# Patient Record
Sex: Female | Born: 1935 | Race: White | Hispanic: No | Smoking: Former smoker
Health system: Southern US, Community
[De-identification: ages and names within clinical notes are randomized; demographics above are authoritative.]

## PROBLEM LIST (undated history)

## (undated) DIAGNOSIS — J069 Acute upper respiratory infection, unspecified: Secondary | ICD-10-CM

## (undated) DIAGNOSIS — N764 Abscess of vulva: Secondary | ICD-10-CM

## (undated) DIAGNOSIS — K620 Anal polyp: Secondary | ICD-10-CM

## (undated) DIAGNOSIS — R002 Palpitations: Secondary | ICD-10-CM

## (undated) DIAGNOSIS — K219 Gastro-esophageal reflux disease without esophagitis: Secondary | ICD-10-CM

## (undated) DIAGNOSIS — I10 Essential (primary) hypertension: Secondary | ICD-10-CM

## (undated) DIAGNOSIS — M5030 Other cervical disc degeneration, unspecified cervical region: Secondary | ICD-10-CM

## (undated) DIAGNOSIS — E785 Hyperlipidemia, unspecified: Secondary | ICD-10-CM

## (undated) DIAGNOSIS — D1800 Hemangioma unspecified site: Secondary | ICD-10-CM

## (undated) DIAGNOSIS — R55 Syncope and collapse: Secondary | ICD-10-CM

## (undated) DIAGNOSIS — M8580 Other specified disorders of bone density and structure, unspecified site: Secondary | ICD-10-CM

## (undated) DIAGNOSIS — T7840XA Allergy, unspecified, initial encounter: Secondary | ICD-10-CM

## (undated) DIAGNOSIS — J019 Acute sinusitis, unspecified: Secondary | ICD-10-CM

## (undated) DIAGNOSIS — J029 Acute pharyngitis, unspecified: Secondary | ICD-10-CM

## (undated) DIAGNOSIS — I951 Orthostatic hypotension: Secondary | ICD-10-CM

## (undated) DIAGNOSIS — K621 Rectal polyp: Secondary | ICD-10-CM

## (undated) DIAGNOSIS — M1990 Unspecified osteoarthritis, unspecified site: Secondary | ICD-10-CM

## (undated) HISTORY — DX: Hemangioma unspecified site: D18.00

## (undated) HISTORY — DX: Orthostatic hypotension: I95.1

## (undated) HISTORY — DX: Gastro-esophageal reflux disease without esophagitis: K21.9

## (undated) HISTORY — PX: CATARACT EXTRACTION: SUR2

## (undated) HISTORY — DX: Acute pharyngitis, unspecified: J02.9

## (undated) HISTORY — DX: Anal polyp: K62.0

## (undated) HISTORY — DX: Anal polyp: K62.1

## (undated) HISTORY — PX: ABDOMINAL HYSTERECTOMY: SHX81

## (undated) HISTORY — DX: Allergy, unspecified, initial encounter: T78.40XA

## (undated) HISTORY — DX: Hyperlipidemia, unspecified: E78.5

## (undated) HISTORY — DX: Abscess of vulva: N76.4

## (undated) HISTORY — DX: Other specified disorders of bone density and structure, unspecified site: M85.80

## (undated) HISTORY — DX: Unspecified osteoarthritis, unspecified site: M19.90

## (undated) HISTORY — DX: Palpitations: R00.2

## (undated) HISTORY — DX: Essential (primary) hypertension: I10

## (undated) HISTORY — DX: Acute upper respiratory infection, unspecified: J06.9

## (undated) HISTORY — DX: Other cervical disc degeneration, unspecified cervical region: M50.30

## (undated) HISTORY — PX: TOTAL KNEE ARTHROPLASTY: SHX125

## (undated) HISTORY — PX: OTHER SURGICAL HISTORY: SHX169

## (undated) HISTORY — DX: Syncope and collapse: R55

## (undated) HISTORY — DX: Acute sinusitis, unspecified: J01.90

---

## 1998-09-30 ENCOUNTER — Ambulatory Visit (HOSPITAL_COMMUNITY): Admission: RE | Admit: 1998-09-30 | Discharge: 1998-09-30 | Payer: Self-pay | Admitting: Gastroenterology

## 2000-09-25 ENCOUNTER — Encounter: Admission: RE | Admit: 2000-09-25 | Discharge: 2000-12-24 | Payer: Self-pay | Admitting: Family Medicine

## 2001-12-20 ENCOUNTER — Encounter: Payer: Self-pay | Admitting: Orthopedic Surgery

## 2001-12-20 ENCOUNTER — Encounter: Admission: RE | Admit: 2001-12-20 | Discharge: 2001-12-20 | Payer: Self-pay | Admitting: Orthopedic Surgery

## 2001-12-24 ENCOUNTER — Ambulatory Visit (HOSPITAL_BASED_OUTPATIENT_CLINIC_OR_DEPARTMENT_OTHER): Admission: RE | Admit: 2001-12-24 | Discharge: 2001-12-24 | Payer: Self-pay | Admitting: Orthopedic Surgery

## 2004-04-09 ENCOUNTER — Ambulatory Visit: Payer: Self-pay | Admitting: Family Medicine

## 2004-04-22 ENCOUNTER — Encounter (INDEPENDENT_AMBULATORY_CARE_PROVIDER_SITE_OTHER): Payer: Self-pay | Admitting: Specialist

## 2004-04-22 ENCOUNTER — Ambulatory Visit (HOSPITAL_COMMUNITY): Admission: RE | Admit: 2004-04-22 | Discharge: 2004-04-22 | Payer: Self-pay | Admitting: Gastroenterology

## 2004-04-24 HISTORY — PX: OTHER SURGICAL HISTORY: SHX169

## 2004-05-06 ENCOUNTER — Ambulatory Visit: Payer: Self-pay | Admitting: Family Medicine

## 2004-05-21 ENCOUNTER — Inpatient Hospital Stay (HOSPITAL_COMMUNITY): Admission: EM | Admit: 2004-05-21 | Discharge: 2004-05-24 | Payer: Self-pay | Admitting: Emergency Medicine

## 2004-05-23 ENCOUNTER — Encounter (INDEPENDENT_AMBULATORY_CARE_PROVIDER_SITE_OTHER): Payer: Self-pay | Admitting: *Deleted

## 2004-06-22 LAB — HM DEXA SCAN

## 2004-06-28 ENCOUNTER — Other Ambulatory Visit: Admission: RE | Admit: 2004-06-28 | Discharge: 2004-06-28 | Payer: Self-pay | Admitting: Family Medicine

## 2004-06-28 ENCOUNTER — Ambulatory Visit: Payer: Self-pay | Admitting: Family Medicine

## 2004-09-27 ENCOUNTER — Ambulatory Visit: Payer: Self-pay | Admitting: Family Medicine

## 2004-11-18 ENCOUNTER — Ambulatory Visit: Payer: Self-pay | Admitting: Family Medicine

## 2004-11-22 ENCOUNTER — Encounter: Admission: RE | Admit: 2004-11-22 | Discharge: 2004-11-22 | Payer: Self-pay | Admitting: Family Medicine

## 2004-12-21 ENCOUNTER — Ambulatory Visit: Payer: Self-pay | Admitting: Family Medicine

## 2005-02-21 ENCOUNTER — Ambulatory Visit: Payer: Self-pay | Admitting: Family Medicine

## 2005-03-21 ENCOUNTER — Ambulatory Visit: Payer: Self-pay | Admitting: Family Medicine

## 2005-03-29 ENCOUNTER — Ambulatory Visit: Payer: Self-pay | Admitting: Family Medicine

## 2005-08-02 ENCOUNTER — Ambulatory Visit: Payer: Self-pay | Admitting: Family Medicine

## 2005-08-29 LAB — FECAL OCCULT BLOOD, GUAIAC: Fecal Occult Blood: NEGATIVE

## 2005-08-30 ENCOUNTER — Ambulatory Visit: Payer: Self-pay | Admitting: Family Medicine

## 2005-10-30 ENCOUNTER — Ambulatory Visit: Payer: Self-pay | Admitting: Family Medicine

## 2006-01-11 ENCOUNTER — Ambulatory Visit: Payer: Self-pay | Admitting: Family Medicine

## 2006-02-01 ENCOUNTER — Ambulatory Visit: Payer: Self-pay | Admitting: Family Medicine

## 2006-02-22 DIAGNOSIS — M5030 Other cervical disc degeneration, unspecified cervical region: Secondary | ICD-10-CM

## 2006-02-22 HISTORY — DX: Other cervical disc degeneration, unspecified cervical region: M50.30

## 2006-03-13 ENCOUNTER — Encounter: Admission: RE | Admit: 2006-03-13 | Discharge: 2006-03-13 | Payer: Self-pay | Admitting: Family Medicine

## 2006-03-13 ENCOUNTER — Ambulatory Visit: Payer: Self-pay | Admitting: Family Medicine

## 2006-04-18 ENCOUNTER — Ambulatory Visit: Payer: Self-pay | Admitting: Family Medicine

## 2006-04-19 ENCOUNTER — Ambulatory Visit: Payer: Self-pay | Admitting: Cardiology

## 2006-04-24 DIAGNOSIS — I951 Orthostatic hypotension: Secondary | ICD-10-CM

## 2006-04-24 HISTORY — DX: Orthostatic hypotension: I95.1

## 2006-04-25 ENCOUNTER — Inpatient Hospital Stay (HOSPITAL_COMMUNITY): Admission: EM | Admit: 2006-04-25 | Discharge: 2006-04-27 | Payer: Self-pay | Admitting: Emergency Medicine

## 2006-04-25 ENCOUNTER — Ambulatory Visit: Payer: Self-pay | Admitting: Cardiology

## 2006-04-26 ENCOUNTER — Encounter: Payer: Self-pay | Admitting: Cardiology

## 2006-04-26 ENCOUNTER — Encounter: Payer: Self-pay | Admitting: Vascular Surgery

## 2006-05-07 ENCOUNTER — Ambulatory Visit: Payer: Self-pay | Admitting: Family Medicine

## 2006-05-09 ENCOUNTER — Ambulatory Visit: Payer: Self-pay | Admitting: Cardiology

## 2006-06-18 ENCOUNTER — Ambulatory Visit: Payer: Self-pay | Admitting: Family Medicine

## 2006-07-30 ENCOUNTER — Encounter: Payer: Self-pay | Admitting: Family Medicine

## 2006-07-30 DIAGNOSIS — K621 Rectal polyp: Secondary | ICD-10-CM

## 2006-07-30 DIAGNOSIS — E785 Hyperlipidemia, unspecified: Secondary | ICD-10-CM | POA: Insufficient documentation

## 2006-07-30 DIAGNOSIS — D1800 Hemangioma unspecified site: Secondary | ICD-10-CM | POA: Insufficient documentation

## 2006-07-30 DIAGNOSIS — E119 Type 2 diabetes mellitus without complications: Secondary | ICD-10-CM | POA: Insufficient documentation

## 2006-07-30 DIAGNOSIS — I10 Essential (primary) hypertension: Secondary | ICD-10-CM

## 2006-07-30 DIAGNOSIS — Z8669 Personal history of other diseases of the nervous system and sense organs: Secondary | ICD-10-CM | POA: Insufficient documentation

## 2006-07-30 DIAGNOSIS — M1990 Unspecified osteoarthritis, unspecified site: Secondary | ICD-10-CM

## 2006-07-30 DIAGNOSIS — K219 Gastro-esophageal reflux disease without esophagitis: Secondary | ICD-10-CM | POA: Insufficient documentation

## 2006-07-30 DIAGNOSIS — K620 Anal polyp: Secondary | ICD-10-CM | POA: Insufficient documentation

## 2006-08-08 ENCOUNTER — Ambulatory Visit: Payer: Self-pay | Admitting: Cardiology

## 2006-09-05 ENCOUNTER — Ambulatory Visit: Payer: Self-pay | Admitting: Internal Medicine

## 2006-09-19 ENCOUNTER — Telehealth (INDEPENDENT_AMBULATORY_CARE_PROVIDER_SITE_OTHER): Payer: Self-pay | Admitting: *Deleted

## 2006-09-20 ENCOUNTER — Telehealth: Payer: Self-pay | Admitting: Family Medicine

## 2006-09-28 ENCOUNTER — Ambulatory Visit: Payer: Self-pay | Admitting: Family Medicine

## 2006-10-23 ENCOUNTER — Ambulatory Visit: Payer: Self-pay | Admitting: Family Medicine

## 2007-01-25 ENCOUNTER — Ambulatory Visit: Payer: Self-pay | Admitting: Family Medicine

## 2007-01-29 LAB — CONVERTED CEMR LAB
AST: 18 units/L (ref 0–37)
Albumin: 3.5 g/dL (ref 3.5–5.2)
BUN: 14 mg/dL (ref 6–23)
Chloride: 109 meq/L (ref 96–112)
Cholesterol: 149 mg/dL (ref 0–200)
GFR calc Af Amer: 77 mL/min
HDL: 52.6 mg/dL (ref >39.0)
Hgb A1c MFr Bld: 6.6 % — ABNORMAL HIGH (ref 4.6–6.0)
LDL Cholesterol: 80 mg/dL (ref 0–99)
Total CHOL/HDL Ratio: 2.8
VLDL: 16 mg/dL (ref 0–40)

## 2007-04-15 ENCOUNTER — Telehealth (INDEPENDENT_AMBULATORY_CARE_PROVIDER_SITE_OTHER): Payer: Self-pay | Admitting: *Deleted

## 2007-04-30 ENCOUNTER — Encounter: Payer: Self-pay | Admitting: Family Medicine

## 2007-05-08 ENCOUNTER — Ambulatory Visit: Payer: Self-pay | Admitting: Family Medicine

## 2007-05-11 ENCOUNTER — Telehealth: Payer: Self-pay | Admitting: Internal Medicine

## 2007-05-13 ENCOUNTER — Telehealth: Payer: Self-pay | Admitting: Family Medicine

## 2007-05-16 ENCOUNTER — Ambulatory Visit: Payer: Self-pay | Admitting: Family Medicine

## 2007-05-22 ENCOUNTER — Telehealth: Payer: Self-pay | Admitting: Family Medicine

## 2007-06-11 ENCOUNTER — Encounter: Payer: Self-pay | Admitting: Family Medicine

## 2007-06-20 ENCOUNTER — Ambulatory Visit: Payer: Self-pay | Admitting: Family Medicine

## 2007-06-26 LAB — CONVERTED CEMR LAB
ALT: 18 units/L (ref 0–35)
HDL: 55.6 mg/dL (ref >39.0)
LDL Cholesterol: 76 mg/dL (ref 0–99)
Triglycerides: 63 mg/dL (ref 0–149)
VLDL: 13 mg/dL (ref 0–40)

## 2007-07-12 ENCOUNTER — Encounter: Payer: Self-pay | Admitting: Family Medicine

## 2007-07-25 ENCOUNTER — Ambulatory Visit: Payer: Self-pay | Admitting: Family Medicine

## 2007-12-20 ENCOUNTER — Telehealth: Payer: Self-pay | Admitting: Family Medicine

## 2008-01-14 ENCOUNTER — Ambulatory Visit: Payer: Self-pay | Admitting: Family Medicine

## 2008-01-27 ENCOUNTER — Ambulatory Visit: Payer: Self-pay | Admitting: Family Medicine

## 2008-01-28 ENCOUNTER — Ambulatory Visit: Payer: Self-pay | Admitting: Cardiology

## 2008-01-29 ENCOUNTER — Telehealth: Payer: Self-pay | Admitting: Family Medicine

## 2008-01-29 LAB — CONVERTED CEMR LAB
ALT: 19 units/L (ref 0–35)
AST: 20 units/L (ref 0–37)
BUN: 15 mg/dL (ref 6–23)
Cholesterol: 136 mg/dL (ref 0–200)
Creatinine, Ser: 1 mg/dL (ref 0.4–1.2)
GFR calc Af Amer: 68 mL/min
GFR calc non Af Amer: 56 mL/min
Glucose, Bld: 115 mg/dL — ABNORMAL HIGH (ref 70–99)
Phosphorus: 3.5 mg/dL (ref 2.3–4.6)
Triglycerides: 77 mg/dL (ref 0–149)

## 2008-01-30 ENCOUNTER — Inpatient Hospital Stay (HOSPITAL_COMMUNITY): Admission: EM | Admit: 2008-01-30 | Discharge: 2008-01-31 | Payer: Self-pay | Admitting: Emergency Medicine

## 2008-01-30 ENCOUNTER — Encounter: Payer: Self-pay | Admitting: Family Medicine

## 2008-01-30 ENCOUNTER — Ambulatory Visit: Payer: Self-pay | Admitting: Internal Medicine

## 2008-01-31 ENCOUNTER — Ambulatory Visit: Payer: Self-pay | Admitting: Vascular Surgery

## 2008-01-31 ENCOUNTER — Encounter: Payer: Self-pay | Admitting: Internal Medicine

## 2008-01-31 ENCOUNTER — Encounter: Payer: Self-pay | Admitting: Family Medicine

## 2008-02-27 ENCOUNTER — Telehealth (INDEPENDENT_AMBULATORY_CARE_PROVIDER_SITE_OTHER): Payer: Self-pay | Admitting: *Deleted

## 2008-02-28 ENCOUNTER — Ambulatory Visit: Payer: Self-pay | Admitting: Family Medicine

## 2008-03-10 ENCOUNTER — Ambulatory Visit: Payer: Self-pay | Admitting: Family Medicine

## 2008-03-11 ENCOUNTER — Encounter: Payer: Self-pay | Admitting: Family Medicine

## 2008-04-03 ENCOUNTER — Telehealth: Payer: Self-pay | Admitting: Family Medicine

## 2008-04-10 ENCOUNTER — Telehealth: Payer: Self-pay | Admitting: Family Medicine

## 2008-05-14 ENCOUNTER — Ambulatory Visit: Payer: Self-pay | Admitting: Family Medicine

## 2008-05-14 LAB — CONVERTED CEMR LAB: Rapid Strep: NEGATIVE

## 2008-06-02 ENCOUNTER — Ambulatory Visit: Payer: Self-pay | Admitting: Family Medicine

## 2008-06-02 DIAGNOSIS — J309 Allergic rhinitis, unspecified: Secondary | ICD-10-CM

## 2008-06-15 ENCOUNTER — Encounter: Payer: Self-pay | Admitting: Family Medicine

## 2008-07-07 ENCOUNTER — Ambulatory Visit: Payer: Self-pay | Admitting: Family Medicine

## 2008-07-07 DIAGNOSIS — R002 Palpitations: Secondary | ICD-10-CM | POA: Insufficient documentation

## 2008-07-08 LAB — CONVERTED CEMR LAB
Basophils Absolute: 0 10*3/uL (ref 0.0–0.1)
Basophils Relative: 0.1 % (ref 0.0–3.0)
Eosinophils Absolute: 0.2 10*3/uL (ref 0.0–0.7)
Lymphs Abs: 1.8 10*3/uL (ref 0.7–4.0)
Monocytes Relative: 8.5 % (ref 3.0–12.0)
Neutro Abs: 7.4 10*3/uL (ref 1.4–7.7)
Neutrophils Relative %: 72 % (ref 43.0–77.0)
RBC: 4.44 M/uL (ref 3.87–5.11)

## 2008-07-22 ENCOUNTER — Telehealth (INDEPENDENT_AMBULATORY_CARE_PROVIDER_SITE_OTHER): Payer: Self-pay | Admitting: *Deleted

## 2008-10-01 ENCOUNTER — Ambulatory Visit: Payer: Self-pay | Admitting: Family Medicine

## 2008-10-08 ENCOUNTER — Ambulatory Visit: Payer: Self-pay | Admitting: Family Medicine

## 2008-12-03 ENCOUNTER — Encounter (INDEPENDENT_AMBULATORY_CARE_PROVIDER_SITE_OTHER): Payer: Self-pay | Admitting: *Deleted

## 2008-12-23 ENCOUNTER — Encounter: Payer: Self-pay | Admitting: Family Medicine

## 2009-01-08 ENCOUNTER — Ambulatory Visit: Payer: Self-pay | Admitting: Family Medicine

## 2009-01-21 ENCOUNTER — Encounter: Payer: Self-pay | Admitting: Family Medicine

## 2009-01-22 ENCOUNTER — Ambulatory Visit: Payer: Self-pay | Admitting: Family Medicine

## 2009-01-29 DIAGNOSIS — I951 Orthostatic hypotension: Secondary | ICD-10-CM

## 2009-02-10 ENCOUNTER — Ambulatory Visit: Payer: Self-pay | Admitting: Cardiology

## 2009-02-13 ENCOUNTER — Ambulatory Visit: Payer: Self-pay | Admitting: Cardiology

## 2009-02-13 ENCOUNTER — Inpatient Hospital Stay (HOSPITAL_COMMUNITY): Admission: EM | Admit: 2009-02-13 | Discharge: 2009-02-15 | Payer: Self-pay | Admitting: Emergency Medicine

## 2009-02-15 ENCOUNTER — Ambulatory Visit: Payer: Self-pay | Admitting: Vascular Surgery

## 2009-02-15 ENCOUNTER — Encounter (INDEPENDENT_AMBULATORY_CARE_PROVIDER_SITE_OTHER): Payer: Self-pay | Admitting: Internal Medicine

## 2009-02-26 ENCOUNTER — Ambulatory Visit: Payer: Self-pay | Admitting: Family Medicine

## 2009-02-26 ENCOUNTER — Encounter (INDEPENDENT_AMBULATORY_CARE_PROVIDER_SITE_OTHER): Payer: Self-pay | Admitting: *Deleted

## 2009-02-26 DIAGNOSIS — R413 Other amnesia: Secondary | ICD-10-CM

## 2009-03-02 ENCOUNTER — Encounter: Payer: Self-pay | Admitting: Family Medicine

## 2009-03-11 ENCOUNTER — Ambulatory Visit: Payer: Self-pay | Admitting: Family Medicine

## 2009-04-27 ENCOUNTER — Ambulatory Visit: Payer: Self-pay | Admitting: Family Medicine

## 2009-04-27 LAB — CONVERTED CEMR LAB
AST: 22 units/L (ref 0–37)
Albumin: 3.9 g/dL (ref 3.5–5.2)
BUN: 15 mg/dL (ref 6–23)
Cholesterol: 144 mg/dL (ref 0–200)
GFR calc non Af Amer: 56.1 mL/min (ref >60)
Glucose, Bld: 112 mg/dL — ABNORMAL HIGH (ref 70–99)
HDL: 53.2 mg/dL (ref >39.00)
Hgb A1c MFr Bld: 6.4 % (ref 4.6–6.5)
LDL Cholesterol: 73 mg/dL (ref 0–99)
Potassium: 4.8 meq/L (ref 3.5–5.1)
Total CHOL/HDL Ratio: 3
Total Protein: 7.1 g/dL (ref 6.0–8.3)
Triglycerides: 90 mg/dL (ref 0.0–149.0)

## 2009-04-30 ENCOUNTER — Ambulatory Visit: Payer: Self-pay | Admitting: Family Medicine

## 2009-05-25 ENCOUNTER — Encounter: Payer: Self-pay | Admitting: Family Medicine

## 2009-06-21 ENCOUNTER — Encounter: Payer: Self-pay | Admitting: Family Medicine

## 2009-07-27 ENCOUNTER — Ambulatory Visit: Payer: Self-pay | Admitting: Family Medicine

## 2009-07-30 ENCOUNTER — Ambulatory Visit: Payer: Self-pay | Admitting: Family Medicine

## 2009-08-06 ENCOUNTER — Telehealth (INDEPENDENT_AMBULATORY_CARE_PROVIDER_SITE_OTHER): Payer: Self-pay | Admitting: *Deleted

## 2009-10-19 ENCOUNTER — Ambulatory Visit: Payer: Self-pay | Admitting: Family Medicine

## 2009-10-26 LAB — CONVERTED CEMR LAB
ALT: 16 units/L (ref 0–35)
Albumin: 3.8 g/dL (ref 3.5–5.2)
Alkaline Phosphatase: 52 units/L (ref 39–117)
BUN: 18 mg/dL (ref 6–23)
Basophils Relative: 0.3 % (ref 0.0–3.0)
CO2: 29 meq/L (ref 19–32)
Calcium: 9.3 mg/dL (ref 8.4–10.5)
Creatinine,U: 113.8 mg/dL
Eosinophils Absolute: 0.2 10*3/uL (ref 0.0–0.7)
Eosinophils Relative: 3.4 % (ref 0.0–5.0)
GFR calc non Af Amer: 53.56 mL/min (ref >60)
Lymphocytes Relative: 21.8 % (ref 12.0–46.0)
Microalb, Ur: 3.7 mg/dL — ABNORMAL HIGH (ref 0.0–1.9)
Monocytes Relative: 9.6 % (ref 3.0–12.0)
Neutro Abs: 4.8 10*3/uL (ref 1.4–7.7)
Neutrophils Relative %: 64.9 % (ref 43.0–77.0)
RDW: 13.9 % (ref 11.5–14.6)
Total Bilirubin: 0.5 mg/dL (ref 0.3–1.2)
Total CHOL/HDL Ratio: 3

## 2009-10-29 ENCOUNTER — Ambulatory Visit: Payer: Self-pay | Admitting: Family Medicine

## 2009-10-29 DIAGNOSIS — M810 Age-related osteoporosis without current pathological fracture: Secondary | ICD-10-CM

## 2009-11-04 ENCOUNTER — Encounter: Payer: Self-pay | Admitting: Family Medicine

## 2009-11-12 ENCOUNTER — Ambulatory Visit: Payer: Self-pay | Admitting: Family Medicine

## 2009-11-12 LAB — CONVERTED CEMR LAB
Alkaline Phosphatase: 48 units/L (ref 39–117)
BUN: 18 mg/dL (ref 6–23)
Calcium: 9.3 mg/dL (ref 8.4–10.5)
Chloride: 113 meq/L — ABNORMAL HIGH (ref 96–112)
Creatinine,U: 120.9 mg/dL
LDL Cholesterol: 68 mg/dL (ref 0–99)
Potassium: 4.4 meq/L (ref 3.5–5.1)
Sodium: 144 meq/L (ref 135–145)
Total Bilirubin: 0.8 mg/dL (ref 0.3–1.2)
Total Protein: 6.7 g/dL (ref 6.0–8.3)
Triglycerides: 84 mg/dL (ref 0.0–149.0)
VLDL: 16.8 mg/dL (ref 0.0–40.0)

## 2009-11-12 LAB — HM MAMMOGRAPHY: HM Mammogram: NORMAL

## 2009-11-13 ENCOUNTER — Encounter: Payer: Self-pay | Admitting: Family Medicine

## 2009-11-26 ENCOUNTER — Ambulatory Visit: Payer: Self-pay | Admitting: Family Medicine

## 2009-12-08 ENCOUNTER — Telehealth: Payer: Self-pay | Admitting: Family Medicine

## 2009-12-10 ENCOUNTER — Telehealth (INDEPENDENT_AMBULATORY_CARE_PROVIDER_SITE_OTHER): Payer: Self-pay | Admitting: *Deleted

## 2009-12-13 ENCOUNTER — Ambulatory Visit: Payer: Self-pay | Admitting: Family Medicine

## 2009-12-13 DIAGNOSIS — M1A9XX0 Chronic gout, unspecified, without tophus (tophi): Secondary | ICD-10-CM

## 2010-02-09 ENCOUNTER — Ambulatory Visit: Payer: Self-pay | Admitting: Family Medicine

## 2010-03-02 ENCOUNTER — Ambulatory Visit: Payer: Self-pay | Admitting: Family Medicine

## 2010-03-04 ENCOUNTER — Ambulatory Visit: Payer: Self-pay | Admitting: Family Medicine

## 2010-04-04 ENCOUNTER — Ambulatory Visit: Payer: Self-pay | Admitting: Family Medicine

## 2010-04-20 ENCOUNTER — Telehealth: Payer: Self-pay | Admitting: Family Medicine

## 2010-04-28 ENCOUNTER — Other Ambulatory Visit: Payer: Self-pay | Admitting: Family Medicine

## 2010-04-28 ENCOUNTER — Ambulatory Visit
Admission: RE | Admit: 2010-04-28 | Discharge: 2010-04-28 | Payer: Self-pay | Source: Home / Self Care | Attending: Family Medicine | Admitting: Family Medicine

## 2010-04-28 LAB — HEMOGLOBIN A1C: Hgb A1c MFr Bld: 6.7 % — ABNORMAL HIGH (ref 4.6–6.5)

## 2010-05-10 ENCOUNTER — Ambulatory Visit: Admit: 2010-05-10 | Payer: Self-pay | Admitting: Family Medicine

## 2010-05-24 ENCOUNTER — Ambulatory Visit
Admission: RE | Admit: 2010-05-24 | Discharge: 2010-05-24 | Payer: Self-pay | Source: Home / Self Care | Attending: Family Medicine | Admitting: Family Medicine

## 2010-05-24 NOTE — Assessment & Plan Note (Signed)
Summary: CHECK LESION ON ABD   Vital Signs:  Patient profile:   77 year old female Height:      66 inches Weight:      144.0 pounds BMI:     23.33 Temp:     98.0 degrees F oral Pulse rate:   72 / minute Pulse rhythm:   regular BP sitting:   140 / 72  (left arm) Cuff size:   regular  Vitals Entered By: Benny Lennert CMA Duncan Dull) (March 02, 2010 3:12 PM)  History of Present Illness: Chief complaint Check lesion on abdomen  In last 8-9 years noted lesion on left lower stomache.  Over the last few days noted redness around site, firmness under leion.  No itch, no bleeding, no pain.    Problems Prior to Update: 1)  Neck Pain  (ICD-723.1) 2)  Gout, Unspecified  (ICD-274.9) 3)  Altered Mental Status  (ICD-780.97) 4)  Preoperative Examination  (ICD-V72.84) 5)  Osteoporosis  (ICD-733.00) 6)  Other Screening Mammogram  (ICD-V76.12) 7)  Memory Loss  (ICD-780.93) 8)  Syncope/ 1 Episode  (ICD-780.2) 9)  Hypotension, Orthostatic  (ICD-458.0) 10)  Palpitations, Occasional  (ICD-785.1) 11)  Hyperlipidemia  (ICD-272.4) 12)  Hypertension  (ICD-401.9) 13)  Diabetes Mellitus, Type II  (ICD-250.00) 14)  Gerd  (ICD-530.81) 15)  Allergic Rhinitis  (ICD-477.9) 16)  Pharyngitis  (ICD-462) 17)  Other Abscess of Vulva  (ICD-616.4) 18)  Sinusitis- Acute-nos  (ICD-461.9) 19)  Uri  (ICD-465.9) 20)  Hx of Hemangioma  (ICD-228.00) 21)  Rectal Polyps  (ICD-569.0) 22)  Cataract, Hx of  (ICD-V12.40) 23)  Osteoarthritis  (ICD-715.90) 24)  Gout  (ICD-274.9)  Current Medications (verified): 1)  Zocor 40 Mg  Tabs (Simvastatin) .Marland Kitchen.. 1 By Mouth Once Daily 2)  Altace 2.5 Mg Caps (Ramipril) .... Take One By Mouth Daily 3)  Glyburide 2.5 Mg Tabs (Glyburide) .... Take One By Mouth Twice Daily As Directed- As Needed If Glucose Is Over 200 4)  Bayer Aspirin 325 Mg  Tabs (Aspirin) .... 1/2 Tab Every Day 5)  Omeprazole 20 Mg Cpdr (Omeprazole) .... 2 Tabs By Mouth Dialy 6)  Milk of Magnesia .... Take 1  Tablet By Mouth Once A Day, 3-4 Times Per Week. 7)  Alendronate Sodium 70 Mg Tabs (Alendronate Sodium) .Marland Kitchen.. 1 Tab By Mouth Weekly 8)  Keppra 250 Mg Tabs (Levetiracetam) .Marland Kitchen.. 1 Tab By Mouth Two Times A Day X 6 Months  Allergies: 1)  ! Codeine 2)  ! Crestor 3)  ! Lipitor  Past History:  Past medical, surgical, family and social histories (including risk factors) reviewed, and no changes noted (except as noted below).  Past Medical History: Reviewed history from 01/29/2009 and no changes required. SYNCOPE/ 1 EPISODE (ICD-780.2) HYPOTENSION, ORTHOSTATIC (ICD-458.0) PALPITATIONS, OCCASIONAL (ICD-785.1) HYPERLIPIDEMIA (ICD-272.4) HYPERTENSION (ICD-401.9) DIABETES MELLITUS, TYPE II (ICD-250.00) GERD (ICD-530.81) ALLERGIC RHINITIS (ICD-477.9) PHARYNGITIS (ICD-462) OTHER ABSCESS OF VULVA (ICD-616.4) SINUSITIS- ACUTE-NOS (ICD-461.9) URI (ICD-465.9) Hx of HEMANGIOMA (ICD-228.00) RECTAL POLYPS (ICD-569.0) CATARACT, HX OF (ICD-V12.40) OSTEOPENIA (ICD-733.90) OSTEOARTHRITIS (ICD-715.90) GOUT (ICD-274.9)    Past Surgical History: Reviewed history from 07/27/2007 and no changes required. Hysterectomy Cataract extraction Heel dexa- normal (2003) Colonoscopy- (2001), rectal polyps (04/2004) Hospital- cavernous hemangioma (04/2004) 2D Echo EF 55-65% (04/2004) Dexa- osteopenia T-1.2 (06/2004) Cavernous angiooma Cervical deg. changes (02/2006) Hospital- pre syncope- orthostatic hypertension (04/2006) Aortic dopplers- mild plaque.  EEG o.k.   D Echo- normal.  EF 55-60% MRI of head- stable R TKR 5/08 colonoscopy 3/09 polyp and diverticulosis  Family  History: Reviewed history from 01/29/2009 and no changes required. M aunt breast ca Mother deceased at age 86, unknown cause.  Father deceased at age 45 secondary to CVA.  Four siblings deceased from CHF and old age.  Social History: Reviewed history from 01/29/2009 and no changes required. 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  Denies ever using tobacco, EtOH, drugs or herbal  medication.  No diet restrictions.  Activity limited secondary to knee discomfort/arthritis.  Review of Systems General:  Denies fatigue and fever. CV:  Denies chest pain or discomfort. Resp:  Denies shortness of breath.  Physical Exam  General:  Well-developed,well-nourished,in no acute distress; alert,appropriate and cooperative throughout examination Mouth:  MMM Lungs:  Normal respiratory effort, chest expands symmetrically. Lungs are clear to auscultation, no crackles or wheezes. Heart:  Normal rate and regular rhythm. S1 and S2 normal without gallop, murmur, click, rub or other extra sounds. Skin:  sebaceous core with underlying cyst, surrounding erythema...removed core with scapel, culture taken, pus discharged.    Impression & Recommendations:  Problem # 1:  SEBACEOUS CYST, INFECTED (ICD-706.2)  Warm compresses 2-3 times daily.  Apply bandiad with  neopsrin. Take 7 day course of keflex.  Follow up in 2 days.   Orders: T-Culture, Wound ([id number])  Complete Medication List: 1)  Zocor 40 Mg Tabs (Simvastatin) .Marland Kitchen.. 1 by mouth once daily 2)  Altace 2.5 Mg Caps (Ramipril) .... Take one by mouth daily 3)  Glyburide 2.5 Mg Tabs (Glyburide) .... Take one by mouth twice daily as directed- as needed if glucose is over 200 4)  Bayer Aspirin 325 Mg Tabs (Aspirin) .... 1/2 tab every day 5)  Omeprazole 20 Mg Cpdr (Omeprazole) .... 2 tabs by mouth dialy 6)  Milk of Magnesia  .... Take 1 tablet by mouth once a day, 3-4 times per week. 7)  Alendronate Sodium 70 Mg Tabs (Alendronate sodium) .Marland Kitchen.. 1 tab by mouth weekly 8)  Keppra 250 Mg Tabs (Levetiracetam) .Marland Kitchen.. 1 tab by mouth two times a day x 6 months 9)  Cephalexin 500 Mg Caps (Cephalexin) .Marland Kitchen.. 1 tab by mouth three times a day x 7 days  Patient Instructions: 1)  Warm compresses 2-3 times daily. 2)   Apply bandiad  with  neopsrin. 3)   Take 7 day course of keflex.  4)  Follow up in 2 days...add on. .  Prescriptions: CEPHALEXIN 500 MG CAPS (CEPHALEXIN) 1 tab by mouth three times a day x 7 days  #21 x 0   Entered and Authorized by:   Kerby Nora MD   Signed by:   Kerby Nora MD on 03/02/2010   Method used:   Electronically to        [facility]* (retail)       [address] [place], [place]  [address]       Ph: [phone] or [phone]       Fax: [phone]   RxID:   [id number]    Orders Added: 1)  Est. Patient Level III [03474] 2)  T-Culture, Wound [[id number]]    Current Allergies (reviewed today): ! CODEINE ! CRESTOR ! LIPITOR

## 2010-05-24 NOTE — Progress Notes (Signed)
Summary: wants something for gout flare up  Phone Note Call from Patient   Caller: Patient Call For: Kerby Nora MD Summary of Call: Pt states she woke up with gout flare up in right wrist this morning.  She is asking if something can be called to [facility].  She is going to take tylenol or advil for the pain. Initial call taken by: Lowella Petties CMA,  December 08, 2009 8:28 AM  Follow-up for Phone Call        Call if not improving.  Follow-up by: Kerby Nora MD,  December 08, 2009 3:24 PM  Additional Follow-up for Phone Call Additional follow up Details #1::        Patient advised.Consuello Masse CMA   Additional Follow-up by: Benny Lennert CMA Duncan Dull),  December 08, 2009 3:37 PM    New/Updated Medications: COLCRYS 0.6 MG TABS (COLCHICINE) 2 tab by mouth x1 , then 1 tab by mouth every 2 hours until pain gone, diarrhea or reach max of 10 in 24 hours Prescriptions: COLCRYS 0.6 MG TABS (COLCHICINE) 2 tab by mouth x1 , then 1 tab by mouth every 2 hours until pain gone, diarrhea or reach max of 10 in 24 hours  #10 x 0   Entered and Authorized by:   Kerby Nora MD   Signed by:   Kerby Nora MD on 12/08/2009   Method used:   Electronically to        [facility]* (retail)       [address] [place], [place]  [address]       Ph: [phone] or [phone]       Fax: [phone]   RxID:   [id number]

## 2010-05-24 NOTE — Letter (Signed)
Summary: [facility]  [facility]   Imported By: Lanelle Bal 05/31/2009 12:10:09  _____________________________________________________________________  External Attachment:    Type:   Image     Comment:   External Document

## 2010-05-24 NOTE — Assessment & Plan Note (Signed)
Summary: SURGICAL CLEARANCE FOR KNEE REPLACEMENT   Vital Signs:  Patient profile:   77 year old female Height:      66 inches Weight:      146.6 pounds BMI:     23.75 Temp:     98.4 degrees F oral Pulse rate:   76 / minute Pulse rhythm:   regular BP sitting:   130 / 80  (left arm) Cuff size:   regular  Vitals Entered By: Benny Lennert CMA Duncan Dull) (November 26, 2009 9:57 AM)  History of Present Illness: Chief complaint surgical clearence  Seeing Dr. Valentina Gu for left knee pain..Pt already notified per report.  severe OA in right knee. She wants myopinion on whether she would do well with surgery.  Pain in knee is intermitttant but not severe everyday.  In last month..she had a spell where she sat through a light ..lady came out of care to tell her it had changed twice.  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  Past syncopal events/blanking out spells. Neg cards eval/EKG.  neg neuro eval... EEG nml...Dr. Pearlean Brownie. Still with intermittant spells of "feeling like she cannot get her breath/palpitations"..seems to occur when leaning over. CBC and Tyroid NML 01/2009..MRI.MRA...no acute infact  MRA neck...no carotid or vertebral stenosis  ECHo showed nml EF, no clear embolic source.    Has hx if knee replacement, right   Last surgery 4 years ago, no complications.   Problems Prior to Update: 1)  Osteoporosis  (ICD-733.00) 2)  Other Screening Mammogram  (ICD-V76.12) 3)  Memory Loss  (ICD-780.93) 4)  Syncope/ 1 Episode  (ICD-780.2) 5)  Hypotension, Orthostatic  (ICD-458.0) 6)  Palpitations, Occasional  (ICD-785.1) 7)  Hyperlipidemia  (ICD-272.4) 8)  Hypertension  (ICD-401.9) 9)  Diabetes Mellitus, Type II  (ICD-250.00) 10)  Gerd  (ICD-530.81) 11)  Allergic Rhinitis  (ICD-477.9) 12)  Pharyngitis  (ICD-462) 13)  Other Abscess of Vulva  (ICD-616.4) 14)  Sinusitis- Acute-nos  (ICD-461.9) 15)  Uri  (ICD-465.9) 16)  Hx of  Hemangioma  (ICD-228.00) 17)  Rectal Polyps  (ICD-569.0) 18)  Cataract, Hx of  (ICD-V12.40) 19)  Osteoarthritis  (ICD-715.90) 20)  Gout  (ICD-274.9)  Current Medications (verified): 1)  Zocor 40 Mg  Tabs (Simvastatin) .Marland Kitchen.. 1 By Mouth Once Daily 2)  Altace 2.5 Mg Caps (Ramipril) .... Take One By Mouth Daily 3)  Glyburide 2.5 Mg Tabs (Glyburide) .... Take One By Mouth Twice Daily As Directed- As Needed If Glucose Is Over 200 4)  Bayer Aspirin 325 Mg  Tabs (Aspirin) .... 1/2 Tab Every Day 5)  Omeprazole 20 Mg Cpdr (Omeprazole) .... 2 Tabs By Mouth Dialy 6)  Milk of Magnesia .... Take 1 Tablet By Mouth Once A Day, 3-4 Times Per Week. 7)  Meloxicam 7.5 Mg Tabs (Meloxicam) .Marland Kitchen.. 1-2 Tab By Mouth Daily As Needed Ankle Pain 8)  Alendronate Sodium 70 Mg Tabs (Alendronate Sodium) .Marland Kitchen.. 1 Tab By Mouth Weekly  Allergies: 1)  ! Codeine 2)  ! Crestor 3)  ! Lipitor  Past History:  Past medical, surgical, family and social histories (including risk factors) reviewed, and no changes noted (except as noted below).  Past Medical History: Reviewed history from 01/29/2009 and no changes required. SYNCOPE/ 1 EPISODE (ICD-780.2) HYPOTENSION, ORTHOSTATIC (ICD-458.0) PALPITATIONS, OCCASIONAL (ICD-785.1) HYPERLIPIDEMIA (ICD-272.4) HYPERTENSION (ICD-401.9) DIABETES MELLITUS, TYPE II (ICD-250.00) GERD (ICD-530.81) ALLERGIC RHINITIS (ICD-477.9) PHARYNGITIS (ICD-462) OTHER ABSCESS OF VULVA (ICD-616.4) SINUSITIS- ACUTE-NOS (ICD-461.9) URI (  ICD-465.9) Hx of HEMANGIOMA (ICD-228.00) RECTAL POLYPS (ICD-569.0) CATARACT, HX OF (ICD-V12.40) OSTEOPENIA (ICD-733.90) OSTEOARTHRITIS (ICD-715.90) GOUT (ICD-274.9)    Past Surgical History: Reviewed history from 07/27/2007 and no changes required. Hysterectomy Cataract extraction Heel dexa- normal (2003) Colonoscopy- (2001), rectal polyps (04/2004) Hospital- cavernous hemangioma (04/2004) 2D Echo EF 55-65% (04/2004) Dexa- osteopenia T-1.2  (06/2004) Cavernous angiooma Cervical deg. changes (02/2006) Hospital- pre syncope- orthostatic hypertension (04/2006) Aortic dopplers- mild plaque.  EEG o.k.   D Echo- normal.  EF 55-60% MRI of head- stable R TKR 5/08 colonoscopy 3/09 polyp and diverticulosis  Family History: Reviewed history from 01/29/2009 and no changes required. M aunt breast ca Mother deceased at age 37, unknown cause.  Father deceased at age 7 secondary to CVA.  Four siblings deceased from CHF and old age.  Social History: Reviewed history from 01/29/2009 and no changes required. 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  Denies ever using tobacco, EtOH, drugs or herbal  medication.  No diet restrictions.  Activity limited secondary to knee discomfort/arthritis.  Review of Systems General:  Denies fatigue and fever. GI:  Denies abdominal pain. GU:  Denies dysuria. Derm:  Denies rash. Psych:  Denies anxiety and depression.  Physical Exam  General:  Well-developed,well-nourished,in no acute distress; alert,appropriate and cooperative throughout examination Mouth:  Oral mucosa and oropharynx without lesions or exudates.  Teeth in good repair. Neck:  no carotid bruit or thyromegaly no cervical or supraclavicular lymphadenopathy  Lungs:  Normal respiratory effort, chest expands symmetrically. Lungs are clear to auscultation, no crackles or wheezes. Heart:  Normal rate and regular rhythm. S1 and S2 normal without gallop, murmur, click, rub or other extra sounds. Pulses:  R and L posterior tibial pulses are full and equal bilaterally  Extremities:  no edema  Diabetes Management Exam:    Foot Exam (with socks and/or shoes not present):       Sensory-Pinprick/Light touch:          Left medial foot (L-4): normal          Left dorsal foot (L-5): normal          Left lateral foot (S-1): normal          Right medial foot (L-4): normal          Right  dorsal foot (L-5): normal          Right lateral foot (S-1): normal       Sensory-Monofilament:          Left foot: normal          Right foot: normal       Inspection:          Left foot: normal          Right foot: normal       Nails:          Left foot: normal          Right foot: normal   Impression & Recommendations:  Problem # 1:  PREOPERATIVE EXAMINATION (ICD-V72.84) Needs cardiovascular clearance from her cardiologist...may not need to be seen since recent evaluation/EKG. I discussed concering starting episodes with her Neurologist in detail as well. ...detailed below. recent labs nml..would recommend preop CXR as well.  Will forward recent CPX and labs to Cardiologist.   Problem # 2:  ALTERED MENTAL STATUS (ICD-780.97) Staring spells continuing.Marland Kitchenabout 1-2 times per month. need to contact her NEuro to discuss concern with upcoming  surgery.   Discussed concern of TIA vs complex partial seizures in detail with Dr. Pearlean Brownie on 8/16.  EEG neg, but can be neg and still have seizure do.  He recommended starting Keprra two times a day...following to fdetermine if spells decreased. He requested that we have her follow up in next few weeks.  He did not feel her ongoing issues we a contraindication to knee replacement surgery.   Problem # 3:  HYPERTENSION (ICD-401.9)  Well controlled. Continue current medication.  Her updated medication list for this problem includes:    Altace 2.5 Mg Caps (Ramipril) .Marland Kitchen... Take one by mouth daily  BP today: 130/80 Prior BP: 116/80 (11/12/2009)  Prior 10 Yr Risk Heart Disease: 13 % (04/30/2009)  Labs Reviewed: K+: 4.4 (10/19/2009) Creat: : 1.0 (10/19/2009)   Chol: 140 (10/19/2009)   HDL: 46.00 (10/19/2009)   LDL: 78 (10/19/2009)   TG: 82.0 (10/19/2009)  Problem # 4:  DIABETES MELLITUS, TYPE II (ICD-250.00) Well controlled. Continue current medication.  Her updated medication list for this problem includes:    Altace 2.5 Mg Caps (Ramipril)  .Marland Kitchen... Take one by mouth daily    Glyburide 2.5 Mg Tabs (Glyburide) .Marland Kitchen... Take one by mouth twice daily as directed- as needed if glucose is over 200    Bayer Aspirin 325 Mg Tabs (Aspirin) .Marland Kitchen... 1/2 tab every day  Problem # 5:  SYNCOPE/ 1 EPISODE (ICD-780.2) Assessment: Improved no furter episodes.   Complete Medication List: 1)  Zocor 40 Mg Tabs (Simvastatin) .Marland Kitchen.. 1 by mouth once daily 2)  Altace 2.5 Mg Caps (Ramipril) .... Take one by mouth daily 3)  Glyburide 2.5 Mg Tabs (Glyburide) .... Take one by mouth twice daily as directed- as needed if glucose is over 200 4)  Bayer Aspirin 325 Mg Tabs (Aspirin) .... 1/2 tab every day 5)  Omeprazole 20 Mg Cpdr (Omeprazole) .... 2 tabs by mouth dialy 6)  Milk of Magnesia  .... Take 1 tablet by mouth once a day, 3-4 times per week. 7)  Meloxicam 7.5 Mg Tabs (Meloxicam) .Marland Kitchen.. 1-2 tab by mouth daily as needed ankle pain 8)  Alendronate Sodium 70 Mg Tabs (Alendronate sodium) .Marland Kitchen.. 1 tab by mouth weekly  Patient Instructions: 1)  We will contact you regarding our discussion with neurologist and ORTHO.   Current Allergies (reviewed today): ! CODEINE ! CRESTOR ! LIPITOR  Appended Document: Orders Update Medications Added KEPPRA 250 MG TABS (LEVETIRACETAM) 1 tab by mouth two times a day x 6 months       Discussed with pt in detail Dr. Marlis Edelson recommendations of starting Keprra 250 mg two times a day x 6 months. Sent to pharm.  She will call to make a follow up appt with Dr. Pearlean Brownie in next 1-2 months.   She plans on holidng off on knee surgery for the time being given her knee is pain free.  If she does decides to move forward with the procedure..Dr. Pearlean Brownie feels she is okay to procede from a neuro standpoint.  Also she has had eval with Dr. Daleen Squibb in last 6 months.Marland KitchenMarland KitchenI asked her to call to make sure he felt she was clear for surgery from CV standpoint prior to proceeding; but she has not had any past CV issues.Marland Kitchenso I believe she will be clear.  Otherwise  other health issues stable...and otherwise clear for surgery.   Please forward this OV note and this addendum to Dr. Valentina Gu, [facility]ih.  Kerby Nora MD  December 07, 2009 5:37 PM  Clinical Lists Changes  Medications: Added new medication of KEPPRA 250 MG TABS (LEVETIRACETAM) 1 tab by mouth two times a day x 6 months - Signed Rx of KEPPRA 250 MG TABS (LEVETIRACETAM) 1 tab by mouth two times a day x 6 months;  #60 x 5;  Signed;  Entered by: Kerby Nora MD;  Authorized by: Kerby Nora MD;  Method used: Electronically to [facility]*, [address] [place], [place]  [address], Ph: [phone] or [phone], Fax: [phone]    Prescriptions: KEPPRA 250 MG TABS (LEVETIRACETAM) 1 tab by mouth two times a day x 6 months  #60 x 5   Entered and Authorized by:   Kerby Nora MD   Signed by:   Kerby Nora MD on 12/07/2009   Method used:   Electronically to        [facility]* (retail)       [address] [place], [place]  [address]       Ph: [phone] or [phone]       Fax: [phone]   RxID:   [id number]   Faxed to dr Hebert Soho office.Consuello Masse CMA

## 2010-05-24 NOTE — Assessment & Plan Note (Signed)
Summary: 2 DAY F/U DLO   Vital Signs:  Patient profile:   77 year old female Height:      66 inches Weight:      143.0 pounds BMI:     23.16 Temp:     97.8 degrees F oral Pulse rate:   72 / minute Pulse rhythm:   regular BP sitting:   110 / 70  (left arm) Cuff size:   regular  Vitals Entered By: Benny Lennert CMA Duncan Dull) (March 04, 2010 12:04 PM)  History of Present Illness: Chief complaint 2 day follow up   She returns for 2 day follow up on infected sebaceous cyst on left lower abdomen..on day 2 of keflex. Doing warm soaks at home.    Doing well..no pain, minimal discharge, decrease redness at site.    Allergies: 1)  ! Codeine 2)  ! Crestor 3)  ! Lipitor  Review of Systems General:  Denies fatigue and fever. CV:  Denies chest pain or discomfort. Resp:  Denies shortness of breath. GI:  Denies abdominal pain.  Physical Exam  General:  Well-developed,well-nourished,in no acute distress; alert,appropriate and cooperative throughout examination Mouth:  MMM Lungs:  Normal respiratory effort, chest expands symmetrically. Lungs are clear to auscultation, no crackles or wheezes. Heart:  Normal rate and regular rhythm. S1 and S2 normal without gallop, murmur, click, rub or other extra sounds. Skin:  decrease erythmea at site of still open wound, clear drainage.    Impression & Recommendations:  Problem # 1:  SEBACEOUS CYST, INFECTED (ICD-706.2) Improving..complete antibiotic course. Continue warms soaks. Call if redness spreading, not tolerating by mouth or fever on antibiotics.   Complete Medication List: 1)  Zocor 40 Mg Tabs (Simvastatin) .Marland Kitchen.. 1 by mouth once daily 2)  Altace 2.5 Mg Caps (Ramipril) .... Take one by mouth daily 3)  Glyburide 2.5 Mg Tabs (Glyburide) .... Take one by mouth twice daily as directed- as needed if glucose is over 200 4)  Bayer Aspirin 325 Mg Tabs (Aspirin) .... 1/2 tab every day 5)  Omeprazole 20 Mg Cpdr (Omeprazole) .... 2 tabs by  mouth dialy 6)  Milk of Magnesia  .... Take 1 tablet by mouth once a day, 3-4 times per week. 7)  Alendronate Sodium 70 Mg Tabs (Alendronate sodium) .Marland Kitchen.. 1 tab by mouth weekly 8)  Keppra 250 Mg Tabs (Levetiracetam) .Marland Kitchen.. 1 tab by mouth two times a day x 6 months 9)  Cephalexin 500 Mg Caps (Cephalexin) .Marland Kitchen.. 1 tab by mouth three times a day x 7 days   Orders Added: 1)  Est. Patient Level II [16109]    Current Allergies (reviewed today): ! CODEINE ! CRESTOR ! LIPITOR

## 2010-05-24 NOTE — Assessment & Plan Note (Signed)
Summary: F/U AFTER LABS / LFW   Vital Signs:  Patient profile:   77 year old female Weight:      151 pounds Temp:     98.3 degrees F oral Pulse rate:   76 / minute Pulse rhythm:   regular BP sitting:   124 / 72  (left arm) Cuff size:   regular  Vitals Entered By: Linde Gillis CMA [facility] Dull) (April 30, 2009 2:21 PM) CC: 3 month follow up, Hypertension Management   History of Present Illness: HTN, well controlled  DM, well controlled on diet and low dose glyburide as needed.  Has not been sticking to diet lately, plans to restart exercise.  Yesterday 2 hours post prandial  200.Marland Kitchentook glyburide..2 hours later cbg 80.   High cholesterol, well controlled.   No further memory loss/presyncopal spells.   Hypertension History:      She complains of palpitations, but denies headache, chest pain, dyspnea with exertion, peripheral edema, visual symptoms, neurologic problems, and syncope.  Not checking at home. Marland Kitchen        Positive major cardiovascular risk factors include female age 44 years old or older, diabetes, hyperlipidemia, and hypertension.     Problems Prior to Update: 1)  Memory Loss  (ICD-780.93) 2)  Syncope/ 1 Episode  (ICD-780.2) 3)  Hypotension, Orthostatic  (ICD-458.0) 4)  Palpitations, Occasional  (ICD-785.1) 5)  Hyperlipidemia  (ICD-272.4) 6)  Hypertension  (ICD-401.9) 7)  Diabetes Mellitus, Type II  (ICD-250.00) 8)  Gerd  (ICD-530.81) 9)  Allergic Rhinitis  (ICD-477.9) 10)  Pharyngitis  (ICD-462) 11)  Other Abscess of Vulva  (ICD-616.4) 12)  Sinusitis- Acute-nos  (ICD-461.9) 13)  Uri  (ICD-465.9) 14)  Hx of Hemangioma  (ICD-228.00) 15)  Rectal Polyps  (ICD-569.0) 16)  Cataract, Hx of  (ICD-V12.40) 17)  Osteopenia  (ICD-733.90) 18)  Osteoarthritis  (ICD-715.90) 19)  Gout  (ICD-274.9)  Current Medications (verified): 1)  Zocor 40 Mg  Tabs (Simvastatin) .Marland Kitchen.. 1 By Mouth Once Daily 2)  Altace 2.5 Mg Caps (Ramipril) .... Take One By Mouth Daily 3)  Glyburide  2.5 Mg Tabs (Glyburide) .... Take One By Mouth Twice Daily As Directed- As Needed If Glucose Is Over 200 4)  Bayer Aspirin 325 Mg  Tabs (Aspirin) .... 1/2 Tab Every Day 5)  Omeprazole 20 Mg Cpdr (Omeprazole) .... 2 Tabs By Mouth Dialy 6)  Milk of Magnesia .... Take 1 Tablet By Mouth Once A Day, 3-4 Times Per Week.  Allergies: 1)  ! Codeine 2)  ! Crestor 3)  ! Lipitor  Past History:  Past medical, surgical, family and social histories (including risk factors) reviewed, and no changes noted (except as noted below).  Past Medical History: Reviewed history from 01/29/2009 and no changes required. SYNCOPE/ 1 EPISODE (ICD-780.2) HYPOTENSION, ORTHOSTATIC (ICD-458.0) PALPITATIONS, OCCASIONAL (ICD-785.1) HYPERLIPIDEMIA (ICD-272.4) HYPERTENSION (ICD-401.9) DIABETES MELLITUS, TYPE II (ICD-250.00) GERD (ICD-530.81) ALLERGIC RHINITIS (ICD-477.9) PHARYNGITIS (ICD-462) OTHER ABSCESS OF VULVA (ICD-616.4) SINUSITIS- ACUTE-NOS (ICD-461.9) URI (ICD-465.9) Hx of HEMANGIOMA (ICD-228.00) RECTAL POLYPS (ICD-569.0) CATARACT, HX OF (ICD-V12.40) OSTEOPENIA (ICD-733.90) OSTEOARTHRITIS (ICD-715.90) GOUT (ICD-274.9)    Past Surgical History: Reviewed history from 07/27/2007 and no changes required. Hysterectomy Cataract extraction Heel dexa- normal (2003) Colonoscopy- (2001), rectal polyps (04/2004) Hospital- cavernous hemangioma (04/2004) 2D Echo EF 55-65% (04/2004) Dexa- osteopenia T-1.2 (06/2004) Cavernous angiooma Cervical deg. changes (02/2006) Hospital- pre syncope- orthostatic hypertension (04/2006) Aortic dopplers- mild plaque.  EEG o.k.   D Echo- normal.  EF 55-60% MRI of head- stable R TKR 5/08 colonoscopy  3/09 polyp and diverticulosis  Family History: Reviewed history from 01/29/2009 and no changes required. M aunt breast ca Mother deceased at age 52, unknown cause.  Father deceased at age 13 secondary to CVA.  Four siblings deceased from CHF and old age.  Social  History: Reviewed history from 01/29/2009 and no changes required. 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  Denies ever using tobacco, EtOH, drugs or herbal  medication.  No diet restrictions.  Activity limited secondary to knee discomfort/arthritis.  Review of Systems General:  Denies fatigue and fever. CV:  Denies chest pain or discomfort. Resp:  Denies shortness of breath. GI:  Denies abdominal pain, bloody stools, and constipation. GU:  Denies dysuria.  Physical Exam  General:  Well-developed,well-nourished,in no acute distress; alert,appropriate and cooperative throughout examination Mouth:  MMM Neck:  no carotid bruit or thyromegaly no cervical or supraclavicular lymphadenopathy  Lungs:  Normal respiratory effort, chest expands symmetrically. Lungs are clear to auscultation, no crackles or wheezes. Heart:  Normal rate and regular rhythm. S1 and S2 normal without gallop, murmur, click, rub or other extra sounds. Abdomen:  Bowel sounds positive,abdomen soft and non-tender without masses, organomegaly or hernias noted. Pulses:  R and L posterior tibial pulses are full and equal bilaterally  Extremities:  no edema  Diabetes Management Exam:    Foot Exam (with socks and/or shoes not present):       Sensory-Pinprick/Light touch:          Left medial foot (L-4): normal          Left dorsal foot (L-5): normal          Left lateral foot (S-1): normal          Right medial foot (L-4): normal          Right dorsal foot (L-5): normal          Right lateral foot (S-1): normal       Sensory-Monofilament:          Left foot: normal          Right foot: normal       Inspection:          Left foot: normal          Right foot: normal       Nails:          Left foot: normal          Right foot: normal   Impression & Recommendations:  Problem # 1:  HYPERLIPIDEMIA (ICD-272.4)  Well controlled on current meds.  Her updated  medication list for this problem includes:    Zocor 40 Mg Tabs (Simvastatin) .Marland Kitchen... 1 by mouth once daily  Labs Reviewed: SGOT: 22 (04/27/2009)   SGPT: 18 (04/27/2009)  Lipid Goals: Chol Goal: 200 (10/08/2008)   HDL Goal: 40 (10/08/2008)   LDL Goal: 100 (10/08/2008)   TG Goal: 150 (10/08/2008)  10 Yr Risk Heart Disease: 13 % Prior 10 Yr Risk Heart Disease: 8 % (10/08/2008)   HDL:53.20 (04/27/2009), 51.90 (10/01/2008)  LDL:73 (04/27/2009), 68 (10/01/2008)  Chol:144 (04/27/2009), 137 (10/01/2008)  Trig:90.0 (04/27/2009), 84.0 (10/01/2008)  Problem # 2:  HYPERTENSION (ICD-401.9)  Well controlled on current meds.  Her updated medication list for this problem includes:    Altace 2.5 Mg Caps (Ramipril) .Marland Kitchen... Take one by mouth daily  BP today: 124/72 Prior BP: 120/80 (03/11/2009)  10 Yr Risk Heart Disease: 13 % Prior  10 Yr Risk Heart Disease: 8 % (10/08/2008)  Labs Reviewed: K+: 4.8 (04/27/2009) Creat: : 1.0 (04/27/2009)   Chol: 144 (04/27/2009)   HDL: 53.20 (04/27/2009)   LDL: 73 (04/27/2009)   TG: 90.0 (04/27/2009)  Problem # 3:  DIABETES MELLITUS, TYPE II (ICD-250.00)  Discussed low carb diet...avoid bannana and OJ.[name] has been eating this a lot.  Her updated medication list for this problem includes:    Altace 2.5 Mg Caps (Ramipril) .Marland Kitchen... Take one by mouth daily    Glyburide 2.5 Mg Tabs (Glyburide) .Marland Kitchen... Take one by mouth twice daily as directed- as needed if glucose is over 200    Bayer Aspirin 325 Mg Tabs (Aspirin) .Marland Kitchen... 1/2 tab every day  Labs Reviewed: Creat: 1.0 (04/27/2009)     Last Eye Exam: no retinopathy (04/24/2008) Reviewed HgBA1c results: 6.4 (04/27/2009)  6.4 (01/08/2009)  Complete Medication List: 1)  Zocor 40 Mg Tabs (Simvastatin) .Marland Kitchen.. 1 by mouth once daily 2)  Altace 2.5 Mg Caps (Ramipril) .... Take one by mouth daily 3)  Glyburide 2.5 Mg Tabs (Glyburide) .... Take one by mouth twice daily as directed- as needed if glucose is over 200 4)  Bayer  Aspirin 325 Mg Tabs (Aspirin) .... 1/2 tab every day 5)  Omeprazole 20 Mg Cpdr (Omeprazole) .... 2 tabs by mouth dialy 6)  Milk of Magnesia  .... Take 1 tablet by mouth once a day, 3-4 times per week.  Hypertension Assessment/Plan:      The patient's hypertensive risk group is category C: Target organ damage and/or diabetes.  Her calculated 10 year risk of coronary heart disease is 13 %.  Today's blood pressure is 124/72.  Her blood pressure goal is < 130/80.  Patient Instructions: 1)  Please schedule a follow-up appointment in 3 months .  2)  HgBA1c prior to visit  ICD-9: 250.00  Current Allergies (reviewed today): ! CODEINE ! CRESTOR ! LIPITOR

## 2010-05-24 NOTE — Letter (Signed)
Summary: [name]  [facility]   Imported By: Lanelle Bal 06/30/2009 12:05:57  _____________________________________________________________________  External Attachment:    Type:   Image     Comment:   External Document  Appended Document: [name]     Clinical Lists Changes  Observations: Added new observation of DMEYEEXAMNXT: 06/2010 (07/03/2009 23:20) Added new observation of DMEYEEXMRES: normal (06/21/2009 23:21) Added new observation of EYE EXAM BY: Dr [name] (06/21/2009 23:21) Added new observation of DIAB EYE EX: normal (06/21/2009 23:21)          Diabetes Management Exam:    Eye Exam:       Eye Exam done elsewhere          Date: 06/21/2009          Results: normal          Done by: Dr [name]  Appended Document: Orders Update     Clinical Lists Changes          Diabetes Management History:      The patient is an 77 years old female who comes in for evaluation of Type 2 Diabetes Mellitus.    Diabetes Management Assessment/Plan:      The following lipid goals have been established for the patient: Total cholesterol goal of 200; LDL cholesterol goal of 100; HDL cholesterol goal of 40; Triglyceride goal of 150.  Her blood pressure goal is < 130/80.

## 2010-05-24 NOTE — Assessment & Plan Note (Signed)
Summary: CPX/DLO   Vital Signs:  Patient profile:   77 year old female Height:      66 inches Weight:      147.6 pounds BMI:     23.91 Temp:     98.1 degrees F oral Pulse rate:   76 / minute Pulse rhythm:   regular BP sitting:   122 / 80  (left arm) Cuff size:   regular  Vitals Entered By: Benny Lennert CMA Duncan Dull) (October 29, 2009 2:35 PM)  History of Present Illness: Chief complaint Chronic Health Management  HTN: On altace... well controlled.   High chol: On zocor, well controlled LDL at goal <100.  On fish oil   DM: On glyburide as needed, but rarely taking. Well controlled.  Checking every few days..CBs at goal.  GERD, well controlled on omeprazole. ..much better control  Saw Dr. Sethi/neurology..note reviewed in detail. Neg labs and EEG...he felt partial seizures vs vetebrobasilar  TIAs most likely cause.  Past syncopal events/blanking out spells.  No further episodes. Neg cards eval/EKG.  neg neuro eval. Recent fall..tripped, lost balance. Still with intermittant spells of "feeling like she cannot get her breath/palpitations"..seems to occur when leaning over. CBC and Tyroid NML.   Left lateral ankle pain... worse with walking/standing. No redness no swelling. Hx of arthritis in knees. Has not tried taking any medicaiton.  Problems Prior to Update: 1)  Memory Loss  (ICD-780.93) 2)  Syncope/ 1 Episode  (ICD-780.2) 3)  Hypotension, Orthostatic  (ICD-458.0) 4)  Palpitations, Occasional  (ICD-785.1) 5)  Hyperlipidemia  (ICD-272.4) 6)  Hypertension  (ICD-401.9) 7)  Diabetes Mellitus, Type II  (ICD-250.00) 8)  Gerd  (ICD-530.81) 9)  Allergic Rhinitis  (ICD-477.9) 10)  Pharyngitis  (ICD-462) 11)  Other Abscess of Vulva  (ICD-616.4) 12)  Sinusitis- Acute-nos  (ICD-461.9) 13)  Uri  (ICD-465.9) 14)  Hx of Hemangioma  (ICD-228.00) 15)  Rectal Polyps  (ICD-569.0) 16)  Cataract, Hx of  (ICD-V12.40) 17)  Osteopenia  (ICD-733.90) 18)  Osteoarthritis   (ICD-715.90) 19)  Gout  (ICD-274.9)  Current Medications (verified): 1)  Zocor 40 Mg  Tabs (Simvastatin) .Marland Kitchen.. 1 By Mouth Once Daily 2)  Altace 2.5 Mg Caps (Ramipril) .... Take One By Mouth Daily 3)  Glyburide 2.5 Mg Tabs (Glyburide) .... Take One By Mouth Twice Daily As Directed- As Needed If Glucose Is Over 200 4)  Bayer Aspirin 325 Mg  Tabs (Aspirin) .... 1/2 Tab Every Day 5)  Omeprazole 20 Mg Cpdr (Omeprazole) .... 2 Tabs By Mouth Dialy 6)  Milk of Magnesia .... Take 1 Tablet By Mouth Once A Day, 3-4 Times Per Week.  Allergies: 1)  ! Codeine 2)  ! Crestor 3)  ! Lipitor  Past History:  Past medical, surgical, family and social histories (including risk factors) reviewed, and no changes noted (except as noted below).  Past Medical History: Reviewed history from 01/29/2009 and no changes required. SYNCOPE/ 1 EPISODE (ICD-780.2) HYPOTENSION, ORTHOSTATIC (ICD-458.0) PALPITATIONS, OCCASIONAL (ICD-785.1) HYPERLIPIDEMIA (ICD-272.4) HYPERTENSION (ICD-401.9) DIABETES MELLITUS, TYPE II (ICD-250.00) GERD (ICD-530.81) ALLERGIC RHINITIS (ICD-477.9) PHARYNGITIS (ICD-462) OTHER ABSCESS OF VULVA (ICD-616.4) SINUSITIS- ACUTE-NOS (ICD-461.9) URI (ICD-465.9) Hx of HEMANGIOMA (ICD-228.00) RECTAL POLYPS (ICD-569.0) CATARACT, HX OF (ICD-V12.40) OSTEOPENIA (ICD-733.90) OSTEOARTHRITIS (ICD-715.90) GOUT (ICD-274.9)    Past Surgical History: Reviewed history from 07/27/2007 and no changes required. Hysterectomy Cataract extraction Heel dexa- normal (2003) Colonoscopy- (2001), rectal polyps (04/2004) Hospital- cavernous hemangioma (04/2004) 2D Echo EF 55-65% (04/2004) Dexa- osteopenia T-1.2 (06/2004) Cavernous angiooma Cervical deg. changes (02/2006) Hospital-  pre syncope- orthostatic hypertension (04/2006) Aortic dopplers- mild plaque.  EEG o.k.   D Echo- normal.  EF 55-60% MRI of head- stable R TKR 5/08 colonoscopy 3/09 polyp and diverticulosis  Family History: Reviewed history  from 01/29/2009 and no changes required. M aunt breast ca Mother deceased at age 38, unknown cause.  Father deceased at age 59 secondary to CVA.  Four siblings deceased from CHF and old age.  Social History: Reviewed history from 01/29/2009 and no changes required. 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  Denies ever using tobacco, EtOH, drugs or herbal  medication.  No diet restrictions.  Activity limited secondary to knee discomfort/arthritis.  Review of Systems General:  Denies fatigue and fever. CV:  Denies chest pain or discomfort and swelling of feet. Resp:  Denies cough, sputum productive, and wheezing. GI:  Denies abdominal pain, bloody stools, constipation, and diarrhea. GU:  Denies abnormal vaginal bleeding, discharge, and dysuria. Derm:  Denies rash. Psych:  Denies anxiety, depression, and suicidal thoughts/plans.  Physical Exam  General:  Well-developed,well-nourished,in no acute distress; alert,appropriate and cooperative throughout examination Eyes:  No corneal or conjunctival inflammation noted. EOMI. Perrla. Funduscopic exam benign, without hemorrhages, exudates or papilledema. Vision grossly normal. Ears:  External ear exam shows no significant lesions or deformities.  Otoscopic examination reveals clear canals, tympanic membranes are intact bilaterally without bulging, retraction, inflammation or discharge. Hearing is grossly normal bilaterally. Nose:  External nasal examination shows no deformity or inflammation. Nasal mucosa are pink and moist without lesions or exudates. Mouth:  Oral mucosa and oropharynx without lesions or exudates.  Teeth in good repair. Neck:  no carotid bruit or thyromegaly no cervical or supraclavicular lymphadenopathy  Chest Wall:  No deformities, masses, or tenderness noted. Breasts:  No mass, nodules, thickening, tenderness, bulging, retraction, inflamation, nipple discharge or skin  changes noted.   Lungs:  Normal respiratory effort, chest expands symmetrically. Lungs are clear to auscultation, no crackles or wheezes. Heart:  Normal rate and regular rhythm. S1 and S2 normal without gallop, murmur, click, rub or other extra sounds. Abdomen:  Bowel sounds positive,abdomen soft and non-tender without masses, organomegaly or hernias noted. Genitalia:  no tindicated Msk:  ankle full ROM, no focal ttp, no erythema, no swelling...points to pain at lateral ankle ligamnets Pulses:  R and L posterior tibial pulses are full and equal bilaterally  Extremities:  no edema Skin:  Intact without suspicious lesions or rashes Psych:  Cognition and judgment appear intact. Alert and cooperative with normal attention span and concentration. No apparent delusions, illusions, hallucinations   Impression & Recommendations:  Problem # 1:  DIABETES MELLITUS, TYPE II (ICD-250.00) Well controlled. Continue current medication. Encouraged exercise, weight loss, healthy eating habits.  Her updated medication list for this problem includes:    Altace 2.5 Mg Caps (Ramipril) .Marland Kitchen... Take one by mouth daily    Glyburide 2.5 Mg Tabs (Glyburide) .Marland Kitchen... Take one by mouth twice daily as directed- as needed if glucose is over 200    Bayer Aspirin 325 Mg Tabs (Aspirin) .Marland Kitchen... 1/2 tab every day  Problem # 2:  HYPERLIPIDEMIA (ICD-272.4) Well controlled. Continue current medication.  Her updated medication list for this problem includes:    Zocor 40 Mg Tabs (Simvastatin) .Marland Kitchen... 1 by mouth once daily  Problem # 3:  HYPERTENSION (ICD-401.9) Well controlled. Continue current medication.  Her updated medication list for this problem includes:    Altace 2.5 Mg Caps (Ramipril) .Marland KitchenMarland KitchenMarland KitchenMarland Kitchen  Take one by mouth daily  Problem # 4:  OSTEOPENIA (ICD-733.90)  Due for reeval..on no medicaiotn. Weight bearing exercise and calcium/vit D supplement.   Orders: Radiology Referral (Radiology)  Problem # 5:  Preventive Health Care  (ICD-V70.0) Assessment: Comment Only The patient's preventative maintenance and recommended screening tests for an annual wellness exam were reviewed in full today. Brought up to date unless services declined.  Counselled on the importance of diet, exercise, and its role in overall health and mortality. The patient's FH and SH was reviewed, including their home life, tobacco status, and drug and alcohol status.    She wishes to continue aggressive breast cancer eval given healthy otherwise.   Complete Medication List: 1)  Zocor 40 Mg Tabs (Simvastatin) .Marland Kitchen.. 1 by mouth once daily 2)  Altace 2.5 Mg Caps (Ramipril) .... Take one by mouth daily 3)  Glyburide 2.5 Mg Tabs (Glyburide) .... Take one by mouth twice daily as directed- as needed if glucose is over 200 4)  Bayer Aspirin 325 Mg Tabs (Aspirin) .... 1/2 tab every day 5)  Omeprazole 20 Mg Cpdr (Omeprazole) .... 2 tabs by mouth dialy 6)  Milk of Magnesia  .... Take 1 tablet by mouth once a day, 3-4 times per week. 7)  Meloxicam 7.5 Mg Tabs (Meloxicam) .Marland Kitchen.. 1-2 tab by mouth daily as needed ankle pain  Other Orders: TD Toxoids IM 7 YR + (16109) Admin 1st Vaccine (60454) Admin 1st Vaccine [facility]) [id number])  Patient Instructions: 1)  Referral Appointment Information 2)  Day/Date: 3)  Time: 4)  Place/MD: 5)  Address: 6)  Phone/Fax: 7)  Patient given appointment information. Information/Orders faxed/mailed.  8)  Please schedule a follow-up appointment in 6 months  30 min OV. 9)  HgBA1c prior to visit  ICD-9: 250.00 Prescriptions: MELOXICAM 7.5 MG TABS (MELOXICAM) 1-2 tab by mouth daily as needed ankle pain  #20 x 0   Entered and Authorized by:   Kerby Nora MD   Signed by:   Kerby Nora MD on 10/29/2009   Method used:   Electronically to        [facility]* (retail)       [name] [place], [place]  [address]       Ph: [phone] or [phone]       Fax:  [phone]   RxID:   [id number]   Current Allergies (reviewed today): ! CODEINE ! CRESTOR ! LIPITOR  Flex Sig Next Due:  Not Indicated Last Hemoccult Result: Negative (08/29/2005 1:03:45 PM) Hemoccult Next Due:  Not Indicated Last PAP:  Normal (06/28/2004 1:03:15 PM) PAP Next Due:  Not Indicated      Tetanus/Td Vaccine    Vaccine Type: Td    Site: right deltoid    Mfr: Sanofi Pasteur    Dose: 0.5 ml    Route: IM    Given by: Benny Lennert CMA (AAMA)    Exp. Date: 05/20/2011    Lot #: [id number]    VIS given: 03/12/07 version given October 29, 2009.

## 2010-05-24 NOTE — Assessment & Plan Note (Signed)
Summary: follow up w/ gout/per dr. Ermalene Searing   Vital Signs:  Patient profile:   77 year old female Height:      66 inches Weight:      142.6 pounds BMI:     23.10 Temp:     97.9 degrees F oral Pulse rate:   76 / minute Pulse rhythm:   regular BP sitting:   112 / 70  (left arm) Cuff size:   regular  Vitals Entered By: Benny Lennert CMA Duncan Dull) (December 13, 2009 9:45 AM)  History of Present Illness: Chief complaint gout (patient says she is much better then on friday when she made appt)  77 year old female:  Woke up on Wed. morning and hand was swollen.  Had some gout about four years ago.    pleasant lady who began to have some right-sided hand pain on Friday. Initially she was given some colchicine, no status every 2 hours, she did this for about 2 days until she developed some diarrhea. Her symptoms went away, and she also has some diclofenac, but she has not tried this.  ros: otherwise normal, no fever, chills, sweats, or neuropathic symptoms.  GEN: Well-developed,well-nourished,in no acute distress; alert,appropriate and cooperative throughout examination HEENT: Normocephalic and atraumatic without obvious abnormalities. No apparent alopecia or balding. Ears, externally no deformities PULM: Breathing comfortably in no respiratory distress EXT: No clubbing, cyanosis, or edema PSYCH: Normally interactive. Cooperative during the interview. Pleasant. Friendly and conversant. Not anxious or depressed appearing. Normal, full affect.   Hands: Full range of motion, extensive osteoarthritic changes at all joints of the hand, diffuse Heberden's and Bouchard's nodes, grossly full range of motion and normal strength without any active synovitis currently.  Allergies: 1)  ! Codeine 2)  ! Crestor 3)  ! Lipitor   Impression & Recommendations:  Problem # 1:  GOUT, UNSPECIFIED (ICD-274.9) she is improved, reviewed basic gout diet, given some handouts.  Her updated medication list for  this problem includes:    Colcrys 0.6 Mg Tabs (Colchicine) .Marland Kitchen... 2 tab by mouth x1 , then 1 tab by mouth every 2 hours until pain gone, diarrhea or reach max of 10 in 24 hours    Diclofenac Sodium 75 Mg Tbec (Diclofenac sodium) .Marland Kitchen... 1 tab by mouth two times a day  Complete Medication List: 1)  Zocor 40 Mg Tabs (Simvastatin) .Marland Kitchen.. 1 by mouth once daily 2)  Altace 2.5 Mg Caps (Ramipril) .... Take one by mouth daily 3)  Glyburide 2.5 Mg Tabs (Glyburide) .... Take one by mouth twice daily as directed- as needed if glucose is over 200 4)  Bayer Aspirin 325 Mg Tabs (Aspirin) .... 1/2 tab every day 5)  Omeprazole 20 Mg Cpdr (Omeprazole) .... 2 tabs by mouth dialy 6)  Milk of Magnesia  .... Take 1 tablet by mouth once a day, 3-4 times per week. 7)  Alendronate Sodium 70 Mg Tabs (Alendronate sodium) .Marland Kitchen.. 1 tab by mouth weekly 8)  Keppra 250 Mg Tabs (Levetiracetam) .Marland Kitchen.. 1 tab by mouth two times a day x 6 months 9)  Colcrys 0.6 Mg Tabs (Colchicine) .... 2 tab by mouth x1 , then 1 tab by mouth every 2 hours until pain gone, diarrhea or reach max of 10 in 24 hours 10)  Diclofenac Sodium 75 Mg Tbec (Diclofenac sodium) .Marland Kitchen.. 1 tab by mouth two times a day  Current Allergies (reviewed today): ! CODEINE ! CRESTOR ! LIPITOR

## 2010-05-24 NOTE — Assessment & Plan Note (Signed)
Summary: THROAT, NECK IS HURTING   Vital Signs:  Patient profile:   77 year old female Height:      66 inches Weight:      143 pounds BMI:     23.16 Temp:     98.3 degrees F oral Pulse rate:   68 / minute Pulse rhythm:   regular BP sitting:   130 / 80  (left arm) Cuff size:   regular  Vitals Entered By: Linde Gillis CMA Duncan Dull) (February 09, 2010 9:19 AM) CC: neck pain, sore throat   History of Present Illness: 77 yo with 3 days of throat and neck pain.  Woke up and neck was very stiff, barely able to move it.    Feeling much better now, now has full range of motion and it's a little sore. Wanted to get checked out because her throat is also scratchy. Leaving for [place] in a few days.  No cough, fevers, photophobia, shortness of breath or other symptoms.    Otherwise feels fine.  Current Medications (verified): 1)  Zocor 40 Mg  Tabs (Simvastatin) .Marland Kitchen.. 1 By Mouth Once Daily 2)  Altace 2.5 Mg Caps (Ramipril) .... Take One By Mouth Daily 3)  Glyburide 2.5 Mg Tabs (Glyburide) .... Take One By Mouth Twice Daily As Directed- As Needed If Glucose Is Over 200 4)  Bayer Aspirin 325 Mg  Tabs (Aspirin) .... 1/2 Tab Every Day 5)  Omeprazole 20 Mg Cpdr (Omeprazole) .... 2 Tabs By Mouth Dialy 6)  Milk of Magnesia .... Take 1 Tablet By Mouth Once A Day, 3-4 Times Per Week. 7)  Alendronate Sodium 70 Mg Tabs (Alendronate Sodium) .Marland Kitchen.. 1 Tab By Mouth Weekly 8)  Keppra 250 Mg Tabs (Levetiracetam) .Marland Kitchen.. 1 Tab By Mouth Two Times A Day X 6 Months 9)  Colcrys 0.6 Mg Tabs (Colchicine) .... 2 Tab By Mouth X1 , Then 1 Tab By Mouth Every 2 Hours Until Pain Gone, Diarrhea or Reach Max of 10 in 24 Hours 10)  Diclofenac Sodium 75 Mg Tbec (Diclofenac Sodium) .Marland Kitchen.. 1 Tab By Mouth Two Times A Day  Allergies: 1)  ! Codeine 2)  ! Crestor 3)  ! Lipitor  Past History:  Past Medical History: Last updated: 01/29/2009 SYNCOPE/ 1 EPISODE (ICD-780.2) HYPOTENSION, ORTHOSTATIC (ICD-458.0) PALPITATIONS,  OCCASIONAL (ICD-785.1) HYPERLIPIDEMIA (ICD-272.4) HYPERTENSION (ICD-401.9) DIABETES MELLITUS, TYPE II (ICD-250.00) GERD (ICD-530.81) ALLERGIC RHINITIS (ICD-477.9) PHARYNGITIS (ICD-462) OTHER ABSCESS OF VULVA (ICD-616.4) SINUSITIS- ACUTE-NOS (ICD-461.9) URI (ICD-465.9) Hx of HEMANGIOMA (ICD-228.00) RECTAL POLYPS (ICD-569.0) CATARACT, HX OF (ICD-V12.40) OSTEOPENIA (ICD-733.90) OSTEOARTHRITIS (ICD-715.90) GOUT (ICD-274.9)    Past Surgical History: Last updated: 07/27/2007 Hysterectomy Cataract extraction Heel dexa- normal (2003) Colonoscopy- (2001), rectal polyps (04/2004) Hospital- cavernous hemangioma (04/2004) 2D Echo EF 55-65% (04/2004) Dexa- osteopenia T-1.2 (06/2004) Cavernous angiooma Cervical deg. changes (02/2006) Hospital- pre syncope- orthostatic hypertension (04/2006) Aortic dopplers- mild plaque.  EEG o.k.   D Echo- normal.  EF 55-60% MRI of head- stable R TKR 5/08 colonoscopy 3/09 polyp and diverticulosis  Family History: Last updated: 01/29/2009 M aunt breast ca Mother deceased at age 110, unknown cause.  Father deceased at age 59 secondary to CVA.  Four siblings deceased from CHF and old age.  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  Denies ever using tobacco, EtOH, drugs or herbal  medication.  No diet restrictions.  Activity limited secondary to knee discomfort/arthritis.  Review of Systems  See HPI General:  Denies fever and malaise. Eyes:  Denies blurring and light sensitivity. MS:  Complains of muscle aches and stiffness; denies joint pain, joint redness, joint swelling, and loss of strength.  Physical Exam  General:  Well-developed,well-nourished,in no acute distress; alert,appropriate and cooperative throughout examination Head:  normocephalic and atraumatic.   Eyes:  No corneal or conjunctival inflammation noted. EOMI. Perrla.  Funduscopic exam benign, without hemorrhages, exudates or papilledema. Vision grossly normal. Ears:  External ear exam shows no significant lesions or deformities.  Otoscopic examination reveals clear canals, tympanic membranes are intact bilaterally without bulging, retraction, inflammation or discharge. Hearing is grossly normal bilaterally. Nose:  External nasal examination shows no deformity or inflammation. Nasal mucosa are pink and moist without lesions or exudates. Mouth:  mild PND Neck:  No deformities, masses, or tenderness noted. FROM without difficulty. Extremities:  no edema Neurologic:  alert & oriented X3 and gait normal.   Psych:  Cognition and judgment appear intact. Alert and cooperative with normal attention span and concentration. No apparent delusions, illusions, hallucinations   Impression & Recommendations:  Problem # 1:  NECK PAIN (ICD-723.1) Assessment New Reassurance provided.  Likely an acute strain that is resolving.  No red flag symptoms or physical exam findings. Continue as needed NSAID and heat to area as needed.  Follow up if symptoms worsen. Her updated medication list for this problem includes:    Bayer Aspirin 325 Mg Tabs (Aspirin) .Marland Kitchen... 1/2 tab every day    Diclofenac Sodium 75 Mg Tbec (Diclofenac sodium) .Marland Kitchen... 1 tab by mouth two times a day  Complete Medication List: 1)  Zocor 40 Mg Tabs (Simvastatin) .Marland Kitchen.. 1 by mouth once daily 2)  Altace 2.5 Mg Caps (Ramipril) .... Take one by mouth daily 3)  Glyburide 2.5 Mg Tabs (Glyburide) .... Take one by mouth twice daily as directed- as needed if glucose is over 200 4)  Bayer Aspirin 325 Mg Tabs (Aspirin) .... 1/2 tab every day 5)  Omeprazole 20 Mg Cpdr (Omeprazole) .... 2 tabs by mouth dialy 6)  Milk of Magnesia  .... Take 1 tablet by mouth once a day, 3-4 times per week. 7)  Alendronate Sodium 70 Mg Tabs (Alendronate sodium) .Marland Kitchen.. 1 tab by mouth weekly 8)  Keppra 250 Mg Tabs (Levetiracetam) .Marland Kitchen.. 1 tab by mouth  two times a day x 6 months 9)  Colcrys 0.6 Mg Tabs (Colchicine) .... 2 tab by mouth x1 , then 1 tab by mouth every 2 hours until pain gone, diarrhea or reach max of 10 in 24 hours 10)  Diclofenac Sodium 75 Mg Tbec (Diclofenac sodium) .Marland Kitchen.. 1 tab by mouth two times a day   Orders Added: 1)  Est. Patient Level III [91478]    Current Allergies (reviewed today): ! CODEINE ! CRESTOR ! LIPITOR

## 2010-05-24 NOTE — Progress Notes (Signed)
Summary: gout/diarrhea  Phone Note Call from Patient Call back at Home Phone [phone]   Caller: Patient Call For: Kerby Nora MD Summary of Call: Patient is still having issues w/ the gout in her right hand.  She also has diarrhea today form the medicine. She took her las pill yesterday at 5:30. She is asking if she needs to try something else for the gout.  [facility]. Initial call taken by: Melody Comas,  December 10, 2009 10:57 AM  Follow-up for Phone Call        She needs to be seen...if not improving with gout med.Marland KitchenMarland KitchenI am questioning whether it is really gout.  Will send in diclofenac, but have her make and appt to be seen. Have her stop meloxicam.   Follow-up by: Kerby Nora MD,  December 10, 2009 1:59 PM  Additional Follow-up for Phone Call Additional follow up Details #1::        Patient notified of rx and appt. scheduled for monday w/ dr. Patsy Lager.  Additional Follow-up by: Melody Comas,  December 10, 2009 2:36 PM    New/Updated Medications: DICLOFENAC SODIUM 75 MG TBEC (DICLOFENAC SODIUM) 1 tab by mouth two times a day Prescriptions: DICLOFENAC SODIUM 75 MG TBEC (DICLOFENAC SODIUM) 1 tab by mouth two times a day  #30 x 0   Entered and Authorized by:   Kerby Nora MD   Signed by:   Kerby Nora MD on 12/10/2009   Method used:   Electronically to        [facility]* (retail)       [address] [place], [place]  [address]       Ph: [phone] or [phone]       Fax: [phone]   RxID:   [id number]

## 2010-05-24 NOTE — Assessment & Plan Note (Signed)
Summary: follow up   Vital Signs:  Patient profile:   77 year old female Height:      66 inches Weight:      147.0 pounds Temp:     97.8 degrees F oral Pulse rate:   76 / minute Pulse rhythm:   regular BP sitting:   106 / 62  (left arm) Cuff size:   regular  Vitals Entered By: Benny Lennert CMA Duncan Dull) (July 30, 2009 3:04 PM)  History of Present Illness: Chief complaint follow up  DM, well controlled. CBGs are trending up some at home in past few months ... a 230 level after eating. but not chekcing in AMs and ? 2 hours after meals or less. Not sure what eating.  Not checking regularly Took glyuride  which made her feels ill..did not recheck.   GERD occuring  one a every 2 days. Continues to have catching breath sensation (neg card eval per Dr. Daleen Squibb) lasting second intermittantly. No chest pain.   Problems Prior to Update: 1)  Memory Loss  (ICD-780.93) 2)  Syncope/ 1 Episode  (ICD-780.2) 3)  Hypotension, Orthostatic  (ICD-458.0) 4)  Palpitations, Occasional  (ICD-785.1) 5)  Hyperlipidemia  (ICD-272.4) 6)  Hypertension  (ICD-401.9) 7)  Diabetes Mellitus, Type II  (ICD-250.00) 8)  Gerd  (ICD-530.81) 9)  Allergic Rhinitis  (ICD-477.9) 10)  Pharyngitis  (ICD-462) 11)  Other Abscess of Vulva  (ICD-616.4) 12)  Sinusitis- Acute-nos  (ICD-461.9) 13)  Uri  (ICD-465.9) 14)  Hx of Hemangioma  (ICD-228.00) 15)  Rectal Polyps  (ICD-569.0) 16)  Cataract, Hx of  (ICD-V12.40) 17)  Osteopenia  (ICD-733.90) 18)  Osteoarthritis  (ICD-715.90) 19)  Gout  (ICD-274.9)  Current Medications (verified): 1)  Zocor 40 Mg  Tabs (Simvastatin) .Marland Kitchen.. 1 By Mouth Once Daily 2)  Altace 2.5 Mg Caps (Ramipril) .... Take One By Mouth Daily 3)  Glyburide 2.5 Mg Tabs (Glyburide) .... Take One By Mouth Twice Daily As Directed- As Needed If Glucose Is Over 200 4)  Bayer Aspirin 325 Mg  Tabs (Aspirin) .... 1/2 Tab Every Day 5)  Omeprazole 20 Mg Cpdr (Omeprazole) .... 2 Tabs By Mouth Dialy 6)  Milk of  Magnesia .... Take 1 Tablet By Mouth Once A Day, 3-4 Times Per Week.  Allergies: 1)  ! Codeine 2)  ! Crestor 3)  ! Lipitor  Past History:  Past medical, surgical, family and social histories (including risk factors) reviewed, and no changes noted (except as noted below).  Past Medical History: Reviewed history from 01/29/2009 and no changes required. SYNCOPE/ 1 EPISODE (ICD-780.2) HYPOTENSION, ORTHOSTATIC (ICD-458.0) PALPITATIONS, OCCASIONAL (ICD-785.1) HYPERLIPIDEMIA (ICD-272.4) HYPERTENSION (ICD-401.9) DIABETES MELLITUS, TYPE II (ICD-250.00) GERD (ICD-530.81) ALLERGIC RHINITIS (ICD-477.9) PHARYNGITIS (ICD-462) OTHER ABSCESS OF VULVA (ICD-616.4) SINUSITIS- ACUTE-NOS (ICD-461.9) URI (ICD-465.9) Hx of HEMANGIOMA (ICD-228.00) RECTAL POLYPS (ICD-569.0) CATARACT, HX OF (ICD-V12.40) OSTEOPENIA (ICD-733.90) OSTEOARTHRITIS (ICD-715.90) GOUT (ICD-274.9)    Past Surgical History: Reviewed history from 07/27/2007 and no changes required. Hysterectomy Cataract extraction Heel dexa- normal (2003) Colonoscopy- (2001), rectal polyps (04/2004) Hospital- cavernous hemangioma (04/2004) 2D Echo EF 55-65% (04/2004) Dexa- osteopenia T-1.2 (06/2004) Cavernous angiooma Cervical deg. changes (02/2006) Hospital- pre syncope- orthostatic hypertension (04/2006) Aortic dopplers- mild plaque.  EEG o.k.   D Echo- normal.  EF 55-60% MRI of head- stable R TKR 5/08 colonoscopy 3/09 polyp and diverticulosis  Family History: Reviewed history from 01/29/2009 and no changes required. M aunt breast ca Mother deceased at age 3, unknown cause.  Father deceased at age 51 secondary to  CVA.  Four siblings deceased from CHF and old age.  Social History: Reviewed history from 01/29/2009 and no changes required. 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  Denies ever using tobacco, EtOH, drugs or herbal  medication.  No diet restrictions.   Activity limited secondary to knee discomfort/arthritis.  Review of Systems General:  Denies fatigue and fever. CV:  Denies chest pain or discomfort. Resp:  Complains of shortness of breath; denies sputum productive and wheezing. GI:  Denies abdominal pain. GU:  Denies dysuria.  Physical Exam  General:  Well-developed,well-nourished,in no acute distress; alert,appropriate and cooperative throughout examination Mouth:  MMM Neck:  no carotid bruit or thyromegaly no cervical or supraclavicular lymphadenopathy  Lungs:  Normal respiratory effort, chest expands symmetrically. Lungs are clear to auscultation, no crackles or wheezes. Heart:  Normal rate and regular rhythm. S1 and S2 normal without gallop, murmur, click, rub or other extra sounds. Abdomen:  Bowel sounds positive,abdomen soft and non-tender without masses, organomegaly or hernias noted. Pulses:  R and L posterior tibial pulses are full and equal bilaterally  Extremities:  no edema  Diabetes Management Exam:    Foot Exam (with socks and/or shoes not present):       Sensory-Pinprick/Light touch:          Left medial foot (L-4): normal          Left dorsal foot (L-5): normal          Left lateral foot (S-1): normal          Right medial foot (L-4): normal          Right dorsal foot (L-5): normal          Right lateral foot (S-1): normal       Sensory-Monofilament:          Left foot: normal          Right foot: normal       Inspection:          Left foot: normal          Right foot: normal       Nails:          Left foot: normal          Right foot: normal   Impression & Recommendations:  Problem # 1:  HYPERTENSION (ICD-401.9) Well controlled. Continue current medication.  BP today: 106/62 Prior BP: 124/72 (04/30/2009)  Prior 10 Yr Risk Heart Disease: 13 % (04/30/2009)  Labs Reviewed: K+: 4.8 (04/27/2009) Creat: : 1.0 (04/27/2009)   Chol: 144 (04/27/2009)   HDL: 53.20 (04/27/2009)   LDL: 73 (04/27/2009)   TG:  90.0 (04/27/2009)  Problem # 2:  DIABETES MELLITUS, TYPE II (ICD-250.00) In the next week..check blood sugar fasting in morning and 2 hours after lunch or dinner. Call with measurments. Well controlled per A1C. Will watch for possible gradual increase as stated previously. . Continue current medication.  Her updated medication list for this problem includes:    Altace 2.5 Mg Caps (Ramipril) .Marland Kitchen... Take one by mouth daily    Glyburide 2.5 Mg Tabs (Glyburide) .Marland Kitchen... Take one by mouth twice daily as directed- as needed if glucose is over 200    Bayer Aspirin 325 Mg Tabs (Aspirin) .Marland Kitchen... 1/2 tab every day  Labs Reviewed: Creat: 1.0 (04/27/2009)     Last Eye Exam: normal (06/21/2009) Reviewed HgBA1c results: 6.5 (07/27/2009)  6.4 (04/27/2009)  Problem # 3:  GERD (ICD-530.81) ? if cause of catching of breath. Increase prilosec to 20-40 mg daily, regularly.  Her updated medication list for this problem includes:    Omeprazole 20 Mg Cpdr (Omeprazole) .Marland Kitchen... 2 tabs by mouth dialy  Orders: Prescription Created Electronically [phone])   Discussed lifestyle modifications, diet, antacids/medications, and preventive measures. Handout provided.   Complete Medication List: 1)  Zocor 40 Mg Tabs (Simvastatin) .Marland Kitchen.. 1 by mouth once daily 2)  Altace 2.5 Mg Caps (Ramipril) .... Take one by mouth daily 3)  Glyburide 2.5 Mg Tabs (Glyburide) .... Take one by mouth twice daily as directed- as needed if glucose is over 200 4)  Bayer Aspirin 325 Mg Tabs (Aspirin) .... 1/2 tab every day 5)  Omeprazole 20 Mg Cpdr (Omeprazole) .... 2 tabs by mouth dialy 6)  Milk of Magnesia  .... Take 1 tablet by mouth once a day, 3-4 times per week.  Patient Instructions: 1)  In the next week..check blood sugar fasting in morning and 2 hours after lunch or dinner. Call with measurments. 2)  Take prilosec every day..if still funny catching breath sensation..you can increase to 2 tabs daily of prilosec.  3)  Avoid tomatos, citris,  etc.  4)  Please schedule a follow-up appointment in 3 months CPX 5)  BMP prior to visit, ICD-9: 250.00  6)  Hepatic Panel prior to visit ICD-9:  7)  Lipid panel prior to visit ICD-9 :  8)  HgBA1c prior to visit  ICD-9:  9)  Urine Microalbumin prior to visit ICD-9 :  10)  TSH prior to visit ICD-9 :  11)  CBC w/ Diff prior to visit ICD-9 :  Prescriptions: OMEPRAZOLE 20 MG CPDR (OMEPRAZOLE) 2 tabs by mouth dialy  #30 x 11   Entered and Authorized by:   Kerby Nora MD   Signed by:   Kerby Nora MD on 07/30/2009   Method used:   Electronically to        [facility]* (retail)       [address] [place], [place]  [address]       Ph: [phone] or [phone]       Fax: [phone]   RxID:   [id number]   Current Allergies (reviewed today): ! CODEINE ! CRESTOR ! LIPITOR

## 2010-05-24 NOTE — Progress Notes (Signed)
Summary: Per pt blood sugar reading for this week     Phone Note Call from Patient   Caller: [phone] Summary of Call: Pt called w/ Bloodsugar reading before breakfast in the AM and  in the PM readings, Beginning ,  Monday, Apri 11th Monday-AM 134- PM-142 TuesdayAM 109-PM-109pm                        Wednesday-AM- 160 -PM 177  Thursday -AM 139-pm 197 Friday-AM-118- PM 112 Pt says she was told to call w/ these readings.Daine Gip  August 06, 2009 1:50 PM  Initial call taken by: Daine Gip,  August 06, 2009 1:50 PM  Follow-up for Phone Call        Notify pt blood sugars are not at goal, but not wildly high...recommend working on low carbohydrate diet.  Will check at next scheuled OV.  Follow-up by: Kerby Nora MD,  August 06, 2009 5:34 PM  Additional Follow-up for Phone Call Additional follow up Details #1::        No answer or answering machine.      Lowella Petties CMA  August 06, 2009 5:52 PM     Additional Follow-up for Phone Call Additional follow up Details #2::    Patient advised and she will work on the diet Follow-up by: Benny Lennert CMA Duncan Dull),  August 09, 2009 8:38 AM

## 2010-05-24 NOTE — Assessment & Plan Note (Signed)
Summary: discuss bone density test/hmw   Vital Signs:  Patient profile:   77 year old female Height:      66 inches Weight:      145.8 pounds BMI:     23.62 Temp:     98.2 degrees F oral Pulse rate:   76 / minute Pulse rhythm:   regular BP sitting:   116 / 80  (left arm) Cuff size:   regular  Vitals Entered By: Benny Lennert CMA Duncan Dull) (November 12, 2009 2:39 PM)  History of Present Illness: Chief complaint discuss bone density test   From 2006 to now bone density has declined significantly..  T score hip -1.2 to -1.6 femoral neck-2.2 to -2.9   spine -0.3    Problems Prior to Update: 1)  Osteoporosis  (ICD-733.00) 2)  Other Screening Mammogram  (ICD-V76.12) 3)  Memory Loss  (ICD-780.93) 4)  Syncope/ 1 Episode  (ICD-780.2) 5)  Hypotension, Orthostatic  (ICD-458.0) 6)  Palpitations, Occasional  (ICD-785.1) 7)  Hyperlipidemia  (ICD-272.4) 8)  Hypertension  (ICD-401.9) 9)  Diabetes Mellitus, Type II  (ICD-250.00) 10)  Gerd  (ICD-530.81) 11)  Allergic Rhinitis  (ICD-477.9) 12)  Pharyngitis  (ICD-462) 13)  Other Abscess of Vulva  (ICD-616.4) 14)  Sinusitis- Acute-nos  (ICD-461.9) 15)  Uri  (ICD-465.9) 16)  Hx of Hemangioma  (ICD-228.00) 17)  Rectal Polyps  (ICD-569.0) 18)  Cataract, Hx of  (ICD-V12.40) 19)  Osteoarthritis  (ICD-715.90) 20)  Gout  (ICD-274.9)  Current Medications (verified): 1)  Zocor 40 Mg  Tabs (Simvastatin) .Marland Kitchen.. 1 By Mouth Once Daily 2)  Altace 2.5 Mg Caps (Ramipril) .... Take One By Mouth Daily 3)  Glyburide 2.5 Mg Tabs (Glyburide) .... Take One By Mouth Twice Daily As Directed- As Needed If Glucose Is Over 200 4)  Bayer Aspirin 325 Mg  Tabs (Aspirin) .... 1/2 Tab Every Day 5)  Omeprazole 20 Mg Cpdr (Omeprazole) .... 2 Tabs By Mouth Dialy 6)  Milk of Magnesia .... Take 1 Tablet By Mouth Once A Day, 3-4 Times Per Week. 7)  Meloxicam 7.5 Mg Tabs (Meloxicam) .Marland Kitchen.. 1-2 Tab By Mouth Daily As Needed Ankle Pain  Allergies: 1)  ! Codeine 2)  !  Crestor 3)  ! Lipitor  Past History:  Past medical, surgical, family and social histories (including risk factors) reviewed, and no changes noted (except as noted below).  Past Medical History: Reviewed history from 01/29/2009 and no changes required. SYNCOPE/ 1 EPISODE (ICD-780.2) HYPOTENSION, ORTHOSTATIC (ICD-458.0) PALPITATIONS, OCCASIONAL (ICD-785.1) HYPERLIPIDEMIA (ICD-272.4) HYPERTENSION (ICD-401.9) DIABETES MELLITUS, TYPE II (ICD-250.00) GERD (ICD-530.81) ALLERGIC RHINITIS (ICD-477.9) PHARYNGITIS (ICD-462) OTHER ABSCESS OF VULVA (ICD-616.4) SINUSITIS- ACUTE-NOS (ICD-461.9) URI (ICD-465.9) Hx of HEMANGIOMA (ICD-228.00) RECTAL POLYPS (ICD-569.0) CATARACT, HX OF (ICD-V12.40) OSTEOPENIA (ICD-733.90) OSTEOARTHRITIS (ICD-715.90) GOUT (ICD-274.9)    Past Surgical History: Reviewed history from 07/27/2007 and no changes required. Hysterectomy Cataract extraction Heel dexa- normal (2003) Colonoscopy- (2001), rectal polyps (04/2004) Hospital- cavernous hemangioma (04/2004) 2D Echo EF 55-65% (04/2004) Dexa- osteopenia T-1.2 (06/2004) Cavernous angiooma Cervical deg. changes (02/2006) Hospital- pre syncope- orthostatic hypertension (04/2006) Aortic dopplers- mild plaque.  EEG o.k.   D Echo- normal.  EF 55-60% MRI of head- stable R TKR 5/08 colonoscopy 3/09 polyp and diverticulosis  Family History: Reviewed history from 01/29/2009 and no changes required. M aunt breast ca Mother deceased at age 26, unknown cause.  Father deceased at age 75 secondary to CVA.  Four siblings deceased from CHF and old age.  Social History: Reviewed history from 01/29/2009 and no changes  required. 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  Denies ever using tobacco, EtOH, drugs or herbal  medication.  No diet restrictions.  Activity limited secondary to knee discomfort/arthritis.  Physical Exam  General:   Well-developed,well-nourished,in no acute distress; alert,appropriate and cooperative throughout examination Mouth:  Oral mucosa and oropharynx without lesions or exudates.  Teeth in good repair. Neck:  no carotid bruit or thyromegaly no cervical or supraclavicular lymphadenopathy  Lungs:  Normal respiratory effort, chest expands symmetrically. Lungs are clear to auscultation, no crackles or wheezes. Heart:  Normal rate and regular rhythm. S1 and S2 normal without gallop, murmur, click, rub or other extra sounds. Pulses:  R and L posterior tibial pulses are full and equal bilaterally  Extremities:  no edema   Impression & Recommendations:  Problem # 1:  OSTEOPOROSIS (ICD-733.00) [place] on treatment option, SE and benefits of treatment.  Will check vit D.  Safety eval at home.  Start fosamax...increase PPI at same time given moderate control GERd at this time.  Her updated medication list for this problem includes:    Alendronate Sodium 70 Mg Tabs (Alendronate sodium) .Marland Kitchen... 1 tab by mouth weekly  Orders: T-Vitamin D (25-Hydroxy) [phone])  Complete Medication List: 1)  Zocor 40 Mg Tabs (Simvastatin) .Marland Kitchen.. 1 by mouth once daily 2)  Altace 2.5 Mg Caps (Ramipril) .... Take one by mouth daily 3)  Glyburide 2.5 Mg Tabs (Glyburide) .... Take one by mouth twice daily as directed- as needed if glucose is over 200 4)  Bayer Aspirin 325 Mg Tabs (Aspirin) .... 1/2 tab every day 5)  Omeprazole 20 Mg Cpdr (Omeprazole) .... 2 tabs by mouth dialy 6)  Milk of Magnesia  .... Take 1 tablet by mouth once a day, 3-4 times per week. 7)  Meloxicam 7.5 Mg Tabs (Meloxicam) .Marland Kitchen.. 1-2 tab by mouth daily as needed ankle pain 8)  Alendronate Sodium 70 Mg Tabs (Alendronate sodium) .Marland Kitchen.. 1 tab by mouth weekly  Patient Instructions: 1)  Calcium/vit D combo 600mg /400IU two times a day  2)   Plus multivitamin.  3)   Start weight bearing exercsie. 4)   Continue omeprazole daily...2tab by mouth  daily. Prescriptions: ALENDRONATE SODIUM 70 MG TABS (ALENDRONATE SODIUM) 1 tab by mouth weekly  #4 x 11   Entered and Authorized by:   Kerby Nora MD   Signed by:   Kerby Nora MD on 11/12/2009   Method used:   Electronically to        [facility]* (retail)       [address] [place], [place]  [address]       Ph: [phone] or [phone]       Fax: [phone]   RxID:   [id number]   Current Allergies (reviewed today): ! CODEINE ! CRESTOR ! LIPITOR

## 2010-05-24 NOTE — Progress Notes (Signed)
Summary: re zostavax  Phone Note Call from Patient Call back at Home Phone [phone]   Caller: Patient Call For: Judith Part MD Summary of Call: Pt is asking if you think she should get zostavax, states her insurance wil pay for it. Initial call taken by: Lowella Petties,  February 27, 2008 3:50 PM  Follow-up for Phone Call        if she has never had shingles I do recommend it  Follow-up by: Judith Part MD,  February 27, 2008 4:13 PM  Additional Follow-up for Phone Call Additional follow up Details #1::        Advised patient.  ......................................................Marland KitchenLiane Comber February 27, 2008 4:29 PM

## 2010-05-26 NOTE — Assessment & Plan Note (Signed)
Summary: SINUS SYMPTOMS   Vital Signs:  Patient profile:   77 year old female Height:      66 inches Weight:      144.50 pounds BMI:     23.41 Temp:     97.7 degrees F oral Pulse rate:   80 / minute Pulse rhythm:   regular BP sitting:   130 / 72  (left arm)  Vitals Entered By: Melody Comas (April 04, 2010 2:35 PM) CC: sinus infection   History of Present Illness: 77 yo here for ? sinus infection.  One week ago, developped URI symptoms- runny nose, malaise, ear pain. All of that has resolved, now has dry, hacking cough keeping her up at night and irritated throat. No fevers, chills, CP or SOB. No n/v/d.  Not taking anything OTC.   Current Medications (verified): 1)  Zocor 40 Mg  Tabs (Simvastatin) .Marland Kitchen.. 1 By Mouth Once Daily 2)  Altace 2.5 Mg Caps (Ramipril) .... Take One By Mouth Daily 3)  Glyburide 2.5 Mg Tabs (Glyburide) .... Take One By Mouth Twice Daily As Directed- As Needed If Glucose Is Over 200 4)  Bayer Aspirin 325 Mg  Tabs (Aspirin) .... 1/2 Tab Every Day 5)  Omeprazole 20 Mg Cpdr (Omeprazole) .... 2 Tabs By Mouth Dialy 6)  Milk of Magnesia .... Take 1 Tablet By Mouth Once A Day, 3-4 Times Per Week. 7)  Alendronate Sodium 70 Mg Tabs (Alendronate Sodium) .Marland Kitchen.. 1 Tab By Mouth Weekly 8)  Keppra 250 Mg Tabs (Levetiracetam) .Marland Kitchen.. 1 Tab By Mouth Two Times A Day X 6 Months 9)  Cephalexin 500 Mg Caps (Cephalexin) .Marland Kitchen.. 1 Tab By Mouth Three Times A Day X 7 Days 10)  Tessalon Perles 100 Mg  Caps (Benzonatate) .Marland Kitchen.. 1 Tab By Mouth Three Times A Day As Needed Cough.  Allergies: 1)  ! Codeine 2)  ! Crestor 3)  ! Lipitor  Past History:  Past Medical History: Last updated: 01/29/2009 SYNCOPE/ 1 EPISODE (ICD-780.2) HYPOTENSION, ORTHOSTATIC (ICD-458.0) PALPITATIONS, OCCASIONAL (ICD-785.1) HYPERLIPIDEMIA (ICD-272.4) HYPERTENSION (ICD-401.9) DIABETES MELLITUS, TYPE II (ICD-250.00) GERD (ICD-530.81) ALLERGIC RHINITIS (ICD-477.9) PHARYNGITIS (ICD-462) OTHER ABSCESS OF  VULVA (ICD-616.4) SINUSITIS- ACUTE-NOS (ICD-461.9) URI (ICD-465.9) Hx of HEMANGIOMA (ICD-228.00) RECTAL POLYPS (ICD-569.0) CATARACT, HX OF (ICD-V12.40) OSTEOPENIA (ICD-733.90) OSTEOARTHRITIS (ICD-715.90) GOUT (ICD-274.9)    Past Surgical History: Last updated: 07/27/2007 Hysterectomy Cataract extraction Heel dexa- normal (2003) Colonoscopy- (2001), rectal polyps (04/2004) Hospital- cavernous hemangioma (04/2004) 2D Echo EF 55-65% (04/2004) Dexa- osteopenia T-1.2 (06/2004) Cavernous angiooma Cervical deg. changes (02/2006) Hospital- pre syncope- orthostatic hypertension (04/2006) Aortic dopplers- mild plaque.  EEG o.k.   D Echo- normal.  EF 55-60% MRI of head- stable R TKR 5/08 colonoscopy 3/09 polyp and diverticulosis  Family History: Last updated: 01/29/2009 M aunt breast ca Mother deceased at age 54, unknown cause.  Father deceased at age 14 secondary to CVA.  Four siblings deceased from CHF and old age.  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  Denies ever using tobacco, EtOH, drugs or herbal  medication.  No diet restrictions.  Activity limited secondary to knee discomfort/arthritis.  Review of Systems      See HPI General:  Complains of malaise; denies fever. ENT:  Complains of nasal congestion and sore throat. CV:  Denies chest pain or discomfort. Resp:  Complains of cough; denies shortness of breath, sputum productive, and wheezing.  Physical Exam  General:  Well-developed,well-nourished,in no acute  distress; alert,appropriate and cooperative throughout examination VSS, non toxic appearing Ears:  External ear exam shows no significant lesions or deformities.  Otoscopic examination reveals clear canals, tympanic membranes are intact bilaterally without bulging, retraction, inflammation or discharge. Hearing is grossly normal bilaterally. Nose:  mild mucosal  erythema, no edema or obstructions Mouth:  MMM, mild pharyngeal erythema. Neck:  No deformities, masses, or tenderness noted. FROM without difficulty. Lungs:  Normal respiratory effort, chest expands symmetrically. Lungs are clear to auscultation, no crackles or wheezes. Heart:  Normal rate and regular rhythm. S1 and S2 normal without gallop, murmur, click, rub or other extra sounds. Extremities:  no edema Psych:  Cognition and judgment appear intact. Alert and cooperative with normal attention span and concentration. No apparent delusions, illusions, hallucinations   Impression & Recommendations:  Problem # 1:  URI (ICD-465.9) Assessment New  Likely viral with resolving symptoms. Tessalon perles as needed cough. Pt to call office if no improvement by mid week. Her updated medication list for this problem includes:    Bayer Aspirin 325 Mg Tabs (Aspirin) .Marland Kitchen... 1/2 tab every day    Tessalon Perles 100 Mg Caps (Benzonatate) .Marland Kitchen... 1 tab by mouth three times a day as needed cough.  Orders: Prescription Created Electronically [id number])  Complete Medication List: 1)  Zocor 40 Mg Tabs (Simvastatin) .Marland Kitchen.. 1 by mouth once daily 2)  Altace 2.5 Mg Caps (Ramipril) .... Take one by mouth daily 3)  Glyburide 2.5 Mg Tabs (Glyburide) .... Take one by mouth twice daily as directed- as needed if glucose is over 200 4)  Bayer Aspirin 325 Mg Tabs (Aspirin) .... 1/2 tab every day 5)  Omeprazole 20 Mg Cpdr (Omeprazole) .... 2 tabs by mouth dialy 6)  Milk of Magnesia  .... Take 1 tablet by mouth once a day, 3-4 times per week. 7)  Alendronate Sodium 70 Mg Tabs (Alendronate sodium) .Marland Kitchen.. 1 tab by mouth weekly 8)  Keppra 250 Mg Tabs (Levetiracetam) .Marland Kitchen.. 1 tab by mouth two times a day x 6 months 9)  Cephalexin 500 Mg Caps (Cephalexin) .Marland Kitchen.. 1 tab by mouth three times a day x 7 days 10)  Tessalon Perles 100 Mg Caps (Benzonatate) .Marland Kitchen.. 1 tab by mouth three times a day as needed cough.  Patient Instructions: 1)   Get plenty of rest, drink lots of clear liquids, and use Tylenol or Ibuprofen for fever and comfort. Return in 7-10 days if you're not better: sooner if you'er feeling worse.  Prescriptions: TESSALON PERLES 100 MG  CAPS (BENZONATATE) 1 tab by mouth three times a day as needed cough.  #30 x 0   Entered and Authorized by:   Ruthe Mannan MD   Signed by:   Ruthe Mannan MD on 04/04/2010   Method used:   Electronically to        [facility]* (retail)       [address] [place], [place]  [address]       Ph: [phone] or [phone]       Fax: [phone]   RxID:   [id number]    Orders Added: 1)  Prescription Created Electronically [[id number]] 2)  Est. Patient Level III [66440]    Current Allergies (reviewed today): ! CODEINE ! CRESTOR ! LIPITOR

## 2010-05-26 NOTE — Progress Notes (Signed)
Summary: sees black hairs coming down in her eyes  Phone Note Call from Patient   Caller: Patient Call For: [name] Nora MD Summary of Call: Pt called and said she sees black hair coming down in her eye, but she doesnt have black hair.  Advised her to call her eye doctor. Initial call taken by: Lowella Petties CMA, AAMA,  April 20, 2010 2:25 PM  Follow-up for Phone Call        Agreed.. eval ASAP.  Follow-up by: [name] Nora MD,  April 20, 2010 10:18 PM

## 2010-06-01 NOTE — Assessment & Plan Note (Signed)
Summary: ROA 30 MIN IN 6 MTHS CYD   Vital Signs:  Patient profile:   77 year old female Height:      66 inches Weight:      147 pounds BMI:     23.81 Temp:     97.8 degrees F oral Pulse rate:   76 / minute Pulse rhythm:   regular BP sitting:   120 / 72  (left arm) Cuff size:   regular  Vitals Entered By: Benny Lennert CMA Duncan Dull) (May 24, 2010 2:58 PM)  History of Present Illness: Chief complaint 6 month follow up     HIgh cholesterol.. well controlled last check 09/2009. LDL 78.Marland Kitchen goal <70 On simvastain 40 mg daily   DM, A1C 6.7 well controlled  On glyburide low dose two times a day only as needed.  GOut: no further gout flares. Used colchicine as needed.  Past syncopal events/blanking out spells. NO RECENT episodes.  Neg cards eval/EKG.    Neg neuro eval... EEG nml...Dr. Pearlean Brownie. Intermittant spells of "feeling like she cannot get her breath/palpitations"..seems to occur when leaning over. CBC and Tyroid NML 01/2009..MRI.MRA...no acute infact  MRA neck...no carotid or vertebral stenosis .ECHO showed nml EF, no clear embolic source    Diabetes Management History:      The patient is an 76 years old female who comes in for evaluation of Type 2 Diabetes Mellitus.    Hypertension History:      Well controlled on altace.        Positive major cardiovascular risk factors include female age 56 years old or older, diabetes, hyperlipidemia, and hypertension.    Lipid Management History:      Positive NCEP/ATP III risk factors include female age 80 years old or older, diabetes, and hypertension.        Her compliance with the TLC diet is good.      Problems Prior to Update: 1)  Uri  (ICD-465.9) 2)  Sebaceous Cyst, Infected  (ICD-706.2) 3)  Neck Pain  (ICD-723.1) 4)  Gout, Unspecified  (ICD-274.9) 5)  Altered Mental Status  (ICD-780.97) 6)  Preoperative Examination  (ICD-V72.84) 7)  Osteoporosis  (ICD-733.00) 8)  Other Screening Mammogram  (ICD-V76.12) 9)   Memory Loss  (ICD-780.93) 10)  Syncope/ 1 Episode  (ICD-780.2) 11)  Hypotension, Orthostatic  (ICD-458.0) 12)  Palpitations, Occasional  (ICD-785.1) 13)  Hyperlipidemia  (ICD-272.4) 14)  Hypertension  (ICD-401.9) 15)  Diabetes Mellitus, Type II  (ICD-250.00) 16)  Gerd  (ICD-530.81) 17)  Allergic Rhinitis  (ICD-477.9) 18)  Pharyngitis  (ICD-462) 19)  Other Abscess of Vulva  (ICD-616.4) 20)  Sinusitis- Acute-nos  (ICD-461.9) 21)  Uri  (ICD-465.9) 22)  Hx of Hemangioma  (ICD-228.00) 23)  Rectal Polyps  (ICD-569.0) 24)  Cataract, Hx of  (ICD-V12.40) 25)  Osteoarthritis  (ICD-715.90) 26)  Gout  (ICD-274.9)  Current Medications (verified): 1)  Zocor 40 Mg  Tabs (Simvastatin) .Marland Kitchen.. 1 By Mouth Once Daily 2)  Altace 2.5 Mg Caps (Ramipril) .... Take One By Mouth Daily 3)  Glyburide 2.5 Mg Tabs (Glyburide) .... Take One By Mouth Twice Daily As Directed- As Needed If Glucose Is Over 200 4)  Bayer Aspirin 325 Mg  Tabs (Aspirin) .... 1/2 Tab Every Day 5)  Omeprazole 20 Mg Cpdr (Omeprazole) .Marland Kitchen.. 1 Tabs By Mouth Daily As Needed Gerd 6)  Milk of Magnesia .... Take 1 Tablet By Mouth Once A Day, 3-4 Times Per Week. 7)  Alendronate Sodium 70 Mg Tabs (Alendronate Sodium) .Marland KitchenMarland KitchenMarland Kitchen 1  Tab By Mouth Weekly  Allergies: 1)  ! Codeine 2)  ! Crestor 3)  ! Lipitor  Past History:  Past medical, surgical, family and social histories (including risk factors) reviewed, and no changes noted (except as noted below).  Past Medical History: Reviewed history from 01/29/2009 and no changes required. SYNCOPE/ 1 EPISODE (ICD-780.2) HYPOTENSION, ORTHOSTATIC (ICD-458.0) PALPITATIONS, OCCASIONAL (ICD-785.1) HYPERLIPIDEMIA (ICD-272.4) HYPERTENSION (ICD-401.9) DIABETES MELLITUS, TYPE II (ICD-250.00) GERD (ICD-530.81) ALLERGIC RHINITIS (ICD-477.9) PHARYNGITIS (ICD-462) OTHER ABSCESS OF VULVA (ICD-616.4) SINUSITIS- ACUTE-NOS (ICD-461.9) URI (ICD-465.9) Hx of HEMANGIOMA (ICD-228.00) RECTAL POLYPS (ICD-569.0) CATARACT,  HX OF (ICD-V12.40) OSTEOPENIA (ICD-733.90) OSTEOARTHRITIS (ICD-715.90) GOUT (ICD-274.9)    Past Surgical History: Reviewed history from 07/27/2007 and no changes required. Hysterectomy Cataract extraction Heel dexa- normal (2003) Colonoscopy- (2001), rectal polyps (04/2004) Hospital- cavernous hemangioma (04/2004) 2D Echo EF 55-65% (04/2004) Dexa- osteopenia T-1.2 (06/2004) Cavernous angiooma Cervical deg. changes (02/2006) Hospital- pre syncope- orthostatic hypertension (04/2006) Aortic dopplers- mild plaque.  EEG o.k.   D Echo- normal.  EF 55-60% MRI of head- stable R TKR 5/08 colonoscopy 3/09 polyp and diverticulosis  Family History: Reviewed history from 01/29/2009 and no changes required. M aunt breast ca Mother deceased at age 31, unknown cause.  Father deceased at age 56 secondary to CVA.  Four siblings deceased from CHF and old age.  Social History: Reviewed history from 01/29/2009 and no changes required. 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  Denies ever using tobacco, EtOH, drugs or herbal  medication.  No diet restrictions.  Activity limited secondary to knee discomfort/arthritis.  Review of Systems General:  Denies fatigue and fever. CV:  Denies chest pain or discomfort. Resp:  Denies shortness of breath and sputum productive. GI:  Denies abdominal pain and bloody stools. GU:  Denies dysuria.  Physical Exam  General:  Well-developed,well-nourished,in no acute distress; alert,appropriate and cooperative throughout examination VSS, non toxic appearing Eyes:  No corneal or conjunctival inflammation noted. EOMI. Perrla. Funduscopic exam benign, without hemorrhages, exudates or papilledema. Vision grossly normal. Mouth:  MMM Neck:  no carotid bruit or thyromegaly no cervical or supraclavicular lymphadenopathy  Lungs:  Normal respiratory effort, chest expands symmetrically. Lungs are clear to  auscultation, no crackles or wheezes. Heart:  Normal rate and regular rhythm. S1 and S2 normal without gallop, murmur, click, rub or other extra sounds. Abdomen:  Bowel sounds positive,abdomen soft and non-tender without masses, organomegaly or hernias noted. Neurologic:  No cranial nerve deficits noted. Station and gait are normal. Plantar reflexes are down-going bilaterally. DTRs are symmetrical throughout. Sensory, motor and coordinative functions appear intact. Skin:  well healed sebaceous cyst site left lower abdomen Psych:  Cognition and judgment appear intact. Alert and cooperative with normal attention span and concentration. No apparent delusions, illusions, hallucinations  Diabetes Management Exam:    Foot Exam (with socks and/or shoes not present):       Sensory-Pinprick/Light touch:          Left medial foot (L-4): normal          Left dorsal foot (L-5): normal          Left lateral foot (S-1): normal          Right medial foot (L-4): normal          Right dorsal foot (L-5): normal          Right lateral foot (S-1): normal       Sensory-Monofilament:  Left foot: normal          Right foot: normal       Inspection:          Left foot: normal          Right foot: normal       Nails:          Left foot: normal          Right foot: normal   Impression & Recommendations:  Problem # 1:  HYPERTENSION (ICD-401.9) Well controlled. Continue current medication.  Her updated medication list for this problem includes:    Altace 2.5 Mg Caps (Ramipril) .Marland Kitchen... Take one by mouth daily  Problem # 2:  DIABETES MELLITUS, TYPE II (ICD-250.00) Well controlled on no medicaiton. Not requiring glyburide.  Her updated medication list for this problem includes:    Altace 2.5 Mg Caps (Ramipril) .Marland Kitchen... Take one by mouth daily    Glyburide 2.5 Mg Tabs (Glyburide) .Marland Kitchen... Take one by mouth twice daily as directed- as needed if glucose is over 200    Bayer Aspirin 325 Mg Tabs (Aspirin) .Marland Kitchen...  1/2 tab every day  Problem # 3:  HYPERLIPIDEMIA (ICD-272.4) Well controlled. Continue current medication. Recehck in 6 months. Encouraged exercise, weight loss, healthy eating habits.  Her updated medication list for this problem includes:    Zocor 40 Mg Tabs (Simvastatin) .Marland Kitchen... 1 by mouth once daily  Problem # 4:  GOUT, UNSPECIFIED (ICD-274.9) No further flares.. check uric acid next lab visit.   Problem # 5:  SYNCOPE/ 1 EPISODE (ICD-780.2) No further episodes.  Complete Medication List: 1)  Zocor 40 Mg Tabs (Simvastatin) .Marland Kitchen.. 1 by mouth once daily 2)  Altace 2.5 Mg Caps (Ramipril) .... Take one by mouth daily 3)  Glyburide 2.5 Mg Tabs (Glyburide) .... Take one by mouth twice daily as directed- as needed if glucose is over 200 4)  Bayer Aspirin 325 Mg Tabs (Aspirin) .... 1/2 tab every day 5)  Omeprazole 20 Mg Cpdr (Omeprazole) .Marland Kitchen.. 1 tabs by mouth daily as needed gerd 6)  Milk of Magnesia  .... Take 1 tablet by mouth once a day, 3-4 times per week. 7)  Alendronate Sodium 70 Mg Tabs (Alendronate sodium) .Marland Kitchen.. 1 tab by mouth weekly  Diabetes Management Assessment/Plan:      The following lipid goals have been established for the patient: Total cholesterol goal of 200; LDL cholesterol goal of 100; HDL cholesterol goal of 40; Triglyceride goal of 150.  Her blood pressure goal is < 130/80.    Hypertension Assessment/Plan:      The patient's hypertensive risk group is category C: Target organ damage and/or diabetes.  Her calculated 10 year risk of coronary heart disease is 15 %.  Today's blood pressure is 120/72.  Her blood pressure goal is < 130/80.  Lipid Assessment/Plan:      Based on NCEP/ATP III, the patient's risk factor category is "history of diabetes".  The patient's lipid goals are as follows: Total cholesterol goal is 200; LDL cholesterol goal is 100; HDL cholesterol goal is 40; Triglyceride goal is 150.  Her LDL cholesterol goal has been met.    Patient Instructions: 1)  Fasting  lipids, CMET , A1C Dx 250.00, uric acid 274.9 prior to next appt. 2)  Schedule medicare annual wellness  in 6 months.   Orders Added: 1)  Est. Patient Level IV [16109]    Current Allergies (reviewed today): ! CODEINE ! CRESTOR ! LIPITOR

## 2010-06-23 ENCOUNTER — Encounter: Payer: Self-pay | Admitting: Family Medicine

## 2010-07-05 NOTE — Letter (Signed)
Summary: [facility]   Imported By: Kassie Mends 06/28/2010 09:13:46  _____________________________________________________________________  External Attachment:    Type:   Image     Comment:   External Document  Appended Document: Orders Update     Clinical Lists Changes  Observations: Added new observation of EYES COMMENT: 06/2011 (06/28/2010 15:44) Added new observation of DMEYEEXMRES: normal (06/28/2010 15:44) Added new observation of DIAB EYE EX: normal (06/28/2010 15:44)       Diabetes Management History:      The patient is an 77 years old female who comes in for evaluation of Type 2 Diabetes Mellitus.    Diabetes Management Exam:    Eye Exam:       Eye Exam done elsewhere          Date: 06/28/2010          Results: normal          Done by: Elmer Picker  Diabetes Management Assessment/Plan:      The following lipid goals have been established for the patient: Total cholesterol goal of 200; LDL cholesterol goal of 100; HDL cholesterol goal of 40; Triglyceride goal of 150.  Her blood pressure goal is < 130/80.

## 2010-07-28 LAB — CARDIAC PANEL(CRET KIN+CKTOT+MB+TROPI)
CK, MB: 2.4 ng/mL (ref 0.3–4.0)
Relative Index: 1.8 (ref 0.0–2.5)
Total CK: 130 U/L (ref 7–177)

## 2010-07-28 LAB — CBC
Hemoglobin: 14.4 g/dL (ref 12.0–15.0)
MCHC: 33.9 g/dL (ref 30.0–36.0)
MCHC: 34.4 g/dL (ref 30.0–36.0)
MCV: 92.7 fL (ref 78.0–100.0)
Platelets: 202 10*3/uL (ref 150–400)
RBC: 4.52 MIL/uL (ref 3.87–5.11)
RDW: 13.5 % (ref 11.5–15.5)
RDW: 13.9 % (ref 11.5–15.5)

## 2010-07-28 LAB — BASIC METABOLIC PANEL
CO2: 22 mEq/L (ref 19–32)
Calcium: 9 mg/dL (ref 8.4–10.5)
GFR calc Af Amer: >60 mL/min (ref >60)
GFR calc non Af Amer: 57 mL/min — ABNORMAL LOW (ref >60)
Potassium: 4 mEq/L (ref 3.5–5.1)
Sodium: 137 mEq/L (ref 135–145)

## 2010-07-28 LAB — POCT I-STAT, CHEM 8
Calcium, Ion: 1.2 mmol/L (ref 1.12–1.32)
Creatinine, Ser: 1.1 mg/dL (ref 0.4–1.2)
Glucose, Bld: 97 mg/dL (ref 70–99)
HCT: 42 % (ref 36.0–46.0)
Hemoglobin: 14.3 g/dL (ref 12.0–15.0)

## 2010-07-28 LAB — DIFFERENTIAL
Basophils Relative: 0 % (ref 0–1)
Basophils Relative: 0 % (ref 0–1)
Eosinophils Absolute: 0.1 10*3/uL (ref 0.0–0.7)
Eosinophils Absolute: 0.2 10*3/uL (ref 0.0–0.7)
Eosinophils Relative: 1 % (ref 0–5)
Eosinophils Relative: 1 % (ref 0–5)
Lymphs Abs: 2 10*3/uL (ref 0.7–4.0)
Monocytes Absolute: 0.6 10*3/uL (ref 0.1–1.0)
Monocytes Relative: 6 % (ref 3–12)
Neutrophils Relative %: 73 % (ref 43–77)
Neutrophils Relative %: 82 % — ABNORMAL HIGH (ref 43–77)

## 2010-07-28 LAB — GLUCOSE, CAPILLARY
Glucose-Capillary: 110 mg/dL — ABNORMAL HIGH (ref 70–99)
Glucose-Capillary: 112 mg/dL — ABNORMAL HIGH (ref 70–99)
Glucose-Capillary: 113 mg/dL — ABNORMAL HIGH (ref 70–99)

## 2010-07-28 LAB — CULTURE, BLOOD (ROUTINE X 2): Culture: NO GROWTH

## 2010-07-28 LAB — PHOSPHORUS: Phosphorus: 2.9 mg/dL (ref 2.3–4.6)

## 2010-07-28 LAB — LIPID PANEL
Cholesterol: 162 mg/dL (ref 0–200)
HDL: 59 mg/dL (ref >39)
LDL Cholesterol: 84 mg/dL (ref 0–99)
Total CHOL/HDL Ratio: 2.7 RATIO
Triglycerides: 93 mg/dL (ref <150)

## 2010-07-28 LAB — COMPREHENSIVE METABOLIC PANEL
ALT: 17 U/L (ref 0–35)
AST: 21 U/L (ref 0–37)
Alkaline Phosphatase: 61 U/L (ref 39–117)
Calcium: 9.8 mg/dL (ref 8.4–10.5)
GFR calc Af Amer: >60 mL/min (ref >60)
Glucose, Bld: 111 mg/dL — ABNORMAL HIGH (ref 70–99)
Potassium: 3.4 mEq/L — ABNORMAL LOW (ref 3.5–5.1)
Sodium: 141 mEq/L (ref 135–145)
Total Protein: 7.1 g/dL (ref 6.0–8.3)

## 2010-07-28 LAB — POCT CARDIAC MARKERS: Troponin i, poc: <0.05 ng/mL (ref 0.00–0.09)

## 2010-07-28 LAB — URINALYSIS, ROUTINE W REFLEX MICROSCOPIC
Glucose, UA: NEGATIVE mg/dL
Nitrite: NEGATIVE
Protein, ur: NEGATIVE mg/dL
Urobilinogen, UA: 0.2 mg/dL (ref 0.0–1.0)

## 2010-07-28 LAB — URINE MICROSCOPIC-ADD ON

## 2010-07-28 LAB — PROTIME-INR
INR: 0.91 (ref 0.00–1.49)
Prothrombin Time: 12.2 seconds (ref 11.6–15.2)

## 2010-07-28 LAB — URINE CULTURE: Colony Count: 7000

## 2010-08-23 ENCOUNTER — Ambulatory Visit (INDEPENDENT_AMBULATORY_CARE_PROVIDER_SITE_OTHER): Payer: Medicare Other | Admitting: Family Medicine

## 2010-08-23 ENCOUNTER — Ambulatory Visit (INDEPENDENT_AMBULATORY_CARE_PROVIDER_SITE_OTHER)
Admission: RE | Admit: 2010-08-23 | Discharge: 2010-08-23 | Disposition: A | Discharge status: Home / Self Care | Payer: Medicare Other | Source: Ambulatory Visit | Attending: Family Medicine | Admitting: Family Medicine

## 2010-08-23 ENCOUNTER — Encounter: Payer: Self-pay | Admitting: Family Medicine

## 2010-08-23 VITALS — BP 142/80 | HR 80 | Temp 98.0°F | Wt 148.0 lb

## 2010-08-23 DIAGNOSIS — M79642 Pain in left hand: Secondary | ICD-10-CM | POA: Insufficient documentation

## 2010-08-23 DIAGNOSIS — M79609 Pain in unspecified limb: Secondary | ICD-10-CM

## 2010-08-23 NOTE — Assessment & Plan Note (Signed)
Likely strain, though occult fx is possible.  Pt felt better in wrist splint.  Will use this and she'll fu if pain increases or persists.  She is aware of possible occult fx.  I wouldn't proceed with further wu now given the mild pain and improvement in splint.  She agrees.

## 2010-08-23 NOTE — Patient Instructions (Signed)
I would wear the splint for the next week and let us know if the increases.  Take tylenol as needed.  Take care.

## 2010-08-23 NOTE — Progress Notes (Signed)
Fell yesterday.  She was in the yard when she slipped. Fell back onto the ground and likely used L hand to brace the fall.  L hand started to get sore a few hours later.  Had more pain in the night.  "It was hard to get comfortable."   Took some aspirin and tylenol for pain.  Some relief of discomfort with the tylenol, but not the aspirin.  L dorsum of hand is sore.  No LOC with the fall.    NAD Normal rom at L elbow Chronic changes to hands/wrists noted Dec in rom on the L 1st MCP and wrist, some of which is chronic.  Dorsum of the L hand is ttp and puffy, but not on the palmar side. She isn't ttp at the snuffbox.  She has pain with resisted flexion of the thumb.  No tendon deficit noted on exam on digits.  Distally the digits are NV intact.    xrays reviewed.  I d/w rady and no fx/acute change noted.

## 2010-08-25 ENCOUNTER — Telehealth: Payer: Self-pay | Admitting: *Deleted

## 2010-08-25 NOTE — Telephone Encounter (Signed)
It is okay to take ibuprofen 200mg , 2 tabs tid prn for pain.  Take with food.  If she continues to have pain into next week, I needs to see her back.  It is possible that she could have a hairline fracture that didn't show up on the initial xrays.  That would affect the duration of her symptoms.

## 2010-08-25 NOTE — Telephone Encounter (Signed)
Advised pt

## 2010-08-25 NOTE — Telephone Encounter (Signed)
Pt was seen on Tuesday for a hand injury.  Her hand is better but her thumb is red, swollen, feels hot.  She is asking if she should be taking advil instead of tylenol for this. Also asks how long the symptoms will last.  Please advise.

## 2010-09-06 NOTE — Consult Note (Signed)
[name], REIGER              ACCOUNT NO.:  [id number]   MEDICAL RECORD NO.:  [id number]          PATIENT TYPE:  INP   LOCATION:  1414                         FACILITY:  [facility]   PHYSICIAN:  Levert Feinstein, MD          DATE OF BIRTH:  [birth year]   DATE OF CONSULTATION:  01/30/2008  DATE OF DISCHARGE:                                 CONSULTATION   CHIEF COMPLAINT:  Confusion episode.   HISTORY OF PRESENT ILLNESS:  The patient is an 77 year old right-handed  Caucasian female, played Bridge today.  Around 12:00 p.m., finished  playing her Bridge, when she stepped into her car, tried to drive home,  she felt funny and became a little bit confused, but she was able to be  aware of the situation, she later went back to the club. She had a nurse  friend.  She relayed the information of the funny feeling to her friend,  and her friend check on her pulse and called the ambulance later on.  I  reviewed the EMS note, blood pressure was 200/100, heart rate of 90 and  there was no focal neurological deficit noticed.  In specific she was  oriented to time and place at that time, there was no speech difficulty  noticed.   Apparently the whole episode lasted about 1 hour, gradually subsided.  Now she is back to her baseline.   She is back to her baseline now and she reported a similar episode a  year ago while doing grocery shopping, she felt she was not getting  enough blood into her brain, felt dizzy, worried that she could not make  it but the whole episode lasted a few seconds and quickly subsided.  She  could not recall if she had chest pain or chest palpitations at that  time.   PAST MEDICAL HISTORY:  Right pontine cavernous angioma, repeat MRI  continued to demonstrate pontine lesions but there was no acute changes.   There was no acute stroke.   REVIEW OF SYSTEMS:  Pertinent as above.   PAST MEDICAL HISTORY:  1. Hypertension.  2. Diabetes.  3. Coronary artery disease.  4.  Arthritis.  5. Gout.  6. Polyps.  7. Hysterectomy.  8. Cataract surgery.   ALLERGIES:  CODEINE.   CURRENT MEDICATIONS:  Aspirin, glyburide, insulin, Altace, Zocor,  Tylenol and Xanax.   PHYSICAL EXAMINATION:  Temperature was 98.6, blood pressure right now is  151/70, heart rate of 70, respirations of 17.  CARDIAC:  Regular rate and rhythm.  PULMONARY:  Clear to auscultation bilaterally.  NECK:  Supple, no carotid bruits.  NEUROLOGIC:  She is alert, oriented to history  taking, carrying on a  conversation, cranial nerves II-XII, status post cataract surgery, was  otherwise normal.  MOTOR:  Normal tone, bulk and strength.  SENSORY:  Normal to light touch, pinprick.  Deep tendon reflex absent,  was otherwise hypoactive, symmetric.  Plantar responses were flexor.  Coordination normal, finger-to-nose, heel-to-shin.  Gait was narrow,  gait was deferred.   MRI and CT scan of the brain revealed continue evidence  of right pontine  hemangioma but no acute lesion.   LAB EVALUATION:  Troponin negative, CK 104, drug screen negative, UA was  normal and BMP normal, glucose 109.  CBC:  Normal elevated white count,  was otherwise normal.   ASSESSMENT/PLAN:  An 77 year old female with transient confusion, most  suggestive of presyncope, less likely represents central nervous system  etiology such as stroke, or seizure.  1. Continue telemetry monitoring.  2. Frequent orthostatic measurements.      Levert Feinstein, MD  Electronically Signed     YY/MEDQ  D:  01/30/2008  T:  01/31/2008  Job:  [id number]

## 2010-09-06 NOTE — Discharge Summary (Signed)
[name], [name]              ACCOUNT NO.:  [id number]   MEDICAL RECORD NO.:  [id number]          PATIENT TYPE:  INP   LOCATION:  1414                         FACILITY:  [facility]   PHYSICIAN:  Valerie A. Felicity Coyer, MDDATE OF BIRTH:  05-24-1922   DATE OF ADMISSION:  01/30/2008  DATE OF DISCHARGE:  01/31/2008                               DISCHARGE SUMMARY   PRIMARY CARE PHYSICIAN:  Dr. Roxy Manns.   DISCHARGE DIAGNOSES:  1. Confusion and altered mental status, resolved at time of discharge.      Questioned secondary to transient ischemic attack.  2. Presyncope in setting of chronic orthostatic symptoms.   HISTORY OF PRESENT ILLNESS:  [name] is an 77 year old white female  with past medical history of orthostatic hypotension followed by Dr. Juanito Doom, hypertension, hyperlipidemia and type 2 diabetes.  The patient  presented to [facility] Emergency Room on day of admission with report  of episode of confusion on that morning.  The patient reported playing  bridge and remembering feeling not right and hazy throughout her  last game.  The patient then reported walking out to her car and  becoming somewhat confused and disoriented.  The patient's friends  called EMS at that time.   EXAM:  The patient denied any weakness and husband at bedside reported  patient was at baseline mental status on arrival to ER.  The patient did  report recent cold for which she took Sudafed on morning of admission,  states she ate a good breakfast on morning of admission.  She denied any  slurred speech, headache, dizziness, chest pain, shortness of breath,  abdominal pain, nausea, vomiting, diarrhea, dysuria, weakness or recent  fever.  The patient noted to be repeating herself quite often during  admission exam, however, no history of dementia known.  Given the  patient's symptoms and advanced age the patient was admitted for further  evaluation and treatment.   PAST MEDICAL HISTORY:  1.  Hypertension.  2. Hyperlipidemia.  3. Gout.  4. Arthritis.  5. Diet-controlled type 2 diabetes.  6. Small right pontine hemorrhage secondary to cavernous angioma.  7. Status post cataract removal.  8. Orthostatic hypotension followed by Dr. Daleen Squibb.   CONSULTATIONS DURING THIS ADMISSION:  [facility].   COURSE OF HOSPITALIZATION:  Confusion, altered mental status and  weakness of unclear etiology.  Again, patient admitted from emergency  room overnight to rule out TIA versus cardiac versus metabolic etiology.  CT of the head done at time of admission, was negative for any acute  findings.  Cardiac enzymes were negative x3.  MRI/MRA of the brain was  also negative for any acute abnormalities, however, did reveal old  infarct as mentioned in past medical history.  The patient with no signs  or symptoms of infection with normal white cell count and afebrile.  A  2D echocardiogram revealed left ventricular ejection fraction of 55% -  60% with no wall motion abnormalities.  Again the patient's symptoms  thought perhaps secondary to TIA versus presyncopal event in setting of  chronic orthostatic hypotension.  From neurological and  cardiac point of  view patient was felt medically stable for discharge home on January 31, 2008.  The patient was seen in consultation by physical therapy prior to  discharge with no home physical therapy needs recognized.   MEDICATIONS AT TIME OF DISCHARGE:  1. Zocor 40 mg p.o. daily.  2. Altace 2.5 mg p.o. daily.  3. Aspirin 325 mg half tablet daily.  4. Prilosec OTC 20 mg p.o. daily.  5. Xanax 0.5 mg p.o. nightly p.r.n.  6. Glyburide 2.5 mg p.o. p.r.n.   DISPOSITION:  Again the patient felt medically stable for discharge home  at this time.  The patient is instructed to follow up with her primary  care physician, Dr. Roxy Manns, in 2 weeks post discharge.  The patient  and husband instructed to call the office to make this  appointment.      Cordelia Pen, NP      Raenette Rover. Felicity Coyer, MD  Electronically Signed    LE/MEDQ  D:  02/21/2008  T:  02/21/2008  Job:  [id number]   cc:   Marne A. Tower, MD  [address] [place], [place] [address]

## 2010-09-06 NOTE — Assessment & Plan Note (Signed)
[facility] OFFICE NOTE   NAME:Hazelett, KAYLAANN                       MRN:          [id number]  DATE:08/24/2006                            DOB:          11-06-1922    Ms. Dorisann Schwanke is a patient of mine with significant orthostatic  hypotension, one episode of syncope, and recurrent dizzy spells.  She  has had a complete evaluation including an echocardiogram which was  normal, carotid Doppler which showed no obstructive carotid disease, and  electroencephalogram which was negative.   With changing of her medication her orthostatic hypotension is  remarkably better.  When I saw her last in the office she was doing  well.   She is cleared for surgery.  I think she is low operative risk.     Thomas C. Daleen Squibb, MD, [facility]  Electronically Signed    TCW/MedQ  DD: 08/24/2006  DT: 08/24/2006  Job #: [id number]   cc:   Loralie Champagne

## 2010-09-06 NOTE — Assessment & Plan Note (Signed)
[facility]                            CARDIOLOGY OFFICE NOTE   NAME:[name], [name] CHAIRES                     MRN:          [id number]  DATE:01/28/2008                            DOB:          1922/08/08    [name] comes in to further manage her orthostatic hypotension.   She had her knee surgery back in the summer in [place] and did  remarkably well.  She is probably going to have her right knee done now.  She was asking about orthopedic surgeons in [place], I recommended  Dr. Ollen Gross.   She denies any orthostatic symptoms.  Her blood pressures running around  140/80 on the average.   Her current meds are Altace 2.5 mg a day, simvastatin 40 mg a day,  enteric-coated aspirin 325 daily, multivitamin daily.   PHYSICAL EXAMINATION:  VITAL SIGNS:  Her blood pressure today is 144/88,  her pulse 67 and regular, her weight is 151, which is up 4 pounds.  HEENT:  Normal.  NECK:  Carotids upstrokes are equal and bilateral bruits.  No JVD.  Thyroid is not enlarged.  Trachea is midline.  LUNGS:  Clear to auscultation and percussion.  HEART:  Regular rate and rhythm.  No gallop.  ABDOMEN:  Soft, good bowel sounds.  No midline bruit.  No pulsatile  mass.  EXTREMITIES:  No edema.  Pulses are intact.  NEURO:  Intact.   EKG shows sinus rhythm, left axis deviation.  No significant change  since her last ECG.   Her lipids are at goal with Dr. Milinda Antis.  Recent blood work in general was  normal.   ASSESSMENT AND PLAN:  [name] is doing well.  I think an optimal  blood pressure for her is about 140/80.  If she needs preoperative  clearance for her right knee, we will give that.  She is at low  operative risk.  I will see her back again in a year.     Thomas C. Daleen Squibb, MD, [facility]  Electronically Signed    TCW/MedQ  DD: 01/28/2008  DT: 01/29/2008  Job #: [id number]

## 2010-09-09 NOTE — Consult Note (Signed)
NAMEPARTHENA, [name]              ACCOUNT NO.:  [id number]   MEDICAL RECORD NO.:  [id number]          PATIENT TYPE:  INP   LOCATION:  4707                         FACILITY:  [facility]   PHYSICIAN:  Deanna Artis. Hickling, M.D.DATE OF BIRTH:  [birth year]   DATE OF CONSULTATION:  04/25/2006  DATE OF DISCHARGE:                                 CONSULTATION   CHIEF COMPLAINT:  Dizziness, presyncope.   HISTORY OF PRESENT ADDITION:  [name] is an 77 year old woman  with a history of two episodes of light-headedness, one that occurred on  the weekend before Christmas and the other that occurred on Monday  before Christmas.  These were very brief and were symptomatic only to  the patient not to those who were with her.  The patient had a very  transient episode of feeling light-head and as if the world went black.  She did not lose consciousness nor did she lose her posture.  She did  not have nausea, chest pain, or diaphoresis nor did she have any focal  neurologic deficits including seizure like activity.   The patient was seen by the physicians assistant at [facility]  who recommended a cardiac consultation.  The patient was seen by Dr. Juanito Doom who found no intrinsic cardiac disease but recommended that the  patient come to the hospital if she had further episodes and, in  addition, recommended that she have a cardiac stress test evaluation.  That was scheduled for tomorrow.   This morning, the patient awakened and felt well.  While fixing  breakfast, she felt light headed.  This episode was different from the  others in that it was prolonged on the order of 10-15 minutes.  She sat  down and staggered a bit as she went to sit.  She remained light headed.  Her husband gave her some juice thinking that she might be hypoglycemic.  She did not have nausea, diaphoresis, chest pain, nor did she have any  convulsive activity or periods of time when she was unresponsive.  She  did  not lose her vision nor did it darken as it had in the previous two  times.  She presented to the emergency room for evaluation.  She did not  have significant problems with hypotension, indeed, her blood pressure  initially was 159/76.  She did not have significant cardiac arrhythmia  nor was she febrile.   The patient had a CT scan of the brain which showed diffuse cerebral  atrophy, right pontine calcification compatible with an occult vascular  malformation, and a sub-cm basal ganglia lacunar infarctions versus  prominent perivascular spaces.  The patient had a basic metabolic panel  that was normal. CBC was similarly fairly normal other than a slightly  elevated white count of 12,100 and 80% neutrophils, mild leukopenia of  11% however, lymphocytes were normal at 1400.  Urinalysis showed 3-5  white blood cells but negative nitrate and small leukocyte esterase, so  was equivocal for urinary tract infection.   The patient was seen by cardiology who felt that she should be admitted  for  further evaluation and requested neurologic consultation to  determine whether or not further neurologic workup is also indicated.   PAST MEDICAL HISTORY:  The patient was admitted to [facility]  on May 21, 2004, with altered mental status and unsteadiness.  CT  scan of the brain showed evidence of a small right pontine mid brain  hemorrhage without significant mass effect, hydrocephalus, or  intravascular intraventricular extension.  The patient was thought to  have a cavernous angioma that had bled.  She was noted to have risk  factors for stroke including dyslipidemia, hypertension, and type 2  diabetes mellitus as well as ischemic heart disease.   She has been followed at [facility] on two occasions  since that time, the last February 24, 2005.  At that time, she had a  nonfocal examination and no complaints. Recommendations were made for  her to continue aspirin  for stroke prevention and control of her  diabetes, hypertension, dyslipidemia.  Plans were made to see her again  in the spring of 2007 but she has been lost to follow-up.   During hospitalization in 2006, the patient had chest x-ray which did  not show evidence of acute disease.  EKG that showed a sinus rhythm with  premature ventricular complexes and possible anterior infarct of  undetermined age.  Follow up showed nonspecific ST-T wave abnormalities.  2-D echocardiogram showed ejection fraction of 55-65% with no left  ventricular wall abnormalities or embolic source.   Other medical problems include gout, osteoarthritis particularly severe  in her right knee but also in her fingers.   PAST SURGICAL HISTORY:  Bilateral iridectomies, hysterectomy, rectal  polypectomy.   MEDICATIONS:  Include aspirin 1/2 of a 325 mg tablet daily, Vytorin  10/20, one daily, ranitidine 150 mg twice daily, hydrochlorothiazide 25  mg daily, Altace 5 mg daily, Milk of Magnesia at nighttime.   DRUG ALLERGIES:  None.  Intolerances to codeine with nausea and  vomiting, Altace with palpitations, Crestor pruritus, and Lipitor muscle  cramps.   FAMILY HISTORY:  Mother died in her late 14s of congestive heart  failure.  Father died at age 17 of a stroke.  The patient has had a  brother and sister both of whom have significant problems with  congestive heart failure but smoked and drank heavily.  The patient  has three living children who are healthy.  I gather there were two  other siblings whose medical history is unknown.   REVIEW OF SYSTEMS:  12 system review is recorded on the chart and was  positive for problems with her vision related to cataracts and wearing  glasses and presbycusis.  Pain in her joints particularly the left arm,  neck and knees.  12 system review is otherwise negative.  See cardiology  H&P for further details.  SOCIAL HISTORY:  The patient lives in [place] with her husband,  they  have been married for 56 years.  She is a remote smoker but quit 40  years ago and drinks alcohol very rarely.   PHYSICAL EXAMINATION:  GENERAL:  This is a very pleasant woman no acute  distress.  VITAL SIGNS:  Temperature 97.7, blood pressure 116/70, resting pulse  116, respirations 18, oxygen saturation 97%.  Capillary glucose 223.  HEENT:  Supple neck, full range of motion.  No meningismus.  LUNGS:  Clear.  HEART:  No murmurs.  Pulses normal with regular rate rhythm.  ABDOMEN:  Soft.  Bowel sounds normal.  No hepatosplenomegaly  or  tenderness.  EXTREMITIES:  Normal other than a painful swollen right knee and  osteoarthritis of her fingers.  NEUROLOGIC EXAMINATION:  The patient is right handed.  She is alert,  pleasant without dysarthria or dysphagia.  Cranial nerves reveal round  reactive pupils.  Fundi normal.  Visual fields full to double  simultaneous stimuli.  Symmetric facial strength. Midline tongue and  uvula.  Air conduction greater than bone conduction.  Motor examination  normal.  No drift  Excellent strength x4.  Fine motor movements clumsy.  Finger tapping on the left is clumsy.  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  Normal  proprioception and stereognosis.  Cerebellar examination shows finger-to-  nose and rapid repetitive movements are okay.  Her gait is mildly  antalgic because of the right knee.  She has some loss of balance when  her gait is narrowed falling slightly to the right.  Deep tendon  reflexes are diminished.  The patient had bilateral flexor plantar  responses.   IMPRESSION:  1. Dizzy, 780.4, without loss of awareness.  The patient is not having      vertigo.  She had two episodes of presyncope where her vision got      black that were transient and not apparent to observers.  These      lasted for seconds. Today was much longer, lasting ten minutes.  2. Prior history of brain stem  hemorrhage secondary to possible      cavernous angioma.  3. Risk factors for stroke including type 2 diabetes mellitus,      dyslipidemia, and hypertension.   PLAN:  1. MRI of the brain without and with contrast to check the venous      angioma.  2. EEG.  3. Cardiac workup for syncope.   I appreciate the opportunity to participate in her care.      Deanna Artis. Sharene Skeans, M.D.  Electronically Signed     WHH/MEDQ  D:  04/25/2006  T:  04/25/2006  Job:  [id number]   cc:   Salvadore Farber, MD  [name], MD

## 2010-09-09 NOTE — Discharge Summary (Signed)
[name], [name]              ACCOUNT NO.:  [id number]   MEDICAL RECORD NO.:  [id number]          PATIENT TYPE:  INP   LOCATION:  3027                         FACILITY:  [facility]   PHYSICIAN:  [name], MD    DATE OF BIRTH:  [birth year]   DATE OF ADMISSION:  05/21/2004  DATE OF DISCHARGE:  05/24/2004                                 DISCHARGE SUMMARY   CONTINUATION:   LABORATORY DATA:  Hemoglobin A1c 6.3.  Cholesterol 142, triglycerides 96,  HDL 50 and LDL 73.  Chemistries were normal except for a glucose at 193.  Urine drug screen was negative.  Cardiac enzymes were negative.   HISTORY OF PRESENT ILLNESS:  The patient is a 77 year old white female who  had the sudden onset of feeling unwell and heaviness in the head at  approximately 10:30 on the morning of admission.  Her husband called the  ambulance who brought her to the emergency room and she started feeling  better once she got here.  By 3:00 p.m. she was completely back to normal.  She denies any prior history of stroke or other neurologic problems.  She  was not a TPA candidate with total resolution of symptoms. She was admitted  to the hospital for further workup.   HOSPITAL COURSE:  MRI did reveal a small right pontine mid brain hemorrahge  that was felt to be associated with a cavernous angioma.  She was on aspirin  at home prior to admission and plans are to hold the aspirin for now and  then resume in three weeks. She needs to continue risk factor control for  her lipids, hypertension and diabetes.  Therapy evaluated her and she had no  therapy needs.  She was discharged home to the care of her husband.   FOLLOW UP:  1.  She will follow-up with [name], M.D. in two to three months.  2.  Follow-up with Marne A. Tower, M.D. [facility] in two weeks.   DISCHARGE CONDITION:  Neurologically normal.   DISPOSITION:  1.  Discharge the patient home.  No therapy is needed.  2.  Resume aspirin in three weeks.  3.   Tight risk factor control.      ________________________________________  Annie Main, N.P.  ___________________________________________  Sunny Schlein. Pearlean Brownie, MD   SB/MEDQ  D:  05/24/2004  T:  05/24/2004  Job:  [id number]   cc:   [name], MD  Fax: [phone]   Idamae Schuller A. Milinda Antis, M.D. [facility]

## 2010-09-09 NOTE — H&P (Signed)
[name], [name]              ACCOUNT NO.:  [id number]   MEDICAL RECORD NO.:  [id number]          PATIENT TYPE:  INP   LOCATION:  1829                         FACILITY:  [facility]   PHYSICIAN:  [name], MD    DATE OF BIRTH:  June 09, 1922   DATE OF ADMISSION:  05/21/2004  DATE OF DISCHARGE:                                HISTORY & PHYSICAL   REFERRING PHYSICIAN:  Dr. Effie Shy   CLINICAL HISTORY:  [age]-year-old lady with sudden onset of a feeling  of unwellness and heaviness in the head and feeling just not right at about  10:30 this morning.  She denied any specific headache, loss of vision,  double vision, vertigo, gait or balance difficulties.  Her husband called  the ambulance and brought her to the hospital and she started improving  after she got here and by 3 p.m. she states she was feeling completely back  to normal.  She denies any prior known history of stroke, TIA, significant  neurologic problems.   PAST MEDICAL HISTORY:  Hypertension, hyperlipidemia, diabetes, ischemic  heart disease.   HOME MEDICATIONS:  Altace, hydrochlorothiazide, Lipitor, milk of magnesia.   SOCIAL HISTORY:  She lives at home and does not smoke or drink.  She is  independent in activities of daily living.  Primary physician is Dr. Roxy Manns.   REVIEW OF SYSTEMS:  Not significant for recent fever, loss of appetite,  weight, cough, chest pain, diarrhea.   PHYSICAL EXAMINATION:  Pleasant elderly lady who is not in distress.  She is  afebrile, pulse of 72 per minute regular, respiratory rate 16 per minute,  blood pressure on arrival was reported as 190 systolic but it is now down to  157/90.  Distal pulses are well felt, head is nontraumatic, neck is supple  without bruit, ENT exam unremarkable, cardiac exam no murmur or gallop,  lungs clear to auscultation.  Neurologic exam:  She is awake, alert,  oriented x3 with normal speech and language function, there is no aphasia,  apraxia or  dysarthria, she has intact _________.  Pupils equal/reactive to  light and accommodation, face is symmetric, palatal movements are normal,  tongue is midline.  Motor system exam reveals symmetrical upper and lower  extremity strength, tone, reflexes, coordination, sensation, her gait was  not tested.   DATA REVIEWED:  Noncontrast CAT scan of the head done today shows a small  _________ of linear hyperdensity in the right midbrain-pontine junction that  is consistent with small hemorrhage.  MRI scan of the brain done  subsequently shows a 6 mm x 10 mm acute hemorrhage in the right  pontomedullary junction without any surrounding mass effect or  hydrocephalus.  Etiology of this is unclear but possibly cavernous angioma  versus hypertensive hemorrhage.   PLAN:  Patient is being admitted to the stroke service for observation and  monitoring of her blood pressure.  Use IV labetalol to keep systolic blood  pressure below 160.  Continue on home antihypertensives at the previous  dosages.  Repeat CT scan of the head in the morning.  I had a  long  discussion with patient and family members and answered questions.      PPS/MEDQ  D:  05/21/2004  T:  05/21/2004  Job:  [id number]   cc:   [name], M.D. [facility]

## 2010-09-09 NOTE — Assessment & Plan Note (Signed)
[facility]ALTHCARE                            CARDIOLOGY OFFICE NOTE   NAME:Kinker, MONAI HINDES                     MRN:          [id number]  DATE:05/09/2006                            DOB:          18-Aug-1922    Ms. Riling returns today after being discharged from the hospital with  syncope.  We found her to be extremely orthostatic and cut her Altace in  half, and eliminated her hydrochlorothiazide.   She had an extensive neurological workup as outlined in the discharge  summary.  This all turned out to be negative.  She also had a 2D  echocardiogram which showed normal left ventricular function with mild  mitral regurgitation.  Her blood work was unremarkable.   Since discharge she has had no other spells.   MEDICATIONS:  Today include:  1. Altace 2.5 mg daily.  2. Vytorin 10/20 daily.  3. Aspirin 325 one-half tablet daily.  4. Prevacid.   PHYSICAL EXAMINATION:  Her blood pressure is 156/83 lying down, her  heart rate is 76, standing it went to 148/78 with a pulse of 76, and  then standing for 5 minutes it went to 141/80, pulse of 78.  This is a  significant improvement over her hospital values.  Remarkably, when I saw her in the [place] office initially she was  not orthostatic!  The rest of her exam is unchanged.   She had numerous questions about the Vytorin controversy.  I told her as  long as her LDL was as low as possible, that it was probably the best  drug for her.  She has an INTOLERANCE TO LIPITOR.  She will follow up  blood work with Dr. Milinda Antis.   I will plan on seeing her back in 3 to 6 months.     Thomas C. Daleen Squibb, MD, [facility]  Electronically Signed    TCW/MedQ  DD: 05/09/2006  DT: 05/10/2006  Job #: [id number]   cc:   [name], MD

## 2010-09-09 NOTE — Procedures (Signed)
EEG NUMBER:  11-7.   HISTORY:  This is an 77 year old patient who is being evaluated for  episodes of near syncope.  This is a routine EEG.  No skeletal effects  are noted.   EEG CLASSIFICATION:  Normal awake.   DESCRIPTION OF RECORDING:  Background rhythm consists of a fairly well  modulated medium-amplitude, alpha rhythm of 10 Hz that is reactive to  eye opening and closure.  As the record progresses, the patient appears  to remain in the waking state throughout the recording.  Photic  stimulation is performed resulting in a bilateral and symmetric photic  drive response.  Hyperventilation was not performed.  At no time during  recording did there appear to be evidence of spike or spike-wave  discharges, or evidence of focal slowing.  EKG monitor shows very  occasional premature ventricular complexes with a heart rate of 66.   IMPRESSION:  This is a normal EEG recording in the waking state.  No  evidence of ictal or interictal discharges were seen.      Marlan Palau, M.D.  Electronically Signed     UEA:VWUJ  D:  04/26/2006 14:52:17  T:  04/26/2006 15:56:54  Job #:  [id number]

## 2010-09-09 NOTE — Assessment & Plan Note (Signed)
[facility]ALTHCARE                                 ON-CALL NOTE   NAME:Gruszka, GARRETT BOWRING                     MRN:          [id number]  DATE:09/30/2006                            DOB:          Oct 07, 1922    TELEPHONE NUMBER:  [phone]   REGULAR DOCTOR:  Dr. Milinda Antis   CHIEF COMPLAINT:  High sugar. The patient saw me on Friday. She has had  increased sugars after knee replacement surgery and was recently put on  Glucotrol XL by another physician at 2.5 mg per day. At her visit on  Friday, we increased it to twice a day if her blood sugar was over 150.  Friday morning she said that her sugar in the morning was 79 and in the  evening it was 159, so she did take another Glucotrol, however this  morning her sugar was low, it was below 70, and she said that made her  nervous. She did not take her Glucotrol this morning and at 3:20 this  afternoon her sugar was 351, so she took another Glucotrol XL and now it  is 207, and she was wanting further instructions. I told her not to take  another Glucotrol tonight, because it might lower her sugar too much  overnight and that she should continue her regular routine, watch her  sugar tonight and if it goes up higher, she is to call me back.  Otherwise, the instructions were that if her sugar was under 100 in the  morning not to take the Glucotrol XL and if her sugar is over 200 in the  afternoon and to go ahead and take a dose, and she will check in with me  tomorrow.     Marne A. Tower, MD  Electronically Signed    MAT/MedQ  DD: 09/30/2006  DT: 10/01/2006  Job #: [id number]

## 2010-09-09 NOTE — Assessment & Plan Note (Signed)
[facility]ALTHCARE                                   ON-CALL NOTE   NAME:[name], ODELLE KOSIER                     MRN:          [id number]  DATE:12/30/2005                            DOB:          29-Jan-1923    Telephone triage note on September 8, time received 6:37 p.m.  The patient  is [name], the caller is the same.  She sees Dr. Milinda Antis.  Date of  birth [birth year].  Telephone [phone].  The patient has had diet-  controlled diabetes for some while and has done fine.  Yesterday she had a  cortisone shot in one of her knees at Dr. Lorin Picket Dean's office.  She is aware  that these can elevate her blood sugars, but she became alarmed when her  blood sugar tonight was 195.  She does feel fine, she asks for advice.  I  reassured her that this elevation in her blood sugars is temporary, and  probably will get back to normal within the next week.  She is to eat her  normal diet.  She should drink plenty of water this evening.  If she feels  bad, or if her blood sugars go over 250, she is to call me back.                                   Tera Mater. Clent Ridges, MD   SAF/MedQ  DD:  12/30/2005  DT:  12/31/2005  Job #:  [id number]   cc:   [name], MD

## 2010-09-09 NOTE — Op Note (Signed)
[name], [name]                          ACCOUNT NO.:  [id number]   MEDICAL RECORD NO.:  [id number]                   PATIENT TYPE:  AMB   LOCATION:  DSC                                  FACILITY:  [facility]   PHYSICIAN:  Katy Fitch. Naaman Plummer., M.D.          DATE OF BIRTH:  [birth year]   DATE OF PROCEDURE:  12/24/2001  DATE OF DISCHARGE:                                 OPERATIVE REPORT   PREOPERATIVE DIAGNOSES:  Chronic entrapment neuropathy of median nerve,  right carpal tunnel.   POSTOPERATIVE DIAGNOSES:  Chronic entrapment neuropathy of median nerve,  right carpal tunnel.   OPERATION PERFORMED:  Release of right transverse carpal ligament.   SURGEON:  Katy Fitch. Sypher, M.D.   ASSISTANT:  Jonni Sanger, P.A.   ANESTHESIA:  IV regional, forearm level.   SUPERVISING ANESTHESIOLOGIST:  Maren Beach, M.D.   INDICATIONS FOR PROCEDURE:  The patient is a right hand dominant woman with  chronic numbness in both hands.  Clinical examination suggested bilateral  carpal tunnel syndrome.  Due to failure to respond to nonoperative measures,  she is brought to the operating room at this time for release of her right  transverse carpal ligament.   DESCRIPTION OF PROCEDURE:  The patient was brought to the operating room and  placed in supine position upon the operating table.  Following placement of  an IV regional block at the forearm level, the right arm was prepped with  Betadine soap and solution and sterilely draped.  When anesthesia was  satisfactory, the procedure commenced with a short incision in line with the  ring finger in the palm.  Subcutaneous tissues are carefully divided  revealing the palmar fascia.  This was split in the line of its fibers to  reveal the common sensory branch of the median nerve and the superficial  palmar arch.  The common sensory branches were followed back to the median  nerve proper.  The nerve was then separated from the transverse  carpal  ligament.  The ligament was released on its ulnar border extending to the  distal forearm.  This widely opened the carpal canal.  No masses or other  predicaments were noted.  Bleeding points along the margin of the released  ligament were electrocauterized with bipolar current.   The wound was then inspected for masses and no other predicaments were  noted.  The wound was then repaired with intradermal  3-0 Prolene suture.  A compressive dressing was applied with a volar plaster  splint maintaining the wrist in five degrees dorsiflexion.  The patient was transferred to the recovery room with stable vital signs.   For aftercare she was given a prescription for Darvocet-N 100 one or two  tablets p.o. q.4-6h. p.r.n. pain.  She will return to our office for follow-  up in 7 to 10 days to begin an exercise program.  Katy Fitch Naaman Plummer., M.D.    RVS/MEDQ  D:  12/24/2001  T:  12/24/2001  Job:  [id number]   cc:   Windle Guard, M.D.

## 2010-09-09 NOTE — Op Note (Signed)
[name], [name]              ACCOUNT NO.:  [id number]   MEDICAL RECORD NO.:  [id number]          PATIENT TYPE:  AMB   LOCATION:  ENDO                         FACILITY:  [facility]   PHYSICIAN:  James L. Malon Kindle., M.D.DATE OF BIRTH:  [birth year]   DATE OF PROCEDURE:  04/22/2004  DATE OF DISCHARGE:                                 OPERATIVE REPORT   PROCEDURE PERFORMED:  Colonoscopy and polypectomy.   ENDOSCOPIST:  Llana Aliment. Randa Evens, M.D.   MEDICATIONS:  Fentanyl 130 mcg, Versed 13 mg IV.   INSTRUMENT USED:  Pediatric Olympus adjustable colonoscope.   INDICATIONS FOR PROCEDURE:  Patient with a prior history of colon polyp.  This is done as a five year follow up.  The last colonoscopy was negative.   DESCRIPTION OF PROCEDURE:  The procedure had been explained to the patient  and consent obtained.  With the patient in the left lateral decubitus  position, the pediatric adjustable Olympus colonoscope was inserted and  advanced.  The prep was excellent.  We were able to reach the cecum using  some abdominal pressure.  The ileocecal valve and appendiceal orifice were  seen.  The scope was withdrawn and the cecum, ascending colon, transverse  colon were seen well and were unremarkable.  In the descending colon, two  polyps were found, each 0.5 to 1 cm in diameter approximately 5 cm apart.  They were snared and both placed in jar #1.  The patient did have  diverticular disease of the sigmoid colon, had a 1.5 cm polyp on a long  stalk in the sigmoid colon at approximately 30 cm.  Due to the fixed sigmoid  and thickening due to the diverticular disease, we had to spin her in  several different positions and finally in the right lateral decubitus  position, we were able to get in position to get a snare around the polyp  and it was transected and sucked up against the scope and recovered.  There  was no bleeding at any of the polypectomy sites.  The scope was withdrawn.  The patient  tolerated the procedure well.   ASSESSMENT:  Multiple polyps removed, 2113.   PLAN:  Routine postpolypectomy instructions.  Will recommend repeating  procedure in two years.       JLE/MEDQ  D:  04/22/2004  T:  04/22/2004  Job:  [id number]   cc:   [name], M.D. [facility]

## 2010-09-09 NOTE — Assessment & Plan Note (Signed)
[facility] OFFICE NOTE   NAME:[name]                     MRN:          [id number]  DATE:04/19/2006                            DOB:          [birth year]    CHIEF COMPLAINT:  Dizzy spells twice over the last few days and feeling  like I was going to pass out.   HISTORY OF PRESENT ILLNESS:  [name] is a delightful 77-year-  old married white female who comes today because of the above complaint.   The Saturday before Christmas, she was standing in food [place] and  suddenly  became presyncopal.  She did not black out.  The only symptoms  she had was that she was loosing her vision bilaterally.  There were no  other specific neurological complaints such as headache, vertigo, any  paresthesias, amaurosis.   This happened again a couple of days ago while standing with her family.  Each time this happened, she was standing still.  She was not just  walking or had just stood up.   She denies any tachy palpitations.  She does feel her heartbeat hard  when she walks but has no shortness of breath or angina.   PAST MEDICAL HISTORY:  1. 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  At that time, she presented with not feeling      well and heaviness in her head.  She denied any specific headache      at that time, loss of vision, double vision, vertigo or balance      problems.  This was treated conservatively.  2. History of hypertension .  3. Hyperlipidemia.  4. Type 2 diabetes.  5. History of gout.  6. 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  7. Cataract surgery .  8. Hysterectomy.  9. Rectal polyp removed.   CURRENT MEDICATIONS:  1. Aspirin half of 325 mg daily.  2. Vytorin 10/20 daily.  3. Ranitidine 150 b.i.d.  4. HCTZ 25 mg daily.  5. Milk of Magnesia at night.  6. Zantac 150 mg daily.   ALLERGIES:  INTOLERANCE TO CODEINE WITH NAUSEA AND VOMITING,  PALPITATIONS WITH ALTACE IN THE PAST.  APPARENTLY HAD SOME PRURITUS WITH  CRESTOR.  INTOLERANCE TO LIPITOR AS WELL.   HOSPITAL COURSE:  At the time of her pons hemorrhage, she had an  echocardiogram which showed an EF of 55-65% with no wall motion  abnormalities and no embolic source.   SOCIAL HISTORY:  She is a housewife.  She is married and has three  children.   FAMILY HISTORY:  Noncontributory.   REVIEW OF SYSTEMS:  Unremarkable.  She denies any melena, hematochezia,  recent weight loss or gain, any endocrinological complaints.   PHYSICAL EXAMINATION:  VITAL SIGNS:  Blood pressure lying was 145/86,  pulse 69 and regular, sitting was 151/68 with pulse of 69, feeling a  little light headed.  After 2 minutes, she felt like she  was going to  pass out.  Her pulse was regular at 74 beats per minute.  Her blood  pressure actually had increased to 171/91.  After 5 minutes, she still  felt a little dizzy.  Blood pressure was 166/94, pulse 76 and regular.  She is 5 feet 5 inches, weight 148 pounds.  HEENT:  Normocephalic, atraumatic.  PERRLA.  EOMI.  Sclerae are clear.  There is no unusual nystagmus.  There is no vertigo with quick head  turning.  Her dentition is satisfactory.  There is no facial droop.  NECK:  Carotids are full without bruits.  There is no JVD.  Thyroid is  not enlarged.  Trachea is midline.  LUNGS:  Clear to auscultation and percussion.  HEART:  Nondisplaced PMI.  She has a normal S1, S2.  ABDOMEN:  Soft with good bowel sounds.  No tenderness.  No hepatomegaly.  EXTREMITIES:  No cyanosis, clubbing.  There is trace edema.  Pulses were  present but reduced.  NEUROLOGICAL:  Unsteadiness to heal-to-toe, but her other cerebellar  function seems to be normal.  Her motor and sensory exam is grossly  intact.  Cranial nerves are grossly intact.  MUSCULOSKELETAL:  Other than chronic arthritic changes and gouty   arthritis in her hands are negative.  SKIN:  Thin and a few little ecchymoses.  There is no sign of trauma.   STUDIES:  Electrocardiogram yesterday showed sinus rhythm with poor R  wave progression across the anterior precordium.  There were no acute  changes.  PR, QRS and QTc intervals were normal   LABORATORY DATA:  Recent laboratory data from Dr. Royden Purl office was  also unremarkable.   ASSESSMENT:  Presyncope.  Historically, this sounds more neurological  than it does cardiovascular.  Checking orthostatics with no drop in  reproducing her symptoms and also associated with a regular pulse, rules  out orthostatic hypotension or dysrhythmias.  She is diabetic, has  hypertension, hyperlipidemia and her age, so she clearly has some  coronary disease.   RECOMMENDATIONS:  1. Continue current medications.  2. If feels presyncopal, was told to sit down or lie down to avoid      falls or trauma.  3. See Dr. Pearlean Brownie at [facility] as soon as possible.  4. A 2D echocardiogram, carotid Dopplers and Adenosine Myoview.   FOLLOWUP:  I will schedule to follow up with me in 3-4 weeks to review  these tests and see how she is doing.     Thomas C. Daleen Squibb, MD, [facility]  Electronically Signed    TCW/MedQ  DD: 04/19/2006  DT: 04/19/2006  Job #: [id number]   cc:   [name], MD  [name], MD  [name], MD

## 2010-09-09 NOTE — H&P (Signed)
NAMESIARRA, [name] NO.:  [id number]   MEDICAL RECORD NO.:  [id number]          PATIENT TYPE:  INP   LOCATION:  1825                         FACILITY:  [facility]   PHYSICIAN:  Salvadore Farber, MD  DATE OF BIRTH:  1922-11-15   DATE OF ADMISSION:  04/25/2006  DATE OF DISCHARGE:                              HISTORY & PHYSICAL   Primary cardiologist, Jesse Sans. Wall, MD.  The patient is being seen by  Dr. Randa Evens today.  Primary care is Marne A. Tower, MD.  Neurologist is [name], MD.   [name] is a very pleasant 77 year old Caucasian female.  No known  history of coronary artery disease.  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 Pearlean Brownie, diagnosed apparently in 2006.  [name] saw  Dr. Daleen Squibb recently in our [place] office secondary to dizzy spells x2  episodes in the setting of presyncope.  This was her initial visit with  [facility].  Apparently the Saturday before Christmas the  patient was standing in [employer] and suddenly became presyncopal.  She  states she did not black out.  The only symptoms she had was that she  lost her vision bilaterally.  She denies any other specific neurological  complaints.  Apparently this happened again few days later while  standing with her family.  The patient had not gone from lying or  sitting position.  She states she had been standing still for a little  while, had not had any change in position.  She denied palpitations with  either episode.  When she saw Dr. Daleen Squibb he had arranged for the patient  to have reevaluation at [facility], also ordered a 2-D  echocardiogram, carotid Dopplers and an adenosine Myoview, which the  patient had these tests scheduled for tomorrow in our office.  Today,  however, the patient had another episode of presyncope while fixing  breakfast this morning.  She states similar  situation.  Denied  lightheadedness but states she was mildly dizzy.  No palpitations, but  she states that sometimes it feels like her heart stops and then starts  again.  She also complained of haloes in her vision and some decrease in  visual acuity over the last couple of weeks.  This morning the patient  states same scenario.  She sat down and put her head down but did not  resolve the feeling.  She thought maybe her sugar was low so she drank  some orange juice.  9-1-1 was called.  EMS arrived, checked a CBG, which  the patient reports was 250.  The patient states she did not actually  fall.  Was transported to [facility] for further evaluation.   ALLERGIES:  CODEINE makes her nauseated.  Apparently has an allergic-  reaction to CRESTOR and LIPITOR in the form of pruritus.   MEDICATIONS:  1. Include Altace 5 mg.  2. Zantac 150 mg.  3. Hydrochlorothiazide 25 mg.  4. Aspirin 162 mg.  5. Vytorin 10/20 mg.   PAST MEDICAL HISTORY:  1. Hypertension.  2. Hyperlipidemia.  3. Gout.  4. Arthritis.  5. Right carpal tunnel release.  6. Diabetes.  7. Colon polyps.  8. Echocardiogram in 2006 showed an EF of 55-65%.  9. Small right pontine membrane junction hemorrhage secondary to      cavernous angioma.  10.Status post hysterectomy.  11.Status post rectal polyp removal.  12.Cataract surgery.   SOCIAL HISTORY:  She lives in [place] with her husband.  She has  three adult children.  Denies ever using tobacco, EtOH, drugs or herbal  medication.  No diet restrictions.  Activity limited secondary to knee  discomfort/arthritis.   FAMILY HISTORY:  Mother deceased at age 74, unknown cause.  Father  deceased at age 62 secondary to CVA.  Four siblings deceased from CHF  and old age.   REVIEW OF SYSTEMS:  Positive for some visual disturbances, questionable  palpitations, presyncope, weakness.  Left arm, neck and knee pain.   PHYSICAL EXAMINATION:  VITAL SIGNS:  The patient is  afebrile, pulse 79  and regular, respirations 18.  Orthostatic vitals:  Lying, 142/65 with  heart rate of 66; sitting, 148/78 with heart rate of 70.  Respirations  18.  Saturation 98% on room.  GENERAL:  [name] is a very pleasant 77 year old Caucasian female in  no acute distress.  HEENT:  Normocephalic, atraumatic.  Pupils equal, round, react to light.  Sclerae are clear.  NECK:  Supple.  No bruits, no JVD.  CARDIOVASCULAR:  A regular rate and rhythm.  S1 and S2.  No murmurs,  rubs or gallops.  Pulses are 2+ and equal without bruits.  LUNGS:  Clear to auscultation.  ABDOMEN:  Soft, nontender, positive bowel sounds.  EXTREMITIES:  Lower extremities without clubbing, cyanosis or edema.  NEUROLOGIC:  The patient is alert and oriented x3.  Cranial nerves II-  XII grossly intact.   Chest x-ray showing COPD without acute findings.  CT of the head showing  no acute findings.  Positive for atrophy and old lacunar infarcts.  EKG:  Sinus rhythm without acute ST or T-wave changes at a rate of 68.  ER lab  work shows a H&H of 14.9 and 44, WBC is elevated 12.1, platelet count  243,000.  Sodium 137, potassium 3.7, chloride 103, CO2 24, BUN 15,  creatinine 0.7, with glucose of 112.  Point of care markers:  Troponin  less than 0.05.   Dr. Randa Evens in to examine and assess patient with episode of  presyncope x2 with history of hypertension, diabetes, hyperlipidemia,  previous CVA, and also with elevated WBC at this time but afebrile.  The  patient will be admitted to a telemetry unit, proceed with 2-D  echocardiogram, carotid Dopplers.  Will ask neurology to consult during  this admission, cancel her previously scheduled tests at our office, and  continue home medications.     Dorian Pod, ACNP      Salvadore Farber, MD  Electronically Signed   MB/MEDQ  D:  04/25/2006  T:  04/25/2006  Job:  [id number]

## 2010-09-09 NOTE — Assessment & Plan Note (Signed)
[facility]                            CARDIOLOGY OFFICE NOTE   NAME:Lough, KADEJA GRANADA                     MRN:          [id number]  DATE:08/08/2006                            DOB:          May 24, 1922    Ms. [name] returns today for further management of her history of  severe orthostatic hypotension and syncope.   Please see my note from May 09, 2006.   She denies any pre-syncope or syncope.  She is a little concerned that  her blood pressure on occasion jumps up to 150, but mostly has been  running normal.   MEDICATIONS:  1. Aspirin 325 half daily.  2. Vytorin 10/20 daily.  3. Zantac 150 mg a day.  4. Altace 2.5 mg a day.  5. Prilosec unknown dose daily.  6. Multivitamin daily.   EXAM:  Blood pressure is 130/78, pulse is 78 and regular, weight is 147.  She is in no acute distress.  HEENT:  Normocephalic, atraumatic.  PERRLA.  Extraocular movements  intact.  Sclerae clear.  Facial symmetry is normal.  Dentition is  satisfactory.  Carotid upstrokes are equal bilaterally without bruits.  There is no  JVD.  Thyroid is not enlarged.  Trachea is midline.  LUNGS:  Clear.  HEART:  Regular rate and rhythm without gallop, rub, or murmur.  ABDOMEN:  Soft.  Good bowel sounds.  EXTREMITIES:  No cyanosis, clubbing, or edema.  Pulses are intact.   EKG:  Not repeated today.   ASSESSMENT AND PLAN:  Ms. Robards is doing well on her medical program.  She seems to be tolerating the lower dose of Altace much better and  certainly does not need a diuretic with her history of orthostatic  hypotension.  Her blood pressure is 150, as isolated, is okay, which I  have reinforced today.   She is scheduled to have knee surgery in [place] in May.  I am  copying her records for her today, and we will send those down to  [facility].   I will plan on seeing her back again in 6 months.     Thomas C. Daleen Squibb, MD, [facility]  Electronically Signed    TCW/MedQ  DD: 08/08/2006  DT: 08/08/2006  Job #: [id number]   cc:   Marlyce Huge, PA

## 2010-09-09 NOTE — Discharge Summary (Signed)
NAMECAYLA, [name]              ACCOUNT NO.:  [id number]   MEDICAL RECORD NO.:  [id number]          PATIENT TYPE:  INP   LOCATION:  3027                         FACILITY:  [facility]   PHYSICIAN:  [name], MD    DATE OF BIRTH:  03-13-1923   DATE OF ADMISSION:  05/21/2004  DATE OF DISCHARGE:  05/24/2004                                 DISCHARGE SUMMARY   DISCHARGE DIAGNOSES:  1.  Small right pontine membrane junction hemorrhage secondary to cavernous      angioma.  2.  Hypertension.  3.  Hyperlipidemia.  4.  Diabetes.  5.  Ischemic heart disease.  6.  History of gout.  7.  Arthritis.  8.  Status post hysterectomy.  9.  Status post rectal polyp removal.  10. Cataract surgery.   DISCHARGE MEDICATIONS:  1.  No aspirin for now.  Resume aspirin in 3 weeks.  2.  Altace 5 mg a day.  3.  Hydrochlorothiazide 12.5 mg a day.  4.  Lipitor 20 mg a day.   STUDIES PERFORMED:  1.  CT of the head on admission shows a small 3 x 6 mm area of linear      hemorrhage within the mid brain and located slightly to the right of the      midline.  Diffuse cerebral and cerebellar atrophy, otherwise negative.  2.  Follow-up CT in 2 days shows small cluster of calcifications in the      right pons consistent with a cavernous angioma, no acute findings, no      increase in hemorrhage.  3.  Chest x-ray shows hyperinflation, no evidence of acute chest disease.  4.  EKG shows sinus rhythm with occasional premature ventricular complexes.      Possible anterior infarct, age undetermined.  5.  Follow-up EKG shows normal sinus rhythm with nonspecific ST wave      abnormality.  6.  2-D echocardiogram shows ejection fraction of 55-65% with no left      ventricular wall abnormalities.  No embolic source.   Dictation ended at this point.    SB/MEDQ  D:  05/24/2004  T:  05/24/2004  Job:  [id number]   cc:   [name], M.D. [facility]

## 2010-09-09 NOTE — Discharge Summary (Signed)
[name], [name]              ACCOUNT NO.:  [id number]   MEDICAL RECORD NO.:  [id number]          PATIENT TYPE:  INP   LOCATION:  4707                         FACILITY:  [facility]   PHYSICIAN:  Thomas C. Wall, MD, FACCDATE OF BIRTH:  02-Dec-1922   DATE OF ADMISSION:  04/25/2006  DATE OF DISCHARGE:  04/27/2006                               DISCHARGE SUMMARY   Cardiologist is Jesse Sans. Wall, MD.  Primary care physician is [facility] A.  Tower, MD.   REASON FOR ADMISSION:  Near-syncope x3.   DISCHARGE DIAGNOSES:  1. Near-syncope most likely consistent with orthostatic hypotension.  2. Good left ventricular function.  3. History of cavernous angioma of the right pons complicated by      hemorrhage May 21, 2004.  4. Hypertension.  5. Hyperlipidemia.  6. Diabetes mellitus type 2.  7. History of gout.  8. Osteoarthritis.  9. History of cataract surgery.  10.History of hysterectomy.  11.History of rectal polyp removal.  12.History of right carpal tunnel release.   CONSULTATIONS THIS ADMISSION:  Neurology.   HISTORY:  [name] is a pleasant 77 year old female patient who was  seen by Dr. Daleen Squibb recently in our [place] office on December 27 with  complaints of near-syncope.  She was referred back to [facility]  for her symptoms.  On the date of admission she had another episode of  near-syncope.  She complained of haloes in her vision and decrease in  visual acuity.  She called EMS and was brought to the emergency room.  Our service saw her for further evaluation.   HOSPITAL COURSE:  The patient was admitted for further evaluation and  treatment.  She was set up for inpatient echocardiogram and carotid  Dopplers as well as being monitored on telemetry.  With her history of  pontine angioma, neurology was also asked to the patient.  Her carotid  Dopplers returned showing bilateral mild plaque, no ICA stenosis.  Her  echocardiogram revealed normal LV function with an EF  of 55-65%.  There  was mild mitral regurgitation.  Dr. Sharene Skeans saw the patient and also  sent her for an MRI with and without contrast to further evaluate her  angioma, as well as an EEG.  The EEG was normal.  The MRI revealed right  pontine occult cerebrovascular malformation without a new abnormality  detected.  There was also atrophy and minimal small vessel disease  changes.  There was minimal partial opacification of the left mastoid  air cells involving mucosal thickening of the ethmoid sinus air cells.  It was felt that her angioma was unchanged.  She had no arrhythmias on  telemetry.  With her orthostatic vital signs it was noted that her blood  pressure does not increase from lying to standing.  Dr. Daleen Squibb Dr. And Dr.  Sharene Skeans both agreed that the patient's symptoms were probably related  to orthostatic hypotension.  Her hydrochlorothiazide was discontinued  and Altace dose was decreased.  On January 4, /2007, it was felt that  she was stable enough for discharge to home.   LABORATORY AND ANCILLARY DATA:  MRI  with and without contrast of the  brain as noted above.  Chest x-ray from admission:  COPD without acute  findings.  Head CT from admission showed stable mild to moderate diffuse  cerebral and cerebellar atrophy.  Stable right pontine calcification  compatible with an occult vascular malformation.  Stable tiny old  bilateral basal ganglia lacunar infarcts or prominent perivascular  spaces.  No acute abnormalities.  2-D echocardiogram as noted above.  On  admission, white count 12,100, hemoglobin 14.9, hematocrit 44, platelet  count of 243,000.  Sodium 140, potassium 3.7, glucose 141, BUN 15,  creatinine 0.83.  LFTs okay, total protein 5.7, albumin 3.2, calcium at  9.2.  Cardiac markers negative x2.  TSH 2.6.   DISCHARGE MEDICATIONS:  1. Altace 2.5 mg daily.  This dose was decreased.  2. Zantac 150 mg b.i.d..  3. Vytorin 10/20 mg q.h.s.  4. Aspirin 162 mg daily.   The  patient has been advised to stop taking her hydrochlorothiazide.   DIET:  Low-fat, low-sodium diet, diabetic diet.   ACTIVITY:  She is to increase her activity slowly.   FOLLOW-UP:  She is to see Dr. Daleen Squibb in the [facility] office on January  31 at 10 a.m.  She will follow up with Dr. Milinda Antis as scheduled.   TOTAL PHYSICIAN AND PA TIME:  Greater than 30 minutes.      Tereso Newcomer, PA-C      Jesse Sans. Daleen Squibb, MD, [facility]  Electronically Signed    SW/MEDQ  D:  04/27/2006  T:  04/27/2006  Job:  [id number]   cc:   Marne A. Tower, MD  [name], MD

## 2010-11-15 ENCOUNTER — Other Ambulatory Visit: Payer: Self-pay | Admitting: Family Medicine

## 2010-11-15 DIAGNOSIS — M109 Gout, unspecified: Secondary | ICD-10-CM

## 2010-11-21 ENCOUNTER — Other Ambulatory Visit (INDEPENDENT_AMBULATORY_CARE_PROVIDER_SITE_OTHER): Payer: Medicare Other

## 2010-11-21 DIAGNOSIS — E119 Type 2 diabetes mellitus without complications: Secondary | ICD-10-CM

## 2010-11-21 DIAGNOSIS — M109 Gout, unspecified: Secondary | ICD-10-CM

## 2010-11-21 LAB — COMPREHENSIVE METABOLIC PANEL
ALT: 15 U/L (ref 0–35)
Alkaline Phosphatase: 38 U/L — ABNORMAL LOW (ref 39–117)
CO2: 26 mEq/L (ref 19–32)
Creatinine, Ser: 1 mg/dL (ref 0.4–1.2)
GFR: 57.89 mL/min — ABNORMAL LOW (ref >60.00)
Total Bilirubin: 0.6 mg/dL (ref 0.3–1.2)

## 2010-11-21 LAB — URIC ACID: Uric Acid, Serum: 5.4 mg/dL (ref 2.4–7.0)

## 2010-11-21 LAB — HEMOGLOBIN A1C: Hgb A1c MFr Bld: 6.9 % — ABNORMAL HIGH (ref 4.6–6.5)

## 2010-11-29 ENCOUNTER — Encounter: Payer: Self-pay | Admitting: Family Medicine

## 2010-12-02 ENCOUNTER — Other Ambulatory Visit: Payer: Self-pay | Admitting: *Deleted

## 2010-12-02 MED ORDER — ALENDRONATE SODIUM 70 MG PO TABS: 70.0000 mg | ORAL_TABLET | ORAL | Status: DC

## 2010-12-09 ENCOUNTER — Encounter: Payer: Self-pay | Admitting: Family Medicine

## 2010-12-09 ENCOUNTER — Ambulatory Visit (INDEPENDENT_AMBULATORY_CARE_PROVIDER_SITE_OTHER): Payer: Medicare Other | Admitting: Family Medicine

## 2010-12-09 DIAGNOSIS — E785 Hyperlipidemia, unspecified: Secondary | ICD-10-CM

## 2010-12-09 DIAGNOSIS — Z1231 Encounter for screening mammogram for malignant neoplasm of breast: Secondary | ICD-10-CM

## 2010-12-09 DIAGNOSIS — M810 Age-related osteoporosis without current pathological fracture: Secondary | ICD-10-CM

## 2010-12-09 DIAGNOSIS — Z0000 Encounter for general adult medical examination without abnormal findings: Secondary | ICD-10-CM

## 2010-12-09 DIAGNOSIS — M109 Gout, unspecified: Secondary | ICD-10-CM

## 2010-12-09 DIAGNOSIS — E119 Type 2 diabetes mellitus without complications: Secondary | ICD-10-CM

## 2010-12-09 DIAGNOSIS — I10 Essential (primary) hypertension: Secondary | ICD-10-CM

## 2010-12-09 NOTE — Assessment & Plan Note (Signed)
Slight increase in A1C. She has had some diet changes over summer. Will get back on track. Recheck in 6 months.

## 2010-12-09 NOTE — Assessment & Plan Note (Signed)
Well controlled. Continue current medication.  

## 2010-12-09 NOTE — Assessment & Plan Note (Signed)
No further episodes, uric acid normal.

## 2010-12-09 NOTE — Progress Notes (Signed)
Subjective:    Patient ID: [name], female    DOB: Jul 31, 1922, 77 y.o.   MRN: [id number]  HPI  I have personally reviewed the Medicare Annual Wellness questionnaire and have noted 1. The patient's medical and social history 2. Their use of alcohol, tobacco or illicit drugs 3. Their current medications and supplements 4. The patient's functional ability including ADL's, fall risks, home safety risks and hearing or visual             impairment. 5. Diet and physical activities 6. Evidence for depression or mood disorders The patients weight, height, BMI and visual acuity have been recorded in the chart I have made referrals, counseling and provided education to the patient based review of the above and I have provided the pt with a written personalized care plan for preventive services.  Diabetes:  On glyburide Slight increase over time.  Using medications without difficulties: Hypoglycemic episodes: None Hyperglycemic episodes:None Feet problems:None Blood Sugars averaging: Not checking at home. eye exam within last year: In last year.  Elevated Cholesterol:  Previously at goal on simvastatin 40 mg daily, fish oil.  Using medications without problems: yes Muscle aches: None Other complaints:  Gout: no further gout flares. Used colchicine as needed. Uric acid in nml range.  Hypertension:  Well controlled on ramipril Using medication without problems or lightheadedness:  Chest pain with exertion: none Edema:None Short of breath:None Average home BPs: not checking BP Other issues:  Osteoporosis: Doing well on fosamax. Started for worsening osteo  In 2011. Last DXA  2011.     Review of Systems  Constitutional: Negative for fever and fatigue.  HENT: Negative for ear pain.   Eyes: Negative for pain.  Respiratory: Negative for cough, chest tightness and shortness of breath.   Cardiovascular: Negative for chest pain, palpitations and leg swelling.    Gastrointestinal: Negative for nausea, diarrhea, constipation, blood in stool and rectal pain.  Genitourinary: Negative for dysuria, vaginal bleeding, vaginal discharge, vaginal pain and pelvic pain.       No breast lesions.  Neurological: Negative for tremors, syncope, weakness and numbness.  Psychiatric/Behavioral: Negative for self-injury, dysphoric mood and agitation. The patient is not nervous/anxious.        Objective:   Physical Exam  Constitutional: Vital signs are normal. She appears well-developed and well-nourished. She is cooperative.  Non-toxic appearance. She does not appear ill. No distress.       Elderly female in NAD  HENT:  Head: Normocephalic.  Right Ear: Hearing, tympanic membrane, external ear and ear canal normal.  Left Ear: Hearing, tympanic membrane, external ear and ear canal normal.  Nose: Nose normal.  Eyes: Conjunctivae, EOM and lids are normal. Pupils are equal, round, and reactive to light. No foreign bodies found.  Neck: Trachea normal and normal range of motion. Neck supple. Carotid bruit is not present. No mass and no thyromegaly present.  Cardiovascular: Normal rate, regular rhythm, S1 normal, S2 normal, normal heart sounds and intact distal pulses.  Exam reveals no gallop.   No murmur heard. Pulmonary/Chest: Effort normal and breath sounds normal. No respiratory distress. She has no wheezes. She has no rhonchi. She has no rales.  Abdominal: Soft. Normal appearance and bowel sounds are normal. She exhibits no distension, no fluid wave, no abdominal bruit and no mass. There is no hepatosplenomegaly. There is no tenderness. There is no rebound, no guarding and no CVA tenderness. No hernia.  Lymphadenopathy:    She has no cervical adenopathy.  She has no axillary adenopathy.  Neurological: She is alert. She has normal strength. No cranial nerve deficit or sensory deficit.  Skin: Skin is warm, dry and intact. No rash noted.  Psychiatric: Her speech is  normal and behavior is normal. Judgment normal. Her mood appears not anxious. Cognition and memory are normal. She does not exhibit a depressed mood.      Diabetic foot exam: Normal inspection No skin breakdown No calluses  Normal DP pulses Normal sensation to light touch and monofilament Nails normal     Assessment & Plan:  Annual Medicare Wellnes: The patient's preventative maintenance and recommended screening tests for an annual wellness exam were reviewed in full today. Brought up to date unless services declined.  Counselled on the importance of diet, exercise, and its role in overall health and mortality. The patient's FH and SH was reviewed, including their home life, tobacco status, and drug and alcohol status.   DXA 2011 started on fosamax.  Colonoscopy in 2009, no repeat needed.  Up to Date with vaccines Wishes to continue mammograms: schedule No pap or vaginal exam needed.

## 2010-12-09 NOTE — Patient Instructions (Addendum)
Work on decreasing sweets and desserts, eat fruit with peel. Avoid bannanas and oranges. Stop fruit juice. Continue current medications.

## 2010-12-09 NOTE — Assessment & Plan Note (Signed)
Sta ble on fosamax. Recheck DXA in 2013.

## 2010-12-12 ENCOUNTER — Other Ambulatory Visit: Payer: Self-pay | Admitting: *Deleted

## 2010-12-12 MED ORDER — OMEPRAZOLE 20 MG PO CPDR: 20.0000 mg | DELAYED_RELEASE_CAPSULE | Freq: Every day | ORAL | Status: DC

## 2010-12-23 ENCOUNTER — Encounter: Payer: Self-pay | Admitting: Family Medicine

## 2011-01-02 ENCOUNTER — Other Ambulatory Visit: Payer: Self-pay

## 2011-01-02 MED ORDER — SIMVASTATIN 40 MG PO TABS: 40.0000 mg | ORAL_TABLET | Freq: Every day | ORAL | Status: DC

## 2011-01-02 NOTE — Telephone Encounter (Signed)
[facility] faxed refill request Simvastatin 40 mg # 30 x 6.

## 2011-01-03 ENCOUNTER — Ambulatory Visit (INDEPENDENT_AMBULATORY_CARE_PROVIDER_SITE_OTHER): Payer: Medicare Other | Admitting: Family Medicine

## 2011-01-03 ENCOUNTER — Encounter: Payer: Self-pay | Admitting: Family Medicine

## 2011-01-03 DIAGNOSIS — J069 Acute upper respiratory infection, unspecified: Secondary | ICD-10-CM | POA: Insufficient documentation

## 2011-01-03 NOTE — Progress Notes (Signed)
Since Saturday, "I felt like I was getting something."  Some sinus pressure.  Husband was ill recently.  A cousin is on hospice and she is wondering about travel for the funeral.  No fevers.  No cough, no sputum.  Some postnasal gtt, ST and fatigue.  No recent known high sugars.  A1c at goal prev.    Meds, vitals, and allergies reviewed.   ROS: See HPI.  Otherwise, noncontributory.  GEN: nad, alert and oriented HEENT: mucous membranes moist, tm w/o erythema, nasal exam w/o erythema but clear discharge noted,  OP with cobblestoning but no exudates NECK: supple w/o LA CV: rrr.   PULM: ctab, no inc wob SKIN: no acute rash

## 2011-01-03 NOTE — Patient Instructions (Signed)
Drink plenty of fluids, take tylenol as needed, and gargle with warm salt water for your throat.  This should gradually improve.  Take care.  Let us know if you have other concerns.    

## 2011-01-04 NOTE — Assessment & Plan Note (Addendum)
Likely viral, nontoxic and should resolve.  Unlikely to need abx.  ddx d/w pt and she understood.  Risk of abx likely higher than risk of supportive tx only.  She agrees. F/u prn.  Try to rest in meantime.  condolences given re: family.

## 2011-01-24 LAB — CBC
HCT: 40.4
Hemoglobin: 13.5
Hemoglobin: 13.9
MCHC: 33.3
MCHC: 33.9
RDW: 13.1
RDW: 13.5

## 2011-01-24 LAB — URINALYSIS, ROUTINE W REFLEX MICROSCOPIC
Bilirubin Urine: NEGATIVE
Nitrite: NEGATIVE
Specific Gravity, Urine: 1.009
Urobilinogen, UA: 0.2

## 2011-01-24 LAB — CK TOTAL AND CKMB (NOT AT ARMC)
CK, MB: 2.1
Total CK: 104

## 2011-01-24 LAB — URINE CULTURE: Colony Count: 6000

## 2011-01-24 LAB — GLUCOSE, CAPILLARY
Glucose-Capillary: 117 — ABNORMAL HIGH
Glucose-Capillary: 160 — ABNORMAL HIGH
Glucose-Capillary: 216 — ABNORMAL HIGH

## 2011-01-24 LAB — LIPID PANEL
Cholesterol: 125
HDL: 39 — ABNORMAL LOW
LDL Cholesterol: 67
Total CHOL/HDL Ratio: 3.2
Triglycerides: 93

## 2011-01-24 LAB — POCT CARDIAC MARKERS
CKMB, poc: 1.2
CKMB, poc: 1.4
Myoglobin, poc: 75.3
Myoglobin, poc: 96.4
Troponin i, poc: <0.05
Troponin i, poc: <0.05

## 2011-01-24 LAB — BASIC METABOLIC PANEL
CO2: 28
Chloride: 107
GFR calc Af Amer: >60
Glucose, Bld: 109 — ABNORMAL HIGH
Potassium: 4.3
Potassium: 4.5
Sodium: 141

## 2011-01-24 LAB — ETHANOL: Alcohol, Ethyl (B): <5

## 2011-01-24 LAB — APTT: aPTT: 24

## 2011-01-24 LAB — URINE MICROSCOPIC-ADD ON

## 2011-01-24 LAB — RAPID URINE DRUG SCREEN, HOSP PERFORMED: Tetrahydrocannabinol: NOT DETECTED

## 2011-01-24 LAB — DIFFERENTIAL
Basophils Absolute: 0
Basophils Relative: 0
Monocytes Absolute: 0.7
Neutro Abs: 8.8 — ABNORMAL HIGH

## 2011-01-24 LAB — TSH: TSH: 2.934

## 2011-02-03 LAB — HM DIABETES EYE EXAM: HM Diabetic Eye Exam: NORMAL

## 2011-04-04 ENCOUNTER — Telehealth: Payer: Self-pay | Admitting: *Deleted

## 2011-04-04 NOTE — Telephone Encounter (Signed)
Will eval in AM

## 2011-04-04 NOTE — Telephone Encounter (Signed)
Pt c/o almost fainting 2 x in past week.  She states she was able to sit down and did not actually pass out. She feels fine now and says she has had a history of this. Has appt scheduled tomorrow to with Dr Patsy Lager.

## 2011-04-05 ENCOUNTER — Encounter: Payer: Self-pay | Admitting: Family Medicine

## 2011-04-05 ENCOUNTER — Ambulatory Visit (INDEPENDENT_AMBULATORY_CARE_PROVIDER_SITE_OTHER): Payer: Medicare Other | Admitting: Family Medicine

## 2011-04-05 VITALS — BP 120/78 | HR 96 | Temp 97.4°F | Ht 65.0 in | Wt 144.1 lb

## 2011-04-05 DIAGNOSIS — R55 Syncope and collapse: Secondary | ICD-10-CM

## 2011-04-05 DIAGNOSIS — R002 Palpitations: Secondary | ICD-10-CM

## 2011-04-05 NOTE — Progress Notes (Signed)
Patient Name: [name] Date of Birth: 1923/03/12 Age: 77 y.o. Medical Record Number: [id number] Gender: female  History of Present Illness:  [name] is a 77 y.o. very pleasant female patient who presents with the following:  Pleasant elderly female with a history of diabetes hypertension, and hyperlipidemia, with also a history of multiple episodes of frank syncope, multiple hospitalizations for syncope, and a long-standing history of intermittent palpitations. She is not orthostatic today in the office, and she describes 2 scenarios where she got extremely lightheaded and started to pass out, but only fill the sensations for a handful seconds. Both of these occurred within the last week, so they were alarming to the patient. She has had an extensive workup in the past. Multiple echocardiograms have been done, MRIs and MRAs of the head and neck as well as carotid ultrasounds have been done. Followup lab work has also been done, and essentially no etiology for these spells have been found.  Every so often, feels like blood getting to head, but it will not get there. Started to take prilosec and fish oil.  Feels like going to faint, then it will last for a few seconds. Standing for a few seconds, and standing at the sink. Lasted only a few seconds. Standing up once at church --- and has not had another one.   Past Medical History, Surgical History, Social History, Family History, and Problem List have been reviewed in EHR and updated if relevant.  Objective Data: All reviewed.  MRA NECK    Findings: Both vertebral arteries are patent to the basilar without stenosis.  Both vertebral arteries are codominant.    The carotid bifurcation is patent bilaterally without significant stenosis.  There is mild atherosclerotic irregularity of the proximal internal carotid artery bilaterally.    IMPRESSION: No significant carotid or vertebral stenosis.     Carotid Duplex Study   Patient:    [name], [name]  MR #:       [id number]  Study Date: 02/15/2009  Gender:     F  Age:        80  Height:  Weight:  BSA:  Pt. Status:  Room:       3001    [name], [name]   SONOGRAPHER  Thereasa Parkin, RVT   ATTENDING    [name], [name]  Reports also to:   --------------------------------------------------------------------  History and indications:  Indications  780.2 Syncope and collapse. History  Diagnostic evaluation. Syncope. Orthostatic hypotension, history of  small right pontine hemorrhage.   --------------------------------------------------------------------  Study information:   Study status: Routine. Procedure: A vascular evaluation was  performed with the patient in the supine position. Image quality was  adequate. The right CCA, right ECA, right ICA, right vertebral, left  CCA, left ECA, left ICA, and left vertebral arteries were examined.  Carotid duplex study.  Carotid Duplex exam including 2D imaging,  color and spectral Doppler were performed on the extracranial  carotid and vertebral arteries using standard established protocols.  Location: Vascular laboratory. Patient status: Inpatient.  Arterial flow:   +---------------+-------+-------+--------------+-------------------+  Location       V sys  V ed   Flow analysis Comment              +---------------+-------+-------+--------------+-------------------+  Right CCA -    77cm/s 14cm/s ---------------------------------  proximal                                                        +---------------+-------+-------+--------------+-------------------+  Right CCA -    92cm/s 20cm/s ---------------------------------  distal                                                          +---------------+-------+-------+--------------+-------------------+  Right ECA      -93cm/s-8cm/s ---------------------------------   +---------------+-------+-------+--------------+-------------------+  Right ICA -    -63cm/s-16cm/s--------------Minimal plaque       proximal                                                        +---------------+-------+-------+--------------+-------------------+  Right ICA -    122cm/s29cm/s ---------------------------------  distal                                                          +---------------+-------+-------+--------------+-------------------+  Right vertebral52cm/s 12cm/s Antegrade flow-------------------  +---------------+-------+-------+--------------+-------------------+  Left CCA -     100cm/s18cm/s ---------------------------------  proximal                                                        +---------------+-------+-------+--------------+-------------------+  Left CCA -     103cm/s13cm/s ---------------------------------  distal                                                          +---------------+-------+-------+--------------+-------------------+  Left ECA       -77cm/s-6cm/s ---------------------------------  +---------------+-------+-------+--------------+-------------------+  Left ICA -     51cm/s 13cm/s --------------Mild heterogeneous   proximal                                   plaque               +---------------+-------+-------+--------------+-------------------+  Left ICA -     -93cm/s-26cm/s---------------------------------  distal                                                          +---------------+-------+-------+--------------+-------------------+  Left vertebral 56cm/s 11cm/s Antegrade flow-------------------  +---------------+-------+-------+--------------+-------------------+   --------------------------------------------------------------------  Summary:  No significant extracranial carotid artery stenosis demonstrated.  Veterbrals are patent  with antegrade flow.  Other specific details can be found in the table(s) above.  Prepared  and Electronically Authenticated by   Waverly Ferrari, MD  [id number]   Transthoracic Echocardiography   Patient:    [name], [name]  MR #:       [id number]  Study Date: 02/15/2009  Gender:  F  Age:        50  Height:     162.6cm  Weight:     67.6kg  BSA:        1.75m^2  Pt. Status:  Room:       3001    PERFORMING   [place], [facility]     [place], [place]   SONOGRAPHER  Teodoro Spray   ATTENDING    [name], [name]  cc:   --------------------------------------------------------------------  Indications:   TIA 435.9.   --------------------------------------------------------------------  Study Conclusions   1. Left ventricle: The cavity size was normal. Wall thickness was     normal. Systolic function was normal. The estimated ejection     fraction was in the range of 55% to 60%. Images were inadequate     for LV wall motion assessment. Doppler parameters are consistent     with abnormal left ventricular relaxation (grade 1 diastolic     dysfunction).  2. Aortic valve: There was no stenosis.  3. Mitral valve: No significant regurgitation.  4. Right ventricle: The cavity size was normal. Systolic function     was normal.  5. Pulmonary arteries: No TR doppler jet was measured so unable to     estimate PA systolic pressure.  6. Inferior vena cava: The vessel was normal in size; the     respirophasic diameter changes were in the normal range (= 50%);     findings are consistent with normal central venous pressure.  Impressions:   - Technically difficult study with limited acoustic windows. EF is    overall normal. Unable to comment on regional wall motion as all    segments are not well-visualized. No major valvular abnormalities.    If there is significant concern for cardiac source of embolism,    suggest TEE.   Review of Systems: As above. No  fever, chills, sweats. No chest pain. No shortness of breath. Occasional palpitations and having had these times. Episodes when she is getting presyncopal, lightheaded, and starting to pass out, she is already standing, and she is not changing or altering her position. Otherwise, the pertinent positives and negatives are listed above and in the HPI, otherwise a full review of systems has been reviewed and is negative unless noted positive.   Physical Examination: Filed Vitals:   04/05/11 1543 04/05/11 1547 04/05/11 1548 04/05/11 1549  BP: 120/72 120/78 120/78 120/78  Pulse: 94 86 81 96  Temp: 97.4 F (36.3 C)     TempSrc: Oral     Height: 5\' 5"  (1.651 m)     Weight: 144 lb 1.9 oz (65.372 kg)     SpO2: 97%       Body mass index is 23.98 kg/(m^2).   GEN: WDWN, NAD, Non-toxic, A & O x 3 HEENT: Atraumatic, Normocephalic. Neck supple. No masses, No LAD. Ears and Nose: No external deformity. CV: RRR, No M/G/R. No JVD. No thrill. No extra heart sounds. PULM: CTA B, no wheezes, crackles, rhonchi. No retractions. No resp. distress. No accessory muscle use. EXTR: No c/c/e NEURO Normal gait.  PSYCH: Normally interactive. Conversant. Not depressed or anxious appearing.  Calm demeanor.    Assessment and Plan: 1. Syncope  Ambulatory referral to Cardiology  2. Fainting spell  CBC with Differential, Basic metabolic panel, Ultrasound doppler carotid, Ambulatory referral to Cardiology  3. Palpitations  CBC with Differential, Basic metabolic panel, Ultrasound doppler carotid, Ambulatory referral to Cardiology    >40 minutes spent in face  to face time with patient, >50% spent in counselling or coordination of care: While the patient was in the office, I extensively reviewed the records. We will check basic laboratories as above. I am also going to recheck her carotid Dopplers.  She has not seen her cardiologist in more than 2 years, and one question I have is whether or not it is appropriate they  would recommend doing a [facility] D[facility] on this patient to see she is having an occasional arrhythmia. I would appreciate his input.  EKG: Normal sinus rhythm. No ST elevation or depression. Normal R-wave progression.

## 2011-04-05 NOTE — Patient Instructions (Signed)
REFERRAL: GO THE THE FRONT ROOM AT THE ENTRANCE OF OUR CLINIC, NEAR CHECK IN. ASK FOR MARION. SHE WILL HELP YOU SET UP YOUR REFERRAL. DATE: TIME:  

## 2011-04-06 LAB — BASIC METABOLIC PANEL
CO2: 25 mEq/L (ref 19–32)
Calcium: 9.9 mg/dL (ref 8.4–10.5)
Chloride: 107 mEq/L (ref 96–112)
Glucose, Bld: 122 mg/dL — ABNORMAL HIGH (ref 70–99)
Sodium: 140 mEq/L (ref 135–145)

## 2011-04-06 LAB — CBC WITH DIFFERENTIAL/PLATELET
Eosinophils Relative: 0.9 % (ref 0.0–5.0)
HCT: 41.5 % (ref 36.0–46.0)
Hemoglobin: 13.8 g/dL (ref 12.0–15.0)
Lymphocytes Relative: 17.1 % (ref 12.0–46.0)
Lymphs Abs: 2.1 10*3/uL (ref 0.7–4.0)
Monocytes Relative: 6.1 % (ref 3.0–12.0)
Platelets: 237 10*3/uL (ref 150.0–400.0)
WBC: 12.4 10*3/uL — ABNORMAL HIGH (ref 4.5–10.5)

## 2011-04-17 ENCOUNTER — Ambulatory Visit: Payer: Medicare Other | Admitting: Family Medicine

## 2011-04-21 ENCOUNTER — Other Ambulatory Visit: Payer: Self-pay | Admitting: *Deleted

## 2011-04-21 MED ORDER — ALENDRONATE SODIUM 70 MG PO TABS: 70.0000 mg | ORAL_TABLET | ORAL | Status: DC

## 2011-04-27 ENCOUNTER — Ambulatory Visit (INDEPENDENT_AMBULATORY_CARE_PROVIDER_SITE_OTHER): Payer: Medicare Other | Admitting: Cardiology

## 2011-04-27 ENCOUNTER — Encounter (INDEPENDENT_AMBULATORY_CARE_PROVIDER_SITE_OTHER): Payer: Medicare Other | Admitting: *Deleted

## 2011-04-27 ENCOUNTER — Encounter: Payer: Self-pay | Admitting: Cardiology

## 2011-04-27 VITALS — BP 112/78 | HR 72 | Ht 65.0 in | Wt 145.0 lb

## 2011-04-27 DIAGNOSIS — R002 Palpitations: Secondary | ICD-10-CM

## 2011-04-27 DIAGNOSIS — R55 Syncope and collapse: Secondary | ICD-10-CM | POA: Diagnosis not present

## 2011-04-27 NOTE — Progress Notes (Signed)
HPI [name] comes in today for the evaluation and management of presyncope. She said tube episodes in December. Both times she was standing up in one place at the kitchen same. One of the time at church she also had been standing.  She has a history of orthostatic hypotension. This was evaluated during admission in January 2008.   She also has a history of orthostatic hypotension.  Denying chest pain, palpitations, neurological symptoms such as visual loss, double vision, slurred speech, extremity numbness or weakness. She feels like things are just not getting up to her head.  Past Medical History  Diagnosis Date  . Hyperlipidemia   . Hypertension   . Diabetes mellitus     Type II  . GERD (gastroesophageal reflux disease)   . Allergy   . Syncope and collapse     1 episode  . Orthostatic hypotension 04/2006    Hospital  . Palpitations   . Acute pharyngitis   . Other abscess of vulva   . Acute sinusitis, unspecified   . Acute upper respiratory infections of unspecified site   . Hemangioma of unspecified site   . Anal and rectal polyp   . Cataract     History of  . Osteopenia   . Osteoarthritis   . Gout   . Cavernous angioma   . Degenerative cervical disc 02/2006    Current Outpatient Prescriptions  Medication Sig Dispense Refill  . alendronate (FOSAMAX) 70 MG tablet Take 1 tablet (70 mg total) by mouth every 7 (seven) days. Take with a full glass of water on an empty stomach.  4 tablet  3  . aspirin 325 MG tablet Take 162 mg by mouth daily.       . fish oil-omega-3 fatty acids 1000 MG capsule Take 1 g by mouth daily.        Marland Kitchen glyBURIDE (DIABETA) 2.5 MG tablet Take 2.5 mg by mouth 2 (two) times daily with a meal. As needed if glucose is over 200       . Magnesium Hydroxide (PHILLIPS MILK OF MAGNESIA PO) Take 1 tablet by mouth daily. 3-4 times per week       . Multiple Vitamin (MULTIVITAMIN) tablet Take 1 tablet by mouth daily.        Marland Kitchen omeprazole (PRILOSEC) 20 MG  capsule Take 1 capsule (20 mg total) by mouth daily.  30 capsule  11  . simvastatin (ZOCOR) 40 MG tablet Take 1 tablet (40 mg total) by mouth at bedtime.  30 tablet  6    Allergies  Allergen Reactions  . Atorvastatin     REACTION: intolerant  . Codeine     REACTION: GI upset  . Rosuvastatin     REACTION: intolerant    Family History  Problem Relation Age of Onset  . Cancer Maternal Aunt     Breast CA  . Heart disease Other     CHF    History   Social History  . Marital Status: Married    Spouse Name: N/A    Number of Children: 3  . Years of Education: N/A   Occupational History  . 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 Smoking status: Never Smoker   . Smokeless tobacco: Not on file  . Alcohol Use: No  . Drug Use: No  . Sexually Active: Not on file   Other Topics Concern  . Not on file   Social History  Narrative   Diet: fruit and veggies, water, lean protein.Activity limited secondary to knee discomfort/arthritis, but going to church exersice group two times a week.Living will: none, but in process of setting up.    ROS ALL NEGATIVE EXCEPT THOSE NOTED IN HPI  PE  General Appearance: well developed, well nourished in no acute distress, frail HEENT: symmetrical face, PERRLA, good dentition  Neck: no JVD, thyromegaly, or adenopathy, trachea midline Chest: symmetric without deformity Cardiac: PMI non-displaced, RRR, normal S1, S2, no gallop or murmur Lung: clear to ausculation and percussion Vascular: all pulses full without bruits  Abdominal: nondistended, nontender, good bowel sounds, no HSM, no bruits Extremities: no cyanosis, clubbing or edema, no sign of DVT, no varicosities  Skin: normal color, no rashes Neuro: alert and oriented x 3, non-focal Pysch: normal affect  EKG Not repeated  Carotid Dopplers showed nonobstructive plaque antegrade flow in both vertebrals. BMET    Component Value Date/Time   NA  140 04/05/2011 1641   K 4.5 04/05/2011 1641   CL 107 04/05/2011 1641   CO2 25 04/05/2011 1641   GLUCOSE 122* 04/05/2011 1641   BUN 22 04/05/2011 1641   CREATININE 1.3* 04/05/2011 1641   CALCIUM 9.9 04/05/2011 1641   GFRNONAA 53.56 10/19/2009 0839   GFRAA  Value: >60        The eGFR has been calculated using the MDRD equation. This calculation has not been validated in all clinical situations. eGFR's persistently <60 mL/min signify possible Chronic Kidney Disease. 02/15/2009 0745    Lipid Panel     Component Value Date/Time   CHOL 140 10/19/2009 0839   TRIG 82.0 10/19/2009 0839   HDL 46.00 10/19/2009 0839   CHOLHDL 3 10/19/2009 0839   VLDL 16.4 10/19/2009 0839   LDLCALC 78 10/19/2009 0839    CBC    Component Value Date/Time   WBC 12.4* 04/05/2011 1641   RBC 4.39 04/05/2011 1641   HGB 13.8 04/05/2011 1641   HCT 41.5 04/05/2011 1641   PLT 237.0 04/05/2011 1641   MCV 94.6 04/05/2011 1641   MCHC 33.2 04/05/2011 1641   RDW 13.9 04/05/2011 1641   LYMPHSABS 2.1 04/05/2011 1641   MONOABS 0.8 04/05/2011 1641   EOSABS 0.1 04/05/2011 1641   BASOSABS 0.1 04/05/2011 1641

## 2011-04-27 NOTE — Patient Instructions (Signed)
Your physician recommends that you schedule a follow-up appointment in: 4-6 weeks.  Stop taking Ramipril  Be careful when going from sitting to standing and from lying to sitting.  Do not stand in one place for too long.  Do not turn too quickly.

## 2011-04-27 NOTE — Assessment & Plan Note (Signed)
Each episode acems to happen when she's been standing in one place such as the same. She has a history of orthostatic hypotension and near syncope. We'll discontinue her ramipril, keep well-hydrated, and  orthostatic precautions.  We'll see her back in 4-6 weeks to followup on her blood pressure and see how she's doing.

## 2011-04-28 ENCOUNTER — Encounter: Payer: Medicare Other | Admitting: *Deleted

## 2011-05-06 ENCOUNTER — Telehealth: Payer: Self-pay | Admitting: Physician Assistant

## 2011-05-06 ENCOUNTER — Telehealth: Payer: Self-pay | Admitting: Adult Health

## 2011-05-06 NOTE — Telephone Encounter (Signed)
Pt called because she was concerned that her BP was high. She has taken a total of 5mg  of ramipril and her BP is 157/86. She was concerned and anxious.  I advised her that if she began having any symptoms from the high BP, she should call 911 and go directly to the ER. She did not feel that was necessary. Advised her that she could take ramipril 2.5 mg tomorrow am and check her BP at least 2 hours later. She can take another 2.5mg  if her SBP is >170. She should call back if she has any concerns/problems. Will leave a message with the office to call her and schedule an office visit.

## 2011-05-06 NOTE — Telephone Encounter (Signed)
[name] called very concerned about her BP readings. She has taken it a couple of times this am and found it to be elevated at 169-170's systolic. She was last seen by Dr. Daleen Squibb on 04/27/2011 where her BP was low normal at 112/78.  She was taken off of ramipril 2.5 mg because she was having pre-syncope and weakness.  I asked her to go to a pharmacy to check her BP to correlate it with her home BP machine. BP was found to be correlating with readings in the 180/85 range. I had asked her to take ramipril if BP was elevated once correlated with pharmacy machine. She did take the ramipril 2.5mg , waited a couple of hours and rechecked her BP, found to be 177/80. She took and additional dose of 2.5 mg (for a total of 5 mg). She has become quite anxious. She does not have a headache, chest pain or near syncope.      I have asked her to calm down and rest. If her BP is still elevated she is to call us back or be seen in the ER. Uncertain if this is anxiety vs true hypertension, with recent office visit showing normal BP on ramipril with symptoms of near syncope.  Joni Reining NP

## 2011-05-08 ENCOUNTER — Telehealth: Payer: Self-pay

## 2011-05-08 NOTE — Telephone Encounter (Signed)
Per note from [place] PA she spoke with the pt over the weekend and wanted the triage nurse to call the pt today about BP follow-up.  The pt restarted her Ramipril 2.5mg  daily due to elevated BP.  The pt's BP yesterday was 128/71.  The pt denies any episodes of pre-syncope since restarting med.  The pt said she has an appointment with Dr Daleen Squibb on 05/25/11 and will keep that appointment.  I instructed the pt to continue to monitor her BP and keep a record. The pt agreed with plan and will contact our office with any other questions or concerns.

## 2011-05-09 NOTE — Telephone Encounter (Signed)
I agree with restarting ramipril. Schedule followup visit for blood pressure in the office with you.

## 2011-05-19 ENCOUNTER — Telehealth: Payer: Self-pay | Admitting: Cardiology

## 2011-05-19 NOTE — Telephone Encounter (Signed)
New problem:  Patient calling C/O sore throat . Took  Blood pressure @ 9:30 167/ 81. Now 175/90. Please advise on blood pressure medication.

## 2011-05-19 NOTE — Telephone Encounter (Signed)
05/19/11--pt calling stating she has sore throat--no fever--and thinks her BP is going up due to illness--advised to take a second ramipril this pm  And recheck BP tomorrow and if still up we have someone on call all the time--pt agrees--nt

## 2011-05-20 ENCOUNTER — Telehealth: Payer: Self-pay | Admitting: Nurse Practitioner

## 2011-05-20 NOTE — Telephone Encounter (Signed)
Pt called this am stating that bp is up again today.  Yesterday bp was up and she called office and was advised to take an additional ramipril 2.5mg .  She did that.  Today, after usual am meds, bp 161/86.  I rec that she increase her daily ramipril dose to 5mg  (2.5mg  x 2 tabs).  She has f/u with Dr. Daleen Squibb later this week @ which time we can check a bmet and provide her with a Rx for a 5mg  tab.  Pt verbalized understanding.

## 2011-05-23 ENCOUNTER — Telehealth: Payer: Self-pay | Admitting: Cardiology

## 2011-05-23 NOTE — Telephone Encounter (Signed)
No labs ordered. Pt notified.

## 2011-05-23 NOTE — Telephone Encounter (Signed)
New msg Pt wants to know if she needs blood work before her appt tomorrow with Dr wall

## 2011-05-25 ENCOUNTER — Encounter: Payer: Self-pay | Admitting: Cardiology

## 2011-05-25 ENCOUNTER — Ambulatory Visit (INDEPENDENT_AMBULATORY_CARE_PROVIDER_SITE_OTHER): Payer: Medicare Other | Admitting: Cardiology

## 2011-05-25 ENCOUNTER — Other Ambulatory Visit: Payer: Medicare Other

## 2011-05-25 DIAGNOSIS — R55 Syncope and collapse: Secondary | ICD-10-CM

## 2011-05-25 DIAGNOSIS — I10 Essential (primary) hypertension: Secondary | ICD-10-CM

## 2011-05-25 MED ORDER — RAMIPRIL 5 MG PO CAPS: 5.0000 mg | ORAL_CAPSULE | Freq: Every day | ORAL | Status: DC

## 2011-05-25 NOTE — Assessment & Plan Note (Signed)
Suboptimal control. See above note under presyncope

## 2011-05-25 NOTE — Assessment & Plan Note (Signed)
This has improved he may very well be related to drop in her blood pressure. However, her pressures are now too high without ramipril.  We will restart her and continue her at 5 mg per day. We'll continue orthostatic precautions as well as staying well hydrated. Her target blood pressure is to be less than 150 over less than 90. I do not think she'll tolerate lower pressures.

## 2011-05-25 NOTE — Progress Notes (Signed)
HPI  [name] comes in today 4 close followup of her history of presyncope. I saw her several weeks ago at which time she had 3 spells of presyncope. They all seem to occur when she is standing still. She has not hurt herself.  I discontinued ramapril. Since then, her blood pressures been as high as the 170s systolic.  She called and we started her back on 2-1/2 mg a day. Her pressures are still running around 150-160.  We then increased  it by phone to 5 mg per day. Her pressures have been running around 120-135/80 and 90.  She has had no further presyncope. We advised her to practice orthostatic precautions and to stay well hydrated. Past Medical History  Diagnosis Date  . Hyperlipidemia   . Hypertension   . Diabetes mellitus     Type II  . GERD (gastroesophageal reflux disease)   . Allergy   . Syncope and collapse     1 episode  . Orthostatic hypotension 04/2006    Hospital  . Palpitations   . Acute pharyngitis   . Other abscess of vulva   . Acute sinusitis, unspecified   . Acute upper respiratory infections of unspecified site   . Hemangioma of unspecified site   . Anal and rectal polyp   . Cataract     History of  . Osteopenia   . Osteoarthritis   . Gout   . Cavernous angioma   . Degenerative cervical disc 02/2006    Current Outpatient Prescriptions  Medication Sig Dispense Refill  . alendronate (FOSAMAX) 70 MG tablet Take 1 tablet (70 mg total) by mouth every 7 (seven) days. Take with a full glass of water on an empty stomach.  4 tablet  3  . amoxicillin (AMOXIL) 500 MG capsule As directed      . aspirin 325 MG tablet Take 162 mg by mouth daily.       . fish oil-omega-3 fatty acids 1000 MG capsule Take 1 g by mouth daily.        Marland Kitchen glyBURIDE (DIABETA) 2.5 MG tablet Take 2.5 mg by mouth 2 (two) times daily with a meal. As needed if glucose is over 200       . Magnesium Hydroxide (PHILLIPS MILK OF MAGNESIA PO) Take 1 tablet by mouth daily. 3-4 times per week         . Multiple Vitamin (MULTIVITAMIN) tablet Take 1 tablet by mouth daily.        Marland Kitchen omeprazole (PRILOSEC) 20 MG capsule Take 1 capsule (20 mg total) by mouth daily.  30 capsule  11  . oxyCODONE (OXY IR/ROXICODONE) 5 MG immediate release tablet As directed      . penicillin v potassium (VEETID) 500 MG tablet As directed      . ramipril (ALTACE) 2.5 MG capsule Take 1 capsule (2.5 mg total) by mouth daily.      . simvastatin (ZOCOR) 40 MG tablet Take 1 tablet (40 mg total) by mouth at bedtime.  30 tablet  6    Allergies  Allergen Reactions  . Atorvastatin     REACTION: intolerant  . Codeine     REACTION: GI upset  . Rosuvastatin     REACTION: intolerant    Family History  Problem Relation Age of Onset  . Cancer Maternal Aunt     Breast CA  . Heart disease Other     CHF    History   Social History  . Marital Status:  Married    Spouse Name: N/A    Number of Children: 3  . Years of Education: N/A   Occupational History  . 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 Smoking status: Never Smoker   . Smokeless tobacco: Not on file  . Alcohol Use: No  . Drug Use: No  . Sexually Active: Not on file   Other Topics Concern  . Not on file   Social History Narrative   Diet: fruit and veggies, water, lean protein.Activity limited secondary to knee discomfort/arthritis, but going to church exersice group two times a week.Living will: none, but in process of setting up.    ROS ALL NEGATIVE EXCEPT THOSE NOTED IN HPI  PE  General Appearance: well developed, well nourished in no acute distress, frail HEENT: symmetrical face, PERRLA, good dentition  Neck: no JVD, thyromegaly, or adenopathy, trachea midline Chest: symmetric without deformity Cardiac: PMI non-displaced, RRR, normal S1, S2, no gallop or murmur Lung: clear to ausculation and percussion Vascular: all pulses full without bruits  Abdominal: nondistended, nontender, good bowel  sounds, no HSM, no bruits Extremities: no cyanosis, clubbing or edema, no sign of DVT, no varicosities  Skin: normal color, no rashes Neuro: alert and oriented x 3, non-focal Pysch: normal affect  EKG  BMET    Component Value Date/Time   NA 140 04/05/2011 1641   K 4.5 04/05/2011 1641   CL 107 04/05/2011 1641   CO2 25 04/05/2011 1641   GLUCOSE 122* 04/05/2011 1641   BUN 22 04/05/2011 1641   CREATININE 1.3* 04/05/2011 1641   CALCIUM 9.9 04/05/2011 1641   GFRNONAA 53.56 10/19/2009 0839   GFRAA  Value: >60        The eGFR has been calculated using the MDRD equation. This calculation has not been validated in all clinical situations. eGFR's persistently <60 mL/min signify possible Chronic Kidney Disease. 02/15/2009 0745    Lipid Panel     Component Value Date/Time   CHOL 140 10/19/2009 0839   TRIG 82.0 10/19/2009 0839   HDL 46.00 10/19/2009 0839   CHOLHDL 3 10/19/2009 0839   VLDL 16.4 10/19/2009 0839   LDLCALC 78 10/19/2009 0839    CBC    Component Value Date/Time   WBC 12.4* 04/05/2011 1641   RBC 4.39 04/05/2011 1641   HGB 13.8 04/05/2011 1641   HCT 41.5 04/05/2011 1641   PLT 237.0 04/05/2011 1641   MCV 94.6 04/05/2011 1641   MCHC 33.2 04/05/2011 1641   RDW 13.9 04/05/2011 1641   LYMPHSABS 2.1 04/05/2011 1641   MONOABS 0.8 04/05/2011 1641   EOSABS 0.1 04/05/2011 1641   BASOSABS 0.1 04/05/2011 1641

## 2011-05-25 NOTE — Patient Instructions (Addendum)
Your physician has recommended you make the following change in your medication:  Increase Ramipril  Your physician wants you to follow-up in: 3 months with Dr. Daleen Squibb. You will receive a reminder letter in the mail two months in advance. If you don't receive a letter, please call our office to schedule the follow-up appointment  Your physician has requested that you regularly monitor and record your blood pressure readings at home. Please use the same machine at the same time of day to check your readings and record them to bring to your follow-up visit.  Your goal blood pressure is 150/90  Or less

## 2011-05-26 ENCOUNTER — Telehealth: Payer: Self-pay | Admitting: Cardiology

## 2011-05-26 NOTE — Telephone Encounter (Signed)
F/U   Patient returning nurses call, she can be reached at hm# 

## 2011-05-26 NOTE — Telephone Encounter (Signed)
Spoke with pt who thinks she may need bloodwork and wanted to know what to do.  Instructed pt that I do not see any orders or mention of lab work in her note from Dr Daleen Squibb.  Pt is aware that we will verify if and what lab he may order and when.

## 2011-05-26 NOTE — Telephone Encounter (Signed)
Per pt call at her last visit she was given a paper at check out that she is to to have some labwork done and pt read paper to me but I dont see lab order and pt wants to have the labs done on 2/5 at [facility] office please call pt to clarify what she needs and when she needs to go for it

## 2011-06-01 ENCOUNTER — Ambulatory Visit: Payer: Medicare Other | Admitting: Family Medicine

## 2011-08-03 ENCOUNTER — Other Ambulatory Visit: Payer: Self-pay | Admitting: *Deleted

## 2011-08-03 MED ORDER — SIMVASTATIN 40 MG PO TABS: 40.0000 mg | ORAL_TABLET | Freq: Every day | ORAL | Status: DC

## 2011-08-24 ENCOUNTER — Other Ambulatory Visit (INDEPENDENT_AMBULATORY_CARE_PROVIDER_SITE_OTHER): Payer: Medicare Other

## 2011-08-24 DIAGNOSIS — E785 Hyperlipidemia, unspecified: Secondary | ICD-10-CM

## 2011-08-24 LAB — COMPREHENSIVE METABOLIC PANEL
BUN: 20 mg/dL (ref 6–23)
CO2: 26 mEq/L (ref 19–32)
Creatinine, Ser: 1 mg/dL (ref 0.4–1.2)
GFR: 55.16 mL/min — ABNORMAL LOW (ref >60.00)
Glucose, Bld: 102 mg/dL — ABNORMAL HIGH (ref 70–99)
Sodium: 142 mEq/L (ref 135–145)
Total Bilirubin: 0.6 mg/dL (ref 0.3–1.2)
Total Protein: 6.9 g/dL (ref 6.0–8.3)

## 2011-08-24 LAB — HEMOGLOBIN A1C: Hgb A1c MFr Bld: 6.4 % (ref 4.6–6.5)

## 2011-08-24 LAB — LIPID PANEL: HDL: 60.5 mg/dL (ref >39.00)

## 2011-08-31 ENCOUNTER — Ambulatory Visit: Payer: Medicare Other | Admitting: Family Medicine

## 2011-08-31 ENCOUNTER — Encounter: Payer: Self-pay | Admitting: Family Medicine

## 2011-08-31 ENCOUNTER — Ambulatory Visit (INDEPENDENT_AMBULATORY_CARE_PROVIDER_SITE_OTHER): Payer: Medicare Other | Admitting: Family Medicine

## 2011-08-31 VITALS — BP 120/70 | HR 83 | Temp 97.9°F | Ht 65.5 in | Wt 143.0 lb

## 2011-08-31 DIAGNOSIS — E119 Type 2 diabetes mellitus without complications: Secondary | ICD-10-CM | POA: Diagnosis not present

## 2011-08-31 DIAGNOSIS — E785 Hyperlipidemia, unspecified: Secondary | ICD-10-CM

## 2011-08-31 DIAGNOSIS — I10 Essential (primary) hypertension: Secondary | ICD-10-CM | POA: Diagnosis not present

## 2011-08-31 NOTE — Assessment & Plan Note (Signed)
Back to better control back on ramipril. Less issue with dizzy spells.

## 2011-08-31 NOTE — Assessment & Plan Note (Signed)
Well controlled. Continue current medication.  

## 2011-08-31 NOTE — Progress Notes (Signed)
Subjective:    Patient ID: [name], female    DOB: [birth year], 78 y.o.   MRN: [id number]  HPI 78 year old female presents for follow up.  She is doing well overall.  Has noted skin lesion on right back. No pain, no redness, no discharge.   Diabetes: Well controlled, off glyburide Using medications without difficulties: none Hypoglycemic episodes:? Hyperglycemic episodes:? Feet problems:None Blood Sugars averaging:no checking eye exam within last year:  Hypertension:   At goal <130/80 on ACEI Using medication without problems or lightheadedness:See below Last seen 03/2011 for pre-syncopal spell.. Carotid doppler negative, ECHO stable. Saw Dr. Daleen Squibb... Felt due to BP med.. Stopped ramipril. BP went up.. So restarted, now at 5 mg daily  Has follow up on BP tommorow. Has only has mild lightheaded spells since back on ramipril, but more mild. Chest pain with exertion:None Edema:None Short of breath: Average home BPs:116/70, only one at 166 Other issues:  Elevated Cholesterol: well controlled at goal LDL <70 on simvastatin 40 mg daily Using medications without problems: None Muscle aches: None Diet compliance: Good Exercise: Other complaints:    Review of Systems  Constitutional: Negative for fever and fatigue.  HENT: Negative for ear pain.   Eyes: Negative for pain.  Respiratory: Negative for chest tightness and shortness of breath.   Cardiovascular: Negative for chest pain, palpitations and leg swelling.  Gastrointestinal: Negative for abdominal pain.  Genitourinary: Positive for urgency. Negative for dysuria.       Wears poise for rare incontinence of urine, does not bother her much and does not wish to try a medication to treat urinary urgency.  no sign of infection.       Objective:   Physical Exam  Constitutional: Vital signs are normal. She appears well-developed and well-nourished. She is cooperative.  Non-toxic appearance. She does not appear ill. No  distress.       Elderly female in NAD  HENT:  Head: Normocephalic.  Right Ear: Hearing, tympanic membrane, external ear and ear canal normal. Tympanic membrane is not erythematous, not retracted and not bulging.  Left Ear: Hearing, tympanic membrane, external ear and ear canal normal. Tympanic membrane is not erythematous, not retracted and not bulging.  Nose: No mucosal edema or rhinorrhea. Right sinus exhibits no maxillary sinus tenderness and no frontal sinus tenderness. Left sinus exhibits no maxillary sinus tenderness and no frontal sinus tenderness.  Mouth/Throat: Uvula is midline, oropharynx is clear and moist and mucous membranes are normal.  Eyes: Conjunctivae, EOM and lids are normal. Pupils are equal, round, and reactive to light. No foreign bodies found.  Neck: Trachea normal and normal range of motion. Neck supple. Carotid bruit is not present. No mass and no thyromegaly present.  Cardiovascular: Normal rate, regular rhythm, S1 normal, S2 normal, normal heart sounds, intact distal pulses and normal pulses.  Exam reveals no gallop and no friction rub.   No murmur heard. Pulmonary/Chest: Effort normal and breath sounds normal. Not tachypneic. No respiratory distress. She has no decreased breath sounds. She has no wheezes. She has no rhonchi. She has no rales.  Abdominal: Soft. Normal appearance and bowel sounds are normal. There is no tenderness.  Neurological: She is alert.  Skin: Skin is warm, dry and intact. No rash noted.  Psychiatric: Her speech is normal and behavior is normal. Judgment and thought content normal. Her mood appears not anxious. Cognition and memory are normal. She does not exhibit a depressed mood.     Diabetic foot exam:  Normal inspection, bunioins No skin breakdown Positive for  calluses  Normal DP pulses Normal sensation to light touch and monofilament Nails thickjened      Assessment & Plan:

## 2011-08-31 NOTE — Assessment & Plan Note (Signed)
At goal off medication.  °

## 2011-08-31 NOTE — Patient Instructions (Signed)
Continue heathy eating and exercise as tolerated. Follow up in 6 months, with labs prior.

## 2011-09-01 ENCOUNTER — Encounter: Payer: Self-pay | Admitting: Cardiology

## 2011-09-01 ENCOUNTER — Ambulatory Visit (INDEPENDENT_AMBULATORY_CARE_PROVIDER_SITE_OTHER): Payer: Medicare Other | Admitting: Cardiology

## 2011-09-01 VITALS — BP 110/62 | HR 92 | Ht 65.5 in | Wt 143.0 lb

## 2011-09-01 DIAGNOSIS — R55 Syncope and collapse: Secondary | ICD-10-CM | POA: Diagnosis not present

## 2011-09-01 DIAGNOSIS — E785 Hyperlipidemia, unspecified: Secondary | ICD-10-CM

## 2011-09-01 DIAGNOSIS — R002 Palpitations: Secondary | ICD-10-CM | POA: Diagnosis not present

## 2011-09-01 DIAGNOSIS — I10 Essential (primary) hypertension: Secondary | ICD-10-CM | POA: Diagnosis not present

## 2011-09-01 NOTE — Assessment & Plan Note (Signed)
This has significantly improved with running her pressure higher. I've made no changes today. Reviewed orthostatic precautions once again and how to avoid a fall. We'll schedule followup in the next year.

## 2011-09-01 NOTE — Progress Notes (Signed)
[name] returns today for evaluation of her hypertension and most recently orthostatic symptoms. Since we saw her last, her blood pressures averaging in the 120s at home. She has had very few symptoms of orthostasis. She has not fallen. Precautions once again given about avoiding a fall.  Medications reviewed and she is compliant. She offers no other complaints today.

## 2011-09-01 NOTE — Patient Instructions (Signed)
Your physician recommends that you continue on your current medications as directed. Please refer to the Current Medication list given to you today.  Your physician wants you to follow-up in: 1 year. You will receive a reminder letter in the mail two months in advance. If you don't receive a letter, please call our office to schedule the follow-up appointment.  

## 2011-09-04 ENCOUNTER — Other Ambulatory Visit: Payer: Self-pay | Admitting: *Deleted

## 2011-09-04 MED ORDER — ALENDRONATE SODIUM 70 MG PO TABS: 70.0000 mg | ORAL_TABLET | ORAL | Status: DC

## 2011-09-04 NOTE — Telephone Encounter (Signed)
Faxed refill request   

## 2011-09-29 ENCOUNTER — Other Ambulatory Visit: Payer: Self-pay | Admitting: *Deleted

## 2011-09-29 MED ORDER — SIMVASTATIN 40 MG PO TABS: 40.0000 mg | ORAL_TABLET | Freq: Every day | ORAL | Status: DC

## 2011-11-20 ENCOUNTER — Telehealth: Payer: Self-pay | Admitting: Family Medicine

## 2011-11-20 ENCOUNTER — Ambulatory Visit (INDEPENDENT_AMBULATORY_CARE_PROVIDER_SITE_OTHER): Payer: Medicare Other | Admitting: Family Medicine

## 2011-11-20 ENCOUNTER — Encounter: Payer: Self-pay | Admitting: Family Medicine

## 2011-11-20 VITALS — BP 130/70 | HR 80 | Temp 98.2°F | Wt 139.0 lb

## 2011-11-20 DIAGNOSIS — J069 Acute upper respiratory infection, unspecified: Secondary | ICD-10-CM

## 2011-11-20 NOTE — Progress Notes (Signed)
Very pleasant 78 yo here for right nasal drainage for past couple of weeks. Felt much better over the weekend. Husband is in the hospital so she doesn't want to get him sick.   No fevers or chills. No sinus pressure. No ear pain. No CP or SOB.  Has a h/o syncope but has not felt dizzy.    ROS: See HPI  Patient Active Problem List  Diagnosis  . HEMANGIOMA  . DIABETES MELLITUS, TYPE II  . HYPERLIPIDEMIA  . HYPERTENSION  . ALLERGIC RHINITIS  . GERD  . RECTAL POLYPS  . OSTEOARTHRITIS  . OSTEOPOROSIS  . PALPITATIONS, OCCASIONAL  . CATARACT, HX OF  . GOUT, UNSPECIFIED  . Syncope, near   Past Medical History  Diagnosis Date  . Hyperlipidemia   . Hypertension   . Diabetes mellitus     Type II  . GERD (gastroesophageal reflux disease)   . Allergy   . Syncope and collapse     1 episode  . Orthostatic hypotension 04/2006    Hospital  . Palpitations   . Acute pharyngitis   . Other abscess of vulva   . Acute sinusitis, unspecified   . Acute upper respiratory infections of unspecified site   . Hemangioma of unspecified site   . Anal and rectal polyp   . Cataract     History of  . Osteopenia   . Osteoarthritis   . Gout   . Cavernous angioma   . Degenerative cervical disc 02/2006   Past Surgical History  Procedure Date  . Abdominal hysterectomy   . Cataract extraction   . Cavernous hemangioma 04/2004    Hospital  . Cavernous angioma   . Aortic dopplers     Mild plaque.  EEG okay  . Total knee arthroplasty     Right   History  Substance Use Topics  . Smoking status: Never Smoker   . Smokeless tobacco: Not on file  . Alcohol Use: No   Family History  Problem Relation Age of Onset  . Cancer Maternal Aunt     Breast CA  . Heart disease Other     CHF   Allergies  Allergen Reactions  . Atorvastatin     REACTION: intolerant  . Codeine     REACTION: GI upset  . Rosuvastatin     REACTION: intolerant   Current Outpatient Prescriptions on File Prior to  Visit  Medication Sig Dispense Refill  . alendronate (FOSAMAX) 70 MG tablet Take 1 tablet (70 mg total) by mouth every 7 (seven) days. Take with a full glass of water on an empty stomach.  4 tablet  3  . aspirin 325 MG tablet Take 162 mg by mouth daily.       . fish oil-omega-3 fatty acids 1000 MG capsule Take 1 g by mouth daily.        . Magnesium Hydroxide (PHILLIPS MILK OF MAGNESIA PO) Take 1 tablet by mouth daily. 3-4 times per week       . Multiple Vitamin (MULTIVITAMIN) tablet Take 1 tablet by mouth daily.        Marland Kitchen omeprazole (PRILOSEC) 20 MG capsule Take 1 capsule (20 mg total) by mouth daily.  30 capsule  11  . ramipril (ALTACE) 5 MG capsule Take 1 capsule (5 mg total) by mouth daily.  30 capsule  11  . simvastatin (ZOCOR) 40 MG tablet Take 1 tablet (40 mg total) by mouth at bedtime.  30 tablet  9  Meds, vitals, and allergies reviewed.   Physical exam: BP 130/70  Pulse 80  Temp 98.2 F (36.8 C)  Wt 139 lb (63.05 kg)  GEN: nad, alert and oriented HEENT: mucous membranes moist, tm w/o erythema, nasal exam w/o erythema but clear discharge noted. NECK: supple w/o LA CV: RRR PULM: ctab, no inc wob SKIN: no acute rash  Assessment and Plan: 1. URI (upper respiratory infection)    Likely viral vs allergic rhinitis. Reassurance provided. See pt instructions for details.

## 2011-11-20 NOTE — Telephone Encounter (Signed)
Caller: Leanny/Patient; PCP: Kerby Nora E.; CB#: [phone]; ; ; Call regarding Sore Throat;   Patient states she was seen in office 11/20/11 for sore throat, nasal congestion, weakness. Onset 11/18/11. States was dx with possible allergy sx. Patient states she developed increased throat soreness and weakness @ approx. 1215 11/20/11. Afebrile. Patient states sx improved with rest. Patient taking fluids well.  Triage per Sore Throat Protocol. No emergent sx identified. All triage questions negative. Care advice given per guidelines. Patient advised increased fluids, saline nasal washes, humidifier.  Patient advised may gargle with warm salt water. Per Epic Documentation,  Patient advised may use OTC Allegra and Ibuprofen, as directed. Patient advised not to be near spouse who is hospitalized until she is without sx. Patient advised may take Ibuprofen 600mg . q 6 hours prn for pain. Advised to take with food.  Call back parameters reviewed. Patient verbalizes understanding.

## 2011-11-20 NOTE — Patient Instructions (Addendum)
Good to see you. I think this is likely a virus or allergies. You can take over the counter allegra and ibuprofen as needed. Please call us if you are not improving over next several days.

## 2011-11-20 NOTE — Telephone Encounter (Signed)
Patient Name: [name] Call Date & Time: 11/19/2011 3:17:23PM Patient Phone: [phone] PCP: Kerby Nora Patient Gender: Female PCP Fax : [phone] Patient DOB: [birth year] Practice Name[facility] Reason for Call: Caller: [name]/Patient; PCP: Excell Seltzer.; CB#: [phone]; Call regarding Nasal congestion; She states her husband is very ill and has been in the hospital. She states yesterday 11/18/11. she became nasally congested. She states her right nostril is "stopped up". She is wondering if she needed a "Z-pack". She wants to be with her husband and does not want to give him anything. Afebrile, no cough, no n/v/d. She is eating and drinking. Treating with drinking fluids and resting. She is requesting appt for evaluation or Rx for antibiotic. Emergent s/sx ruled out per Upper Respiratory Infection Protocol with home care advised. Per patient request. Appt tomorrow 11/20/11 with Dr. Dayton Martes MD at 09:45 for evaluation. Caller expressed understanding. She will call back for questions, changes or concerns. Protocol(s) Used: Upper Respiratory Infection (URI) Recommended Outcome per Protocol: Provide Home/Self Care Reason for Outcome: All other situations Care Advice: ~ Use a cool mist humidifier to moisten air. Be sure to clean according to manufacturer's instructions. ~ Consider use of a saline nasal spray per package directions to help relieve nasal congestion. Call provider for appointment if hoarseness or cough has continued for 2 weeks or more, or if cough up blood, have difficulty swallowing, or have a lump in the neck. ~ ~ See a provider if have sore throat symptoms for 2 weeks or more, or if have swollen lymph nodes. ~ SYMPTOM / CONDITION MANAGEMENT Consider nonprescription decongestant (Sudafed, Drixoral) for relief of symptoms after checking with a provider, especially if there is a history of hypertension, hyperthyroidism, heart disease, diabetes,  glaucoma, urinary retention caused by prostatic hypertrophy. ~ Most adults need to drink 6-10 eight-ounce glasses (1.2-2.0 liters) of fluids per day unless previously told to limit fluid intake for other medical reasons. Limit fluids that contain caffeine, sugar or alcohol. Urine will be a very light yellow color when you drink enough fluids. ~ Analgesic/Antipyretic Advice - Acetaminophen: Consider acetaminophen as directed on label or by pharmacist/provider for pain or fever PRECAUTIONS: - Use if there is no history of liver disease, alcoholism, or intake of three or more alcohol drinks per day - Only if approved by provider during pregnancy or when breastfeeding - During pregnancy, acetaminophen should not be taken more than 3 consecutive days without telling provider - Do not exceed recommended dose or frequency ~ Analgesic/Antipyretic Advice - NSAIDs: Consider aspirin, ibuprofen, naproxen or ketoprofen for pain or fever as directed on label or by pharmacist/provider. PRECAUTIONS: - If over 41 years of age, should not take longer than 1 week without consulting provider. EXCEPTIONS: - Should not be used if taking blood thinners or have bleeding problems. - Do not use if have history of sensitivity/allergy to any of these medications; or history of cardiovascular, ulcer, kidney, liver disease or diabetes unless approved by provider. - Do not exceed recommended dose or frequency. ~ 07/

## 2011-11-21 NOTE — Telephone Encounter (Signed)
Noted  

## 2011-11-29 ENCOUNTER — Ambulatory Visit: Payer: Medicare Other | Admitting: Family Medicine

## 2012-01-16 DIAGNOSIS — Z23 Encounter for immunization: Secondary | ICD-10-CM | POA: Diagnosis not present

## 2012-02-16 ENCOUNTER — Telehealth: Payer: Self-pay | Admitting: Family Medicine

## 2012-02-16 NOTE — Telephone Encounter (Signed)
Agreed with recommendations

## 2012-02-16 NOTE — Telephone Encounter (Signed)
Caller: Jill/Patient; Patient Name: [name]; PCP: Kerby Nora [facility]); Best Callback Phone Number: [phone]    onset around 02-13-12 of head congestion, cough and had sore throat first day but not now.  No fever.  cough has been non productive.She feels she may have drainage down back of her throat the is making her cough.   No shortness of breath.  All emergent sxs per Cough protocols ruled out.  Home care advice given  She said she has some Allegra we had given her before and wants to know if she should take it.  I advised her to go ahead and take it.

## 2012-02-19 ENCOUNTER — Encounter: Payer: Self-pay | Admitting: Family Medicine

## 2012-02-19 ENCOUNTER — Ambulatory Visit (INDEPENDENT_AMBULATORY_CARE_PROVIDER_SITE_OTHER): Payer: Medicare Other | Admitting: Family Medicine

## 2012-02-19 VITALS — BP 118/70 | HR 88 | Temp 98.2°F | Wt 143.5 lb

## 2012-02-19 DIAGNOSIS — J069 Acute upper respiratory infection, unspecified: Secondary | ICD-10-CM | POA: Diagnosis not present

## 2012-02-19 NOTE — Progress Notes (Signed)
Nature conservation officer at [facility] [address] [place] [address] Phone: [phone] Fax: [phone]  Date:  02/19/2012   Name:  [name]   DOB:  Jul 31, 1922   MRN:  [id number] Gender: female Age: 78 y.o.  PCP:  Kerby Nora, MD  Evaluating MD: Hannah Beat, MD   Chief Complaint: URI   History of Present Illness:  [name] is a 78 y.o. pleasant patient who presents with the following:  Has been sick, and started to get a sore throat last Tuesday, and wed or Thursday. And over the weekend, and had to cough until the ribs hurt. Did not think was running a temperature. Had a 100.7 temperature. None over past couple of days. Throat a little sore, ears muffled, but feeling better over the past couple of days.  Patient Active Problem List  Diagnosis  . HEMANGIOMA  . DIABETES MELLITUS, TYPE II  . HYPERLIPIDEMIA  . HYPERTENSION  . ALLERGIC RHINITIS  . GERD  . RECTAL POLYPS  . OSTEOARTHRITIS  . OSTEOPOROSIS  . PALPITATIONS, OCCASIONAL  . CATARACT, HX OF  . GOUT, UNSPECIFIED  . Syncope, near    Past Medical History  Diagnosis Date  . Hyperlipidemia   . Hypertension   . Diabetes mellitus     Type II  . GERD (gastroesophageal reflux disease)   . Allergy   . Syncope and collapse     1 episode  . Orthostatic hypotension 04/2006    Hospital  . Palpitations   . Acute pharyngitis   . Other abscess of vulva   . Acute sinusitis, unspecified   . Acute upper respiratory infections of unspecified site   . Hemangioma of unspecified site   . Anal and rectal polyp   . Cataract     History of  . Osteopenia   . Osteoarthritis   . Gout   . Cavernous angioma   . Degenerative cervical disc 02/2006    Past Surgical History  Procedure Date  . Abdominal hysterectomy   . Cataract extraction   . Cavernous hemangioma 04/2004    Hospital  . Cavernous angioma   . Aortic dopplers     Mild plaque.  EEG okay  . Total knee arthroplasty     Right     History  Substance Use Topics  . Smoking status: Former Games developer  . Smokeless tobacco: Never Used   Comment: quit 1970  . Alcohol Use: Yes     wine occassionally    Family History  Problem Relation Age of Onset  . Cancer Maternal Aunt     Breast CA  . Heart disease Other     CHF    Allergies  Allergen Reactions  . Atorvastatin     REACTION: intolerant  . Codeine     REACTION: GI upset  . Rosuvastatin     REACTION: intolerant    Medication list has been reviewed and updated.  Outpatient Prescriptions Prior to Visit  Medication Sig Dispense Refill  . alendronate (FOSAMAX) 70 MG tablet Take 1 tablet (70 mg total) by mouth every 7 (seven) days. Take with a full glass of water on an empty stomach.  4 tablet  3  . aspirin 325 MG tablet Take 162 mg by mouth daily.       . fish oil-omega-3 fatty acids 1000 MG capsule Take 1 g by mouth daily.        . Magnesium Hydroxide (PHILLIPS MILK OF MAGNESIA PO) Take 1  tablet by mouth daily. 3-4 times per week       . Multiple Vitamin (MULTIVITAMIN) tablet Take 1 tablet by mouth daily.        . ramipril (ALTACE) 5 MG capsule Take 1 capsule (5 mg total) by mouth daily.  30 capsule  11  . simvastatin (ZOCOR) 40 MG tablet Take 1 tablet (40 mg total) by mouth at bedtime.  30 tablet  9  . omeprazole (PRILOSEC) 20 MG capsule Take 1 capsule (20 mg total) by mouth daily.  30 capsule  11    Review of Systems:  ROS: GEN: Acute illness details above GI: Tolerating PO intake GU: maintaining adequate hydration and urination Pulm: No SOB Interactive and getting along well at home.  Otherwise, ROS is as per the HPI.   Physical Examination: Filed Vitals:   02/19/12 1002  BP: 118/70  Pulse: 88  Temp: 98.2 F (36.8 C)  TempSrc: Oral  Weight: 143 lb 8 oz (65.091 kg)  SpO2: 97%    There is no height on file to calculate BMI. Ideal Body Weight:     Gen: WDWN, NAD; A & O x3, cooperative. Pleasant.Globally Non-toxic HEENT: Normocephalic  and atraumatic. Throat clear, w/o exudate, R TM clear, L TM - good landmarks, No fluid present. rhinnorhea.  MMM Frontal sinuses: NT Max sinuses: NT NECK: Anterior cervical  LAD is absent CV: RRR, No M/G/R, cap refill <2 sec PULM: Breathing comfortably in no respiratory distress. no wheezing, crackles, rhonchi EXT: No c/c/e PSYCH: Friendly, good eye contact MSK: Nml gait    Assessment and Plan:  URI I discussed upper respiratory tract infections with the patient and explained viral infections in general.  Recommended plenty of sleep. Symptomatic care with pushing fluids. Oral acetaminophen or NSAIDs as tolerated for body aches, chills, fevers.  follow-up if acutely worsens   Hannah Beat, MD

## 2012-02-26 ENCOUNTER — Telehealth: Payer: Self-pay | Admitting: Family Medicine

## 2012-02-26 DIAGNOSIS — E119 Type 2 diabetes mellitus without complications: Secondary | ICD-10-CM

## 2012-02-26 DIAGNOSIS — M109 Gout, unspecified: Secondary | ICD-10-CM

## 2012-02-26 DIAGNOSIS — M810 Age-related osteoporosis without current pathological fracture: Secondary | ICD-10-CM

## 2012-02-26 NOTE — Telephone Encounter (Signed)
Message copied by Excell Seltzer on Mon Feb 26, 2012  3:32 AM ------      Message from: Liane Comber C      Created: Thu Feb 22, 2012  7:11 AM      Regarding: F/u labs Tues 11/5       Please order  future f/u labs for pt's upcomming lab appt.      Thanks      Rodney Booze

## 2012-02-27 ENCOUNTER — Other Ambulatory Visit (INDEPENDENT_AMBULATORY_CARE_PROVIDER_SITE_OTHER): Payer: Medicare Other

## 2012-02-27 DIAGNOSIS — E119 Type 2 diabetes mellitus without complications: Secondary | ICD-10-CM

## 2012-02-27 DIAGNOSIS — M109 Gout, unspecified: Secondary | ICD-10-CM

## 2012-02-27 DIAGNOSIS — M810 Age-related osteoporosis without current pathological fracture: Secondary | ICD-10-CM

## 2012-02-27 LAB — COMPREHENSIVE METABOLIC PANEL
ALT: 17 U/L (ref 0–35)
AST: 20 U/L (ref 0–37)
BUN: 25 mg/dL — ABNORMAL HIGH (ref 6–23)
Creatinine, Ser: 1 mg/dL (ref 0.4–1.2)
GFR: 55.73 mL/min — ABNORMAL LOW (ref >60.00)
Total Bilirubin: 0.5 mg/dL (ref 0.3–1.2)

## 2012-02-27 LAB — LIPID PANEL
HDL: 52.6 mg/dL (ref >39.00)
Triglycerides: 66 mg/dL (ref 0.0–149.0)
VLDL: 13.2 mg/dL (ref 0.0–40.0)

## 2012-02-28 LAB — VITAMIN D 25 HYDROXY (VIT D DEFICIENCY, FRACTURES): Vit D, 25-Hydroxy: 40 ng/mL (ref 30–89)

## 2012-03-05 ENCOUNTER — Encounter: Payer: Self-pay | Admitting: Family Medicine

## 2012-03-05 ENCOUNTER — Ambulatory Visit (INDEPENDENT_AMBULATORY_CARE_PROVIDER_SITE_OTHER): Payer: Medicare Other | Admitting: Family Medicine

## 2012-03-05 VITALS — BP 130/72 | HR 77 | Temp 97.5°F | Ht 65.5 in | Wt 145.5 lb

## 2012-03-05 DIAGNOSIS — I10 Essential (primary) hypertension: Secondary | ICD-10-CM | POA: Diagnosis not present

## 2012-03-05 DIAGNOSIS — M109 Gout, unspecified: Secondary | ICD-10-CM

## 2012-03-05 DIAGNOSIS — E785 Hyperlipidemia, unspecified: Secondary | ICD-10-CM | POA: Diagnosis not present

## 2012-03-05 DIAGNOSIS — E119 Type 2 diabetes mellitus without complications: Secondary | ICD-10-CM | POA: Diagnosis not present

## 2012-03-05 NOTE — Assessment & Plan Note (Signed)
Well controlled with diet. 

## 2012-03-05 NOTE — Assessment & Plan Note (Signed)
Well controlled. Continue current medication. Encouraged exercise, weight loss, healthy eating habits.  

## 2012-03-05 NOTE — Assessment & Plan Note (Signed)
Uric acid at goal

## 2012-03-05 NOTE — Progress Notes (Signed)
Subjective:    Patient ID: [name], female    DOB: 11-19-1922, 78 y.o.   MRN: [id number]  HPI 78 year old female presents for 6 month follow up.. Husband passed away in 11-Jan-2012, daughter with possible cancer recurrence.  Diabetes: Well controlled, off glyburide  Lab Results  Component Value Date   HGBA1C 6.6* 02/27/2012  Using medications without difficulties: none  Hypoglycemic episodes:?  Hyperglycemic episodes:?  Feet problems:None  Blood Sugars averaging:not checking  eye exam within last year:   Hypertension: At goal <130/80 on ACEI  Using medication without problems or lightheadedness:She has occ lightheadedness, but no presyncopal spells recently " not like they are' Chest pain with exertion:None  Edema:None  Short of breath: None Average home BPs:not checking Other issues:   Elevated Cholesterol: Well controlled at goal LDL <70 on simvastatin 40 mg daily  Lab Results  Component Value Date   CHOL 155 02/27/2012   HDL 52.60 02/27/2012   LDLCALC 89 02/27/2012   TRIG 66.0 02/27/2012   CHOLHDL 3 02/27/2012  Using medications without problems: None  Muscle aches: None  Diet compliance: Good  Exercise:  Other complaints:    Hx of syncopal eveny   Review of Systems  Constitutional: Negative for fever and fatigue.  HENT: Negative for ear pain.   Eyes: Negative for pain.  Respiratory: Negative for chest tightness and shortness of breath.   Cardiovascular: Negative for chest pain, palpitations and leg swelling.  Gastrointestinal: Negative for abdominal pain.  Genitourinary: Positive for urgency and frequency. Negative for dysuria, flank pain, difficulty urinating and pelvic pain.       Nocturia       Objective:   Physical Exam  Constitutional: Vital signs are normal. She appears well-developed and well-nourished. She is cooperative.  Non-toxic appearance. She does not appear ill. No distress.       Elderly female in NAD  HENT:  Head: Normocephalic.  Right  Ear: Hearing, tympanic membrane, external ear and ear canal normal. Tympanic membrane is not erythematous, not retracted and not bulging.  Left Ear: Hearing, tympanic membrane, external ear and ear canal normal. Tympanic membrane is not erythematous, not retracted and not bulging.  Nose: No mucosal edema or rhinorrhea. Right sinus exhibits no maxillary sinus tenderness and no frontal sinus tenderness. Left sinus exhibits no maxillary sinus tenderness and no frontal sinus tenderness.  Mouth/Throat: Uvula is midline, oropharynx is clear and moist and mucous membranes are normal.  Eyes: Conjunctivae normal, EOM and lids are normal. Pupils are equal, round, and reactive to light. No foreign bodies found.  Neck: Trachea normal and normal range of motion. Neck supple. Carotid bruit is not present. No mass and no thyromegaly present.  Cardiovascular: Normal rate, regular rhythm, S1 normal, S2 normal, normal heart sounds, intact distal pulses and normal pulses.  Exam reveals no gallop and no friction rub.   No murmur heard. Pulmonary/Chest: Effort normal and breath sounds normal. Not tachypneic. No respiratory distress. She has no decreased breath sounds. She has no wheezes. She has no rhonchi. She has no rales.  Abdominal: Soft. Normal appearance and bowel sounds are normal. There is no tenderness.  Neurological: She is alert.  Skin: Skin is warm, dry and intact. No rash noted.  Psychiatric: Her speech is normal and behavior is normal. Judgment and thought content normal. Her mood appears not anxious. Cognition and memory are normal. She does not exhibit a depressed mood.          Assessment &  Plan:

## 2012-03-05 NOTE — Progress Notes (Signed)
  Subjective:    Patient ID: [name], female    DOB: 27-Sep-1922, 78 y.o.   MRN: [id number]  HPI    Review of Systems     Objective:   Physical Exam  Diabetic foot exam: Normal inspection No skin breakdown No calluses  Normal DP pulses Normal sensation to light touch and monofilament Nails thickened.        Assessment & Plan:

## 2012-03-05 NOTE — Patient Instructions (Addendum)
Follow up in 6 months for medicare wellness and fasting labs prior.  When you are able get out to go walking, work on heart healthy diet.  let me know if urge incontinence gets worse.  Stop by front desk for  Referral to podiatry for nail maintanance. Can put hydrocortisone cream on irritate skin lesion under left breast.

## 2012-03-05 NOTE — Assessment & Plan Note (Signed)
Well controlled. Continue current medication.  

## 2012-03-11 DIAGNOSIS — B351 Tinea unguium: Secondary | ICD-10-CM | POA: Diagnosis not present

## 2012-03-11 DIAGNOSIS — M79609 Pain in unspecified limb: Secondary | ICD-10-CM | POA: Diagnosis not present

## 2012-03-26 DIAGNOSIS — H43399 Other vitreous opacities, unspecified eye: Secondary | ICD-10-CM | POA: Diagnosis not present

## 2012-03-26 DIAGNOSIS — H1045 Other chronic allergic conjunctivitis: Secondary | ICD-10-CM | POA: Diagnosis not present

## 2012-03-26 DIAGNOSIS — Z961 Presence of intraocular lens: Secondary | ICD-10-CM | POA: Diagnosis not present

## 2012-03-26 DIAGNOSIS — H35369 Drusen (degenerative) of macula, unspecified eye: Secondary | ICD-10-CM | POA: Diagnosis not present

## 2012-03-26 DIAGNOSIS — H04129 Dry eye syndrome of unspecified lacrimal gland: Secondary | ICD-10-CM | POA: Diagnosis not present

## 2012-03-26 LAB — HM DIABETES EYE EXAM: HM Diabetic Eye Exam: NORMAL

## 2012-04-04 ENCOUNTER — Telehealth: Payer: Self-pay | Admitting: Family Medicine

## 2012-04-04 NOTE — Telephone Encounter (Signed)
Patient Information:  Caller Name: Abriella  Phone: [phone]  Patient: [name]  Gender: Female  DOB: 13-Apr-1923  Age: 78 Years  PCP: Kerby Nora (Family Practice)  Office Follow Up:  Does the office need to follow up with this patient?: No  Instructions For The Office: N/A  RN Note:  Patient has nasal drainage that is mostly clear. She has noticed yellow a few times and small amount of blood a few times.  Symptoms  Reason For Call & Symptoms: clear nasal drainage, sore throat earlier but that is gone  Reviewed Health History In EMR: Yes  Reviewed Medications In EMR: Yes  Reviewed Allergies In EMR: Yes  Reviewed Surgeries / Procedures: Yes  Date of Onset of Symptoms: 04/01/2012  Treatments Tried: nasal rinses, salt water gargles  Treatments Tried Worked: No  Guideline(s) Used:  Colds  Disposition Per Guideline:   Home Care  Reason For Disposition Reached:   Colds with no complications  Advice Given:  For a Runny Nose With Profuse Discharge:   If the skin around your nostrils gets irritated, apply a tiny amount of petroleum ointment to the nasal openings once or twice a day.  Humidifier:  If the air in your home is dry, use a cool-mist humidifier  Call Back If:  Difficulty breathing occurs  Fever lasts more than 3 days  Nasal discharge lasts more than 10 days  You become worse

## 2012-04-05 NOTE — Telephone Encounter (Signed)
Noted  

## 2012-04-19 ENCOUNTER — Encounter: Payer: Self-pay | Admitting: Family Medicine

## 2012-05-08 ENCOUNTER — Other Ambulatory Visit: Payer: Self-pay | Admitting: *Deleted

## 2012-05-08 MED ORDER — ALENDRONATE SODIUM 70 MG PO TABS: 70.0000 mg | ORAL_TABLET | ORAL | Status: DC

## 2012-05-20 ENCOUNTER — Ambulatory Visit (INDEPENDENT_AMBULATORY_CARE_PROVIDER_SITE_OTHER): Payer: Medicare Other | Admitting: Family Medicine

## 2012-05-20 ENCOUNTER — Encounter: Payer: Self-pay | Admitting: Family Medicine

## 2012-05-20 VITALS — BP 124/72 | HR 96 | Temp 98.0°F | Wt 146.0 lb

## 2012-05-20 DIAGNOSIS — J208 Acute bronchitis due to other specified organisms: Secondary | ICD-10-CM

## 2012-05-20 DIAGNOSIS — J209 Acute bronchitis, unspecified: Secondary | ICD-10-CM

## 2012-05-20 MED ORDER — BENZONATATE 100 MG PO CAPS: 100.0000 mg | ORAL_CAPSULE | Freq: Two times a day (BID) | ORAL | Status: DC | PRN

## 2012-05-20 MED ORDER — AZITHROMYCIN 250 MG PO TABS: ORAL_TABLET | ORAL | Status: DC

## 2012-05-20 NOTE — Progress Notes (Signed)
  Subjective:    Patient ID: [name], female    DOB: 11-13-1922, 79 y.o.   MRN: [id number]  HPI CC: cold sxs  6d h/o cold sxs - started with cold sxs of congestion, now progressing to cough even unto fits.  Cough seems to have improved today.  + productive mucous when coughing.  + PNdrainage and sinus drainage.  Notes some left neck swelling.  Currently with throat and chest congestion >head.  So far has tried benadryl which didn't help at all.  No fevers/chills, abd pain, nausea/vomiting, diarrhea, ear or tooth pain, HA, ST.  No sick contacts at home, no smokers at home. No h/o asthma/COPD. + h/o allergies.  Daughter is undergoing chemo currently, wants to come visit patient.  Past Medical History  Diagnosis Date  . Hyperlipidemia   . Hypertension   . Diabetes mellitus     Type II  . GERD (gastroesophageal reflux disease)   . Allergy   . Syncope and collapse     1 episode  . Orthostatic hypotension 04/2006    Hospital  . Palpitations   . Acute pharyngitis   . Other abscess of vulva   . Acute sinusitis, unspecified   . Acute upper respiratory infections of unspecified site   . Hemangioma of unspecified site   . Anal and rectal polyp   . Cataract     History of  . Osteopenia   . Osteoarthritis   . Gout   . Cavernous angioma   . Degenerative cervical disc 02/2006     Review of Systems Per HPI    Objective:   Physical Exam  Nursing note and vitals reviewed. Constitutional: She appears well-developed and well-nourished. No distress.  HENT:  Head: Normocephalic and atraumatic.  Right Ear: Hearing, tympanic membrane, external ear and ear canal normal.  Left Ear: Hearing, tympanic membrane, external ear and ear canal normal.  Nose: Mucosal edema (L>R nasal turbinate swelling/congestion) present. No rhinorrhea. Right sinus exhibits no maxillary sinus tenderness and no frontal sinus tenderness. Left sinus exhibits no maxillary sinus tenderness and no frontal  sinus tenderness.  Mouth/Throat: Uvula is midline and mucous membranes are normal. Posterior oropharyngeal erythema present. No oropharyngeal exudate, posterior oropharyngeal edema or tonsillar abscesses.  Eyes: Conjunctivae normal and EOM are normal. Pupils are equal, round, and reactive to light. No scleral icterus.       R eye with mild matted discharge Conjunctiva clear.  Neck: Normal range of motion. Neck supple.  Cardiovascular: Normal rate, regular rhythm, normal heart sounds and intact distal pulses.   No murmur heard. Pulmonary/Chest: Effort normal and breath sounds normal. No respiratory distress. She has no wheezes. She has no rales.       clear  Lymphadenopathy:    She has cervical adenopathy (R submandibular LAD).  Skin: Skin is warm and dry. No rash noted.       Assessment & Plan:

## 2012-05-20 NOTE — Assessment & Plan Note (Signed)
Supportive care as per instructions. Anticipate viral as seems to be improving on it's own. Red flags to return discussed. WASP script provided today for zpack to fill if not improving. Discussed concern with contagious nature of illness. Update [place] if not improving as expected.

## 2012-05-20 NOTE — Patient Instructions (Signed)
I do think you have viral bronchitis. May use tessalon perls (swallow, don't chew) as needed for cough. Lots of fluid and rest. May try some simple mucinex or immediate release guaifenesin with plenty of fluid to mobilize mucous out. Watch for fever >101, worsening productive cough, or not improving as expected. If not better or any worsening, fill antibiotic script provided today. Good to see you today, call us with questions.

## 2012-05-21 ENCOUNTER — Telehealth: Payer: Self-pay | Admitting: *Deleted

## 2012-05-21 MED ORDER — ERYTHROMYCIN 5 MG/GM OP OINT: TOPICAL_OINTMENT | Freq: Three times a day (TID) | OPHTHALMIC | Status: DC

## 2012-05-21 NOTE — Telephone Encounter (Signed)
Yes plz notify - she likely does have conjunctivitis. She did have significant drainage out of R eye yesterday.  Will send in antibiotic eye ointment for her to use in right eye.  plz notify sent in.

## 2012-05-21 NOTE — Telephone Encounter (Signed)
Pt called regarding f/u on previous call she made this AM.  Informed her that abx ointment was called in to [facility], [address]

## 2012-05-21 NOTE — Telephone Encounter (Signed)
Pt called stating she was seen yesterday for cough and congestion. Cough is better taking cough med prescribed, but she is having increased R eye yellow drainage and matting. Right eye is pink and she thinks she may have pink eye. Pt says you looked at her eyes yesterday and she wants to know if you think she could possibly have pink eye. Pt denies fever or any pain.

## 2012-05-27 ENCOUNTER — Telehealth: Payer: Self-pay | Admitting: Family Medicine

## 2012-05-27 NOTE — Telephone Encounter (Signed)
Patient advised that medication stays in your system for 10 days and she should give it more time.

## 2012-05-27 NOTE — Telephone Encounter (Signed)
Pt needs to know if she should come back in and be seen because she is still sick and coughing.  She filled the abx she was given and has just taken the last pill.  Please call patient.  [phone]

## 2012-05-31 NOTE — Telephone Encounter (Signed)
Pt called back and wants to know what can take for drainage at back of throat.[facility] [address]

## 2012-05-31 NOTE — Telephone Encounter (Signed)
Pt said S/T comes and goes and Tessalon helps the cough and no fever but pt is supposed to see pt's daughter who is presently taking chemotherapy for cancer and pt wants to know if she could make her daughter sick since pt has finished the antibiotic.Please advise.

## 2012-05-31 NOTE — Telephone Encounter (Signed)
She can use mucinex for drainage and congestion. She is likely not contagious at this point.

## 2012-06-03 NOTE — Telephone Encounter (Signed)
Patient advised and she says she would feel better just coming in she has appt tomorrow at 10:30

## 2012-06-04 ENCOUNTER — Ambulatory Visit (INDEPENDENT_AMBULATORY_CARE_PROVIDER_SITE_OTHER): Payer: Medicare Other | Admitting: Family Medicine

## 2012-06-04 ENCOUNTER — Encounter: Payer: Self-pay | Admitting: Family Medicine

## 2012-06-04 VITALS — BP 130/70 | HR 73 | Temp 98.2°F | Ht 65.5 in | Wt 148.8 lb

## 2012-06-04 DIAGNOSIS — J209 Acute bronchitis, unspecified: Secondary | ICD-10-CM | POA: Diagnosis not present

## 2012-06-04 MED ORDER — DOXYCYCLINE HYCLATE 100 MG PO TABS: 100.0000 mg | ORAL_TABLET | Freq: Two times a day (BID) | ORAL | Status: DC

## 2012-06-04 NOTE — Patient Instructions (Addendum)
Complete antibitoics. Call if  not improving as expected.

## 2012-06-04 NOTE — Progress Notes (Signed)
  Subjective:    Patient ID: [name], female    DOB: 19-Nov-1922, 79 y.o.   MRN: [id number]  HPI  79 year old female with  DM well controlled, HTN presents for re-eval of cough. She was seen on 1/27 and diagnosed with viral bronchitis but was given a prescription to use of a Z-pack if she was not improving. Today she that she did not get better so she filled the Z-pack.. Completed it last week.  She has symptoms now 3 weeks. She reports she is still coughing, productive. Has improved significantly. She has congestion in AMs.  No fever.   No SOB, no wheezing.  No headhache or sinus pain. Sore throat.   Review of Systems  Constitutional: Negative for fever and fatigue.  HENT: Negative for ear pain.   Eyes: Negative for pain.  Respiratory: Negative for chest tightness and shortness of breath.   Cardiovascular: Negative for chest pain, palpitations and leg swelling.  Gastrointestinal: Negative for abdominal pain.  Genitourinary: Negative for dysuria.       Objective:   Physical Exam  Constitutional: Vital signs are normal. She appears well-developed and well-nourished. She is cooperative.  Non-toxic appearance. She does not appear ill. No distress.  HENT:  Head: Normocephalic.  Right Ear: Hearing, tympanic membrane, external ear and ear canal normal. Tympanic membrane is not erythematous, not retracted and not bulging.  Left Ear: Hearing, tympanic membrane, external ear and ear canal normal. Tympanic membrane is not erythematous, not retracted and not bulging.  Nose: Mucosal edema and rhinorrhea present. Right sinus exhibits no maxillary sinus tenderness and no frontal sinus tenderness. Left sinus exhibits no maxillary sinus tenderness and no frontal sinus tenderness.  Mouth/Throat: Uvula is midline and mucous membranes are normal. Posterior oropharyngeal erythema present. No oropharyngeal exudate.  Eyes: Conjunctivae, EOM and lids are normal. Pupils are equal, round, and  reactive to light. No foreign bodies found.  Neck: Trachea normal and normal range of motion. Neck supple. Carotid bruit is not present. No mass and no thyromegaly present.  Cardiovascular: Normal rate, regular rhythm, S1 normal, S2 normal, normal heart sounds, intact distal pulses and normal pulses.  Exam reveals no gallop and no friction rub.   No murmur heard. Pulmonary/Chest: Effort normal and breath sounds normal. Not tachypneic. No respiratory distress. She has no decreased breath sounds. She has no wheezes. She has no rhonchi. She has no rales.  Neurological: She is alert.  Skin: Skin is warm, dry and intact. No rash noted.  Psychiatric: Her speech is normal and behavior is normal. Judgment normal. Her mood appears not anxious. Cognition and memory are normal. She does not exhibit a depressed mood.          Assessment & Plan:

## 2012-06-04 NOTE — Assessment & Plan Note (Signed)
Given continued productive cough and age...will broaden antibiotics to doxy for 10 days.  Symptomatic care.

## 2012-06-12 ENCOUNTER — Other Ambulatory Visit: Payer: Self-pay | Admitting: Cardiology

## 2012-06-20 ENCOUNTER — Other Ambulatory Visit: Payer: Self-pay | Admitting: *Deleted

## 2012-06-20 MED ORDER — OMEPRAZOLE 20 MG PO CPDR: 20.0000 mg | DELAYED_RELEASE_CAPSULE | Freq: Every day | ORAL | Status: DC | PRN

## 2012-07-04 ENCOUNTER — Other Ambulatory Visit: Payer: Self-pay | Admitting: Cardiology

## 2012-07-13 ENCOUNTER — Other Ambulatory Visit: Payer: Self-pay | Admitting: Cardiology

## 2012-07-16 ENCOUNTER — Other Ambulatory Visit: Payer: Self-pay | Admitting: *Deleted

## 2012-07-16 MED ORDER — RAMIPRIL 5 MG PO CAPS: ORAL_CAPSULE | ORAL | Status: DC

## 2012-08-02 ENCOUNTER — Other Ambulatory Visit: Payer: Self-pay | Admitting: Family Medicine

## 2012-08-23 ENCOUNTER — Telehealth: Payer: Self-pay | Admitting: Family Medicine

## 2012-08-23 DIAGNOSIS — M109 Gout, unspecified: Secondary | ICD-10-CM

## 2012-08-23 DIAGNOSIS — E785 Hyperlipidemia, unspecified: Secondary | ICD-10-CM

## 2012-08-23 DIAGNOSIS — I10 Essential (primary) hypertension: Secondary | ICD-10-CM

## 2012-08-23 DIAGNOSIS — E119 Type 2 diabetes mellitus without complications: Secondary | ICD-10-CM

## 2012-08-23 NOTE — Telephone Encounter (Signed)
Message copied by Excell Seltzer on Fri Aug 23, 2012  2:01 PM ------      Message from: Baldomero Lamy      Created: Tue Aug 13, 2012  1:35 PM      Regarding: Cpx labs 5/6 Tues       Please order  future cpx labs for pt's upcoming lab appt.      Thanks      Tasha       ------

## 2012-08-27 ENCOUNTER — Other Ambulatory Visit (INDEPENDENT_AMBULATORY_CARE_PROVIDER_SITE_OTHER): Payer: Medicare Other

## 2012-08-27 DIAGNOSIS — I10 Essential (primary) hypertension: Secondary | ICD-10-CM

## 2012-08-27 DIAGNOSIS — E119 Type 2 diabetes mellitus without complications: Secondary | ICD-10-CM

## 2012-08-27 LAB — COMPREHENSIVE METABOLIC PANEL
BUN: 21 mg/dL (ref 6–23)
CO2: 27 mEq/L (ref 19–32)
Calcium: 9.1 mg/dL (ref 8.4–10.5)
Chloride: 106 mEq/L (ref 96–112)
Creatinine, Ser: 1.1 mg/dL (ref 0.4–1.2)
GFR: 49.86 mL/min — ABNORMAL LOW (ref >60.00)
Glucose, Bld: 128 mg/dL — ABNORMAL HIGH (ref 70–99)
Total Bilirubin: 0.7 mg/dL (ref 0.3–1.2)

## 2012-09-03 ENCOUNTER — Ambulatory Visit (INDEPENDENT_AMBULATORY_CARE_PROVIDER_SITE_OTHER): Payer: Medicare Other | Admitting: Family Medicine

## 2012-09-03 ENCOUNTER — Encounter: Payer: Self-pay | Admitting: Family Medicine

## 2012-09-03 VITALS — BP 120/72 | HR 83 | Temp 98.1°F | Ht 65.5 in | Wt 152.5 lb

## 2012-09-03 DIAGNOSIS — I10 Essential (primary) hypertension: Secondary | ICD-10-CM | POA: Diagnosis not present

## 2012-09-03 DIAGNOSIS — E119 Type 2 diabetes mellitus without complications: Secondary | ICD-10-CM | POA: Diagnosis not present

## 2012-09-03 DIAGNOSIS — Z0000 Encounter for general adult medical examination without abnormal findings: Secondary | ICD-10-CM | POA: Diagnosis not present

## 2012-09-03 DIAGNOSIS — M109 Gout, unspecified: Secondary | ICD-10-CM

## 2012-09-03 DIAGNOSIS — E785 Hyperlipidemia, unspecified: Secondary | ICD-10-CM | POA: Diagnosis not present

## 2012-09-03 DIAGNOSIS — M810 Age-related osteoporosis without current pathological fracture: Secondary | ICD-10-CM

## 2012-09-03 LAB — HM DIABETES FOOT EXAM

## 2012-09-03 NOTE — Patient Instructions (Addendum)
Get back on track with exercise, healthy eating, weight loss. Call to schedule colonoscopy with Dr. Randa Evens as they requested. Call to schedule mammogram on your own if you are interested.. This is not recommended at this age. Stop at front desk to set up bone density.

## 2012-09-03 NOTE — Assessment & Plan Note (Signed)
Has trouble remembering to take fosamax weekly... If bone density worse.Marland Kitchen She wants to consider injections to treat instead.

## 2012-09-03 NOTE — Assessment & Plan Note (Signed)
Well controlled. Continue current medication. Encouraged exercise, weight loss, healthy eating habits.  

## 2012-09-03 NOTE — Assessment & Plan Note (Signed)
Well controlled. Continue current medication.  

## 2012-09-03 NOTE — Progress Notes (Signed)
I have personally reviewed the Medicare Annual Wellness questionnaire and have noted  1. The patient's medical and social history  2. Their use of alcohol, tobacco or illicit drugs  3. Their current medications and supplements  4. The patient's functional ability including ADL's, fall risks, home safety risks and hearing or visual  impairment.  5. Diet and physical activities  6. Evidence for depression or mood disorders  The patients weight, height, BMI and visual acuity have been recorded in the chart  I have made referrals, counseling and provided education to the patient based review of the above and I have provided the pt with a written personalized care plan for preventive services.    Diabetes: On glyburide, worsening control over time, but still fairly well controlled. Has gained some weight in last few months. Wt Readings from Last 3 Encounters:  09/03/12 152 lb 8 oz (69.174 kg)  06/04/12 148 lb 12 oz (67.473 kg)  05/20/12 146 lb (66.225 kg)    Lab Results  Component Value Date   HGBA1C 7.0* 08/27/2012  Using medications without difficulties:  Hypoglycemic episodes: None  Hyperglycemic episodes:None  Feet problems:None  Blood Sugars averaging: Not checking at home.  eye exam within last year: In last year, had floater in last week, but resolved now.  Elevated Cholesterol: Previously at goal on simvastatin 40 mg daily, fish oil.  Lab Results  Component Value Date   CHOL 155 02/27/2012   HDL 52.60 02/27/2012   LDLCALC 89 02/27/2012   TRIG 66.0 02/27/2012   CHOLHDL 3 02/27/2012  Using medications without problems: yes  Muscle aches: None  Other complaints:   Gout: no further gout flares. Used colchicine as needed.  Uric acid in nml range in 02/2012  Hypertension: Well controlled on ramipril  Using medication without problems or lightheadedness:  Chest pain with exertion: none  Edema:None  Short of breath:None  Average home BPs: not checking BP  Other issues:    Osteoporosis: Doing well on fosamax. She forgets to take the fosamax a lot!  Started for worsening osteo in 2011.  Last DXA 2011.    Review of Systems  Constitutional: Negative for fever and fatigue.  HENT: Negative for ear pain.  Eyes: Negative for pain.  Respiratory: Negative for cough, chest tightness and shortness of breath.  Cardiovascular: Negative for chest pain, palpitations and leg swelling.  Gastrointestinal: Negative for nausea, diarrhea, constipation, blood in stool and rectal pain.  Genitourinary: Negative for dysuria, vaginal bleeding, vaginal discharge, vaginal pain and pelvic pain.  No breast lesions.  Neurological: Negative for tremors, syncope, weakness and numbness.  Psychiatric/Behavioral: Negative for self-injury, dysphoric mood and agitation. The patient is not nervous/anxious.  Objective:   Physical Exam  Constitutional: Vital signs are normal. She appears well-developed and well-nourished. She is cooperative. Non-toxic appearance. She does not appear ill. No distress.  Elderly female in NAD  HENT:  Head: Normocephalic.  Right Ear: Hearing, tympanic membrane, external ear and ear canal normal.  Left Ear: Hearing, tympanic membrane, external ear and ear canal normal.  Nose: Nose normal.  Eyes: Conjunctivae, EOM and lids are normal. Pupils are equal, round, and reactive to light. No foreign bodies found.  Neck: Trachea normal and normal range of motion. Neck supple. Carotid bruit is not present. No mass and no thyromegaly present.  Cardiovascular: Normal rate, regular rhythm, S1 normal, S2 normal, normal heart sounds and intact distal pulses. Exam reveals no gallop.  No murmur heard.  Pulmonary/Chest: Effort normal and breath  sounds normal. No respiratory distress. She has no wheezes. She has no rhonchi. She has no rales.  Abdominal: Soft. Normal appearance and bowel sounds are normal. She exhibits no distension, no fluid wave, no abdominal bruit and no mass.  There is no hepatosplenomegaly. There is no tenderness. There is no rebound, no guarding and no CVA tenderness. No hernia.  Lymphadenopathy:  She has no cervical adenopathy.  She has no axillary adenopathy.  Neurological: She is alert. She has normal strength. No cranial nerve deficit or sensory deficit.  Skin: Skin is warm, dry and intact. No rash noted.  Psychiatric: Her speech is normal and behavior is normal. Judgment normal. Her mood appears not anxious. Cognition and memory are normal. She does not exhibit a depressed mood.  Diabetic foot exam:  Normal inspection  No skin breakdown  No calluses  Normal DP pulses  Normal sensation to light touch and monofilament  Nails normal  Assessment & Plan:   Annual Medicare Wellnes: The patient's preventative maintenance and recommended screening tests for an annual wellness exam were reviewed in full today.  Brought up to date unless services declined.  Counselled on the importance of diet, exercise, and its role in overall health and mortality.  The patient's FH and SH was reviewed, including their home life, tobacco status, and drug and alcohol status.   DEXA 2011 started on fosamax.  Colonoscopy in 2009,Dr. Edwards recommended repeat in 5 years. Up to Date with vaccines  Wishes to continue mammograms: last 02/2011, pt to schedule. No pap or vaginal exam needed.

## 2012-09-05 DIAGNOSIS — Z961 Presence of intraocular lens: Secondary | ICD-10-CM | POA: Diagnosis not present

## 2012-09-05 DIAGNOSIS — H01009 Unspecified blepharitis unspecified eye, unspecified eyelid: Secondary | ICD-10-CM | POA: Diagnosis not present

## 2012-09-05 DIAGNOSIS — H43399 Other vitreous opacities, unspecified eye: Secondary | ICD-10-CM | POA: Diagnosis not present

## 2012-09-05 DIAGNOSIS — M810 Age-related osteoporosis without current pathological fracture: Secondary | ICD-10-CM | POA: Diagnosis not present

## 2012-09-05 DIAGNOSIS — H04129 Dry eye syndrome of unspecified lacrimal gland: Secondary | ICD-10-CM | POA: Diagnosis not present

## 2012-09-10 ENCOUNTER — Encounter: Payer: Self-pay | Admitting: Family Medicine

## 2012-09-10 ENCOUNTER — Telehealth: Payer: Self-pay

## 2012-09-10 NOTE — Telephone Encounter (Signed)
Pt said 09/09/12, 5 pm diarrhea started over night pt had diarrhea x 6; diarrhea stopped this morning 10 AM.  1 pm pt ate toast,egg and hot tea and pt had loose BM at 2:30 pm. No abd pain, no nausea and vomiting and no fever. Pt has not taken anything for diarrhea. [facility] [address]Please advise. Pt will call back if condition changes or worsens prior to call back.

## 2012-09-12 ENCOUNTER — Encounter: Payer: Self-pay | Admitting: Family Medicine

## 2012-09-12 NOTE — Telephone Encounter (Signed)
Csall to check on pt. I would not recommend OTC meds for diarrhea. Just return to normal diet and push fluids. Let me know how she is doing.

## 2012-09-12 NOTE — Telephone Encounter (Signed)
Patient advised and she is feeling better.

## 2012-09-13 ENCOUNTER — Other Ambulatory Visit: Payer: Self-pay

## 2012-09-13 MED ORDER — ALENDRONATE SODIUM 70 MG PO TABS: 70.0000 mg | ORAL_TABLET | ORAL | Status: DC

## 2012-09-13 NOTE — Telephone Encounter (Signed)
Pt left v/m requesting refill alendronate to [facility] [address] Due to note 09/03/12 visit is pt to continue alendronate.Please advise. Pt request call back.

## 2012-09-13 NOTE — Telephone Encounter (Signed)
Medication sent to pharmacy and patient advised via message on machine

## 2012-09-17 DIAGNOSIS — H35039 Hypertensive retinopathy, unspecified eye: Secondary | ICD-10-CM | POA: Diagnosis not present

## 2012-09-17 DIAGNOSIS — H04129 Dry eye syndrome of unspecified lacrimal gland: Secondary | ICD-10-CM | POA: Diagnosis not present

## 2012-09-17 DIAGNOSIS — E119 Type 2 diabetes mellitus without complications: Secondary | ICD-10-CM | POA: Diagnosis not present

## 2012-09-17 DIAGNOSIS — Z961 Presence of intraocular lens: Secondary | ICD-10-CM | POA: Diagnosis not present

## 2012-09-17 DIAGNOSIS — H43399 Other vitreous opacities, unspecified eye: Secondary | ICD-10-CM | POA: Diagnosis not present

## 2012-09-17 DIAGNOSIS — H35369 Drusen (degenerative) of macula, unspecified eye: Secondary | ICD-10-CM | POA: Diagnosis not present

## 2012-09-27 ENCOUNTER — Other Ambulatory Visit: Payer: Self-pay | Admitting: Family Medicine

## 2012-10-21 ENCOUNTER — Ambulatory Visit (INDEPENDENT_AMBULATORY_CARE_PROVIDER_SITE_OTHER): Payer: Medicare Other | Admitting: Family Medicine

## 2012-10-21 ENCOUNTER — Encounter: Payer: Self-pay | Admitting: Family Medicine

## 2012-10-21 VITALS — BP 120/72 | HR 76 | Temp 98.1°F | Ht 65.5 in | Wt 147.5 lb

## 2012-10-21 DIAGNOSIS — R350 Frequency of micturition: Secondary | ICD-10-CM

## 2012-10-21 DIAGNOSIS — R319 Hematuria, unspecified: Secondary | ICD-10-CM | POA: Insufficient documentation

## 2012-10-21 DIAGNOSIS — L259 Unspecified contact dermatitis, unspecified cause: Secondary | ICD-10-CM | POA: Insufficient documentation

## 2012-10-21 DIAGNOSIS — N390 Urinary tract infection, site not specified: Secondary | ICD-10-CM

## 2012-10-21 LAB — POCT URINALYSIS DIPSTICK
Bilirubin, UA: NEGATIVE
Ketones, UA: NEGATIVE
Protein, UA: 30
Spec Grav, UA: 1.02
pH, UA: 8

## 2012-10-21 MED ORDER — TRIAMCINOLONE ACETONIDE 0.5 % EX CREA: TOPICAL_CREAM | Freq: Two times a day (BID) | CUTANEOUS | Status: DC

## 2012-10-21 MED ORDER — CIPROFLOXACIN HCL 250 MG PO TABS: 250.0000 mg | ORAL_TABLET | Freq: Two times a day (BID) | ORAL | Status: DC

## 2012-10-21 NOTE — Assessment & Plan Note (Signed)
Likely due to UTI, but will check UA to assure resolution of blood in urine.

## 2012-10-21 NOTE — Assessment & Plan Note (Signed)
Treat with topical steroid cream. 

## 2012-10-21 NOTE — Patient Instructions (Addendum)
For rash .Marland Kitchen Apply steroid cream for likely plant dermatitis. Cal if rash becomes painful instead.  Start and complete a course of antibiotics.  Call if not better at the end. Push fluids. We we call you with urine culture results. Return in 2 weeks for a urine check only given hematuria.

## 2012-10-21 NOTE — Assessment & Plan Note (Signed)
Uncomplicated. Treat with 3 days of cipro.Marland Kitchen

## 2012-10-21 NOTE — Progress Notes (Signed)
  Subjective:    Patient ID: [name], female    DOB: January 22, 1923, 79 y.o.   MRN: [id number]  HPI  79 year old female with history of DM, well controlled  presents with frequent urination, urgency and dysuria x 3 days.    She has been drinking cranberry juice. No abdominal pain, no fever, no flank pain.  She has also noted a rash in last 3 days.. Redness on right abdomen. Very itchy. Nontender. No clear blisters  Also thinks she has poision ivy on right hand... Treating with OTC remedy.  She has been working in yard lately  Review of Systems  Constitutional: Negative for fever and fatigue.  HENT: Negative for ear pain.   Eyes: Negative for pain.  Respiratory: Negative for chest tightness and shortness of breath.   Cardiovascular: Negative for chest pain, palpitations and leg swelling.  Gastrointestinal: Negative for abdominal pain.  Genitourinary: Positive for urgency and frequency. Negative for hematuria, flank pain, decreased urine volume and vaginal pain.       Objective:   Physical Exam  Constitutional: Vital signs are normal. She appears well-developed and well-nourished. She is cooperative.  Non-toxic appearance. She does not appear ill. No distress.  HENT:  Head: Normocephalic.  Right Ear: Hearing, tympanic membrane, external ear and ear canal normal. Tympanic membrane is not erythematous, not retracted and not bulging.  Left Ear: Hearing, tympanic membrane, external ear and ear canal normal. Tympanic membrane is not erythematous, not retracted and not bulging.  Nose: No mucosal edema or rhinorrhea. Right sinus exhibits no maxillary sinus tenderness and no frontal sinus tenderness. Left sinus exhibits no maxillary sinus tenderness and no frontal sinus tenderness.  Mouth/Throat: Uvula is midline, oropharynx is clear and moist and mucous membranes are normal.  Eyes: Conjunctivae, EOM and lids are normal. Pupils are equal, round, and reactive to light. No foreign bodies  found.  Neck: Trachea normal and normal range of motion. Neck supple. Carotid bruit is not present. No mass and no thyromegaly present.  Cardiovascular: Normal rate, regular rhythm, S1 normal, S2 normal, normal heart sounds, intact distal pulses and normal pulses.  Exam reveals no gallop and no friction rub.   No murmur heard. Pulmonary/Chest: Effort normal and breath sounds normal. Not tachypneic. No respiratory distress. She has no decreased breath sounds. She has no wheezes. She has no rhonchi. She has no rales.  Abdominal: Soft. Normal appearance and bowel sounds are normal. There is no tenderness.  Neurological: She is alert.  Skin: Skin is warm, dry and intact. No rash noted.     Crosses midline slightly, erythema, excoriations, no blisters  Non tender.  Psychiatric: Her speech is normal and behavior is normal. Judgment and thought content normal. Her mood appears not anxious. Cognition and memory are normal. She does not exhibit a depressed mood.          Assessment & Plan:

## 2012-10-23 DIAGNOSIS — Z86010 Personal history of colonic polyps: Secondary | ICD-10-CM | POA: Diagnosis not present

## 2012-10-23 LAB — URINE CULTURE

## 2012-10-31 ENCOUNTER — Ambulatory Visit (INDEPENDENT_AMBULATORY_CARE_PROVIDER_SITE_OTHER): Payer: Medicare Other | Admitting: Family Medicine

## 2012-10-31 ENCOUNTER — Encounter: Payer: Self-pay | Admitting: Family Medicine

## 2012-10-31 ENCOUNTER — Ambulatory Visit (INDEPENDENT_AMBULATORY_CARE_PROVIDER_SITE_OTHER)
Admission: RE | Admit: 2012-10-31 | Discharge: 2012-10-31 | Disposition: A | Discharge status: Home / Self Care | Payer: Medicare Other | Source: Ambulatory Visit | Attending: Family Medicine | Admitting: Family Medicine

## 2012-10-31 VITALS — BP 140/88 | HR 80 | Temp 98.0°F | Ht 65.5 in | Wt 148.5 lb

## 2012-10-31 DIAGNOSIS — M25529 Pain in unspecified elbow: Secondary | ICD-10-CM | POA: Diagnosis not present

## 2012-10-31 DIAGNOSIS — S59909A Unspecified injury of unspecified elbow, initial encounter: Secondary | ICD-10-CM | POA: Diagnosis not present

## 2012-10-31 DIAGNOSIS — M25521 Pain in right elbow: Secondary | ICD-10-CM

## 2012-10-31 DIAGNOSIS — T07XXXA Unspecified multiple injuries, initial encounter: Secondary | ICD-10-CM

## 2012-10-31 DIAGNOSIS — S59919A Unspecified injury of unspecified forearm, initial encounter: Secondary | ICD-10-CM | POA: Diagnosis not present

## 2012-10-31 DIAGNOSIS — IMO0002 Reserved for concepts with insufficient information to code with codable children: Secondary | ICD-10-CM

## 2012-10-31 NOTE — Patient Instructions (Signed)
F/u 7-10 days with Dr. Patsy Lager

## 2012-11-02 NOTE — Progress Notes (Signed)
Nature conservation officer at [facility] [address] [place] [address] Phone: [phone] Fax: [phone]  Date:  10/31/2012   Name:  [name]   DOB:  09/07/1922   MRN:  [id number] Gender: female Age: 79 y.o.  Primary Physician:  Kerby Nora, MD  Evaluating MD: Hannah Beat, MD  Chief Complaint: Elbow Injury   History of Present Illness:  [name] is a 79 y.o. very pleasant female patient who presents with the following:  Pleasant elderly patient fell, DOI 10/29/2012, on her right elbow, and she obtained significant scrapes on her r knee and also is now having a lot of swelling around her elbow. She is walking fine and not having any significant pain around her right knee. She is able to move her elbow ok, but the skin is also denuded quite a bit in this area.   Past Medical History, Surgical History, Social History, Family History, Problem List, Medications, and Allergies have been reviewed and updated if relevant.  Current Outpatient Prescriptions on File Prior to Visit  Medication Sig Dispense Refill  . alendronate (FOSAMAX) 70 MG tablet Take 1 tablet (70 mg total) by mouth every 7 (seven) days. Take with a full glass of water on an empty stomach.  4 tablet  6  . aspirin 325 MG tablet Take 162 mg by mouth daily.       . Calcium Citrate (CITRACAL PO) Take by mouth daily.      . fish oil-omega-3 fatty acids 1000 MG capsule Take 1 g by mouth daily.        . Magnesium Hydroxide (PHILLIPS MILK OF MAGNESIA PO) Take 1 tablet by mouth daily. 3-4 times per week       . Multiple Vitamin (MULTIVITAMIN) tablet Take 1 tablet by mouth daily.        Marland Kitchen omeprazole (PRILOSEC) 20 MG capsule Take 1 capsule (20 mg total) by mouth daily as needed.  30 capsule  6  . ramipril (ALTACE) 5 MG capsule TAKE ONE CAPSULE BY MOUTH EVERY DAY  30 capsule  11  . simvastatin (ZOCOR) 40 MG tablet TAKE 1 TABLET BY MOUTH AT BEDTIME  30 tablet  1   No current facility-administered medications  on file prior to visit.    Review of Systems:  GEN: No fevers, chills. Nontoxic. Primarily MSK c/o today. MSK: Detailed in the HPI GI: tolerating PO intake without difficulty Neuro: No numbness, parasthesias, or tingling associated. Otherwise the pertinent positives of the ROS are noted above.    Physical Examination: BP 140/88  Pulse 80  Temp(Src) 98 F (36.7 C) (Oral)  Ht 5' 5.5" (1.664 m)  Wt 148 lb 8 oz (67.359 kg)  BMI 24.33 kg/m2  SpO2 98%   GEN: WDWN, NAD, Non-toxic, Alert & Oriented x 3 HEENT: Atraumatic, Normocephalic.  Ears and Nose: No external deformity. EXTR: No clubbing/cyanosis/edema NEURO: Normal gait.  PSYCH: Normally interactive. Conversant. Not depressed or anxious appearing.  Calm demeanor.   R elbow Ecchymosis or edema: neg ROM: full flexion, extension, pronation, supination Shoulder ROM: Full Flexion: 5/5 Extension: 5/5 Supination: 5/5  Pronation: 5/5 Wrist ext: 5/5 Wrist flexion: 5/5 TTP mostly around the olecranon ECRB tenderness: ttp Medial epicondyle: ttp Lateral epicondyle, ttp grip: 5/5  sensation intact Some swelling and topical abrasions  R knee, good ROM. NT with tibia and fibular compression. Mild abrasions  Dg Elbow Complete Right  10/31/2012   *RADIOLOGY REPORT*  Clinical Data: Injury from fall 2 days  previously.  Right elbow pain.  RIGHT ELBOW - COMPLETE 3+ VIEW  Comparison: None.  Findings: No positive fat pad sign is seen to suggest joint effusion.  Alignment is normal.  No fracture or dislocation is evident.  There is osteopenic appearance of bones.  There is minimal spurring of posterior aspect of the olecranon and side of the triceps attachment.  There is suggestion of faint calcific density adjacent to the medial epicondyle of the humerus which may be associate with previous trauma or epicondylitis.  IMPRESSION: No fracture or dislocation is evident.  No joint effusion is seen. Chronic findings are detailed above.   Original  Report Authenticated By: Onalee Hua Call    Assessment and Plan:  Elbow pain, right - Plan: DG Elbow Complete Right  Abrasions of multiple sites  There is a small opacity at the olecranon near the point of maximal pain that could represent a small avulsion. This could also be chronic in a 79 year old.  Patient has a sling at home. Wear for 1 week then recheck with me next week.  Orders Today:  Orders Placed This Encounter  Procedures  . DG Elbow Complete Right    Standing Status: Future     Number of Occurrences: 1     Standing Expiration Date: 01/01/2014    Order Specific Question:  Reason for Exam (SYMPTOM  OR DIAGNOSIS REQUIRED)    Answer:  elbow pain    Order Specific Question:  Preferred imaging location?    Answer:  [facility]    Updated Medication List: (Includes new medications, updates to list, dose adjustments) No orders of the defined types were placed in this encounter.    Medications Discontinued: Medications Discontinued During This Encounter  Medication Reason  . triamcinolone cream (KENALOG) 0.5 % Error  . ciprofloxacin (CIPRO) 250 MG tablet Error      Signed, Nayan Proch T. Graysen [place]yard, MD 11/02/2012 4:28 PM

## 2012-11-05 ENCOUNTER — Other Ambulatory Visit: Payer: Medicare Other

## 2012-11-06 ENCOUNTER — Other Ambulatory Visit: Payer: Medicare Other

## 2012-11-06 ENCOUNTER — Ambulatory Visit (INDEPENDENT_AMBULATORY_CARE_PROVIDER_SITE_OTHER): Payer: Medicare Other | Admitting: Family Medicine

## 2012-11-06 VITALS — BP 120/84 | HR 84 | Temp 97.8°F | Ht 65.5 in | Wt 149.8 lb

## 2012-11-06 DIAGNOSIS — M25521 Pain in right elbow: Secondary | ICD-10-CM

## 2012-11-06 DIAGNOSIS — R319 Hematuria, unspecified: Secondary | ICD-10-CM

## 2012-11-06 DIAGNOSIS — M25529 Pain in unspecified elbow: Secondary | ICD-10-CM | POA: Diagnosis not present

## 2012-11-06 LAB — POCT URINALYSIS DIPSTICK
Bilirubin, UA: NEGATIVE
Glucose, UA: NEGATIVE
Ketones, UA: NEGATIVE
Leukocytes, UA: NEGATIVE
Nitrite, UA: NEGATIVE

## 2012-11-06 NOTE — Progress Notes (Signed)
Nature conservation officer at [facility] [address] [place] [address] [place] [address] Phone: [phone] Fax: [phone]  Date:  11/06/2012   Name:  [name]   DOB:  [birth year]   MRN:  [id number] Gender: female Age: 79 y.o.  Primary Physician:  Kerby Nora, MD  Evaluating MD: Hannah Beat, MD  Chief Complaint: Follow-up   History of Present Illness:  [name] is a 79 y.o. very pleasant female patient who presents with the following:  F/u 1 week, elbow:  She feels much, much better with full ROM. NT. Scab is mild.  F/u hematuria from prior uti  Past Medical History, Surgical History, Social History, Family History, Problem List, Medications, and Allergies have been reviewed and updated if relevant.  Current Outpatient Prescriptions on File Prior to Visit  Medication Sig Dispense Refill  . alendronate (FOSAMAX) 70 MG tablet Take 1 tablet (70 mg total) by mouth every 7 (seven) days. Take with a full glass of water on an empty stomach.  4 tablet  6  . aspirin 325 MG tablet Take 162 mg by mouth daily.       . Calcium Citrate (CITRACAL PO) Take by mouth daily.      . fish oil-omega-3 fatty acids 1000 MG capsule Take 1 g by mouth daily.        . Magnesium Hydroxide (PHILLIPS MILK OF MAGNESIA PO) Take 1 tablet by mouth daily. 3-4 times per week       . Multiple Vitamin (MULTIVITAMIN) tablet Take 1 tablet by mouth daily.        Marland Kitchen omeprazole (PRILOSEC) 20 MG capsule Take 1 capsule (20 mg total) by mouth daily as needed.  30 capsule  6  . ramipril (ALTACE) 5 MG capsule TAKE ONE CAPSULE BY MOUTH EVERY DAY  30 capsule  11  . simvastatin (ZOCOR) 40 MG tablet TAKE 1 TABLET BY MOUTH AT BEDTIME  30 tablet  1   No current facility-administered medications on file prior to visit.    Review of Systems:  GEN: No fevers, chills. Nontoxic. Primarily MSK c/o today. MSK: Detailed in the HPI GI: tolerating PO intake without difficulty Neuro: No numbness, parasthesias, or tingling  associated. Otherwise the pertinent positives of the ROS are noted above.    Physical Examination: BP 120/84  Pulse 84  Temp(Src) 97.8 F (36.6 C) (Oral)  Ht 5' 5.5" (1.664 m)  Wt 149 lb 12 oz (67.926 kg)  BMI 24.53 kg/m2  SpO2 97%   GEN: WDWN, NAD, Non-toxic, Alert & Oriented x 3 HEENT: Atraumatic, Normocephalic.  Ears and Nose: No external deformity. EXTR: No clubbing/cyanosis/edema NEURO: Normal gait.  PSYCH: Normally interactive. Conversant. Not depressed or anxious appearing.  Calm demeanor.   r elbow Ecchymosis or edema: neg ROM: full flexion, extension, pronation, supination Shoulder ROM: Full Flexion: 5/5 Extension: 5/5 Supination: 5/5  Pronation: 5/5 Wrist ext: 5/5 Wrist flexion: 5/5 No gross bony abnormality Varus and Valgus stress: stable ECRB tenderness: neg Medial epicondyle: NT Lateral epicondyle, resisted wrist extension from wrist full pronation and flexion: NT grip: 5/5 Skin with small scab sensation intact   Assessment and Plan:  Elbow pain, right  Hematuria - Plan: POCT Urinalysis Dipstick  Healed, feeling much better  Results for orders placed in visit on 11/06/12  POCT URINALYSIS DIPSTICK      Result Value Range   Color, UA yellow     Clarity, UA clear     Glucose, UA neg  Bilirubin, UA neg     Ketones, UA neg     Spec Grav, UA 1.020     Blood, UA neg     pH, UA 6.0     Protein, UA neg     Urobilinogen, UA negative     Nitrite, UA neg     Leukocytes, UA Negative       Orders Today:  Orders Placed This Encounter  Procedures  . POCT Urinalysis Dipstick    Updated Medication List: (Includes new medications, updates to list, dose adjustments) No orders of the defined types were placed in this encounter.    Medications Discontinued: There are no discontinued medications.    Signed, Elpidio Galea. Ferman Basilio, MD 11/06/2012 3:23 PM

## 2012-11-07 ENCOUNTER — Encounter: Payer: Self-pay | Admitting: Family Medicine

## 2012-11-28 ENCOUNTER — Telehealth: Payer: Self-pay

## 2012-11-28 ENCOUNTER — Other Ambulatory Visit: Payer: Self-pay | Admitting: Family Medicine

## 2012-11-28 NOTE — Telephone Encounter (Signed)
Done

## 2012-11-28 NOTE — Telephone Encounter (Signed)
Pt has an infected bump on back 1" across with pus in center, red around it and itching. Pt said Dr Ermalene Searing opened something similar on stomach but cannot remember when. Pt request appt.tomorrow because pt has dental appt today. Pt has no fever,no pain and no red streaks. appt scheduled 11/29/12 at 9 am.

## 2012-11-28 NOTE — Telephone Encounter (Signed)
Noted.  Please put in 30 min appt if available

## 2012-11-29 ENCOUNTER — Encounter: Payer: Self-pay | Admitting: Family Medicine

## 2012-11-29 ENCOUNTER — Ambulatory Visit (INDEPENDENT_AMBULATORY_CARE_PROVIDER_SITE_OTHER): Payer: Medicare Other | Admitting: Family Medicine

## 2012-11-29 VITALS — BP 132/64 | HR 72 | Temp 97.8°F | Ht 65.5 in | Wt 149.0 lb

## 2012-11-29 DIAGNOSIS — L723 Sebaceous cyst: Secondary | ICD-10-CM | POA: Diagnosis not present

## 2012-11-29 MED ORDER — DOXYCYCLINE HYCLATE 100 MG PO TABS: 100.0000 mg | ORAL_TABLET | Freq: Two times a day (BID) | ORAL | Status: DC

## 2012-11-29 NOTE — Progress Notes (Signed)
  Subjective:    Patient ID: [name], female    DOB: 05-Mar-1923, 79 y.o.   MRN: [id number]  HPI 79 year old female with history of well controlled diabetes presents with lesion on her back for several months, more recently it has become irritated, itching in last week. No pain, no discharge, some redness with central pustule.   No fever, no N/V. Feel well otherwise.  She has had I and D of infected sebaceous cyst on abdomen in past.   Review of Systems  Constitutional: Negative for fever and fatigue.  Respiratory: Negative for shortness of breath.   Cardiovascular: Negative for chest pain, palpitations and leg swelling.  Gastrointestinal: Negative for abdominal pain.       Objective:   Physical Exam  Cardiovascular: Normal rate, regular rhythm, normal heart sounds and intact distal pulses.  Exam reveals no gallop and no friction rub.   No murmur heard. Pulmonary/Chest: Effort normal and breath sounds normal.  Skin:  4 cm enlarged, red cyst on right upper back, central pore, No discharge, increase in warmth, NO surrounding red streaking to other parts of back          Assessment & Plan:  I&D  Meds, vitals, and allergies reviewed.   Indication: suspect abscess  Pt complaints of: erythema, pain, swelling  Location:  Size:  Verbal informed consent obtained.  Pt aware of risks not limited to but including infection, bleeding, damage to near by organs.  Prep: etoh/betadine  Anesthesia: 1%lidocaine with epi, good effect  Incision made with #11 blade  Would explored, sebaceous material and pus expressed and significant portion of cyst wall removed  Wound packed with iodoform gauze  Tolerated well, minimal bleeding  Routine postprocedure instructions d/w pt- remove packing in 24-48h, keep area clean  ( wash with warm soapy water twice daily)and bandaged, follow up if concerns/spreading erythema/pain.

## 2012-11-29 NOTE — Patient Instructions (Signed)
Start antibiotics.  Keep area clean and dry. Tommorow pull out packing  Wash twice daily with warm soapy water... We want area to stay open and draining for a while. Cover with badage, can apply antibiotic ointment.  We will call you with culture results.  Follow up for re-evaluation next week.  Call sooner if fever, redness spreading or pain increasing.

## 2012-11-29 NOTE — Addendum Note (Signed)
Addended by: Eliezer Bottom on: 11/29/2012 11:20 AM   Modules accepted: Orders

## 2012-12-02 LAB — WOUND CULTURE

## 2012-12-04 ENCOUNTER — Encounter: Payer: Self-pay | Admitting: Family Medicine

## 2012-12-04 ENCOUNTER — Ambulatory Visit (INDEPENDENT_AMBULATORY_CARE_PROVIDER_SITE_OTHER): Payer: Medicare Other | Admitting: Family Medicine

## 2012-12-04 VITALS — BP 130/62 | HR 72 | Temp 98.0°F | Ht 65.5 in | Wt 148.0 lb

## 2012-12-04 DIAGNOSIS — L089 Local infection of the skin and subcutaneous tissue, unspecified: Secondary | ICD-10-CM

## 2012-12-04 DIAGNOSIS — L723 Sebaceous cyst: Secondary | ICD-10-CM

## 2012-12-04 NOTE — Patient Instructions (Addendum)
Healing well. Go to washing daily. Only need bandaid if oozing some, can leave off. Call if redness or pain returns.

## 2012-12-04 NOTE — Progress Notes (Signed)
  Subjective:    Patient ID: [name], female    DOB: Apr 15, 1923, 79 y.o.   MRN: [id number]  HPI  79 year old female with recent I and D for infected sebaceous cyst, placed on doxycycline presents for 5 day follow up.  She reports packing removed successfully.   She reports it is less red and swollen.  No tenderness, no pain, no fever. No N/V   Review of Systems     Objective:   Physical Exam        Assessment & Plan:

## 2012-12-04 NOTE — Assessment & Plan Note (Signed)
Resolving s/p I and D. Complete antibiotics.

## 2012-12-31 ENCOUNTER — Ambulatory Visit (INDEPENDENT_AMBULATORY_CARE_PROVIDER_SITE_OTHER): Payer: Medicare Other | Admitting: Internal Medicine

## 2012-12-31 ENCOUNTER — Encounter: Payer: Self-pay | Admitting: Internal Medicine

## 2012-12-31 VITALS — BP 120/70 | HR 82 | Temp 97.7°F | Wt 148.0 lb

## 2012-12-31 DIAGNOSIS — B372 Candidiasis of skin and nail: Secondary | ICD-10-CM | POA: Diagnosis not present

## 2012-12-31 MED ORDER — KETOCONAZOLE 2 % EX CREA: TOPICAL_CREAM | Freq: Every day | CUTANEOUS | Status: DC

## 2012-12-31 NOTE — Patient Instructions (Signed)
Yeast Infection of the Skin Some yeast on the skin is normal, but sometimes it causes an infection. If you have a yeast infection, it shows up as white or light brown patches on brown skin. You can see it better in the summer on tan skin. It causes light-colored holes in your suntan. It can happen on any area of the body. This cannot be passed from person to person. HOME CARE  Scrub your skin daily with a dandruff shampoo. Your rash may take a couple weeks to get well.  Do not scratch or itch the rash. GET HELP RIGHT AWAY IF:   You get another infection from scratching. The skin may get warm, red, and may ooze fluid.  The infection does not seem to be getting better. MAKE SURE YOU:  Understand these instructions.  Will watch your condition.  Will get help right away if you are not doing well or get worse. Document Released: 03/23/2008 Document Revised: 07/03/2011 Document Reviewed: 03/23/2008 [employer] Patient Information 2014 [employer].  

## 2012-12-31 NOTE — Progress Notes (Signed)
Subjective:    Patient ID: [name], female    DOB: 03/30/1923, 79 y.o.   MRN: [id number]  HPI  Pt presents to the clinic today with c/o a rash under her left breast. She noticed this 2 days ago. She reports that it is very itchy. She has never had a rash like this before. She did put neosporin, baby powder and hydrocortisone cream. Nothing has helped. It does not seem to be spreading or getting worse.  Review of Systems      Past Medical History  Diagnosis Date  . Hyperlipidemia   . Hypertension   . Diabetes mellitus     Type II  . GERD (gastroesophageal reflux disease)   . Allergy   . Syncope and collapse     1 episode  . Orthostatic hypotension 04/2006    [facility]  . Palpitations   . Acute pharyngitis   . Other abscess of vulva   . Acute sinusitis, unspecified   . Acute upper respiratory infections of unspecified site   . Hemangioma of unspecified site   . Anal and rectal polyp   . Cataract     History of  . Osteopenia   . Osteoarthritis   . Gout   . Cavernous angioma   . Degenerative cervical disc 02/2006    Current Outpatient Prescriptions  Medication Sig Dispense Refill  . alendronate (FOSAMAX) 70 MG tablet Take 1 tablet (70 mg total) by mouth every 7 (seven) days. Take with a full glass of water on an empty stomach.  4 tablet  6  . aspirin 325 MG tablet Take 162 mg by mouth daily.       . Calcium Citrate (CITRACAL PO) Take by mouth daily.      . fish oil-omega-3 fatty acids 1000 MG capsule Take 1 g by mouth daily.        . Magnesium Hydroxide (PHILLIPS MILK OF MAGNESIA PO) Take 1 tablet by mouth daily. 3-4 times per week       . Multiple Vitamin (MULTIVITAMIN) tablet Take 1 tablet by mouth daily.        Marland Kitchen omeprazole (PRILOSEC) 20 MG capsule Take 1 capsule (20 mg total) by mouth daily as needed.  30 capsule  6  . ramipril (ALTACE) 5 MG capsule TAKE ONE CAPSULE BY MOUTH EVERY DAY  30 capsule  11  . simvastatin (ZOCOR) 40 MG tablet TAKE 1 TABLET BY MOUTH  AT BEDTIME  30 tablet  1   No current facility-administered medications for this visit.    Allergies  Allergen Reactions  . Atorvastatin     REACTION: intolerant  . Codeine     REACTION: GI upset  . Rosuvastatin     REACTION: intolerant    Family History  Problem Relation Age of Onset  . Cancer Maternal Aunt     Breast CA  . Heart disease Other     CHF    History   Social History  . Marital Status: Married    Spouse Name: N/A    Number of Children: 3  . Years of Education: N/A   Occupational History  . 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 Smoking status: Former Games developer  . Smokeless tobacco: Never Used     Comment: quit 1970  . Alcohol Use: Yes     Comment: wine occassionally  . Drug Use: No  . Sexual Activity: Not on file  Other Topics Concern  . Not on file   Social History Narrative   Diet: fruit and veggies, water, lean protein.   Activity limited secondary to knee discomfort/arthritis, but going to church exersice group two times a week.    Has living will, full code. HCPOA: Terressa Koyanagi (reviewed 2014)                 Constitutional: Denies fever, malaise, fatigue, headache or abrupt weight changes.  Skin: Pt reports rash under left breast. Denies redness, lesions or ulcercations.    No other specific complaints in a complete review of systems (except as listed in HPI above).   Objective:   Physical Exam    BP 120/70  Pulse 82  Temp(Src) 97.7 F (36.5 C)  Wt 148 lb (67.132 kg)  BMI 24.25 kg/m2 Wt Readings from Last 3 Encounters:  12/31/12 148 lb (67.132 kg)  12/04/12 148 lb (67.132 kg)  11/29/12 149 lb (67.586 kg)    General: Appears her stated age, well developed, well nourished in NAD. Skin: Warm, dry and intact. Patch of erythematous papules with white coating noted under left breast. Cardiovascular: Normal rate and rhythm. S1,S2 noted.  No murmur, rubs or gallops noted. No JVD  or BLE edema. No carotid bruits noted. Pulmonary/Chest: Normal effort and positive vesicular breath sounds. No respiratory distress. No wheezes, rales or ronchi noted.    BMET    Component Value Date/Time   NA 139 08/27/2012 0826   K 4.6 08/27/2012 0826   CL 106 08/27/2012 0826   CO2 27 08/27/2012 0826   GLUCOSE 128* 08/27/2012 0826   BUN 21 08/27/2012 0826   CREATININE 1.1 08/27/2012 0826   CALCIUM 9.1 08/27/2012 0826   GFRNONAA 53.56 10/19/2009 0839   GFRAA  Value: >60        The eGFR has been calculated using the MDRD equation. This calculation has not been validated in all clinical situations. eGFR's persistently <60 mL/min signify possible Chronic Kidney Disease. 02/15/2009 0745    Lipid Panel     Component Value Date/Time   CHOL 155 02/27/2012 0839   TRIG 66.0 02/27/2012 0839   HDL 52.60 02/27/2012 0839   CHOLHDL 3 02/27/2012 0839   VLDL 13.2 02/27/2012 0839   LDLCALC 89 02/27/2012 0839    CBC    Component Value Date/Time   WBC 12.4* 04/05/2011 1641   RBC 4.39 04/05/2011 1641   HGB 13.8 04/05/2011 1641   HCT 41.5 04/05/2011 1641   PLT 237.0 04/05/2011 1641   MCV 94.6 04/05/2011 1641   MCHC 33.2 04/05/2011 1641   RDW 13.9 04/05/2011 1641   LYMPHSABS 2.1 04/05/2011 1641   MONOABS 0.8 04/05/2011 1641   EOSABS 0.1 04/05/2011 1641   BASOSABS 0.1 04/05/2011 1641    Hgb A1C Lab Results  Component Value Date   HGBA1C 7.0* 08/27/2012       Assessment & Plan:   Yeast infections of the skin under the left breast:  eRx for ketaconazole cream Instructed to keep the area dry Do not use baby powder  RTC as needed or if symptoms persist or worsen

## 2013-01-14 DIAGNOSIS — Z23 Encounter for immunization: Secondary | ICD-10-CM | POA: Diagnosis not present

## 2013-01-29 ENCOUNTER — Other Ambulatory Visit: Payer: Self-pay | Admitting: Family Medicine

## 2013-01-29 NOTE — Telephone Encounter (Signed)
Pt request status of simvastatin refill.advised refill done earlier; pt will ck with pharmacy.

## 2013-02-28 ENCOUNTER — Telehealth: Payer: Self-pay | Admitting: Family Medicine

## 2013-02-28 DIAGNOSIS — E119 Type 2 diabetes mellitus without complications: Secondary | ICD-10-CM

## 2013-02-28 DIAGNOSIS — E785 Hyperlipidemia, unspecified: Secondary | ICD-10-CM

## 2013-02-28 NOTE — Telephone Encounter (Signed)
Labs entered for follow up

## 2013-03-04 ENCOUNTER — Other Ambulatory Visit (INDEPENDENT_AMBULATORY_CARE_PROVIDER_SITE_OTHER): Payer: Medicare Other

## 2013-03-04 DIAGNOSIS — I10 Essential (primary) hypertension: Secondary | ICD-10-CM

## 2013-03-04 DIAGNOSIS — E785 Hyperlipidemia, unspecified: Secondary | ICD-10-CM | POA: Diagnosis not present

## 2013-03-04 DIAGNOSIS — E119 Type 2 diabetes mellitus without complications: Secondary | ICD-10-CM | POA: Diagnosis not present

## 2013-03-04 LAB — COMPREHENSIVE METABOLIC PANEL
Albumin: 3.7 g/dL (ref 3.5–5.2)
BUN: 20 mg/dL (ref 6–23)
CO2: 27 mEq/L (ref 19–32)
Calcium: 9.1 mg/dL (ref 8.4–10.5)
Chloride: 108 mEq/L (ref 96–112)
GFR: 56.91 mL/min — ABNORMAL LOW (ref >60.00)
Glucose, Bld: 133 mg/dL — ABNORMAL HIGH (ref 70–99)
Potassium: 4.3 mEq/L (ref 3.5–5.1)

## 2013-03-04 LAB — LIPID PANEL
Cholesterol: 143 mg/dL (ref 0–200)
Total CHOL/HDL Ratio: 3
Triglycerides: 77 mg/dL (ref 0.0–149.0)

## 2013-03-07 ENCOUNTER — Ambulatory Visit (INDEPENDENT_AMBULATORY_CARE_PROVIDER_SITE_OTHER): Payer: Medicare Other | Admitting: Family Medicine

## 2013-03-07 ENCOUNTER — Encounter: Payer: Self-pay | Admitting: Family Medicine

## 2013-03-07 VITALS — BP 136/74 | HR 85 | Temp 97.6°F | Ht 65.5 in | Wt 148.5 lb

## 2013-03-07 DIAGNOSIS — B372 Candidiasis of skin and nail: Secondary | ICD-10-CM

## 2013-03-07 DIAGNOSIS — R21 Rash and other nonspecific skin eruption: Secondary | ICD-10-CM

## 2013-03-07 DIAGNOSIS — E785 Hyperlipidemia, unspecified: Secondary | ICD-10-CM

## 2013-03-07 DIAGNOSIS — E119 Type 2 diabetes mellitus without complications: Secondary | ICD-10-CM | POA: Diagnosis not present

## 2013-03-07 DIAGNOSIS — I10 Essential (primary) hypertension: Secondary | ICD-10-CM | POA: Diagnosis not present

## 2013-03-07 DIAGNOSIS — J309 Allergic rhinitis, unspecified: Secondary | ICD-10-CM

## 2013-03-07 MED ORDER — FLUTICASONE PROPIONATE 50 MCG/ACT NA SUSP: 2.0000 | Freq: Every day | NASAL | Status: DC

## 2013-03-07 MED ORDER — KETOCONAZOLE 2 % EX CREA: TOPICAL_CREAM | Freq: Every day | CUTANEOUS | Status: DC

## 2013-03-07 NOTE — Assessment & Plan Note (Signed)
?   seb derm. Treat with ketoconazole cream... Refer to derm if not improving.

## 2013-03-07 NOTE — Assessment & Plan Note (Signed)
Improved control from last check on no med. Encouraged exercise, weight loss, healthy eating habits.

## 2013-03-07 NOTE — Patient Instructions (Addendum)
Start nasal steroid daily for allergies. Call if not improving. Can use ketoconazole for rash on face. Call if skin rash on face worsening and if you are interested in dermatology referral. Call to make yearly routine follow up with cardiologist.

## 2013-03-07 NOTE — Progress Notes (Signed)
Subjective:    Patient ID: [name], female    DOB: [birth year], 78 y.o.   MRN: [id number]  HPI  79 year old female present for 6 month follow up.  She has intermittant nostrils stop up at time. Has post nasal drip. Worse off and on. No sneezing, no itchy eye. Bad 1 month ago, ongoing for years.  Rash at nares and eyebrows for years.. Never tried ketoconazole.  Now in last month with red splotches, flaky on cheeks and under nose.  Diabetes: Stable control on no med.  Wt Readings from Last 3 Encounters:  03/07/13 148 lb 8 oz (67.359 kg)  12/31/12 148 lb (67.132 kg)  12/04/12 148 lb (67.132 kg)  Has lost some weight since last DM check.  Lab Results  Component Value Date   HGBA1C 6.9* 03/04/2013   Using medications without difficulties:  Hypoglycemic episodes: None  Hyperglycemic episodes:None  Feet problems:None  Blood Sugars averaging: Not checking at home.  eye exam within last year: In last year, had floater in last week, but resolved now.  Moderate diet.Marland Kitchen Has been trying to improve.  Exercise: walking some  Elevated Cholesterol: Back at goal on simvastatin 40 mg daily, fish oil.  Lab Results  Component Value Date   CHOL 143 03/04/2013   HDL 55.20 03/04/2013   LDLCALC 72 03/04/2013   TRIG 77.0 03/04/2013   CHOLHDL 3 03/04/2013  Using medications without problems: yes  Muscle aches: None  Other complaints:   Gout: no further gout flares. Used colchicine as needed.  Uric acid in nml range in 02/2012   Hypertension: Tolerably well  controlled on ramipril  Given age... Goal BP < 130/80 BP Readings from Last 3 Encounters:  03/07/13 136/74  12/31/12 120/70  12/04/12 130/62  Using medication without problems or lightheadedness:  Chest pain with exertion: none  Edema:None  Short of breath:None  Average home BPs: not checking BP  Other issues:   Had episode of  woozy feeling after 3 hours, had not eaten. She ate and felt better. She was also under a lot  of pressure to win the game.   Review of Systems  Constitutional: Negative for fever and fatigue.  HENT: Negative for ear pain.   Eyes: Negative for pain.  Respiratory: Negative for chest tightness and shortness of breath.   Cardiovascular: Negative for chest pain, palpitations and leg swelling.  Gastrointestinal: Negative for abdominal pain.  Genitourinary: Negative for dysuria.  Musculoskeletal:       Noted some swelling on left dorsal foot , no pain       Objective:   Physical Exam  Constitutional: She is oriented to person, place, and time. Vital signs are normal. She appears well-developed and well-nourished. She is cooperative.  Non-toxic appearance. She does not appear ill. No distress.  Elderly female in NAD  HENT:  Head: Normocephalic.  Right Ear: Hearing, tympanic membrane, external ear and ear canal normal. Tympanic membrane is not erythematous, not retracted and not bulging.  Left Ear: Hearing, tympanic membrane, external ear and ear canal normal. Tympanic membrane is not erythematous, not retracted and not bulging.  Nose: No mucosal edema or rhinorrhea. Right sinus exhibits no maxillary sinus tenderness and no frontal sinus tenderness. Left sinus exhibits no maxillary sinus tenderness and no frontal sinus tenderness.  Mouth/Throat: Uvula is midline, oropharynx is clear and moist and mucous membranes are normal.  Eyes: Conjunctivae, EOM and lids are normal. Pupils are equal, round, and reactive to light. Lids are  everted and swept, no foreign bodies found.  Neck: Trachea normal and normal range of motion. Neck supple. Carotid bruit is not present. No mass and no thyromegaly present.  Cardiovascular: Normal rate, regular rhythm, S1 normal, S2 normal, normal heart sounds, intact distal pulses and normal pulses.  Exam reveals no gallop and no friction rub.   No murmur heard. Pulmonary/Chest: Effort normal and breath sounds normal. Not tachypneic. No respiratory distress. She has  no decreased breath sounds. She has no wheezes. She has no rhonchi. She has no rales.  Abdominal: Soft. Normal appearance and bowel sounds are normal. There is no tenderness.  Musculoskeletal:  Focal swelling on dorsum of left foot, non tender.  Neurological: She is alert and oriented to person, place, and time.  Skin: Skin is warm, dry and intact. No rash noted.  Pink macules on cheeks and under nose, flaky, greasyt scale at nose and in eyebrows  Psychiatric: Her speech is normal and behavior is normal. Judgment and thought content normal. Her mood appears not anxious. Cognition and memory are normal. She does not exhibit a depressed mood.    Marland Kitchen dmfpoot       Assessment & Plan:

## 2013-03-07 NOTE — Progress Notes (Signed)
Pre-visit discussion using our clinic review tool. No additional management support is needed unless otherwise documented below in the visit note.  

## 2013-03-07 NOTE — Assessment & Plan Note (Signed)
Start nasal steroid spray. 

## 2013-03-13 ENCOUNTER — Other Ambulatory Visit: Payer: Self-pay

## 2013-03-13 MED ORDER — RAMIPRIL 5 MG PO CAPS: ORAL_CAPSULE | ORAL | Status: DC

## 2013-03-25 ENCOUNTER — Encounter: Payer: Self-pay | Admitting: Cardiovascular Disease

## 2013-03-25 ENCOUNTER — Ambulatory Visit (INDEPENDENT_AMBULATORY_CARE_PROVIDER_SITE_OTHER): Payer: Medicare Other | Admitting: Cardiovascular Disease

## 2013-03-25 VITALS — BP 144/80 | HR 72 | Ht 64.0 in | Wt 150.0 lb

## 2013-03-25 DIAGNOSIS — R55 Syncope and collapse: Secondary | ICD-10-CM | POA: Diagnosis not present

## 2013-03-25 DIAGNOSIS — R0789 Other chest pain: Secondary | ICD-10-CM

## 2013-03-25 DIAGNOSIS — R079 Chest pain, unspecified: Secondary | ICD-10-CM | POA: Diagnosis not present

## 2013-03-25 NOTE — Assessment & Plan Note (Signed)
She continues to report episodes of dizziness but no syncope or presyncope. I advised her to stay well hydrated and avoid sudden standing up.

## 2013-03-25 NOTE — Assessment & Plan Note (Signed)
The patient had a recent episode of exertional chest pain. EKG does not show any new changes. She appears to be very anxious about this. I recommend further evaluation with a pharmacologic nuclear stress test to rule out obstructive coronary artery disease. She is not able to exercise on a treadmill.

## 2013-03-25 NOTE — Patient Instructions (Signed)
Your physician has requested that you have a lexiscan myoview. For further information please visit [email or web address]. Please follow instruction sheet, as given.  Your physician recommends that you continue on your current medications as directed. Please refer to the Current Medication list given to you today.  Your physician recommends that you schedule a follow-up appointment as needed.   

## 2013-03-25 NOTE — Progress Notes (Signed)
HPI  [name] is an 79 year old female who is here today for chest pain. She was seen last year by Dr. wall for presyncope thought to be due to orthostatic hypotension. She has chronic medical conditions that include hypertension and hyperlipidemia. She lives by herself and overall is independent with activities of daily living. Recently, she went to get her newspaper and suddenly had chest discomfort under the left breast area which was described as aching sensation with no radiation. It lasted for about 5 minutes and resolved with rest. She began concern on cut down her activities but had no recurrent episodes. She has mild exertional dyspnea with no recent worsening. She continues to have episodes of dizziness both with standing up as well as other times. She had no syncopal episodes.    Allergies  Allergen Reactions  . Atorvastatin     REACTION: intolerant  . Codeine     REACTION: GI upset  . Rosuvastatin     REACTION: intolerant     Current Outpatient Prescriptions on File Prior to Visit  Medication Sig Dispense Refill  . aspirin 325 MG tablet Take 162 mg by mouth daily.       . Calcium Citrate (CITRACAL PO) Take by mouth daily.      . fish oil-omega-3 fatty acids 1000 MG capsule Take 1 g by mouth daily.        . fluticasone (FLONASE) 50 MCG/ACT nasal spray Place 2 sprays into both nostrils daily.  16 g  6  . ketoconazole (NIZORAL) 2 % cream Apply topically daily.  30 g  0  . Magnesium Hydroxide (PHILLIPS MILK OF MAGNESIA PO) Take 1 tablet by mouth daily. 3-4 times per week       . Multiple Vitamin (MULTIVITAMIN) tablet Take 1 tablet by mouth daily.        . ramipril (ALTACE) 5 MG capsule TAKE ONE CAPSULE BY MOUTH EVERY DAY  30 capsule  1  . simvastatin (ZOCOR) 40 MG tablet TAKE 1 TABLET BY MOUTH AT BEDTIME  30 tablet  1   No current facility-administered medications on file prior to visit.     Past Medical History  Diagnosis Date  . Hyperlipidemia   . Hypertension     . Diabetes mellitus     Type II  . GERD (gastroesophageal reflux disease)   . Allergy   . Syncope and collapse     1 episode  . Orthostatic hypotension 04/2006    [facility]ospital  . Palpitations   . Acute pharyngitis   . Other abscess of vulva   . Acute sinusitis, unspecified   . Acute upper respiratory infections of unspecified site   . Hemangioma of unspecified site   . Anal and rectal polyp   . Cataract     History of  . Osteopenia   . Osteoarthritis   . Gout   . Cavernous angioma   . Degenerative cervical disc 02/2006     Past Surgical History  Procedure Laterality Date  . Abdominal hysterectomy    . Cataract extraction    . Cavernous hemangioma  04/2004    Hospital  . Cavernous angioma    . Aortic dopplers      Mild plaque.  EEG okay  . Total knee arthroplasty      Right     Family History  Problem Relation Age of Onset  . Cancer Maternal Aunt     Breast CA  . Heart disease Other  CHF     History   Social History  . Marital Status: Married    Spouse Name: N/A    Number of Children: 3  . Years of Education: N/A   Occupational History  . 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 Smoking status: Former Games developer  . Smokeless tobacco: Never Used     Comment: quit 1970  . Alcohol Use: Yes     Comment: wine occassionally  . Drug Use: No  . Sexual Activity: Not on file   Other Topics Concern  . Not on file   Social History Narrative   Diet: fruit and veggies, water, lean protein.   Activity limited secondary to knee discomfort/arthritis, but going to church exersice group two times a week.    Has living will, full code. HCPOA: Terressa Koyanagi (reviewed 2014)                  PHYSICAL EXAM   BP 144/80  Pulse 72  Ht 5\' 4"  (1.626 m)  Wt 150 lb (68.04 kg)  BMI 25.73 kg/m2 Constitutional: She is oriented to person, place, and time. She appears well-developed and well-nourished. No distress.   HENT: No nasal discharge.  Head: Normocephalic and atraumatic.  Eyes: Pupils are equal and round. No discharge.  Neck: Normal range of motion. Neck supple. No JVD present. No thyromegaly present.  Cardiovascular: Normal rate, regular rhythm, normal heart sounds. Exam reveals no gallop and no friction rub. No murmur heard.  Pulmonary/Chest: Effort normal and breath sounds normal. No stridor. No respiratory distress. She has no wheezes. She has no rales. She exhibits no tenderness.  Abdominal: Soft. Bowel sounds are normal. She exhibits no distension. There is no tenderness. There is no rebound and no guarding.  Musculoskeletal: Normal range of motion. She exhibits no edema and no tenderness.  Neurological: She is alert and oriented to person, place, and time. Coordination normal.  Skin: Skin is warm and dry. No rash noted. She is not diaphoretic. No erythema. No pallor.  Psychiatric: She has a normal mood and affect. Her behavior is normal. Judgment and thought content normal.     EKG: Normal sinus rhythm with poor R wave progression in the anterior leads. This is not a new finding.   ASSESSMENT AND PLAN

## 2013-03-28 ENCOUNTER — Telehealth: Payer: Self-pay | Admitting: Family Medicine

## 2013-03-28 NOTE — Telephone Encounter (Signed)
Patient Information:  Caller Name: Angelmarie  Phone: [phone]  Patient: [name]  Gender: Female  DOB: 13-Jun-1922  Age: 79 Years  PCP: Kerby Nora (Family Practice)  Office Follow Up:  Does the office need to follow up with this patient?: No  Instructions For The Office: N/A  RN Note:  Home care instructions and call back parameters reviewed. Understanding expressed. Encouraged to call back for questions, concerns or changes  Symptoms  Reason For Call & Symptoms: Patient states onset of cold s/sx on Wednesday 03/28/13.  +nasal congestion, +runny nose clear, +cough non productive, Afebrile.  Reviewed Health History In EMR: Yes  Reviewed Medications In EMR: Yes  Reviewed Allergies In EMR: Yes  Reviewed Surgeries / Procedures: Yes  Date of Onset of Symptoms: 03/26/2013  Treatments Tried: Allegra- 11:30 a.m.  Treatments Tried Worked: No  Guideline(s) Used:  Colds  Disposition Per Guideline:   Home Care  Reason For Disposition Reached:   Colds with no complications  Advice Given:  Reassurance  It sounds like an uncomplicated cold that we can treat at home.  Colds are very common and may make you feel uncomfortable.  For a Runny Nose With Profuse Discharge:   Nasal mucus and discharge helps to wash viruses and bacteria out of the nose and sinuses.  Blowing the nose is all that is needed.  For a Stuffy Nose - Use Nasal Washes:  Introduction: Saline (salt water) nasal irrigation (nasal wash) is an effective and simple home remedy for treating stuffy nose and sinus congestion. The nose can be irrigated by pouring, spraying, or squirting salt water into the nose and then letting it run back out.  How it Helps: The salt water rinses out excess mucus, washes out any irritants (dust, allergens) that might be present, and moistens the nasal cavity.  Humidifier:  If the air in your home is dry, use a cool-mist humidifier  Treatment for Associated Symptoms of Colds:  For  muscle aches, headaches, or moderate fever (more than 101 F or 38.9 C): Take acetaminophen every 4 hours.  Sore throat: Try throat lozenges, hard candy, or warm chicken broth.  Cough: Use cough drops.  Hydrate: Drink adequate liquids.  Call Back If:  Difficulty breathing occurs  Fever lasts more than 3 days  Nasal discharge lasts more than 10 days  Cough lasts more than 3 weeks  You become worse  Patient Will Follow Care Advice:  YES

## 2013-03-28 NOTE — Telephone Encounter (Signed)
Noted  

## 2013-03-28 NOTE — Telephone Encounter (Signed)
Agree with recs.

## 2013-03-31 ENCOUNTER — Other Ambulatory Visit: Payer: Self-pay | Admitting: Family Medicine

## 2013-04-05 ENCOUNTER — Encounter: Payer: Self-pay | Admitting: Internal Medicine

## 2013-04-05 ENCOUNTER — Ambulatory Visit (INDEPENDENT_AMBULATORY_CARE_PROVIDER_SITE_OTHER): Payer: Medicare Other | Admitting: Internal Medicine

## 2013-04-05 VITALS — BP 140/100 | HR 89 | Temp 98.3°F | Ht 65.5 in | Wt 147.0 lb

## 2013-04-05 DIAGNOSIS — R109 Unspecified abdominal pain: Secondary | ICD-10-CM | POA: Diagnosis not present

## 2013-04-05 NOTE — Progress Notes (Signed)
Pre-visit discussion using our clinic review tool. No additional management support is needed unless otherwise documented below in the visit note.  

## 2013-04-05 NOTE — Assessment & Plan Note (Signed)
Very vague Notes that she overate yesterday (high fat meal at her church) May just be vague indigestion---certainly not classic gallbladder symptoms Doesn't seem cardiac--has stress test next week due to other symptoms  Will observe for now since feels some better If recurrent symptoms, would check abdominal ultrasound

## 2013-04-05 NOTE — Progress Notes (Signed)
Subjective:    Patient ID: [name], female    DOB: 09-02-1922, 79 y.o.   MRN: [id number]  HPI Having some stomach trouble  Notes for 5-6 years she has sensation come up from trunk to head and almost makes her pass out Has had stress test for this next week This is different than today's symptoms  Has been getting over a cold Got up this morning and got paper Got her coffee---then started feeling "not right" in her stomach Worried about heart attack No nausea This is a new sensation  No nausea or vomiting Didn't eat for fear of trouble--also not much appetite 2 bowel movements this AM---seemed to clear No heartburn No trouble swallowing  Current Outpatient Prescriptions on File Prior to Visit  Medication Sig Dispense Refill  . aspirin 325 MG tablet Take 162 mg by mouth daily.       . Calcium Citrate (CITRACAL PO) Take by mouth daily.      . fish oil-omega-3 fatty acids 1000 MG capsule Take 1 g by mouth daily.        . fluticasone (FLONASE) 50 MCG/ACT nasal spray Place 2 sprays into both nostrils daily.  16 g  6  . ketoconazole (NIZORAL) 2 % cream Apply topically daily.  30 g  0  . Magnesium Hydroxide (PHILLIPS MILK OF MAGNESIA PO) Take 1 tablet by mouth daily. 3-4 times per week       . Multiple Vitamin (MULTIVITAMIN) tablet Take 1 tablet by mouth daily.        . ramipril (ALTACE) 5 MG capsule TAKE ONE CAPSULE BY MOUTH EVERY DAY  30 capsule  1  . simvastatin (ZOCOR) 40 MG tablet TAKE 1 TABLET BY MOUTH AT BEDTIME  30 tablet  5   No current facility-administered medications on file prior to visit.    Allergies  Allergen Reactions  . Atorvastatin     REACTION: intolerant  . Codeine     REACTION: GI upset  . Rosuvastatin     REACTION: intolerant    Past Medical History  Diagnosis Date  . Hyperlipidemia   . Hypertension   . Diabetes mellitus     Type II  . GERD (gastroesophageal reflux disease)   . Allergy   . Syncope and collapse     1 episode  .  Orthostatic hypotension 04/2006    Hospital  . Palpitations   . Acute pharyngitis   . Other abscess of vulva   . Acute sinusitis, unspecified   . Acute upper respiratory infections of unspecified site   . Hemangioma of unspecified site   . Anal and rectal polyp   . Cataract     History of  . Osteopenia   . Osteoarthritis   . Gout   . Cavernous angioma   . Degenerative cervical disc 02/2006    Past Surgical History  Procedure Laterality Date  . Abdominal hysterectomy    . Cataract extraction    . Cavernous hemangioma  04/2004    Hospital  . Cavernous angioma    . Aortic dopplers      Mild plaque.  EEG okay  . Total knee arthroplasty      Right    Family History  Problem Relation Age of Onset  . Cancer Maternal Aunt     Breast CA  . Heart disease Other     CHF    History   Social History  . Marital Status: Married    Spouse Name:  N/A    Number of Children: 3  . Years of Education: N/A   Occupational History  . 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 Smoking status: Former Games developer  . Smokeless tobacco: Never Used     Comment: quit 1970  . Alcohol Use: Yes     Comment: wine occassionally  . Drug Use: No  . Sexual Activity: Not on file   Other Topics Concern  . Not on file   Social History Narrative   Diet: fruit and veggies, water, lean protein.   Activity limited secondary to knee discomfort/arthritis, but going to church exersice group two times a week.    Has living will, full code. HCPOA: Terressa Koyanagi (reviewed 2014)               Review of Systems No fever No respiratory difficulty Slight cough---remnants of cold     Objective:   Physical Exam  Constitutional: She appears well-developed and well-nourished. No distress.  HENT:  Mouth/Throat: Oropharynx is clear and moist. No oropharyngeal exudate.  Neck: Normal range of motion. Neck supple. No thyromegaly present.  Cardiovascular: Normal  rate, regular rhythm and normal heart sounds.  Exam reveals no gallop.   No murmur heard. Pulmonary/Chest: Effort normal and breath sounds normal. No respiratory distress. She has no wheezes. She has no rales.  Abdominal: Soft. Bowel sounds are normal. She exhibits no distension. There is no rebound and no guarding.  Mild tenderness with RUQ palpation---referred to umbilical area Murphy's sign is absent though  Musculoskeletal: She exhibits no edema and no tenderness.  Lymphadenopathy:    She has no cervical adenopathy.          Assessment & Plan:

## 2013-04-10 ENCOUNTER — Encounter (HOSPITAL_COMMUNITY): Payer: Self-pay

## 2013-04-10 ENCOUNTER — Encounter (HOSPITAL_COMMUNITY): Payer: Self-pay | Admitting: Emergency Medicine

## 2013-04-10 ENCOUNTER — Other Ambulatory Visit: Payer: Self-pay

## 2013-04-10 ENCOUNTER — Emergency Department (HOSPITAL_COMMUNITY)
Admission: EM | Admit: 2013-04-10 | Discharge: 2013-04-10 | Disposition: A | Discharge status: Home / Self Care | Payer: Medicare Other | Attending: Emergency Medicine | Admitting: Emergency Medicine

## 2013-04-10 ENCOUNTER — Ambulatory Visit (HOSPITAL_BASED_OUTPATIENT_CLINIC_OR_DEPARTMENT_OTHER): Payer: Medicare Other | Admitting: Radiology

## 2013-04-10 ENCOUNTER — Encounter: Payer: Self-pay | Admitting: Cardiology

## 2013-04-10 VITALS — BP 164/91 | HR 72 | Ht 65.0 in | Wt 146.0 lb

## 2013-04-10 DIAGNOSIS — R55 Syncope and collapse: Secondary | ICD-10-CM | POA: Insufficient documentation

## 2013-04-10 DIAGNOSIS — R0602 Shortness of breath: Secondary | ICD-10-CM | POA: Diagnosis not present

## 2013-04-10 DIAGNOSIS — R5381 Other malaise: Secondary | ICD-10-CM | POA: Diagnosis not present

## 2013-04-10 DIAGNOSIS — E785 Hyperlipidemia, unspecified: Secondary | ICD-10-CM | POA: Insufficient documentation

## 2013-04-10 DIAGNOSIS — M549 Dorsalgia, unspecified: Secondary | ICD-10-CM

## 2013-04-10 DIAGNOSIS — R079 Chest pain, unspecified: Secondary | ICD-10-CM | POA: Insufficient documentation

## 2013-04-10 DIAGNOSIS — T50905A Adverse effect of unspecified drugs, medicaments and biological substances, initial encounter: Secondary | ICD-10-CM

## 2013-04-10 DIAGNOSIS — Z8709 Personal history of other diseases of the respiratory system: Secondary | ICD-10-CM | POA: Insufficient documentation

## 2013-04-10 DIAGNOSIS — R404 Transient alteration of awareness: Secondary | ICD-10-CM | POA: Diagnosis not present

## 2013-04-10 DIAGNOSIS — M1990 Unspecified osteoarthritis, unspecified site: Secondary | ICD-10-CM | POA: Insufficient documentation

## 2013-04-10 DIAGNOSIS — I10 Essential (primary) hypertension: Secondary | ICD-10-CM | POA: Insufficient documentation

## 2013-04-10 DIAGNOSIS — Z8719 Personal history of other diseases of the digestive system: Secondary | ICD-10-CM | POA: Insufficient documentation

## 2013-04-10 DIAGNOSIS — IMO0002 Reserved for concepts with insufficient information to code with codable children: Secondary | ICD-10-CM | POA: Insufficient documentation

## 2013-04-10 DIAGNOSIS — R109 Unspecified abdominal pain: Secondary | ICD-10-CM | POA: Insufficient documentation

## 2013-04-10 DIAGNOSIS — T46905A Adverse effect of unspecified agents primarily affecting the cardiovascular system, initial encounter: Secondary | ICD-10-CM | POA: Insufficient documentation

## 2013-04-10 DIAGNOSIS — Z8669 Personal history of other diseases of the nervous system and sense organs: Secondary | ICD-10-CM | POA: Insufficient documentation

## 2013-04-10 DIAGNOSIS — Z79899 Other long term (current) drug therapy: Secondary | ICD-10-CM | POA: Insufficient documentation

## 2013-04-10 DIAGNOSIS — Z7982 Long term (current) use of aspirin: Secondary | ICD-10-CM | POA: Insufficient documentation

## 2013-04-10 DIAGNOSIS — Z8742 Personal history of other diseases of the female genital tract: Secondary | ICD-10-CM | POA: Insufficient documentation

## 2013-04-10 DIAGNOSIS — E119 Type 2 diabetes mellitus without complications: Secondary | ICD-10-CM | POA: Insufficient documentation

## 2013-04-10 DIAGNOSIS — Z87891 Personal history of nicotine dependence: Secondary | ICD-10-CM | POA: Insufficient documentation

## 2013-04-10 LAB — CBC WITH DIFFERENTIAL/PLATELET
Basophils Absolute: 0 10*3/uL (ref 0.0–0.1)
Basophils Relative: 0 % (ref 0–1)
Eosinophils Absolute: 0.1 10*3/uL (ref 0.0–0.7)
Eosinophils Relative: 1 % (ref 0–5)
HCT: 40.1 % (ref 36.0–46.0)
Lymphocytes Relative: 19 % (ref 12–46)
Lymphs Abs: 2 10*3/uL (ref 0.7–4.0)
MCH: 30.7 pg (ref 26.0–34.0)
MCHC: 33.2 g/dL (ref 30.0–36.0)
MCV: 92.6 fL (ref 78.0–100.0)
Monocytes Absolute: 0.8 10*3/uL (ref 0.1–1.0)
Neutrophils Relative %: 73 % (ref 43–77)
Platelets: 240 10*3/uL (ref 150–400)
RBC: 4.33 MIL/uL (ref 3.87–5.11)
RDW: 13.7 % (ref 11.5–15.5)
WBC: 10.6 10*3/uL — ABNORMAL HIGH (ref 4.0–10.5)

## 2013-04-10 LAB — BASIC METABOLIC PANEL
BUN: 20 mg/dL (ref 6–23)
CO2: 24 mEq/L (ref 19–32)
Calcium: 9.4 mg/dL (ref 8.4–10.5)
Creatinine, Ser: 1.05 mg/dL (ref 0.50–1.10)
GFR calc non Af Amer: 46 mL/min — ABNORMAL LOW (ref >90)
Sodium: 139 mEq/L (ref 135–145)

## 2013-04-10 LAB — GLUCOSE, CAPILLARY: Glucose-Capillary: 87 mg/dL (ref 70–99)

## 2013-04-10 MED ORDER — TECHNETIUM TC 99M SESTAMIBI GENERIC - CARDIOLITE
11.0000 | Freq: Once | INTRAVENOUS | Status: AC | PRN
  Administered 2013-04-10: 11 via INTRAVENOUS

## 2013-04-10 MED ORDER — TECHNETIUM TC 99M SESTAMIBI GENERIC - CARDIOLITE
33.0000 | Freq: Once | INTRAVENOUS | Status: AC | PRN
  Administered 2013-04-10: 33 via INTRAVENOUS

## 2013-04-10 MED ORDER — AMINOPHYLLINE 25 MG/ML IV SOLN
75.0000 mg | Freq: Once | INTRAVENOUS | Status: AC
  Administered 2013-04-10: 75 mg via INTRAVENOUS

## 2013-04-10 MED ORDER — REGADENOSON 0.4 MG/5ML IV SOLN
0.4000 mg | Freq: Once | INTRAVENOUS | Status: AC
  Administered 2013-04-10: 0.4 mg via INTRAVENOUS

## 2013-04-10 MED ORDER — SODIUM CHLORIDE 0.9 % IV BOLUS (SEPSIS)
500.0000 mL | Freq: Once | INTRAVENOUS | Status: AC
  Administered 2013-04-10: 500 mL via INTRAVENOUS

## 2013-04-10 NOTE — Progress Notes (Signed)
Patient ID: [name], female   DOB: Jun 19, 1922, 79 y.o.   MRN: [id number] [name] had a [employer] today. After the test was completed and the patient was at the elevator to leave, the patient complained of dizziness,abdominal pain,chest pain, SOB,and headache. BP 180/100 HR 80, 1 1/2 hr CBG was 203, O2 Sat 99%RA.  I discussed with Dr. [place] with an order given to monitor the patient longer and give a 2nd dose of aminophylline 75 mg IVP. BP 160/80 HR 88. The patient was taken to the office exam room via wheelchair, and helped to the exam table. IV restarted with 20G angiocath (R) AC x 1. Aminophylline 75 mg given IVP with quick improvement of symptoms. The patient to be discharged by the office staff.Irean Hong, RN.

## 2013-04-10 NOTE — ED Notes (Signed)
Pt had a stress test today and shortly after, had an episode of weakness, ABD Pain, and near syncope. When she went home, pt had similar episode and called EMS. EMS reports pt was NSR and BP 144/82, HR 70.

## 2013-04-10 NOTE — ED Provider Notes (Signed)
CSN: [id number]     Arrival date & time 04/10/13  1836 History   First MD Initiated Contact with Patient 04/10/13 1837     Chief Complaint  Patient presents with  . Weakness  . Near Syncope   (Consider location/radiation/quality/duration/timing/severity/associated sxs/prior Treatment) Patient is a 79 y.o. female presenting with near-syncope.  Near Syncope This is a new problem. Episode onset: a few hours ago. Episode frequency: twice. The problem has been resolved. Pertinent negatives include no chest pain, no abdominal pain, no headaches and no shortness of breath. Nothing aggravates the symptoms. The symptoms are relieved by lying down and rest. Treatments tried: aminophylline. The treatment provided moderate relief.    Past Medical History  Diagnosis Date  . Hyperlipidemia   . Hypertension   . Diabetes mellitus     Type II  . GERD (gastroesophageal reflux disease)   . Allergy   . Syncope and collapse     1 episode  . Orthostatic hypotension 04/2006    Hospital  . Palpitations   . Acute pharyngitis   . Other abscess of vulva   . Acute sinusitis, unspecified   . Acute upper respiratory infections of unspecified site   . Hemangioma of unspecified site   . Anal and rectal polyp   . Cataract     History of  . Osteopenia   . Osteoarthritis   . Gout   . Cavernous angioma   . Degenerative cervical disc 02/2006   Past Surgical History  Procedure Laterality Date  . Abdominal hysterectomy    . Cataract extraction    . Cavernous hemangioma  04/2004    Hospital  . Cavernous angioma    . Aortic dopplers      Mild plaque.  EEG okay  . Total knee arthroplasty      Right   Family History  Problem Relation Age of Onset  . Cancer Maternal Aunt     Breast CA  . Heart disease Other     CHF   History  Substance Use Topics  . Smoking status: Former Games developer  . Smokeless tobacco: Never Used     Comment: quit 1970  . Alcohol Use: Yes     Comment: wine occassionally   OB  History   Grav Para Term Preterm Abortions TAB SAB Ect Mult Living                 Review of Systems  Constitutional: Negative for fever.  HENT: Negative for congestion.   Respiratory: Negative for cough and shortness of breath.   Cardiovascular: Positive for near-syncope. Negative for chest pain.  Gastrointestinal: Negative for nausea, vomiting, abdominal pain and diarrhea.  Neurological: Negative for headaches.  All other systems reviewed and are negative.    Allergies  Atorvastatin; Codeine; and Rosuvastatin  Home Medications   Current Outpatient Rx  Name  Route  Sig  Dispense  Refill  . aspirin 325 MG tablet   Oral   Take 162 mg by mouth daily.          . Calcium Citrate (CITRACAL PO)   Oral   Take by mouth daily.         . fish oil-omega-3 fatty acids 1000 MG capsule   Oral   Take 1 g by mouth daily.           . fluticasone (FLONASE) 50 MCG/ACT nasal spray   Each Nare   Place 2 sprays into both nostrils daily.   16 g  6   . ketoconazole (NIZORAL) 2 % cream   Topical   Apply topically daily.   30 g   0   . Magnesium Hydroxide (PHILLIPS MILK OF MAGNESIA PO)   Oral   Take 1 tablet by mouth daily. 3-4 times per week          . Multiple Vitamin (MULTIVITAMIN) tablet   Oral   Take 1 tablet by mouth daily.           . ramipril (ALTACE) 5 MG capsule      TAKE ONE CAPSULE BY MOUTH EVERY DAY   30 capsule   1     .Marland KitchenPatient needs to contact office to schedule  App ...   . simvastatin (ZOCOR) 40 MG tablet      TAKE 1 TABLET BY MOUTH AT BEDTIME   30 tablet   5    BP 139/76  Pulse 68  Temp(Src) 98.3 F (36.8 C) (Oral)  Ht 5\' 4"  (1.626 m)  Wt 146 lb (66.225 kg)  BMI 25.05 kg/m2  SpO2 100% Physical Exam  Nursing note and vitals reviewed. Constitutional: She is oriented to person, place, and time. She appears well-developed and well-nourished. No distress.  Appears younger than stated age  HENT:  Head: Normocephalic and atraumatic.   Mouth/Throat: Oropharynx is clear and moist.  Eyes: Conjunctivae are normal. Pupils are equal, round, and reactive to light. No scleral icterus.  Neck: Neck supple.  Cardiovascular: Normal rate, regular rhythm, normal heart sounds and intact distal pulses.   No murmur heard. Pulmonary/Chest: Effort normal and breath sounds normal. No stridor. No respiratory distress. She has no rales.  Abdominal: Soft. Bowel sounds are normal. She exhibits no distension. There is no tenderness. There is no rebound and no guarding.  Musculoskeletal: Normal range of motion.  Neurological: She is alert and oriented to person, place, and time.  Skin: Skin is warm and dry. No rash noted.  Psychiatric: She has a normal mood and affect. Her behavior is normal.    ED Course  Procedures (including critical care time) Labs Review Labs Reviewed  CBC WITH DIFFERENTIAL - Abnormal; Notable for the following:    WBC 10.6 (*)    All other components within normal limits  BASIC METABOLIC PANEL - Abnormal; Notable for the following:    GFR calc non Af Amer 46 (*)    GFR calc Af Amer 53 (*)    All other components within normal limits  GLUCOSE, CAPILLARY  URINALYSIS, ROUTINE W REFLEX MICROSCOPIC   Imaging Review No results found.  EKG Interpretation   None     EKG - NSR, rate 71, LAD, normal intervals, nonspecific ST changes in lateral leads are similar to prior.    MDM   1. Near syncope   2. Medication adverse effect, initial encounter    79 yo female presenting after two episodes of near syncope in the setting of a recent stress test.  She had a [employer] stress performed with lexiscan earlier in the day.  After this test, she had an episode where she became acutely dizzy and felt that she would pass out.  She denies that she had chest pain, shortness of breath, nausea, or diaphoresis during this time.  She was given a dose of aminophylline and eventually her symptoms improved and she went home.  While at  home, she had another episode, although less severe.  She therefore presented to the ED.  She reports having some abdominal epigastric discomfort  for several days, but not worse during the episodes.  No pain or tenderness to palpation of her abdomen in ED.  She did not actually lose consciousness.  She reports feeling back to baseline   Lab tests are unremarkable.  I suspect that she has had a delayed adverse reaction to the lexiscan compounded with decreased PO intake throughout the day.  She feels much better and I think she is stable for outpatient management.  Return precautions given.   Candyce Churn, MD 04/10/13 2038

## 2013-04-10 NOTE — Progress Notes (Signed)
[facility] SITE 3 NUCLEAR MED [address] [place], [place] [address] [phone]    Cardiology Nuclear Med Study  [name] is a 79 y.o. female     MRN : [id number]     DOB: 03-08-1923  Procedure Date: 04/10/2013  Nuclear Med Background Indication for Stress Test:  Evaluation for Ischemia History:  No known CAD Cardiac Risk Factors: History of Smoking, Hypertension, Lipids and NIDDM  Symptoms:  Chest Pain with Exertion, DOE and Near Syncope   Nuclear Pre-Procedure Caffeine/Decaff Intake:  None NPO After: 7:00pm   Lungs:  clear O2 Sat: 98% on room air. IV 0.9% NS with Angio Cath:  22g  IV Site: R Hand  IV Started by:  Bonnita Levan, RN  Chest Size (in):  42 Cup Size: B  Height: 5\' 5"  (1.651 m)  Weight:  146 lb (66.225 kg)  BMI:  Body mass index is 24.3 kg/(m^2). Tech Comments:After test completed and the patient was at the elevator to leave, the patient complained of dizziness, abdominal pain,chest pain, SOB, and headache. I discussed with Dr. [place] with an order given to monitor the patient longer and give a 2nd dose of aminophylline 75 mg IVP. IV restarted with 20G angiocath (R) AC x 1. Aminophylline 75 mg given IVP with quick improvement of symptoms. Irean Hong, RN    Nuclear Med Study 1 or 2 day study: 1 day  Stress Test Type:  Eugenie Birks  Reading MD: Olga Millers, MD  Order Authorizing Provider:  Lorine Bears, MD  Resting Radionuclide: Technetium 54m Sestamibi  Resting Radionuclide Dose: 11.0 mCi   Stress Radionuclide:  Technetium 43m Sestamibi  Stress Radionuclide Dose: 33.0 mCi           Stress Protocol Rest HR: 72 Stress HR: 103  Rest BP: 164/91 Stress BP: 151/77  Exercise Time (min): n/a METS: n/a           Dose of Adenosine (mg):  n/a Dose of Lexiscan: 0.4 mg  Dose of Atropine (mg): n/a Dose of Dobutamine: n/a mcg/kg/min (at max HR)  Stress Test Technologist: Nelson Chimes, BS-ES  Nuclear Technologist:  Domenic Polite, CNMT      Rest Procedure:  Myocardial perfusion imaging was performed at rest 45 minutes following the intravenous administration of Technetium 63m Sestamibi. Rest ECG: NSR, anterior MI, nonspecific ST changes.  Stress Procedure:  The patient received IV Lexiscan 0.4 mg over 15-seconds.  Technetium 35m Sestamibi injected at 30-seconds.  Quantitative spect images were obtained after a 45 minute delay.  During the infusion of Lexiscan, the patient complained of being very lightheaded (felt she was going to pass out), legs, stomach and backside hurt.  75mg  aminophylline was given by Dario Guardian, CNMT. Patient began to feel better but not completely back to baseline.  Stress ECG: No significant ST segment change suggestive of ischemia.  QPS Raw Data Images:  Acquisition technically good; normal left ventricular size. Stress Images:  There is decreased uptake in the inferior wall. Rest Images:  There is decreased uptake in the inferior wall. Subtraction (SDS):  No evidence of ischemia. Transient Ischemic Dilatation (Normal <1.22):  0.89 Lung/Heart Ratio (Normal <0.45):  0.28  Quantitative Gated Spect Images QGS EDV:  63 ml QGS ESV:  18 ml  Impression Exercise Capacity:  Lexiscan with no exercise. BP Response:  Normal blood pressure response. Clinical Symptoms:  There is chest tightness and dyspnea following procedure. ECG Impression:  No significant ST segment change suggestive of ischemia. Comparison  with Prior Nuclear Study: No images to compare  Overall Impression:  Normal stress nuclear study with a small, mild, fixed inferobasal defect consistent with thinning; no ischemia.  LV Ejection Fraction: 71%.  LV Wall Motion:  NL LV Function; NL Wall Motion  [name]]

## 2013-04-11 ENCOUNTER — Telehealth: Payer: Self-pay

## 2013-04-11 NOTE — Telephone Encounter (Signed)
Pt called back her left lower side near belly button is bothering her;pt said is not hurting, but she feels like something is there but there is no lump or knot; last BM was this AM but BM was constipated, hard BM that was small amt. Pt said feels like acid taste in mouth and pt said she does not feel right. Today pt is not dizzy and does not feel "fainty" and no fever and no N&V. Pt wants to know when does Dr Ermalene Searing wants to recheck pt for f/u from ED. Pt request cb. If pt condition changes or worsens prior to cb pt will go to ED.

## 2013-04-11 NOTE — Telephone Encounter (Signed)
Spoke with Ms. [name].  States she is having some left side pain.  Has taken Maalox and drank about six glasses of water and is feeling a little bit better.  She states it is not a big pain but just feels like something is not right.  States she has been trying to rest a lot to day.  Appointment scheduled with Dr. Patsy Lager for 04/15/2013 @ 10:00am.  Patient advised if pain in abdomen does become severe before she gets in to see Dr. Patsy Lager on Tuesday, she will need to go to the ER.  Patient states understanding and is in agreement with plan.

## 2013-04-11 NOTE — Telephone Encounter (Signed)
We do need to have her follow up for re-eval.. No symptoms sound severe but she should have follow up at least by early next week with Dr. Patsy Lager since I am out of town.  If her pain in abdomen is severe she needs to got to ER.

## 2013-04-11 NOTE — Telephone Encounter (Signed)
Perfect

## 2013-04-11 NOTE — Telephone Encounter (Signed)
Pt left v/m; pt had stress test and reacted to medication given; pt was taken to ED for ? Syncope, elevated BP and elevated BS;BS was 203. Pt said she feels OK today but was to call for f/u appt. Left v/m for pt to cb.

## 2013-04-14 ENCOUNTER — Telehealth: Payer: Self-pay | Admitting: Family Medicine

## 2013-04-14 NOTE — Telephone Encounter (Signed)
Patient Information:  Caller Name: Eirene  Phone: [phone]  Patient: [name]  Gender: Female  DOB: December 21, 1922  Age: 79 Years  PCP: Kerby Nora (Family Practice)  Office Follow Up:  Does the office need to follow up with this patient?: No  Instructions For The Office: N/A  RN Note:  Care advice given and advised to FU as scheduled tomorrow to further discuss BS concerns.  Symptoms  Reason For Call & Symptoms: Today, 04/14/2013, RN returned call to pt who had called about her BS 245 at  1000 this  morning  after eating bowl cheerios  at 0800 .    At 1400   BS 117 , and then has  eaten soup and egg. Pt currently only watches her diet for her DM.  - pt already has appt with Dr. Patsy Lager tomorrow  for Stress test FU.  Reviewed Health History In EMR: Yes  Reviewed Medications In EMR: Yes  Reviewed Allergies In EMR: Yes  Reviewed Surgeries / Procedures: Yes  Date of Onset of Symptoms: 04/14/2013  Guideline(s) Used:  Diabetes - High Blood Sugar  Disposition Per Guideline:   Home Care  Reason For Disposition Reached:   Blood glucose 60-240 mg/dl (3.5 -13 mmol/l)  Advice Given:  Treatment - Liquids  Drink at least one glass (8 oz or 240 ml) of water per hour for the next 4 hours. (Reason: adequate hydration will reduce hyperglycemia).  Expected Course  Your blood sugar continues to get above 240 mg/dl (13 mmol/l).  Your blood sugar continues to be higher than your daily glucose goals (set by you and your doctor).  Call Back If:  Vomiting lasting more than 4 hours or unable to drink any liquids.  You become worse.  Liquids  Drink more fluids, at least 8-10 glasses daily (8 oz or 240 ml each glass).  Patient Will Follow Care Advice:  YES

## 2013-04-14 NOTE — Telephone Encounter (Signed)
Noted  

## 2013-04-15 ENCOUNTER — Ambulatory Visit (INDEPENDENT_AMBULATORY_CARE_PROVIDER_SITE_OTHER): Payer: Medicare Other | Admitting: Family Medicine

## 2013-04-15 ENCOUNTER — Encounter: Payer: Self-pay | Admitting: Family Medicine

## 2013-04-15 VITALS — BP 130/70 | HR 78 | Temp 97.9°F | Ht 65.5 in | Wt 145.0 lb

## 2013-04-15 DIAGNOSIS — R109 Unspecified abdominal pain: Secondary | ICD-10-CM | POA: Diagnosis not present

## 2013-04-15 DIAGNOSIS — E119 Type 2 diabetes mellitus without complications: Secondary | ICD-10-CM | POA: Diagnosis not present

## 2013-04-15 NOTE — Progress Notes (Signed)
Date:  04/15/2013   Name:  [name]   DOB:  03/07/1923   MRN:  [id number] Gender: female Age: 79 y.o.  Primary Physician:  Kerby Nora, MD   Chief Complaint: Left side discomfort   Subjective:   History of Present Illness:  [name] is a 79 y.o. pleasant patient who presents with the following:  dm Since christmas is coming but thought maybe  BS was 203 at the hospital.  244 at [facility].  Then at 12 it was 127.  Elderly patient who is a fairly poor historian comes in with multiple complaints today.  Initially she talked about several things,  questioning whether or not she needs to be on diabetic medication. Labs were reviewed, and her last A1c was a 6.9.  She also complains of some abdominal pain which upon chart review appears to have been relatively ongoing for some time. Is not having any black colored stools or bright red blood per rectum. No trauma, or other accident. She does think she may have been constipated recently.  Patient Active Problem List   Diagnosis Date Noted  . Abdominal discomfort 04/05/2013  . Atypical chest pain 03/25/2013  . Facial rash 03/07/2013  . Hematuria 10/21/2012  . Syncope, near 04/27/2011  . GOUT, UNSPECIFIED 12/13/2009  . OSTEOPOROSIS 10/29/2009  . PALPITATIONS, OCCASIONAL 07/07/2008  . ALLERGIC RHINITIS 06/02/2008  . HEMANGIOMA 07/30/2006  . DIABETES MELLITUS, TYPE II 07/30/2006  . HYPERLIPIDEMIA 07/30/2006  . HYPERTENSION 07/30/2006  . GERD 07/30/2006  . RECTAL POLYPS 07/30/2006  . OSTEOARTHRITIS 07/30/2006  . CATARACT, HX OF 07/30/2006    Past Medical History  Diagnosis Date  . Hyperlipidemia   . Hypertension   . Diabetes mellitus     Type II  . GERD (gastroesophageal reflux disease)   . Allergy   . Syncope and collapse     1 episode  . Orthostatic hypotension 04/2006    Hospital  . Palpitations   . Acute pharyngitis   . Other abscess of vulva   . Acute sinusitis, unspecified   . Acute upper  respiratory infections of unspecified site   . Hemangioma of unspecified site   . Anal and rectal polyp   . Cataract     History of  . Osteopenia   . Osteoarthritis   . Gout   . Cavernous angioma   . Degenerative cervical disc 02/2006    Past Surgical History  Procedure Laterality Date  . Abdominal hysterectomy    . Cataract extraction    . Cavernous hemangioma  04/2004    Hospital  . Cavernous angioma    . Aortic dopplers      Mild plaque.  EEG okay  . Total knee arthroplasty      Right    History   Social History  . Marital Status: Married    Spouse Name: N/A    Number of Children: 3  . Years of Education: N/A   Occupational History  . 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 Smoking status: Former Games developer  . Smokeless tobacco: Never Used     Comment: quit 1970  . Alcohol Use: Yes     Comment: wine occassionally  . Drug Use: No  . Sexual Activity: Not on file   Other Topics Concern  . Not on file   Social History Narrative   Diet: fruit and veggies, water, lean protein.   Activity limited secondary  to knee discomfort/arthritis, but going to church exersice group two times a week.    Has living will, full code. HCPOA: Terressa Koyanagi (reviewed 2014)                Family History  Problem Relation Age of Onset  . Cancer Maternal Aunt     Breast CA  . Heart disease Other     CHF    Allergies  Allergen Reactions  . Atorvastatin     REACTION: intolerant  . Codeine     REACTION: GI upset  . Rosuvastatin     REACTION: intolerant    Medication list has been reviewed and updated.  Review of Systems:   GEN: No acute illnesses, no fevers, chills. GI: No n/v/d, eating normally Pulm: No SOB Interactive and getting along well at home.  Otherwise, ROS is as per the HPI.  Objective:   Physical Examination: BP 130/70  Pulse 78  Temp(Src) 97.9 F (36.6 C) (Oral)  Ht 5' 5.5" (1.664 m)  Wt 145 lb  (65.772 kg)  BMI 23.75 kg/m2  Ideal Body Weight: Weight in (lb) to have BMI = 25: 152.2   GEN: WDWN, NAD, Non-toxic, A & O x 3 HEENT: Atraumatic, Normocephalic. Neck supple. No masses, No LAD. Ears and Nose: No external deformity. CV: RRR, No M/G/R. No JVD. No thrill. No extra heart sounds. PULM: CTA B, no wheezes, crackles, rhonchi. No retractions. No resp. distress. No accessory muscle use. ABD: S, mild relatively nonfocal tenderness, ND, + BS, No rebound, No HSM  EXTR: No c/c/e NEURO Normal gait.  PSYCH: Normally interactive. Conversant. Not depressed or anxious appearing.  Calm demeanor.   Laboratory and Imaging Data: Recent Results (from the past 2160 hour(s))  COMPREHENSIVE METABOLIC PANEL     Status: Abnormal   Collection Time    03/04/13  8:11 AM      Result Value Range   Sodium 139  135 - 145 mEq/L   Potassium 4.3  3.5 - 5.1 mEq/L   Chloride 108  96 - 112 mEq/L   CO2 27  19 - 32 mEq/L   Glucose, Bld 133 (*) 70 - 99 mg/dL   BUN 20  6 - 23 mg/dL   Creatinine, Ser 1.0  0.4 - 1.2 mg/dL   Total Bilirubin 0.7  0.3 - 1.2 mg/dL   Alkaline Phosphatase 43  39 - 117 U/L   AST 20  0 - 37 U/L   ALT 19  0 - 35 U/L   Total Protein 7.1  6.0 - 8.3 g/dL   Albumin 3.7  3.5 - 5.2 g/dL   Calcium 9.1  8.4 - 82.9 mg/dL   GFR 56.21 (*) >30.86 mL/min  LIPID PANEL     Status: None   Collection Time    03/04/13  8:11 AM      Result Value Range   Cholesterol 143  0 - 200 mg/dL   Comment: ATP III Classification       Desirable:  < 200 mg/dL               Borderline High:  200 - 239 mg/dL          High:  > = 578 mg/dL   Triglycerides 46.9  0.0 - 149.0 mg/dL   Comment: Normal:  <629 mg/dLBorderline High:  150 - 199 mg/dL   HDL 52.84  >13.24 mg/dL   VLDL 40.1  0.0 - 02.7 mg/dL   LDL Cholesterol 72  0 - 99 mg/dL   Total CHOL/HDL Ratio 3     Comment:                Men          Women1/2 Average Risk     3.4          3.3Average Risk          5.0          4.42X Average Risk          9.6           7.13X Average Risk          15.0          11.0                      HEMOGLOBIN A1C     Status: Abnormal   Collection Time    03/04/13  8:11 AM      Result Value Range   Hemoglobin A1C 6.9 (*) 4.6 - 6.5 %   Comment: Glycemic Control Guidelines for People with Diabetes:Non Diabetic:  <6%Goal of Therapy: <7%Additional Action Suggested:  >8%   GLUCOSE, CAPILLARY     Status: None   Collection Time    04/10/13  7:14 PM      Result Value Range   Glucose-Capillary 87  70 - 99 mg/dL   Comment 1 Documented in Chart     Comment 2 Notify RN    CBC WITH DIFFERENTIAL     Status: Abnormal   Collection Time    04/10/13  7:51 PM      Result Value Range   WBC 10.6 (*) 4.0 - 10.5 K/uL   RBC 4.33  3.87 - 5.11 MIL/uL   Hemoglobin 13.3  12.0 - 15.0 g/dL   HCT 95.6  21.3 - 08.6 %   MCV 92.6  78.0 - 100.0 fL   MCH 30.7  26.0 - 34.0 pg   MCHC 33.2  30.0 - 36.0 g/dL   RDW 57.8  46.9 - 62.9 %   Platelets 240  150 - 400 K/uL   Neutrophils Relative % 73  43 - 77 %   Neutro Abs 7.7  1.7 - 7.7 K/uL   Lymphocytes Relative 19  12 - 46 %   Lymphs Abs 2.0  0.7 - 4.0 K/uL   Monocytes Relative 7  3 - 12 %   Monocytes Absolute 0.8  0.1 - 1.0 K/uL   Eosinophils Relative 1  0 - 5 %   Eosinophils Absolute 0.1  0.0 - 0.7 K/uL   Basophils Relative 0  0 - 1 %   Basophils Absolute 0.0  0.0 - 0.1 K/uL  BASIC METABOLIC PANEL     Status: Abnormal   Collection Time    04/10/13  7:51 PM      Result Value Range   Sodium 139  135 - 145 mEq/L   Potassium 4.0  3.5 - 5.1 mEq/L   Chloride 104  96 - 112 mEq/L   CO2 24  19 - 32 mEq/L   Glucose, Bld 93  70 - 99 mg/dL   BUN 20  6 - 23 mg/dL   Creatinine, Ser 5.28  0.50 - 1.10 mg/dL   Calcium 9.4  8.4 - 41.3 mg/dL   GFR calc non Af Amer 46 (*) >90 mL/min   GFR calc Af Amer 53 (*) >90 mL/min   Comment: (NOTE)  The eGFR has been calculated using the CKD EPI equation.     This calculation has not been validated in all clinical situations.     eGFR's persistently <90  mL/min signify possible Chronic Kidney     Disease.   No results found.   Assessment & Plan:    Abdominal pain, unspecified site - Plan: US Abdomen Complete  DIABETES MELLITUS, TYPE II  >25 minutes spent in face to face time with patient, >50% spent in counselling or coordination of care   Relatively vague abdominal history. Recommended basic constipation care, and hopefully this will be all that is. I think it is also reasonable and a case where the historian is quite poor at least get ultrasound to evaluate for any large scale masses. I would prefer to avoid contrast in this case if at all possible.  I did my best to reassure her about her diabetes. I would be quite concerned about hypoglycemia more than mildly elevated blood sugars.  Patient Instructions   2. Prevention: drink 8 glasses water daily, FIBER (raw fruit, veggies, bran cereal, whole grains), regular exercise 3. Bulk formers like Metamucil (psyllium), Citrucel (methylcellulose) usually help 4. Stool softeners (Docusate) occaisionally OK 5. Occaisional over the counter Miralax usually safe  REFERRAL: GO THE THE FRONT ROOM AT THE ENTRANCE OF OUR CLINIC, NEAR CHECK IN. ASK FOR MARION. SHE WILL HELP YOU SET UP YOUR REFERRAL. DATE: TIME:    Orders Placed This Encounter  Procedures  . US Abdomen Complete    New medications, updates to list, dose adjustments: No orders of the defined types were placed in this encounter.    Signed,  Elpidio Galea. Reiko Vinje, MD, CAQ Sports Medicine  [facility] [address] [place] [address] Phone: [phone] Fax: [phone]    Medication List       This list is accurate as of: 04/15/13 11:59 PM.  Always use your most recent med list.               alendronate 70 MG tablet  Commonly known as:  FOSAMAX  Take 70 mg by mouth once a week.     aspirin 325 MG tablet  Take 162 mg by mouth daily.     CITRACAL PO  Take 1 capsule by mouth  daily.     fish oil-omega-3 fatty acids 1000 MG capsule  Take 1 g by mouth daily.     fluticasone 50 MCG/ACT nasal spray  Commonly known as:  FLONASE  Place 2 sprays into both nostrils daily.     ketoconazole 2 % cream  Commonly known as:  NIZORAL  Apply topically daily.     multivitamin tablet  Take 1 tablet by mouth daily.     PHILLIPS MILK OF MAGNESIA PO  Take 1 tablet by mouth daily. 3-4 times per week     ramipril 5 MG capsule  Commonly known as:  ALTACE  Take 5 mg by mouth daily.     simvastatin 40 MG tablet  Commonly known as:  ZOCOR  Take 40 mg by mouth at bedtime.

## 2013-04-15 NOTE — Progress Notes (Signed)
Pre-visit discussion using our clinic review tool. No additional management support is needed unless otherwise documented below in the visit note.  

## 2013-04-15 NOTE — Patient Instructions (Signed)
  2. Prevention: drink 8 glasses water daily, FIBER (raw fruit, veggies, bran cereal, whole grains), regular exercise 3. Bulk formers like Metamucil (psyllium), Citrucel (methylcellulose) usually help 4. Stool softeners (Docusate) occaisionally OK 5. Occaisional over the counter Miralax usually safe  REFERRAL: GO THE THE FRONT ROOM AT THE ENTRANCE OF OUR CLINIC, NEAR CHECK IN. ASK FOR MARION. SHE WILL HELP YOU SET UP YOUR REFERRAL. DATE: TIME:

## 2013-04-22 ENCOUNTER — Ambulatory Visit
Admission: RE | Admit: 2013-04-22 | Discharge: 2013-04-22 | Disposition: A | Discharge status: Home / Self Care | Payer: Medicare Other | Source: Ambulatory Visit | Attending: Family Medicine | Admitting: Family Medicine

## 2013-04-22 DIAGNOSIS — R109 Unspecified abdominal pain: Secondary | ICD-10-CM

## 2013-04-22 DIAGNOSIS — N1330 Unspecified hydronephrosis: Secondary | ICD-10-CM | POA: Diagnosis not present

## 2013-05-06 ENCOUNTER — Ambulatory Visit (INDEPENDENT_AMBULATORY_CARE_PROVIDER_SITE_OTHER): Payer: Medicare Other | Admitting: Family Medicine

## 2013-05-06 ENCOUNTER — Encounter: Payer: Self-pay | Admitting: Family Medicine

## 2013-05-06 ENCOUNTER — Telehealth: Payer: Self-pay | Admitting: Family Medicine

## 2013-05-06 VITALS — BP 120/70 | HR 75 | Temp 97.8°F | Ht 65.5 in | Wt 147.5 lb

## 2013-05-06 DIAGNOSIS — M25532 Pain in left wrist: Secondary | ICD-10-CM | POA: Insufficient documentation

## 2013-05-06 DIAGNOSIS — M25539 Pain in unspecified wrist: Secondary | ICD-10-CM | POA: Diagnosis not present

## 2013-05-06 LAB — URIC ACID: URIC ACID, SERUM: 5.1 mg/dL (ref 2.4–7.0)

## 2013-05-06 NOTE — Assessment & Plan Note (Signed)
Most likely gout given improvement with NSAID, as well as appearance. Doubt fracture, possible tendonitits or cellulitis but less likely.  Eval uric acid today.. If elevated treat with colchicine.  For now treat with tylenol or small dose of aleve given pain practically resolved. Watch redness  For spreading.

## 2013-05-06 NOTE — Progress Notes (Signed)
Pre-visit discussion using our clinic review tool. No additional management support is needed unless otherwise documented below in the visit note.  

## 2013-05-06 NOTE — Addendum Note (Signed)
Addended by: Ellamae Sia on: 05/06/2013 11:17 AM   Modules accepted: Orders

## 2013-05-06 NOTE — Patient Instructions (Signed)
Use tyelnol for pain or limited dose of aleve today. Stop at lab on your way out. I will call with results by the end of the day.

## 2013-05-06 NOTE — Progress Notes (Signed)
   Subjective:    Patient ID: [name], female    DOB: 03-01-1923, 80 y.o.   MRN: [id number]  HPI 80 year old female with history of gout, OA and diabetes presents with left wrist pain, redness, heat and swelling in 24 hours. Pain with bending. (Pain was severe)  She used heat, drank a lot of water, used aleve 1 tab.  No falls, few hours proceeding pain she did pick up heavy garbage bag (no sharp pull or pain at the time). Today redness is still there, mild swelling, only slightly painful.  She did have shrimp and asparagus the weekend prior.  She has had gout in thumbs in past... No flares in last 5-6 flares. On no preventative.    Review of Systems  Constitutional: Negative for fever and fatigue.  HENT: Negative for ear pain.   Eyes: Negative for pain.  Respiratory: Negative for chest tightness and shortness of breath.   Cardiovascular: Negative for chest pain, palpitations and leg swelling.  Gastrointestinal: Negative for abdominal pain.  Genitourinary: Negative for dysuria.       Objective:   Physical Exam  Constitutional: Vital signs are normal. She appears well-developed and well-nourished. She is cooperative.  Non-toxic appearance. She does not appear ill. No distress.  HENT:  Head: Normocephalic.  Right Ear: Hearing, tympanic membrane, external ear and ear canal normal. Tympanic membrane is not erythematous, not retracted and not bulging.  Left Ear: Hearing, tympanic membrane, external ear and ear canal normal. Tympanic membrane is not erythematous, not retracted and not bulging.  Nose: No mucosal edema or rhinorrhea. Right sinus exhibits no maxillary sinus tenderness and no frontal sinus tenderness. Left sinus exhibits no maxillary sinus tenderness and no frontal sinus tenderness.  Mouth/Throat: Uvula is midline, oropharynx is clear and moist and mucous membranes are normal.  Eyes: Conjunctivae, EOM and lids are normal. Pupils are equal, round, and reactive to  light. Lids are everted and swept, no foreign bodies found.  Neck: Trachea normal and normal range of motion. Neck supple. Carotid bruit is not present. No mass and no thyromegaly present.  Cardiovascular: Normal rate, regular rhythm, S1 normal, S2 normal, normal heart sounds, intact distal pulses and normal pulses.  Exam reveals no gallop and no friction rub.   No murmur heard. Pulmonary/Chest: Effort normal and breath sounds normal. Not tachypneic. No respiratory distress. She has no decreased breath sounds. She has no wheezes. She has no rhonchi. She has no rales.  Abdominal: Soft. Normal appearance and bowel sounds are normal. There is no tenderness.  Musculoskeletal:       Left wrist: She exhibits bony tenderness and swelling. She exhibits normal range of motion.  Mild swelling warmth and redness sin left dorsal and medial wrist... Drew line around red area. Minimal pain to palpation over dorsal wrist  Neurological: She is alert.  Skin: Skin is warm, dry and intact. No rash noted.  Psychiatric: Her speech is normal and behavior is normal. Judgment and thought content normal. Her mood appears not anxious. Cognition and memory are normal. She does not exhibit a depressed mood.          Assessment & Plan:

## 2013-05-06 NOTE — Telephone Encounter (Signed)
[facility] Triage Call Report Triage Record Num: [id number] Operator: Thereasa Parkin Patient Name: [name] Call Date & Time: 05/05/2013 5:17:31PM Patient Phone: [phone] PCP: Eliezer Lofts Patient Gender: Female PCP Fax : [phone] Patient DOB: [birth year] Practice Name: [facility] Reason for Call: Caller: [name]/Patient; PCP: Eliezer Lofts (Family Practice); CB#: [phone]; Call regarding Gout symptoms in left wrist; Onset: 05/05/13. Afebrile. Has not been testing blood sugars. Ate shrimp 05/03/13. Denies redness of skin or edema. Painful to twist wrist. Wrist feels warm to touch. No known injury. Reports lifted clothing and heavy trash. Advised to see MD within 24 hours for new signs or symptoms of local infection per Wrist Non-Injury. Already has appointment for 1045 05/06/13 with Dr Diona Browner. Protocol(s) Used: Wrist Non-Injury Recommended Outcome per Protocol: See Provider within 24 hours Reason for Outcome: New signs and symptoms of local infection Care Advice: ~ Elevate part to improve circulation and decrease discomfort. ~ SYMPTOM / CONDITION MANAGEMENT ~ CAUTIONS Analgesic/Antipyretic Advice - Acetaminophen: Consider acetaminophen as directed on label or by pharmacist/provider for pain or fever PRECAUTIONS: - Use if there is no history of liver disease, alcoholism, or intake of three or more alcohol drinks per day - Only if approved by provider during pregnancy or when breastfeeding - During pregnancy, acetaminophen should not be taken more than 3 consecutive days without telling provider - Do not exceed recommended dose or frequency ~ Apply local moist heat (such as a warm, wet wash cloth covered with plastic wrap) to the area for 15-20 minutes every 2-3 hours while awake. ~ Analgesic/Antipyretic Advice - NSAIDs: Consider aspirin, ibuprofen, naproxen or ketoprofen for pain or fever as directed on label or by pharmacist/provider. PRECAUTIONS: - If over  36 years of age, should not take longer than 1 week without consulting provider. EXCEPTIONS: - Should not be used if taking blood thinners or have bleeding problems. - Do not use if have history of sensitivity/allergy to any of these medications; or history of cardiovascular, ulcer, kidney, liver disease or diabetes unless approved by provider. - Do not exceed recommended dose or frequency. ~ See a provider within 4 hours if affected area increases in size quickly, red streaks extend from area, or area becomes blistered. ~ 05/05/2013 5:36:56PM Page 1 of 1 [id number]

## 2013-06-05 ENCOUNTER — Telehealth: Payer: Self-pay | Admitting: Family Medicine

## 2013-06-05 NOTE — Telephone Encounter (Signed)
Patient Information:  Caller Name: [name]  Phone: [phone]  Patient: [name]  Gender: Female  DOB: 16-Dec-1922  Age: 80 Years  PCP: Eliezer Lofts (Family Practice)  Office Follow Up:  Does the office need to follow up with this patient?: No  Instructions For The Office: N/A  RN Note:  Onset of cough 06/01/13.  States she took Human resources officer for the cough, but the pharmacist told her it might not be helpful, so she took coricidin.  States cough is keeping her from sleeping well.  Afebrile.  Per cough protocol, emergent symptoms deni4ed; advised appt within 24 hours due to cough interfering with sleep.  States has appt scheduled 06/06/13 1430 with Dr. Diona Browner.  krs/can  Symptoms  Reason For Call & Symptoms: cough  Reviewed Health History In EMR: Yes  Reviewed Medications In EMR: Yes  Reviewed Allergies In EMR: Yes  Reviewed Surgeries / Procedures: Yes  Date of Onset of Symptoms: 06/01/2013  Guideline(s) Used:  Cough  Disposition Per Guideline:   See Today or Tomorrow in Office  Reason For Disposition Reached:   Continuous (nonstop) coughing interferes with work or school and no improvement using cough treatment per Care Advice  Advice Given:  N/A  Patient Will Follow Care Advice:  YES  Appointment Scheduled:  06/06/2013 14:30:00 Appointment Scheduled Provider:  Eliezer Lofts (Family Practice)

## 2013-06-05 NOTE — Telephone Encounter (Signed)
Agreed, given age needs appt to be seen.

## 2013-06-06 ENCOUNTER — Encounter: Payer: Self-pay | Admitting: Family Medicine

## 2013-06-06 ENCOUNTER — Ambulatory Visit (INDEPENDENT_AMBULATORY_CARE_PROVIDER_SITE_OTHER): Payer: Medicare Other | Admitting: Family Medicine

## 2013-06-06 VITALS — BP 124/60 | HR 87 | Temp 98.3°F | Ht 65.5 in | Wt 148.5 lb

## 2013-06-06 DIAGNOSIS — J209 Acute bronchitis, unspecified: Secondary | ICD-10-CM | POA: Diagnosis not present

## 2013-06-06 DIAGNOSIS — B9689 Other specified bacterial agents as the cause of diseases classified elsewhere: Secondary | ICD-10-CM

## 2013-06-06 DIAGNOSIS — A499 Bacterial infection, unspecified: Secondary | ICD-10-CM | POA: Diagnosis not present

## 2013-06-06 DIAGNOSIS — J208 Acute bronchitis due to other specified organisms: Principal | ICD-10-CM

## 2013-06-06 MED ORDER — AZITHROMYCIN 250 MG PO TABS: ORAL_TABLET | ORAL | Status: DC

## 2013-06-06 NOTE — Progress Notes (Signed)
Pre-visit discussion using our clinic review tool. No additional management support is needed unless otherwise documented below in the visit note.  

## 2013-06-06 NOTE — Progress Notes (Signed)
   Subjective:    Patient ID: [name], female    DOB: Apr 13, 1923, 80 y.o.   MRN: [id number]  Cough This is a new problem. The current episode started 1 to 4 weeks ago. The problem has been gradually worsening. The problem occurs constantly. The cough is productive of sputum. Associated symptoms include nasal congestion, rhinorrhea and a sore throat. Pertinent negatives include no chills, ear pain, fever, headaches, myalgias, postnasal drip, rash, shortness of breath or wheezing. Associated symptoms comments: Fatigue  good appetite Some sinus pressure at times. Risk factors for lung disease include smoking/tobacco exposure (15 pack history of smoking remotely.). Treatments tried: coricidin, cough drops, allegra. The treatment provided mild relief. Her past medical history is significant for environmental allergies. There is no history of asthma, bronchiectasis, bronchitis, emphysema or pneumonia.      Review of Systems  Constitutional: Negative for fever and chills.  HENT: Positive for rhinorrhea and sore throat. Negative for ear pain and postnasal drip.   Respiratory: Positive for cough. Negative for shortness of breath and wheezing.   Musculoskeletal: Negative for myalgias.  Skin: Negative for rash.  Allergic/Immunologic: Positive for environmental allergies.  Neurological: Negative for headaches.       Objective:   Physical Exam  Constitutional: Vital signs are normal. She appears well-developed and well-nourished. She is cooperative.  Non-toxic appearance. She does not appear ill. No distress.  HENT:  Head: Normocephalic.  Right Ear: Hearing, tympanic membrane, external ear and ear canal normal. Tympanic membrane is not erythematous, not retracted and not bulging.  Left Ear: Hearing, tympanic membrane, external ear and ear canal normal. Tympanic membrane is not erythematous, not retracted and not bulging.  Nose: Mucosal edema and rhinorrhea present. Right sinus exhibits no  maxillary sinus tenderness and no frontal sinus tenderness. Left sinus exhibits no maxillary sinus tenderness and no frontal sinus tenderness.  Mouth/Throat: Uvula is midline, oropharynx is clear and moist and mucous membranes are normal.  Eyes: Conjunctivae, EOM and lids are normal. Pupils are equal, round, and reactive to light. Lids are everted and swept, no foreign bodies found.  Neck: Trachea normal and normal range of motion. Neck supple. Carotid bruit is not present. No mass and no thyromegaly present.  Cardiovascular: Normal rate, regular rhythm, S1 normal, S2 normal, normal heart sounds, intact distal pulses and normal pulses.  Exam reveals no gallop and no friction rub.   No murmur heard. Pulmonary/Chest: Effort normal and breath sounds normal. Not tachypneic. No respiratory distress. She has no decreased breath sounds. She has no wheezes. She has no rhonchi. She has no rales.  Neurological: She is alert.  Skin: Skin is warm, dry and intact. No rash noted.  Psychiatric: Her speech is normal and behavior is normal. Judgment normal. Her mood appears not anxious. Cognition and memory are normal. She does not exhibit a depressed mood.          Assessment & Plan:

## 2013-06-06 NOTE — Patient Instructions (Addendum)
Fill antibiotics. Can try  mucinex (plain) twice daily in place coricidin. Push fluids, and rest. If not improving  In 48-72 hours,call. Go to ER for severe shortness of breath.

## 2013-06-06 NOTE — Assessment & Plan Note (Signed)
Ongoing x 2 weeks, elderly female .Marland Kitchen Cover with antibiotics.

## 2013-06-09 ENCOUNTER — Telehealth: Payer: Self-pay | Admitting: Family Medicine

## 2013-06-09 DIAGNOSIS — E119 Type 2 diabetes mellitus without complications: Secondary | ICD-10-CM

## 2013-06-09 MED ORDER — GLUCOSE BLOOD VI STRP: ORAL_STRIP | Status: DC

## 2013-06-09 NOTE — Telephone Encounter (Signed)
Have her hols azithromycin... If symptoms are not continuing to improve we will change to a different medication.

## 2013-06-09 NOTE — Telephone Encounter (Signed)
Patient Information:  Caller Name: Charlette  Phone: [phone]  Patient: [name]  Gender: Female  DOB: 01/06/1923  Age: 80 Years  PCP: Eliezer Lofts (Family Practice)  Office Follow Up:  Does the office need to follow up with this patient?: Yes  Instructions For The Office: Needing refill on Freestyle Blood Glucose Test Strips  RN Note:  She needs an order sent to [facility] on [address] [place] for [facility] Blood Glucose Test Strips. She only has 15 left and she is going to be checking sugar more frequently over next few days.  Symptoms  Reason For Call & Symptoms: Seen in the office on 06/05/13 and prescribed antibiotic- Azithromycin took 2 on 06/06/13 and took one on 06/07/13 at 6pm with dinner. Woke up on 06/08/13 and felt nauseous and shaky and has not taken anymore since. Today BG =178 after breakfast/ recheck=198 @ 10:30.  Throat is scratchy. No cough.   Reviewed Health History In EMR: Yes  Reviewed Medications In EMR: Yes  Reviewed Allergies In EMR: Yes  Reviewed Surgeries / Procedures: Yes  Date of Onset of Symptoms: 06/08/2013  Guideline(s) Used:  Diabetes - High Blood Sugar  Disposition Per Guideline:   Home Care  Reason For Disposition Reached:   Blood glucose 60-240 mg/dl (3.5 -13 mmol/l)  Advice Given:  General  Definition of hyperglycemia: - Fasting blood glucose more than 140 mg/dL (7.5 mmol/l) or random blood glucose more than 200 mg/dL (11 mmol/l).  Treatment - Liquids  Drink at least one glass (8 oz or 240 ml) of water per hour for the next 4 hours. (Reason: adequate hydration will reduce hyperglycemia).  Generally, you should try to drink 6-8 glasses of water each day.  Measure and Record Your Blood Glucose  Every day you should measure your blood glucose before breakfast and before going to bed.  Record the results and show them to your doctor at your next office visit.  Daily Blood Glucose Goals  Pre-prandial (before meal): 70-130 mg/dL (3.9-7.2  mmol/l)  Post-prandial (2-3 hours after a meal): Less than 180 mg/dL (10 mmol/l)  Expected Course  Your blood sugar continues to get above 240 mg/dl (13 mmol/l).  Your blood sugar continues to be higher than your daily glucose goals (set by you and your doctor).  It has been longer than 6 months since you had an Hemoglobin A1C test.  Patient Will Follow Care Advice:  YES

## 2013-06-10 ENCOUNTER — Emergency Department (INDEPENDENT_AMBULATORY_CARE_PROVIDER_SITE_OTHER)
Admission: EM | Admit: 2013-06-10 | Discharge: 2013-06-10 | Disposition: A | Discharge status: Home / Self Care | Payer: Medicare Other | Source: Home / Self Care | Attending: Family Medicine | Admitting: Family Medicine

## 2013-06-10 ENCOUNTER — Encounter (HOSPITAL_COMMUNITY): Payer: Self-pay | Admitting: Emergency Medicine

## 2013-06-10 DIAGNOSIS — E119 Type 2 diabetes mellitus without complications: Secondary | ICD-10-CM

## 2013-06-10 DIAGNOSIS — J069 Acute upper respiratory infection, unspecified: Secondary | ICD-10-CM

## 2013-06-10 LAB — POCT I-STAT, CHEM 8
BUN: 18 mg/dL (ref 6–23)
CALCIUM ION: 1.19 mmol/L (ref 1.13–1.30)
Chloride: 103 mEq/L (ref 96–112)
Creatinine, Ser: 1.1 mg/dL (ref 0.50–1.10)
Glucose, Bld: 121 mg/dL — ABNORMAL HIGH (ref 70–99)
HEMATOCRIT: 44 % (ref 36.0–46.0)
HEMOGLOBIN: 15 g/dL (ref 12.0–15.0)
Potassium: 3.8 mEq/L (ref 3.7–5.3)
Sodium: 140 mEq/L (ref 137–147)
TCO2: 23 mmol/L (ref 0–100)

## 2013-06-10 LAB — POCT RAPID STREP A: Streptococcus, Group A Screen (Direct): NEGATIVE

## 2013-06-10 MED ORDER — IPRATROPIUM BROMIDE 0.06 % NA SOLN: 2.0000 | Freq: Four times a day (QID) | NASAL | Status: DC

## 2013-06-10 NOTE — Discharge Instructions (Signed)
See yout doctor for monitor recheck. Drink plenty of fluids

## 2013-06-10 NOTE — ED Provider Notes (Signed)
CSN: [id number]     Arrival date & time 06/10/13  1248 History   First MD Initiated Contact with Patient 06/10/13 1320     Chief Complaint  Patient presents with  . Diabetes  . Sore Throat     (Consider location/radiation/quality/duration/timing/severity/associated sxs/prior Treatment) Patient is a 79 y.o. female presenting with diabetes problem and pharyngitis. The history is provided by the patient and a relative.  Diabetes This is a chronic problem. The current episode started yesterday (bs been high on home monitor since yest.). The problem has been gradually worsening. The symptoms are aggravated by swallowing.  Sore Throat This is a new problem. The current episode started more than 1 week ago. The problem has been gradually worsening. The symptoms are aggravated by swallowing.    Past Medical History  Diagnosis Date  . Hyperlipidemia   . Hypertension   . Diabetes mellitus     Type II  . GERD (gastroesophageal reflux disease)   . Allergy   . Syncope and collapse     1 episode  . Orthostatic hypotension 04/2006    Hospital  . Palpitations   . Acute pharyngitis   . Other abscess of vulva   . Acute sinusitis, unspecified   . Acute upper respiratory infections of unspecified site   . Hemangioma of unspecified site   . Anal and rectal polyp   . Cataract     History of  . Osteopenia   . Osteoarthritis   . Gout   . Cavernous angioma   . Degenerative cervical disc 02/2006   Past Surgical History  Procedure Laterality Date  . Abdominal hysterectomy    . Cataract extraction    . Cavernous hemangioma  04/2004    Hospital  . Cavernous angioma    . Aortic dopplers      Mild plaque.  EEG okay  . Total knee arthroplasty      Right   Family History  Problem Relation Age of Onset  . Cancer Maternal Aunt     Breast CA  . Heart disease Other     CHF   History  Substance Use Topics  . Smoking status: Former Research scientist (life sciences)  . Smokeless tobacco: Never Used     Comment: quit  1970  . Alcohol Use: Yes     Comment: wine occassionally   OB History   Grav Para Term Preterm Abortions TAB SAB Ect Mult Living                 Review of Systems  Constitutional: Negative.  Negative for fever and chills.  HENT: Positive for sore throat.   Respiratory: Negative.   Cardiovascular: Negative.       Allergies  Atorvastatin; Codeine; and Rosuvastatin  Home Medications   Current Outpatient Rx  Name  Route  Sig  Dispense  Refill  . aspirin 325 MG tablet   Oral   Take 162 mg by mouth daily.          Marland Kitchen azithromycin (ZITHROMAX) 250 MG tablet      2 tab po x 1 day then 1 tab po daily   6 tablet   0   . Calcium Citrate (CITRACAL PO)   Oral   Take 1 capsule by mouth daily.          . fish oil-omega-3 fatty acids 1000 MG capsule   Oral   Take 1 g by mouth daily.           Marland Kitchen  Multiple Vitamin (MULTIVITAMIN) tablet   Oral   Take 1 tablet by mouth daily.           . ramipril (ALTACE) 5 MG capsule   Oral   Take 5 mg by mouth daily.         . simvastatin (ZOCOR) 40 MG tablet   Oral   Take 40 mg by mouth at bedtime.         Marland Kitchen alendronate (FOSAMAX) 70 MG tablet   Oral   Take 70 mg by mouth once a week.          . fluticasone (FLONASE) 50 MCG/ACT nasal spray   Each Nare   Place 2 sprays into both nostrils daily.   16 g   6   . glucose blood (FREESTYLE TEST STRIPS) test strip      Use to check blood sugar 1-2 times per day. Dx code 250.00 non insulin dependent.   100 each   3   . ipratropium (ATROVENT) 0.06 % nasal spray   Each Nare   Place 2 sprays into both nostrils 4 (four) times daily.   15 mL   1   . ketoconazole (NIZORAL) 2 % cream   Topical   Apply topically daily.   30 g   0   . Magnesium Hydroxide (PHILLIPS MILK OF MAGNESIA PO)   Oral   Take 1 tablet by mouth daily. 3-4 times per week           BP 184/105  Pulse 90  Temp(Src) 98.3 F (36.8 C) (Oral)  Resp 18  SpO2 99% Physical Exam  Nursing note and  vitals reviewed. Constitutional: She is oriented to person, place, and time. She appears well-developed and well-nourished.  HENT:  Head: Normocephalic.  Right Ear: External ear normal.  Left Ear: External ear normal.  Mouth/Throat: Oropharynx is clear and moist.  Eyes: Conjunctivae are normal. Pupils are equal, round, and reactive to light.  Neck: Normal range of motion. Neck supple.  Cardiovascular: Intact distal pulses.   Pulmonary/Chest: Breath sounds normal.  Lymphadenopathy:    She has no cervical adenopathy.  Neurological: She is alert and oriented to person, place, and time.  Skin: Skin is warm and dry.    ED Course  Procedures (including critical care time) Labs Review Labs Reviewed  POCT I-STAT, CHEM 8 - Abnormal; Notable for the following:    Glucose, Bld 121 (*)    All other components within normal limits  POCT RAPID STREP A ([facility] ONLY)   Imaging Review No results found. Strep neg.   MDM   Final diagnoses:  URI (upper respiratory infection)  DM (diabetes mellitus)        Billy Fischer, MD 06/10/13 1400

## 2013-06-10 NOTE — ED Notes (Signed)
Pt c/o blood sugars being too high Also c/o ST onset 5 days PCP Rx her Z-pack that she did not finish due to s/e  She is alert w/no signs of acute distress.

## 2013-06-11 ENCOUNTER — Telehealth: Payer: Self-pay | Admitting: Family Medicine

## 2013-06-11 ENCOUNTER — Ambulatory Visit (INDEPENDENT_AMBULATORY_CARE_PROVIDER_SITE_OTHER): Payer: Medicare Other | Admitting: Family Medicine

## 2013-06-11 ENCOUNTER — Encounter: Payer: Self-pay | Admitting: Family Medicine

## 2013-06-11 VITALS — BP 135/83 | HR 80 | Temp 97.9°F | Ht 65.5 in | Wt 145.5 lb

## 2013-06-11 DIAGNOSIS — I10 Essential (primary) hypertension: Secondary | ICD-10-CM | POA: Diagnosis not present

## 2013-06-11 DIAGNOSIS — E119 Type 2 diabetes mellitus without complications: Secondary | ICD-10-CM

## 2013-06-11 DIAGNOSIS — J209 Acute bronchitis, unspecified: Secondary | ICD-10-CM | POA: Diagnosis not present

## 2013-06-11 DIAGNOSIS — J208 Acute bronchitis due to other specified organisms: Secondary | ICD-10-CM

## 2013-06-11 NOTE — Progress Notes (Signed)
Date:  06/11/2013   Name:  [name]   DOB:  1922/09/01   MRN:  [id number] Gender: female Age: 80 y.o.  Primary Physician:  Eliezer Lofts, MD   Chief Complaint: Follow up UC Visit   Subjective:   History of Present Illness:  [name] is a 80 y.o. very pleasant female patient who presents with the following:  F/u Urgent Care and higher BS. Into the 200 range, which is unusual for her.   BP also elevated, but it has returned down to normal now.  BS - 150 - 250 Lab Results  Component Value Date   HGBA1C 6.9* 03/04/2013   S/p 5 days of zpak, still with cough but improving  Past Medical History, Surgical History, Social History, Family History, Problem List, Medications, and Allergies have been reviewed and updated if relevant.  Review of Systems: ROS: GEN: Acute illness details above GI: Tolerating PO intake GU: maintaining adequate hydration and urination Pulm: No SOB Interactive and getting along well at home.  Otherwise, ROS is as per the HPI.   Objective:   Physical Examination: BP 135/83  Pulse 80  Temp(Src) 97.9 F (36.6 C) (Oral)  Ht 5' 5.5" (1.664 m)  Wt 145 lb 8 oz (65.998 kg)  BMI 23.84 kg/m2  SpO2 96%   GEN: A and O x 3. WDWN. NAD.    ENT: Nose clear, ext NML.  No LAD.  No JVD.  TM's clear. Oropharynx clear.  PULM: Normal WOB, no distress. No crackles, wheezes, rhonchi. CV: RRR, no M/G/R, No rubs, No JVD.   EXT: warm and well-perfused, No c/c/e. PSYCH: Pleasant and conversant.   Laboratory and Imaging Data:  Assessment & Plan:    Type II or unspecified type diabetes mellitus without mention of complication, not stated as uncontrolled - Plan: Hemoglobin A1c  HYPERTENSION  Viral bronchitis  Reassured about supportive care Do nothing about HTN - normal today  Check a1c, if elevated then may need meds, but increase now could be from cortisol from acute illness.  There are no Patient Instructions on file for this  visit.  Orders Placed This Encounter  Procedures  . Hemoglobin A1c    New medications, updates to list, dose adjustments: No orders of the defined types were placed in this encounter.    Signed,  Maud Deed. Yazleemar Strassner, MD, [place] at [facility] [place] [address] Phone: [phone] Fax: [phone]    Medication List       This list is accurate as of: 06/11/13  8:02 PM.  Always use your most recent med list.               alendronate 70 MG tablet  Commonly known as:  FOSAMAX  Take 70 mg by mouth once a week.     aspirin 325 MG tablet  Take 162 mg by mouth daily.     CITRACAL PO  Take 1 capsule by mouth daily.     fish oil-omega-3 fatty acids 1000 MG capsule  Take 1 g by mouth daily.     fluticasone 50 MCG/ACT nasal spray  Commonly known as:  FLONASE  Place 2 sprays into both nostrils daily.     glucose blood test strip  Commonly known as:  FREESTYLE TEST STRIPS  Use to check blood sugar 1-2 times per day. Dx code 250.00 non insulin dependent.     ipratropium 0.06 % nasal spray  Commonly  known as:  ATROVENT  Place 2 sprays into both nostrils 4 (four) times daily.     ketoconazole 2 % cream  Commonly known as:  NIZORAL  Apply topically daily.     multivitamin tablet  Take 1 tablet by mouth daily.     PHILLIPS MILK OF MAGNESIA PO  Take 1 tablet by mouth daily. 3-4 times per week     ramipril 5 MG capsule  Commonly known as:  ALTACE  Take 5 mg by mouth daily.     simvastatin 40 MG tablet  Commonly known as:  ZOCOR  Take 40 mg by mouth at bedtime.

## 2013-06-11 NOTE — Progress Notes (Signed)
Pre-visit discussion using our clinic review tool. No additional management support is needed unless otherwise documented below in the visit note.  

## 2013-06-11 NOTE — Telephone Encounter (Signed)
Called and Spoke with Ms. [name].  She states she went to the Urgent Care Yesterday.  She has stopped the azithromycin.  She states they told her she had an URI.  She is concerned because her blood pressure was elevated and her blood sugars are running high.  Appointment scheduled with Dr. Lorelei Pont today 06/11/2013 @ 3:15pm.

## 2013-06-11 NOTE — Telephone Encounter (Signed)
[facility] Triage Call Report Triage Record Num: [id number] Operator: Thereasa Parkin Patient Name: [name] Call Date & Time: 06/10/2013 11:49:16AM Patient Phone: [phone] PCP: Eliezer Lofts Patient Gender: Female PCP Fax : [phone] Patient DOB: [birth year] Practice Name: [facility] Reason for Call: Caller: [place]/Patient; PCP: Eliezer Lofts (Family Practice); CB#: [phone]; Call regarding Elevated blood sugar; 2nd call in 24 hours regarding hyperglycemia. Forgot to test FBS. Felt "terrible" with weakness and shaking after eating so tested bloood sugar. Random blood sugar 1 hour after eating was 298 with old test strips, then 221 and 214 within 15-30 minutes later. Drinking water. Stopped Azithromycin 06/07/13 after 2nd dose due to did not like the way it made her feel. Continues to have sore throat. Advised to see [facility] within 4 hours for new or increasing symptoms or glucose not within provider defined guidelines and change in treatment plan in last 72 hours per Diabetes Control Problems. Protocol(s) Used: Diabetes: Control Problems Recommended Outcome per Protocol: See Provider within 4 hours Override Outcome if Used in Protocol: See ED Immediately RN Reason for Override Outcome: Office Is Closed. Reason for Outcome: New or increasing symptoms OR glucose not within provider defined guidelines AND prescribed change in medication or treatment plan in last 72 hours Care Advice: ~ Another adult should drive. ~ Call provider if symptoms worsen before scheduled appointment. ~ SYMPTOM / CONDITION MANAGEMENT ~ List, or take, all current prescription(s), nonprescription or alternative medication(s) to provider for evaluation. High Blood Sugar Treatment: - Follow action plan. - Adjust medications if instructed to do so in action plan or by provider. - Test blood sugar and ketones more often. - Record blood sugar and ketones in meter log or separate  logbook, and take to all provider visits. - Drink extra water and other non-sugar fluids to prevent dehydration when the blood sugar is high and urine ketones are present. ~ 06/10/2013 12:05:04PM Page 1 of 1 [email or web address]

## 2013-06-12 LAB — CULTURE, GROUP A STREP

## 2013-06-12 LAB — HEMOGLOBIN A1C: Hgb A1c MFr Bld: 7.1 % — ABNORMAL HIGH (ref 4.6–6.5)

## 2013-06-12 NOTE — Telephone Encounter (Signed)
Noted  

## 2013-07-06 ENCOUNTER — Other Ambulatory Visit: Payer: Self-pay | Admitting: Cardiology

## 2013-07-31 DIAGNOSIS — H01009 Unspecified blepharitis unspecified eye, unspecified eyelid: Secondary | ICD-10-CM | POA: Diagnosis not present

## 2013-07-31 DIAGNOSIS — H524 Presbyopia: Secondary | ICD-10-CM | POA: Diagnosis not present

## 2013-07-31 DIAGNOSIS — H43399 Other vitreous opacities, unspecified eye: Secondary | ICD-10-CM | POA: Diagnosis not present

## 2013-07-31 DIAGNOSIS — E119 Type 2 diabetes mellitus without complications: Secondary | ICD-10-CM | POA: Diagnosis not present

## 2013-07-31 DIAGNOSIS — H35039 Hypertensive retinopathy, unspecified eye: Secondary | ICD-10-CM | POA: Diagnosis not present

## 2013-08-02 ENCOUNTER — Other Ambulatory Visit: Payer: Self-pay | Admitting: Family Medicine

## 2013-08-29 ENCOUNTER — Other Ambulatory Visit: Payer: Medicare Other

## 2013-09-05 ENCOUNTER — Encounter: Payer: Medicare Other | Admitting: Family Medicine

## 2013-09-09 ENCOUNTER — Other Ambulatory Visit: Payer: Self-pay | Admitting: Cardiovascular Disease

## 2013-09-26 ENCOUNTER — Other Ambulatory Visit: Payer: Self-pay | Admitting: Family Medicine

## 2013-09-28 ENCOUNTER — Telehealth: Payer: Self-pay | Admitting: Family Medicine

## 2013-09-28 DIAGNOSIS — E785 Hyperlipidemia, unspecified: Secondary | ICD-10-CM

## 2013-09-28 DIAGNOSIS — M109 Gout, unspecified: Secondary | ICD-10-CM

## 2013-09-28 NOTE — Telephone Encounter (Signed)
Message copied by [name] on Sun Sep 28, 2013 10:29 PM ------      Message from: Ellamae Sia      Created: Fri Sep 26, 2013  8:38 AM      Regarding: Lab orders for Tuesday, 6.9.15       Patient is scheduled for CPX labs, please order future labs, Thanks , Terri       ------

## 2013-09-30 ENCOUNTER — Other Ambulatory Visit (INDEPENDENT_AMBULATORY_CARE_PROVIDER_SITE_OTHER): Payer: Medicare Other

## 2013-09-30 DIAGNOSIS — I10 Essential (primary) hypertension: Secondary | ICD-10-CM

## 2013-09-30 DIAGNOSIS — E785 Hyperlipidemia, unspecified: Secondary | ICD-10-CM

## 2013-09-30 DIAGNOSIS — E119 Type 2 diabetes mellitus without complications: Secondary | ICD-10-CM | POA: Diagnosis not present

## 2013-09-30 LAB — COMPREHENSIVE METABOLIC PANEL
ALT: 14 U/L (ref 0–35)
AST: 18 U/L (ref 0–37)
Albumin: 3.6 g/dL (ref 3.5–5.2)
Alkaline Phosphatase: 41 U/L (ref 39–117)
BILIRUBIN TOTAL: 0.7 mg/dL (ref 0.2–1.2)
BUN: 18 mg/dL (ref 6–23)
CO2: 27 meq/L (ref 19–32)
CREATININE: 1 mg/dL (ref 0.4–1.2)
Calcium: 9.2 mg/dL (ref 8.4–10.5)
Chloride: 108 mEq/L (ref 96–112)
GFR: 54.27 mL/min — AB (ref >60.00)
GLUCOSE: 115 mg/dL — AB (ref 70–99)
Potassium: 4.1 mEq/L (ref 3.5–5.1)
SODIUM: 141 meq/L (ref 135–145)
Total Protein: 6.9 g/dL (ref 6.0–8.3)

## 2013-10-07 ENCOUNTER — Ambulatory Visit (INDEPENDENT_AMBULATORY_CARE_PROVIDER_SITE_OTHER): Payer: Medicare Other | Admitting: Family Medicine

## 2013-10-07 VITALS — BP 132/74 | HR 91 | Temp 97.8°F | Ht 63.0 in | Wt 143.5 lb

## 2013-10-07 DIAGNOSIS — Z23 Encounter for immunization: Secondary | ICD-10-CM

## 2013-10-07 DIAGNOSIS — M109 Gout, unspecified: Secondary | ICD-10-CM

## 2013-10-07 DIAGNOSIS — M25519 Pain in unspecified shoulder: Secondary | ICD-10-CM

## 2013-10-07 DIAGNOSIS — I10 Essential (primary) hypertension: Secondary | ICD-10-CM | POA: Diagnosis not present

## 2013-10-07 DIAGNOSIS — M25511 Pain in right shoulder: Secondary | ICD-10-CM

## 2013-10-07 DIAGNOSIS — Z0000 Encounter for general adult medical examination without abnormal findings: Secondary | ICD-10-CM | POA: Diagnosis not present

## 2013-10-07 DIAGNOSIS — E119 Type 2 diabetes mellitus without complications: Secondary | ICD-10-CM

## 2013-10-07 DIAGNOSIS — E785 Hyperlipidemia, unspecified: Secondary | ICD-10-CM | POA: Diagnosis not present

## 2013-10-07 NOTE — Progress Notes (Signed)
I have personally reviewed the Medicare Annual Wellness questionnaire and have noted  1. The patient's medical and social history  2. Their use of alcohol, tobacco or illicit drugs  3. Their current medications and supplements  4. The patient's functional ability including ADL's, fall risks, home safety risks and hearing or visual  impairment.  5. Diet and physical activities  6. Evidence for depression or mood disorders  The patients weight, height, BMI and visual acuity have been recorded in the chart  I have made referrals, counseling and provided education to the patient based review of the above and I have provided the pt with a written personalized care plan for preventive services.   Right shoulder pain off and on. Pain with moving arm above her  Head and with int/ext rotation Limited movement in right shoulder. Ongoing in last year. No falls.  Redness in left foot constant, mild pain off and on ongoing x 1 year.  Diabetes: On glyburide, worsening control over time, but still fairly well controlled.  Wt Readings from Last 3 Encounters:  10/07/13 143 lb 8 oz (65.091 kg)  06/11/13 145 lb 8 oz (65.998 kg)  06/06/13 148 lb 8 oz (67.359 kg)   Lab Results  Component Value Date   HGBA1C 7.1* 06/11/2013  Using medications without difficulties:  Hypoglycemic episodes: None  Hyperglycemic episodes:None  Feet problems:None  Blood Sugars averaging: Not checking at home.  eye exam within last year: In last year, had floater in last week, but resolved now.   Elevated Cholesterol: Previously at goal on simvastatin 40 mg daily, fish oil.  Lab Results  Component Value Date   CHOL 143 03/04/2013   HDL 55.20 03/04/2013   LDLCALC 72 03/04/2013   TRIG 77.0 03/04/2013   CHOLHDL 3 03/04/2013  Using medications without problems: yes  Muscle aches: None  Other complaints:   Gout: no further gout flares. Used colchicine as needed.  Uric acid in nml range in 04/2013  Hypertension: Well  controlled on ramipril  BP Readings from Last 3 Encounters:  10/07/13 132/74  06/11/13 135/83  06/10/13 184/105  Using medication without problems or lightheadedness:  Chest pain with exertion: none  Edema:None  Short of breath:None  Average home BPs: not checking BP  Other issues:   Osteoporosis: Doing well on fosamax. She forgets to take the fosamax a lot!  Started for worsening osteo in 2011.  Last DXA 2011.   Review of Systems  Constitutional: Negative for fever and fatigue.  HENT: Negative for ear pain.  Eyes: Negative for pain.  Respiratory: Negative for cough, chest tightness and shortness of breath.  Cardiovascular: Negative for chest pain, palpitations and leg swelling.  Gastrointestinal: Negative for nausea, diarrhea, occ constipation, straining with BMs blood in stool and rectal pain.  Genitourinary: Negative for dysuria, vaginal bleeding, vaginal discharge, vaginal pain and pelvic pain.  No breast lesions.  Neurological: Negative for tremors, syncope, weakness and numbness.  Psychiatric/Behavioral: Negative for self-injury, dysphoric mood and agitation. The patient is not nervous/anxious.  Objective:   Physical Exam  Constitutional: Vital signs are normal. She appears well-developed and well-nourished. She is cooperative. Non-toxic appearance. She does not appear ill. No distress.  Elderly female in NAD  HENT:  Head: Normocephalic.  Right Ear: Hearing, tympanic membrane, external ear and ear canal normal.  Left Ear: Hearing, tympanic membrane, external ear and ear canal normal.  Nose: Nose normal.  Eyes: Conjunctivae, EOM and lids are normal. Pupils are equal, round, and reactive to  light. No foreign bodies found.  Neck: Trachea normal and normal range of motion. Neck supple. Carotid bruit is not present. No mass and no thyromegaly present.  Cardiovascular: Normal rate, regular rhythm, S1 normal, S2 normal, normal heart sounds and intact distal pulses. Exam reveals  no gallop.  No murmur heard.  Pulmonary/Chest: Effort normal and breath sounds normal. No respiratory distress. She has no wheezes. She has no rhonchi. She has no rales.  Abdominal: Soft. Normal appearance and bowel sounds are normal. She exhibits no distension, no fluid wave, no abdominal bruit and no mass. There is no hepatosplenomegaly. There is no tenderness. There is no rebound, no guarding and no CVA tenderness. No hernia.  Lymphadenopathy:  She has no cervical adenopathy.  She has no axillary adenopathy.  Neurological: She is alert. She has normal strength. No cranial nerve deficit or sensory deficit.  Skin: Skin is warm, dry and intact. No rash noted.  Psychiatric: Her speech is normal and behavior is normal. Judgment normal. Her mood appears not anxious. Cognition and memory are normal. She does not exhibit a depressed mood.  MSK: decreased ROM B , no pain. Diabetic foot exam:  Normal inspection  No skin breakdown  No calluses  Normal DP pulses  Normal sensation to light touch and monofilament  Nails normal  Assessment & Plan:   Annual Medicare Wellnes: The patient's preventative maintenance and recommended screening tests for an annual wellness exam were reviewed in full today.  Brought up to date unless services declined.  Counselled on the importance of diet, exercise, and its role in overall health and mortality.  The patient's FH and SH was reviewed, including their home life, tobacco status, and drug and alcohol status.   DEXA 2014 stable and slightly improved in spine  Has been on fosamax for 4 years.  Will stop fosamax and rec in 2 yea Colonoscopy in 2009,Dr. Edwards recommended no further needed. Up to Date with vaccines  Wishes to continue mammograms: last 02/2011, pt to schedule.  No pap or vaginal exam needed.  Vaccines: uptodate, will get prevnar today.

## 2013-10-07 NOTE — Patient Instructions (Addendum)
Start citrical daily. Drink water. Start with stretching exercise of right shoulder, if not improving, call for referral.  Stop alendronate. Call and schedule mammogram... Please give her phone number at front desk.  Follow up in 6 months 30 min OV.

## 2013-10-07 NOTE — Progress Notes (Signed)
Pre visit review using our clinic review tool, if applicable. No additional management support is needed unless otherwise documented below in the visit note. 

## 2013-10-13 ENCOUNTER — Telehealth: Payer: Self-pay | Admitting: Family Medicine

## 2013-10-13 NOTE — Telephone Encounter (Signed)
Pt is having knee pain and has tried twice to put her phone # in at [facility], but keeps getting a mssg that she is doing it incorrectly.  Can you please call pt to let her know what she can take at night time, the pain is keeping her awake. I scheduled her an appointment w/Dr. Diona Browner for tomorrow but she says she is so tired from not sleeping. Please call patient. Thank you.

## 2013-10-13 NOTE — Telephone Encounter (Signed)
i would start with 2 extra strength tylenol, up to 4 times a day. Then we can reassess in the office.

## 2013-10-13 NOTE — Telephone Encounter (Signed)
Ms. Richins notified as instructed by telephone.  She states her knee doesn't bother her during the day but wakes her up hurting during the night. She is scheduled to see Dr. Diona Browner tomorrow at 10:30am for assessment.

## 2013-10-14 ENCOUNTER — Ambulatory Visit (INDEPENDENT_AMBULATORY_CARE_PROVIDER_SITE_OTHER): Payer: Medicare Other | Admitting: Family Medicine

## 2013-10-14 ENCOUNTER — Encounter: Payer: Self-pay | Admitting: Family Medicine

## 2013-10-14 VITALS — BP 128/76 | HR 81 | Temp 98.0°F | Ht 63.0 in | Wt 143.5 lb

## 2013-10-14 DIAGNOSIS — M1710 Unilateral primary osteoarthritis, unspecified knee: Secondary | ICD-10-CM | POA: Diagnosis not present

## 2013-10-14 DIAGNOSIS — M1712 Unilateral primary osteoarthritis, left knee: Secondary | ICD-10-CM

## 2013-10-14 DIAGNOSIS — M1711 Unilateral primary osteoarthritis, right knee: Secondary | ICD-10-CM | POA: Insufficient documentation

## 2013-10-14 MED ORDER — DICLOFENAC SODIUM 1 % TD GEL: 4.0000 g | Freq: Four times a day (QID) | TRANSDERMAL | Status: DC

## 2013-10-14 MED ORDER — MELOXICAM 7.5 MG PO TABS: 7.5000 mg | ORAL_TABLET | Freq: Every day | ORAL | Status: DC

## 2013-10-14 NOTE — Progress Notes (Signed)
   Subjective:    Patient ID: [name], female    DOB: May 04, 1922, 80 y.o.   MRN: [id number]  Knee Pain     80 year old female presents with her daughter for left knee pain ongoing x 1 week.  No known injury. Pain in knee is better during the day,worse at night.  No pain with walking but she feels knee is different, slight achy. occ click.  Not unsteady Trouble sleeping with pain. Tylenol no relief.   She just got back from [place] last week.. She had walkied up a hill and around a lot.   She also had prevnar shot in left upper arm. She noted a  painful knot and redness later that day, few hours later.. Now pain is gone and slightly itchy. No fatigue, feels well otherwise. No fever.  She has hx of lleft knee arthritis,   Review of Systems  Constitutional: Negative for fever and fatigue.  HENT: Negative for ear pain.   Eyes: Negative for pain.  Respiratory: Negative for chest tightness and shortness of breath.   Cardiovascular: Negative for chest pain, palpitations and leg swelling.  Gastrointestinal: Negative for abdominal pain.  Genitourinary: Negative for dysuria.       Objective:   Physical Exam  Constitutional: Vital signs are normal. She appears well-developed and well-nourished. She is cooperative.  Non-toxic appearance. She does not appear ill. No distress.  HENT:  Head: Normocephalic.  Right Ear: Hearing, tympanic membrane, external ear and ear canal normal. Tympanic membrane is not erythematous, not retracted and not bulging.  Left Ear: Hearing, tympanic membrane, external ear and ear canal normal. Tympanic membrane is not erythematous, not retracted and not bulging.  Nose: No mucosal edema or rhinorrhea. Right sinus exhibits no maxillary sinus tenderness and no frontal sinus tenderness. Left sinus exhibits no maxillary sinus tenderness and no frontal sinus tenderness.  Mouth/Throat: Uvula is midline, oropharynx is clear and moist and mucous membranes are  normal.  Eyes: Conjunctivae, EOM and lids are normal. Pupils are equal, round, and reactive to light. Lids are everted and swept, no foreign bodies found.  Neck: Trachea normal and normal range of motion. Neck supple. Carotid bruit is not present. No mass and no thyromegaly present.  Cardiovascular: Normal rate, regular rhythm, S1 normal, S2 normal, normal heart sounds, intact distal pulses and normal pulses.  Exam reveals no gallop and no friction rub.   No murmur heard. Pulmonary/Chest: Effort normal and breath sounds normal. Not tachypneic. No respiratory distress. She has no decreased breath sounds. She has no wheezes. She has no rhonchi. She has no rales.  Abdominal: Soft. Normal appearance and bowel sounds are normal. There is no tenderness.  Musculoskeletal:       Left knee: She exhibits decreased range of motion and swelling. She exhibits no effusion, no bony tenderness, normal meniscus and no MCL laxity. Tenderness found. Lateral joint line tenderness noted. No MCL, no LCL and no patellar tendon tenderness noted.  Neurological: She is alert.  Skin: Skin is warm, dry and intact. No rash noted.  Psychiatric: Her speech is normal and behavior is normal. Judgment and thought content normal. Her mood appears not anxious. Cognition and memory are normal. She does not exhibit a depressed mood.          Assessment & Plan:

## 2013-10-14 NOTE — Patient Instructions (Signed)
Try diclofenac gel four times a day for inflammation. If not affordable can instead use meloxicam. Don't not take together. If not improving as expected in 2 weeks or severe pain, call.

## 2013-10-14 NOTE — Progress Notes (Signed)
Pre visit review using our clinic review tool, if applicable. No additional management support is needed unless otherwise documented below in the visit note. 

## 2013-10-14 NOTE — Assessment & Plan Note (Signed)
No sign of knee injury. Treat with topical voltaren ( if to pricey try meloxicam).  If not improving as expected consider steroid injection.

## 2013-10-15 ENCOUNTER — Encounter (HOSPITAL_COMMUNITY): Payer: Self-pay | Admitting: Emergency Medicine

## 2013-10-15 ENCOUNTER — Emergency Department (HOSPITAL_COMMUNITY): Payer: Medicare Other

## 2013-10-15 ENCOUNTER — Emergency Department (HOSPITAL_COMMUNITY)
Admission: EM | Admit: 2013-10-15 | Discharge: 2013-10-15 | Disposition: A | Discharge status: Home / Self Care | Payer: Medicare Other | Attending: Emergency Medicine | Admitting: Emergency Medicine

## 2013-10-15 ENCOUNTER — Telehealth: Payer: Self-pay | Admitting: Family Medicine

## 2013-10-15 DIAGNOSIS — E119 Type 2 diabetes mellitus without complications: Secondary | ICD-10-CM | POA: Insufficient documentation

## 2013-10-15 DIAGNOSIS — M169 Osteoarthritis of hip, unspecified: Secondary | ICD-10-CM | POA: Diagnosis not present

## 2013-10-15 DIAGNOSIS — Z8742 Personal history of other diseases of the female genital tract: Secondary | ICD-10-CM | POA: Insufficient documentation

## 2013-10-15 DIAGNOSIS — Z791 Long term (current) use of non-steroidal anti-inflammatories (NSAID): Secondary | ICD-10-CM | POA: Diagnosis not present

## 2013-10-15 DIAGNOSIS — I10 Essential (primary) hypertension: Secondary | ICD-10-CM | POA: Insufficient documentation

## 2013-10-15 DIAGNOSIS — IMO0002 Reserved for concepts with insufficient information to code with codable children: Secondary | ICD-10-CM | POA: Diagnosis not present

## 2013-10-15 DIAGNOSIS — Z8669 Personal history of other diseases of the nervous system and sense organs: Secondary | ICD-10-CM | POA: Diagnosis not present

## 2013-10-15 DIAGNOSIS — K219 Gastro-esophageal reflux disease without esophagitis: Secondary | ICD-10-CM | POA: Insufficient documentation

## 2013-10-15 DIAGNOSIS — Z8709 Personal history of other diseases of the respiratory system: Secondary | ICD-10-CM | POA: Diagnosis not present

## 2013-10-15 DIAGNOSIS — M129 Arthropathy, unspecified: Secondary | ICD-10-CM | POA: Diagnosis not present

## 2013-10-15 DIAGNOSIS — Z79899 Other long term (current) drug therapy: Secondary | ICD-10-CM | POA: Insufficient documentation

## 2013-10-15 DIAGNOSIS — M1610 Unilateral primary osteoarthritis, unspecified hip: Secondary | ICD-10-CM | POA: Diagnosis not present

## 2013-10-15 DIAGNOSIS — Z87891 Personal history of nicotine dependence: Secondary | ICD-10-CM | POA: Diagnosis not present

## 2013-10-15 DIAGNOSIS — Z7982 Long term (current) use of aspirin: Secondary | ICD-10-CM | POA: Diagnosis not present

## 2013-10-15 DIAGNOSIS — E785 Hyperlipidemia, unspecified: Secondary | ICD-10-CM | POA: Diagnosis not present

## 2013-10-15 DIAGNOSIS — M25511 Pain in right shoulder: Secondary | ICD-10-CM | POA: Insufficient documentation

## 2013-10-15 DIAGNOSIS — M1990 Unspecified osteoarthritis, unspecified site: Secondary | ICD-10-CM

## 2013-10-15 DIAGNOSIS — M1710 Unilateral primary osteoarthritis, unspecified knee: Secondary | ICD-10-CM | POA: Diagnosis not present

## 2013-10-15 MED ORDER — TRAMADOL HCL 50 MG PO TABS: 50.0000 mg | ORAL_TABLET | Freq: Four times a day (QID) | ORAL | Status: DC | PRN

## 2013-10-15 MED ORDER — TRAMADOL HCL 50 MG PO TABS
50.0000 mg | ORAL_TABLET | Freq: Once | ORAL | Status: AC
Start: 2013-10-15 — End: 2013-10-15
  Administered 2013-10-15: 50 mg via ORAL
  Filled 2013-10-15: qty 1

## 2013-10-15 NOTE — Assessment & Plan Note (Signed)
Well controlled. Continue current medication.  

## 2013-10-15 NOTE — Discharge Instructions (Signed)
Arthritis, Nonspecific °Arthritis is inflammation of a joint. This usually means pain, redness, warmth or swelling are present. One or more joints may be involved. There are a number of types of arthritis. Your caregiver may not be able to tell what type of arthritis you have right away. °CAUSES  °The most common cause of arthritis is the wear and tear on the joint (osteoarthritis). This causes damage to the cartilage, which can break down over time. The knees, hips, back and neck are most often affected by this type of arthritis. °Other types of arthritis and common causes of joint pain include: °· Sprains and other injuries near the joint. Sometimes minor sprains and injuries cause pain and swelling that develop hours later. °· Rheumatoid arthritis. This affects hands, feet and knees. It usually affects both sides of your body at the same time. It is often associated with chronic ailments, fever, weight loss and general weakness. °· Crystal arthritis. Gout and pseudo gout can cause occasional acute severe pain, redness and swelling in the foot, ankle, or knee. °· Infectious arthritis. Bacteria can get into a joint through a break in overlying skin. This can cause infection of the joint. Bacteria and viruses can also spread through the blood and affect your joints. °· Drug, infectious and allergy reactions. Sometimes joints can become mildly painful and slightly swollen with these types of illnesses. °SYMPTOMS  °· Pain is the main symptom. °· Your joint or joints can also be red, swollen and warm or hot to the touch. °· You may have a fever with certain types of arthritis, or even feel overall ill. °· The joint with arthritis will hurt with movement. Stiffness is present with some types of arthritis. °DIAGNOSIS  °Your caregiver will suspect arthritis based on your description of your symptoms and on your exam. Testing may be needed to find the type of arthritis: °· Blood and sometimes urine tests. °· X-ray tests  and sometimes CT or MRI scans. °· Removal of fluid from the joint (arthrocentesis) is done to check for bacteria, crystals or other causes. Your caregiver (or a specialist) will numb the area over the joint with a local anesthetic, and use a needle to remove joint fluid for examination. This procedure is only minimally uncomfortable. °· Even with these tests, your caregiver may not be able to tell what kind of arthritis you have. Consultation with a specialist (rheumatologist) may be helpful. °TREATMENT  °Your caregiver will discuss with you treatment specific to your type of arthritis. If the specific type cannot be determined, then the following general recommendations may apply. °Treatment of severe joint pain includes: °· Rest. °· Elevation. °· Anti-inflammatory medication (for example, ibuprofen) may be prescribed. Avoiding activities that cause increased pain. °· Only take over-the-counter or prescription medicines for pain and discomfort as recommended by your caregiver. °· Cold packs over an inflamed joint may be used for 10 to 15 minutes every hour. Hot packs sometimes feel better, but do not use overnight. Do not use hot packs if you are diabetic without your caregiver's permission. °· A cortisone shot into arthritic joints may help reduce pain and swelling. °· Any acute arthritis that gets worse over the next 1 to 2 days needs to be looked at to be sure there is no joint infection. °Long-term arthritis treatment involves modifying activities and lifestyle to reduce joint stress jarring. This can include weight loss. Also, exercise is needed to nourish the joint cartilage and remove waste. This helps keep the muscles   around the joint strong. °HOME CARE INSTRUCTIONS  °· Do not take aspirin to relieve pain if gout is suspected. This elevates uric acid levels. °· Only take over-the-counter or prescription medicines for pain, discomfort or fever as directed by your caregiver. °· Rest the joint as much as  possible. °· If your joint is swollen, keep it elevated. °· Use crutches if the painful joint is in your leg. °· Drinking plenty of fluids may help for certain types of arthritis. °· Follow your caregiver's dietary instructions. °· Try low-impact exercise such as: °¨ Swimming. °¨ Water aerobics. °¨ Biking. °¨ Walking. °· Morning stiffness is often relieved by a warm shower. °· Put your joints through regular range-of-motion. °SEEK MEDICAL CARE IF:  °· You do not feel better in 24 hours or are getting worse. °· You have side effects to medications, or are not getting better with treatment. °SEEK IMMEDIATE MEDICAL CARE IF:  °· You have a fever. °· You develop severe joint pain, swelling or redness. °· Many joints are involved and become painful and swollen. °· There is severe back pain and/or leg weakness. °· You have loss of bowel or bladder control. °Document Released: 05/18/2004 Document Revised: 07/03/2011 Document Reviewed: 06/03/2008 °ExitCare® Patient Information ©2015 [employer]. This information is not intended to replace advice given to you by your health care provider. Make sure you discuss any questions you have with your health care provider. ° °

## 2013-10-15 NOTE — Assessment & Plan Note (Signed)
Fair control for her age. Encouraged exercise, weight loss, healthy eating habits.

## 2013-10-15 NOTE — ED Notes (Signed)
Pt arrives with complaints of L knee pain, states it really bothers her at night, rates 8/10 during that time. States her PCP gave her an arthritis gel that is not effective. States her pain is 2/10 at this time. States she hasn't fallen or bumper her knee. Mildly swollen on left side.

## 2013-10-15 NOTE — Telephone Encounter (Signed)
Pt called wanting to let dr Diona Browner know that her left knee hurt so bad last night she went to [facility]. They took xrays they gave her tramadol hcl 50mg .    . And she is putting on the gel that dr Diona Browner gave her.

## 2013-10-15 NOTE — ED Provider Notes (Signed)
CSN: [id number]     Arrival date & time 10/15/13  0356 History   First MD Initiated Contact with Patient 10/15/13 0415     Chief Complaint  Patient presents with  . Knee Pain     (Consider location/radiation/quality/duration/timing/severity/associated sxs/prior Treatment) Patient is a 80 y.o. female presenting with knee pain. The history is provided by the patient.  Knee Pain Location:  Knee and hip Time since incident:  4 days Injury: no   Hip location:  L hip Knee location:  L knee Pain details:    Quality:  Aching   Radiates to:  Does not radiate   Severity:  Mild   Onset quality:  Gradual   Duration:  6 days   Timing:  Constant   Progression:  Unchanged Chronicity:  New Dislocation: no   Relieved by:  Nothing Worsened by:  Nothing tried Associated symptoms: no fever     Past Medical History  Diagnosis Date  . Hyperlipidemia   . Hypertension   . Diabetes mellitus     Type II  . GERD (gastroesophageal reflux disease)   . Allergy   . Syncope and collapse     1 episode  . Orthostatic hypotension 04/2006    [facility]ospital  . Palpitations   . Acute pharyngitis   . Other abscess of vulva   . Acute sinusitis, unspecified   . Acute upper respiratory infections of unspecified site   . Hemangioma of unspecified site   . Anal and rectal polyp   . Cataract     History of  . Osteopenia   . Osteoarthritis   . Gout   . Cavernous angioma   . Degenerative cervical disc 02/2006   Past Surgical History  Procedure Laterality Date  . Abdominal hysterectomy    . Cataract extraction    . Cavernous hemangioma  04/2004    Hospital  . Cavernous angioma    . Aortic dopplers      Mild plaque.  EEG okay  . Total knee arthroplasty      Right   Family History  Problem Relation Age of Onset  . Cancer Maternal Aunt     Breast CA  . Heart disease Other     CHF   History  Substance Use Topics  . Smoking status: Former Research scientist (life sciences)  . Smokeless tobacco: Never Used     Comment:  quit 1970  . Alcohol Use: Yes     Comment: wine occassionally   OB History   Grav Para Term Preterm Abortions TAB SAB Ect Mult Living                 Review of Systems  Constitutional: Negative for fever.  Respiratory: Negative for cough and shortness of breath.   All other systems reviewed and are negative.     Allergies  Atorvastatin; Codeine; and Rosuvastatin  Home Medications   Prior to Admission medications   Medication Sig Start Date End Date Taking? Authorizing Provider  aspirin 325 MG tablet Take 162 mg by mouth daily.    Yes Historical Provider, MD  Calcium Citrate (CITRACAL PO) Take 1 capsule by mouth daily.    Yes Historical Provider, MD  diclofenac sodium (VOLTAREN) 1 % GEL Apply 4 g topically 4 (four) times daily. 10/14/13  Yes Amy Cletis Athens, MD  fish oil-omega-3 fatty acids 1000 MG capsule Take 1 g by mouth daily.     Yes Historical Provider, MD  fluticasone (FLONASE) 50 MCG/ACT nasal spray Place 2  sprays into both nostrils daily. 03/07/13  Yes Amy E Bedsole, MD  ipratropium (ATROVENT) 0.06 % nasal spray Place 2 sprays into both nostrils 4 (four) times daily. 06/10/13  Yes Billy Fischer, MD  ketoconazole (NIZORAL) 2 % cream Apply topically daily. 03/07/13  Yes Amy Cletis Athens, MD  Magnesium Hydroxide (PHILLIPS MILK OF MAGNESIA PO) Take 1 tablet by mouth daily. 3-4 times per week    Yes Historical Provider, MD  meloxicam (MOBIC) 7.5 MG tablet Take 1-2 tablets (7.5-15 mg total) by mouth daily. 10/14/13  Yes Amy Cletis Athens, MD  Multiple Vitamin (MULTIVITAMIN) tablet Take 1 tablet by mouth daily.     Yes Historical Provider, MD  ramipril (ALTACE) 5 MG capsule Take 5 mg by mouth daily.   Yes Historical Provider, MD  simvastatin (ZOCOR) 40 MG tablet Take 40 mg by mouth daily.   Yes Historical Provider, MD  glucose blood (FREESTYLE TEST STRIPS) test strip Use to check blood sugar 1-2 times per day. Dx code 250.00 non insulin dependent. 06/09/13   Amy Cletis Athens, MD  traMADol  (ULTRAM) 50 MG tablet Take 1 tablet (50 mg total) by mouth every 6 (six) hours as needed. 10/15/13   Osvaldo Shipper, MD   BP 155/82  Pulse 73  Temp(Src) 98 F (36.7 C) (Oral)  Resp 18  Ht 5\' 4"  (1.626 m)  Wt 143 lb 8 oz (65.091 kg)  BMI 24.62 kg/m2  SpO2 95% Physical Exam  Nursing note and vitals reviewed. Constitutional: She is oriented to person, place, and time. She appears well-developed and well-nourished. No distress.  HENT:  Head: Normocephalic and atraumatic.  Mouth/Throat: Oropharynx is clear and moist.  Eyes: EOM are normal. Pupils are equal, round, and reactive to light.  Neck: Normal range of motion. Neck supple.  Cardiovascular: Normal rate and regular rhythm.  Exam reveals no friction rub.   No murmur heard. Pulmonary/Chest: Effort normal and breath sounds normal. No respiratory distress. She has no wheezes. She has no rales.  Abdominal: Soft. She exhibits no distension. There is no tenderness. There is no rebound.  Musculoskeletal: Normal range of motion. She exhibits no edema.       Left hip: She exhibits normal range of motion, normal strength, no tenderness and no bony tenderness.       Left knee: She exhibits swelling (very mild, superior). She exhibits normal range of motion, no effusion, no ecchymosis, no deformity, no laceration and no erythema. No tenderness found.  Neurological: She is alert and oriented to person, place, and time.  Skin: She is not diaphoretic.    ED Course  Procedures (including critical care time) Labs Review Labs Reviewed - No data to display  Imaging Review Dg Hip Complete Left  10/15/2013   CLINICAL DATA:  Left hip in knee pain for 5 days. No injury. Pain worse at night.  EXAM: LEFT HIP - COMPLETE 2+ VIEW  COMPARISON:  None.  FINDINGS: Degenerative changes in the lower lumbar spine, both hips, and symphysis pubis. No evidence of acute fracture or dislocation of the pelvis or left hip. No focal bone lesion or bone destruction  appreciated. Calcified phleboliths.  IMPRESSION: Degenerative changes.  No acute bony abnormalities.   Electronically Signed   By: Lucienne Capers M.D.   On: 10/15/2013 05:36   Dg Knee Complete 4 Views Left  10/15/2013   CLINICAL DATA:  Knee pain for 5 days, no injury.  EXAM: LEFT KNEE - COMPLETE 4+ VIEW  COMPARISON:  None.  FINDINGS: No acute fracture deformity or dislocation. Tricompartmental osteoarthrosis, severe within the medial compartment, moderate to severe within the patellofemoral compartment. Minimal meniscal calcification. Corticated 10 mm bony fragment projecting along the medial femoral epicondyle could reflect loose body. Tibial spine peaking. No destructive bony lesions. Periarticular soft tissue planes are nonsuspicious.  IMPRESSION: No acute fracture deformity or dislocation.  Tricompartmental osteoarthrosis, severe within the medial compartment.   Electronically Signed   By: Elon Alas   On: 10/15/2013 05:36     EKG Interpretation None      MDM   Final diagnoses:  Arthritis    80 year old female presents to the pain. Began 5 days ago after exercising at church. Worse at night. Better after resting. Mild swelling to the left knee. On exam left knee with mild swelling, no effusion. No warmth or erythema. Patient's history consistent with arthritis. No concern for septic arthritis. X-rays consistent arthritis. Given tramadol and Ortho f/u.   Osvaldo Shipper, MD 10/15/13 [phone]

## 2013-10-15 NOTE — Assessment & Plan Note (Signed)
No pain on exam. Pain is intermittant. Use tylenol for pain.

## 2013-10-15 NOTE — Telephone Encounter (Signed)
Let pt know given so much pain... Lets make her an appt for a steroid injection with ortho or Dr. Lorelei Pont...  Okay? Have her let us know which one.

## 2013-10-16 ENCOUNTER — Telehealth: Payer: Self-pay | Admitting: Family Medicine

## 2013-10-16 NOTE — Telephone Encounter (Signed)
Pt is currently going to Dr. Erlinda Hong for her arthritis but has had several people recommend Dr. Lorre Nick. She would like your opinion and wants you to call her. Could you please call patient to advise? Thank you.

## 2013-10-16 NOTE — Telephone Encounter (Signed)
Spoke with Ms. [name].  She states x-rays for ER showed arthritis.  Tramadol they gave her and the Diclofenac gel has helped some. Appointment scheduled with Dr. Lorelei Pont 10/20/2013 @ 11:30 for knee injection.

## 2013-10-16 NOTE — Telephone Encounter (Signed)
i am seeing her on Monday. I know essentially every orthopedist in [place] and [place] counties, and i would be happy to discuss with her on Monday.

## 2013-10-17 ENCOUNTER — Telehealth: Payer: Self-pay | Admitting: Family Medicine

## 2013-10-17 DIAGNOSIS — M1712 Unilateral primary osteoarthritis, left knee: Secondary | ICD-10-CM

## 2013-10-17 DIAGNOSIS — M25562 Pain in left knee: Secondary | ICD-10-CM

## 2013-10-17 DIAGNOSIS — M1710 Unilateral primary osteoarthritis, unspecified knee: Secondary | ICD-10-CM | POA: Diagnosis not present

## 2013-10-17 NOTE — Telephone Encounter (Signed)
Appointment with Dr. Lorelei Pont for 10/20/2013 cancelled.

## 2013-10-17 NOTE — Telephone Encounter (Signed)
Patient called.  She has an appointment with Dr.Copland on Monday for an injection.  Patient said she is in a lot of pain in the back of her hip and leg at night. She said it doesn't bother her during the day.  Patient said she can't wait until Monday for treatment.  Patient said [name] told her to follow up with Dr.Xu three days after she was seen at [facility].  She takes Tramadol at night and put Voltaren on leg.  Patient wants to know if you can suggest something, so she can sleep tonight.  Patient would like to be referred to Dr.Xu, if she can get in today.

## 2013-10-17 NOTE — Telephone Encounter (Signed)
Cancel appt with Dr. Loletha Grayer. Will send in referral to any ortho ASAP, preference Dr. Erlinda Hong.  She can increase tramadol to 2 tabs at bedtime, she can use 1 tab every 6 hours prior to that.

## 2013-10-17 NOTE — Addendum Note (Signed)
Addended byEliezer Lofts E on: 10/17/2013 09:00 AM   Modules accepted: Orders

## 2013-10-20 ENCOUNTER — Ambulatory Visit: Payer: Medicare Other | Admitting: Family Medicine

## 2013-10-20 NOTE — Telephone Encounter (Signed)
Pt saw Dr Oneita Kras on 10/17/13 and received shot in knee; pt said pain is starting to worsen and request referral ASAP with Dr Reynaldo Minium if appt is not too long away or with Dr Erlinda Hong. [name] pt care coordinator will contact pt with appt.

## 2013-10-21 NOTE — Telephone Encounter (Signed)
Appt has been cancelled with Dr Erlinda Hong, patient is aware.

## 2013-10-21 NOTE — Telephone Encounter (Signed)
Pt does have appt with Dr. Reynaldo Minium tomorrow and needs to cancel 10/28/13 with Dr. Erlinda Hong.  Please call her with any questions.

## 2013-10-22 DIAGNOSIS — IMO0002 Reserved for concepts with insufficient information to code with codable children: Secondary | ICD-10-CM | POA: Diagnosis not present

## 2013-10-22 DIAGNOSIS — M1710 Unilateral primary osteoarthritis, unspecified knee: Secondary | ICD-10-CM | POA: Diagnosis not present

## 2013-11-06 ENCOUNTER — Other Ambulatory Visit: Payer: Self-pay | Admitting: Cardiovascular Disease

## 2013-11-10 ENCOUNTER — Other Ambulatory Visit: Payer: Self-pay | Admitting: Cardiovascular Disease

## 2013-11-15 ENCOUNTER — Encounter (HOSPITAL_COMMUNITY): Payer: Self-pay | Admitting: Emergency Medicine

## 2013-11-15 ENCOUNTER — Emergency Department (HOSPITAL_COMMUNITY)
Admission: EM | Admit: 2013-11-15 | Discharge: 2013-11-15 | Disposition: A | Discharge status: Home / Self Care | Payer: Medicare Other | Attending: Emergency Medicine | Admitting: Emergency Medicine

## 2013-11-15 ENCOUNTER — Emergency Department (HOSPITAL_COMMUNITY): Payer: Medicare Other

## 2013-11-15 DIAGNOSIS — Z791 Long term (current) use of non-steroidal anti-inflammatories (NSAID): Secondary | ICD-10-CM | POA: Diagnosis not present

## 2013-11-15 DIAGNOSIS — IMO0002 Reserved for concepts with insufficient information to code with codable children: Secondary | ICD-10-CM | POA: Diagnosis not present

## 2013-11-15 DIAGNOSIS — S0003XA Contusion of scalp, initial encounter: Secondary | ICD-10-CM | POA: Insufficient documentation

## 2013-11-15 DIAGNOSIS — Z87728 Personal history of other specified (corrected) congenital malformations of nervous system and sense organs: Secondary | ICD-10-CM | POA: Insufficient documentation

## 2013-11-15 DIAGNOSIS — I10 Essential (primary) hypertension: Secondary | ICD-10-CM | POA: Insufficient documentation

## 2013-11-15 DIAGNOSIS — S0993XA Unspecified injury of face, initial encounter: Secondary | ICD-10-CM | POA: Diagnosis not present

## 2013-11-15 DIAGNOSIS — E119 Type 2 diabetes mellitus without complications: Secondary | ICD-10-CM | POA: Diagnosis not present

## 2013-11-15 DIAGNOSIS — S0083XA Contusion of other part of head, initial encounter: Principal | ICD-10-CM | POA: Insufficient documentation

## 2013-11-15 DIAGNOSIS — Z7982 Long term (current) use of aspirin: Secondary | ICD-10-CM | POA: Diagnosis not present

## 2013-11-15 DIAGNOSIS — W1809XA Striking against other object with subsequent fall, initial encounter: Secondary | ICD-10-CM | POA: Insufficient documentation

## 2013-11-15 DIAGNOSIS — Z8742 Personal history of other diseases of the female genital tract: Secondary | ICD-10-CM | POA: Diagnosis not present

## 2013-11-15 DIAGNOSIS — Z8774 Personal history of (corrected) congenital malformations of heart and circulatory system: Secondary | ICD-10-CM | POA: Diagnosis not present

## 2013-11-15 DIAGNOSIS — Z8709 Personal history of other diseases of the respiratory system: Secondary | ICD-10-CM | POA: Insufficient documentation

## 2013-11-15 DIAGNOSIS — M1990 Unspecified osteoarthritis, unspecified site: Secondary | ICD-10-CM | POA: Insufficient documentation

## 2013-11-15 DIAGNOSIS — Y9301 Activity, walking, marching and hiking: Secondary | ICD-10-CM | POA: Diagnosis not present

## 2013-11-15 DIAGNOSIS — S0990XA Unspecified injury of head, initial encounter: Secondary | ICD-10-CM | POA: Insufficient documentation

## 2013-11-15 DIAGNOSIS — Z8719 Personal history of other diseases of the digestive system: Secondary | ICD-10-CM | POA: Diagnosis not present

## 2013-11-15 DIAGNOSIS — S1093XA Contusion of unspecified part of neck, initial encounter: Secondary | ICD-10-CM | POA: Diagnosis not present

## 2013-11-15 DIAGNOSIS — E785 Hyperlipidemia, unspecified: Secondary | ICD-10-CM | POA: Insufficient documentation

## 2013-11-15 DIAGNOSIS — M109 Gout, unspecified: Secondary | ICD-10-CM | POA: Diagnosis not present

## 2013-11-15 DIAGNOSIS — Y9289 Other specified places as the place of occurrence of the external cause: Secondary | ICD-10-CM | POA: Diagnosis not present

## 2013-11-15 DIAGNOSIS — Z87891 Personal history of nicotine dependence: Secondary | ICD-10-CM | POA: Insufficient documentation

## 2013-11-15 DIAGNOSIS — S199XXA Unspecified injury of neck, initial encounter: Secondary | ICD-10-CM | POA: Diagnosis not present

## 2013-11-15 NOTE — ED Notes (Signed)
Pt in stating she was walking outside and tripped and hit her head, denies LOC but states she laid down on the ground for a few minutes after ward, c/o headache and tenderness to forehead, no vomiting or dizziness, denies neck pain

## 2013-11-15 NOTE — ED Provider Notes (Signed)
Patient seen and evaluated. Discussed with Dr. Silvio Clayman.  I agree with his assessment.  Studies negative.  Pt appropriate for DC.  On exam, pt with ecccymosis to forehead. No midline neck pain.  Normal neurological exam.   Tanna Furry, MD 11/15/13 [phone]

## 2013-11-15 NOTE — ED Notes (Signed)
Swelling noted to forehead with minimal bruising. Pt reports "feeling better now than when I got here." Daughter at bedside. Pt waiting for EDP

## 2013-11-15 NOTE — ED Provider Notes (Signed)
CSN: [id number]     Arrival date & time 11/15/13  1218 History   First MD Initiated Contact with Patient 11/15/13 1559     Chief Complaint  Patient presents with  . Head Injury     (Consider location/radiation/quality/duration/timing/severity/associated sxs/prior Treatment) Patient is a 80 y.o. female presenting with head injury.  Head Injury Location:  Frontal Mechanism of injury: direct blow and fall   Pain details:    Quality:  Aching   Severity:  Mild   Timing:  Constant   Progression:  Partially resolved Chronicity:  New Relieved by:  Nothing Associated symptoms: headache   Associated symptoms: no blurred vision, no difficulty breathing, no disorientation, no double vision, no nausea, no neck pain, no numbness and no vomiting     Past Medical History  Diagnosis Date  . Hyperlipidemia   . Hypertension   . Diabetes mellitus     Type II  . GERD (gastroesophageal reflux disease)   . Allergy   . Syncope and collapse     1 episode  . Orthostatic hypotension 04/2006    [facility]ospital  . Palpitations   . Acute pharyngitis   . Other abscess of vulva   . Acute sinusitis, unspecified   . Acute upper respiratory infections of unspecified site   . Hemangioma of unspecified site   . Anal and rectal polyp   . Cataract     History of  . Osteopenia   . Osteoarthritis   . Gout   . Cavernous angioma   . Degenerative cervical disc 02/2006   Past Surgical History  Procedure Laterality Date  . Abdominal hysterectomy    . Cataract extraction    . Cavernous hemangioma  04/2004    Hospital  . Cavernous angioma    . Aortic dopplers      Mild plaque.  EEG okay  . Total knee arthroplasty      Right   Family History  Problem Relation Age of Onset  . Cancer Maternal Aunt     Breast CA  . Heart disease Other     CHF   History  Substance Use Topics  . Smoking status: Former Research scientist (life sciences)  . Smokeless tobacco: Never Used     Comment: quit 1970  . Alcohol Use: Yes     Comment: wine  occassionally   OB History   Grav Para Term Preterm Abortions TAB SAB Ect Mult Living                 Review of Systems  Constitutional: Negative for diaphoresis.  HENT: Negative for dental problem and ear pain.   Eyes: Negative for blurred vision, double vision and pain.  Respiratory: Negative for choking and chest tightness.   Cardiovascular: Negative for chest pain.  Gastrointestinal: Negative for nausea, vomiting and abdominal pain.  Genitourinary: Negative for vaginal pain and pelvic pain.  Musculoskeletal: Negative for back pain and neck pain.  Skin: Negative for wound.  Neurological: Positive for headaches. Negative for light-headedness and numbness.      Allergies  Atorvastatin; Codeine; and Rosuvastatin  Home Medications   Prior to Admission medications   Medication Sig Start Date End Date Taking? Authorizing Provider  aspirin 325 MG tablet Take 162 mg by mouth daily.     Historical Provider, MD  Calcium Citrate (CITRACAL PO) Take 1 capsule by mouth daily.     Historical Provider, MD  diclofenac sodium (VOLTAREN) 1 % GEL Apply 4 g topically 4 (four) times daily. 10/14/13  Amy Cletis Athens, MD  fish oil-omega-3 fatty acids 1000 MG capsule Take 1 g by mouth daily.      Historical Provider, MD  fluticasone (FLONASE) 50 MCG/ACT nasal spray Place 2 sprays into both nostrils daily. 03/07/13   Amy Cletis Athens, MD  glucose blood (FREESTYLE TEST STRIPS) test strip Use to check blood sugar 1-2 times per day. Dx code 250.00 non insulin dependent. 06/09/13   Amy Cletis Athens, MD  ipratropium (ATROVENT) 0.06 % nasal spray Place 2 sprays into both nostrils 4 (four) times daily. 06/10/13   Billy Fischer, MD  ketoconazole (NIZORAL) 2 % cream Apply topically daily. 03/07/13   Amy Cletis Athens, MD  Magnesium Hydroxide (PHILLIPS MILK OF MAGNESIA PO) Take 1 tablet by mouth daily. 3-4 times per week     Historical Provider, MD  meloxicam (MOBIC) 7.5 MG tablet Take 1-2 tablets (7.5-15 mg total) by mouth  daily. 10/14/13   Amy Cletis Athens, MD  Multiple Vitamin (MULTIVITAMIN) tablet Take 1 tablet by mouth daily.      Historical Provider, MD  ramipril (ALTACE) 5 MG capsule Take 5 mg by mouth daily.    Historical Provider, MD  ramipril (ALTACE) 5 MG capsule TAKE ONE CAPSULE BY MOUTH EVERY DAY 11/06/13   Wellington Hampshire, MD  ramipril (ALTACE) 5 MG capsule TAKE ONE CAPSULE BY MOUTH EVERY DAY 11/10/13   Wellington Hampshire, MD  simvastatin (ZOCOR) 40 MG tablet Take 40 mg by mouth daily.    Historical Provider, MD  traMADol (ULTRAM) 50 MG tablet Take 1 tablet (50 mg total) by mouth every 6 (six) hours as needed. 10/15/13   Osvaldo Shipper, MD   BP 176/73  Pulse 70  Temp(Src) 98.1 F (36.7 C) (Oral)  Resp 16  SpO2 99% Physical Exam  Constitutional: She is oriented to person, place, and time. She appears well-developed and well-nourished. No distress.  HENT:  Head: Normocephalic. Head is with contusion.  Eyes: Pupils are equal, round, and reactive to light. Right eye exhibits no discharge. Left eye exhibits no discharge.  Neck: Normal range of motion.  Cardiovascular: Normal rate, regular rhythm and normal heart sounds.   Pulmonary/Chest: Effort normal and breath sounds normal.  Abdominal: Soft. She exhibits no distension. There is no tenderness.  Musculoskeletal: Normal range of motion.  Neurological: She is alert and oriented to person, place, and time. She displays no atrophy. No cranial nerve deficit or sensory deficit. She exhibits normal muscle tone. GCS eye subscore is 4. GCS verbal subscore is 5. GCS motor subscore is 6.  Skin: Skin is warm. She is not diaphoretic.    ED Course  Procedures (including critical care time) Labs Review Labs Reviewed - No data to display  Imaging Review Ct Head Wo Contrast  11/15/2013   CLINICAL DATA:  HEAD INJURY Post fall  EXAM: CT HEAD WITHOUT CONTRAST  CT CERVICAL SPINE WITHOUT CONTRAST  TECHNIQUE: Multidetector CT imaging of the head and cervical spine  was performed following the standard protocol without intravenous contrast. Multiplanar CT image reconstructions of the cervical spine were also generated.  COMPARISON:  02/13/2009  FINDINGS: CT HEAD FINDINGS  Atherosclerotic and physiologic intracranial calcifications. Progressive coarse calcification in the medulla to the right of midline. Mild atrophy. There is no evidence of acute intracranial hemorrhage, brain edema, mass lesion, acute infarction, mass effect, or midline shift. Acute infarct may be inapparent on noncontrast CT. No other intra-axial abnormalities are seen, and the ventricles and sulci are within normal  limits in size and symmetry. No abnormal extra-axial fluid collections or masses are identified. No significant calvarial abnormality.  CT CERVICAL SPINE FINDINGS  Normal alignment. No prevertebral soft tissue swelling. Mild narrowing of interspaces C2-C6 with small endplate spurs and disc bulges. Facets are seated with asymmetric DJD right C3-4, left C4-7 with foraminal encroachment. Negative for fracture. Visualized lung apices clear. Bilateral calcified carotid bifurcation plaque.  IMPRESSION: 1. Negative for bleed or other acute intracranial process. 2. Negative for cervical fracture or other acute abnormality. 3. Multilevel spondylitic changes as above.   Electronically Signed   By: Arne Cleveland M.D.   On: 11/15/2013 14:07   Ct Cervical Spine Wo Contrast  11/15/2013   CLINICAL DATA:  HEAD INJURY Post fall  EXAM: CT HEAD WITHOUT CONTRAST  CT CERVICAL SPINE WITHOUT CONTRAST  TECHNIQUE: Multidetector CT imaging of the head and cervical spine was performed following the standard protocol without intravenous contrast. Multiplanar CT image reconstructions of the cervical spine were also generated.  COMPARISON:  02/13/2009  FINDINGS: CT HEAD FINDINGS  Atherosclerotic and physiologic intracranial calcifications. Progressive coarse calcification in the medulla to the right of midline. Mild  atrophy. There is no evidence of acute intracranial hemorrhage, brain edema, mass lesion, acute infarction, mass effect, or midline shift. Acute infarct may be inapparent on noncontrast CT. No other intra-axial abnormalities are seen, and the ventricles and sulci are within normal limits in size and symmetry. No abnormal extra-axial fluid collections or masses are identified. No significant calvarial abnormality.  CT CERVICAL SPINE FINDINGS  Normal alignment. No prevertebral soft tissue swelling. Mild narrowing of interspaces C2-C6 with small endplate spurs and disc bulges. Facets are seated with asymmetric DJD right C3-4, left C4-7 with foraminal encroachment. Negative for fracture. Visualized lung apices clear. Bilateral calcified carotid bifurcation plaque.  IMPRESSION: 1. Negative for bleed or other acute intracranial process. 2. Negative for cervical fracture or other acute abnormality. 3. Multilevel spondylitic changes as above.   Electronically Signed   By: Arne Cleveland M.D.   On: 11/15/2013 14:07     EKG Interpretation None      MDM   Final diagnoses:  None   80 year old female with a history of hyperlipidemia, hypertension, diabetes presents as a mechanical fall from standing with minor trauma to her for head.  On arrival the patient is hemodynamically stable, appears in no acute distress. Her exam is normal as documented above except for a hematoma of the left frontal bone. Plan is to obtain a CT scan to rule out intracranial bleed. Prior to scan, patient without significant headache, without nausea, vomiting, weakness, numbness.   CT scan with no intracranial abnormalities. CT cervical spine demonstrates no acute abnormalities. Patient still without significant abnormalities. Patient well appearing. Stable for discharge. Strict return precautions given. Patient seen and evaluated by myself and my attending, Dr. Jeneen Rinks.      Freddi Che, MD 11/16/13 [phone]

## 2013-11-18 ENCOUNTER — Encounter: Payer: Self-pay | Admitting: Internal Medicine

## 2013-11-18 ENCOUNTER — Ambulatory Visit (INDEPENDENT_AMBULATORY_CARE_PROVIDER_SITE_OTHER): Payer: Medicare Other | Admitting: Internal Medicine

## 2013-11-18 VITALS — BP 122/60 | HR 78 | Temp 97.8°F | Wt 140.0 lb

## 2013-11-18 DIAGNOSIS — T148XXA Other injury of unspecified body region, initial encounter: Secondary | ICD-10-CM | POA: Diagnosis not present

## 2013-11-18 DIAGNOSIS — W19XXXA Unspecified fall, initial encounter: Secondary | ICD-10-CM

## 2013-11-18 DIAGNOSIS — Y92009 Unspecified place in unspecified non-institutional (private) residence as the place of occurrence of the external cause: Secondary | ICD-10-CM

## 2013-11-18 NOTE — Progress Notes (Signed)
Subjective:    Patient ID: [name], female    DOB: [birth year], 80 y.o.   MRN: [id number]  HPI  Pt presents to the clinic today s/p a fall that occurred 3 days ago. She reports that she hit her head on the outside of her brick house. She did go to the ER on that day. CT of the head and cervical spine were negative for fracture. However, she now notices that she has pain in between her shoulder blades. She reports that it is sore. It seems worse with movement. She has tried tylenol with some relief.  Review of Systems      Past Medical History  Diagnosis Date  . Hyperlipidemia   . Hypertension   . Diabetes mellitus     Type II  . GERD (gastroesophageal reflux disease)   . Allergy   . Syncope and collapse     1 episode  . Orthostatic hypotension 04/2006    Hospital  . Palpitations   . Acute pharyngitis   . Other abscess of vulva   . Acute sinusitis, unspecified   . Acute upper respiratory infections of unspecified site   . Hemangioma of unspecified site   . Anal and rectal polyp   . Cataract     History of  . Osteopenia   . Osteoarthritis   . Gout   . Cavernous angioma   . Degenerative cervical disc 02/2006    Current Outpatient Prescriptions  Medication Sig Dispense Refill  . aspirin 325 MG tablet Take 162 mg by mouth daily.       . Calcium Citrate (CITRACAL PO) Take 1 capsule by mouth daily.       . diclofenac sodium (VOLTAREN) 1 % GEL Apply 4 g topically 4 (four) times daily.  100 g  0  . fish oil-omega-3 fatty acids 1000 MG capsule Take 1 g by mouth daily.        . fluticasone (FLONASE) 50 MCG/ACT nasal spray Place 2 sprays into both nostrils daily.  16 g  6  . glucose blood (FREESTYLE TEST STRIPS) test strip Use to check blood sugar 1-2 times per day. Dx code 250.00 non insulin dependent.  100 each  3  . ipratropium (ATROVENT) 0.06 % nasal spray Place 2 sprays into both nostrils 4 (four) times daily.  15 mL  1  . ketoconazole (NIZORAL) 2 % cream Apply  topically daily.  30 g  0  . Magnesium Hydroxide (PHILLIPS MILK OF MAGNESIA PO) Take 1 tablet by mouth daily. 3-4 times per week       . meloxicam (MOBIC) 7.5 MG tablet Take 1-2 tablets (7.5-15 mg total) by mouth daily.  30 tablet  0  . Multiple Vitamin (MULTIVITAMIN) tablet Take 1 tablet by mouth daily.        . ramipril (ALTACE) 5 MG capsule TAKE ONE CAPSULE BY MOUTH EVERY DAY  30 capsule  3  . simvastatin (ZOCOR) 40 MG tablet Take 40 mg by mouth daily.      . traMADol (ULTRAM) 50 MG tablet Take 1 tablet (50 mg total) by mouth every 6 (six) hours as needed.  30 tablet  0   No current facility-administered medications for this visit.    Allergies  Allergen Reactions  . Atorvastatin     REACTION: intolerant  . Codeine     REACTION: GI upset  . Rosuvastatin     REACTION: intolerant    Family History  Problem Relation Age of  Onset  . Cancer Maternal Aunt     Breast CA  . Heart disease Other     CHF    History   Social History  . Marital Status: Married    Spouse Name: N/A    Number of Children: 3  . Years of Education: N/A   Occupational History  . 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 Smoking status: Former Research scientist (life sciences)  . Smokeless tobacco: Never Used     Comment: quit 1970  . Alcohol Use: Yes     Comment: wine occassionally  . Drug Use: No  . Sexual Activity: Not on file   Other Topics Concern  . Not on file   Social History Narrative   Diet: fruit and veggies, water, lean protein.   Activity limited secondary to knee discomfort/arthritis, but going to church exersice group two times a week.    Has living will, full code. HCPOA: [name] (reviewed 2014)                 Constitutional: Denies fever, malaise, fatigue, headache or abrupt weight changes.  Respiratory: Denies difficulty breathing, shortness of breath, cough or sputum production.   Cardiovascular: Denies chest pain, chest tightness,  palpitations or swelling in the hands or feet.  Musculoskeletal: Pt reports back pain. Denies difficulty with gait, joint swelling.    No other specific complaints in a complete review of systems (except as listed in HPI above).  Objective:   Physical Exam   BP 122/60  Pulse 78  Temp(Src) 97.8 F (36.6 C) (Oral)  Wt 140 lb (63.504 kg)  SpO2 98% Wt Readings from Last 3 Encounters:  11/18/13 140 lb (63.504 kg)  10/15/13 143 lb 8 oz (65.091 kg)  10/14/13 143 lb 8 oz (65.091 kg)    General: Appears her stated age, well developed, well nourished in NAD. Cardiovascular: Normal rate and rhythm. S1,S2 noted.  No murmur, rubs or gallops noted. No JVD or BLE edema. No carotid bruits noted. Pulmonary/Chest: Normal effort and positive vesicular breath sounds. No respiratory distress. No wheezes, rales or ronchi noted.  Musculoskeletal: Normal internal and external ROM of bilateral shoulders. Normal flexion and extension of cervical spine. Pain with palpation of the trapezius muscle.   BMET    Component Value Date/Time   NA 141 09/30/2013 0813   K 4.1 09/30/2013 0813   CL 108 09/30/2013 0813   CO2 27 09/30/2013 0813   GLUCOSE 115* 09/30/2013 0813   BUN 18 09/30/2013 0813   CREATININE 1.0 09/30/2013 0813   CALCIUM 9.2 09/30/2013 0813   GFRNONAA 46* 04/10/2013 1951   GFRAA 53* 04/10/2013 1951    Lipid Panel     Component Value Date/Time   CHOL 143 03/04/2013 0811   TRIG 77.0 03/04/2013 0811   HDL 55.20 03/04/2013 0811   CHOLHDL 3 03/04/2013 0811   VLDL 15.4 03/04/2013 0811   LDLCALC 72 03/04/2013 0811    CBC    Component Value Date/Time   WBC 10.6* 04/10/2013 1951   RBC 4.33 04/10/2013 1951   HGB 15.0 06/10/2013 1331   HCT 44.0 06/10/2013 1331   PLT 240 04/10/2013 1951   MCV 92.6 04/10/2013 1951   MCH 30.7 04/10/2013 1951   MCHC 33.2 04/10/2013 1951   RDW 13.7 04/10/2013 1951   LYMPHSABS 2.0 04/10/2013 1951   MONOABS 0.8 04/10/2013 1951   EOSABS 0.1 04/10/2013 1951   BASOSABS 0.0  04/10/2013 1951  Hgb A1C Lab Results  Component Value Date   HGBA1C 7.1* 06/11/2013        Assessment & Plan:   Muscle strain:  Try tramadol and a heating pad I don't think we need any further imaging  RTC as needed or if pain persist or worsen

## 2013-11-18 NOTE — Patient Instructions (Addendum)

## 2013-11-18 NOTE — Progress Notes (Signed)
Pre visit review using our clinic review tool, if applicable. No additional management support is needed unless otherwise documented below in the visit note. 

## 2013-11-21 ENCOUNTER — Emergency Department (HOSPITAL_COMMUNITY)
Admission: EM | Admit: 2013-11-21 | Discharge: 2013-11-21 | Disposition: A | Discharge status: Home / Self Care | Payer: Medicare Other | Attending: Emergency Medicine | Admitting: Emergency Medicine

## 2013-11-21 ENCOUNTER — Emergency Department (HOSPITAL_COMMUNITY): Payer: Medicare Other

## 2013-11-21 ENCOUNTER — Encounter (HOSPITAL_COMMUNITY): Payer: Self-pay | Admitting: Emergency Medicine

## 2013-11-21 DIAGNOSIS — R42 Dizziness and giddiness: Secondary | ICD-10-CM | POA: Diagnosis not present

## 2013-11-21 DIAGNOSIS — E119 Type 2 diabetes mellitus without complications: Secondary | ICD-10-CM | POA: Diagnosis not present

## 2013-11-21 DIAGNOSIS — Z8669 Personal history of other diseases of the nervous system and sense organs: Secondary | ICD-10-CM | POA: Diagnosis not present

## 2013-11-21 DIAGNOSIS — I10 Essential (primary) hypertension: Secondary | ICD-10-CM | POA: Diagnosis not present

## 2013-11-21 DIAGNOSIS — IMO0002 Reserved for concepts with insufficient information to code with codable children: Secondary | ICD-10-CM | POA: Diagnosis not present

## 2013-11-21 DIAGNOSIS — Z8719 Personal history of other diseases of the digestive system: Secondary | ICD-10-CM | POA: Diagnosis not present

## 2013-11-21 DIAGNOSIS — Z79899 Other long term (current) drug therapy: Secondary | ICD-10-CM | POA: Diagnosis not present

## 2013-11-21 DIAGNOSIS — Z8709 Personal history of other diseases of the respiratory system: Secondary | ICD-10-CM | POA: Diagnosis not present

## 2013-11-21 DIAGNOSIS — N390 Urinary tract infection, site not specified: Secondary | ICD-10-CM | POA: Insufficient documentation

## 2013-11-21 DIAGNOSIS — E785 Hyperlipidemia, unspecified: Secondary | ICD-10-CM | POA: Insufficient documentation

## 2013-11-21 DIAGNOSIS — M1990 Unspecified osteoarthritis, unspecified site: Secondary | ICD-10-CM | POA: Diagnosis not present

## 2013-11-21 DIAGNOSIS — Z87891 Personal history of nicotine dependence: Secondary | ICD-10-CM | POA: Insufficient documentation

## 2013-11-21 DIAGNOSIS — G40909 Epilepsy, unspecified, not intractable, without status epilepticus: Secondary | ICD-10-CM | POA: Diagnosis not present

## 2013-11-21 DIAGNOSIS — Z7982 Long term (current) use of aspirin: Secondary | ICD-10-CM | POA: Insufficient documentation

## 2013-11-21 LAB — COMPREHENSIVE METABOLIC PANEL
ALT: 17 U/L (ref 0–35)
ANION GAP: 13 (ref 5–15)
AST: 22 U/L (ref 0–37)
Albumin: 3.6 g/dL (ref 3.5–5.2)
Alkaline Phosphatase: 49 U/L (ref 39–117)
BILIRUBIN TOTAL: 0.5 mg/dL (ref 0.3–1.2)
BUN: 18 mg/dL (ref 6–23)
CALCIUM: 9.2 mg/dL (ref 8.4–10.5)
CHLORIDE: 104 meq/L (ref 96–112)
CO2: 22 mEq/L (ref 19–32)
CREATININE: 0.88 mg/dL (ref 0.50–1.10)
GFR calc Af Amer: 66 mL/min — ABNORMAL LOW (ref >90)
GFR calc non Af Amer: 57 mL/min — ABNORMAL LOW (ref >90)
Glucose, Bld: 153 mg/dL — ABNORMAL HIGH (ref 70–99)
Potassium: 4.4 mEq/L (ref 3.7–5.3)
Sodium: 139 mEq/L (ref 137–147)
Total Protein: 7.3 g/dL (ref 6.0–8.3)

## 2013-11-21 LAB — CBC WITH DIFFERENTIAL/PLATELET
BASOS ABS: 0 10*3/uL (ref 0.0–0.1)
Basophils Relative: 0 % (ref 0–1)
EOS PCT: 1 % (ref 0–5)
Eosinophils Absolute: 0.1 10*3/uL (ref 0.0–0.7)
HEMATOCRIT: 42.5 % (ref 36.0–46.0)
HEMOGLOBIN: 14.1 g/dL (ref 12.0–15.0)
Lymphocytes Relative: 9 % — ABNORMAL LOW (ref 12–46)
Lymphs Abs: 1.3 10*3/uL (ref 0.7–4.0)
MCH: 31.5 pg (ref 26.0–34.0)
MCHC: 33.2 g/dL (ref 30.0–36.0)
MCV: 94.9 fL (ref 78.0–100.0)
MONO ABS: 0.7 10*3/uL (ref 0.1–1.0)
MONOS PCT: 5 % (ref 3–12)
NEUTROS ABS: 13.1 10*3/uL — AB (ref 1.7–7.7)
Neutrophils Relative %: 85 % — ABNORMAL HIGH (ref 43–77)
Platelets: 252 10*3/uL (ref 150–400)
RBC: 4.48 MIL/uL (ref 3.87–5.11)
RDW: 14.1 % (ref 11.5–15.5)
WBC: 15.1 10*3/uL — ABNORMAL HIGH (ref 4.0–10.5)

## 2013-11-21 LAB — URINALYSIS, ROUTINE W REFLEX MICROSCOPIC
Bilirubin Urine: NEGATIVE
GLUCOSE, UA: NEGATIVE mg/dL
Ketones, ur: NEGATIVE mg/dL
Nitrite: NEGATIVE
PH: 6 (ref 5.0–8.0)
Protein, ur: NEGATIVE mg/dL
Specific Gravity, Urine: 1.019 (ref 1.005–1.030)
Urobilinogen, UA: 0.2 mg/dL (ref 0.0–1.0)

## 2013-11-21 LAB — CBG MONITORING, ED: GLUCOSE-CAPILLARY: 150 mg/dL — AB (ref 70–99)

## 2013-11-21 LAB — URINE MICROSCOPIC-ADD ON

## 2013-11-21 LAB — TROPONIN I

## 2013-11-21 MED ORDER — SODIUM CHLORIDE 0.9 % IV BOLUS (SEPSIS)
500.0000 mL | INTRAVENOUS | Status: AC
  Administered 2013-11-21: 500 mL via INTRAVENOUS

## 2013-11-21 MED ORDER — CEPHALEXIN 500 MG PO CAPS: 500.0000 mg | ORAL_CAPSULE | Freq: Four times a day (QID) | ORAL | Status: AC

## 2013-11-21 MED ORDER — DEXTROSE 5 % IV SOLN
1.0000 g | Freq: Once | INTRAVENOUS | Status: AC
  Administered 2013-11-21: 1 g via INTRAVENOUS
  Filled 2013-11-21: qty 10

## 2013-11-21 NOTE — ED Provider Notes (Signed)
CSN: [id number]     Arrival date & time 11/21/13  3419 History   First MD Initiated Contact with Patient 11/21/13 [phone]     Chief Complaint  Patient presents with  . Dizziness     (Consider location/radiation/quality/duration/timing/severity/associated sxs/prior Treatment) Patient is a 80 y.o. female presenting with dizziness. The history is provided by the patient.  Dizziness Quality:  Room spinning Severity:  Mild Onset quality:  Sudden Duration:  4 hours Timing:  Constant Progression:  Improving Chronicity:  New Context comment:  Upon awakening Relieved by:  Nothing Worsened by:  Nothing tried Ineffective treatments:  None tried Associated symptoms: no chest pain, no diarrhea, no headaches, no nausea, no shortness of breath and no vomiting     Past Medical History  Diagnosis Date  . Hyperlipidemia   . Hypertension   . Diabetes mellitus     Type II  . GERD (gastroesophageal reflux disease)   . Allergy   . Syncope and collapse     1 episode  . Orthostatic hypotension 04/2006    [facility]ospital  . Palpitations   . Acute pharyngitis   . Other abscess of vulva   . Acute sinusitis, unspecified   . Acute upper respiratory infections of unspecified site   . Hemangioma of unspecified site   . Anal and rectal polyp   . Cataract     History of  . Osteopenia   . Osteoarthritis   . Gout   . Cavernous angioma   . Degenerative cervical disc 02/2006   Past Surgical History  Procedure Laterality Date  . Abdominal hysterectomy    . Cataract extraction    . Cavernous hemangioma  04/2004    Hospital  . Cavernous angioma    . Aortic dopplers      Mild plaque.  EEG okay  . Total knee arthroplasty      Right   Family History  Problem Relation Age of Onset  . Cancer Maternal Aunt     Breast CA  . Heart disease Other     CHF   History  Substance Use Topics  . Smoking status: Former Research scientist (life sciences)  . Smokeless tobacco: Never Used     Comment: quit 1970  . Alcohol Use: Yes      Comment: wine occassionally   OB History   Grav Para Term Preterm Abortions TAB SAB Ect Mult Living                 Review of Systems  Constitutional: Negative for fever and fatigue.  HENT: Negative for congestion and drooling.   Eyes: Negative for pain.  Respiratory: Negative for cough and shortness of breath.   Cardiovascular: Negative for chest pain.  Gastrointestinal: Negative for nausea, vomiting, abdominal pain and diarrhea.  Genitourinary: Negative for dysuria and hematuria.  Musculoskeletal: Negative for back pain, gait problem and neck pain.  Skin: Negative for color change.  Neurological: Positive for dizziness. Negative for headaches.  Hematological: Negative for adenopathy.  Psychiatric/Behavioral: Negative for behavioral problems.  All other systems reviewed and are negative.     Allergies  Atorvastatin; Codeine; and Rosuvastatin  Home Medications   Prior to Admission medications   Medication Sig Start Date End Date Taking? Authorizing Provider  acetaminophen (TYLENOL) 325 MG tablet Take 650 mg by mouth every 6 (six) hours as needed for mild pain, moderate pain or fever.   Yes Historical Provider, MD  aspirin EC 81 MG tablet Take 81 mg by mouth daily.   Yes  Historical Provider, MD  Calcium Citrate (CITRACAL PO) Take 1 capsule by mouth daily.    Yes Historical Provider, MD  diclofenac sodium (VOLTAREN) 1 % GEL Apply 2 g topically 2 (two) times daily as needed (for pain).   Yes Historical Provider, MD  fish oil-omega-3 fatty acids 1000 MG capsule Take 1 g by mouth daily.     Yes Historical Provider, MD  fluticasone (FLONASE) 50 MCG/ACT nasal spray Place 2 sprays into both nostrils daily. 03/07/13  Yes Amy E Bedsole, MD  glucose blood (FREESTYLE TEST STRIPS) test strip Use to check blood sugar 1-2 times per day. Dx code 250.00 non insulin dependent. 06/09/13  Yes Amy Cletis [place], MD  ketoconazole (NIZORAL) 2 % cream Apply topically daily. 03/07/13  Yes Amy Cletis [place], MD   magnesium hydroxide (PHILLIPS CHEWS) 311 MG CHEW chewable tablet Chew 311 mg by mouth daily as needed (for constipation).   Yes Historical Provider, MD  Multiple Vitamin (MULTIVITAMIN) tablet Take 1 tablet by mouth daily.     Yes Historical Provider, MD  Polyethyl Glycol-Propyl Glycol (SYSTANE OP) Apply 1 drop to eye daily as needed (for dry eyes).   Yes Historical Provider, MD  ramipril (ALTACE) 5 MG capsule Take 5 mg by mouth daily.   Yes Historical Provider, MD  simvastatin (ZOCOR) 40 MG tablet Take 40 mg by mouth daily.   Yes Historical Provider, MD  traMADol (ULTRAM) 50 MG tablet Take 1 tablet (50 mg total) by mouth every 6 (six) hours as needed. 10/15/13  Yes Osvaldo Shipper, MD   BP 166/69  Pulse 69  Temp(Src) 97.6 F (36.4 C) (Oral)  Resp 18  SpO2 99% Physical Exam  Nursing note and vitals reviewed. Constitutional: She is oriented to person, place, and time. She appears well-developed and well-nourished.  HENT:  Head: Normocephalic.  Mouth/Throat: Oropharynx is clear and moist. No oropharyngeal exudate.  Tm's clear bilaterally.   Mild old-appearing ecchymosis to left forehead.  Eyes: Conjunctivae and EOM are normal. Pupils are equal, round, and reactive to light.  Neck: Normal range of motion. Neck supple.  No cervical ttp.   Cardiovascular: Normal rate, regular rhythm, normal heart sounds and intact distal pulses.  Exam reveals no gallop and no friction rub.   No murmur heard. Pulmonary/Chest: Effort normal and breath sounds normal. No respiratory distress. She has no wheezes.  Abdominal: Soft. Bowel sounds are normal. There is no tenderness. There is no rebound and no guarding.  Musculoskeletal: Normal range of motion. She exhibits no edema and no tenderness.  Mild old ecchymosis to left anterior knee.  Neurological: She is alert and oriented to person, place, and time.  alert, oriented x3 speech: normal in context and clarity memory: intact grossly cranial nerves  II-XII: intact motor strength: full proximally and distally no involuntary movements or tremors sensation: intact to light touch diffusely  cerebellar: finger-to-nose and heel-to-shin intact gait: normal forwards and backwards   Skin: Skin is warm and dry.  Psychiatric: She has a normal mood and affect. Her behavior is normal.    ED Course  Procedures (including critical care time) Labs Review Labs Reviewed  CBC WITH DIFFERENTIAL - Abnormal; Notable for the following:    WBC 15.1 (*)    Neutrophils Relative % 85 (*)    Neutro Abs 13.1 (*)    Lymphocytes Relative 9 (*)    All other components within normal limits  COMPREHENSIVE METABOLIC PANEL - Abnormal; Notable for the following:    Glucose, Bld  153 (*)    GFR calc non Af Amer 57 (*)    GFR calc Af Amer 66 (*)    All other components within normal limits  URINALYSIS, ROUTINE W REFLEX MICROSCOPIC - Abnormal; Notable for the following:    APPearance CLOUDY (*)    Hgb urine dipstick TRACE (*)    Leukocytes, UA MODERATE (*)    All other components within normal limits  URINE MICROSCOPIC-ADD ON - Abnormal; Notable for the following:    Bacteria, UA FEW (*)    All other components within normal limits  CBG MONITORING, ED - Abnormal; Notable for the following:    Glucose-Capillary 150 (*)    All other components within normal limits  URINE CULTURE  TROPONIN I    Imaging Review Ct Head Wo Contrast  11/21/2013   CLINICAL DATA:  Dizziness status post fall 1 week ago  EXAM: CT HEAD WITHOUT CONTRAST  TECHNIQUE: Contiguous axial images were obtained from the base of the skull through the vertex without intravenous contrast.  COMPARISON:  Noncontrast CT scan of the brain of November 15, 2013.  FINDINGS: There is mild age appropriate diffuse cerebral and cerebellar atrophy with compensatory ventriculomegaly. There is no intracranial hemorrhage nor intracranial mass effect nor acute ischemic change. There is stable dense calcification to the  right of midline within the upper medulla. No surrounding edema is demonstrated.  The observed paranasal sinuses and mastoid air cells are clear. There is no skull fracture.  IMPRESSION: There is no acute intracranial abnormality. There are stable age related atrophic changes.   Electronically Signed   By: David  Martinique   On: 11/21/2013 10:37     EKG Interpretation   Date/Time:  Friday November 21 2013 08:55:53 EDT Ventricular Rate:  67 PR Interval:  152 QRS Duration: 96 QT Interval:  440 QTC Calculation: 464 R Axis:   -45 Text Interpretation:  Sinus rhythm Left anterior fascicular block Anterior  infarct, old No significant change since last tracing Confirmed by  Chasitee Zenker  MD, Tyrian Peart ([id number]) on 11/21/2013 10:03:58 AM      MDM   Final diagnoses:  Dizziness  UTI (lower urinary tract infection)    9:43 AM 80 y.o. female w hx of DM, HTN, HLP, recent fall on 7/25 w/ neg workup who presents with dizziness which she first noticed upon awakening this morning around 6:30 AM. She states that she felt like the room was spinning and it seemed to be worse with standing. She notes her symptoms have slowly resolved since that time and she now denies any dizziness on exam. She has a normal neurologic exam. Will get screening labs and imaging given recent fall. She notes that she has not had any dizziness or headache in the interim and has otherwise been doing well.  11:52 AM: UA suspicious for UTI. Given elev wbc will tx w/ rocephin here and keflex for home. Labs/imaging otherwise unremarkable. Pt remains asx. Doubt CVA as sx have resolved, pt ambulatory, and otherwise well appearing. Sx possibly related to UTI vs recent head injury.  I have discussed the diagnosis/risks/treatment options with the patient and family and believe the pt to be eligible for discharge home to follow-up with pcp as needed. We also discussed returning to the ED immediately if new or worsening sx occur. We discussed the sx which are  most concerning (e.g., worsening dizziness, weakness, numbness) that necessitate immediate return. Medications administered to the patient during their visit and any new prescriptions provided to the  patient are listed below.  Medications given during this visit Medications  sodium chloride 0.9 % bolus 500 mL (0 mLs Intravenous Stopped 11/21/13 1041)  cefTRIAXone (ROCEPHIN) 1 g in dextrose 5 % 50 mL IVPB (0 g Intravenous Stopped 11/21/13 1129)    New Prescriptions   CEPHALEXIN (KEFLEX) 500 MG CAPSULE    Take 1 capsule (500 mg total) by mouth 4 (four) times daily.     Blanchard Kelch, MD 11/21/13 1620

## 2013-11-21 NOTE — ED Notes (Signed)
EMS - Pt coming from home with c/o of dizziness that started this morning upon awakening around 0635.  Pt was seen in the ED on Saturday after sustaining a fall and hitting her head against the bricks of her home.  Pt has had no issues until today from this incident.

## 2013-11-22 LAB — URINE CULTURE: Colony Count: 45000

## 2013-11-22 NOTE — ED Provider Notes (Signed)
I saw and evaluated the patient, reviewed the resident's note and I agree with the findings and plan.   EKG Interpretation None        EKG Interpretation None      Tanna Furry, MD 11/22/13 2000

## 2013-11-24 ENCOUNTER — Encounter: Payer: Self-pay | Admitting: Family Medicine

## 2013-11-24 ENCOUNTER — Ambulatory Visit (INDEPENDENT_AMBULATORY_CARE_PROVIDER_SITE_OTHER): Payer: Medicare Other | Admitting: Family Medicine

## 2013-11-24 VITALS — BP 110/60 | HR 79 | Temp 98.4°F | Ht 63.0 in | Wt 139.8 lb

## 2013-11-24 DIAGNOSIS — R7309 Other abnormal glucose: Secondary | ICD-10-CM

## 2013-11-24 DIAGNOSIS — E119 Type 2 diabetes mellitus without complications: Secondary | ICD-10-CM

## 2013-11-24 DIAGNOSIS — R42 Dizziness and giddiness: Secondary | ICD-10-CM

## 2013-11-24 DIAGNOSIS — R739 Hyperglycemia, unspecified: Secondary | ICD-10-CM

## 2013-11-24 DIAGNOSIS — W19XXXA Unspecified fall, initial encounter: Secondary | ICD-10-CM | POA: Diagnosis not present

## 2013-11-24 MED ORDER — METFORMIN HCL 500 MG PO TABS: 500.0000 mg | ORAL_TABLET | Freq: Every day | ORAL | Status: DC

## 2013-11-24 NOTE — Patient Instructions (Addendum)
Check blood sugar either 1st thing in the morning or 2 hours after a meal. Vary up the times that you check it.    [facility] Low Glycemic Diet (Source: [facility], 2006)  Low Glycemic Foods (20-49) (Decrease risk of developing heart disease)  Best for Diabetes: Eat Mostly these  Breakfast Cereals: All-Bran All-Bran Fruit 'n [name] Fiber One Oatmeal (not instant) Oat bran  Fruits and fruit juices: (Limit to 1-2 servings per day) Apples Apricots (fresh & dried) Blackberries Blueberries Cherries Cranberries Peaches Pears Plums Prunes Grapefruit Raspberries Strawberries Tangerine  Juices: Apple juice Grapefruit juice Tomato juice  Beans and legumes (fresh-cooked): Black-eyed peas Butter beans Chick peas Lentils  Green beans Lima beans Kidney beans Navy beans Pinto beans Snow peas  Non-starchy vegetables: Asparagus, avocado, broccoli, cabbage, cauliflower, celery, cucumber, greens, lettuce, mushrooms, peppers, tomatoes, okra, onions, spinach, summer squash  Grains: Barley Bulgur Rye Wild rice  Nuts and oils : Almonds Peanuts Sunflower seeds Hazelnuts Pecans Walnuts Oils that are liquid at room temperature  Dairy, fish, meat, soy, and eggs: Milk, skim Lowfat cheese Yogurt, lowfat, fruit sugar sweetened Lean red meat Fish  Skinless chicken & [place] Shellfish Egg whites (up to 3 daily) Soy products  Egg yolks (up to 7 or _____ per week) Moderate Glycemic Foods (50-69)  OK sometimes with diabetes  Breakfast Cereals: Bran Buds Bran Chex Just Right Mini-Wheats  Special K Swiss muesli  Fruits: Banana (under-ripe) Dates Figs Grapes Kiwi Mango Oranges Raisins  Fruit Juices: Cranberry juice Orange juice  Beans and legumes: [place]-type baked beans Canned pinto, kidney, or navy beans Green peas  Vegetables: Beets Carrots  Sweet potato Yam Corn on the cob  Breads: Pita (pocket) bread Oat bran bread Pumpernickel bread Rye  bread Wheat bread, high fiber   Grains: Cornmeal Rice, brown Rice, white Couscous  Pasta: Macaroni Pizza, cheese Ravioli, meat filled Spaghetti, white   Nuts: Cashews Macadamia  Snacks: Chocolate Ice cream, lowfat Muffin Popcorn High Glycemic Foods (70-100)  Rare: Eat occaisionally with diabetes  THESE ARE THE WORST KIND OF FOODS FOR YOUR DIABETES  Breakfast Cereals: Cheerios Corn Chex Corn Flakes Cream of Wheat Grape Nuts Grape Nut Flakes Grits Nutri-Grain Puffed Rice Puffed Wheat Rice Chex Rice Krispies Shredded Wheat Team Total  Fruits: Pineapple Watermelon Banana (over-ripe) Beverages: Sodas, sweet tea, pineapple juice  Vegetables: Potato, baked, boiled, fried, mashed [place] fries Canned or frozen corn Parsnips Winter squash  Breads: Most breads (white and whole grain) Bagels Bread sticks Bread stuffing Kaiser roll Dinner rolls  Grains: Rice, instant Tapioca, with milk Candy and most cookies  Snacks: Donuts Corn chips Jelly beans Pretzels Pastries

## 2013-11-24 NOTE — Progress Notes (Signed)
Pre visit review using our clinic review tool, if applicable. No additional management support is needed unless otherwise documented below in the visit note. 

## 2013-11-24 NOTE — Progress Notes (Signed)
[place] [address] Phone: [phone] Fax: [phone]  Patient ID: [name] MRN: [id number], DOB: 05/20/1922, 80 y.o. Date of Encounter: 11/24/2013  Primary Physician:  Eliezer Lofts, MD   Chief Complaint: Hyperglycemia   Subjective:   History of Present Illness:  [name] is a 80 y.o. very pleasant female patient who presents with the following:  Elderly patient with multiple medical problems for ER follow-up. (Patient of Dr. Jacinto Reap)  Globally, the patient has had 2 Emergency Room visits and less than 2 weeks, and she has had 3 Emergency Room visits in approximately 6 weeks. She is 80 years old, and she is here accompanied today by her daughter.  I was asked to see the patient on a relatively urgent basis for hyperglycemia, but has not cured the story, and reviewed the patient's chart, this is not a new phenomenon, and she has had some borderline elevated blood sugars in the past.  Within the last 2 weeks she has gotten dizzy, and she has had 2 episodes of falling where she had struck her head. She has had 2 different CTs of the head without contrast, both of which were grossly unremarkable. She also had a CT of her neck, and there was no occult fracture.  Felt really dizzy and bad and had a UTI. On her urine like her, the patient had 11-20 white blood cells with some few bacteria. Presumptively, she was felt to have a urinary tract infection. She also had a culture which grew out multiple morphologies.  Her dizziness is improved now, she is quite pleasant here in the office with her daughter.  While giving some IV fluids, given some IV ABX. She started to feel poorly, but after she got some IV fluids and was in the hospital, she felt better.  190+ yesterday.   DM: last a1c is 7.1 At bedtime, given the patient was 80 years old, we did not initiate any kind of medication. She is very worried about her blood sugars, and she seemed to be quite anxious  about this and asked a lot of questions about them today. Looking at her transdermal time, she has been stable with minimally active diabetes and controlled without medication.  Past Medical History, Surgical History, Social History, Family History, Problem List, Medications, and Allergies have been reviewed and updated if relevant.  Review of Systems:  GEN:as above GI: as above Pulm: No SOB Interactive and getting along well at home.  Otherwise, ROS is as per the HPI.  Objective:   Physical Examination: BP 110/60  Pulse 79  Temp(Src) 98.4 F (36.9 C) (Oral)  Ht 5\' 3"  (1.6 m)  Wt 139 lb 12 oz (63.39 kg)  BMI 24.76 kg/m2   GEN: WDWN, NAD, Non-toxic, A & O x 3 HEENT: Atraumatic, Normocephalic. Neck supple. No masses, No LAD. Ears and Nose: No external deformity. CV: RRR, No M/G/R. No JVD. No thrill. No extra heart sounds. PULM: CTA B, no wheezes, crackles, rhonchi. No retractions. No resp. distress. No accessory muscle use. EXTR: No c/c/e NEURO Normal gait.  PSYCH: Normally interactive. Conversant. Not depressed or anxious appearing.  Calm demeanor.   Laboratory and Imaging Data: Results for orders placed during the hospital encounter of 11/21/13  URINE CULTURE      Result Value Ref Range   Specimen Description URINE, CLEAN CATCH     Special Requests NONE     Culture  Setup Time  Value: 11/21/2013 13:31     Performed at [facility] Count       Value: 45,000 COLONIES/ML     Performed at [facility]   Culture       Value: Multiple bacterial morphotypes present, none predominant. Suggest appropriate recollection if clinically indicated.     Performed at [facility]   Report Status 11/22/2013 FINAL    CBC WITH DIFFERENTIAL      Result Value Ref Range   WBC 15.1 (*) 4.0 - 10.5 K/uL   RBC 4.48  3.87 - 5.11 MIL/uL   Hemoglobin 14.1  12.0 - 15.0 g/dL   HCT 42.5  36.0 - 46.0 %   MCV 94.9  78.0 - 100.0 fL   MCH 31.5  26.0 - 34.0 pg    MCHC 33.2  30.0 - 36.0 g/dL   RDW 14.1  11.5 - 15.5 %   Platelets 252  150 - 400 K/uL   Neutrophils Relative % 85 (*) 43 - 77 %   Neutro Abs 13.1 (*) 1.7 - 7.7 K/uL   Lymphocytes Relative 9 (*) 12 - 46 %   Lymphs Abs 1.3  0.7 - 4.0 K/uL   Monocytes Relative 5  3 - 12 %   Monocytes Absolute 0.7  0.1 - 1.0 K/uL   Eosinophils Relative 1  0 - 5 %   Eosinophils Absolute 0.1  0.0 - 0.7 K/uL   Basophils Relative 0  0 - 1 %   Basophils Absolute 0.0  0.0 - 0.1 K/uL  COMPREHENSIVE METABOLIC PANEL      Result Value Ref Range   Sodium 139  137 - 147 mEq/L   Potassium 4.4  3.7 - 5.3 mEq/L   Chloride 104  96 - 112 mEq/L   CO2 22  19 - 32 mEq/L   Glucose, Bld 153 (*) 70 - 99 mg/dL   BUN 18  6 - 23 mg/dL   Creatinine, Ser 0.88  0.50 - 1.10 mg/dL   Calcium 9.2  8.4 - 10.5 mg/dL   Total Protein 7.3  6.0 - 8.3 g/dL   Albumin 3.6  3.5 - 5.2 g/dL   AST 22  0 - 37 U/L   ALT 17  0 - 35 U/L   Alkaline Phosphatase 49  39 - 117 U/L   Total Bilirubin 0.5  0.3 - 1.2 mg/dL   GFR calc non Af Amer 57 (*) >90 mL/min   GFR calc Af Amer 66 (*) >90 mL/min   Anion gap 13  5 - 15  TROPONIN I      Result Value Ref Range   Troponin I <0.30  <0.30 ng/mL  URINALYSIS, ROUTINE W REFLEX MICROSCOPIC      Result Value Ref Range   Color, Urine YELLOW  YELLOW   APPearance CLOUDY (*) CLEAR   Specific Gravity, Urine 1.019  1.005 - 1.030   pH 6.0  5.0 - 8.0   Glucose, UA NEGATIVE  NEGATIVE mg/dL   Hgb urine dipstick TRACE (*) NEGATIVE   Bilirubin Urine NEGATIVE  NEGATIVE   Ketones, ur NEGATIVE  NEGATIVE mg/dL   Protein, ur NEGATIVE  NEGATIVE mg/dL   Urobilinogen, UA 0.2  0.0 - 1.0 mg/dL   Nitrite NEGATIVE  NEGATIVE   Leukocytes, UA MODERATE (*) NEGATIVE  URINE MICROSCOPIC-ADD ON      Result Value Ref Range   Squamous Epithelial / LPF RARE  RARE   WBC, UA 11-20  <3  WBC/hpf   RBC / HPF 0-2  <3 RBC/hpf   Bacteria, UA FEW (*) RARE   Urine-Other MUCOUS PRESENT    CBG MONITORING, ED      Result Value Ref Range    Glucose-Capillary 150 (*) 70 - 99 mg/dL   Comment 1 Documented in Chart     Comment 2 Notify RN      Ct Head Wo Contrast  11/21/2013   CLINICAL DATA:  Dizziness status post fall 1 week ago  EXAM: CT HEAD WITHOUT CONTRAST  TECHNIQUE: Contiguous axial images were obtained from the base of the skull through the vertex without intravenous contrast.  COMPARISON:  Noncontrast CT scan of the brain of November 15, 2013.  FINDINGS: There is mild age appropriate diffuse cerebral and cerebellar atrophy with compensatory ventriculomegaly. There is no intracranial hemorrhage nor intracranial mass effect nor acute ischemic change. There is stable dense calcification to the right of midline within the upper medulla. No surrounding edema is demonstrated.  The observed paranasal sinuses and mastoid air cells are clear. There is no skull fracture.  IMPRESSION: There is no acute intracranial abnormality. There are stable age related atrophic changes.   Electronically Signed   By: David  Martinique   On: 11/21/2013 10:37   Ct Head Wo Contrast  11/15/2013   CLINICAL DATA:  HEAD INJURY Post fall  EXAM: CT HEAD WITHOUT CONTRAST  CT CERVICAL SPINE WITHOUT CONTRAST  TECHNIQUE: Multidetector CT imaging of the head and cervical spine was performed following the standard protocol without intravenous contrast. Multiplanar CT image reconstructions of the cervical spine were also generated.  COMPARISON:  02/13/2009  FINDINGS: CT HEAD FINDINGS  Atherosclerotic and physiologic intracranial calcifications. Progressive coarse calcification in the medulla to the right of midline. Mild atrophy. There is no evidence of acute intracranial hemorrhage, brain edema, mass lesion, acute infarction, mass effect, or midline shift. Acute infarct may be inapparent on noncontrast CT. No other intra-axial abnormalities are seen, and the ventricles and sulci are within normal limits in size and symmetry. No abnormal extra-axial fluid collections or masses are  identified. No significant calvarial abnormality.  CT CERVICAL SPINE FINDINGS  Normal alignment. No prevertebral soft tissue swelling. Mild narrowing of interspaces C2-C6 with small endplate spurs and disc bulges. Facets are seated with asymmetric DJD right C3-4, left C4-7 with foraminal encroachment. Negative for fracture. Visualized lung apices clear. Bilateral calcified carotid bifurcation plaque.  IMPRESSION: 1. Negative for bleed or other acute intracranial process. 2. Negative for cervical fracture or other acute abnormality. 3. Multilevel spondylitic changes as above.   Electronically Signed   By: Arne Cleveland M.D.   On: 11/15/2013 14:07   Ct Cervical Spine Wo Contrast  11/15/2013   CLINICAL DATA:  HEAD INJURY Post fall  EXAM: CT HEAD WITHOUT CONTRAST  CT CERVICAL SPINE WITHOUT CONTRAST  TECHNIQUE: Multidetector CT imaging of the head and cervical spine was performed following the standard protocol without intravenous contrast. Multiplanar CT image reconstructions of the cervical spine were also generated.  COMPARISON:  02/13/2009  FINDINGS: CT HEAD FINDINGS  Atherosclerotic and physiologic intracranial calcifications. Progressive coarse calcification in the medulla to the right of midline. Mild atrophy. There is no evidence of acute intracranial hemorrhage, brain edema, mass lesion, acute infarction, mass effect, or midline shift. Acute infarct may be inapparent on noncontrast CT. No other intra-axial abnormalities are seen, and the ventricles and sulci are within normal limits in size and symmetry. No abnormal extra-axial fluid collections or masses are identified.  No significant calvarial abnormality.  CT CERVICAL SPINE FINDINGS  Normal alignment. No prevertebral soft tissue swelling. Mild narrowing of interspaces C2-C6 with small endplate spurs and disc bulges. Facets are seated with asymmetric DJD right C3-4, left C4-7 with foraminal encroachment. Negative for fracture. Visualized lung apices  clear. Bilateral calcified carotid bifurcation plaque.  IMPRESSION: 1. Negative for bleed or other acute intracranial process. 2. Negative for cervical fracture or other acute abnormality. 3. Multilevel spondylitic changes as above.   Electronically Signed   By: Arne Cleveland M.D.   On: 11/15/2013 14:07    Assessment & Plan:   DIABETES MELLITUS, TYPE II  Hyperglycemia  Fall, initial encounter  Dizziness and giddiness  The patient actually looks pretty good right now. Urine culture is inconclusive. I am not sure if the patient had a urinary tract infection, she did have some white blood cells. It is certainly appropriate to treat that.  I doubtful that her intermittently high blood sugars in the 100s range her causing her to be variably lightheaded and dizzy. Nevertheless, we had a long discussion with the patient and her daughter, and I think it is not unreasonable to put her on a very low dose of some oral diabetic medication.  Long discussion with the patient and her daughter, and they don't think that she is at any time mental status changes at all, significant headaches, nausea Ms., alteration in mood, rather changes after she hit her head.  New Prescriptions   METFORMIN (GLUCOPHAGE) 500 MG TABLET    Take 1 tablet (500 mg total) by mouth daily with breakfast.    Follow-up: Return in about 2 months (around 01/24/2014) for Dr. Diona Browner. Unless noted above, the patient is to follow-up if symptoms worsen. Red flags were reviewed with the patient.  Signed,  Maud Deed. Abygale Karpf, MD, [place]  Patient Instructions   Check blood sugar either 1st thing in the morning or 2 hours after a meal. Vary up the times that you check it.    [facility] Low Glycemic Diet (Source: [facility], 2006)  Low Glycemic Foods (20-49) (Decrease risk of developing heart disease)  Best for Diabetes: Eat Mostly these  Breakfast Cereals: All-Bran All-Bran Fruit 'n  [name] Fiber One Oatmeal (not instant) Oat bran  Fruits and fruit juices: (Limit to 1-2 servings per day) Apples Apricots (fresh & dried) Blackberries Blueberries Cherries Cranberries Peaches Pears Plums Prunes Grapefruit Raspberries Strawberries Tangerine  Juices: Apple juice Grapefruit juice Tomato juice  Beans and legumes (fresh-cooked): Black-eyed peas Butter beans Chick peas Lentils  Green beans Lima beans Kidney beans Navy beans Pinto beans Snow peas  Non-starchy vegetables: Asparagus, avocado, broccoli, cabbage, cauliflower, celery, cucumber, greens, lettuce, mushrooms, peppers, tomatoes, okra, onions, spinach, summer squash  Grains: Barley Bulgur Rye Wild rice  Nuts and oils : Almonds Peanuts Sunflower seeds Hazelnuts Pecans Walnuts Oils that are liquid at room temperature  Dairy, fish, meat, soy, and eggs: Milk, skim Lowfat cheese Yogurt, lowfat, fruit sugar sweetened Lean red meat Fish  Skinless chicken & [place] Shellfish Egg whites (up to 3 daily) Soy products  Egg yolks (up to 7 or _____ per week) Moderate Glycemic Foods (50-69)  OK sometimes with diabetes  Breakfast Cereals: Bran Buds Bran Chex Just Right Mini-Wheats  Special K Swiss muesli  Fruits: Banana (under-ripe) Dates Figs Grapes Kiwi Mango Oranges Raisins  Fruit Juices: Cranberry juice Orange juice  Beans and legumes: [place]-type baked beans Canned pinto, kidney, or navy beans Green  peas  Vegetables: Beets Carrots  Sweet potato Yam Corn on the cob  Breads: Pita (pocket) bread Oat bran bread Pumpernickel bread Rye bread Wheat bread, high fiber   Grains: Cornmeal Rice, brown Rice, white Couscous  Pasta: Macaroni Pizza, cheese Ravioli, meat filled Spaghetti, white   Nuts: Cashews Macadamia  Snacks: Chocolate Ice cream, lowfat Muffin Popcorn High Glycemic Foods (70-100)  Rare: Eat occaisionally with diabetes  THESE ARE THE WORST KIND OF FOODS FOR YOUR  DIABETES  Breakfast Cereals: Cheerios Corn Chex Corn Flakes Cream of Wheat Grape Nuts Grape Nut Flakes Grits Nutri-Grain Puffed Rice Puffed Wheat Rice Chex Rice Krispies Shredded Wheat Team Total  Fruits: Pineapple Watermelon Banana (over-ripe) Beverages: Sodas, sweet tea, pineapple juice  Vegetables: Potato, baked, boiled, fried, mashed [place] fries Canned or frozen corn Parsnips Winter squash  Breads: Most breads (white and whole grain) Bagels Bread sticks Bread stuffing Kaiser roll Dinner rolls  Grains: Rice, instant Tapioca, with milk Candy and most cookies  Snacks: Donuts Corn chips Jelly beans Pretzels Pastries        Current Medications at Discharge:   Medication List       This list is accurate as of: 11/24/13 11:59 PM.  Always use your most recent med list.               acetaminophen 325 MG tablet  Commonly known as:  TYLENOL  Take 650 mg by mouth every 6 (six) hours as needed for mild pain, moderate pain or fever.     aspirin EC 81 MG tablet  Take 81 mg by mouth daily.     cephALEXin 500 MG capsule  Commonly known as:  KEFLEX  Take 1 capsule (500 mg total) by mouth 4 (four) times daily.     CITRACAL PO  Take 1 capsule by mouth daily.     diclofenac sodium 1 % Gel  Commonly known as:  VOLTAREN  Apply 2 g topically 2 (two) times daily as needed (for pain).     fish oil-omega-3 fatty acids 1000 MG capsule  Take 1 g by mouth daily.     fluticasone 50 MCG/ACT nasal spray  Commonly known as:  FLONASE  Place 2 sprays into both nostrils daily.     glucose blood test strip  Commonly known as:  FREESTYLE TEST STRIPS  Use to check blood sugar 1-2 times per day. Dx code 250.00 non insulin dependent.     ketoconazole 2 % cream  Commonly known as:  NIZORAL  Apply topically daily.     magnesium hydroxide 311 MG Chew chewable tablet  Commonly known as:  PHILLIPS CHEWS  Chew 311 mg by mouth daily as needed (for constipation).      metFORMIN 500 MG tablet  Commonly known as:  GLUCOPHAGE  Take 1 tablet (500 mg total) by mouth daily with breakfast.     multivitamin tablet  Take 1 tablet by mouth daily.     ramipril 5 MG capsule  Commonly known as:  ALTACE  Take 5 mg by mouth daily.     simvastatin 40 MG tablet  Commonly known as:  ZOCOR  Take 40 mg by mouth daily.     SYSTANE OP  Apply 1 drop to eye daily as needed (for dry eyes).     traMADol 50 MG tablet  Commonly known as:  ULTRAM  Take 1 tablet (50 mg total) by mouth every 6 (six) hours as needed.

## 2013-12-01 ENCOUNTER — Other Ambulatory Visit: Payer: Self-pay

## 2013-12-01 MED ORDER — FREESTYLE UNISTICK II LANCETS MISC: Status: DC

## 2013-12-01 NOTE — Telephone Encounter (Signed)
Pt request freestyle lancets [address] advised pt done.

## 2013-12-02 MED ORDER — FREESTYLE UNISTICK II LANCETS MISC: Status: DC

## 2013-12-02 NOTE — Addendum Note (Signed)
Addended by: Helene Shoe on: 12/02/2013 02:29 PM   Modules accepted: Orders

## 2013-12-02 NOTE — Telephone Encounter (Signed)
Pt called back and [facility] [address] did not receive fax; spoke with Barnabas Lister at [facility] and he advised to send electronically. Advise pt done.

## 2013-12-11 ENCOUNTER — Other Ambulatory Visit: Payer: Self-pay | Admitting: Family Medicine

## 2013-12-16 ENCOUNTER — Ambulatory Visit (INDEPENDENT_AMBULATORY_CARE_PROVIDER_SITE_OTHER): Payer: Medicare Other | Admitting: Family Medicine

## 2013-12-16 ENCOUNTER — Encounter: Payer: Self-pay | Admitting: Family Medicine

## 2013-12-16 VITALS — BP 112/60 | HR 88 | Temp 98.0°F | Ht 63.0 in | Wt 139.0 lb

## 2013-12-16 DIAGNOSIS — E785 Hyperlipidemia, unspecified: Secondary | ICD-10-CM | POA: Diagnosis not present

## 2013-12-16 DIAGNOSIS — R5381 Other malaise: Secondary | ICD-10-CM | POA: Diagnosis not present

## 2013-12-16 DIAGNOSIS — E119 Type 2 diabetes mellitus without complications: Secondary | ICD-10-CM

## 2013-12-16 DIAGNOSIS — R259 Unspecified abnormal involuntary movements: Secondary | ICD-10-CM

## 2013-12-16 DIAGNOSIS — R5383 Other fatigue: Secondary | ICD-10-CM

## 2013-12-16 DIAGNOSIS — R531 Weakness: Secondary | ICD-10-CM

## 2013-12-16 DIAGNOSIS — R251 Tremor, unspecified: Secondary | ICD-10-CM

## 2013-12-16 LAB — POCT URINALYSIS DIPSTICK
BILIRUBIN UA: NEGATIVE
Glucose, UA: NEGATIVE
Ketones, UA: NEGATIVE
NITRITE UA: NEGATIVE
PH UA: 6
PROTEIN UA: NEGATIVE
RBC UA: NEGATIVE
Spec Grav, UA: >=1.03
Urobilinogen, UA: 0.2

## 2013-12-16 NOTE — Assessment & Plan Note (Signed)
Episode today not clearly due to blood sugar as blood sugar was fairly well controlled. ? Recurrence of UTI. ? Send urine for culture. Will also eval with anemia, thyroid , electrolyte issues.

## 2013-12-16 NOTE — Assessment & Plan Note (Signed)
Due for re-eval A1C. Blood sugars today postprandial were for the most part good. Continue metformin anf follow CBGs. Bring to next appt. Counseled on DM diet, exercise and blood sugar control.

## 2013-12-16 NOTE — Patient Instructions (Addendum)
Stop at lab on way out. Use cane when out of house for stability. We call with culture results. Follow up 79-month 30 min OV.

## 2013-12-16 NOTE — Progress Notes (Signed)
   Subjective:    Patient ID: [name], female    DOB: [birth year], 80 y.o.   MRN: [id number]  HPI  80 year old female with DM presents for follow up.  She had been seen in past few month several times in ER for dizziness and falls. Saw Dr. Lorelei Pont on 8/3. At that time she was started on metformin  500 mg daily for DM.  Lab Results  Component Value Date   HGBA1C 7.1* 06/11/2013   Today she reports CBG 2 hours after meals 53-242 She had been feeling better since UTI treated. She felt shaky,weak this AM it happened but her CBG was 159. Episode lasted few hours. Feels better after she eats. No chest pain, no SOB.  No dysuria, no frequency.  She does feel unsteady on her feet at times. Has cane but does not use.  BP Readings from Last 3 Encounters:  12/16/13 112/60  11/24/13 110/60  11/21/13 145/81       Review of Systems  Constitutional: Negative for fever and fatigue.  HENT: Negative for ear pain.   Eyes: Negative for pain.  Respiratory: Negative for chest tightness and shortness of breath.   Cardiovascular: Negative for chest pain, palpitations and leg swelling.  Gastrointestinal: Negative for abdominal pain.  Genitourinary: Negative for dysuria.       Objective:   Physical Exam  Constitutional: She is oriented to person, place, and time. Vital signs are normal. She appears well-developed and well-nourished. She is cooperative.  Non-toxic appearance. She does not appear ill. No distress.  Elderly female in NAD  HENT:  Head: Normocephalic.  Right Ear: Hearing, tympanic membrane, external ear and ear canal normal. Tympanic membrane is not erythematous, not retracted and not bulging.  Left Ear: Hearing, tympanic membrane, external ear and ear canal normal. Tympanic membrane is not erythematous, not retracted and not bulging.  Nose: No mucosal edema or rhinorrhea. Right sinus exhibits no maxillary sinus tenderness and no frontal sinus tenderness. Left sinus  exhibits no maxillary sinus tenderness and no frontal sinus tenderness.  Mouth/Throat: Uvula is midline, oropharynx is clear and moist and mucous membranes are normal.  Eyes: Conjunctivae, EOM and lids are normal. Pupils are equal, round, and reactive to light. Lids are everted and swept, no foreign bodies found.  Neck: Trachea normal and normal range of motion. Neck supple. Carotid bruit is not present. No mass and no thyromegaly present.  Cardiovascular: Normal rate, regular rhythm, S1 normal, S2 normal, normal heart sounds, intact distal pulses and normal pulses.  Exam reveals no gallop and no friction rub.   No murmur heard. Pulmonary/Chest: Effort normal and breath sounds normal. Not tachypneic. No respiratory distress. She has no decreased breath sounds. She has no wheezes. She has no rhonchi. She has no rales.  Abdominal: Soft. Normal appearance and bowel sounds are normal. There is no tenderness.  Neurological: She is alert and oriented to person, place, and time.  Skin: Skin is warm, dry and intact. No rash noted.  Psychiatric: Her speech is normal and behavior is normal. Judgment and thought content normal. Her mood appears not anxious. Cognition and memory are normal. She does not exhibit a depressed mood.          Assessment & Plan:

## 2013-12-16 NOTE — Progress Notes (Signed)
Pre visit review using our clinic review tool, if applicable. No additional management support is needed unless otherwise documented below in the visit note. 

## 2013-12-17 LAB — COMPREHENSIVE METABOLIC PANEL
ALT: 17 U/L (ref 0–35)
AST: 24 U/L (ref 0–37)
Albumin: 3.8 g/dL (ref 3.5–5.2)
Alkaline Phosphatase: 46 U/L (ref 39–117)
BILIRUBIN TOTAL: 0.6 mg/dL (ref 0.2–1.2)
BUN: 23 mg/dL (ref 6–23)
CO2: 25 mEq/L (ref 19–32)
Calcium: 9.8 mg/dL (ref 8.4–10.5)
Chloride: 103 mEq/L (ref 96–112)
Creatinine, Ser: 1.1 mg/dL (ref 0.4–1.2)
GFR: 49.71 mL/min — ABNORMAL LOW (ref >60.00)
GLUCOSE: 84 mg/dL (ref 70–99)
Potassium: 4.3 mEq/L (ref 3.5–5.1)
Sodium: 139 mEq/L (ref 135–145)
Total Protein: 7.4 g/dL (ref 6.0–8.3)

## 2013-12-17 LAB — CBC WITH DIFFERENTIAL/PLATELET
Basophils Absolute: 0.1 10*3/uL (ref 0.0–0.1)
Basophils Relative: 0.8 % (ref 0.0–3.0)
EOS ABS: 0.1 10*3/uL (ref 0.0–0.7)
Eosinophils Relative: 1.1 % (ref 0.0–5.0)
HCT: 40.4 % (ref 36.0–46.0)
HEMOGLOBIN: 13.6 g/dL (ref 12.0–15.0)
Lymphocytes Relative: 17.8 % (ref 12.0–46.0)
Lymphs Abs: 1.9 10*3/uL (ref 0.7–4.0)
MCHC: 33.6 g/dL (ref 30.0–36.0)
MCV: 93.6 fl (ref 78.0–100.0)
MONO ABS: 0.6 10*3/uL (ref 0.1–1.0)
Monocytes Relative: 5.6 % (ref 3.0–12.0)
NEUTROS ABS: 7.9 10*3/uL — AB (ref 1.4–7.7)
NEUTROS PCT: 74.7 % (ref 43.0–77.0)
Platelets: 282 10*3/uL (ref 150.0–400.0)
RBC: 4.31 Mil/uL (ref 3.87–5.11)
RDW: 14.4 % (ref 11.5–15.5)
WBC: 10.6 10*3/uL — ABNORMAL HIGH (ref 4.0–10.5)

## 2013-12-17 LAB — TSH: TSH: 1.3 u[IU]/mL (ref 0.35–4.50)

## 2013-12-17 LAB — URINE CULTURE

## 2013-12-17 LAB — HEMOGLOBIN A1C: Hgb A1c MFr Bld: 6.6 % — ABNORMAL HIGH (ref 4.6–6.5)

## 2014-01-14 DIAGNOSIS — Z23 Encounter for immunization: Secondary | ICD-10-CM | POA: Diagnosis not present

## 2014-01-20 DIAGNOSIS — M25569 Pain in unspecified knee: Secondary | ICD-10-CM | POA: Diagnosis not present

## 2014-01-20 DIAGNOSIS — M25519 Pain in unspecified shoulder: Secondary | ICD-10-CM | POA: Diagnosis not present

## 2014-02-18 ENCOUNTER — Encounter: Payer: Self-pay | Admitting: Family Medicine

## 2014-02-18 ENCOUNTER — Ambulatory Visit (INDEPENDENT_AMBULATORY_CARE_PROVIDER_SITE_OTHER): Payer: Medicare Other | Admitting: Family Medicine

## 2014-02-18 VITALS — BP 100/60 | HR 91 | Temp 97.9°F | Ht 63.0 in | Wt 136.8 lb

## 2014-02-18 DIAGNOSIS — M1712 Unilateral primary osteoarthritis, left knee: Secondary | ICD-10-CM | POA: Diagnosis not present

## 2014-02-18 DIAGNOSIS — E119 Type 2 diabetes mellitus without complications: Secondary | ICD-10-CM | POA: Diagnosis not present

## 2014-02-18 NOTE — Progress Notes (Signed)
Pre visit review using our clinic review tool, if applicable. No additional management support is needed unless otherwise documented below in the visit note. 

## 2014-02-18 NOTE — Progress Notes (Signed)
Dr. Frederico Hamman T. Avabella Wailes, MD, [facility] [place], [address] Phone: [phone] Fax: [phone]  02/18/2014  Patient: [name], MRN: [id number], DOB: March 28, 1923, 80 y.o.  Primary Physician:  Eliezer Lofts, MD  Chief Complaint: Knee Pain and Problem with Blood Sugar  Subjective:   HAIZLEE HENTON is a 80 y.o. very pleasant female patient who presents with the following:  L knee pain. This is a long-standing problem.  She actually just saw Augustin Coupe very recently.  I reviewed her old films from June, 2015.  She has some significant tricompartmental arthritis, most notably severe in the medial compartment.  Right now, she says that her knee actually  Does not hurt significantly.  She would like some advice about management in the future.  Lab Results  Component Value Date   HGBA1C 6.6* 12/16/2013    She had some questions about her blood sugars, but that actually appear to be under very good control.  Her last hemoglobin A1c was 6.6.  Past Medical History, Surgical History, Social History, Family History, Problem List, Medications, and Allergies have been reviewed and updated if relevant.   GEN: No acute illnesses, no fevers, chills. GI: No n/v/d, eating normally Pulm: No SOB Interactive and getting along well at home.  Otherwise, ROS is as per the HPI.  Objective:   BP 100/60  Pulse 91  Temp(Src) 97.9 F (36.6 C) (Oral)  Ht 5\' 3"  (1.6 m)  Wt 136 lb 12 oz (62.029 kg)  BMI 24.23 kg/m2  GEN: WDWN, NAD, Non-toxic, A & O x 3 HEENT: Atraumatic, Normocephalic. Neck supple. No masses, No LAD. Ears and Nose: No external deformity. EXTR: No c/c/e NEURO Normal gait.  PSYCH: Normally interactive. Conversant. Not depressed or anxious appearing.  Calm demeanor.   Left knee, crepitus is present.  Nontender with palpitation of the patella.  No significant tenderness along medial or lateral joint lines.  McMurray's  test is nontender.  ACL, PCL, MCL, and LCL ARE ALL STABLE  Laboratory and Imaging Data: LEFT KNEE - COMPLETE 4+ VIEW  COMPARISON: None.  FINDINGS:  No acute fracture deformity or dislocation. Tricompartmental  osteoarthrosis, severe within the medial compartment, moderate to  severe within the patellofemoral compartment. Minimal meniscal  calcification. Corticated 10 mm bony fragment projecting along the  medial femoral epicondyle could reflect loose body. Tibial spine  peaking. No destructive bony lesions. Periarticular soft tissue  planes are nonsuspicious.  IMPRESSION:  No acute fracture deformity or dislocation.  Tricompartmental osteoarthrosis, severe within the medial  compartment.  Electronically Signed  By: Elon Alas  On: 10/15/2013 05:36   Assessment and Plan:   Primary osteoarthritis of left knee  Diabetes mellitus type 2, controlled, without complications  Recommended routine use of Tylenol.  If pain is more significant, reassured her that tramadol is safe to use.  I would do nothing further regarding her knee now.  She does have advanced arthritis.  She does not want a knee replacement.  Intermittent injections if needed would also be reasonable.  I have no concerns about the state of her diabetes.  Follow-up: No Follow-up on file.  New Prescriptions   No medications on file   No orders of the defined types were placed in this encounter.    Signed,  Maud Deed. Antionette Luster, MD   Patient's Medications  New Prescriptions   No medications on file  Previous Medications   ACETAMINOPHEN (TYLENOL) 325 MG TABLET  Take 650 mg by mouth every 6 (six) hours as needed for mild pain, moderate pain or fever.   ASPIRIN EC 81 MG TABLET    Take 81 mg by mouth daily.   CALCIUM CITRATE (CITRACAL PO)    Take 1 capsule by mouth daily.    FISH OIL-OMEGA-3 FATTY ACIDS 1000 MG CAPSULE    Take 1 g by mouth daily.     FLUTICASONE (FLONASE) 50 MCG/ACT NASAL SPRAY    Place 2  sprays into both nostrils as needed.   FREESTYLE UNISTICK II LANCETS MISC    Check blood sugar 1 - 2 times a day. Dx 250.00 non insulin dependent.   GLUCOSE BLOOD (FREESTYLE TEST STRIPS) TEST STRIP    Use to check blood sugar 1-2 times per day. Dx code 250.00 non insulin dependent.   KETOCONAZOLE (NIZORAL) 2 % CREAM    APPLY TOPICALLY DAILY.   MAGNESIUM HYDROXIDE (PHILLIPS CHEWS) 311 MG CHEW CHEWABLE TABLET    Chew 311 mg by mouth daily as needed (for constipation).   METFORMIN (GLUCOPHAGE) 500 MG TABLET    Take 1 tablet (500 mg total) by mouth daily with breakfast.   MULTIPLE VITAMIN (MULTIVITAMIN) TABLET    Take 1 tablet by mouth daily.     POLYETHYL GLYCOL-PROPYL GLYCOL (SYSTANE OP)    Apply 1 drop to eye daily as needed (for dry eyes).   RAMIPRIL (ALTACE) 5 MG CAPSULE    Take 5 mg by mouth daily.   SIMVASTATIN (ZOCOR) 40 MG TABLET    Take 40 mg by mouth daily.   TRAMADOL (ULTRAM) 50 MG TABLET    Take 1 tablet (50 mg total) by mouth every 6 (six) hours as needed.  Modified Medications   No medications on file  Discontinued Medications   No medications on file

## 2014-03-05 ENCOUNTER — Other Ambulatory Visit: Payer: Self-pay | Admitting: Cardiovascular Disease

## 2014-03-17 ENCOUNTER — Other Ambulatory Visit: Payer: Self-pay | Admitting: Family Medicine

## 2014-04-01 ENCOUNTER — Telehealth: Payer: Self-pay | Admitting: Family Medicine

## 2014-04-01 NOTE — Telephone Encounter (Signed)
Please have he schedule fasting labs prior to appt.

## 2014-04-01 NOTE — Telephone Encounter (Signed)
Pt has appointment 12/18 and wanted to know if she needs to get any labs prior to this appointment

## 2014-04-02 NOTE — Telephone Encounter (Addendum)
Lab appointment scheduled for 04/03/2014 at 8:30 am.

## 2014-04-02 NOTE — Telephone Encounter (Signed)
Left message for [name] to call the office and schedule a fasting lab appointment prior to her office visit on 04/10/2014.

## 2014-04-03 ENCOUNTER — Telehealth: Payer: Self-pay | Admitting: Family Medicine

## 2014-04-03 ENCOUNTER — Other Ambulatory Visit (INDEPENDENT_AMBULATORY_CARE_PROVIDER_SITE_OTHER): Payer: Medicare Other

## 2014-04-03 DIAGNOSIS — E119 Type 2 diabetes mellitus without complications: Secondary | ICD-10-CM

## 2014-04-03 LAB — COMPREHENSIVE METABOLIC PANEL
ALBUMIN: 3.7 g/dL (ref 3.5–5.2)
ALK PHOS: 49 U/L (ref 39–117)
ALT: 19 U/L (ref 0–35)
AST: 20 U/L (ref 0–37)
BILIRUBIN TOTAL: 0.7 mg/dL (ref 0.2–1.2)
BUN: 20 mg/dL (ref 6–23)
CO2: 27 meq/L (ref 19–32)
Calcium: 9.8 mg/dL (ref 8.4–10.5)
Chloride: 105 mEq/L (ref 96–112)
Creatinine, Ser: 0.9 mg/dL (ref 0.4–1.2)
GFR: 62.63 mL/min (ref >60.00)
Glucose, Bld: 108 mg/dL — ABNORMAL HIGH (ref 70–99)
Potassium: 4.7 mEq/L (ref 3.5–5.1)
Sodium: 138 mEq/L (ref 135–145)
Total Protein: 6.6 g/dL (ref 6.0–8.3)

## 2014-04-03 LAB — LIPID PANEL
CHOLESTEROL: 149 mg/dL (ref 0–200)
HDL: 55.6 mg/dL (ref >39.00)
LDL Cholesterol: 77 mg/dL (ref 0–99)
NonHDL: 93.4
Total CHOL/HDL Ratio: 3
Triglycerides: 82 mg/dL (ref 0.0–149.0)
VLDL: 16.4 mg/dL (ref 0.0–40.0)

## 2014-04-03 LAB — HEMOGLOBIN A1C: HEMOGLOBIN A1C: 6.7 % — AB (ref 4.6–6.5)

## 2014-04-03 NOTE — Telephone Encounter (Signed)
-----   Message from Rich Reining sent at 04/03/2014  8:23 AM EST ----- Regarding: Pt had labs this am, need orders. Pt came in this am for fasting labs. She hasa 6 mo f/u appt on 12/18. Need orders.   Thanks Aniceto Boss

## 2014-04-08 ENCOUNTER — Encounter: Payer: Self-pay | Admitting: Family Medicine

## 2014-04-08 ENCOUNTER — Ambulatory Visit (INDEPENDENT_AMBULATORY_CARE_PROVIDER_SITE_OTHER): Payer: Medicare Other | Admitting: Family Medicine

## 2014-04-08 VITALS — BP 120/72 | HR 74 | Temp 97.9°F | Ht 63.0 in | Wt 137.5 lb

## 2014-04-08 DIAGNOSIS — H8110 Benign paroxysmal vertigo, unspecified ear: Secondary | ICD-10-CM

## 2014-04-08 MED ORDER — MECLIZINE HCL 25 MG PO TABS: 12.5000 mg | ORAL_TABLET | Freq: Three times a day (TID) | ORAL | Status: DC | PRN

## 2014-04-08 NOTE — Progress Notes (Signed)
Dr. Frederico Hamman T. Alexxis Mackert, MD, [facility] [place], [address] Phone: [phone] Fax: [phone]  04/08/2014  Patient: [name], MRN: [id number], DOB: [birth year], 80 y.o.  Primary Physician:  Eliezer Lofts, MD  Chief Complaint: Dizziness  Subjective:   [name] is a 80 y.o. very pleasant female patient who presents with the following:  Woke up about 4 o'clock and everything was spinning around. Then was just laying there. Was supposed to play bridge some. Fixed some cereal with all-bran and had some coffee. Felt like room spinning around her.   167 on BS this morning. Almost fell and got dizzy when leaning over. Took some cepacol and water.   When laid back again, got dizzy again. She has not had any tachycardia. She denies any palpitations. She denies any lightheadedness, presyncope, or syncope.  Vertigo, resolved.  Past Medical History, Surgical History, Social History, Family History, Problem List, Medications, and Allergies have been reviewed and updated if relevant.   GEN: No acute illnesses, no fevers, chills. GI: No n/v/d, eating normally Pulm: No SOB Interactive and getting along well at home.  Otherwise, ROS is as per the HPI.  Objective:   Filed Vitals:   04/08/14 1018  BP: 120/72  Pulse: 74  Temp: 97.9 F (36.6 C)  TempSrc: Oral  Height: 5\' 3"  (1.6 m)  Weight: 137 lb 8 oz (62.37 kg)  SpO2: 99%     GEN: WDWN, NAD, Non-toxic, A & O x 3 HEENT: Atraumatic, Normocephalic. Neck supple. No masses, No LAD. Ears and Nose: No external deformity. CV: RRR, No M/G/R. No JVD. No thrill. No extra heart sounds. PULM: CTA B, no wheezes, crackles, rhonchi. No retractions. No resp. distress. No accessory muscle use. EXTR: No c/c/e NEURO Normal gait.  PSYCH: Normally interactive. Conversant. Not depressed or anxious appearing.  Calm demeanor.   Laboratory and Imaging Data:  Assessment and  Plan:   BPPV (benign paroxysmal positional vertigo), unspecified laterality  At this point, it seems as if the patient had some vertigo due to BPV, and the crystals may have settled. I gave her some meclizine to use when necessary if her symptoms return. I reassured her, and would not do anything at all when she had symptoms lasting more than a month.  Follow-up: No Follow-up on file.  New Prescriptions   MECLIZINE (ANTIVERT) 25 MG TABLET    Take 0.5-1 tablets (12.5-25 mg total) by mouth 3 (three) times daily as needed for dizziness.   No orders of the defined types were placed in this encounter.    Signed,  Maud Deed. Sya Nestler, MD   Patient's Medications  New Prescriptions   MECLIZINE (ANTIVERT) 25 MG TABLET    Take 0.5-1 tablets (12.5-25 mg total) by mouth 3 (three) times daily as needed for dizziness.  Previous Medications   ASPIRIN EC 81 MG TABLET    Take 81 mg by mouth daily.   CALCIUM CITRATE (CITRACAL PO)    Take 1 capsule by mouth daily.    FISH OIL-OMEGA-3 FATTY ACIDS 1000 MG CAPSULE    Take 1 g by mouth daily.     FLUTICASONE (FLONASE) 50 MCG/ACT NASAL SPRAY    Place 2 sprays into both nostrils as needed.   FREESTYLE UNISTICK II LANCETS MISC    Check blood sugar 1 - 2 times a day. Dx 250.00 non insulin dependent.   GLUCOSE BLOOD (FREESTYLE TEST STRIPS) TEST STRIP  Use to check blood sugar 1-2 times per day. Dx code 250.00 non insulin dependent.   KETOCONAZOLE (NIZORAL) 2 % CREAM    APPLY TOPICALLY DAILY.   MAGNESIUM HYDROXIDE (PHILLIPS CHEWS) 311 MG CHEW CHEWABLE TABLET    Chew 311 mg by mouth daily as needed (for constipation).   METFORMIN (GLUCOPHAGE) 500 MG TABLET    Take 1 tablet (500 mg total) by mouth daily with breakfast.   MULTIPLE VITAMIN (MULTIVITAMIN) TABLET    Take 1 tablet by mouth daily.     POLYETHYL GLYCOL-PROPYL GLYCOL (SYSTANE OP)    Apply 1 drop to eye daily as needed (for dry eyes).   RAMIPRIL (ALTACE) 5 MG CAPSULE    TAKE ONE CAPSULE BY MOUTH EVERY  DAY   SIMVASTATIN (ZOCOR) 40 MG TABLET    TAKE 1 TABLET BY MOUTH AT BEDTIME  Modified Medications   No medications on file  Discontinued Medications   ACETAMINOPHEN (TYLENOL) 325 MG TABLET    Take 650 mg by mouth every 6 (six) hours as needed for mild pain, moderate pain or fever.   RAMIPRIL (ALTACE) 5 MG CAPSULE    Take 5 mg by mouth daily.   TRAMADOL (ULTRAM) 50 MG TABLET    Take 1 tablet (50 mg total) by mouth every 6 (six) hours as needed.

## 2014-04-08 NOTE — Progress Notes (Signed)
Pre visit review using our clinic review tool, if applicable. No additional management support is needed unless otherwise documented below in the visit note. 

## 2014-04-10 ENCOUNTER — Encounter: Payer: Self-pay | Admitting: Family Medicine

## 2014-04-10 ENCOUNTER — Ambulatory Visit (INDEPENDENT_AMBULATORY_CARE_PROVIDER_SITE_OTHER): Payer: Medicare Other | Admitting: Family Medicine

## 2014-04-10 VITALS — BP 120/64 | HR 79 | Temp 98.5°F | Ht 63.0 in | Wt 134.5 lb

## 2014-04-10 DIAGNOSIS — L84 Corns and callosities: Secondary | ICD-10-CM | POA: Diagnosis not present

## 2014-04-10 DIAGNOSIS — M1A9XX0 Chronic gout, unspecified, without tophus (tophi): Secondary | ICD-10-CM

## 2014-04-10 DIAGNOSIS — E119 Type 2 diabetes mellitus without complications: Secondary | ICD-10-CM

## 2014-04-10 DIAGNOSIS — G4700 Insomnia, unspecified: Secondary | ICD-10-CM | POA: Diagnosis not present

## 2014-04-10 DIAGNOSIS — I10 Essential (primary) hypertension: Secondary | ICD-10-CM | POA: Diagnosis not present

## 2014-04-10 MED ORDER — RAMIPRIL 5 MG PO CAPS: 5.0000 mg | ORAL_CAPSULE | Freq: Every day | ORAL | Status: DC

## 2014-04-10 NOTE — Patient Instructions (Addendum)
Make last drink of anything several hours before bed. Can try melatonin OTC 3 mg  To help reset clock. Do not nap during the day. Review sleep hygeine.  Stop at front desk for referral to podiatrist. Make sure to eat three meals a day with snacks in between to avoid low blood sugars

## 2014-04-10 NOTE — Progress Notes (Signed)
80 year old female presents for 6 months follow up. IN last 6 months she has been seen for hypoglycemia, dizziness, UTI, fall at home,  And left knee pain.   Today she reports she had recurrent dizziness episode 2 days ago. Saw Dr. Loletha Grayer on 12/16.. Felt it was BPPV. Resolved now but she is tired overall. Waking up in middle of the night to pee and having trouble going back to sleep. She has been napping during the day.  She has noted bone sticking out on left heel more so in last few months, not as  as in past.  ( it is a callus)  Diabetes: On low dose metformin worsening control over time, but still fairly well controlled.  Lab Results  Component Value Date   HGBA1C 6.7* 04/03/2014  Using medications without difficulties:  Hypoglycemic episodes: 58, 66 Hyperglycemic episodes:None  Feet problems:None  Blood Sugars averaging: 142-173 eye exam within last year:   Wt Readings from Last 3 Encounters:  04/10/14 134 lb 8 oz (61.009 kg)  04/08/14 137 lb 8 oz (62.37 kg)  02/18/14 136 lb 12 oz (62.029 kg)     Elevated Cholesterol: Previously at goal on simvastatin 40 mg daily, fish oil.  Lab Results  Component Value Date   CHOL 149 04/03/2014   HDL 55.60 04/03/2014   LDLCALC 77 04/03/2014   TRIG 82.0 04/03/2014   CHOLHDL 3 04/03/2014  Using medications without problems: yes  Muscle aches: None  Other complaints:   Gout: no further gout flares. Used colchicine as needed.  Uric acid in nml range in 04/2013  Hypertension: Well controlled on ramipril  BP Readings from Last 3 Encounters:  04/10/14 120/64  04/08/14 120/72  02/18/14 100/60  Using medication without problems or lightheadedness:  Chest pain with exertion: none  Edema:None  Short of breath:None  Average home BPs: not checking BP  Other issues:   Review of Systems  Constitutional: Negative for fever and fatigue.  HENT: Negative for ear pain.  Eyes: Negative for pain.  Respiratory: Negative for cough,  chest tightness and shortness of breath.  Cardiovascular: Negative for chest pain, palpitations and leg swelling.  Gastrointestinal: Negative for nausea, diarrhea, occ constipation, straining with BMs blood in stool and rectal pain.  Genitourinary: Negative for dysuria, vaginal bleeding, vaginal discharge, vaginal pain and pelvic pain.  Neurological: Negative for tremors, syncope, weakness and numbness.  Psychiatric/Behavioral: Negative for self-injury, dysphoric mood and agitation. The patient is not nervous/anxious.  Objective:   Physical Exam  Constitutional: Vital signs are normal. She appears well-developed and well-nourished. She is cooperative. Non-toxic appearance. She does not appear ill. No distress.  Elderly female in NAD  HENT:  Head: Normocephalic.  Right Ear: Hearing, tympanic membrane, external ear and ear canal normal.  Left Ear: Hearing, tympanic membrane, external ear and ear canal normal.  Nose: Nose normal.  Eyes: Conjunctivae, EOM and lids are normal. Pupils are equal, round, and reactive to light. No foreign bodies found.  Neck: Trachea normal and normal range of motion. Neck supple. Carotid bruit is not present. No mass and no thyromegaly present.  Cardiovascular: Normal rate, regular rhythm, S1 normal, S2 normal, normal heart sounds and intact distal pulses. Exam reveals no gallop.  No murmur heard.  Pulmonary/Chest: Effort normal and breath sounds normal. No respiratory distress. She has no wheezes. She has no rhonchi. She has no rales.  Abdominal: Soft. Normal appearance and bowel sounds are normal. She exhibits no distension, no fluid wave, no abdominal  bruit and no mass. There is no hepatosplenomegaly. There is no tenderness. There is no rebound, no guarding and no CVA tenderness. No hernia.  Lymphadenopathy:  She has no cervical adenopathy.  She has no axillary adenopathy.  Neurological: She is alert. She has normal strength. No cranial nerve  deficit or sensory deficit.  Skin: Skin is warm, dry and intact. No rash noted.  Psychiatric: Her speech is normal and behavior is normal. Judgment normal. Her mood appears not anxious. Cognition and memory are normal. She does not exhibit a depressed mood.   MSK: decreased ROM B hip , no pain.  Diabetic foot exam:  Normal inspection  No skin breakdown  Callus on posterior heel on left heel Normal DP pulses  Normal sensation to light touch and monofilament  Nails normal

## 2014-04-10 NOTE — Progress Notes (Signed)
Pre visit review using our clinic review tool, if applicable. No additional management support is needed unless otherwise documented below in the visit note. 

## 2014-04-13 ENCOUNTER — Other Ambulatory Visit: Payer: Self-pay | Admitting: Family Medicine

## 2014-04-21 ENCOUNTER — Other Ambulatory Visit: Payer: Self-pay | Admitting: Family Medicine

## 2014-05-04 ENCOUNTER — Ambulatory Visit (INDEPENDENT_AMBULATORY_CARE_PROVIDER_SITE_OTHER): Payer: Medicare Other | Admitting: Podiatry

## 2014-05-04 ENCOUNTER — Ambulatory Visit: Payer: Self-pay | Admitting: Podiatry

## 2014-05-04 ENCOUNTER — Encounter: Payer: Self-pay | Admitting: Podiatry

## 2014-05-04 VITALS — BP 175/95 | HR 83 | Resp 13 | Ht 63.0 in | Wt 138.0 lb

## 2014-05-04 DIAGNOSIS — B351 Tinea unguium: Secondary | ICD-10-CM

## 2014-05-04 DIAGNOSIS — M79676 Pain in unspecified toe(s): Secondary | ICD-10-CM | POA: Diagnosis not present

## 2014-05-04 NOTE — Progress Notes (Signed)
   Subjective:    Patient ID: [name], female    DOB: 06-07-1922, 81 y.o.   MRN: [id number]  HPI Comments: N debridement L 10 toenails D and O long-term C elongated, thickened toenails A enclosed shoes, difficult to cut and diabetes T hx of [facility] foot care 03/11/2012  The last visit in our office for this patient was 03/11/2012. She states that she is been attempting to cover toenails herself since the last visit.   Review of Systems  All other systems reviewed and are negative.      Objective:   Physical Exam  Orientated 3  Vascular: DP pulses 2/4 bilaterally PT pulses 2/4 bilaterally Capillary reflex immediate bilaterally  Neurological: Sensation to 10 g monofilament wire intact 4/5 right and 5/5 left Ankle reflex equal and reactive bilaterally Vibratory sensation nonreactive bilaterally  Dermatological: Atrophic skin without any hair growth bilaterally The toenails are extremely elongated, hypertrophic, discolored, deformed and tender to palpation up to approximately 1 inch in thickness.  Musculoskeletal: HAV deformity right Pes planus bilaterally No restriction ankle, subtalar, midtarsal joints bilaterally       Assessment & Plan:   Assessment: Satisfactory vascular status Protective sensation intact bilaterally Extremely neglected symptomatic onychomycoses 6-10  Plan: Debridement toenails 10 without a bleeding  Recommend reappoint visit at three-month intervals

## 2014-05-04 NOTE — Patient Instructions (Signed)
Diabetes and Foot Care Diabetes may cause you to have problems because of poor blood supply (circulation) to your feet and legs. This may cause the skin on your feet to become thinner, break easier, and heal more slowly. Your skin may become dry, and the skin may peel and crack. You may also have nerve damage in your legs and feet causing decreased feeling in them. You may not notice minor injuries to your feet that could lead to infections or more serious problems. Taking care of your feet is one of the most important things you can do for yourself.  HOME CARE INSTRUCTIONS  Wear shoes at all times, even in the house. Do not go barefoot. Bare feet are easily injured.  Check your feet daily for blisters, cuts, and redness. If you cannot see the bottom of your feet, use a mirror or ask someone for help.  Wash your feet with warm water (do not use hot water) and mild soap. Then pat your feet and the areas between your toes until they are completely dry. Do not soak your feet as this can dry your skin.  Apply a moisturizing lotion or petroleum jelly (that does not contain alcohol and is unscented) to the skin on your feet and to dry, brittle toenails. Do not apply lotion between your toes.  Trim your toenails straight across. Do not dig under them or around the cuticle. File the edges of your nails with an emery board or nail file.  Do not cut corns or calluses or try to remove them with medicine.  Wear clean socks or stockings every day. Make sure they are not too tight. Do not wear knee-high stockings since they may decrease blood flow to your legs.  Wear shoes that fit properly and have enough cushioning. To break in new shoes, wear them for just a few hours a day. This prevents you from injuring your feet. Always look in your shoes before you put them on to be sure there are no objects inside.  Do not cross your legs. This may decrease the blood flow to your feet.  If you find a minor scrape,  cut, or break in the skin on your feet, keep it and the skin around it clean and dry. These areas may be cleansed with mild soap and water. Do not cleanse the area with peroxide, alcohol, or iodine.  When you remove an adhesive bandage, be sure not to damage the skin around it.  If you have a wound, look at it several times a day to make sure it is healing.  Do not use heating pads or hot water bottles. They may burn your skin. If you have lost feeling in your feet or legs, you may not know it is happening until it is too late.  Make sure your health care provider performs a complete foot exam at least annually or more often if you have foot problems. Report any cuts, sores, or bruises to your health care provider immediately. SEEK MEDICAL CARE IF:   You have an injury that is not healing.  You have cuts or breaks in the skin.  You have an ingrown nail.  You notice redness on your legs or feet.  You feel burning or tingling in your legs or feet.  You have pain or cramps in your legs and feet.  Your legs or feet are numb.  Your feet always feel cold. SEEK IMMEDIATE MEDICAL CARE IF:   There is increasing redness,   swelling, or pain in or around a wound.  There is a red line that goes up your leg.  Pus is coming from a wound.  You develop a fever or as directed by your health care provider.  You notice a bad smell coming from an ulcer or wound. Document Released: 04/07/2000 Document Revised: 12/11/2012 Document Reviewed: 09/17/2012 ExitCare Patient Information 2015 [employer]. This information is not intended to replace advice given to you by your health care provider. Make sure you discuss any questions you have with your health care provider.  

## 2014-05-05 NOTE — Assessment & Plan Note (Signed)
Well controlled. Continue current medication.  

## 2014-05-05 NOTE — Assessment & Plan Note (Signed)
Make last drink of anything several hours before bed. Can try melatonin OTC 3 mg  To help reset clock. Do not nap during the day. Review sleep hygeine.

## 2014-05-05 NOTE — Assessment & Plan Note (Signed)
No flares in last year 

## 2014-05-05 NOTE — Assessment & Plan Note (Signed)
Area cleaned with alcohol and debrided dead tissue with 11 blade scapel. No bleeding, no pain. No complications.

## 2014-05-28 ENCOUNTER — Telehealth: Payer: Self-pay | Admitting: Family Medicine

## 2014-05-28 NOTE — Telephone Encounter (Signed)
[facility] Patient Name: [name] DOB: [birth year] Initial Comment Caller States she has gout in her right wrist, painful, wants to know if there something she should be doing special for it. Nurse Assessment Nurse: Donalynn Furlong, RN, Myna Hidalgo Date/Time [name] Time): 05/28/2014 10:11:20 AM Confirm and document reason for call. If symptomatic, describe symptoms. ---Caller States she has gout in her right wrist, painful, wants to know if there something she should be doing special for it. Has the patient traveled out of the country within the last 30 days? ---No Does the patient require triage? ---Yes Related visit to physician within the last 2 weeks? ---No Does the PT have any chronic conditions? (i.e. diabetes, asthma, etc.) ---Yes List chronic conditions. ---Gout, diabetes, high blood pressure, high cholesterol Guidelines Guideline Title Affirmed Question Affirmed Notes Hand and Wrist Pain Hand or wrist pain (all triage questions negative) Final Disposition User [place], RN, Myna Hidalgo Comments also recommended warm compresses every 2-3 hrs to the wrist as well

## 2014-05-29 ENCOUNTER — Ambulatory Visit: Payer: Self-pay | Admitting: Family Medicine

## 2014-05-29 ENCOUNTER — Telehealth: Payer: Self-pay | Admitting: Family Medicine

## 2014-05-29 MED ORDER — INDOMETHACIN 50 MG PO CAPS: 50.0000 mg | ORAL_CAPSULE | Freq: Three times a day (TID) | ORAL | Status: DC

## 2014-05-29 MED ORDER — COLCHICINE 0.6 MG PO TABS: ORAL_TABLET | ORAL | Status: DC

## 2014-05-29 NOTE — Telephone Encounter (Signed)
Noted  

## 2014-05-29 NOTE — Telephone Encounter (Addendum)
Received fax from [facility] stating colchicine 0.6 mg is not covered by insurance.  Per Dr. Diona Browner prescription for indomethacin 50 mg sent to pharmacy.

## 2014-05-29 NOTE — Telephone Encounter (Signed)
Left message for [name] to return my call.

## 2014-05-29 NOTE — Telephone Encounter (Signed)
Stop ibuprofen  Start and call in Indomethacin 50 mg, 1 po TID, #30, 0 ref Continue this until I see her on Monday

## 2014-05-29 NOTE — Telephone Encounter (Signed)
Patient Name: [name]  DOB: 1922/10/07    Initial Comment caller states she has gout in her wrist and has questions about medication   Nurse Assessment  Nurse: Wynetta Emery, RN, Baker Janus Date/Time [name] Time): 05/29/2014 9:15:45 AM  Confirm and document reason for call. If symptomatic, describe symptoms. ---[name] has gout in right wrist and been taking tylenol and iburprofen -- pain is not present but swelling continue took two advil every 4 hours has taking 4 so far.  Has the patient traveled out of the country within the last 30 days? ---No  Does the patient require triage? ---Yes  Related visit to physician within the last 2 weeks? ---No  Does the PT have any chronic conditions? (i.e. diabetes, asthma, etc.) ---Yes  List chronic conditions. ---HTN Hyperlipidemia     Guidelines    Guideline Title Affirmed Question Affirmed Notes  Hand and Wrist Pain Hand or wrist pain (all triage questions negative)    Final Disposition User   See Physician within [place], Therapist, sports, Baker Janus

## 2014-05-29 NOTE — Addendum Note (Signed)
Addended by: Carter Kitten on: 05/29/2014 03:52 PM   Modules accepted: Orders

## 2014-05-29 NOTE — Telephone Encounter (Signed)
Pt has appt on 06/01/14 at 11AM with Dr Lorelei Pont.

## 2014-05-29 NOTE — Telephone Encounter (Signed)
[name] came in today because [facility] told her she had an appointment today with Dr. Lorelei Pont at 11:00 am.  I called [name] back and advised her that Dr. Diona Browner sent in a prescription for colchicine for her gout.  Instruction were written down for her on how to take the medication.  Also advised while she is taking the colchicine, she needed to hold her simvastatin.  She also wanted to know about taking Ibuprofen for the gout.  Advised she should only take ibuprofen if she is having pain.  She states currently she was not having any pain.  She was also concerned about what she should eat.  She still had a printout from a previous visit for gout that gave information about what food and drink she should limit so I just went over that with her again.  She is concern because her daughter's birthday is Sunday and they are suppose to go out to eat and wanted to know about eating a steak or some shrimp.  I advised that she should be just fine eating steak and shrimp on this special occasion.  I told [name] to start the colchicine and see how she does over the weekend.  If still having issues on Monday, advised to call the office and we would get her seen.  Patient states understanding.

## 2014-05-29 NOTE — Telephone Encounter (Signed)
Sent in rx for colchicine. Holds statin while on.  If this does not help with symptoms, make appt to be seen.

## 2014-05-29 NOTE — Telephone Encounter (Signed)
Called to inform [name] about colchicine not being covered by insurance and a new prescription for indomethacin has been sent to her pharmacy.  [name] states she already paid $126 for the colchicine and started taking it.  I advised to continue with the colchicine and I called [facility] at [address]  and cancelled the prescription for indomethacin.

## 2014-05-29 NOTE — Telephone Encounter (Signed)
PLEASE NOTE: All timestamps contained within this report are represented as [place] Standard Time. CONFIDENTIALTY NOTICE: This fax transmission is intended only for the addressee. It contains information that is legally privileged, confidential or otherwise protected from use or disclosure. If you are not the intended recipient, you are strictly prohibited from reviewing, disclosing, copying using or disseminating any of this information or taking any action in reliance on or regarding this information. If you have received this fax in error, please notify us immediately by telephone so that we can arrange for its return to [place]. Phone: [phone], Toll-Free: [phone], Fax: [phone] Page: 1 of 2 Call Id: [id number] [place] Patient Name: [name] Gender: Female DOB: May 03, 1922 Age: 81 Y 78 M 19 D Return Phone Number: [phone] (Primary) Address: [address] City/State/Zip: [place] [address] Client [facility] - Client Client Site [facility] - Day Physician Diona Browner, [place] Contact Type Call Call Type Triage / Clinical Relationship To Patient Self Appointment Disposition EMR Appointment Scheduled Return Phone Number [phone] (Primary) Chief Complaint Hand or Wrist Injury Initial Comment caller states she has gout in her wrist and has questions about medication PreDisposition Call Doctor Info pasted into Epic Yes Nurse Assessment Nurse: Wynetta Emery, RN, Baker Janus Date/Time [name] Time): 05/29/2014 9:15:45 AM Confirm and document reason for call. If symptomatic, describe symptoms. ---[name] has gout in right wrist and been taking tylenol and iburprofen -- pain is not present but swelling continue took two advil every 4 hours has taking 4 so far. Has the patient traveled out of the country within the last 30 days? ---No Does the  patient require triage? ---Yes Related visit to physician within the last 2 weeks? ---No Does the PT have any chronic conditions? (i.e. diabetes, asthma, etc.) ---Yes List chronic conditions. ---HTN Hyperlipidemia Guidelines Guideline Title Affirmed Question Affirmed Notes Nurse Date/Time [name] Time) Hand and Wrist Pain Hand or wrist pain (all triage questions negative) Wynetta Emery, RN, Baker Janus 05/29/2014 9:20:59 AM Disp. Time [name] Time) Disposition Final User 05/29/2014 8:46:01 AM Send To Clinical Follow Up Jeral Fruit 05/29/2014 9:24:01 AM See Physician within 24 Hours Yes Wynetta Emery, RN, Baker Janus Disposition Overriden: Home Care Override Reason: Patient's symptoms need a higher level of care Caller Understands: Yes Disagree/Comply: Comply PLEASE NOTE: All timestamps contained within this report are represented as [place] Standard Time. CONFIDENTIALTY NOTICE: This fax transmission is intended only for the addressee. It contains information that is legally privileged, confidential or otherwise protected from use or disclosure. If you are not the intended recipient, you are strictly prohibited from reviewing, disclosing, copying using or disseminating any of this information or taking any action in reliance on or regarding this information. If you have received this fax in error, please notify us immediately by telephone so that we can arrange for its return to [place]. Phone: [phone], Toll-Free: [phone], Fax: [phone] Page: 2 of 2 Call Id: [id number] Care Advice Given Per Guideline SEE PHYSICIAN WITHIN 24 HOURS: * Please bring a list of your current medicines when you go to see the doctor. * It is also a good idea to bring the pill bottles too. This will help the doctor to make certain you are taking the right medicines and the right dose. CALL BACK IF: After Care Instructions Given Call Event Type User Date / Time Description Comments User: Michele Rockers, RN Date/Time [name]  Time): 05/29/2014 9:42:58 AM 1100am  given with Dr. Lorelei Pont Referrals REFERRED TO PCP OFFICE

## 2014-05-29 NOTE — Telephone Encounter (Signed)
Dr. Diona Browner has already sent in colchicine.  See phone note from 05/28/2014.

## 2014-06-01 ENCOUNTER — Ambulatory Visit: Payer: Self-pay | Admitting: Family Medicine

## 2014-06-01 NOTE — Telephone Encounter (Signed)
Patient is asking for Butch Penny to call her at [phone].  You can call her before 10:30 or after 12:30.

## 2014-06-01 NOTE — Telephone Encounter (Signed)
Hold statin until off colchicine.   Keep taking colchicine the next few days. Resume statin when off colchicine.

## 2014-06-01 NOTE — Telephone Encounter (Signed)
PLEASE NOTE: All timestamps contained within this report are represented as [place] Standard Time. CONFIDENTIALTY NOTICE: This fax transmission is intended only for the addressee. It contains information that is legally privileged, confidential or otherwise protected from use or disclosure. If you are not the intended recipient, you are strictly prohibited from reviewing, disclosing, copying using or disseminating any of this information or taking any action in reliance on or regarding this information. If you have received this fax in error, please notify us immediately by telephone so that we can arrange for its return to [place]. Phone: [phone], Toll-Free: [phone], Fax: [phone] Page: 1 of 2 Call Id: [id number] [place] Patient Name: [name] Gender: Female DOB: December 23, 1922 Age: 81 Y 9 M 20 D Return Phone Number: [phone] (Primary) Address: City/State/Zip: [place] Client [facility] - Client Client Site [place] Physician Diona Browner, [place] Contact Type Call Call Type Triage / Clinical Relationship To Patient Self Return Phone Number [phone] (Primary) Chief Complaint POISON - swallowed foreign body, pills or other dangerous substance Initial Comment Caller states she is on colchicine for gout, was told not to take simvastatin with it but accidentally did, feels funny and shakey Nurse Assessment Nurse: Genoveva Ill, RN, Lattie Haw Date/Time (Eastern Time): 05/30/2014 8:15:58 AM Confirm and document reason for call. If symptomatic, describe symptoms. ---Caller states she is on colchicine yesterday for gout, was told not to take simvastatin with it but accidentally did at 11 pm last night, feels funny, dizzy and shaky; asked if has checked BS and caller states she doesn't feel like she can Has the patient traveled out of the  country within the last 30 days? ---No Does the patient require triage? ---Yes Related visit to physician within the last 2 weeks? ---No Does the PT have any chronic conditions? (i.e. diabetes, asthma, etc.) ---Yes List chronic conditions. ---gout, diabetes Guidelines Guideline Title Affirmed Question Affirmed Notes Nurse Date/Time (Eastern Time) Disp. Time [name] Time) Disposition Final User 05/30/2014 8:13:17 AM Send to Urgent Queue Dalia Heading 05/30/2014 8:24:27 AM Send To RN Personal Burress, RN, Lattie Haw 05/30/2014 8:56:42 AM 911 Follow Up Call Attempted Burress, RN, Lattie Haw Reason: paramedics present 05/30/2014 8:56:56 AM Clinical Call Yes Burress, RN, Lattie Haw After Care Instructions Given Call Event Type User Date / Time Description PLEASE NOTE: All timestamps contained within this report are represented as [place] Standard Time. CONFIDENTIALTY NOTICE: This fax transmission is intended only for the addressee. It contains information that is legally privileged, confidential or otherwise protected from use or disclosure. If you are not the intended recipient, you are strictly prohibited from reviewing, disclosing, copying using or disseminating any of this information or taking any action in reliance on or regarding this information. If you have received this fax in error, please notify us immediately by telephone so that we can arrange for its return to [place]. Phone: [phone], Toll-Free: [phone], Fax: [phone] Page: 2 of 2 Call Id: [id number] Comments User: Margaretha Sheffield, RN Date/Time [name] Time): 05/30/2014 8:24:07 AM While attempting to triage, advised how to check pulse and determined it was 78; then, caller states she wants to call 911 and is going to call 911; advised will call back in 5 min.

## 2014-06-01 NOTE — Telephone Encounter (Signed)
[name] notified as instructed by telephone.   °

## 2014-06-01 NOTE — Telephone Encounter (Signed)
[name] states she accidentally took her Simvastatin Friday night after I told her not to while on the colchicine.   Saturday morning she had some dizziness so she called the nurse and they inquired whether or not she had a heart attack so the advised her to call 911.  They came out and told her she was fine.  She has not taken any more colchicine since Friday.  She states her right hand is still a little swollen but she is not having any pain.  Saturday and Sunday she just took Advil. Sunday night she did take her simvastatin.  Wants to know what she should do.  Please Advise.

## 2014-06-03 ENCOUNTER — Other Ambulatory Visit: Payer: Self-pay | Admitting: Family Medicine

## 2014-07-10 ENCOUNTER — Ambulatory Visit (INDEPENDENT_AMBULATORY_CARE_PROVIDER_SITE_OTHER): Payer: Medicare Other | Admitting: Family Medicine

## 2014-07-10 ENCOUNTER — Encounter: Payer: Self-pay | Admitting: Family Medicine

## 2014-07-10 VITALS — BP 148/90 | HR 94 | Temp 97.9°F | Wt 135.5 lb

## 2014-07-10 DIAGNOSIS — R296 Repeated falls: Secondary | ICD-10-CM | POA: Insufficient documentation

## 2014-07-10 DIAGNOSIS — E119 Type 2 diabetes mellitus without complications: Secondary | ICD-10-CM

## 2014-07-10 DIAGNOSIS — L989 Disorder of the skin and subcutaneous tissue, unspecified: Secondary | ICD-10-CM | POA: Insufficient documentation

## 2014-07-10 DIAGNOSIS — R21 Rash and other nonspecific skin eruption: Secondary | ICD-10-CM | POA: Diagnosis not present

## 2014-07-10 DIAGNOSIS — I10 Essential (primary) hypertension: Secondary | ICD-10-CM | POA: Diagnosis not present

## 2014-07-10 DIAGNOSIS — E785 Hyperlipidemia, unspecified: Secondary | ICD-10-CM | POA: Diagnosis not present

## 2014-07-10 LAB — COMPREHENSIVE METABOLIC PANEL
ALK PHOS: 43 U/L (ref 39–117)
ALT: 15 U/L (ref 0–35)
AST: 18 U/L (ref 0–37)
Albumin: 3.9 g/dL (ref 3.5–5.2)
BILIRUBIN TOTAL: 0.5 mg/dL (ref 0.2–1.2)
BUN: 29 mg/dL — AB (ref 6–23)
CO2: 29 mEq/L (ref 19–32)
CREATININE: 0.97 mg/dL (ref 0.40–1.20)
Calcium: 9.6 mg/dL (ref 8.4–10.5)
Chloride: 105 mEq/L (ref 96–112)
GFR: 57.41 mL/min — AB (ref >60.00)
Glucose, Bld: 83 mg/dL (ref 70–99)
Potassium: 4.3 mEq/L (ref 3.5–5.1)
Sodium: 137 mEq/L (ref 135–145)
Total Protein: 6.9 g/dL (ref 6.0–8.3)

## 2014-07-10 LAB — HEMOGLOBIN A1C: Hgb A1c MFr Bld: 6.4 % (ref 4.6–6.5)

## 2014-07-10 LAB — HM DIABETES FOOT EXAM

## 2014-07-10 MED ORDER — TRIAMCINOLONE ACETONIDE 0.5 % EX CREA: 1.0000 "application " | TOPICAL_CREAM | Freq: Two times a day (BID) | CUTANEOUS | Status: AC

## 2014-07-10 NOTE — Assessment & Plan Note (Signed)
Improved seborrheic derm with ketoconazole, but other facial rash on cheeks  Not improved. Unclear cause.. Will treat with 2 weeks of steroid cream. If not improving refer to [facility].

## 2014-07-10 NOTE — Assessment & Plan Note (Signed)
Well controlled. Continue current medication.  

## 2014-07-10 NOTE — Progress Notes (Signed)
Pre visit review using our clinic review tool, if applicable. No additional management support is needed unless otherwise documented below in the visit note. 

## 2014-07-10 NOTE — Assessment & Plan Note (Signed)
Encouraged pt to use four prolongued cane or walker at home and out instead of plain cane.

## 2014-07-10 NOTE — Progress Notes (Signed)
81 year old female presents for 3 month follow up DM.  Rash on face continued x few years.  Red patches on cheeks. Waxy flakiness at nasal edges, eyebrows better  She has been having some throat pain and nasal congestion, post nasal drip.  Ongoing x few days. Occ cough Using flonase helps some. She feels it is due to allergies.  No fever, no sob.  No sick contacts.   Diabetes: On low dose metformin worsening control over time, but still fairly well controlled.  Lab Results  Component Value Date   HGBA1C 6.7* 04/03/2014  Using medications without  problem Hypoglycemic episodes:None Hyperglycemic episodes:None Feet problems: No ulcers, Sees poditrist for toenail cutting. Blood Sugars averaging:104-160 eye exam within last year:    Elevated Cholesterol: Previously at goal on simvastatin 40 mg daily, fish oil.  .Using medications without problems: yes  Muscle aches: None  Other complaints:   Gout: no further gout flares. Used colchicine as needed.  Uric acid in nml range in 04/2013  Hypertension: Well controlled on ramipril  BP Readings from Last 3 Encounters:  07/10/14 148/90  05/04/14 175/95  04/10/14 120/64  Using medication without problems or lightheadedness:  Chest pain with exertion: none  Edema:None  Short of breath:None  Average home BPs: not checking BP Other issues:   Review of Systems  Constitutional: Negative for fever and fatigue.  HENT: Negative for ear pain.  Eyes: Negative for pain.  Respiratory: Negative for cough, chest tightness and shortness of breath.  Cardiovascular: Negative for chest pain, palpitations and leg swelling.  Gastrointestinal: Negative for nausea, diarrhea, occ constipation, straining with BMs blood in stool and rectal pain.  Genitourinary: Negative for dysuria, vaginal bleeding, vaginal discharge, vaginal pain and pelvic pain.  Neurological: Negative for tremors, syncope, weakness and numbness.   Psychiatric/Behavioral: Negative for self-injury, dysphoric mood and agitation. The patient is not nervous/anxious.  Objective:   Physical Exam  Constitutional: Vital signs are normal. She appears well-developed and well-nourished. She is cooperative. Non-toxic appearance. She does not appear ill. No distress.  Elderly female in NAD  HENT:  Head: Normocephalic.  Right Ear: Hearing, tympanic membrane, external ear and ear canal normal.  Left Ear: Hearing, tympanic membrane, external ear and ear canal normal.  Nose: Nose normal.  Eyes: Conjunctivae, EOM and lids are normal. Pupils are equal, round, and reactive to light. No foreign bodies found.  Neck: Trachea normal and normal range of motion. Neck supple. Carotid bruit is not present. No mass and no thyromegaly present.  Cardiovascular: Normal rate, regular rhythm, S1 normal, S2 normal, normal heart sounds and intact distal pulses. Exam reveals no gallop.  No murmur heard.  Pulmonary/Chest: Effort normal and breath sounds normal. No respiratory distress. She has no wheezes. She has no rhonchi. She has no rales.  Abdominal: Soft. Normal appearance and bowel sounds are normal. She exhibits no distension, no fluid wave, no abdominal bruit and no mass. There is no hepatosplenomegaly. There is no tenderness. There is no rebound, no guarding and no CVA tenderness. No hernia.  Lymphadenopathy:  She has no cervical adenopathy.  She has no axillary adenopathy.  Neurological: She is alert. She has normal strength. No cranial nerve deficit or sensory deficit.  Skin: Skin is warm, dry and intact.  Dry erythematous plaques on bilateral cheeks and under nose. Some telangectasia noted in rash. Psychiatric: Her speech is normal and behavior is normal. Judgment normal. Her mood appears not anxious. Cognition and memory are normal. She does  not exhibit a depressed mood.   Diabetic foot exam:  Normal inspection  No skin breakdown  Callus  on  Medial great toe. Normal DP pulses  Normal sensation to light touch and monofilament  Nails thickened and deforemed.   Nails trimmed with clippers today

## 2014-07-10 NOTE — Assessment & Plan Note (Signed)
Due for re-eval. 

## 2014-07-10 NOTE — Assessment & Plan Note (Signed)
Well controlled last check.

## 2014-07-10 NOTE — Patient Instructions (Addendum)
Stop at lab on way out. We will call with results. Trial of topical steroid on rash on cheeks x 2 weeks, call if not improving as expected.  Continue flonase but add claritin  OTC daily for allergies.

## 2014-07-13 ENCOUNTER — Telehealth: Payer: Self-pay | Admitting: Family Medicine

## 2014-07-13 NOTE — Telephone Encounter (Signed)
emmi emailed °

## 2014-07-14 ENCOUNTER — Telehealth: Payer: Self-pay | Admitting: Family Medicine

## 2014-07-14 NOTE — Telephone Encounter (Signed)
Please call pt.. If ST worsening she may need to be seen but it has not been long enough on flonase and claritin to treat allergies that may be causing. Can also try zyrtec or allegra at bedtime instead.

## 2014-07-14 NOTE — Telephone Encounter (Signed)
Ms. Disbrow notified as instructed by telephone.  She thinks her symptoms are a little better today.  Just started the Claritin on Sunday.  Advised it has not been long enough for the Claritin and Flonase to work.  Recommended to give the medication a little more time to work.   Advised to call us back on Thursday for an appointment if her sore throat worsens or she starts running a fever.  Patient states understanding.

## 2014-07-14 NOTE — Telephone Encounter (Signed)
Pt was in last Friday she had a sore throat  She has been taking clartin  Pt still has sore throat and she is sleeping a lot She has questions about a throat infection Please advise pt

## 2014-07-20 ENCOUNTER — Telehealth: Payer: Self-pay | Admitting: Family Medicine

## 2014-07-20 NOTE — Telephone Encounter (Signed)
Please call to check on pt. How is blood sugar now?

## 2014-07-20 NOTE — Telephone Encounter (Signed)
PLEASE NOTE: All timestamps contained within this report are represented as [place] Standard Time. CONFIDENTIALTY NOTICE: This fax transmission is intended only for the addressee. It contains information that is legally privileged, confidential or otherwise protected from use or disclosure. If you are not the intended recipient, you are strictly prohibited from reviewing, disclosing, copying using or disseminating any of this information or taking any action in reliance on or regarding this information. If you have received this fax in error, please notify us immediately by telephone so that we can arrange for its return to [place]. Phone: [phone], Toll-Free: [phone], Fax: [phone] Page: 1 of 1 Call Id: [id number] [place] Patient Name: [name] DOB: May 05, 1922 Initial Comment Caller states BS is 297, fatigue, on metformin; Nurse Assessment Nurse: Ronnald Ramp, RN, [name] Date/Time (Eastern Time): 07/20/2014 10:02:02 AM Confirm and document reason for call. If symptomatic, describe symptoms. ---Caller states her BS is 297 (normally < 150) and having fatigue. Has the patient traveled out of the country within the last 30 days? ---Not Applicable Does the patient require triage? ---Yes Related visit to physician within the last 2 weeks? ---No Does the PT have any chronic conditions? (i.e. diabetes, asthma, etc.) ---Yes List chronic conditions. ---Diabetes, HTN, High Cholesterol Guidelines Guideline Title Affirmed Question Affirmed Notes Diabetes - High Blood Sugar [1] Blood glucose > 240 mg/dl (13 mmol/ l) AND [2] does not use insulin (e.g., not insulin-dependent; most type 2 diabetics) (all triage questions negative) Final Disposition User Home Care Ronnald Ramp, RN, Jeannetta Nap

## 2014-07-20 NOTE — Telephone Encounter (Signed)
PLEASE NOTE: All timestamps contained within this report are represented as [place] Standard Time. CONFIDENTIALTY NOTICE: This fax transmission is intended only for the addressee. It contains information that is legally privileged, confidential or otherwise protected from use or disclosure. If you are not the intended recipient, you are strictly prohibited from reviewing, disclosing, copying using or disseminating any of this information or taking any action in reliance on or regarding this information. If you have received this fax in error, please notify us immediately by telephone so that we can arrange for its return to [place]. Phone: [phone], Toll-Free: [phone], Fax: [phone] Page: 1 of 2 Call Id: [id number] [place] Patient Name: [name] Gender: Female DOB: 1922/10/28 Age: 81 Y 66 M 11 D Return Phone Number: [phone] (Primary) Address: [address] City/State/Zip: [place] [address] Client [facility] - Client Client Site [facility] - Day Physician Diona Browner, [place] Contact Type Call Call Type Triage / Clinical Relationship To Patient Self Appointment Disposition EMR Appointment Not Necessary Info pasted into Epic Yes Return Phone Number [phone] (Primary) Chief Complaint Blood Sugar High Initial Comment Caller states BS is 297, fatigue, on metformin; PreDisposition Call Doctor Nurse Assessment Nurse: Ronnald Ramp, RN, [name] Date/Time (Eastern Time): 07/20/2014 10:02:02 AM Confirm and document reason for call. If symptomatic, describe symptoms. ---Caller states her BS is 297 (normally < 150) and having fatigue. Has the patient traveled out of the country within the last 30 days? ---Not Applicable Does the patient require triage? ---Yes Related visit to physician within the last 2 weeks? ---No Does the PT have any  chronic conditions? (i.e. diabetes, asthma, etc.) ---Yes List chronic conditions. ---Diabetes, HTN, High Cholesterol Guidelines Guideline Title Affirmed Question Affirmed Notes Nurse Date/Time (Eastern Time) Diabetes - High Blood Sugar [1] Blood glucose > 240 mg/dl (13 mmol/l) AND [2] does not use insulin (e.g., not insulindependent; most type 2 diabetics) (all triage questions negative) Ronnald Ramp, RN, [name] 07/20/2014 10:04:31 AM Disp. Time [name] Time) Disposition Final User 07/20/2014 10:08:45 AM Home Care Yes Ronnald Ramp, RN, [name] Understands: Yes PLEASE NOTE: All timestamps contained within this report are represented as [place] Standard Time. CONFIDENTIALTY NOTICE: This fax transmission is intended only for the addressee. It contains information that is legally privileged, confidential or otherwise protected from use or disclosure. If you are not the intended recipient, you are strictly prohibited from reviewing, disclosing, copying using or disseminating any of this information or taking any action in reliance on or regarding this information. If you have received this fax in error, please notify us immediately by telephone so that we can arrange for its return to [place]. Phone: [phone], Toll-Free: [phone], Fax: [phone] Page: 2 of 2 Call Id: [id number] Disagree/Comply: Comply Care Advice Given Per Guideline HOME CARE: You should be able to treat this at home. HIGH BLOOD SUGAR (HYPERGLYCEMIA): TREATMENT - LIQUIDS: * Generally, you should try to drink 6-8 glasses of water each day. * Drink at least one glass (8 oz) of water per hour for the next 4 hours (Reason: adequate hydration will reduce hyperglycemia). TREATMENT - DIABETES MEDICATIONS: Continue taking your diabetes pills.. EXPECTED COURSE - You should CALL YOUR DOCTOR WITHIN 1-3 DAYS if: * Your blood sugar continues to get above 240 mg/dl (13 mmol/l). MEASURE AND RECORD YOUR BLOOD GLUCOSE: * Measure your blood  glucose before breakfast and before going to bed. CALL BACK IF: * Blood  glucose over 300 mg/dL (16.5 mmol/l), two or more times in a row. * Urine ketones become moderate or large * Vomiting lasting over 4 hours or unable to drink any fluids * You become worse or have more questions. CARE ADVICE given per Diabetes - High Blood Sugar (Adult) guideline. * Your blood sugar continues to be higher than the glucose goals your doctor set for you.It has been longer than 6 months since you had an Hemoglobin A1C test. * Record the results and show them to your doctor at your next office visit. After Care Instructions Given Call Event Type User Date / Time Description

## 2014-07-21 NOTE — Telephone Encounter (Signed)
Spoke with Ms. [name].  She states she drank the water as instructed by the triage nurse.   When she rechecked her sugar last night is was down to 69 mg/dl.  She just checked it again right before returning my call and it was 122 mg/dl.

## 2014-07-21 NOTE — Telephone Encounter (Signed)
Left message for Ms. Montella to return my call.

## 2014-07-21 NOTE — Telephone Encounter (Signed)
Great!

## 2014-07-22 ENCOUNTER — Telehealth: Payer: Self-pay

## 2014-07-22 MED ORDER — FREESTYLE UNISTICK II LANCETS MISC: Status: DC

## 2014-07-22 NOTE — Telephone Encounter (Signed)
Pt request refill of freestyle lancets to [facility].pt notified done.

## 2014-07-23 MED ORDER — FREESTYLE UNISTICK II LANCETS MISC: Status: DC

## 2014-07-23 NOTE — Addendum Note (Signed)
Addended by: Carter Kitten on: 07/23/2014 11:35 AM   Modules accepted: Orders

## 2014-07-23 NOTE — Telephone Encounter (Signed)
[facility] cannot fill lancets b/c of part B medicare. Pt spoke with [facility] and they can fill it for her. Rx also needs to have pt's dx code included.    [facility] [address] in [place]  [phone]

## 2014-07-23 NOTE — Telephone Encounter (Signed)
[name] notified prescription for her lancets have been sent to [facility] on [address]

## 2014-07-24 ENCOUNTER — Telehealth: Payer: Self-pay | Admitting: Family Medicine

## 2014-07-24 MED ORDER — FREESTYLE LANCETS MISC: Status: DC

## 2014-07-24 NOTE — Addendum Note (Signed)
Addended by: Carter Kitten on: 07/24/2014 04:58 PM   Modules accepted: Orders

## 2014-07-24 NOTE — Telephone Encounter (Signed)
Pt letting us know that she was finally able to get lancets from [facility].  She cancelled the order from [facility].  She said everything is okay.

## 2014-08-10 ENCOUNTER — Ambulatory Visit: Payer: PRIVATE HEALTH INSURANCE | Admitting: Podiatry

## 2014-08-11 ENCOUNTER — Other Ambulatory Visit: Payer: Self-pay | Admitting: *Deleted

## 2014-08-11 MED ORDER — SIMVASTATIN 40 MG PO TABS: 40.0000 mg | ORAL_TABLET | Freq: Every day | ORAL | Status: DC

## 2014-08-11 NOTE — Telephone Encounter (Signed)
Received fax from [facility] requesting we prescribe a 90 day supply on Simvastatin vs a 30 day supply.  New Rx sent to [facility] for a 90 day supply.

## 2014-08-26 ENCOUNTER — Encounter (HOSPITAL_COMMUNITY): Payer: Self-pay

## 2014-08-26 ENCOUNTER — Emergency Department (HOSPITAL_COMMUNITY)
Admission: EM | Admit: 2014-08-26 | Discharge: 2014-08-26 | Disposition: A | Discharge status: Home / Self Care | Payer: Medicare Other | Attending: Emergency Medicine | Admitting: Emergency Medicine

## 2014-08-26 ENCOUNTER — Emergency Department (HOSPITAL_COMMUNITY): Payer: Medicare Other

## 2014-08-26 DIAGNOSIS — Z8709 Personal history of other diseases of the respiratory system: Secondary | ICD-10-CM | POA: Diagnosis not present

## 2014-08-26 DIAGNOSIS — Y998 Other external cause status: Secondary | ICD-10-CM | POA: Diagnosis not present

## 2014-08-26 DIAGNOSIS — K219 Gastro-esophageal reflux disease without esophagitis: Secondary | ICD-10-CM | POA: Insufficient documentation

## 2014-08-26 DIAGNOSIS — Z8742 Personal history of other diseases of the female genital tract: Secondary | ICD-10-CM | POA: Diagnosis not present

## 2014-08-26 DIAGNOSIS — Z8719 Personal history of other diseases of the digestive system: Secondary | ICD-10-CM | POA: Insufficient documentation

## 2014-08-26 DIAGNOSIS — Z7982 Long term (current) use of aspirin: Secondary | ICD-10-CM | POA: Diagnosis not present

## 2014-08-26 DIAGNOSIS — I10 Essential (primary) hypertension: Secondary | ICD-10-CM | POA: Diagnosis not present

## 2014-08-26 DIAGNOSIS — Z7951 Long term (current) use of inhaled steroids: Secondary | ICD-10-CM | POA: Insufficient documentation

## 2014-08-26 DIAGNOSIS — E785 Hyperlipidemia, unspecified: Secondary | ICD-10-CM | POA: Diagnosis not present

## 2014-08-26 DIAGNOSIS — Z8619 Personal history of other infectious and parasitic diseases: Secondary | ICD-10-CM | POA: Diagnosis not present

## 2014-08-26 DIAGNOSIS — S0502XA Injury of conjunctiva and corneal abrasion without foreign body, left eye, initial encounter: Secondary | ICD-10-CM | POA: Insufficient documentation

## 2014-08-26 DIAGNOSIS — M8580 Other specified disorders of bone density and structure, unspecified site: Secondary | ICD-10-CM | POA: Diagnosis not present

## 2014-08-26 DIAGNOSIS — E119 Type 2 diabetes mellitus without complications: Secondary | ICD-10-CM | POA: Diagnosis not present

## 2014-08-26 DIAGNOSIS — Y9389 Activity, other specified: Secondary | ICD-10-CM | POA: Insufficient documentation

## 2014-08-26 DIAGNOSIS — Z794 Long term (current) use of insulin: Secondary | ICD-10-CM | POA: Insufficient documentation

## 2014-08-26 DIAGNOSIS — M1990 Unspecified osteoarthritis, unspecified site: Secondary | ICD-10-CM | POA: Diagnosis not present

## 2014-08-26 DIAGNOSIS — Y92481 Parking lot as the place of occurrence of the external cause: Secondary | ICD-10-CM | POA: Insufficient documentation

## 2014-08-26 DIAGNOSIS — S199XXA Unspecified injury of neck, initial encounter: Secondary | ICD-10-CM | POA: Diagnosis present

## 2014-08-26 DIAGNOSIS — Z87891 Personal history of nicotine dependence: Secondary | ICD-10-CM | POA: Diagnosis not present

## 2014-08-26 DIAGNOSIS — M542 Cervicalgia: Secondary | ICD-10-CM | POA: Diagnosis not present

## 2014-08-26 DIAGNOSIS — H269 Unspecified cataract: Secondary | ICD-10-CM | POA: Insufficient documentation

## 2014-08-26 MED ORDER — TETRACAINE HCL 0.5 % OP SOLN
2.0000 [drp] | Freq: Once | OPHTHALMIC | Status: AC
  Administered 2014-08-26: 2 [drp] via OPHTHALMIC
  Filled 2014-08-26: qty 2

## 2014-08-26 MED ORDER — ERYTHROMYCIN 5 MG/GM OP OINT: TOPICAL_OINTMENT | OPHTHALMIC | Status: DC

## 2014-08-26 MED ORDER — FLUORESCEIN SODIUM 1 MG OP STRP
1.0000 | ORAL_STRIP | Freq: Once | OPHTHALMIC | Status: AC
  Administered 2014-08-26: 1 via OPHTHALMIC
  Filled 2014-08-26: qty 1

## 2014-08-26 NOTE — ED Notes (Addendum)
MEDICATION AT BEDSIDE 
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
## 2014-08-26 NOTE — ED Notes (Signed)
EDPA PRESENT at bedside. 
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
## 2014-08-26 NOTE — ED Notes (Signed)
Pt eye irrigated.
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
## 2014-08-26 NOTE — ED Notes (Signed)
BIL 20/50 RT 20/50 LT 20/50

## 2014-09-01 LAB — HM DIABETES EYE EXAM

## 2014-09-03 DIAGNOSIS — H43393 Other vitreous opacities, bilateral: Secondary | ICD-10-CM | POA: Diagnosis not present

## 2014-09-03 DIAGNOSIS — H35033 Hypertensive retinopathy, bilateral: Secondary | ICD-10-CM | POA: Diagnosis not present

## 2014-09-03 DIAGNOSIS — E119 Type 2 diabetes mellitus without complications: Secondary | ICD-10-CM | POA: Diagnosis not present

## 2014-09-03 DIAGNOSIS — H524 Presbyopia: Secondary | ICD-10-CM | POA: Diagnosis not present

## 2014-09-07 ENCOUNTER — Encounter: Payer: Self-pay | Admitting: Primary Care

## 2014-09-07 ENCOUNTER — Ambulatory Visit (INDEPENDENT_AMBULATORY_CARE_PROVIDER_SITE_OTHER): Payer: Medicare Other | Admitting: Primary Care

## 2014-09-07 ENCOUNTER — Telehealth: Payer: Self-pay | Admitting: *Deleted

## 2014-09-07 VITALS — BP 136/72 | HR 73 | Temp 97.6°F | Ht 63.0 in | Wt 133.0 lb

## 2014-09-07 DIAGNOSIS — E119 Type 2 diabetes mellitus without complications: Secondary | ICD-10-CM

## 2014-09-07 LAB — GLUCOSE, POCT (MANUAL RESULT ENTRY): POC Glucose: 116 mg/dl — AB (ref 70–99)

## 2014-09-07 MED ORDER — ACCU-CHEK AVIVA PLUS W/DEVICE KIT: 1.0000 | PACK | Freq: Every day | Status: DC

## 2014-09-07 MED ORDER — GLUCOSE BLOOD VI STRP: ORAL_STRIP | Status: DC

## 2014-09-07 NOTE — Telephone Encounter (Signed)
I don't know.

## 2014-09-07 NOTE — Assessment & Plan Note (Signed)
Suspect meter malfunction due to age of device and recent reading of 30 without symptoms. New meter and test strips ordered. CBG in office 116 and asymptomatic. Resume Metformin tomorrow. Check sugars later this afternoon to ensure new meter is working. Call if any numbers below 60.

## 2014-09-07 NOTE — Patient Instructions (Signed)
Your blood sugar in the office today was 116 which is good! I've sent a new meter and test strips to your pharmacy which should be covered by your insurance. Please test your blood sugar later this evening to ensure the device is working correctly. Call us if you get any more malfunctions or low readings below 60. You may resume your metformin as directed. It was nice meeting you!

## 2014-09-07 NOTE — Telephone Encounter (Signed)
Patient was in this afternoon and a new prescription for glucometer and strips was sent to pharmacy.  Medicaid is requiring additional information including diagnosis code.  The pharmacy was to send this by fax.  Patient would like a phone call when this is completed so that she can pick up her supplies.

## 2014-09-07 NOTE — Telephone Encounter (Signed)
Can you make sure that the new call a nurse knows that Amy and I take care of each other's patients. They should always put her patients on my schedule and vice versa.   Electronically Signed  By: Owens Loffler, MD On: 09/07/2014 10:33 AM

## 2014-09-07 NOTE — Telephone Encounter (Signed)
I have not seen this form, was it ever faxed? Did someone else take care of this?  Thanks.

## 2014-09-07 NOTE — Progress Notes (Signed)
Pre visit review using our clinic review tool, if applicable. No additional management support is needed unless otherwise documented below in the visit note. 

## 2014-09-07 NOTE — Progress Notes (Signed)
Subjective:    Patient ID: [name], female    DOB: 01/16/1923, 81 y.o.   MRN: [id number]  HPI  [name] is an 81 year old female who presents today with a chief complaint of hypoglycemia. On Saturday she checked her blood sugar and noted it was 30. She immediately consumed 3 tsp of sugar and drank 1/2 cup of orange juice, rechecked and got 321. She denies feeling weak, dizzy, shortness of breath when she saw the result of 30; but felt dizzy/worse after drinking the juice and eating the sugar. Over the past several months she's had sporatic "error" and "apply sample" messages (despite already applying the sample) on her glucometer. She replaced the batteries Sunday afternoon but will continue to get error messages. Her test strips do not expire until 2017. She rechecked her sugar Sunday morning and got a reading of 142. Typically her sugars are running 140's to 150's as evidenced by the recordings in her book.  She is a type 2 diabetic who is managed on metformin 500 mg once daily. She did not take her metformin this morning. Her last hemoglobin A1C was obtained on 07/10/14 and was 6.4. Blood sugar in the office today was 116 and she denies dizziness, weakness, blurred vision.  Review of Systems  Eyes: Negative for visual disturbance.  Respiratory: Negative for shortness of breath.   Cardiovascular: Negative for chest pain.  Endocrine: Negative for polydipsia, polyphagia and polyuria.  Neurological: Negative for dizziness, light-headedness, numbness and headaches.       Past Medical History  Diagnosis Date  . Hyperlipidemia   . Hypertension   . Diabetes mellitus     Type II  . GERD (gastroesophageal reflux disease)   . Allergy   . Syncope and collapse     1 episode  . Orthostatic hypotension 04/2006    Hospital  . Palpitations   . Acute pharyngitis   . Other abscess of vulva   . Acute sinusitis, unspecified   . Acute upper respiratory infections of unspecified site   .  Hemangioma of unspecified site   . Anal and rectal polyp   . Cataract     History of  . Osteopenia   . Osteoarthritis   . Gout   . Cavernous angioma   . Degenerative cervical disc 02/2006    History   Social History  . Marital Status: Married    Spouse Name: N/A  . Number of Children: 3  . Years of Education: N/A   Occupational History  . 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 Smoking status: Former Research scientist (life sciences)  . Smokeless tobacco: Never Used     Comment: quit 1970  . Alcohol Use: Yes     Comment: wine occassionally  . Drug Use: No  . Sexual Activity: Not on file   Other Topics Concern  . Not on file   Social History Narrative   Diet: fruit and veggies, water, lean protein.   Activity limited secondary to knee discomfort/arthritis, but going to church exersice group two times a week.    Has living will, full code. HCPOA: [name] (reviewed 2014)                Past Surgical History  Procedure Laterality Date  . Abdominal hysterectomy    . Cataract extraction    . Cavernous hemangioma  04/2004    Hospital  . Cavernous angioma    .  Aortic dopplers      Mild plaque.  EEG okay  . Total knee arthroplasty      Right    Family History  Problem Relation Age of Onset  . Cancer Maternal Aunt     Breast CA  . Heart disease Other     CHF    Allergies  Allergen Reactions  . Atorvastatin     REACTION: intolerant  . Codeine     REACTION: GI upset  . Rosuvastatin     REACTION: intolerant    Current Outpatient Prescriptions on File Prior to Visit  Medication Sig Dispense Refill  . aspirin EC 81 MG tablet Take 40.5 mg by mouth at bedtime.     . Calcium Citrate (CITRACAL PO) Take 1 capsule by mouth daily.     . colchicine 0.6 MG tablet 2 tabs po x 1 then  Repeat 1 tab in 2 hour, then got to 1 tabs BID  until flare resolved. 30 tablet 0  . fish oil-omega-3 fatty acids 1000 MG capsule Take 1 g by mouth daily.       . fluticasone (FLONASE) 50 MCG/ACT nasal spray PLACE 2 SPRAYS INTO BOTH NOSTRILS DAILY. 16 g 11  . ketoconazole (NIZORAL) 2 % cream APPLY TOPICALLY DAILY. 30 g 0  . Lancets (FREESTYLE) lancets Use to check blood sugar once daily.  Dx: E11.9 100 each 3  . metFORMIN (GLUCOPHAGE) 500 MG tablet TAKE 1 TABLET (500 MG TOTAL) BY MOUTH DAILY WITH BREAKFAST. 30 tablet 5  . Multiple Vitamin (MULTIVITAMIN) tablet Take 1 tablet by mouth daily.      [name] Glycol-Propyl Glycol (SYSTANE OP) Apply 1 drop to eye daily as needed (for dry eyes).    . ramipril (ALTACE) 5 MG capsule Take 1 capsule (5 mg total) by mouth daily. 90 capsule 3  . simvastatin (ZOCOR) 40 MG tablet Take 1 tablet (40 mg total) by mouth at bedtime. 90 tablet 1  . indomethacin (INDOCIN) 50 MG capsule Take 1 capsule (50 mg total) by mouth 3 (three) times daily with meals. (Patient not taking: Reported on 09/07/2014) 30 capsule 0  . triamcinolone cream (KENALOG) 0.5 % Apply 1 application topically daily.   0   No current facility-administered medications on file prior to visit.    BP 136/72 mmHg  Pulse 73  Temp(Src) 97.6 F (36.4 C) (Oral)  Ht 5\' 3"  (1.6 m)  Wt 133 lb (60.328 kg)  BMI 23.57 kg/m2  SpO2 97%    Objective:   Physical Exam  Constitutional: She is oriented to person, place, and time. She appears well-nourished. She does not appear ill. No distress.  Cardiovascular: Normal rate and regular rhythm.   Pulmonary/Chest: Effort normal and breath sounds normal.  Neurological: She is alert and oriented to person, place, and time.  Skin: Skin is warm and dry.  Psychiatric: She has a normal mood and affect.          Assessment & Plan:

## 2014-09-07 NOTE — Telephone Encounter (Signed)
**Patient's blood sugar continues to drop, although she is asymptomatic.  She is unsure if her glucometer is reading accurately.  Appointment scheduled with Gentry Fitz, NP today.  Patient instructed to bring home glucometer to appointment.  She will call back sooner if she becomes symptomatic.**  PLEASE NOTE: All timestamps contained within this report are represented as [place] Standard Time. CONFIDENTIALTY NOTICE: This fax transmission is intended only for the addressee. It contains information that is legally privileged, confidential or otherwise protected from use or disclosure. If you are not the intended recipient, you are strictly prohibited from reviewing, disclosing, copying using or disseminating any of this information or taking any action in reliance on or regarding this information. If you have received this fax in error, please notify us immediately by telephone so that we can arrange for its return to [place]. Phone: [phone], Toll-Free: [phone], Fax: [phone] Page: 1 of 3 Call Id: [id number] [place] Patient Name: [name] Gender: Female DOB: 16-May-1922 Age: 81 Y 73 M 27 D Return Phone Number: [phone] (Primary) Address: City/State/Zip: [place] Client [place] Night - Client Client Site [facility] [place], [place] Contact Type Call Call Type Triage / Clinical Relationship To Patient Self Return Phone Number [phone] (Primary) Chief Complaint Blood Sugar Low Initial Comment Caller states took her blood sugar this morning and was 30, had some orange juice went up over 300, now it's 79, feels very shaky and weak PreDisposition Call a family member Nurse Assessment Nurse: Malva Cogan, RN, Juliann Pulse Date/Time (Eastern Time): 09/05/2014 10:35:14 AM Confirm and document reason for call. If symptomatic, describe  symptoms. ---Caller states that she is a Type 2 diabetic & this AM her BS was 30, ate 2-3 tsps of sugar because she didn't have any orange juice but neighbor brought her some of which she drank approx 1/2 cup, took her regular Metformin dose & her BS increased to 315, drank 6-7 glasses of water over 20" period & now her BS was back down to 79 approx 15" ago & she is feeling shaky & weak. Did not eat any carbs afterwards, ate egg & ham instead. Asked caller to recheck her BS now. Has the patient traveled out of the country within the last 30 days? ---No Does the patient require triage? ---Yes Related visit to physician within the last 2 weeks? ---No Does the PT have any chronic conditions? (i.e. diabetes, asthma, etc.) ---Yes List chronic conditions. ---Type 2 DM, high cholesterol, HTN Guidelines Guideline Title Affirmed Question Affirmed Notes Nurse Date/Time (Eastern Time) Diabetes - Low Blood Sugar [1] Blood glucose < 70 mg/dl (3.9 mmol/l) or symptomatic with other adult present AND [2] cause unknown food, strenuous exercise.) Malva Cogan, RN, Juliann Pulse 09/05/2014 10:40:37 AM Disp. Time [name] Time) Disposition Final User 09/05/2014 10:54:20 AM Paged On Call back to Call [place] PLEASE NOTE: All timestamps contained within this report are represented as [place] Standard Time. CONFIDENTIALTY NOTICE: This fax transmission is intended only for the addressee. It contains information that is legally privileged, confidential or otherwise protected from use or disclosure. If you are not the intended recipient, you are strictly prohibited from reviewing, disclosing, copying using or disseminating any of this information or taking any action in reliance on or regarding this information. If you have received this fax in error, please notify us immediately by telephone so that we can arrange for its  return to [place]. Phone: [phone], Toll-Free: [phone], Fax:  [phone] Page: 2 of 3 Call Id: [id number] [place]. Time [name] Time) Disposition Final User 09/05/2014 11:14:13 AM Call Completed Malva Cogan, RN, Juliann Pulse 09/05/2014 10:51:26 AM Call PCP within 24 Hours Yes Malva Cogan, RN, Gara Kroner Understands: Yes Disagree/Comply: Comply Care Advice Given Per Guideline * IF OFFICE WILL BE CLOSED: I'll page him now.( LOW BLOOD SUGAR (HYPOGLYCEMIA) - DEFINITION: * Defined as a blood glucose under 70 mg/dl (3.9 mmol/l). * Symptoms of mild hypoglycemia - shakiness, weakness, not thinking clearly, headache, trembling, sweating, dizziness, palpitations, and hunger. * Symptoms of severe hypoglycemia - unable to speak, confusion, seizures, and coma. * Contributing factors - too much insulin, delayed meal, insufficient food, strenuous exercise, alcohol. LOW BLOOD SUGAR - TREATMENT - Eat some (15 gms) sugar NOW. Each of the following has the right amount of sugar: * Milk (1 cup; 240 ml) * Juice or soda (1/2 cup; 120 ml) * Pre-packaged juice box (1 box) * Table sugar or honey (3 teaspoons; 15 ml) * Glucose tablets (3-4 tablets) LOW BLOOD SUGAR - EXPECTED COURSE: * The symptoms should start getting better in 5 minutes. The symptoms of hypoglycemia should resolve in about 15 minutes. After the symptoms resolve, eat a small snack to prevent this from recurring. Examples include: cheese and crackers, a glass of milk, or half a sandwich. * If the symptoms of hypoglycemia are not better in 15 minutes, eat some more glucose (10 gm). DAILY RECORD: * Record the results and show them to your doctor at your next office visit. DAILY BLOOD GLUCOSE GOALS - You and your doctor should decide on what your blood glucose goals should be. Typical goals for most non-pregnant adults who perform daily finger-stick blood testing at home are: * Pre-prandial (before meal): 70-130 mg/dL (3.9-7.2 mmol/l) * Post-prandial (2-3 hours after a meal): Less than 180 mg/dL (10 mmol/l) CALL BACK IF: * There is no  improvement within 30 minutes * Sleepiness or confusion occur * You become worse. CARE ADVICE given per Diabetes - Low Blood Sugar (Adult) guideline. After Care Instructions Given Call Event Type User Date / Time Description Comments User: Olena Mater, RN Date/Time [name] Time): 09/05/2014 10:53:07 AM Caller rechecked her BS & was 143. Caller states that she is supposed to be checking her BS every 2-3 days but has not checked in past 7 days. Advised caller that she needs to check her BS every day for next few days or whenever she has any symptoms until she can reach her PCP on Monday. User: Olena Mater, RN Date/Time [name] Time): 09/05/2014 11:13:57 AM Caller advised of Dr. Virgil Benedict instructions. Paging DoctorName Phone DateTime Result/Outcome Message Type Notes Dimple Nanas [phone] 09/05/2014 10:54:20 AM Paged On Call Back to Call Center Doctor Paged Plz call Isabel @ [facility] [phone] Dimple Nanas 09/05/2014 11:10:29 AM Spoke with On Call - General Message Result Dt. Tabori returned page at 1055, advised of situation & Dr. Birdie Riddle wants caller to check her BS daily, eat regularly, hold her Metformin. If caller's BS's are consistently > 130 Metformin needs to be restarted. Also contact her PCP on Monday to PLEASE NOTE: All timestamps contained within this report are represented as [place] Standard Time. CONFIDENTIALTY NOTICE: This fax transmission is intended only for the addressee. It contains information that is legally privileged, confidential or otherwise protected from use or disclosure. If you are not the intended recipient, you are strictly prohibited from reviewing, disclosing, copying using or disseminating any of this information  or taking any action in reliance on or regarding this information. If you have received this fax in error, please notify us immediately by telephone so that we can arrange for its return to [place]. Phone: [phone], Toll-Free:  [phone], Fax: [phone] Page: 3 of 3 Call Id: [id number] Paging DoctorName Phone DateTime Result/Outcome Message Type Notes advise that her Metformin is being held secondary to low BS today but PCP needs to be called also if her BS's are consistently high or low, & check to see if caller needs to be seen prior to her next scheduled appt.

## 2014-09-08 MED ORDER — ACCU-CHEK AVIVA PLUS W/DEVICE KIT: PACK | Status: DC

## 2014-09-08 MED ORDER — GLUCOSE BLOOD VI STRP: ORAL_STRIP | Status: DC

## 2014-09-08 NOTE — Telephone Encounter (Signed)
Left message for [name] that issue with her glucose meter and test strip prescriptions have been corrected and she should be able to pick up at the pharmacy later today.

## 2014-09-08 NOTE — Telephone Encounter (Signed)
When writing Rx for glucometer/test strips and the patient has Medicare, you must write specific testing instructions as well as include a diagnosis code on the prescription.  Prescriptions resent to pharmacy with testing instructions and Dx Code.

## 2014-09-08 NOTE — Telephone Encounter (Signed)
Rollene Fare, Can you follow up on this today?  Thank you!!!

## 2014-09-10 ENCOUNTER — Telehealth: Payer: Self-pay | Admitting: Family Medicine

## 2014-09-10 NOTE — Telephone Encounter (Signed)
Noted  

## 2014-09-10 NOTE — Telephone Encounter (Signed)
Patient Name: [name] DOB: March 14, 1923 Initial Comment Caller states she was in office earlier this week for blood sugar, got new meter, has been low, 102/103 at waking, wants to know if all right to not take metformin Nurse Assessment Nurse: Ronnald Ramp, RN, [name] Date/Time (Eastern Time): 09/10/2014 10:58:52 AM Confirm and document reason for call. If symptomatic, describe symptoms. ---Caller states she is wanting to know if it is ok to not take the Metformin. She states he BS has been lower than normal and was seen by the NP this week. Has the patient traveled out of the country within the last 30 days? ---Not Applicable Does the patient require triage? ---No Please document clinical information provided and list any resource used. ---I reviewed the notes from the visit with Alma Friendly NP and it said to resume her Metformin and to call if BS is < 60 or showed an error on the meter. Told caller she needs to follow the NP's advice and continue with her medication. Guidelines Guideline Title Affirmed Question Affirmed Notes Final Disposition User Clinical Call Ronnald Ramp, RN, [name] Comments After disconnecting and opening chart to put record in [place], found another telephone note in [place]. The information states, "Pt called in stating that her blood sugar was low this morning. It was in the 60's. She took her metformin this morning. She has since checked it and it was 102. She drank orange juice and it wen high. Pt is requesting a call back at the home number. Thanks" After reading this, I called the patient back to verify. She states that she did not have a BS of 60 today and that is not what she told the person. She was trying to explain the entire situation again. Her BS today was 102. So, reinforced need to continue with medication and call if low reading. Also called and spoke with Melissa at the office to update her on the situation.

## 2014-09-10 NOTE — Telephone Encounter (Signed)
Pt called in stating that her blood sugar was low this morning.  It was in the 60's.  She took her metformin this morning.  She has since checked it and it was 102.  She drank orange juice and it wen high. Pt is requesting a call back at the home number. Thanks.

## 2014-10-06 ENCOUNTER — Telehealth: Payer: Self-pay | Admitting: Family Medicine

## 2014-10-06 DIAGNOSIS — E119 Type 2 diabetes mellitus without complications: Secondary | ICD-10-CM

## 2014-10-06 NOTE — Telephone Encounter (Signed)
-----   Message from Ellamae Sia sent at 09/28/2014  3:10 PM EDT ----- Regarding: Lab orders for Wednesday,6.15.16 Lab orders for a f/u

## 2014-10-08 ENCOUNTER — Other Ambulatory Visit (INDEPENDENT_AMBULATORY_CARE_PROVIDER_SITE_OTHER): Payer: Medicare Other

## 2014-10-08 DIAGNOSIS — E119 Type 2 diabetes mellitus without complications: Secondary | ICD-10-CM

## 2014-10-08 LAB — HEMOGLOBIN A1C: Hgb A1c MFr Bld: 6 % (ref 4.6–6.5)

## 2014-10-13 ENCOUNTER — Ambulatory Visit (INDEPENDENT_AMBULATORY_CARE_PROVIDER_SITE_OTHER): Payer: Medicare Other | Admitting: Family Medicine

## 2014-10-13 ENCOUNTER — Encounter: Payer: Self-pay | Admitting: Family Medicine

## 2014-10-13 ENCOUNTER — Ambulatory Visit: Payer: PRIVATE HEALTH INSURANCE | Admitting: Family Medicine

## 2014-10-13 VITALS — BP 102/58 | HR 74 | Temp 97.4°F | Ht 63.0 in | Wt 133.0 lb

## 2014-10-13 DIAGNOSIS — E119 Type 2 diabetes mellitus without complications: Secondary | ICD-10-CM | POA: Diagnosis not present

## 2014-10-13 DIAGNOSIS — I10 Essential (primary) hypertension: Secondary | ICD-10-CM

## 2014-10-13 DIAGNOSIS — R21 Rash and other nonspecific skin eruption: Secondary | ICD-10-CM | POA: Diagnosis not present

## 2014-10-13 LAB — HM DIABETES FOOT EXAM

## 2014-10-13 MED ORDER — RAMIPRIL 2.5 MG PO CAPS: 2.5000 mg | ORAL_CAPSULE | Freq: Every day | ORAL | Status: DC

## 2014-10-13 NOTE — Progress Notes (Signed)
Subjective:    Patient ID: [name], female    DOB: 1922/06/19, 81 y.o.   MRN: [id number]  HPI   81 year old female  Presents for 3 months follow up.  She was seen on 5/16 by Allie Bossier NP for hypoglycemia down to 30. Concern at that appt  For glucometer malfunction as pt had no hypoglycemia symptoms.  She got a new meter and she has had no lows since.  She has DM, treated with metformin 500 mg daily.   Lab Results  Component Value Date   HGBA1C 6.0 10/08/2014  Using medications without difficulties: none Hypoglycemic episodes: None Hyperglycemic episodes:None Feet problems:None Blood Sugars averaging: 89-105, 2 hour pp 84-159 eye exam within last year: yes   She has cut back on rice, pasta, grits. Wt Readings from Last 3 Encounters:  10/13/14 133 lb (60.328 kg)  09/07/14 133 lb (60.328 kg)  08/26/14 135 lb (61.236 kg)     Hypertension:  On ramipril for blood pressure BP Readings from Last 3 Encounters:  10/13/14 102/58  09/07/14 136/72  08/26/14 134/83  Using medication without problems or lightheadedness:  Today she feels slightly lightheaded, occ blurred vision Chest pain with exertion: none Edema:None Short of breath:None Average home BPs: not checking Other issues:  No recent falls. Using cane.   She has had no improvement in rash on face with triamcinolone for 2 weeks or with treatment for her seborrheic dermatitis with ketoconazole.  Social History /Family History/Past Medical History reviewed and updated if needed.   Review of Systems  Constitutional: Negative for fever and fatigue.  HENT: Negative for ear pain.   Eyes: Negative for pain.  Respiratory: Negative for chest tightness and shortness of breath.   Cardiovascular: Negative for chest pain, palpitations and leg swelling.  Gastrointestinal: Negative for abdominal pain.  Genitourinary: Negative for dysuria.  Neurological: Positive for light-headedness.       Objective:   Physical  Exam  Constitutional: She is oriented to person, place, and time. Vital signs are normal. She appears well-developed and well-nourished. She is cooperative.  Non-toxic appearance. She does not appear ill. No distress.  Elderly female in NAD  HENT:  Head: Normocephalic.  Right Ear: Hearing, tympanic membrane, external ear and ear canal normal. Tympanic membrane is not erythematous, not retracted and not bulging.  Left Ear: Hearing, tympanic membrane, external ear and ear canal normal. Tympanic membrane is not erythematous, not retracted and not bulging.  Nose: No mucosal edema or rhinorrhea. Right sinus exhibits no maxillary sinus tenderness and no frontal sinus tenderness. Left sinus exhibits no maxillary sinus tenderness and no frontal sinus tenderness.  Mouth/Throat: Uvula is midline, oropharynx is clear and moist and mucous membranes are normal.  Eyes: Conjunctivae, EOM and lids are normal. Pupils are equal, round, and reactive to light. Lids are everted and swept, no foreign bodies found.  Neck: Trachea normal and normal range of motion. Neck supple. Carotid bruit is not present. No thyroid mass and no thyromegaly present.  Cardiovascular: Normal rate, regular rhythm, S1 normal, S2 normal, normal heart sounds, intact distal pulses and normal pulses.  Exam reveals no gallop and no friction rub.   No murmur heard. Pulmonary/Chest: Effort normal and breath sounds normal. No tachypnea. No respiratory distress. She has no decreased breath sounds. She has no wheezes. She has no rhonchi. She has no rales.  Abdominal: Soft. Normal appearance and bowel sounds are normal. There is no tenderness.  Neurological: She is alert and  oriented to person, place, and time.  Skin: Skin is warm, dry and intact. Rash noted.  Dry flaky erythematous macules on face  Psychiatric: Her speech is normal and behavior is normal. Judgment and thought content normal. Her mood appears not anxious. Cognition and memory are  normal. She does not exhibit a depressed mood.          Assessment & Plan:

## 2014-10-13 NOTE — Progress Notes (Signed)
Pre visit review using our clinic review tool, if applicable. No additional management support is needed unless otherwise documented below in the visit note. 

## 2014-10-13 NOTE — Assessment & Plan Note (Signed)
She has had no improvement in rash on face with triamcinolone for 2 weeks or with treatment for her seborrheic dermatitis with ketoconazole. Refer to Derm for further eval.

## 2014-10-13 NOTE — Patient Instructions (Addendum)
Set up appt with [facility] for eval of right shoulder pain if worsening. Stop at front desk for referral to [facility]. Increase water intake. Decrease ramipril to 2.5 mg daily. Follow BP at home daily for 1-2 weeks. Call with measurements.

## 2014-10-13 NOTE — Assessment & Plan Note (Signed)
Low blood pressure today with slight lightheadedness ( she did not notice until found her BP was low).  Lower ramipril to 2.5 mg daily.. Follow BPs at home.

## 2014-10-13 NOTE — Assessment & Plan Note (Signed)
Excellent control on diabetes. No further true lows on new glucometer.

## 2014-10-16 ENCOUNTER — Telehealth: Payer: Self-pay | Admitting: Family Medicine

## 2014-10-16 NOTE — Telephone Encounter (Signed)
Pt took her bp this am 154/over something. Then she recently took and it went to 140.  She took it while she was on the phone and it was 172/92.  At first, she did not know there was a second number.  Pt requests cb at [phone].

## 2014-10-16 NOTE — Telephone Encounter (Signed)
Left message for patient to return call.

## 2014-10-16 NOTE — Telephone Encounter (Signed)
Patient's blood pressure was 102/58 at OV yesterday.  Per patient, BP medication was decreased at that time.  Today her BP was 154/? (patient does not remember diastolic) and 379/43.  I had patient check pressure again while I was on the phone: 169/89 HR 95.  Patient denies any symptoms.  Please advise.

## 2014-10-16 NOTE — Telephone Encounter (Signed)
Patient notified and medication list updated.

## 2014-10-16 NOTE — Telephone Encounter (Signed)
Have pt return to previous ramipril dose at 5 mg daily. Change on med list. Call with update of BP if remains > 140/90

## 2014-10-19 ENCOUNTER — Telehealth: Payer: Self-pay | Admitting: Family Medicine

## 2014-10-19 ENCOUNTER — Telehealth: Payer: Self-pay

## 2014-10-19 NOTE — Telephone Encounter (Signed)
PLEASE NOTE: All timestamps contained within this report are represented as [place] Standard Time. CONFIDENTIALTY NOTICE: This fax transmission is intended only for the addressee. It contains information that is legally privileged, confidential or otherwise protected from use or disclosure. If you are not the intended recipient, you are strictly prohibited from reviewing, disclosing, copying using or disseminating any of this information or taking any action in reliance on or regarding this information. If you have received this fax in error, please notify us immediately by telephone so that we can arrange for its return to [place]. Phone: [phone], Toll-Free: [phone], Fax: [phone] Page: 1 of 2 Call Id: [id number] [place] Patient Name: [name] Gender: Female DOB: 1923-04-13 Age: 81 Y 66 M 8 D Return Phone Number: [phone] (Primary) Address: City/State/Zip: [place] Client [place] Night - Client Client Site [place] [facility] [place], [place] Contact Type Call Call Type Triage / Clinical Relationship To Patient Self Return Phone Number [phone] (Primary) Chief Complaint Blood Pressure High Initial Comment Caller states her blood pressure is 167/86. PreDisposition Did not know what to do Nurse Assessment Nurse: Jimmye Norman, RN, Whitney Date/Time (Eastern Time): 10/17/2014 1:01:03 PM Confirm and document reason for call. If symptomatic, describe symptoms. ---Caller states her blood pressure is 167/86, was instructed to call if the top number was above 140. states she was seen in the office on tuesday and her blood pressure was 98, was taking 5 mg of verapril and they changed it to 2.5 mg. Has the patient traveled out of the country within the last 30 days? ---Not Applicable Does the patient require triage? ---Yes Related  visit to physician within the last 2 weeks? ---Yes Does the PT have any chronic conditions? (i.e. diabetes, asthma, etc.) ---Yes List chronic conditions. ---hypertension and diabetes Guidelines Guideline Title Affirmed Question Affirmed Notes Nurse Date/Time (Eastern Time) High Blood Pressure BP # 160/100 Jimmye Norman, RN, Whitney 10/17/2014 1:03:36 PM Disp. Time [name] Time) Disposition Final User 10/17/2014 1:10:36 PM See PCP When Office is Open (within 3 days) Yes Jimmye Norman, RN, Loree Fee Caller Understands: Yes Disagree/Comply: Comply Care Advice Given Per Guideline SEE PCP WITHIN 3 DAYS: You need to be examined within 2 or 3 days. Call your doctor during regular office hours and make an appointment. (Note: if office will be open tomorrow, tell caller to call then, not in 3 days). CALL BACK IF: * Your blood pressure PLEASE NOTE: All timestamps contained within this report are represented as [place] Standard Time. CONFIDENTIALTY NOTICE: This fax transmission is intended only for the addressee. It contains information that is legally privileged, confidential or otherwise protected from use or disclosure. If you are not the intended recipient, you are strictly prohibited from reviewing, disclosing, copying using or disseminating any of this information or taking any action in reliance on or regarding this information. If you have received this fax in error, please notify us immediately by telephone so that we can arrange for its return to [place]. Phone: [phone], Toll-Free: [phone], Fax: [phone] Page: 2 of 2 Call Id: [id number] Care Advice Given Per Guideline is over 180/110 * Chest pain or difficulty breathing occurs * Difficulty walking, difficulty talking, or severe headache occurs * Weakness or numbness of the face, arm or leg on one side of the body occurs * You become worse. After Care Instructions Given Call Event Type User Date / Time Description

## 2014-10-19 NOTE — Telephone Encounter (Signed)
Pt called with bp readings  6/22  132  Pt didn't get bottom number 6/23   136 pt didn't get bottom number 6/24   154 pt didn't get bottom number 6/25   167/86 6/26   160/81 6/27   171/91   Pt wanted to know what she needs to do

## 2014-10-19 NOTE — Telephone Encounter (Signed)
Pt had next door neighbor take her bp this morning after the 171/91 reading, It was 131/74 pulse 75

## 2014-10-19 NOTE — Telephone Encounter (Signed)
Patient is asking for Butch Penny to call her back about her blood pressure. Please call patient back at  [phone].

## 2014-10-20 NOTE — Telephone Encounter (Signed)
Patient returned [name]'s call.  Please call patient back at her home number.

## 2014-10-20 NOTE — Telephone Encounter (Addendum)
[name] notified as instructed by telephone.  She states she increased her ramipril back to 5 mg on Sunday.  Today her BP was 122/73.  I recommended that she continue to check her blood pressures at home twice a day to make sure her numbers don't drop too low being back on the 5 mg and to call be back on Friday with those numbers.  Advised if she is consistently running >140/90 or <100/60 to call me back before Friday. Patient states understanding.

## 2014-10-20 NOTE — Telephone Encounter (Signed)
Have pt return to previous dose of medication ( 5 mg ramipril) if she has not already. Change in med list

## 2014-10-20 NOTE — Telephone Encounter (Signed)
Left message for [name] to return my call.

## 2014-10-22 ENCOUNTER — Telehealth: Payer: Self-pay

## 2014-10-22 ENCOUNTER — Telehealth: Payer: Self-pay | Admitting: Family Medicine

## 2014-10-22 NOTE — Telephone Encounter (Signed)
PLEASE NOTE: All timestamps contained within this report are represented as [place] Standard Time. CONFIDENTIALTY NOTICE: This fax transmission is intended only for the addressee. It contains information that is legally privileged, confidential or otherwise protected from use or disclosure. If you are not the intended recipient, you are strictly prohibited from reviewing, disclosing, copying using or disseminating any of this information or taking any action in reliance on or regarding this information. If you have received this fax in error, please notify us immediately by telephone so that we can arrange for its return to [place]. Phone: [phone], Toll-Free: [phone], Fax: [phone] Page: 1 of 2 Call Id: [id number] [place] Patient Name: [name] Gender: Female DOB: 10-28-1922 Age: 81 Y 27 M 12 D Return Phone Number: [phone] (Primary) Address: City/State/Zip: [place] Client [place] Night - Client Client Site [place] Physician [place], [place] Contact Type Call Call Type Triage / Clinical Relationship To Patient Self Return Phone Number [phone] (Primary) Chief Complaint Blood Pressure High Initial Comment Caller states that her systolic blood pressure was low at 149 at 2:15, and now it is 167 in just 3 hours. Feeling lightheaded. Caller states that she took bp meds this morning. PreDisposition Call a family member Nurse Assessment Nurse: Donovan Kail, RN, Barnetta Chapel Date/Time [name] Time): 10/21/2014 6:53:49 PM Confirm and document reason for call. If symptomatic, describe symptoms. ---Caller states that her systolic blood pressure was low at 149 at 2:15, and now it is 167 in just 3 hours. Feeling lightheaded. Caller states that she took bp meds this morning. Right now she is feeling a little bit lightheaded. She went to  the office 2 weeks ago and her BP was low. Has the patient traveled out of the country within the last 30 days? ---Not Applicable Does the patient require triage? ---Yes Related visit to physician within the last 2 weeks? ---Yes Does the PT have any chronic conditions? (i.e. diabetes, asthma, etc.) ---Yes List chronic conditions. ---HTN, diabetes, Guidelines Guideline Title Affirmed Question Affirmed Notes Nurse Date/Time (Eastern Time) High Blood Pressure BP # 160/100 Idamae Lusher 10/21/2014 6:57:52 PM Disp. Time [name] Time) Disposition Final User 10/21/2014 7:02:28 PM See PCP When Office is Open (within 3 days) Yes Donovan Kail, RN, Ledell Noss Understands: Yes Disagree/Comply: Comply PLEASE NOTE: All timestamps contained within this report are represented as [place] Standard Time. CONFIDENTIALTY NOTICE: This fax transmission is intended only for the addressee. It contains information that is legally privileged, confidential or otherwise protected from use or disclosure. If you are not the intended recipient, you are strictly prohibited from reviewing, disclosing, copying using or disseminating any of this information or taking any action in reliance on or regarding this information. If you have received this fax in error, please notify us immediately by telephone so that we can arrange for its return to [place]. Phone: [phone], Toll-Free: [phone], Fax: [phone] Page: 2 of 2 Call Id: [id number] Care Advice Given Per Guideline SEE PCP WITHIN 3 DAYS: You need to be examined within 2 or 3 days. Call your doctor during regular office hours and make an appointment. (Note: if office will be open tomorrow, tell caller to call then, not in 3 days). HIGH BLOOD PRESSURE: * Untreated high blood pressure may cause damage to your heart, brain, kidneys, and eyes. * Treatment of high blood pressure can reduce the risk of stroke, heart attack, and heart  failure. * The goal of  blood pressure treatment for most patients with hypertension is to keep the blood pressure under 140/90. LIFESTYLE MODIFICATIONS - The following things can help you reduce your blood pressure: * EAT HEALTHY: Eat a diet rich in fresh fruits and vegetables, dietary fiber, non-animal protein (e.g., soy), and low-fat dairy products. Avoid foods with a high content of saturated fat or cholesterol. * DECREASE SODIUM INTAKE: Aim to eat less than 1,500 mg of sodium each day. Unfortunately 75% of the salt in the average person's diet is in pre-processed foods. * LIMIT ALCOHOL: Limit alcohol to 0-2 standard drinks each day. Men should have less than 14 dinks per week. Women should have less than 9 drinks per week. A drink is 1.5 oz hard liquor (one shot or jigger; 45 ml), 5 oz wine (small glass; 150 ml), 12 oz beer (one can; 360 ml). * EXERCISE, BE MORE PHYSICALLY ACTIVE: Do 30-60 minutes of moderate intensity exercise four to seven times a week. Examples include aerobic activities like brisk walking, cycling, and swimming. * REDUCE WEIGHT AND WAIST LINE: It is important to maintain a normal body weight. The goal should be a BMI (body mass index) under 25 for men and women, a waist circumference under 40 inches (102 cm) in men, and a waist circumference under 35 inches (88 cm) in women. * REDUCE STRESS: Find activities that help reduce your stress. Examples might include meditation, yoga, or even a restful walk in a park. CALL BACK IF: * Weakness or numbness of the face, arm or leg on one side of the body occurs * Difficulty walking, difficulty talking, or severe headache occurs * Chest pain or difficulty breathing occurs * Your blood pressure is over 180/110 * You become worse. CARE ADVICE given per High Blood Pressure (Adult) guideline. After Care Instructions Given Call Event Type User Date / Time Description

## 2014-10-22 NOTE — Telephone Encounter (Signed)
Patient Name: [name] DOB: January 05, 1923 Initial Comment Caller states, blood pressure questions , she is getting varying results, wants to know exactly how to take it. Nurse Assessment Nurse: Marcelline Deist, RN, Lynda Date/Time (Eastern Time): 10/22/2014 9:59:39 AM Confirm and document reason for call. If symptomatic, describe symptoms. ---Caller states she has blood pressure questions. She is getting varying results, wants to know exactly how to take it. She was recently lowered on her BP rx. Ramapril, then told to go back on 5 mg once a day. Spoke with Dr's nurse., told to call if < 100/60. Her neighbor got 131/86, pt. got 118/? Felt a little dizzy last night. Felt her HR beating faster than normal when she went out to get paper. Has the patient traveled out of the country within the last 30 days? ---Not Applicable Does the patient require triage? ---Yes Related visit to physician within the last 2 weeks? ---Yes Does the PT have any chronic conditions? (i.e. diabetes, asthma, etc.) ---Yes List chronic conditions. ---high BP, on rx, diabetes, on Metformin Guidelines Guideline Title Affirmed Question Affirmed Notes High Blood Pressure [1] Taking BP medications AND [2] feels is having side effects (e.g., impotence, cough, dizzy upon standing) Final Disposition User See PCP When Office is Open (within 3 days) Marcelline Deist, RN, [employer] is very anxious about her BP readings. Seems that her BP is within normal range when her neighbor takes it, but higher when she takes it. Advised that if she develops symptoms, or anything worsens, to call back, or be seen sooner in an UC.

## 2014-10-22 NOTE — Telephone Encounter (Signed)
Pt has appt on 10/23/14 at 8:45 am to see Dr Diona Browner.

## 2014-10-22 NOTE — Telephone Encounter (Signed)
Please call pt to find out how she is doing. She should be back on her regular BP med dose now.

## 2014-10-22 NOTE — Telephone Encounter (Signed)
[name] is scheduled to see Dr. Diona Browner 10/23/2014 at 8:45 am to discuss BP concerns.

## 2014-10-23 ENCOUNTER — Encounter: Payer: Self-pay | Admitting: Family Medicine

## 2014-10-23 ENCOUNTER — Ambulatory Visit: Payer: Self-pay | Admitting: Family Medicine

## 2014-10-23 ENCOUNTER — Ambulatory Visit (INDEPENDENT_AMBULATORY_CARE_PROVIDER_SITE_OTHER): Payer: Medicare Other | Admitting: Family Medicine

## 2014-10-23 VITALS — BP 132/83 | HR 83 | Temp 97.6°F | Ht 63.0 in | Wt 130.5 lb

## 2014-10-23 DIAGNOSIS — K219 Gastro-esophageal reflux disease without esophagitis: Secondary | ICD-10-CM | POA: Diagnosis not present

## 2014-10-23 DIAGNOSIS — I10 Essential (primary) hypertension: Secondary | ICD-10-CM | POA: Diagnosis not present

## 2014-10-23 DIAGNOSIS — M25511 Pain in right shoulder: Secondary | ICD-10-CM | POA: Diagnosis not present

## 2014-10-23 NOTE — Patient Instructions (Signed)
Stop  Checking blood pressure daily, can check it once a week.  Call if shoulder pain is worsening. Can use tylenol for shoulder pain.  Start  20 mg omeprazole for reflux/indigestion x 2-4 weeks. Avoid acidic foods as discussed.

## 2014-10-23 NOTE — Progress Notes (Signed)
Subjective:    Patient ID: [name], female    DOB: 26-Jun-1922, 81 y.o.   MRN: [id number]  HPI   81 year old female with history of  DM, HTN, high cholesterol, palpitations,and frequent falls presents with recently fluctuating blood pressures.  At last OV her BP was low. Her enalapril was decreased to 2.5 mg daily. Few days later she noted elevated BP as documented in the chart. 167/86 After her phone call we restarted enalapril at 5 mg daily.  BP Readings from Last 3 Encounters:  10/23/14 132/77  10/13/14 102/58  09/07/14 136/72    Some fatgiue, she is anxious about BP. Keeping her up at night. No new neuro changes, no chest pain, no SOB.  She is anxious about family health.   Pain in right shoulder off and on in left shoulder. Pain moving arm above head, decreased ROM with  Int and ext rotation, no pain.  Does not bother her enough to   Occ notes left arm numb when waking up in AM.  Changes with movement and shaking out hand  She has indigestion and burping few times a day. Not on PPI.    Review of Systems  Constitutional: Negative for fever and fatigue.  HENT: Negative for ear pain.   Eyes: Negative for pain.  Respiratory: Negative for shortness of breath.   Cardiovascular: Negative for chest pain.       Objective:   Physical Exam  Constitutional: Vital signs are normal. She appears well-developed and well-nourished. She is cooperative.  Non-toxic appearance. She does not appear ill. No distress.  HENT:  Head: Normocephalic.  Right Ear: Hearing, tympanic membrane, external ear and ear canal normal. Tympanic membrane is not erythematous, not retracted and not bulging.  Left Ear: Hearing, tympanic membrane, external ear and ear canal normal. Tympanic membrane is not erythematous, not retracted and not bulging.  Nose: No mucosal edema or rhinorrhea. Right sinus exhibits no maxillary sinus tenderness and no frontal sinus tenderness. Left sinus exhibits no  maxillary sinus tenderness and no frontal sinus tenderness.  Mouth/Throat: Uvula is midline, oropharynx is clear and moist and mucous membranes are normal.  Eyes: Conjunctivae, EOM and lids are normal. Pupils are equal, round, and reactive to light. Lids are everted and swept, no foreign bodies found.  Neck: Trachea normal and normal range of motion. Neck supple. Carotid bruit is not present. No thyroid mass and no thyromegaly present.  Cardiovascular: Normal rate, regular rhythm, S1 normal, S2 normal, normal heart sounds, intact distal pulses and normal pulses.  Exam reveals no gallop and no friction rub.   No murmur heard. Pulmonary/Chest: Effort normal and breath sounds normal. No tachypnea. No respiratory distress. She has no decreased breath sounds. She has no wheezes. She has no rhonchi. She has no rales.  Abdominal: Soft. Normal appearance and bowel sounds are normal. There is no tenderness. There is no guarding and no CVA tenderness.  Musculoskeletal:       Right shoulder: She exhibits decreased range of motion. She exhibits no tenderness and no bony tenderness.  Pain only with int and ext rotaation , abduction , none on palpation.  Neurological: She is alert.  Skin: Skin is warm, dry and intact. No rash noted.  Psychiatric: Her speech is normal and behavior is normal. Judgment and thought content normal. Her mood appears not anxious. Cognition and memory are normal. She does not exhibit a depressed mood.          Assessment &  Plan:

## 2014-10-23 NOTE — Assessment & Plan Note (Signed)
Fluctuations likely caused by pt anxiety. Well controlled at this time. Home cuff appears accurate. Decrease checking, continue current med.

## 2014-10-23 NOTE — Assessment & Plan Note (Signed)
Restart omeprazole for 2-4 week, wean off.  Trigger avoidance.

## 2014-10-23 NOTE — Progress Notes (Signed)
Pre visit review using our clinic review tool, if applicable. No additional management support is needed unless otherwise documented below in the visit note. 

## 2014-10-23 NOTE — Assessment & Plan Note (Signed)
Tylenol for pain. Pt refuses further eval as not bothering that much, if worsens consider NSAID, X-ray etc.

## 2014-11-12 DIAGNOSIS — L570 Actinic keratosis: Secondary | ICD-10-CM | POA: Diagnosis not present

## 2014-12-12 ENCOUNTER — Other Ambulatory Visit: Payer: Self-pay | Admitting: Family Medicine

## 2014-12-23 ENCOUNTER — Ambulatory Visit (INDEPENDENT_AMBULATORY_CARE_PROVIDER_SITE_OTHER): Payer: Medicare Other | Admitting: Podiatry

## 2014-12-23 DIAGNOSIS — B351 Tinea unguium: Secondary | ICD-10-CM | POA: Diagnosis not present

## 2014-12-23 DIAGNOSIS — M79676 Pain in unspecified toe(s): Secondary | ICD-10-CM | POA: Diagnosis not present

## 2014-12-23 NOTE — Patient Instructions (Signed)
Diabetes and Foot Care Diabetes may cause you to have problems because of poor blood supply (circulation) to your feet and legs. This may cause the skin on your feet to become thinner, break easier, and heal more slowly. Your skin may become dry, and the skin may peel and crack. You may also have nerve damage in your legs and feet causing decreased feeling in them. You may not notice minor injuries to your feet that could lead to infections or more serious problems. Taking care of your feet is one of the most important things you can do for yourself.  HOME CARE INSTRUCTIONS  Wear shoes at all times, even in the house. Do not go barefoot. Bare feet are easily injured.  Check your feet daily for blisters, cuts, and redness. If you cannot see the bottom of your feet, use a mirror or ask someone for help.  Wash your feet with warm water (do not use hot water) and mild soap. Then pat your feet and the areas between your toes until they are completely dry. Do not soak your feet as this can dry your skin.  Apply a moisturizing lotion or petroleum jelly (that does not contain alcohol and is unscented) to the skin on your feet and to dry, brittle toenails. Do not apply lotion between your toes.  Trim your toenails straight across. Do not dig under them or around the cuticle. File the edges of your nails with an emery board or nail file.  Do not cut corns or calluses or try to remove them with medicine.  Wear clean socks or stockings every day. Make sure they are not too tight. Do not wear knee-high stockings since they may decrease blood flow to your legs.  Wear shoes that fit properly and have enough cushioning. To break in new shoes, wear them for just a few hours a day. This prevents you from injuring your feet. Always look in your shoes before you put them on to be sure there are no objects inside.  Do not cross your legs. This may decrease the blood flow to your feet.  If you find a minor scrape,  cut, or break in the skin on your feet, keep it and the skin around it clean and dry. These areas may be cleansed with mild soap and water. Do not cleanse the area with peroxide, alcohol, or iodine.  When you remove an adhesive bandage, be sure not to damage the skin around it.  If you have a wound, look at it several times a day to make sure it is healing.  Do not use heating pads or hot water bottles. They may burn your skin. If you have lost feeling in your feet or legs, you may not know it is happening until it is too late.  Make sure your health care provider performs a complete foot exam at least annually or more often if you have foot problems. Report any cuts, sores, or bruises to your health care provider immediately. SEEK MEDICAL CARE IF:   You have an injury that is not healing.  You have cuts or breaks in the skin.  You have an ingrown nail.  You notice redness on your legs or feet.  You feel burning or tingling in your legs or feet.  You have pain or cramps in your legs and feet.  Your legs or feet are numb.  Your feet always feel cold. SEEK IMMEDIATE MEDICAL CARE IF:   There is increasing redness,   swelling, or pain in or around a wound.  There is a red line that goes up your leg.  Pus is coming from a wound.  You develop a fever or as directed by your health care provider.  You notice a bad smell coming from an ulcer or wound. Document Released: 04/07/2000 Document Revised: 12/11/2012 Document Reviewed: 09/17/2012 ExitCare Patient Information 2015 [employer]. This information is not intended to replace advice given to you by your health care provider. Make sure you discuss any questions you have with your health care provider.  

## 2014-12-24 NOTE — Progress Notes (Signed)
Patient ID: [name], female   DOB: 28-Sep-1922, 81 y.o.   MRN: [id number]  Subjective: This patient presents today requesting debridement of painful toenails when walking wearing shoes. She was last seen for a similar service on 05/04/2014 and at that time return in ulcerative recommended at three-month intervals for nail debridement  Objective: Orientated 3 The toenails are extremely elongated, brittle, hypertrophic, discolored and tender to direct palpation 6-10  Assessment: Type II diabetic without complications Neglected symptomatic onychomycoses 6-10  Plan: Debridement toenails 10 and mechanically and electrically without any bleeding or complications.. Patient advised to return at three-month intervals for nail debridement

## 2015-01-12 ENCOUNTER — Telehealth: Payer: Self-pay | Admitting: Family Medicine

## 2015-01-12 ENCOUNTER — Other Ambulatory Visit (INDEPENDENT_AMBULATORY_CARE_PROVIDER_SITE_OTHER): Payer: Medicare Other

## 2015-01-12 DIAGNOSIS — E119 Type 2 diabetes mellitus without complications: Secondary | ICD-10-CM

## 2015-01-12 DIAGNOSIS — M1A9XX0 Chronic gout, unspecified, without tophus (tophi): Secondary | ICD-10-CM

## 2015-01-12 DIAGNOSIS — M810 Age-related osteoporosis without current pathological fracture: Secondary | ICD-10-CM

## 2015-01-12 LAB — COMPREHENSIVE METABOLIC PANEL
ALBUMIN: 3.8 g/dL (ref 3.5–5.2)
ALK PHOS: 43 U/L (ref 39–117)
ALT: 13 U/L (ref 0–35)
AST: 17 U/L (ref 0–37)
BUN: 28 mg/dL — ABNORMAL HIGH (ref 6–23)
CALCIUM: 9.7 mg/dL (ref 8.4–10.5)
CO2: 28 mEq/L (ref 19–32)
Chloride: 107 mEq/L (ref 96–112)
Creatinine, Ser: 1.14 mg/dL (ref 0.40–1.20)
GFR: 47.59 mL/min — ABNORMAL LOW (ref >60.00)
Glucose, Bld: 97 mg/dL (ref 70–99)
POTASSIUM: 4.5 meq/L (ref 3.5–5.1)
SODIUM: 141 meq/L (ref 135–145)
TOTAL PROTEIN: 7.1 g/dL (ref 6.0–8.3)
Total Bilirubin: 0.5 mg/dL (ref 0.2–1.2)

## 2015-01-12 LAB — LIPID PANEL
CHOLESTEROL: 138 mg/dL (ref 0–200)
HDL: 56.2 mg/dL (ref >39.00)
LDL Cholesterol: 69 mg/dL (ref 0–99)
NonHDL: 82.04
Total CHOL/HDL Ratio: 2
Triglycerides: 66 mg/dL (ref 0.0–149.0)
VLDL: 13.2 mg/dL (ref 0.0–40.0)

## 2015-01-12 LAB — URIC ACID: Uric Acid, Serum: 6 mg/dL (ref 2.4–7.0)

## 2015-01-12 LAB — VITAMIN D 25 HYDROXY (VIT D DEFICIENCY, FRACTURES): VITD: 36.55 ng/mL (ref 30.00–100.00)

## 2015-01-12 LAB — HEMOGLOBIN A1C: HEMOGLOBIN A1C: 6 % (ref 4.6–6.5)

## 2015-01-12 NOTE — Telephone Encounter (Signed)
-----   Message from Ellamae Sia sent at 01/07/2015 10:49 AM EDT ----- Regarding: Lab orders for Tuesday, 9.20.16 Patient is scheduled for CPX labs, please order future labs, Thanks , Karna Christmas

## 2015-01-15 ENCOUNTER — Encounter: Payer: Self-pay | Admitting: Family Medicine

## 2015-01-15 ENCOUNTER — Ambulatory Visit (INDEPENDENT_AMBULATORY_CARE_PROVIDER_SITE_OTHER): Payer: Medicare Other | Admitting: Family Medicine

## 2015-01-15 VITALS — BP 142/81 | HR 71 | Temp 97.7°F | Ht 63.0 in | Wt 132.8 lb

## 2015-01-15 DIAGNOSIS — E119 Type 2 diabetes mellitus without complications: Secondary | ICD-10-CM | POA: Diagnosis not present

## 2015-01-15 DIAGNOSIS — Z0000 Encounter for general adult medical examination without abnormal findings: Secondary | ICD-10-CM | POA: Diagnosis not present

## 2015-01-15 DIAGNOSIS — M810 Age-related osteoporosis without current pathological fracture: Secondary | ICD-10-CM

## 2015-01-15 DIAGNOSIS — E785 Hyperlipidemia, unspecified: Secondary | ICD-10-CM | POA: Diagnosis not present

## 2015-01-15 DIAGNOSIS — Z7189 Other specified counseling: Secondary | ICD-10-CM

## 2015-01-15 DIAGNOSIS — M1A9XX0 Chronic gout, unspecified, without tophus (tophi): Secondary | ICD-10-CM

## 2015-01-15 LAB — HM DIABETES FOOT EXAM

## 2015-01-15 NOTE — Patient Instructions (Addendum)
Stop at the front desk for bone density referral.  Keep up the great work on healthy eating.  Call if have any low blood sugars < 60.  Have a great party!

## 2015-01-15 NOTE — Assessment & Plan Note (Signed)
Well controlled. Continue current medication.  

## 2015-01-15 NOTE — Assessment & Plan Note (Signed)
Due for bone density off fosamax in last 2 years.

## 2015-01-15 NOTE — Progress Notes (Signed)
Pre visit review using our clinic review tool, if applicable. No additional management support is needed unless otherwise documented below in the visit note. 

## 2015-01-15 NOTE — Progress Notes (Signed)
I have personally reviewed the Medicare Annual Wellness questionnaire and have noted 1. The patient's medical and social history 2. Their use of alcohol, tobacco or illicit drugs 3. Their current medications and supplements 4. The patient's functional ability including ADL's, fall risks, home safety risks and hearing or visual             impairment. 5. Diet and physical activities 6. Evidence for depression or mood disorders 7.         Updated Murna Backer list Cognitive evaluation was performed and recorded on pt medicare questionnaire form. The patients weight, height, BMI and visual acuity have been recorded in the chart  I have made referrals, counseling and provided education to the patient based review of the above and I have provided the pt with a written personalized care plan for preventive services.   Diabetes: On metformin daily, well controlled.  Lab Results  Component Value Date   HGBA1C 6.0 01/12/2015   Wt Readings from Last 3 Encounters:  01/15/15 132 lb 12 oz (60.215 kg)  10/23/14 130 lb 8 oz (59.194 kg)  10/13/14 133 lb (60.328 kg)  Using medications without difficulties:  some GERD, but better if takes with food. Hypoglycemic episodes: None  Hyperglycemic episodes:None  Feet problems:None  Blood Sugars averaging: 91-101 FBS, 2 hours post prandial 121.  eye exam within last year: In last year, had floater in last week, but resolved now.   Elevated Cholesterol: At goal on simvastatin 40 mg daily, fish oil.  Lab Results  Component Value Date   CHOL 138 01/12/2015   HDL 56.20 01/12/2015   LDLCALC 69 01/12/2015   TRIG 66.0 01/12/2015   CHOLHDL 2 01/12/2015   Using medications without problems: yes  Muscle aches: None  Other complaints:   waking with friends.  Gout: no further gout flares. Used colchicine as needed.  Uric acid in nml range in 04/2013  Hypertension: Well controlled on ramipril for her age. BP Readings from Last 3 Encounters:   01/15/15 142/81  10/23/14 132/83  10/13/14 102/58   Using medication without problems or lightheadedness:  Chest pain with exertion: none  Edema:None  Short of breath:None  Average home BPs: not checking BP  Other issues:     Review of Systems  Constitutional: Negative for fever and fatigue.  HENT: Negative for ear pain.  Eyes: Negative for pain.  Respiratory: Negative for cough, chest tightness and shortness of breath.  Cardiovascular: Negative for chest pain, palpitations and leg swelling.  Gastrointestinal: Negative for nausea, diarrhea, occ constipation, straining with BMs blood in stool and rectal pain.  Genitourinary: Negative for dysuria, vaginal bleeding, vaginal discharge, vaginal pain and pelvic pain.  No breast lesions.  Neurological: Negative for tremors, syncope, weakness and numbness.  Psychiatric/Behavioral: Negative for self-injury, dysphoric mood and agitation. The patient is not nervous/anxious.  Objective:   Physical Exam  Constitutional: Vital signs are normal. She appears well-developed and well-nourished. She is cooperative. Non-toxic appearance. She does not appear ill. No distress.  Elderly female in NAD  HENT:  Head: Normocephalic.  Right Ear: Hearing, tympanic membrane, external ear and ear canal normal.  Left Ear: Hearing, tympanic membrane, external ear and ear canal normal.  Nose: Nose normal.  Eyes: Conjunctivae, EOM and lids are normal. Pupils are equal, round, and reactive to light. No foreign bodies found.  Neck: Trachea normal and normal range of motion. Neck supple. Carotid bruit is not present. No mass and no thyromegaly present.  Cardiovascular: Normal rate, regular  rhythm, S1 normal, S2 normal, normal heart sounds and intact distal pulses. Exam reveals no gallop.  No murmur heard.  Pulmonary/Chest: Effort normal and breath sounds normal. No respiratory distress. She has no wheezes. She has no rhonchi. She has no  rales.  Abdominal: Soft. Normal appearance and bowel sounds are normal. She exhibits no distension, no fluid wave, no abdominal bruit and no mass. There is no hepatosplenomegaly. There is no tenderness. There is no rebound, no guarding and no CVA tenderness. No hernia.  Lymphadenopathy:  She has no cervical adenopathy.  She has no axillary adenopathy.  Neurological: She is alert. She has normal strength. No cranial nerve deficit or sensory deficit.  Skin: Skin is warm, dry and intact. No rash noted.  Psychiatric: Her speech is normal and behavior is normal. Judgment normal. Her mood appears not anxious. Cognition and memory are normal. She does not exhibit a depressed mood.  MSK: decreased ROM B , no pain. Diabetic foot exam:  Normal inspection  No skin breakdown  No calluses  Normal DP pulses  Normal sensation to light touch and monofilament  Nails normal  Assessment & Plan:   Annual Medicare Wellnes: The patient's preventative maintenance and recommended screening tests for an annual wellness exam were reviewed in full today.  Brought up to date unless services declined.  Counselled on the importance of diet, exercise, and its role in overall health and mortality.  The patient's FH and SH was reviewed, including their home life, tobacco status, and drug and alcohol status.   DEXA 2014 stable and slightly improved in spine Has been on fosamax for 4 years. Will stop fosamax and rec in 2 year.. Due 2016. Colonoscopy in 2009,Dr. Edwards recommended no further needed. Up to Date with vaccines , will return for flu shot  Will stop mammograms. No pap or vaginal exam needed.

## 2015-01-15 NOTE — Assessment & Plan Note (Signed)
Well contorlled on metformin. GFR remains > 45. Check every 6 months.

## 2015-01-15 NOTE — Assessment & Plan Note (Signed)
stable no recent flares

## 2015-01-19 ENCOUNTER — Ambulatory Visit (INDEPENDENT_AMBULATORY_CARE_PROVIDER_SITE_OTHER): Payer: Medicare Other

## 2015-01-19 DIAGNOSIS — Z23 Encounter for immunization: Secondary | ICD-10-CM

## 2015-02-09 DIAGNOSIS — M810 Age-related osteoporosis without current pathological fracture: Secondary | ICD-10-CM | POA: Diagnosis not present

## 2015-02-16 ENCOUNTER — Encounter: Payer: Self-pay | Admitting: Family Medicine

## 2015-02-23 ENCOUNTER — Encounter: Payer: Self-pay | Admitting: Family Medicine

## 2015-02-23 ENCOUNTER — Ambulatory Visit (INDEPENDENT_AMBULATORY_CARE_PROVIDER_SITE_OTHER): Payer: Medicare Other | Admitting: Family Medicine

## 2015-02-23 ENCOUNTER — Telehealth: Payer: Self-pay | Admitting: Family Medicine

## 2015-02-23 VITALS — BP 138/72 | HR 77 | Temp 97.2°F | Ht 63.0 in | Wt 136.2 lb

## 2015-02-23 DIAGNOSIS — M810 Age-related osteoporosis without current pathological fracture: Secondary | ICD-10-CM

## 2015-02-23 DIAGNOSIS — K219 Gastro-esophageal reflux disease without esophagitis: Secondary | ICD-10-CM

## 2015-02-23 MED ORDER — PANTOPRAZOLE SODIUM 40 MG PO TBEC: 40.0000 mg | DELAYED_RELEASE_TABLET | Freq: Every day | ORAL | Status: DC

## 2015-02-23 NOTE — Assessment & Plan Note (Addendum)
Unable to tolerate fosamax in past, especially now with poor control GERD.  Will look into other options such as IV boniva versus prolia. Pt would prefer prolia for ease of use given difficulty in ambulation and getting to office frequently.

## 2015-02-23 NOTE — Telephone Encounter (Signed)
[name] notified as instructed by telephone.  She will come by office to pick up samples.

## 2015-02-23 NOTE — Telephone Encounter (Signed)
Patient called about  The Pantoprazole called in to her pharmacy today.  Patient said when she called the pharmacy her insurance doesn't cover the medication. The pharmacy was going to notify Dr.Bedsole.

## 2015-02-23 NOTE — Telephone Encounter (Signed)
Have her come by office to pick up sample boxes I have of nexium to take 40 mg daily. If working well after 1-2 weeks.. Will call in rx for 4-6 weeks of treatments.

## 2015-02-23 NOTE — Telephone Encounter (Signed)
Spoke with [name] at [facility].  He states the insurance did not give him a kick back as to what they would cover.   He ran a dummy Rx through for Nexium and states they will cover Nexium Brand Name for $45.  Please advise.

## 2015-02-23 NOTE — Addendum Note (Signed)
Addended by: Eliezer Lofts E on: 02/23/2015 12:41 PM   Modules accepted: Orders, Medications

## 2015-02-23 NOTE — Progress Notes (Signed)
Pre visit review using our clinic review tool, if applicable. No additional management support is needed unless otherwise documented below in the visit note. 

## 2015-02-23 NOTE — Assessment & Plan Note (Addendum)
Poor control. Change to alternate PPI, avoid triggers. If not improving in 2 weeks consider GI referral.

## 2015-02-23 NOTE — Progress Notes (Signed)
   Subjective:    Patient ID: [name], female    DOB: February 20, 1923, 81 y.o.   MRN: [id number]  HPI  81 year old female with osteoporosis presents to discuss treatment options given recent DEXA results.  She is on ca and vit D. She was on fosamax in past up until 2014 when stopped given to length of time on the medication.   From 2014 lowest in femur -2.5 now at -3.2 She is at significantly high risk from falling.  She has significant reflux and heartburn, she is using Tums every few days, prilosec 20 mg  X 4 weeks did not help much.. Was worse with with fosamax, intolerable. She is trying to avoid acid foods.     Review of Systems  Constitutional: Negative for fever and fatigue.  HENT: Negative for ear pain.   Eyes: Negative for pain.  Respiratory: Negative for chest tightness and shortness of breath.   Cardiovascular: Negative for chest pain, palpitations and leg swelling.  Gastrointestinal: Positive for nausea. Negative for abdominal pain.  Genitourinary: Negative for dysuria.       Objective:   Physical Exam  Constitutional: Vital signs are normal. She appears well-developed and well-nourished. She is cooperative.  Non-toxic appearance. She does not appear ill. No distress.  Elderly female in NAD.  HENT:  Head: Normocephalic.  Right Ear: Hearing, tympanic membrane, external ear and ear canal normal. Tympanic membrane is not erythematous, not retracted and not bulging.  Left Ear: Hearing, tympanic membrane, external ear and ear canal normal. Tympanic membrane is not erythematous, not retracted and not bulging.  Nose: No mucosal edema or rhinorrhea. Right sinus exhibits no maxillary sinus tenderness and no frontal sinus tenderness. Left sinus exhibits no maxillary sinus tenderness and no frontal sinus tenderness.  Mouth/Throat: Uvula is midline, oropharynx is clear and moist and mucous membranes are normal.  Eyes: Conjunctivae, EOM and lids are normal. Pupils are equal,  round, and reactive to light. Lids are everted and swept, no foreign bodies found.  Neck: Trachea normal and normal range of motion. Neck supple. Carotid bruit is not present. No thyroid mass and no thyromegaly present.  Cardiovascular: Normal rate, regular rhythm, S1 normal, S2 normal, normal heart sounds, intact distal pulses and normal pulses.  Exam reveals no gallop and no friction rub.   No murmur heard. Pulmonary/Chest: Effort normal and breath sounds normal. No tachypnea. No respiratory distress. She has no decreased breath sounds. She has no wheezes. She has no rhonchi. She has no rales.  Abdominal: Soft. Normal appearance and bowel sounds are normal. There is tenderness in the epigastric area. There is no guarding and no CVA tenderness.  Musculoskeletal:       Right shoulder: She exhibits decreased range of motion. She exhibits no tenderness and no bony tenderness.  Pain only with int and ext rotaation , abduction , none on palpation.  Neurological: She is alert.  Skin: Skin is warm, dry and intact. No rash noted.  Psychiatric: Her speech is normal and behavior is normal. Judgment and thought content normal. Her mood appears not anxious. Cognition and memory are normal. She does not exhibit a depressed mood.          Assessment & Plan:

## 2015-02-23 NOTE — Telephone Encounter (Signed)
What do they cover better? nexium 40 ?

## 2015-02-23 NOTE — Patient Instructions (Addendum)
We will call you with further recommendations about osteoporosis treatment.  Start pantoprazole 40 mg daily.  Avoid acidic foods.  Call if heartburn not improving in 2 weeks on new medication.

## 2015-02-26 ENCOUNTER — Telehealth: Payer: Self-pay | Admitting: Family Medicine

## 2015-02-26 NOTE — Telephone Encounter (Signed)
Let pt know

## 2015-02-26 NOTE — Telephone Encounter (Signed)
I have electronically submitted pt's info for [employer] insurance verification and will notify you once I have a response. Thank you. °

## 2015-02-26 NOTE — Telephone Encounter (Signed)
Left message for [name] that we are waiting for a response from her insurance about the Prolia injections.  We will call her as soon as we know anything.

## 2015-03-01 NOTE — Telephone Encounter (Signed)
I have rec'd pt's insurance verification from [employer] and she has an estimated responsibility of 0% plus $166 if not already met.  I have sent a copy of the summary of benefits to be scanned into her chart.    If [name] has not met any or all of her deductible and cannot afford the injection then please advise her to contact [employer] at [phone] and select option #1 to see if she qualifies for one of their assistance programs.  If she qualifies they will instruct her how to proceed.  Please send me the actual injection date once she rec's her injection so that I can update the [employer] portal.  If you have any questions please let me know. Thank you.  Cc:Dee for ordering purposes

## 2015-03-01 NOTE — Telephone Encounter (Addendum)
Ms. [name] notified as instructed by telephone.  She will need to have a calcium level drawn prior to the Prolia injection.  Ms. [name] is inquiring about the once a year infusion medication Reclast.  She wants to know what the difference is between the Reclast and the Prolia.  She also wants to know what Dr. Diona [name] recommends.  Please advise.

## 2015-03-02 NOTE — Telephone Encounter (Signed)
Left message for [name] to return my call. 

## 2015-03-02 NOTE — Telephone Encounter (Signed)
Noted. Will hold order.

## 2015-03-02 NOTE — Telephone Encounter (Signed)
Patient returned [name]'s call.  Please call patient back at [phone]. °

## 2015-03-02 NOTE — Telephone Encounter (Signed)
Reclast is an outpatient infusion which is completed at the hospital.  They will be more able to check insurance verification for it.  I have put a PHYSICIAN'S ORDERS form in your in-box to order the Reclast.  Once complete, Butch Penny should be able to schedule.  Please let me know if you have any questions. Thank you.

## 2015-03-02 NOTE — Telephone Encounter (Signed)
Let pt know reclast would be a good option for her... Done through outpt infusions.  I will put in an order for it.

## 2015-03-02 NOTE — Telephone Encounter (Signed)
[name] notified as instructed by telephone.  She wants to discuss both options with [name] and call me back with her decision.  She is aware that a lab appointment will be needed first to check her calcium level and creatinine level before we can do either treatment.

## 2015-03-02 NOTE — Telephone Encounter (Signed)
[name].. Pt states she would prefer reclast injection.. This is an okay option for her.. Can you let me know how well this would be covered for her versus the prolia?

## 2015-03-03 ENCOUNTER — Telehealth: Payer: Self-pay | Admitting: Family Medicine

## 2015-03-03 ENCOUNTER — Other Ambulatory Visit (INDEPENDENT_AMBULATORY_CARE_PROVIDER_SITE_OTHER): Payer: Medicare Other

## 2015-03-03 DIAGNOSIS — M810 Age-related osteoporosis without current pathological fracture: Secondary | ICD-10-CM | POA: Diagnosis not present

## 2015-03-03 LAB — CALCIUM: CALCIUM: 9.9 mg/dL (ref 8.4–10.5)

## 2015-03-03 NOTE — Telephone Encounter (Signed)
Pt returned your call.  

## 2015-03-03 NOTE — Telephone Encounter (Signed)
Spoke with Ms. [name].  She has decided she would like to do the Prolia injection.  Lab appointment scheduled for today at 3:15 pm for Calcium level.  Once we have that result back if everything is normal then when can set up nurse visit for the Prolia injections.   Ms. [name] wanted to let [name] also know that her 79 year old son was just diagnosed with Pancreatic Cancer.  She is also requesting a handicap placard application to be filled out.  Hers has expired.  Handicap Placard completed and placed in Dr. Rometta [name] in box for signature.

## 2015-03-03 NOTE — Telephone Encounter (Signed)
Noted. Move forward with prolia, please.

## 2015-03-04 NOTE — Telephone Encounter (Signed)
Left message for [name] that her calcium level was normal and to call the office to schedule a nurse visit for her Prolia injection.

## 2015-03-05 NOTE — Telephone Encounter (Signed)
Nurse visit scheduled for 03/10/2015 at 3:00pm for Prolia Injection.

## 2015-03-08 ENCOUNTER — Encounter: Payer: Self-pay | Admitting: Internal Medicine

## 2015-03-08 ENCOUNTER — Ambulatory Visit (INDEPENDENT_AMBULATORY_CARE_PROVIDER_SITE_OTHER): Payer: Medicare Other | Admitting: Internal Medicine

## 2015-03-08 VITALS — BP 120/70 | HR 81 | Temp 97.3°F | Wt 136.0 lb

## 2015-03-08 DIAGNOSIS — J069 Acute upper respiratory infection, unspecified: Secondary | ICD-10-CM | POA: Diagnosis not present

## 2015-03-08 MED ORDER — ESOMEPRAZOLE MAGNESIUM 40 MG PO PACK: 40.0000 mg | PACK | Freq: Every day | ORAL | Status: DC

## 2015-03-08 NOTE — Progress Notes (Signed)
Subjective:    Patient ID: [name], female    DOB: 05-Nov-1922, 81 y.o.   MRN: [id number]  HPI Here due to sinus symptoms  3 days ago--started with nasal congestion bilaterally and didn't feel well Took it easy since then Some discharge from nose last night Some throat soreness--but feels overall better Still with congestion  Some low back pain 2 days ago Better this morning  No fever No cough No SOB No ear pain Some winter allergies--can't tell if it is this  On flonase spray still Tried tylenol for her back--no other meds  Current Outpatient Prescriptions on File Prior to Visit  Medication Sig Dispense Refill  . aspirin EC 81 MG tablet Take 40.5 mg by mouth at bedtime.     . Blood Glucose Monitoring Suppl (ACCU-CHEK AVIVA PLUS) W/DEVICE KIT Use to check blood sugar daily as needed.  Dx: E11.9 1 kit 0  . Calcium Citrate (CITRACAL PO) Take 1 capsule by mouth daily.     . fish oil-omega-3 fatty acids 1000 MG capsule Take 1 g by mouth daily.      . fluticasone (FLONASE) 50 MCG/ACT nasal spray PLACE 2 SPRAYS INTO BOTH NOSTRILS DAILY. 16 g 11  . glucose blood (ACCU-CHEK AVIVA PLUS) test strip Use to check blood sugar daily as needed.  Dx: E11.9 100 each 3  . Lancets (FREESTYLE) lancets Use to check blood sugar once daily.  Dx: E11.9 100 each 3  . metFORMIN (GLUCOPHAGE) 500 MG tablet TAKE 1 TABLET (500 MG TOTAL) BY MOUTH DAILY WITH BREAKFAST. 30 tablet 5  . Multiple Vitamin (MULTIVITAMIN) tablet Take 1 tablet by mouth daily.      Vladimir Faster Glycol-Propyl Glycol (SYSTANE OP) Apply 1 drop to eye daily as needed (for dry eyes).    . ramipril (ALTACE) 5 MG capsule     . simvastatin (ZOCOR) 40 MG tablet Take 1 tablet (40 mg total) by mouth at bedtime. 90 tablet 1   No current facility-administered medications on file prior to visit.    Allergies  Allergen Reactions  . Atorvastatin     REACTION: intolerant  . Codeine     REACTION: GI upset  . Rosuvastatin    REACTION: intolerant    Past Medical History  Diagnosis Date  . Hyperlipidemia   . Hypertension   . Diabetes mellitus     Type II  . GERD (gastroesophageal reflux disease)   . Allergy   . Syncope and collapse     1 episode  . Orthostatic hypotension 04/2006    Hospital  . Palpitations   . Acute pharyngitis   . Other abscess of vulva   . Acute sinusitis, unspecified   . Acute upper respiratory infections of unspecified site   . Hemangioma of unspecified site   . Anal and rectal polyp   . Cataract     History of  . Osteopenia   . Osteoarthritis   . Gout   . Cavernous angioma   . Degenerative cervical disc 02/2006    Past Surgical History  Procedure Laterality Date  . Abdominal hysterectomy    . Cataract extraction    . Cavernous hemangioma  04/2004    Hospital  . Cavernous angioma    . Aortic dopplers      Mild plaque.  EEG okay  . Total knee arthroplasty      Right    Family History  Problem Relation Age of Onset  . Cancer Maternal Aunt  Breast CA  . Heart disease Other     CHF    Social History   Social History  . Marital Status: Married    Spouse Name: N/A  . Number of Children: 3  . Years of Education: N/A   Occupational History  . 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 Smoking status: Former Research scientist (life sciences)  . Smokeless tobacco: Never Used     Comment: quit 1970  . Alcohol Use: No  . Drug Use: No  . Sexual Activity: Not on file   Other Topics Concern  . Not on file   Social History Narrative   Diet: fruit and veggies, water, lean protein.   Activity limited secondary to knee discomfort/arthritis, but going to church exersice group two times a week.                Review of Systems  No rash No vomiting or diarrhea Appetite is fine Taking nexium--this has helped her reflux    Objective:   Physical Exam  Constitutional: She appears well-developed. No distress.  HENT:  Mouth/Throat:  Oropharynx is clear and moist. No oropharyngeal exudate.  No sinus tenderness TMs normal Moderate nasal inflammation   Neck: Normal range of motion. Neck supple.  Pulmonary/Chest: Effort normal and breath sounds normal. No respiratory distress. She has no wheezes. She has no rales.  Lymphadenopathy:    She has no cervical adenopathy.          Assessment & Plan:

## 2015-03-08 NOTE — Patient Instructions (Signed)
Please take tylenol 500 or 650mg  three times daily for sore throat, etc. Please let us know if you develop high fever, bad cough or shortness or breath.

## 2015-03-08 NOTE — Progress Notes (Signed)
Pre visit review using our clinic review tool, if applicable. No additional management support is needed unless otherwise documented below in the visit note. 

## 2015-03-08 NOTE — Assessment & Plan Note (Signed)
Reassured--no Rx needed Discussed supportive care--- mostly just tylenol Discussed warning symptoms of secondary infection

## 2015-03-09 ENCOUNTER — Telehealth: Payer: Self-pay

## 2015-03-09 MED ORDER — ESOMEPRAZOLE MAGNESIUM 40 MG PO CPDR: 40.0000 mg | DELAYED_RELEASE_CAPSULE | Freq: Every day | ORAL | Status: DC

## 2015-03-09 NOTE — Telephone Encounter (Signed)
Pt left v/m; pt wanted Dr Diona Browner to know that the nexium helped the acid reflux much better but reflux not completely erased. Should pt continue nexium 40 mg daily?

## 2015-03-09 NOTE — Telephone Encounter (Signed)
Ms. [name] notified as instructed by telephone.  She was seen by Dr. Silvio Pate yesterday who sent in a Rx for Nexium packets instead of capsules.  New Rx sent to her pharmacy for Nexium 40 mg capsules.  Advised to only take one capsule daily and use the ranitidine for breakthrough symptoms.  She also states she has a cold and wants to make sure that will not interfere with her Prolia injection scheduled for Wednesday.  I advised it will be okay to go ahead with her Prolia injection as scheduled.

## 2015-03-09 NOTE — Telephone Encounter (Signed)
Yes continue 40 mg daily. Can add OTC ranitidine as needed for breakthrough symptoms.

## 2015-03-10 ENCOUNTER — Ambulatory Visit (INDEPENDENT_AMBULATORY_CARE_PROVIDER_SITE_OTHER): Payer: Medicare Other | Admitting: *Deleted

## 2015-03-10 DIAGNOSIS — M810 Age-related osteoporosis without current pathological fracture: Secondary | ICD-10-CM | POA: Diagnosis not present

## 2015-03-10 MED ORDER — DENOSUMAB 60 MG/ML ~~LOC~~ SOLN
60.0000 mg | Freq: Once | SUBCUTANEOUS | Status: AC
  Administered 2015-03-10: 60 mg via SUBCUTANEOUS

## 2015-03-16 ENCOUNTER — Ambulatory Visit (INDEPENDENT_AMBULATORY_CARE_PROVIDER_SITE_OTHER): Payer: Medicare Other | Admitting: Podiatry

## 2015-03-16 ENCOUNTER — Ambulatory Visit (INDEPENDENT_AMBULATORY_CARE_PROVIDER_SITE_OTHER): Payer: Medicare Other | Admitting: Family Medicine

## 2015-03-16 ENCOUNTER — Encounter: Payer: Self-pay | Admitting: Podiatry

## 2015-03-16 ENCOUNTER — Ambulatory Visit (INDEPENDENT_AMBULATORY_CARE_PROVIDER_SITE_OTHER): Payer: Medicare Other

## 2015-03-16 ENCOUNTER — Encounter: Payer: Self-pay | Admitting: Family Medicine

## 2015-03-16 VITALS — BP 119/67 | HR 77 | Temp 97.4°F | Ht 63.0 in | Wt 135.5 lb

## 2015-03-16 DIAGNOSIS — M79676 Pain in unspecified toe(s): Secondary | ICD-10-CM

## 2015-03-16 DIAGNOSIS — B351 Tinea unguium: Secondary | ICD-10-CM

## 2015-03-16 DIAGNOSIS — J069 Acute upper respiratory infection, unspecified: Secondary | ICD-10-CM | POA: Diagnosis not present

## 2015-03-16 DIAGNOSIS — J302 Other seasonal allergic rhinitis: Secondary | ICD-10-CM

## 2015-03-16 DIAGNOSIS — R609 Edema, unspecified: Secondary | ICD-10-CM | POA: Diagnosis not present

## 2015-03-16 NOTE — Progress Notes (Signed)
Pre visit review using our clinic review tool, if applicable. No additional management support is needed unless otherwise documented below in the visit note. 

## 2015-03-16 NOTE — Progress Notes (Signed)
   Subjective:    Patient ID: [name], female    DOB: 1922-12-19, 81 y.o.   MRN: [id number]  HPI This patient presents today for a scheduled visit complaining of painful toenails and walking wearing shoes and request nail debridement. Also, as patient is concerned about intermittent pain and swelling over the last several months with gradual increase in the redness and swelling in the last several weeks. She denies any pain in the area.  This patient is a type II diabetic without history of claudication, amputation or skin ulceration   Review of Systems  Musculoskeletal: Positive for joint swelling and gait problem.       Objective:   Physical Exam  Patient appears orientated 3  Vascular: DP and PT pulses 2/4 bilaterally Capillary reflex immediate bilaterally  Neurological: Sensation to 10 g monofilament wire intact 3/5 right and 5/5 left Vibratory sensation nonreactive bilaterally Ankle reflex equal and reactive bilaterally  Dermatological: Atrophic skin bilaterally Low-grade erythema and edema dorsal left forefoot. There are no open skin lesions in this area. There is no increase warmth noted in this area The toenails are elongated, brittle, hypertrophic, deformed and tender to direct palpation 6-10  Musculoskeletal: There is no restriction ankle, subtalar, midtarsal joints bilaterally Deep palpation in the dorsal second through fifth metatarsal shafts causes no discomfort or palpable lesions. There is no restriction in range of motion one through 5 MPJ left    X-ray examination weightbearing left foot dated 03/16/2015  Intact bony structure without fracture and/or dislocation Bone and joint spaces adequate Hammertoe second Inferior calcaneal spur  Radiographic impression: No acute bony abnormality noted in the left foot       Assessment & Plan:   Assessment: Satisfactory vascular status Mild peripheral neuropathy Edema, erythema undetermined  origin No x-ray evidence of bone activity Long history of intermittent pain and erythema in the dorsal left forefoot area Symptomatic onychomycoses 6-10  Plan: Today I review the results of examination x-ray with patient today. At this time I am going to recommend that we observe the left foot the see if there is any sign of progression of pain or swelling. I would like patient return in 2 weeks if she is any significant increase in swelling, redness, warmth and left foot present to the office immediately or emergency department  The toenails 10 were debrided mechanically and electrically Slight pinpoint bleeding distal right toe treated with topical antibiotic and Band-Aid  Reappoint 2 weeks

## 2015-03-16 NOTE — Patient Instructions (Signed)
Can try zyrtec at bedtime for possible allergy component.  Continue flonase.  Call if not continuing to feel better.

## 2015-03-16 NOTE — Progress Notes (Signed)
   Subjective:    Patient ID: [name], female    DOB: 09/28/1922, 81 y.o.   MRN: [id number]  HPI   81 year old female  With history of DM, HTNpresents for follow up  Viral URI.  She saw Dr. Silvio Pate on 11/14 with 3 days of congestion, sore throat. She has continued to improve in last week. Main symptoms at this point is nasal congestion and post nasal drip. Mucus is white, clear.   She has son with pancreatic cancer and is concerned of passing this to him.   No cough, no fever, no SOB, no wheeze. No ear pain  She is fatigued.   She is using flonase, tylenol.   BP Readings from Last 3 Encounters:  03/16/15 119/67  03/08/15 120/70  02/23/15 138/72       Review of Systems  Constitutional: Positive for fatigue. Negative for fever.  HENT: Negative for ear pain.   Eyes: Negative for pain.  Respiratory: Negative for cough and shortness of breath.   Cardiovascular: Negative for chest pain and leg swelling.       Objective:   Physical Exam  Constitutional: Vital signs are normal. She appears well-developed and well-nourished. She is cooperative.  Non-toxic appearance. She does not appear ill. No distress.  HENT:  Head: Normocephalic.  Right Ear: Hearing, tympanic membrane, external ear and ear canal normal. Tympanic membrane is not erythematous, not retracted and not bulging.  Left Ear: Hearing, tympanic membrane, external ear and ear canal normal. Tympanic membrane is not erythematous, not retracted and not bulging.  Nose: Mucosal edema and rhinorrhea present. Right sinus exhibits no maxillary sinus tenderness and no frontal sinus tenderness. Left sinus exhibits no maxillary sinus tenderness and no frontal sinus tenderness.  Mouth/Throat: Uvula is midline, oropharynx is clear and moist and mucous membranes are normal.  Eyes: Conjunctivae, EOM and lids are normal. Pupils are equal, round, and reactive to light. Lids are everted and swept, no foreign bodies found.  Neck:  Trachea normal and normal range of motion. Neck supple. Carotid bruit is not present. No thyroid mass and no thyromegaly present.  Cardiovascular: Normal rate, regular rhythm, S1 normal, S2 normal, normal heart sounds, intact distal pulses and normal pulses.  Exam reveals no gallop and no friction rub.   No murmur heard. Pulmonary/Chest: Effort normal and breath sounds normal. No tachypnea. No respiratory distress. She has no decreased breath sounds. She has no wheezes. She has no rhonchi. She has no rales.  Neurological: She is alert.  Skin: Skin is warm, dry and intact. No rash noted.  Psychiatric: Her speech is normal and behavior is normal. Judgment normal. Her mood appears not anxious. Cognition and memory are normal. She does not exhibit a depressed mood.          Assessment & Plan:

## 2015-03-16 NOTE — Assessment & Plan Note (Signed)
Add zyrtec to  flonase for possible allergy component.

## 2015-03-16 NOTE — Patient Instructions (Signed)
Today you had a long history of intermittent swelling and redness in the dorsal aspect the left foot . Your x-ray today demonstrates no bone activity I would like you to return in 2 weeks to evaluate the low-grade swelling redness and left foot. If he develops sudden increase in pain, swelling, redness, warmth call for immediate appointment or present to emergency department Remove the Band-Aid on the right great toe 1-2 days and apply topical antibiotic ointment until the small bleeding area in the right great toe until a scab forms Diabetes and Foot Care Diabetes may cause you to have problems because of poor blood supply (circulation) to your feet and legs. This may cause the skin on your feet to become thinner, break easier, and heal more slowly. Your skin may become dry, and the skin may peel and crack. You may also have nerve damage in your legs and feet causing decreased feeling in them. You may not notice minor injuries to your feet that could lead to infections or more serious problems. Taking care of your feet is one of the most important things you can do for yourself.  HOME CARE INSTRUCTIONS  Wear shoes at all times, even in the house. Do not go barefoot. Bare feet are easily injured.  Check your feet daily for blisters, cuts, and redness. If you cannot see the bottom of your feet, use a mirror or ask someone for help.  Wash your feet with warm water (do not use hot water) and mild soap. Then pat your feet and the areas between your toes until they are completely dry. Do not soak your feet as this can dry your skin.  Apply a moisturizing lotion or petroleum jelly (that does not contain alcohol and is unscented) to the skin on your feet and to dry, brittle toenails. Do not apply lotion between your toes.  Trim your toenails straight across. Do not dig under them or around the cuticle. File the edges of your nails with an emery board or nail file.  Do not cut corns or calluses or try to  remove them with medicine.  Wear clean socks or stockings every day. Make sure they are not too tight. Do not wear knee-high stockings since they may decrease blood flow to your legs.  Wear shoes that fit properly and have enough cushioning. To break in new shoes, wear them for just a few hours a day. This prevents you from injuring your feet. Always look in your shoes before you put them on to be sure there are no objects inside.  Do not cross your legs. This may decrease the blood flow to your feet.  If you find a minor scrape, cut, or break in the skin on your feet, keep it and the skin around it clean and dry. These areas may be cleansed with mild soap and water. Do not cleanse the area with peroxide, alcohol, or iodine.  When you remove an adhesive bandage, be sure not to damage the skin around it.  If you have a wound, look at it several times a day to make sure it is healing.  Do not use heating pads or hot water bottles. They may burn your skin. If you have lost feeling in your feet or legs, you may not know it is happening until it is too late.  Make sure your health care provider performs a complete foot exam at least annually or more often if you have foot problems. Report any cuts, sores,  or bruises to your health care provider immediately. SEEK MEDICAL CARE IF:   You have an injury that is not healing.  You have cuts or breaks in the skin.  You have an ingrown nail.  You notice redness on your legs or feet.  You feel burning or tingling in your legs or feet.  You have pain or cramps in your legs and feet.  Your legs or feet are numb.  Your feet always feel cold. SEEK IMMEDIATE MEDICAL CARE IF:   There is increasing redness, swelling, or pain in or around a wound.  There is a red line that goes up your leg.  Pus is coming from a wound.  You develop a fever or as directed by your health care provider.  You notice a bad smell coming from an ulcer or wound.    This information is not intended to replace advice given to you by your health care provider. Make sure you discuss any questions you have with your health care provider.   Document Released: 04/07/2000 Document Revised: 12/11/2012 Document Reviewed: 09/17/2012 Elsevier Interactive Patient Education [employer].

## 2015-03-16 NOTE — Assessment & Plan Note (Signed)
Resolving. Continue symptomatic care.

## 2015-03-19 ENCOUNTER — Other Ambulatory Visit: Payer: Self-pay | Admitting: Family Medicine

## 2015-03-24 ENCOUNTER — Ambulatory Visit (INDEPENDENT_AMBULATORY_CARE_PROVIDER_SITE_OTHER): Payer: Medicare Other | Admitting: Podiatry

## 2015-03-24 ENCOUNTER — Encounter: Payer: Self-pay | Admitting: Podiatry

## 2015-03-24 ENCOUNTER — Ambulatory Visit (INDEPENDENT_AMBULATORY_CARE_PROVIDER_SITE_OTHER): Payer: Medicare Other

## 2015-03-24 ENCOUNTER — Telehealth: Payer: Self-pay | Admitting: *Deleted

## 2015-03-24 VITALS — BP 148/70 | HR 74 | Temp 97.8°F | Resp 12

## 2015-03-24 DIAGNOSIS — R609 Edema, unspecified: Secondary | ICD-10-CM

## 2015-03-24 DIAGNOSIS — R52 Pain, unspecified: Secondary | ICD-10-CM

## 2015-03-24 MED ORDER — CEPHALEXIN 250 MG PO CAPS: 250.0000 mg | ORAL_CAPSULE | Freq: Four times a day (QID) | ORAL | Status: DC

## 2015-03-24 NOTE — Progress Notes (Signed)
   Subjective:    Patient ID: [name], female    DOB: Dec 24, 1922, 81 y.o.   MRN: [id number]  HPI This patient presents again 1 week prior to schedule visit from the visit of 03/16/2015. On that visit of 03/16/2015 there is a several month history of a intermittent swelling pain and redness in the dorsal aspect left foot which increased and symptoms in the past several weeks. On the initial visit x-ray was taken and no bone activity was noted and observation was recommended. Patient presents today complaining of increasing pain and discomfort in the left foot with weightbearing and wants further evaluation   Review of Systems  Musculoskeletal: Positive for joint swelling and gait problem.       Objective:   Physical Exam  Orientated 3 BP 148/70 Pulse 74 Respiration 12 Temperature 97.8  Vascular: Mild nonpitting edema Doda dorsum of left foot DP and PT pulses 2/4 bilaterally Capillary reflex immediate bilaterally  Neurological: Sensation to 10 g monofilament wire intact 4/5 right 5/5 left Vibratory nonreactive bilaterally Ankle reflex equal and reactive bilaterally  Dermatological: Atrophic skin bilaterally Low-grade erythema, edema dorsal left foot There is no open skin lesions in the feet bilaterally   Musculoskeletal: Hammertoe second left There is palpable tenderness in the dorsal midfoot area without any palpable lesions  X-ray examination weightbearing left foot dated 03/24/2015  Intact bony structures without fracture and/or dislocation No emphysema noted Hammertoe second Inferior calcaneal spur  Radiographic impression: No acute bony abnormality noted left foot    Assessment & Plan:   Assessment: Pain swelling left foot undetermined origin Rule out inflammatory disease Consider possible Charcot's foot left Possible cellulitis, left foot  Plan: Today I reviewed the results of the exam and x-ray and made patient aware that I could not give her  a definitive diagnosis at this time Referred patient to the lab for CBC with differential, uric acid, ANA, rheumatoid arthritis, sedimentation rate, C-reactive protein For prophylaxis Rx cephalexin 250 mg #28 sig 1 by mouth 4 times a day 7 days, 1 refill Surgical shoe dispensed to wear in the left foot when walking at home, not driving Patient advised if she developed any sudden increase in pain, swelling, warmth present to the ED  Reevaluate 7 days

## 2015-03-24 NOTE — Telephone Encounter (Addendum)
Pt states since her visit with Dr. Amalia Hailey last week her left foot has been swollen and painful and she would like to get in to see him sooner than 03/30/2015.  Pt is scheduled for this morning with Dr. Amalia Hailey.

## 2015-03-24 NOTE — Patient Instructions (Signed)
Today your x-ray was negative for bone activity I'm referring you to the lab for a blood count and arthritis profile I am dispensing a surgical shoe free to wear on the left foot Also, for prophylaxis I am prescribing cephalexin 250 mg by mouth 1 capsule 4 times a day 7 days Return 7 days for evaluation If you notice any sudden increase in pain, swelling, warmness present to emergency department

## 2015-03-25 LAB — CBC WITH DIFFERENTIAL/PLATELET
BASOS ABS: 0 10*3/uL (ref 0.0–0.1)
Basophils Relative: 0 % (ref 0–1)
EOS PCT: 2 % (ref 0–5)
Eosinophils Absolute: 0.3 10*3/uL (ref 0.0–0.7)
HEMATOCRIT: 43.2 % (ref 36.0–46.0)
HEMOGLOBIN: 14 g/dL (ref 12.0–15.0)
LYMPHS ABS: 1.9 10*3/uL (ref 0.7–4.0)
LYMPHS PCT: 13 % (ref 12–46)
MCH: 30.4 pg (ref 26.0–34.0)
MCHC: 32.4 g/dL (ref 30.0–36.0)
MCV: 93.7 fL (ref 78.0–100.0)
MPV: 9.7 fL (ref 8.6–12.4)
Monocytes Absolute: 1 10*3/uL (ref 0.1–1.0)
Monocytes Relative: 7 % (ref 3–12)
NEUTROS ABS: 11.4 10*3/uL — AB (ref 1.7–7.7)
Neutrophils Relative %: 78 % — ABNORMAL HIGH (ref 43–77)
Platelets: 301 10*3/uL (ref 150–400)
RBC: 4.61 MIL/uL (ref 3.87–5.11)
RDW: 13.7 % (ref 11.5–15.5)
WBC: 14.6 10*3/uL — ABNORMAL HIGH (ref 4.0–10.5)

## 2015-03-25 LAB — C-REACTIVE PROTEIN: CRP: <0.5 mg/dL (ref <0.60)

## 2015-03-25 LAB — RHEUMATOID FACTOR: RHEUMATOID FACTOR: 11 [IU]/mL (ref <=14)

## 2015-03-25 LAB — URIC ACID: URIC ACID, SERUM: 4.8 mg/dL (ref 2.4–7.0)

## 2015-03-26 LAB — SEDIMENTATION RATE: Sed Rate: 20 mm/hr (ref 0–30)

## 2015-03-26 LAB — ANA: ANA: NEGATIVE

## 2015-03-30 ENCOUNTER — Encounter: Payer: Self-pay | Admitting: Podiatry

## 2015-03-30 ENCOUNTER — Ambulatory Visit (INDEPENDENT_AMBULATORY_CARE_PROVIDER_SITE_OTHER): Payer: Medicare Other | Admitting: Podiatry

## 2015-03-30 VITALS — BP 142/74 | HR 76 | Temp 98.6°F | Resp 12

## 2015-03-30 DIAGNOSIS — R609 Edema, unspecified: Secondary | ICD-10-CM

## 2015-03-30 DIAGNOSIS — L03116 Cellulitis of left lower limb: Secondary | ICD-10-CM | POA: Diagnosis not present

## 2015-03-30 MED ORDER — CEPHALEXIN 250 MG PO CAPS: 250.0000 mg | ORAL_CAPSULE | Freq: Four times a day (QID) | ORAL | Status: DC

## 2015-03-30 MED ORDER — DICLOFENAC SODIUM 1 % TD GEL: 2.0000 g | Freq: Four times a day (QID) | TRANSDERMAL | Status: DC

## 2015-03-30 NOTE — Patient Instructions (Addendum)
Today your left foot has less swelling and redness Continue taking cephalexin 250 mg 14 times a day and additional 7 days Continue wearing surgical shoe on the left foot

## 2015-03-30 NOTE — Progress Notes (Signed)
Patient ID: [name], female   DOB: 1922/08/15, 81 y.o.   MRN: [id number]  Subjective: This patient presents for follow-up care for erythematous edematous dorsal left foot with initial visit of 03/16/2015. She's had. Of intermittent swelling pain and redness in the dorsal aspect left foot with gradually increase in symptoms the last several weeks. On the visit of 03/24/2015 cephalexin 250 mg by mouth 4 times a day was prescribed. Patient has noticed less swelling and redness since taking the medication. Patient denies any side effects from cephalexin including skin rashes or diarrhea. Also, CBC and arthritic panel was ordered  Review of systems Positive for joint swelling and gait problem  Objective: Orientated 3 Temperature 98.6 BP 142/74  Vascular: PA and PT pulses 2/4 bilaterally Capillary reflex immediate bilaterally  Neurological: Vibratory sensation nonreactive bilaterally Ankle reflexes equal and reactive bilaterally  Dermatological: Atrophic skin bilaterally Low-grade erythema which is beginning to change color to a slight peak appearance of the dorsal aspect left foot Low-grade edema dorsal acid left foot without any increase in temperature No open skin lesions bilaterally  Musculoskeletal The dorsal left midfoot is prominent and mildly tender to direct palpation  Lab results Collection date 03/24/2015 CBC with differential Increased WBC 14.6 Granulocytes slightly increases 78% Absolute granulocyte 11 point for elevated  Sedimentation rate, uric acid, C-reactive protein, rheumatoid factor all within normal limits  Assessment: Probable cellulitis dorsal aspect left foot  Plan: Continue cephalexin 250 mg by mouth 4 times a day an additional 7 days Maintain surgical shoe on left foot Reappoint 7 days and re-x-ray left foot

## 2015-04-06 ENCOUNTER — Ambulatory Visit (INDEPENDENT_AMBULATORY_CARE_PROVIDER_SITE_OTHER): Payer: Medicare Other | Admitting: Podiatry

## 2015-04-06 ENCOUNTER — Ambulatory Visit (INDEPENDENT_AMBULATORY_CARE_PROVIDER_SITE_OTHER): Payer: Medicare Other

## 2015-04-06 ENCOUNTER — Telehealth: Payer: Self-pay | Admitting: *Deleted

## 2015-04-06 ENCOUNTER — Encounter: Payer: Self-pay | Admitting: Podiatry

## 2015-04-06 ENCOUNTER — Other Ambulatory Visit: Payer: Self-pay | Admitting: Family Medicine

## 2015-04-06 VITALS — BP 132/89 | HR 69 | Temp 96.7°F | Resp 12

## 2015-04-06 DIAGNOSIS — L03116 Cellulitis of left lower limb: Secondary | ICD-10-CM | POA: Diagnosis not present

## 2015-04-06 DIAGNOSIS — R609 Edema, unspecified: Secondary | ICD-10-CM

## 2015-04-06 NOTE — Progress Notes (Signed)
   Subjective:    Patient ID: [name], female    DOB: 1922/07/20, 81 y.o.   MRN: [id number]  HPI Patient presents again for follow-up care for an edematous, erythematous dorsal left foot with initial visit on 03/16/2015. Patient has completed 14 days of cephalexin 250 mg by mouth 4 times a day with one remaining day left without a any reported side effects from the medication. She says that the left foot is less painful, however she still notices swelling in the foot. She also is wearing a surgical shoe on the left foot   Review of Systems  Musculoskeletal: Positive for joint swelling and gait problem.       Objective:   Physical Exam  Pleasant orientated 3 Vascular: Mild nonpitting edema dorsal aspect of the left foot No warmth noted in the left foot Capillary reflex immediate bilaterally  Neurological: Deferred  Dermatological: Atrophic skin without any open skin lesions bilaterally Low-grade edema dorsal aspect left foot with a peak appearance on the dorsal skin area wrinkling and the skin  Musculoskeletal: Mild palpable dorsal left midfoot without any palpable lesions  X-ray examination weightbearing left foot dated 04/06/2015  Intact bony structure without fracture and/or dislocation Inferior calcaneal spur Hammertoe second Bone density appears adequate No emphysema noted  Radiographic impression: No acute bony abnormality noted in the left foot      Assessment & Plan:   Assessment: Resolving symptoms of edema and erythema most likely associated with cellulitis There is no bone activity and serial x-rays taken 3  Plan: Today review the results examine x-ray with patient today. She will complete the remaining day of cephalexin 250 and will not refill Patient advised to purchase a knee length late grade compression hose were on left foot/leg Continue wearing surgical shoe on the left foot Minimize standing and walking  Reappoint 2 weeks

## 2015-04-06 NOTE — Patient Instructions (Signed)
Today your repeat x-ray demonstrated no bone activity similar to previous x-rays The redness has reduced in your left foot The left foot still has some low-grade swelling Complete any remaining previous prescribed antibiotics Continue wearing the surgical shoe on the left foot when standing walking  Purchase a knee length late grade closed toe compression hose to wear in your left leg/foot. Apply first thing in the morning and remove at bedtime  Reappoint 2 weeks   Diabetes and Foot Care Diabetes may cause you to have problems because of poor blood supply (circulation) to your feet and legs. This may cause the skin on your feet to become thinner, break easier, and heal more slowly. Your skin may become dry, and the skin may peel and crack. You may also have nerve damage in your legs and feet causing decreased feeling in them. You may not notice minor injuries to your feet that could lead to infections or more serious problems. Taking care of your feet is one of the most important things you can do for yourself.  HOME CARE INSTRUCTIONS  Wear shoes at all times, even in the house. Do not go barefoot. Bare feet are easily injured.  Check your feet daily for blisters, cuts, and redness. If you cannot see the bottom of your feet, use a mirror or ask someone for help.  Wash your feet with warm water (do not use hot water) and mild soap. Then pat your feet and the areas between your toes until they are completely dry. Do not soak your feet as this can dry your skin.  Apply a moisturizing lotion or petroleum jelly (that does not contain alcohol and is unscented) to the skin on your feet and to dry, brittle toenails. Do not apply lotion between your toes.  Trim your toenails straight across. Do not dig under them or around the cuticle. File the edges of your nails with an emery board or nail file.  Do not cut corns or calluses or try to remove them with medicine.  Wear clean socks or stockings  every day. Make sure they are not too tight. Do not wear knee-high stockings since they may decrease blood flow to your legs.  Wear shoes that fit properly and have enough cushioning. To break in new shoes, wear them for just a few hours a day. This prevents you from injuring your feet. Always look in your shoes before you put them on to be sure there are no objects inside.  Do not cross your legs. This may decrease the blood flow to your feet.  If you find a minor scrape, cut, or break in the skin on your feet, keep it and the skin around it clean and dry. These areas may be cleansed with mild soap and water. Do not cleanse the area with peroxide, alcohol, or iodine.  When you remove an adhesive bandage, be sure not to damage the skin around it.  If you have a wound, look at it several times a day to make sure it is healing.  Do not use heating pads or hot water bottles. They may burn your skin. If you have lost feeling in your feet or legs, you may not know it is happening until it is too late.  Make sure your health care provider performs a complete foot exam at least annually or more often if you have foot problems. Report any cuts, sores, or bruises to your health care provider immediately. SEEK MEDICAL CARE IF:  You have an injury that is not healing.  You have cuts or breaks in the skin.  You have an ingrown nail.  You notice redness on your legs or feet.  You feel burning or tingling in your legs or feet.  You have pain or cramps in your legs and feet.  Your legs or feet are numb.  Your feet always feel cold. SEEK IMMEDIATE MEDICAL CARE IF:   There is increasing redness, swelling, or pain in or around a wound.  There is a red line that goes up your leg.  Pus is coming from a wound.  You develop a fever or as directed by your health care provider.  You notice a bad smell coming from an ulcer or wound.   This information is not intended to replace advice given to  you by your health care provider. Make sure you discuss any questions you have with your health care provider.   Document Released: 04/07/2000 Document Revised: 12/11/2012 Document Reviewed: 09/17/2012 Elsevier Interactive Patient Education [employer].

## 2015-04-06 NOTE — Telephone Encounter (Signed)
Pt asked if it would be okay to use Dr. Felicie Morn compression hose, Dr. Amalia Hailey had suggested she get compression hose.  I told pt if they were 20-30 mmHg that would be fine, otherwise she could go to [facility] and they would measure and instruct her on the wearing of the hose.  Pt states she will check Dr. Felicie Morn hose but to send rx to [facility] in case.  Done.

## 2015-04-07 ENCOUNTER — Ambulatory Visit: Payer: Medicare Other | Admitting: Podiatry

## 2015-04-08 ENCOUNTER — Telehealth: Payer: Self-pay | Admitting: Family Medicine

## 2015-04-08 NOTE — Telephone Encounter (Signed)
Pt called to let you know she starting have symptoms of gout yesterday and she started taking colchicine 0.6 milgrams She got the rx  In feb  She stated it seems to be helping with her symptoms  It's not as red but still swollen doesn't hurt like it did Please advise if she needs to do anything different

## 2015-04-08 NOTE — Telephone Encounter (Signed)
That is perfect, have her take 0.6 mg BID until pain gone, have her call  If not improving.

## 2015-04-08 NOTE — Telephone Encounter (Signed)
Ms. Bradly notified as instructed by telephone.   °

## 2015-04-12 ENCOUNTER — Ambulatory Visit (INDEPENDENT_AMBULATORY_CARE_PROVIDER_SITE_OTHER): Payer: Medicare Other | Admitting: Family Medicine

## 2015-04-12 ENCOUNTER — Encounter: Payer: Self-pay | Admitting: Family Medicine

## 2015-04-12 VITALS — BP 124/74 | HR 78 | Temp 97.5°F | Wt 133.2 lb

## 2015-04-12 DIAGNOSIS — J3489 Other specified disorders of nose and nasal sinuses: Secondary | ICD-10-CM

## 2015-04-12 DIAGNOSIS — E119 Type 2 diabetes mellitus without complications: Secondary | ICD-10-CM

## 2015-04-12 MED ORDER — LORATADINE 10 MG PO TABS: 10.0000 mg | ORAL_TABLET | Freq: Every day | ORAL | Status: DC

## 2015-04-12 MED ORDER — METFORMIN HCL 500 MG PO TABS: ORAL_TABLET | ORAL | Status: DC

## 2015-04-12 NOTE — Progress Notes (Signed)
Pre visit review using our clinic review tool, if applicable. No additional management support is needed unless otherwise documented below in the visit note.  She had some GI upset- heartburn- and attributed that to the metformin.  D/w pt about a short trial of 1/2 tab a day with her to update PCP.  She agrees.    She was recently on keflex per foot clinic.   She has persistent post nasal gtt for the last year, worse recently.  Now with more rhinorrhea. She didn't feel well in general but clearly nontoxic, ie not SOB. She feels some better today.  Less rhinorrhea.  No fevers.    Meds, vitals, and allergies reviewed.   ROS: See HPI.  Otherwise, noncontributory.  nad ncat Tm w/o erythema Sinuses not ttp B Nose stuffy but o/w wnl OP wnl Neck supple, no LA rrr ctab L foot in post op shoe.

## 2015-04-12 NOTE — Patient Instructions (Addendum)
Try taking a 1/2 tab of metformin for now and update Dr. Diona Browner in about 2 weeks.   Keep using flonase and add on claritin 10mg  a day.   Use nasal saline.  Take care.  Glad to see you.

## 2015-04-13 DIAGNOSIS — J3489 Other specified disorders of nose and nasal sinuses: Secondary | ICD-10-CM | POA: Insufficient documentation

## 2015-04-13 NOTE — Assessment & Plan Note (Signed)
Doesn't appear infectious.  Keep using flonase and add on claritin 10mg  a day.  She agrees.  Update [place] as needed.

## 2015-04-13 NOTE — Assessment & Plan Note (Signed)
She had some GI upset- heartburn- and attributed that to the metformin.  D/w pt about a short trial of 1/2 tab a day with her to update PCP.  She agrees.

## 2015-04-15 ENCOUNTER — Ambulatory Visit (INDEPENDENT_AMBULATORY_CARE_PROVIDER_SITE_OTHER): Payer: Medicare Other | Admitting: Family Medicine

## 2015-04-15 ENCOUNTER — Encounter: Payer: Self-pay | Admitting: Family Medicine

## 2015-04-15 ENCOUNTER — Telehealth: Payer: Self-pay | Admitting: Family Medicine

## 2015-04-15 VITALS — BP 148/78 | HR 83 | Temp 98.4°F | Ht 63.0 in | Wt 131.5 lb

## 2015-04-15 DIAGNOSIS — J069 Acute upper respiratory infection, unspecified: Secondary | ICD-10-CM | POA: Diagnosis not present

## 2015-04-15 MED ORDER — AZITHROMYCIN 250 MG PO TABS: ORAL_TABLET | ORAL | Status: DC

## 2015-04-15 NOTE — Progress Notes (Signed)
Pre visit review using our clinic review tool, if applicable. No additional management support is needed unless otherwise documented below in the visit note. 

## 2015-04-15 NOTE — Progress Notes (Signed)
   Subjective:    Patient ID: [name], female    DOB: 1923/03/19, 81 y.o.   MRN: [id number]  Cough This is a new problem. The current episode started in the past 7 days (seen by Dr. Keturah Barre on 12/19  felt non infectious runny nose.. flonase and add on claritin 10mg  a day). The problem has been gradually worsening. The cough is productive of sputum. Associated symptoms include a fever and nasal congestion. Pertinent negatives include no chest pain, chills, ear congestion, ear pain, hemoptysis, postnasal drip, shortness of breath or wheezing. Associated symptoms comments: Low grade 99.2. The symptoms are aggravated by lying down. Risk factors: non smoker. Treatments tried: flonase and add on claritin 10mg  a day. The treatment provided no relief.      Recently being treated for cellulitis  With cephalexan. Following toenail infection. Wbc was elevagted.  Finished in 12/13.  Still swells some.  told to use compression hose.   Social History /Family History/Past Medical History reviewed and updated if needed.   Review of Systems  Constitutional: Positive for fever. Negative for chills.  HENT: Negative for ear pain and postnasal drip.   Respiratory: Positive for cough. Negative for hemoptysis, shortness of breath and wheezing.   Cardiovascular: Negative for chest pain.       Objective:   Physical Exam  Constitutional: Vital signs are normal. She appears well-developed and well-nourished. She is cooperative.  Non-toxic appearance. She does not appear ill. No distress.  HENT:  Head: Normocephalic.  Right Ear: Hearing, tympanic membrane, external ear and ear canal normal. Tympanic membrane is not erythematous, not retracted and not bulging.  Left Ear: Hearing, tympanic membrane, external ear and ear canal normal. Tympanic membrane is not erythematous, not retracted and not bulging.  Nose: Mucosal edema and rhinorrhea present. Right sinus exhibits no maxillary sinus tenderness and no frontal  sinus tenderness. Left sinus exhibits no maxillary sinus tenderness and no frontal sinus tenderness.  Mouth/Throat: Uvula is midline, oropharynx is clear and moist and mucous membranes are normal.  Eyes: Conjunctivae, EOM and lids are normal. Pupils are equal, round, and reactive to light. Lids are everted and swept, no foreign bodies found.  Neck: Trachea normal and normal range of motion. Neck supple. Carotid bruit is not present. No thyroid mass and no thyromegaly present.  Cardiovascular: Normal rate, regular rhythm, S1 normal, S2 normal, normal heart sounds, intact distal pulses and normal pulses.  Exam reveals no gallop and no friction rub.   No murmur heard. Pulmonary/Chest: Effort normal and breath sounds normal. No tachypnea. No respiratory distress. She has no decreased breath sounds. She has no wheezes. She has no rhonchi. She has no rales.  Neurological: She is alert.  Skin: Skin is warm, dry and intact. No rash noted.  Psychiatric: Her speech is normal and behavior is normal. Judgment normal. Her mood appears not anxious. Cognition and memory are normal. She does not exhibit a depressed mood.          Assessment & Plan:

## 2015-04-15 NOTE — Telephone Encounter (Signed)
Ms. [place] notified as instructed by telephone.   °

## 2015-04-15 NOTE — Assessment & Plan Note (Signed)
Likely viral source but given pt age, pt worry and holidays will give rx to fill for antibiotics if not improving with mucinex DM.

## 2015-04-15 NOTE — Telephone Encounter (Signed)
Patient was seen today and was told to take Mucinex DM.  Patient said when she took the Mucinex she felt nauseous.  Patient wants to know if she can take a Mucinex that isn't as strong.

## 2015-04-15 NOTE — Telephone Encounter (Signed)
She can take generic guaifenesin.. I belive she can get in half strength dose.. have her ask pharmacist to show her

## 2015-04-15 NOTE — Patient Instructions (Addendum)
Start mucinex DM twice daily.  Continue flonase and nasal saline spray 2 times a day.  If not feeling better in next 2-3 days.. Fill antibiotics.  If shortness of breath severe.. Go to ER.

## 2015-04-21 ENCOUNTER — Encounter: Payer: Self-pay | Admitting: Podiatry

## 2015-04-21 ENCOUNTER — Ambulatory Visit (INDEPENDENT_AMBULATORY_CARE_PROVIDER_SITE_OTHER): Payer: Medicare Other | Admitting: Podiatry

## 2015-04-21 VITALS — BP 129/76 | HR 79 | Temp 97.6°F | Resp 12

## 2015-04-21 DIAGNOSIS — B351 Tinea unguium: Secondary | ICD-10-CM | POA: Diagnosis not present

## 2015-04-21 NOTE — Patient Instructions (Signed)
wear you compression hose on the left foot on a daily basis applying in the morning and removing at bedtime until the swelling ends Wear the surgical shoe on the left foot as well until the swelling ends Return every 3 months for trimming of fungal toenails Return before if you have concern

## 2015-04-22 NOTE — Progress Notes (Signed)
Patient ID: RATEEL PLINE, female   DOB: 1922/08/16, 81 y.o.   MRN: [id number]  Subjective: This patient presents at her request a day questioning toenail debridement because she is unable to pull her compression hose over the left foot. Patient admits that she just purchased the compression hose in the last several days. Patient has a history of erythematous edematous dorsal left foot with initial presentation for this problem 03/16/2015. Patient completed 14 days of cephalexin on a 4 times a day basis without a complaint from medication. There erythema reduced, however, there was residual dorsal edema. Serial x-rays were negative. Lab was noncontributory    Objective: BP 129/75 Temperature 96.5  Orientated 3 patient presents with her daughter in the treatment room Mild nonpitting edema dorsal left foot without any erythema, warmth No open skin lesions bilaterally The toenails are brittle, deformed, hypertrophic 6-10  Assessment: Resolving probable cellulitis versus non-diagnosed arthritic inflammation dorsal left foot Residual edema dorsal left foot Diabetic Mycotic toenails 6-10  Plan: Today I recommended the patient continue wearing the compression hose on a daily basis until all the swelling reduces Also, recommended she continue wearing the surgical shoe on the left foot until the swelling is resolved Toenails 6-10 are debrided mechanically and electrically without any bleeding  Reappoint 3 months for nail debridement or sooner if patient has concern

## 2015-04-29 ENCOUNTER — Telehealth: Payer: Self-pay | Admitting: Family Medicine

## 2015-04-29 DIAGNOSIS — L821 Other seborrheic keratosis: Secondary | ICD-10-CM | POA: Diagnosis not present

## 2015-04-29 NOTE — Telephone Encounter (Signed)
Pt states that boot and stockings are helping miniminally, but pt was never given a reason on what is causing it. She states she does not want to wear the boot and pressure socks all her life.  She wants to know if she should see someone else or what she should do. cb number is [phone]

## 2015-04-29 NOTE — Telephone Encounter (Signed)
Tell the patient; I reviewed the podiatry note. He felt swelling a least in part from the infection ( cellulitis that was presents). Has redness  and pain resolved? If so then swelling with likely improve with time. If not fever, no pain in foot and no redness .Marland Kitchen Give swelling more time to heal.. 2 weeks.  If still not better.. Follow up.   If still redness, fever or pain.. Make appt to be seen with myself or podiatry.

## 2015-04-30 NOTE — Telephone Encounter (Signed)
.  left message to have patient return my call.  

## 2015-04-30 NOTE — Telephone Encounter (Signed)
Spoke with patient and advised results appt scheduled 

## 2015-05-04 ENCOUNTER — Ambulatory Visit (INDEPENDENT_AMBULATORY_CARE_PROVIDER_SITE_OTHER): Payer: Medicare Other | Admitting: Family Medicine

## 2015-05-04 ENCOUNTER — Encounter: Payer: Self-pay | Admitting: Family Medicine

## 2015-05-04 VITALS — BP 120/54 | HR 77 | Temp 97.9°F | Ht 63.0 in | Wt 134.0 lb

## 2015-05-04 DIAGNOSIS — M25511 Pain in right shoulder: Secondary | ICD-10-CM | POA: Diagnosis not present

## 2015-05-04 DIAGNOSIS — M19011 Primary osteoarthritis, right shoulder: Secondary | ICD-10-CM | POA: Insufficient documentation

## 2015-05-04 DIAGNOSIS — M7989 Other specified soft tissue disorders: Secondary | ICD-10-CM | POA: Diagnosis not present

## 2015-05-04 NOTE — Progress Notes (Signed)
Pre visit review using our clinic review tool, if applicable. No additional management support is needed unless otherwise documented below in the visit note. 

## 2015-05-04 NOTE — Assessment & Plan Note (Signed)
Most likely dur to OA, pt has severe in multipel area. Offered X-ray eval or med to try short term treatment but as only hurts with trying to raise arm above head... She does not wish to eval further.  WIll given home ROM exercsies to prevent frozen shoulder.  Pt will can use ibuprofen in limited fashion for pain as needed, reviewed risk associated with NSAIDs in elderly.

## 2015-05-04 NOTE — Assessment & Plan Note (Signed)
No longer swollen. Okay to D/C post op shoe and compression hose. Elevated feet and try to stay active.  Bony prominence likely secondary to arthritis changes, but not noted on X-ray at podiatrist and neg rheum and gout work up.

## 2015-05-04 NOTE — Patient Instructions (Addendum)
Stop compression hose, unless up on feet for prolonged time.  Keep working on exercise.  Elevate feet above you heart if able.  Okay to stop post op shoe.  Start range of motion exercise in right shoulder, stop if painful. Can use short course of ibuprofen for pain as needed. Call if pain increasing for further evaluation.

## 2015-05-04 NOTE — Progress Notes (Signed)
Subjective:    Patient ID: [name], female    DOB: [birth year], 82 y.o.   MRN: [id number]  HPI  82 year old female pt presents with continue left foot swelling .  She report she was treated in 02/2015 for cellulitis by her podiatrist. 14 days of cephalexin. Erythema resolved but there is residual dorsal swelling. Uric acid 4.8, sed rate 20 CRP neg, ANA neg, RF nml  cbc wbc elevated  X-ray examination weightbearing left foot dated  11/22, 11/30, 04/06/2015 Intact bony structure without fracture and/or dislocation Inferior calcaneal spur Hammertoe second Bone density appears adequate No emphysema noted  She followed up with him on 04/21/15 for continued foot swelling. Dr. Amalia Hailey. He told her to wear compression hose and wear surgical show until it resolved.  At that time he also debrided her nails.  No pain, Area on dorsal foot prominent, not much soft tissue swelling.   Also now with pain in right shoulder. Ongoing off and on for last 1-2 years, worse in last few months.  She cannot brush hair with left arm. Pain with abducting right .Pain in anterior shoulder. If she does not use that arm it does not hurt.  No numbness, no neck pain. No change with neck movement. Has not used med for pain.         Review of Systems  Constitutional: Negative for fever and fatigue.  HENT: Negative for ear pain.   Eyes: Negative for pain.  Respiratory: Negative for chest tightness and shortness of breath.   Cardiovascular: Negative for chest pain, palpitations and leg swelling.  Gastrointestinal: Negative for abdominal pain.  Genitourinary: Negative for dysuria.       Objective:   Physical Exam  Constitutional: Vital signs are normal. She appears well-developed and well-nourished. She is cooperative.  Non-toxic appearance. She does not appear ill. No distress.  HENT:  Head: Normocephalic.  Right Ear: Hearing, tympanic membrane, external ear and ear canal normal. Tympanic  membrane is not erythematous, not retracted and not bulging.  Left Ear: Hearing, tympanic membrane, external ear and ear canal normal. Tympanic membrane is not erythematous, not retracted and not bulging.  Nose: No mucosal edema or rhinorrhea. Right sinus exhibits no maxillary sinus tenderness and no frontal sinus tenderness. Left sinus exhibits no maxillary sinus tenderness and no frontal sinus tenderness.  Mouth/Throat: Uvula is midline, oropharynx is clear and moist and mucous membranes are normal.  Eyes: Conjunctivae, EOM and lids are normal. Pupils are equal, round, and reactive to light. Lids are everted and swept, no foreign bodies found.  Neck: Trachea normal and normal range of motion. Neck supple. Carotid bruit is not present. No thyroid mass and no thyromegaly present.  Cardiovascular: Normal rate, regular rhythm, S1 normal, S2 normal, normal heart sounds, intact distal pulses and normal pulses.  Exam reveals no gallop and no friction rub.   No murmur heard. Pulmonary/Chest: Effort normal and breath sounds normal. No tachypnea. No respiratory distress. She has no decreased breath sounds. She has no wheezes. She has no rhonchi. She has no rales.  Abdominal: Soft. Normal appearance and bowel sounds are normal. There is no tenderness.  Musculoskeletal:       Right shoulder: She exhibits decreased range of motion. She exhibits no tenderness, no bony tenderness and no deformity.  No peripheral edema, no redness in feet, area of bony prominence in left dorsal foot, non tender neg neer's and drop arm in right.  Decrease range of motion.Reubin Milan raise  right arm above 90 degrees abduction.   Neurological: She is alert.  Skin: Skin is warm, dry and intact. No rash noted.  Psychiatric: Her speech is normal and behavior is normal. Judgment and thought content normal. Her mood appears not anxious. Cognition and memory are normal. She does not exhibit a depressed mood.          Assessment &  Plan:

## 2015-05-06 ENCOUNTER — Telehealth: Payer: Self-pay | Admitting: Family Medicine

## 2015-05-06 NOTE — Telephone Encounter (Signed)
Patient Name: [name] DOB: 07-22-1922 Initial Comment caller states she feels like she is going to faint, she is shaking and scared Nurse Assessment Nurse: Carlis Abbott, RN, Estill Bamberg Date/Time (Eastern Time): 05/06/2015 2:13:50 PM Confirm and document reason for call. If symptomatic, describe symptoms. ---Caller states she felt like she was going to faint, she is shaking and scared. She feels okay now since it has passed. She is a little lightheaded now. Denies chest pain, SOB, or other sx. This happened 15 minutes ago. Has the patient traveled out of the country within the last 30 days? ---No Does the patient have any new or worsening symptoms? ---Yes Will a triage be completed? ---Yes Related visit to physician within the last 2 weeks? ---Yes Does the PT have any chronic conditions? (i.e. diabetes, asthma, etc.) ---Yes List chronic conditions. ---Diabetes and foot problems Is this a behavioral health or substance abuse call? ---No Guidelines Guideline Title Affirmed Question Affirmed Notes Dizziness - Lightheadedness [1] MODERATE dizziness (e.g., interferes with normal activities) AND [2] has NOT been evaluated by physician for this (Exception: dizziness caused by heat exposure, sudden standing, or poor fluid intake) Final Disposition User See Physician within 24 Hours Carlis Abbott, RN, Estill Bamberg Comments Appointment made with Dr. Eliezer Lofts at 9:45 am on 05/07/15 Referrals REFERRED TO PCP OFFICE Disagree/Comply: Comply

## 2015-05-06 NOTE — Telephone Encounter (Signed)
Pt has appt 05/07/15 at 9:15 with Dr Diona Browner.

## 2015-05-07 ENCOUNTER — Ambulatory Visit (INDEPENDENT_AMBULATORY_CARE_PROVIDER_SITE_OTHER): Payer: Medicare Other | Admitting: Family Medicine

## 2015-05-07 ENCOUNTER — Ambulatory Visit: Payer: Self-pay | Admitting: Family Medicine

## 2015-05-07 ENCOUNTER — Encounter: Payer: Self-pay | Admitting: Family Medicine

## 2015-05-07 VITALS — BP 114/63 | HR 81 | Temp 97.7°F | Ht 63.0 in | Wt 133.5 lb

## 2015-05-07 DIAGNOSIS — R55 Syncope and collapse: Secondary | ICD-10-CM | POA: Insufficient documentation

## 2015-05-07 NOTE — Progress Notes (Signed)
Pre visit review using our clinic review tool, if applicable. No additional management support is needed unless otherwise documented below in the visit note. 

## 2015-05-07 NOTE — Assessment & Plan Note (Signed)
She has had extensive work up in past. Only clear cause appears to be dehydration age and possibly caffeine and or stress/panxiety.  INCrease fluids, avoid caffeine, stress reduction and relaxation.

## 2015-05-07 NOTE — Patient Instructions (Addendum)
Avoid caffeine, increase water. Stand up slowly.  Work on stress reduction, relaxation. If symptoms return .Marland Kitchen Call for a neurology referral.

## 2015-05-07 NOTE — Progress Notes (Signed)
Subjective:    Patient ID: [name], female    DOB: 01-23-1923, 82 y.o.   MRN: [id number]  HPI   82 year old female presents for  Intermittent dizziness. She is here with her daughter.   She reports she had an episode of  presyncope  She was sitting an watching TV, 1 hour before had eatedn lunch. HAd plyed bridge that day at church. She had taken nexium before eating. Feels sensation in abdomen that goes up to head. Last 2-3 seconds. Then resolves on its own. She has trouble describing it. Feels like may pass out. Kind of like blackness coming in. No CP, no SOB.no palpitations.  Afterward felt weak for 1 hour, then now back to normal self.  CBG 158  No proceeding anxiety. Son does have pancreatic cancer. Only other thing is she has not been drinking as much water as usual. She also had caffeine prior.  BP Readings from Last 3 Encounters:  05/07/15 114/63  05/04/15 120/54  04/21/15 129/76   She has history of episode similar last year 11/2013. But has not had  Since.  Last work up [employer],  CMET, uculture,  TSH all normal. 03/2013 ER visit: presyncope with stess test nml. She felt that was different. Likely due to due  2013  Saw Dr. Verl Blalock for similar presyncope, neg eval.  2013 mild changes on carotid stenosis. 2010 MRI /MRA : IMPRESSION: No acute infarct. No significant carotid or vertebral stenosis.  Review of Systems  Constitutional: Negative for fever and fatigue.  HENT: Negative for ear pain.   Eyes: Negative for pain.  Respiratory: Negative for chest tightness and shortness of breath.   Cardiovascular: Negative for chest pain, palpitations and leg swelling.  Gastrointestinal: Negative for abdominal pain.  Genitourinary: Negative for dysuria.       Objective:   Physical Exam  Constitutional: She is oriented to person, place, and time. Vital signs are normal. She appears well-developed and well-nourished. She is cooperative.  Non-toxic appearance. She does  not appear ill. No distress.  HENT:  Head: Normocephalic.  Right Ear: Hearing, tympanic membrane, external ear and ear canal normal. Tympanic membrane is not erythematous, not retracted and not bulging.  Left Ear: Hearing, tympanic membrane, external ear and ear canal normal. Tympanic membrane is not erythematous, not retracted and not bulging.  Nose: No mucosal edema or rhinorrhea. Right sinus exhibits no maxillary sinus tenderness and no frontal sinus tenderness. Left sinus exhibits no maxillary sinus tenderness and no frontal sinus tenderness.  Mouth/Throat: Uvula is midline, oropharynx is clear and moist and mucous membranes are normal.  Eyes: Conjunctivae, EOM and lids are normal. Pupils are equal, round, and reactive to light. Lids are everted and swept, no foreign bodies found.  Neck: Trachea normal and normal range of motion. Neck supple. Carotid bruit is not present. No thyroid mass and no thyromegaly present.  Cardiovascular: Normal rate, regular rhythm, S1 normal, S2 normal, normal heart sounds, intact distal pulses and normal pulses.  Exam reveals no gallop and no friction rub.   No murmur heard. Pulmonary/Chest: Effort normal and breath sounds normal. No tachypnea. No respiratory distress. She has no decreased breath sounds. She has no wheezes. She has no rhonchi. She has no rales.  Abdominal: Soft. Normal appearance and bowel sounds are normal. There is no tenderness.  Neurological: She is alert and oriented to person, place, and time. She has normal strength and normal reflexes. No cranial nerve deficit or sensory deficit.  She exhibits normal muscle tone. She displays a negative Romberg sign. Coordination and gait normal. GCS eye subscore is 4. GCS verbal subscore is 5. GCS motor subscore is 6.  Nml cerebellar exam   No papilledema  Skin: Skin is warm, dry and intact. No rash noted.  Psychiatric: She has a normal mood and affect. Her speech is normal and behavior is normal. Judgment  and thought content normal. Her mood appears not anxious. Cognition and memory are normal. Cognition and memory are not impaired. She does not exhibit a depressed mood. She exhibits normal recent memory and normal remote memory.          Assessment & Plan:

## 2015-06-08 ENCOUNTER — Ambulatory Visit (INDEPENDENT_AMBULATORY_CARE_PROVIDER_SITE_OTHER): Payer: Medicare Other | Admitting: Primary Care

## 2015-06-08 ENCOUNTER — Encounter: Payer: Self-pay | Admitting: Primary Care

## 2015-06-08 ENCOUNTER — Other Ambulatory Visit: Payer: Self-pay | Admitting: Primary Care

## 2015-06-08 VITALS — BP 124/74 | HR 71 | Temp 97.4°F | Ht 63.0 in | Wt 134.8 lb

## 2015-06-08 DIAGNOSIS — M7989 Other specified soft tissue disorders: Secondary | ICD-10-CM

## 2015-06-08 DIAGNOSIS — M79672 Pain in left foot: Secondary | ICD-10-CM

## 2015-06-08 DIAGNOSIS — L03116 Cellulitis of left lower limb: Secondary | ICD-10-CM

## 2015-06-08 LAB — URIC ACID: URIC ACID, SERUM: 5.2 mg/dL (ref 2.4–7.0)

## 2015-06-08 LAB — CBC
HEMATOCRIT: 40 % (ref 36.0–46.0)
HEMOGLOBIN: 13.4 g/dL (ref 12.0–15.0)
MCHC: 33.5 g/dL (ref 30.0–36.0)
MCV: 91.5 fl (ref 78.0–100.0)
PLATELETS: 259 10*3/uL (ref 150.0–400.0)
RBC: 4.37 Mil/uL (ref 3.87–5.11)
RDW: 14.8 % (ref 11.5–15.5)
WBC: 13.6 10*3/uL — AB (ref 4.0–10.5)

## 2015-06-08 LAB — BASIC METABOLIC PANEL
BUN: 24 mg/dL — ABNORMAL HIGH (ref 6–23)
CHLORIDE: 104 meq/L (ref 96–112)
CO2: 28 meq/L (ref 19–32)
Calcium: 10.4 mg/dL (ref 8.4–10.5)
Creatinine, Ser: 1.04 mg/dL (ref 0.40–1.20)
GFR: 52.86 mL/min — ABNORMAL LOW (ref >60.00)
Glucose, Bld: 94 mg/dL (ref 70–99)
POTASSIUM: 4.1 meq/L (ref 3.5–5.1)
SODIUM: 139 meq/L (ref 135–145)

## 2015-06-08 MED ORDER — DOXYCYCLINE HYCLATE 100 MG PO TABS: 100.0000 mg | ORAL_TABLET | Freq: Two times a day (BID) | ORAL | Status: DC

## 2015-06-08 NOTE — Assessment & Plan Note (Signed)
Mild swelling and erythema. 2+ DP and PT pulses. Good ROM. Suspect discomfort could be due to long term wear of post op shoe and compression hose. Will have her stop wearing boot.   Noted negative gout and rheum work up last year. Will re-test for gout as she's describing joint aches, which could also be arthritis. Do not believe she requires vascular or neuro consult at this time.  Ice, rest, elevation, iburpofen sparingly.  Follow up appointment with PCP due in March, she will call sooner if problem persists.

## 2015-06-08 NOTE — Progress Notes (Signed)
Subjective:    Patient ID: [name], female    DOB: Dec 02, 1922, 82 y.o.   MRN: [id number]  HPI  [name] is a 82 year old female who presents today with a chief complaint of foot pain. Her pain is located to the left foot and has been present for the past 3 months. She was evaluated by podiatry in late December 2016 and again by PCP in mid January 2017. She had a negative gout and rheumatology work up in November 2016.   Over the past 2 days she's noticed an increase in pain. She's continued to wear the boot provided to her by podiatry and compression hose. She cannot describe her pain, but denies it as sharp and burning, and is intermittent. Her pain is present to the dorsal aspect of her left foot and is worse with ambulation. She also reports erythema and swelling. Denies recent injury or trauma, fevers, fatigue.   Review of Systems  Constitutional: Negative for fever and chills.  Musculoskeletal: Positive for arthralgias.       Foot pain  Skin: Positive for color change.       Past Medical History  Diagnosis Date  . Hyperlipidemia   . Hypertension   . Diabetes mellitus     Type II  . GERD (gastroesophageal reflux disease)   . Allergy   . Syncope and collapse     1 episode  . Orthostatic hypotension 04/2006    [facility]ospital  . Palpitations   . Acute pharyngitis   . Other abscess of vulva   . Acute sinusitis, unspecified   . Acute upper respiratory infections of unspecified site   . Hemangioma of unspecified site   . Anal and rectal polyp   . Cataract     History of  . Osteopenia   . Osteoarthritis   . Gout   . Cavernous angioma   . Degenerative cervical disc 02/2006    Social History   Social History  . Marital Status: Married    Spouse Name: N/A  . Number of Children: 3  . Years of Education: N/A   Occupational History  . 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 Smoking status: Former Research scientist (life sciences)  .  Smokeless tobacco: Never Used     Comment: quit 1970  . Alcohol Use: No  . Drug Use: No  . Sexual Activity: Not on file   Other Topics Concern  . Not on file   Social History Narrative   Diet: fruit and veggies, water, lean protein.   Activity limited secondary to knee discomfort/arthritis, but going to church exersice group two times a week.                 Past Surgical History  Procedure Laterality Date  . Abdominal hysterectomy    . Cataract extraction    . Cavernous hemangioma  04/2004    Hospital  . Cavernous angioma    . Aortic dopplers      Mild plaque.  EEG okay  . Total knee arthroplasty      Right    Family History  Problem Relation Age of Onset  . Cancer Maternal Aunt     Breast CA  . Heart disease Other     CHF    Allergies  Allergen Reactions  . Atorvastatin     REACTION: intolerant  . Codeine     REACTION: GI upset  . Rosuvastatin  REACTION: intolerant    Current Outpatient Prescriptions on File Prior to Visit  Medication Sig Dispense Refill  . aspirin EC 81 MG tablet Take 40.5 mg by mouth at bedtime.     . Blood Glucose Monitoring Suppl (ACCU-CHEK AVIVA PLUS) W/DEVICE KIT Use to check blood sugar daily as needed.  Dx: E11.9 1 kit 0  . Calcium Citrate (CITRACAL PO) Take 1 capsule by mouth daily.     Marland Kitchen esomeprazole (NEXIUM) 40 MG capsule Take 1 capsule (40 mg total) by mouth daily at 12 noon. 30 capsule 2  . fish oil-omega-3 fatty acids 1000 MG capsule Take 1 g by mouth daily.      . fluticasone (FLONASE) 50 MCG/ACT nasal spray PLACE 2 SPRAYS INTO BOTH NOSTRILS DAILY. 16 g 11  . glucose blood (ACCU-CHEK AVIVA PLUS) test strip Use to check blood sugar daily as needed.  Dx: E11.9 100 each 3  . Lancets (FREESTYLE) lancets Use to check blood sugar once daily.  Dx: E11.9 100 each 3  . metFORMIN (GLUCOPHAGE) 500 MG tablet TAKE 1/2 TABLET (250 MG TOTAL) BY MOUTH DAILY WITH BREAKFAST.    . Multiple Vitamin (MULTIVITAMIN) tablet Take 1 tablet by  mouth.    Vladimir Faster Glycol-Propyl Glycol (SYSTANE OP) Apply 1 drop to eye daily as needed (for dry eyes).    . ramipril (ALTACE) 5 MG capsule TAKE 1 CAPSULE (5 MG TOTAL) BY MOUTH DAILY. 90 capsule 1  . simvastatin (ZOCOR) 40 MG tablet TAKE 1 TABLET (40 MG TOTAL) BY MOUTH AT BEDTIME. 90 tablet 2   No current facility-administered medications on file prior to visit.    BP 124/74 mmHg  Pulse 71  Temp(Src) 97.4 F (36.3 C) (Oral)  Ht 5' 3"  (1.6 m)  Wt 134 lb 12.8 oz (61.145 kg)  BMI 23.88 kg/m2  SpO2 96%    Objective:   Physical Exam  Constitutional: She appears well-nourished.  Cardiovascular: Normal rate and regular rhythm.   Pulmonary/Chest: Effort normal and breath sounds normal.  Musculoskeletal:       Left ankle: She exhibits swelling. She exhibits normal range of motion, no ecchymosis and normal pulse. No tenderness.  Skin: Skin is warm and dry.  Mild erythema and swelling to left dorsal foot. Small bony protrusion that appears chronic.          Assessment & Plan:

## 2015-06-08 NOTE — Patient Instructions (Addendum)
For pain and inflammation, try ibuprofen 600 mg three times daily.   Elevate your foot when resting. You may also apply ice for comfort.   Stop wearing the boot if possible.  Please call me if no reduction in swelling and discomfort with these measures above.   Complete lab work prior to leaving today. I will notify you of your results once received.   It was a pleasure to see you today!

## 2015-06-08 NOTE — Progress Notes (Signed)
Pre visit review using our clinic review tool, if applicable. No additional management support is needed unless otherwise documented below in the visit note. 

## 2015-06-10 ENCOUNTER — Telehealth: Payer: Self-pay

## 2015-06-10 NOTE — Telephone Encounter (Signed)
Pt left v/m; pt was seen 06/08/15 and pt wants to know how long can pt take the ibuprofen 600 mg tid for foot swelling and redness. Pt request cb. The foot is still swollen.

## 2015-06-10 NOTE — Telephone Encounter (Signed)
[name], she did pick up antibiotics. Called and notified patient of [name]'s comments. Patient verbalized understanding.

## 2015-06-10 NOTE — Telephone Encounter (Signed)
Did she get the antibiotics that were sent to her pharmacy? She may take the ibuprofen for 3-4 more days.

## 2015-06-16 ENCOUNTER — Telehealth: Payer: Self-pay | Admitting: Primary Care

## 2015-06-16 DIAGNOSIS — L03119 Cellulitis of unspecified part of limb: Secondary | ICD-10-CM

## 2015-06-16 MED ORDER — SULFAMETHOXAZOLE-TRIMETHOPRIM 800-160 MG PO TABS: 1.0000 | ORAL_TABLET | Freq: Two times a day (BID) | ORAL | Status: DC

## 2015-06-16 NOTE — Telephone Encounter (Signed)
Patient returned Chan's call. °

## 2015-06-16 NOTE — Telephone Encounter (Signed)
Called patient. Patient stated that she took started the antibiotic Wednesday night and continue to take it until Monday afternoon. She had a bowel movement last night but not today yet. Stomach pain comes and go. Stomach hurts and looks extended. Mouth taste sour since yesterday. No diarrhea. Foot have improved but still look pinkish. Patient wanted to know if she should finish the antibiotics.

## 2015-06-16 NOTE — Telephone Encounter (Signed)
Called and notified patient of [name]'s comments. Patient verbalized understanding.  

## 2015-06-16 NOTE — Telephone Encounter (Signed)
How many days of the antibiotic has she taken? Is she having bowel movements? When was the last one?  Has her foot improved?

## 2015-06-16 NOTE — Telephone Encounter (Signed)
Message left for patient to return my call.  

## 2015-06-16 NOTE — Telephone Encounter (Signed)
Patient was put on an antibiotic last week.  Patient said she her stomach swells up and her stomach is uncomfortable.  Patient said she tried taking the antibiotic with food, but her stomach is still swollen. Patient wants to know if she should continue taking the antibiotic. Patient will be out of the house until 1:00.

## 2015-06-16 NOTE — Telephone Encounter (Signed)
Due to GI upset and distention will stop Doxycycline and switch to Bactrim DS. She will take 1 tablet by mouth twice daily for 7 days. She is to follow up with Dr. Diona Browner on 06/21/15 as scheduled. Please have her call us if no improvement or her foot starts to look worse.

## 2015-06-17 ENCOUNTER — Telehealth: Payer: Self-pay | Admitting: Internal Medicine

## 2015-06-17 NOTE — Telephone Encounter (Signed)
Patient Name: [name]  DOB: Sep 16, 1922    Initial Comment Caller states saw np last week for foot swelling, gave her abx and ibuprofen, meds hurting her stomach   Nurse Assessment  Nurse: Marcelline Deist, RN, Lynda Date/Time (Eastern Time): 06/17/2015 10:42:03 AM  Confirm and document reason for call. If symptomatic, describe symptoms. You must click the next button to save text entered. ---Caller states saw NP last week for left foot swelling, gave her Doxycycline and Ibuprofen. Thought she might have an infection after having blood drawn. The meds are hurting her stomach. Stopped taking the Ibuprofen. Spoke with nurse yesterday, told NP that rx was bothering her. Switched to a different rx Sulfamethoxazole twice a day from [facility] but her stomach was still bothering her. Her foot is pink & not as swollen as it was. Tested for gout, not that. It doesn't hurt right now. Stomach is somewhat distended, hurting on both sides, more on left side where the stomach is. Took Pepto Bismal, drinking water. No fever.  Has the patient traveled out of the country within the last 30 days? ---Not Applicable  Does the patient have any new or worsening symptoms? ---Yes  Will a triage be completed? ---Yes  Related visit to physician within the last 2 weeks? ---Yes  Does the PT have any chronic conditions? (i.e. diabetes, asthma, etc.) ---Yes  List chronic conditions. ---diabetes, on BP rx  Is this a behavioral health or substance abuse call? ---No     Guidelines    Guideline Title Affirmed Question Affirmed Notes  Abdominal Pain - Upper [1] Pain lasts > 10 minutes AND [2] age > 59    Final Disposition User   Go to ED Now Marcelline Deist, RN, Kermit Balo    Comments  Caller states her stomach discomfort/pain is constant, on left side above belly button area. Rates it a 3 on pain scale. Nurse will follow-up with office about further advice as caller does not feel she needs to be seen in the ER. Side effects of both  antibiotics referenced in [facility] and both can have abdominal pain as side effects.  Spoke with Shirlean Mylar from the office and made aware of triage outcome. The nurse will give her a call back this morning.   Referrals  REFERRED TO PCP OFFICE   Disagree/Comply: Disagree  Disagree/Comply Reason: Disagree with instructions

## 2015-06-17 NOTE — Telephone Encounter (Signed)
Patient called and asked [name] to return her call at [phone].

## 2015-06-17 NOTE — Telephone Encounter (Signed)
Called patient and still having stomach pain so will be follow up Dr Diona Browner on Friday 06/18/2015

## 2015-06-17 NOTE — Telephone Encounter (Signed)
I'm confused. Please schedule patient with PCP tomorrow. Thanks.

## 2015-06-18 ENCOUNTER — Encounter: Payer: Self-pay | Admitting: Family Medicine

## 2015-06-18 ENCOUNTER — Ambulatory Visit (INDEPENDENT_AMBULATORY_CARE_PROVIDER_SITE_OTHER): Payer: Medicare Other | Admitting: Family Medicine

## 2015-06-18 VITALS — BP 128/74 | HR 72 | Temp 97.5°F | Ht 63.0 in | Wt 131.5 lb

## 2015-06-18 DIAGNOSIS — R140 Abdominal distension (gaseous): Secondary | ICD-10-CM | POA: Diagnosis not present

## 2015-06-18 DIAGNOSIS — L03116 Cellulitis of left lower limb: Secondary | ICD-10-CM | POA: Insufficient documentation

## 2015-06-18 DIAGNOSIS — M7989 Other specified soft tissue disorders: Secondary | ICD-10-CM

## 2015-06-18 LAB — CBC WITH DIFFERENTIAL/PLATELET
BASOS PCT: 0.1 % (ref 0.0–3.0)
Basophils Absolute: 0 10*3/uL (ref 0.0–0.1)
EOS PCT: 1.2 % (ref 0.0–5.0)
Eosinophils Absolute: 0.1 10*3/uL (ref 0.0–0.7)
HCT: 40.3 % (ref 36.0–46.0)
Hemoglobin: 13.5 g/dL (ref 12.0–15.0)
LYMPHS ABS: 2.2 10*3/uL (ref 0.7–4.0)
Lymphocytes Relative: 20 % (ref 12.0–46.0)
MCHC: 33.5 g/dL (ref 30.0–36.0)
MCV: 92.3 fl (ref 78.0–100.0)
MONO ABS: 0.8 10*3/uL (ref 0.1–1.0)
Monocytes Relative: 7.5 % (ref 3.0–12.0)
NEUTROS PCT: 71.2 % (ref 43.0–77.0)
Neutro Abs: 7.8 10*3/uL — ABNORMAL HIGH (ref 1.4–7.7)
Platelets: 275 10*3/uL (ref 150.0–400.0)
RBC: 4.36 Mil/uL (ref 3.87–5.11)
RDW: 14.5 % (ref 11.5–15.5)
WBC: 10.9 10*3/uL — ABNORMAL HIGH (ref 4.0–10.5)

## 2015-06-18 NOTE — Patient Instructions (Addendum)
Stop at lab on way out.  Can start probiotic over the counter to help stomach get back to usual.  Continue nexium.  Call if foot pain returns.

## 2015-06-18 NOTE — Assessment & Plan Note (Addendum)
Treated with almost 7 days doxy, will re-check wbc for improvement.

## 2015-06-18 NOTE — Assessment & Plan Note (Signed)
Resolved

## 2015-06-18 NOTE — Progress Notes (Signed)
   Subjective:    Patient ID: [name], female    DOB: 1923-03-20, 82 y.o.   MRN: [id number]  HPI   82 year old female presents for follow up foot pain.  She has been having ongoing issues with her left foot in last 3 months. She was evaluated by podiatry in late December 2016 and again by myself in mid January 2017. She had a negative gout and rheumatology work up in November 2016.  Several neg X-rays in 11 and 03/2015  She was treated for cellulitis of foot in 02/2015.   Last OV with Allie Bossier on 2/14.. Felt pain could be from prolonged wear of post op shoe and compression hose. Recommended ice, elevation and NSAIDs sparingly.  uric acid nml. Wbc elevated some so given doxcycline for cellulitis.    Today she reports: She had some stomach upset with ibuprofen and doxycycline. She stopped this 2 days ago She reports that  The pain is gone at this point. She is still wearing compression hose.  She did not change to a different med.  Social History /Family History/Past Medical History reviewed and updated if needed.  Review of Systems  Constitutional: Negative for fever and fatigue.  HENT: Negative for ear pain.   Eyes: Negative for pain.  Respiratory: Negative for chest tightness and shortness of breath.   Cardiovascular: Negative for chest pain, palpitations and leg swelling.  Gastrointestinal: Positive for abdominal pain and abdominal distention. Negative for vomiting, diarrhea, constipation, blood in stool and rectal pain.  Genitourinary: Negative for dysuria.       Objective:   Physical Exam  Constitutional: Vital signs are normal. She appears well-developed and well-nourished. She is cooperative.  Non-toxic appearance. She does not appear ill. No distress.  HENT:  Head: Normocephalic.  Right Ear: Hearing, tympanic membrane, external ear and ear canal normal. Tympanic membrane is not erythematous, not retracted and not bulging.  Left Ear: Hearing, tympanic membrane,  external ear and ear canal normal. Tympanic membrane is not erythematous, not retracted and not bulging.  Nose: No mucosal edema or rhinorrhea. Right sinus exhibits no maxillary sinus tenderness and no frontal sinus tenderness. Left sinus exhibits no maxillary sinus tenderness and no frontal sinus tenderness.  Mouth/Throat: Uvula is midline, oropharynx is clear and moist and mucous membranes are normal.  Eyes: Conjunctivae, EOM and lids are normal. Pupils are equal, round, and reactive to light. Lids are everted and swept, no foreign bodies found.  Neck: Trachea normal and normal range of motion. Neck supple. Carotid bruit is not present. No thyroid mass and no thyromegaly present.  Cardiovascular: Normal rate, regular rhythm, S1 normal, S2 normal, normal heart sounds, intact distal pulses and normal pulses.  Exam reveals no gallop and no friction rub.   No murmur heard. Pulmonary/Chest: Effort normal and breath sounds normal. No tachypnea. No respiratory distress. She has no decreased breath sounds. She has no wheezes. She has no rhonchi. She has no rales.  Abdominal: Soft. Normal appearance and bowel sounds are normal. There is generalized tenderness.  Mild sorness, but bloated  Neurological: She is alert.  Skin: Skin is warm, dry and intact. No rash noted.  Psychiatric: Her speech is normal and behavior is normal. Judgment and thought content normal. Her mood appears not anxious. Cognition and memory are normal. She does not exhibit a depressed mood.          Assessment & Plan:

## 2015-06-18 NOTE — Assessment & Plan Note (Signed)
Likely due to ibuprofen and antibiotics.  Continue nexium and start probiotic.

## 2015-06-18 NOTE — Progress Notes (Signed)
Pre visit review using our clinic review tool, if applicable. No additional management support is needed unless otherwise documented below in the visit note. 

## 2015-06-19 ENCOUNTER — Other Ambulatory Visit: Payer: Self-pay | Admitting: Family Medicine

## 2015-06-21 ENCOUNTER — Ambulatory Visit: Payer: Medicare Other | Admitting: Family Medicine

## 2015-07-13 ENCOUNTER — Other Ambulatory Visit (INDEPENDENT_AMBULATORY_CARE_PROVIDER_SITE_OTHER): Payer: Medicare Other

## 2015-07-13 ENCOUNTER — Telehealth: Payer: Self-pay | Admitting: Family Medicine

## 2015-07-13 DIAGNOSIS — E119 Type 2 diabetes mellitus without complications: Secondary | ICD-10-CM

## 2015-07-13 LAB — LIPID PANEL
CHOLESTEROL: 165 mg/dL (ref 0–200)
HDL: 54 mg/dL (ref >39.00)
LDL Cholesterol: 98 mg/dL (ref 0–99)
NonHDL: 111.38
Total CHOL/HDL Ratio: 3
Triglycerides: 65 mg/dL (ref 0.0–149.0)
VLDL: 13 mg/dL (ref 0.0–40.0)

## 2015-07-13 LAB — HEMOGLOBIN A1C: Hgb A1c MFr Bld: 6.4 % (ref 4.6–6.5)

## 2015-07-13 LAB — COMPREHENSIVE METABOLIC PANEL
ALBUMIN: 3.9 g/dL (ref 3.5–5.2)
ALT: 16 U/L (ref 0–35)
AST: 20 U/L (ref 0–37)
Alkaline Phosphatase: 41 U/L (ref 39–117)
BILIRUBIN TOTAL: 0.5 mg/dL (ref 0.2–1.2)
BUN: 20 mg/dL (ref 6–23)
CALCIUM: 9.6 mg/dL (ref 8.4–10.5)
CHLORIDE: 109 meq/L (ref 96–112)
CO2: 29 mEq/L (ref 19–32)
CREATININE: 1.06 mg/dL (ref 0.40–1.20)
GFR: 51.7 mL/min — ABNORMAL LOW (ref >60.00)
Glucose, Bld: 118 mg/dL — ABNORMAL HIGH (ref 70–99)
Potassium: 4.5 mEq/L (ref 3.5–5.1)
SODIUM: 142 meq/L (ref 135–145)
TOTAL PROTEIN: 7.1 g/dL (ref 6.0–8.3)

## 2015-07-13 NOTE — Telephone Encounter (Signed)
-----   Message from Marchia Bond sent at 07/05/2015  1:53 PM EDT ----- Regarding: Dm f/u labs Tues 3/21, need orders. Thanks! :-) Please order future dm f/u labs for pt's upcoming lab appt. Thanks Aniceto Boss

## 2015-07-15 ENCOUNTER — Encounter: Payer: Self-pay | Admitting: Family Medicine

## 2015-07-15 ENCOUNTER — Ambulatory Visit (INDEPENDENT_AMBULATORY_CARE_PROVIDER_SITE_OTHER): Payer: Medicare Other | Admitting: Family Medicine

## 2015-07-15 VITALS — BP 134/81 | HR 70 | Temp 97.8°F | Ht 63.0 in | Wt 134.8 lb

## 2015-07-15 DIAGNOSIS — K219 Gastro-esophageal reflux disease without esophagitis: Secondary | ICD-10-CM | POA: Diagnosis not present

## 2015-07-15 DIAGNOSIS — E785 Hyperlipidemia, unspecified: Secondary | ICD-10-CM | POA: Diagnosis not present

## 2015-07-15 DIAGNOSIS — I10 Essential (primary) hypertension: Secondary | ICD-10-CM | POA: Diagnosis not present

## 2015-07-15 DIAGNOSIS — E119 Type 2 diabetes mellitus without complications: Secondary | ICD-10-CM

## 2015-07-15 LAB — HM DIABETES FOOT EXAM

## 2015-07-15 NOTE — Patient Instructions (Signed)
Continue current medications.  Let me know if you decide to stop cholesterol medication.

## 2015-07-15 NOTE — Progress Notes (Signed)
Pre visit review using our clinic review tool, if applicable. No additional management support is needed unless otherwise documented below in the visit note. 

## 2015-07-15 NOTE — Progress Notes (Signed)
Subjective:    Patient ID: [name], female    DOB: 22-Aug-1922, 82 y.o.   MRN: [id number]  HPI  82 year old female presents for DM follow up.   Diabetes:   Well controlled on 1/2 metformin Lab Results  Component Value Date   HGBA1C 6.4 07/13/2015  Using medications without difficulties: Hypoglycemic episodes:none Hyperglycemic episodes:none Feet problems:none Blood Sugars averaging:CBG 105-110 eye exam within last year: 08/2014  GERD well controlled on  Nexium.  Osteoporosis on Prolia.   Reviewed labs in detail with pt.  Cholesterol at goal on simvastatin 40 mg daily. LDL is at goal < 100 tolerating it well.  Lab Results  Component Value Date   CHOL 165 07/13/2015   HDL 54.00 07/13/2015   LDLCALC 98 07/13/2015   TRIG 65.0 07/13/2015   CHOLHDL 3 07/13/2015    Social History /Family History/Past Medical History reviewed and updated if needed.  Review of Systems  Constitutional: Negative for fever and fatigue.  HENT: Negative for ear pain.   Eyes: Negative for pain.  Respiratory: Negative for chest tightness and shortness of breath.   Cardiovascular: Negative for chest pain, palpitations and leg swelling.  Gastrointestinal: Negative for abdominal pain.  Genitourinary: Negative for dysuria.       Objective:   Physical Exam  Constitutional: She is oriented to person, place, and time. Vital signs are normal. She appears well-developed and well-nourished. She is cooperative.  Non-toxic appearance. She does not appear ill. No distress.  Elderly female In NAD  HENT:  Head: Normocephalic.  Right Ear: Hearing, tympanic membrane, external ear and ear canal normal. Tympanic membrane is not erythematous, not retracted and not bulging.  Left Ear: Hearing, tympanic membrane, external ear and ear canal normal. Tympanic membrane is not erythematous, not retracted and not bulging.  Nose: No mucosal edema or rhinorrhea. Right sinus exhibits no maxillary sinus tenderness  and no frontal sinus tenderness. Left sinus exhibits no maxillary sinus tenderness and no frontal sinus tenderness.  Mouth/Throat: Uvula is midline, oropharynx is clear and moist and mucous membranes are normal.  Eyes: Conjunctivae, EOM and lids are normal. Pupils are equal, round, and reactive to light. Lids are everted and swept, no foreign bodies found.  Neck: Trachea normal and normal range of motion. Neck supple. Carotid bruit is not present. No thyroid mass and no thyromegaly present.  Cardiovascular: Normal rate, regular rhythm, S1 normal, S2 normal, normal heart sounds, intact distal pulses and normal pulses.  Exam reveals no gallop and no friction rub.   No murmur heard. Pulmonary/Chest: Effort normal and breath sounds normal. No tachypnea. No respiratory distress. She has no decreased breath sounds. She has no wheezes. She has no rhonchi. She has no rales.  Abdominal: Soft. Normal appearance and bowel sounds are normal. There is no tenderness.  Neurological: She is alert and oriented to person, place, and time. She has normal strength and normal reflexes. No cranial nerve deficit or sensory deficit. She exhibits normal muscle tone. She displays a negative Romberg sign. Coordination and gait normal. GCS eye subscore is 4. GCS verbal subscore is 5. GCS motor subscore is 6.  Nml cerebellar exam   No papilledema  Skin: Skin is warm, dry and intact. No rash noted.  Psychiatric: She has a normal mood and affect. Her speech is normal and behavior is normal. Judgment and thought content normal. Her mood appears not anxious. Cognition and memory are normal. Cognition and memory are not impaired. She does not exhibit  a depressed mood. She exhibits normal recent memory and normal remote memory.      Diabetic foot exam: Normal inspection No skin breakdown No calluses  Normal DP pulses Normal sensation to light touch and monofilament Nails normal  Assessment & Plan:

## 2015-07-22 ENCOUNTER — Other Ambulatory Visit: Payer: Self-pay | Admitting: Family Medicine

## 2015-07-27 ENCOUNTER — Ambulatory Visit (INDEPENDENT_AMBULATORY_CARE_PROVIDER_SITE_OTHER): Payer: Medicare Other | Admitting: Podiatry

## 2015-07-27 ENCOUNTER — Encounter: Payer: Self-pay | Admitting: Podiatry

## 2015-07-27 DIAGNOSIS — B351 Tinea unguium: Secondary | ICD-10-CM

## 2015-07-27 DIAGNOSIS — M79676 Pain in unspecified toe(s): Secondary | ICD-10-CM

## 2015-07-27 NOTE — Progress Notes (Signed)
Patient ID: [name], female   DOB: 06/07/1922, 82 y.o.   MRN: [id number]  Subjective: This patient presents today for scheduled visit complaining of thickened and elongated toenails are comfortable walking wearing shoes and requests toenail debridement. Patient has a history of cellulitis that we have treated with oral antibiotics. Patient describes visit to primary care physician for reoccurring swelling in the dorsal left foot which was diagnosed as cellulitis and was treated with doxycycline. The symptoms have resolved from that treatment  Objective Orientated 3 Mild nonpitting edema dorsal left foot without any erythema, warmth No open skin lesions bilaterally The toenails are brittle, deformed, hypertrophic and tender to palpation 6-10  Assessment: No clinical sign of infection dorsal on the dorsal aspect left foot Residual edema dorsal left foot Diabetic Symptomatic Mycotic toenails 6-10   Plan: Debridement of toenails 6-10 mechanically and electronically without any bleeding  Reappoint 3 months

## 2015-07-27 NOTE — Patient Instructions (Signed)
Diabetes and Foot Care Diabetes may cause you to have problems because of poor blood supply (circulation) to your feet and legs. This may cause the skin on your feet to become thinner, break easier, and heal more slowly. Your skin may become dry, and the skin may peel and crack. You may also have nerve damage in your legs and feet causing decreased feeling in them. You may not notice minor injuries to your feet that could lead to infections or more serious problems. Taking care of your feet is one of the most important things you can do for yourself.  HOME CARE INSTRUCTIONS  Wear shoes at all times, even in the house. Do not go barefoot. Bare feet are easily injured.  Check your feet daily for blisters, cuts, and redness. If you cannot see the bottom of your feet, use a mirror or ask someone for help.  Wash your feet with warm water (do not use hot water) and mild soap. Then pat your feet and the areas between your toes until they are completely dry. Do not soak your feet as this can dry your skin.  Apply a moisturizing lotion or petroleum jelly (that does not contain alcohol and is unscented) to the skin on your feet and to dry, brittle toenails. Do not apply lotion between your toes.  Trim your toenails straight across. Do not dig under them or around the cuticle. File the edges of your nails with an emery board or nail file.  Do not cut corns or calluses or try to remove them with medicine.  Wear clean socks or stockings every day. Make sure they are not too tight. Do not wear knee-high stockings since they may decrease blood flow to your legs.  Wear shoes that fit properly and have enough cushioning. To break in new shoes, wear them for just a few hours a day. This prevents you from injuring your feet. Always look in your shoes before you put them on to be sure there are no objects inside.  Do not cross your legs. This may decrease the blood flow to your feet.  If you find a minor scrape,  cut, or break in the skin on your feet, keep it and the skin around it clean and dry. These areas may be cleansed with mild soap and water. Do not cleanse the area with peroxide, alcohol, or iodine.  When you remove an adhesive bandage, be sure not to damage the skin around it.  If you have a wound, look at it several times a day to make sure it is healing.  Do not use heating pads or hot water bottles. They may burn your skin. If you have lost feeling in your feet or legs, you may not know it is happening until it is too late.  Make sure your health care provider performs a complete foot exam at least annually or more often if you have foot problems. Report any cuts, sores, or bruises to your health care provider immediately. SEEK MEDICAL CARE IF:   You have an injury that is not healing.  You have cuts or breaks in the skin.  You have an ingrown nail.  You notice redness on your legs or feet.  You feel burning or tingling in your legs or feet.  You have pain or cramps in your legs and feet.  Your legs or feet are numb.  Your feet always feel cold. SEEK IMMEDIATE MEDICAL CARE IF:   There is increasing redness,   swelling, or pain in or around a wound.  There is a red line that goes up your leg.  Pus is coming from a wound.  You develop a fever or as directed by your health care provider.  You notice a bad smell coming from an ulcer or wound.   This information is not intended to replace advice given to you by your health care provider. Make sure you discuss any questions you have with your health care provider.   Document Released: 04/07/2000 Document Revised: 12/11/2012 Document Reviewed: 09/17/2012 Elsevier Interactive Patient Education 2016 [employer].  

## 2015-07-28 NOTE — Assessment & Plan Note (Signed)
Well controlled. Continue current medication.  

## 2015-07-28 NOTE — Assessment & Plan Note (Signed)
Stable on nexium. ttrigger avoidance.

## 2015-07-28 NOTE — Assessment & Plan Note (Signed)
Well controlled on low dose metformin.

## 2015-08-11 ENCOUNTER — Telehealth: Payer: Self-pay | Admitting: Family Medicine

## 2015-08-11 NOTE — Telephone Encounter (Signed)
[place]    --------------------------------------------------------------------------------   Patient Name: [name]  Gender: Female  DOB: Sep 19, 1922   Age: 82 Y 16 M 2 D  Return Phone Number: [phone] (Primary), [phone] (Secondary)  Address: [address]    City/State/Zip: [place]  [address]   Client [facility] - Client  Client Site [place] - Day  Physician Diona Browner, [place]   Contact Type Call  Who Is Calling Patient / Member / Family / Caregiver  Call Type Triage / Clinical  Relationship To Patient Self  Return Phone Number [phone] (Primary)  Chief Complaint Abdominal Pain  Reason for Call Symptomatic / Request for Health Information  Initial Comment Caller states having abdominal pain in upper stomach since Easter off and on. No v/d. Diabetic. Has been taking Nexium, tums and pepto.  Appointment Disposition EMR Appointment Not Necessary  Info pasted into Epic Yes  PreDisposition Home Care  Translation No       Nurse Assessment  Nurse: Amalia Hailey, RN, Melissa Date/Time (Eastern Time): 08/11/2015 4:47:16 PM  Confirm and document reason for call. If symptomatic, describe symptoms. You must click the next button to save text entered. ---Caller states having abdominal pain in upper stomach since Easter off and on. No v/d. Diabetic. Has been taking Nexium, tums and pepto.    Has the patient traveled out of the country within the last 30 days? ---Not Applicable    Does the patient have any new or worsening symptoms? ---Yes    Will a triage be completed? ---Yes    Related visit to physician within the last 2 weeks? ---No    Does the PT have any chronic conditions? (i.e. diabetes, asthma, etc.) ---Yes    List chronic conditions. ---Diabetes-Metformin, hypertension, Stroke-20-30 yrs ago    Is this a  behavioral health or substance abuse call? ---No           Guidelines          Guideline Title Affirmed Question Affirmed Notes Nurse Date/Time (Eastern Time)  Abdominal Pain - Upper [1] Pain lasts > 10 minutes AND [2] age > 38    Evans, RN, Melissa 08/11/2015 4:51:10 PM    Disp. Time [name] Time) Disposition Final User         08/11/2015 4:58:25 PM Go to ED Now Yes Amalia Hailey, RN, Lenna Sciara            Caller Understands: Yes  Disagree/Comply: Disagree  Disagree/Comply Reason: Wait and see    Care Advice Given Per Guideline        GO TO ED NOW: You need to be seen in the Emergency Department. Go to the ER at ___________ [place] now. Drive carefully. DRIVING: Another adult should drive. Do not delay going to the Emergency Department. If immediate transportation is not available via car or taxi, then the patient should be instructed to call [id number].        --------------------------------------------------------------------------------            Referrals  [facility] - ED                  User: Colin Ina, RN Date/Time [name] Time): 08/11/2015 5:02:31 PM  Caller at the end of the call reports she will probably go to the UC and have her daughter drive her there.

## 2015-08-12 ENCOUNTER — Ambulatory Visit: Payer: Medicare Other | Admitting: Family Medicine

## 2015-08-12 ENCOUNTER — Encounter: Payer: Self-pay | Admitting: Family Medicine

## 2015-08-12 ENCOUNTER — Ambulatory Visit (INDEPENDENT_AMBULATORY_CARE_PROVIDER_SITE_OTHER): Payer: Medicare Other | Admitting: Family Medicine

## 2015-08-12 VITALS — BP 92/58 | HR 82 | Temp 97.7°F | Wt 131.2 lb

## 2015-08-12 DIAGNOSIS — R109 Unspecified abdominal pain: Secondary | ICD-10-CM | POA: Diagnosis not present

## 2015-08-12 DIAGNOSIS — R1010 Upper abdominal pain, unspecified: Secondary | ICD-10-CM | POA: Diagnosis not present

## 2015-08-12 LAB — COMPREHENSIVE METABOLIC PANEL
ALT: 13 U/L (ref 0–35)
AST: 17 U/L (ref 0–37)
Albumin: 3.9 g/dL (ref 3.5–5.2)
Alkaline Phosphatase: 43 U/L (ref 39–117)
BUN: 29 mg/dL — ABNORMAL HIGH (ref 6–23)
CALCIUM: 9.7 mg/dL (ref 8.4–10.5)
CHLORIDE: 104 meq/L (ref 96–112)
CO2: 26 meq/L (ref 19–32)
Creatinine, Ser: 1.38 mg/dL — ABNORMAL HIGH (ref 0.40–1.20)
GFR: 38.13 mL/min — AB (ref >60.00)
GLUCOSE: 173 mg/dL — AB (ref 70–99)
Potassium: 3.9 mEq/L (ref 3.5–5.1)
Sodium: 138 mEq/L (ref 135–145)
Total Bilirubin: 0.4 mg/dL (ref 0.2–1.2)
Total Protein: 7 g/dL (ref 6.0–8.3)

## 2015-08-12 LAB — POC URINALSYSI DIPSTICK (AUTOMATED)
BILIRUBIN UA: NEGATIVE
GLUCOSE UA: NEGATIVE
KETONES UA: NEGATIVE
NITRITE UA: NEGATIVE
PH UA: 5.5
Protein, UA: NEGATIVE
RBC UA: NEGATIVE
Spec Grav, UA: >=1.03
Urobilinogen, UA: 4

## 2015-08-12 LAB — CBC WITH DIFFERENTIAL/PLATELET
BASOS PCT: 0.3 % (ref 0.0–3.0)
Basophils Absolute: 0 10*3/uL (ref 0.0–0.1)
EOS PCT: 0.9 % (ref 0.0–5.0)
Eosinophils Absolute: 0.1 10*3/uL (ref 0.0–0.7)
HEMATOCRIT: 40 % (ref 36.0–46.0)
Hemoglobin: 13.4 g/dL (ref 12.0–15.0)
LYMPHS PCT: 19.5 % (ref 12.0–46.0)
Lymphs Abs: 2 10*3/uL (ref 0.7–4.0)
MCHC: 33.5 g/dL (ref 30.0–36.0)
MCV: 91.1 fl (ref 78.0–100.0)
Monocytes Absolute: 0.8 10*3/uL (ref 0.1–1.0)
Monocytes Relative: 8.2 % (ref 3.0–12.0)
NEUTROS ABS: 7.2 10*3/uL (ref 1.4–7.7)
Neutrophils Relative %: 71.1 % (ref 43.0–77.0)
PLATELETS: 254 10*3/uL (ref 150.0–400.0)
RBC: 4.4 Mil/uL (ref 3.87–5.11)
RDW: 14.6 % (ref 11.5–15.5)
WBC: 10.1 10*3/uL (ref 4.0–10.5)

## 2015-08-12 NOTE — Progress Notes (Signed)
Pre visit review using our clinic review tool, if applicable. No additional management support is needed unless otherwise documented below in the visit note.  Her stomach had been upset for the last few days.  Then recently she had some upper abd pain that lasted about 15 minutes, after eating a meal.  Then off and on, through yesterday, she had episodic abd pain.  Never had NAV, never had diarrhea.  Last BM was yesterday AM.  Dec in BM frequency recently.  No known blood in stool.   She still feels a little bloated, but better than it was in the last few days.  She has had some nocturia recently.  No burning with urination.    She is off statin, but still on ACE, with some lightheadedness recently.  D/w pt.  She has had recent dec in appetite.    No recent nsaid use, no in the last few weeks.  Still on PPI.  She had missed some doses recently, but is back to taking in the last few days.    Meds, vitals, and allergies reviewed.   ROS: See HPI.  Otherwise, noncontributory.  GEN: nad, alert and oriented, elderly female.  HEENT: mucous membranes moist NECK: supple w/o LA CV: rrr. PULM: ctab, no inc wob ABD: soft, +bs, no rebound, slightly protuberant but not overtly distended.   EXT: no edema SKIN: no acute rash

## 2015-08-12 NOTE — Patient Instructions (Addendum)
Stop taking the ramipril for now, since your BP was low today.   Go to the lab on the way out.  We'll contact you with your lab report. Update [place] in a few days.  You may need to restart the ramipril in a few days if your BP is higher and you aren't lightheaded.  Take care.  Glad to see you.

## 2015-08-12 NOTE — Telephone Encounter (Signed)
Pt called back and is concerned about my call; pt thinking my call means something bad is going on and pt thinks should go to ED now. I advised pt when a pt does not comply with TH advice I call to see how pt is doing now and offer an appt if available in [facility]. Pt said she does not feel any worse than she did last night and now pain level is 1-2. Pt will keep appt with Dr Damita Dunnings unless condition worsens and then pt will go to ED. Tried to reassure pt . FYI to Dr Damita Dunnings.

## 2015-08-12 NOTE — Telephone Encounter (Signed)
I spoke with pt and she continues with upper abd pain and swelling; pt said some constipation also; no fever. Pt did not go to UC or ED and request appt at [facility]. Pt has appt with Dr Damita Dunnings 08/12/15 at 2 pm if pt condition changes or worsens prior to appt pt will go to [facility] or ED.

## 2015-08-12 NOTE — Telephone Encounter (Signed)
Noted. Thanks.

## 2015-08-12 NOTE — Telephone Encounter (Signed)
Thanks

## 2015-08-13 DIAGNOSIS — R109 Unspecified abdominal pain: Secondary | ICD-10-CM | POA: Insufficient documentation

## 2015-08-13 LAB — URINE CULTURE
Colony Count: NO GROWTH
ORGANISM ID, BACTERIA: NO GROWTH

## 2015-08-13 NOTE — Assessment & Plan Note (Signed)
Come better today.  No red flag sx or findings.  Okay for outpatient f/u.  She has restarted PPI and this may be the intervention that is helping.  Would continue for now.  Check basic labs.  D/w pt.   She has been lightheaded, likely from dec in intake recently.  D/w pt about holding ACE in the meantime to allow a higher BP.  She agrees.   At this point, still okay for outpatient f/u.  She'll update [place] as needed.  Routed to PCP as FYI.  >25 minutes spent in face to face time with patient, >50% spent in counselling or coordination of care.

## 2015-08-16 ENCOUNTER — Telehealth: Payer: Self-pay | Admitting: Family Medicine

## 2015-08-16 ENCOUNTER — Telehealth: Payer: Self-pay | Admitting: *Deleted

## 2015-08-16 MED ORDER — RAMIPRIL 5 MG PO CAPS: ORAL_CAPSULE | ORAL | Status: DC

## 2015-08-16 NOTE — Telephone Encounter (Signed)
Patient confirmed getting the message and understands instructions.

## 2015-08-16 NOTE — Telephone Encounter (Signed)
Left detailed message on voicemail.  

## 2015-08-16 NOTE — Telephone Encounter (Signed)
No future appointments scheduled.  Called to follow up with patient - no answer, no voice mail to leave a message.

## 2015-08-16 NOTE — Addendum Note (Signed)
Addended by: Tonia Ghent on: 08/16/2015 11:20 AM   Modules accepted: Orders

## 2015-08-16 NOTE — Telephone Encounter (Signed)
Appointment 4/25 [facility] number [phone] Pt has questions about her  bp meds She wants to know if she should take her bp meds today

## 2015-08-16 NOTE — Telephone Encounter (Signed)
Please call pt. I would restart the ramipril, 5mg  a day, EMR updated.  I think that her low BP was due to the medicine and dec in intake.  If she is back to her normal intake, then she may be able to tolerate the medicine.  If she has low BP on the med after restart, then we can possibly decrease down to 2.5mg  a day.   Routed to PCP as FYI.  Thanks.

## 2015-08-16 NOTE — Telephone Encounter (Signed)
See other phone note from [facility].

## 2015-08-16 NOTE — Telephone Encounter (Signed)
[facility] Patient Name: EMERIE AYO DOB: [birth year] Initial Comment Caller states was in office and her B/P was low rather than high, was told to hold BP meds. wants to know if she should continue to take the meds. 124/80 this morning. Left side of her stomach hurts. Nurse Assessment Nurse: Dimas Chyle, RN, Dellis Filbert Date/Time [name] Time): 08/16/2015 8:50:41 AM Confirm and document reason for call. If symptomatic, describe symptoms. You must click the next button to save text entered. ---Caller states was in office and her B/P was low rather than high, was told to hold BP meds. wants to know if she should continue to take the meds. 124/80 this morning. Left side of her stomach hurts. Takes 5 mg Ramipril. Has the patient traveled out of the country within the last 30 days? ---No Does the patient have any new or worsening symptoms? ---Yes Will a triage be completed? ---Yes Related visit to physician within the last 2 weeks? ---Yes Does the PT have any chronic conditions? (i.e. diabetes, asthma, etc.) ---Yes List chronic conditions. ---HTN, diabetes type 2 Is this a behavioral health or substance abuse call? ---No Guidelines Guideline Title Affirmed Question Affirmed Notes High Blood Pressure BP 120-139 / 80-89 (all triage questions negative) Constipation Over-The-Counter (OTC) medicines for mild constipation (all triage questions negative) Final Disposition User Brice, RN, Dellis Filbert Comments Spoke with office backline about caller needing to know about holding B/P meds. Soft-transferred caller to backline. Disagree/Comply: Comply Disagree/Comply: Comply

## 2015-08-16 NOTE — Telephone Encounter (Signed)
PLEASE NOTE: All timestamps contained within this report are represented as [place] Standard Time. CONFIDENTIALTY NOTICE: This fax transmission is intended only for the addressee. It contains information that is legally privileged, confidential or otherwise protected from use or disclosure. If you are not the intended recipient, you are strictly prohibited from reviewing, disclosing, copying using or disseminating any of this information or taking any action in reliance on or regarding this information. If you have received this fax in error, please notify us immediately by telephone so that we can arrange for its return to [place]. Phone: [phone], Toll-Free: [phone], Fax: [phone] Page: 1 of 2 Call Id: [id number] [place] Patient Name: [name] Gender: Female DOB: 07-30-1922 Age: 82 Y 68 M 5 D Return Phone Number: [phone] (Primary) Address: City/State/Zip: [place] Client [place] [facility] - Client Client Site [place] Physician Renford Dills Contact Type Call Who Is Calling Patient / Member / Family / Caregiver Call Type Triage / Clinical Relationship To Patient Self Return Phone Number [phone] (Primary) Chief Complaint Blood Pressure High Reason for Call Symptomatic / Request for [place] states she saw the Dr yesterday. her blood pressure was very low. Told her not to take her medication. BP is now 171/85, PreDisposition InappropriateToAsk Translation No Nurse Assessment Nurse: Donovan Kail, RN, Barnetta Chapel Date/Time (Eastern Time): 08/13/2015 7:54:55 PM Confirm and document reason for call. If symptomatic, describe symptoms. You must click the next button to save text entered. ---Caller states she saw the Dr yesterday. her blood pressure was very low. Told her not to take her medication.  BP is now 171/85, Yesterday it was very low. Taken off of Ramipril. Right now- she feels fine. Has the patient traveled out of the country within the last 30 days? ---Not Applicable Does the patient have any new or worsening symptoms? ---Yes Will a triage be completed? ---Yes Related visit to physician within the last 2 weeks? ---Yes Does the PT have any chronic conditions? (i.e. diabetes, asthma, etc.) ---Yes List chronic conditions. ---HTN, off of Ramipril, Off of Simvastatin, aspirin, something for her bones, Nexium- GERD, Diabetes, Is this a behavioral health or substance abuse call? ---No Guidelines Guideline Title Affirmed Question Affirmed Notes Nurse Date/Time (Eastern Time) High Blood Pressure BP # 160/100 Donovan Kail, RN, Barnetta Chapel 08/13/2015 7:55:10 PM Disp. Time [name] Time) Disposition Final User 08/13/2015 8:02:22 PM See PCP When Office is Open (within 3 days) Yes Donovan Kail, RN, Barnetta Chapel PLEASE NOTE: All timestamps contained within this report are represented as [place] Standard Time. CONFIDENTIALTY NOTICE: This fax transmission is intended only for the addressee. It contains information that is legally privileged, confidential or otherwise protected from use or disclosure. If you are not the intended recipient, you are strictly prohibited from reviewing, disclosing, copying using or disseminating any of this information or taking any action in reliance on or regarding this information. If you have received this fax in error, please notify us immediately by telephone so that we can arrange for its return to [place]. Phone: [phone], Toll-Free: [phone], Fax: [phone] Page: 2 of 2 Call Id: [id number] [place] Understands: Yes Disagree/Comply: Comply Care Advice Given Per Guideline SEE PCP WITHIN 3 DAYS: * You need to be seen within 2 or 3 days. Call your doctor during regular office hours and make an appointment. An urgent care center is often the best source of care  if your  doctor's office is closed or you can't get an appointment. NOTE: If office will be open tomorrow, tell caller to call then, not in 3 days. LIFESTYLE MODIFICATIONS - The following things can help you reduce your blood pressure: * EAT HEALTHY: Eat a diet rich in fresh fruits and vegetables, dietary fiber, nonanimal protein (e.g., soy), and low-fat dairy products. Avoid foods with a high content of saturated fat or cholesterol. * DECREASE SODIUM INTAKE: Aim to eat less than 2.4 g (100 mmol) of sodium each day. Unfortunately 75% of the salt in the average person's diet is in pre-processed foods. * LIMIT ALCOHOL: Limit alcohol to 0-2 standard drinks each day. Men should have less than 14 dinks per week. Women should have less than 9 drinks per week. A drink is 1.5 oz hard liquor (one shot or jigger; 45 ml), 5 oz wine (small glass; 150 ml), 12 oz beer (one can; 360 ml). * REDUCE WEIGHT AND WAIST LINE: It is important to maintain a normal body weight. The goal should be a BMI (body mass index) under 25 for men and women, a waist circumference under 40 inches (102 cm) in men, and a waist circumference under 35 inches (88 cm) in women. * REDUCE STRESS: Find activities that help reduce your stress. Examples might include meditation, yoga, or even a restful walk in a park. * EXERCISE, BE MORE PHYSICALLY ACTIVE: Do at least 30 minutes of aerobic exercise (e.g., brisk walking) most days of the week. Other examples of aerobic activities cycling, jogging, and swimming. CALL BACK IF: * You become worse. CARE ADVICE given per High Blood Pressure (Adult) guideline. Referrals REFERRED TO PCP OFFICE

## 2015-08-17 ENCOUNTER — Encounter: Payer: Self-pay | Admitting: Family Medicine

## 2015-08-17 ENCOUNTER — Ambulatory Visit (INDEPENDENT_AMBULATORY_CARE_PROVIDER_SITE_OTHER): Payer: Medicare Other | Admitting: Family Medicine

## 2015-08-17 VITALS — BP 145/76 | HR 76 | Temp 97.5°F | Ht 63.0 in | Wt 131.5 lb

## 2015-08-17 DIAGNOSIS — I10 Essential (primary) hypertension: Secondary | ICD-10-CM

## 2015-08-17 DIAGNOSIS — R1010 Upper abdominal pain, unspecified: Secondary | ICD-10-CM

## 2015-08-17 NOTE — Progress Notes (Signed)
   Subjective:    Patient ID: [name], female    DOB: November 04, 1922, 82 y.o.   MRN: [id number]  HPI  82 year old female presents for follow up.  She was seen by Dr. Damita Dunnings on 4/20 for abdominal pain.  At that time felt likely abd pain was better given on nexium.  U Cx neg.  CMET nml except slightly elevated cr at 1.38.Marland Kitchen Felt due to dehydration, mild. CBC nml.  At that time BP was slightly low at 92/58. She was told at that time to hold the  Ramipril.  Today BP is elevated slightly, restarted the medication today given 167-179/80-89. BP Readings from Last 3 Encounters:  08/17/15 145/76  08/12/15 92/58  07/15/15 134/81   She has noted abd pain on left side, gradually better day by day. No nausea, no emesis, no blood in stool. No diarrhea, slightly constipated last week, BM yesterday and today har, she was straining.  Has now started citricel.  Drank 10 glasses of water day before yesterday. She is now feeling better overall, appetite improved.  Review of Systems  Constitutional: Negative for fever and fatigue.  HENT: Negative for ear pain.   Eyes: Negative for pain.  Respiratory: Negative for chest tightness and shortness of breath.   Cardiovascular: Negative for chest pain, palpitations and leg swelling.  Gastrointestinal: Positive for abdominal pain.  Genitourinary: Negative for dysuria.       Objective:   Physical Exam  Constitutional: Vital signs are normal. She appears well-developed and well-nourished. She is cooperative.  Non-toxic appearance. She does not appear ill. No distress.  Elderly , nontoxic  HENT:  Head: Normocephalic.  Right Ear: Hearing, tympanic membrane, external ear and ear canal normal. Tympanic membrane is not erythematous, not retracted and not bulging.  Left Ear: Hearing, tympanic membrane, external ear and ear canal normal. Tympanic membrane is not erythematous, not retracted and not bulging.  Nose: No mucosal edema or rhinorrhea. Right  sinus exhibits no maxillary sinus tenderness and no frontal sinus tenderness. Left sinus exhibits no maxillary sinus tenderness and no frontal sinus tenderness.  Mouth/Throat: Uvula is midline, oropharynx is clear and moist and mucous membranes are normal.  Eyes: Conjunctivae, EOM and lids are normal. Pupils are equal, round, and reactive to light. Lids are everted and swept, no foreign bodies found.  Neck: Trachea normal and normal range of motion. Neck supple. Carotid bruit is not present. No thyroid mass and no thyromegaly present.  Cardiovascular: Normal rate, regular rhythm, S1 normal, S2 normal, normal heart sounds, intact distal pulses and normal pulses.  Exam reveals no gallop and no friction rub.   No murmur heard. Pulmonary/Chest: Effort normal and breath sounds normal. No tachypnea. No respiratory distress. She has no decreased breath sounds. She has no wheezes. She has no rhonchi. She has no rales.  Abdominal: Soft. Normal appearance and bowel sounds are normal. There is no hepatosplenomegaly. There is tenderness in the left upper quadrant. There is no rigidity, no guarding and no CVA tenderness.  Mild tenderness in left mid abdomen  Neurological: She is alert.  Skin: Skin is warm, dry and intact. No rash noted.  Psychiatric: Her speech is normal and behavior is normal. Judgment and thought content normal. Her mood appears not anxious. Cognition and memory are normal. She does not exhibit a depressed mood.          Assessment & Plan:

## 2015-08-17 NOTE — Progress Notes (Signed)
Pre visit review using our clinic review tool, if applicable. No additional management support is needed unless otherwise documented below in the visit note. 

## 2015-08-17 NOTE — Patient Instructions (Addendum)
Drink water daily. Increase fiber in diet. Continue citrucel daily, can increase to 2 times a day. Continue long term unless loose stools.  If still constipation, you can use miralax for 1-2 days to have bowel movement.

## 2015-08-17 NOTE — Assessment & Plan Note (Signed)
Likely secondary to constipation.  Continue citrucel, increase fiber and water in diet. Can use miralax if needed.

## 2015-08-17 NOTE — Assessment & Plan Note (Signed)
BP too high off ramipril 5 mg daily. Low BP likely due to dehydration with abd pain and constipation. Now fluid intake improved. BP controlled back on ramipril.

## 2015-08-20 ENCOUNTER — Telehealth: Payer: Self-pay | Admitting: Family Medicine

## 2015-08-20 NOTE — Telephone Encounter (Signed)
Pt calling about Prolia inj. She forgot to call you at the beginning of April. Please advise.

## 2015-08-20 NOTE — Telephone Encounter (Signed)
[name] can you get approval thru her insurance and then I will get her scheduled.

## 2015-08-23 NOTE — Telephone Encounter (Signed)
Pt called checking on prolia Best number [phone]

## 2015-08-27 ENCOUNTER — Telehealth: Payer: Self-pay | Admitting: Family Medicine

## 2015-08-27 NOTE — Telephone Encounter (Signed)
PLEASE NOTE: All timestamps contained within this report are represented as [place] Standard Time. CONFIDENTIALTY NOTICE: This fax transmission is intended only for the addressee. It contains information that is legally privileged, confidential or otherwise protected from use or disclosure. If you are not the intended recipient, you are strictly prohibited from reviewing, disclosing, copying using or disseminating any of this information or taking any action in reliance on or regarding this information. If you have received this fax in error, please notify us immediately by telephone so that we can arrange for its return to [place]. Phone: [phone], Toll-Free: [phone], Fax: [phone] Page: 1 of 2 Call Id: [id number] [place] Patient Name: [name] Gender: Female DOB: [birth year] Age: 82 Y 58 M 18 D Return Phone Number: [phone] (Primary), [phone] (Secondary) Address: [address] City/State/Zip: [place] [address] Client [facility] Day - Client Client Site [place] - Day Physician Eliezer Lofts - MD Contact Type Call Who Is Calling Patient / Member / Family / Caregiver Call Type Triage / Clinical Caller Name [name] Relationship To Patient Daughter Return Phone Number [phone] (Primary) Chief Complaint Knee Injury Reason for Call Symptomatic / Request for Health Information Initial Comment Caller states mother fell and hurt knee, put ice on it, can walk on it, doesn't hurt Appointment Disposition EMR Appointment Not Necessary Info pasted into Epic Yes PreDisposition Home Care Translation No Nurse Assessment Nurse: Ronnald Ramp, RN, [name] Date/Time (Eastern Time): 08/27/2015 1:26:32 PM Confirm and document reason for call. If symptomatic, describe symptoms. You must click the next button to save  text entered. ---Caller states her mother fell and hit her left knee about 1.5 hrs ago. She is having no pain or difficulty walking. There is some redness. Has the patient traveled out of the country within the last 30 days? ---Not Applicable Does the patient have any new or worsening symptoms? ---Yes Will a triage be completed? ---Yes Related visit to physician within the last 2 weeks? ---No Does the PT have any chronic conditions? (i.e. diabetes, asthma, etc.) ---Yes List chronic conditions. ---HTN Is this a behavioral health or substance abuse call? ---No Guidelines Guideline Title Affirmed Question Affirmed Notes Nurse Date/Time [name] Time) Knee Injury Minor injury or pain from direct blow (all triage questions negative) Ronnald Ramp, RN, [name] 08/27/2015 1:28:12 PM Disp. Time [name] Time) Disposition Final User PLEASE NOTE: All timestamps contained within this report are represented as [place] Standard Time. CONFIDENTIALTY NOTICE: This fax transmission is intended only for the addressee. It contains information that is legally privileged, confidential or otherwise protected from use or disclosure. If you are not the intended recipient, you are strictly prohibited from reviewing, disclosing, copying using or disseminating any of this information or taking any action in reliance on or regarding this information. If you have received this fax in error, please notify us immediately by telephone so that we can arrange for its return to [place]. Phone: [phone], Toll-Free: [phone], Fax: [phone] Page: 2 of 2 Call Id: [id number] 08/27/2015 1:30:49 PM Home Care Yes Ronnald Ramp, RN, [name] Understands: Yes Disagree/Comply: Comply Care Advice Given Per Guideline HOME CARE: You should be able to treat this at home. REASSURANCE: It sounds like a bruised muscle or bone. We can treat that at home. CALL BACK IF: * Swelling or bruise becomes over 2 inches (5 cm). * Pain not improved  after 3 days * You become  worse. CARE ADVICE given per Knee Injury (Adult) guideline.

## 2015-08-27 NOTE — Telephone Encounter (Signed)
Patient Name: LANICE BRANDA DOB: 1922-07-04 Initial Comment Caller states mother fell and hurt knee, put ice on it, can walk on it, doesn't hurt Nurse Assessment Nurse: Ronnald Ramp, RN, [name] Date/Time (Eastern Time): 08/27/2015 1:26:32 PM Confirm and document reason for call. If symptomatic, describe symptoms. You must click the next button to save text entered. ---Caller states her mother fell and hit her left knee about 1.5 hrs ago. She is having no pain or difficulty walking. There is some redness. Has the patient traveled out of the country within the last 30 days? ---Not Applicable Does the patient have any new or worsening symptoms? ---Yes Will a triage be completed? ---Yes Related visit to physician within the last 2 weeks? ---No Does the PT have any chronic conditions? (i.e. diabetes, asthma, etc.) ---Yes List chronic conditions. ---HTN Is this a behavioral health or substance abuse call? ---No Guidelines Guideline Title Affirmed Question Affirmed Notes Knee Injury Minor injury or pain from direct blow (all triage questions negative) Final Disposition User [place], RN, [name] Disagree/Comply: [name]

## 2015-08-28 DIAGNOSIS — M25562 Pain in left knee: Secondary | ICD-10-CM | POA: Diagnosis not present

## 2015-08-30 ENCOUNTER — Ambulatory Visit: Payer: Medicare Other | Admitting: Internal Medicine

## 2015-08-30 ENCOUNTER — Telehealth: Payer: Self-pay

## 2015-08-30 NOTE — Telephone Encounter (Signed)
Noted, will discuss at OV

## 2015-08-30 NOTE — Telephone Encounter (Signed)
I spoke with pt and she was seen on 08/28/15( pt is not sure where she was seen); pt had fluid drawn off knee and given cortisone injection and pt has appt on 09/02/15 with orthopedic. Still painful today but better than when seen over weekend. pt said BP continuing to go up and down and wants appt to be seen this afternoon. Pt scheduled appt 08/30/15 at 2 pm with R Baity NP to ck BP. FYI to Dr Diona Browner.

## 2015-08-30 NOTE — Telephone Encounter (Signed)
PLEASE NOTE: All timestamps contained within this report are represented as [place] Standard Time. CONFIDENTIALTY NOTICE: This fax transmission is intended only for the addressee. It contains information that is legally privileged, confidential or otherwise protected from use or disclosure. If you are not the intended recipient, you are strictly prohibited from reviewing, disclosing, copying using or disseminating any of this information or taking any action in reliance on or regarding this information. If you have received this fax in error, please notify us immediately by telephone so that we can arrange for its return to [place]. Phone: [phone], Toll-Free: [phone], Fax: [phone] Page: 1 of 2 Call Id: [id number] [place] Patient Name: [name] Gender: Female DOB: June 11, 1922 Age: 82 Y 73 M 19 D Return Phone Number: [phone] (Primary) Address: City/State/Zip: [place] Night - Client Client Site [place] Physician [name] - MD Contact Type Call Who Is Calling Patient / Member / Family / Caregiver Call Type Triage / Clinical Relationship To Patient Self Return Phone Number [phone] (Primary) Chief Complaint Knee Injury Reason for Call Symptomatic / Request for [name] states she fell yesterday and hurt her knee. Has iced it and it doesn't hurt if she's lying down. It does hurt when bends it or puts pressure on it. Has taken Tylenol for the pain. Pt has diabetes and is on Ramipril for her heart. PreDisposition Home Care Translation No Nurse Assessment Nurse: Cox, RN, [place] Date/Time (Eastern Time): 08/28/2015 8:42:21 AM Confirm and document reason for call. If symptomatic, describe symptoms. You must click the next button to save text entered. ---Caller states she  fell and injured knee yesterday. Has iced knee but still has pain when she bends it and puts pressure on it Has the patient traveled out of the country within the last 30 days? ---No Does the patient have any new or worsening symptoms? ---Yes Will a triage be completed? ---Yes Related visit to physician within the last 2 weeks? ---Yes Does the PT have any chronic conditions? (i.e. diabetes, asthma, etc.) ---Yes List chronic conditions. ---DM, HTN, high cholesterol, GERD Is this a behavioral health or substance abuse call? ---No Guidelines Guideline Title Affirmed Question Affirmed Notes Nurse Date/Time (Eastern Time) Knee Injury [1] High-risk adult (e.g., age > 54, osteoporosis, chronic steroid use) AND [2] limping [name], Therapist, sports, [place] [id number] AM Disp. Time [name] Time) Disposition Final User PLEASE NOTE: All timestamps contained within this report are represented as [place] Standard Time. CONFIDENTIALTY NOTICE: This fax transmission is intended only for the addressee. It contains information that is legally privileged, confidential or otherwise protected from use or disclosure. If you are not the intended recipient, you are strictly prohibited from reviewing, disclosing, copying using or disseminating any of this information or taking any action in reliance on or regarding this information. If you have received this fax in error, please notify us immediately by telephone so that we can arrange for its return to [place]. Phone: [phone], Toll-Free: [phone], Fax: [phone] Page: 2 of 2 Call Id: [id number] 08/28/2015 8:55:19 AM See Physician within 24 Hours Yes Cox, RN, Media planner Understands: Yes Disagree/Comply: Comply Care Advice Given Per Guideline PAIN MEDICINES: ACETAMINOPHEN (E.G., TYLENOL): * Take 650 mg (two 325 mg pills) by mouth every 4-6 hours as needed. Each Regular Strength Tylenol pill has 325 mg of acetaminophen. The most you should take  each day is 3,250  mg (10 Regular Strength pills a day). * Use the lowest amount that makes your pain feel better. * For pain relief, take acetaminophen, ibuprofen, or naproxen. CAUTION - NSAIDS (E.G., IBUPROFEN, NAPROXEN): * Do not take nonsteroidal anti-inflammatory drugs (NSAIDs) if you have stomach problems, kidney disease, heart failure, or other contraindications to using this type of medication. CALL BACK IF: * You become worse. CARE ADVICE given per Knee Injury (Adult) guideline. LOCAL COLD: For bruises or swelling, apply a cold pack or an ice bag (wrapped in a moist towel) to the area for 20 minutes per hour. Repeat for 4 consecutive hours. (Reason: reduce the bleeding and pain) Referrals [facility]

## 2015-08-31 ENCOUNTER — Ambulatory Visit (INDEPENDENT_AMBULATORY_CARE_PROVIDER_SITE_OTHER): Payer: Medicare Other | Admitting: Internal Medicine

## 2015-08-31 ENCOUNTER — Encounter: Payer: Self-pay | Admitting: Internal Medicine

## 2015-08-31 VITALS — BP 124/70 | HR 73 | Temp 97.8°F | Wt 131.5 lb

## 2015-08-31 DIAGNOSIS — W1849XA Other slipping, tripping and stumbling without falling, initial encounter: Secondary | ICD-10-CM | POA: Diagnosis not present

## 2015-08-31 DIAGNOSIS — L539 Erythematous condition, unspecified: Secondary | ICD-10-CM

## 2015-08-31 DIAGNOSIS — M25562 Pain in left knee: Secondary | ICD-10-CM

## 2015-08-31 DIAGNOSIS — R030 Elevated blood-pressure reading, without diagnosis of hypertension: Secondary | ICD-10-CM

## 2015-08-31 DIAGNOSIS — W010XXA Fall on same level from slipping, tripping and stumbling without subsequent striking against object, initial encounter: Secondary | ICD-10-CM

## 2015-08-31 DIAGNOSIS — R0989 Other specified symptoms and signs involving the circulatory and respiratory systems: Secondary | ICD-10-CM

## 2015-08-31 NOTE — Telephone Encounter (Addendum)
I have electronically submitted pt's info for [place] verification and will notify you once I have a response.   I have notified pt we are waiting on authorization. Thank you.

## 2015-08-31 NOTE — Patient Instructions (Signed)
Hypotension  As your heart beats, it forces blood through your arteries. This force is your blood pressure. If your blood pressure is too low for you to go about your normal activities or to support the organs of your body, you have hypotension. Hypotension is also referred to as low blood pressure. When your blood pressure becomes too low, you may not get enough blood to your brain. As a result, you may feel weak, feel lightheaded, or develop a rapid heart rate. In a more severe case, you may faint.  CAUSES  Various conditions can cause hypotension. These include:  · Blood loss.  · Dehydration.  · Heart or endocrine problems.  · Pregnancy.  · Severe infection.  · Not having a well-balanced diet filled with needed nutrients.  · Severe allergic reactions (anaphylaxis).  Some medicines, such as blood pressure medicine or water pills (diuretics), may lower your blood pressure below normal. Sometimes taking too much medicine or taking medicine not as directed can cause hypotension.  TREATMENT   Hospitalization is sometimes required for hypotension if fluid or blood replacement is needed, if time is needed for medicines to wear off, or if further monitoring is needed. Treatment might include changing your diet, changing your medicines (including medicines aimed at raising your blood pressure), and use of support stockings.  HOME CARE INSTRUCTIONS   · Drink enough fluids to keep your urine clear or pale yellow.  · Take your medicines as directed by your health care provider.  · Get up slowly from reclining or sitting positions. This gives your blood pressure a chance to adjust.  · Wear support stockings as directed by your health care provider.  · Maintain a healthy diet by including nutritious food, such as fruits, vegetables, nuts, whole grains, and lean meats.  SEEK MEDICAL CARE IF:  · You have vomiting or diarrhea.  · You have a fever for more than 2-3 days.  · You feel more thirsty than usual.  · You feel weak and  tired.  SEEK IMMEDIATE MEDICAL CARE IF:   · You have chest pain or a fast or irregular heartbeat.  · You have a loss of feeling in some part of your body, or you lose movement in your arms or legs.  · You have trouble speaking.  · You become sweaty or feel lightheaded.  · You faint.  MAKE SURE YOU:   · Understand these instructions.  · Will watch your condition.  · Will get help right away if you are not doing well or get worse.     This information is not intended to replace advice given to you by your health care provider. Make sure you discuss any questions you have with your health care provider.     Document Released: 04/10/2005 Document Revised: 01/29/2013 Document Reviewed: 10/11/2012  Elsevier Interactive Patient Education ©2016 [employer] Inc[employer]

## 2015-08-31 NOTE — Progress Notes (Signed)
Pre visit review using our clinic review tool, if applicable. No additional management support is needed unless otherwise documented below in the visit note. 

## 2015-08-31 NOTE — Progress Notes (Signed)
Subjective:    Patient ID: SWAN FAIRFAX, female    DOB: 1923/03/15, 82 y.o.   MRN: [id number]  HPI  Pt presents to the clinic today with c/o left knee pain. This started 4 days ago. Her foot got tangled up and she fell onto her left knee. She went to Urgent Care and was sent to [facility] on Friday. She had a knee x-ray in which they found fluid, drained the fluid and gave her a Cortisone injection. She reports her knee hurt a lot on Saturday, but she has seen improvement in pain and swelling the last 2 days. She is using ice every 20 minutes for 20 minutes. She is taking Tylenol PRN for pain (she has not taken any in a few days).    Additionally, she is concerned about continued redness at the base of her toes on her left  foot.  She was seen in the clinic on 06/17/2015 for cellulitis of the foot, and was prescribed Ibuprofen 800 mg and an antibiotic. She reports no pain or swelling, but some continued redness.    Additionally, she wanted to talk about her blood pressure. Her blood pressure has been variable at her last several doctors visits. She is on Ramipril 5 mg.  When taken off the medication, her systolic blood pressure runs 150-160. Today in the office her blood pressure is 124/70. She denies dizziness, lightheadedness, or feeling faint when she first stand up or when she its walking.    Review of Systems  Past Medical History  Diagnosis Date  . Hyperlipidemia   . Hypertension   . Diabetes mellitus     Type II  . GERD (gastroesophageal reflux disease)   . Allergy   . Syncope and collapse     1 episode  . Orthostatic hypotension 04/2006    Hospital  . Palpitations   . Acute pharyngitis   . Other abscess of vulva   . Acute sinusitis, unspecified   . Acute upper respiratory infections of unspecified site   . Hemangioma of unspecified site   . Anal and rectal polyp   . Cataract     History of  . Osteopenia   . Osteoarthritis   . Gout   . Cavernous angioma   .  Degenerative cervical disc 02/2006    Current Outpatient Prescriptions  Medication Sig Dispense Refill  . aspirin EC 81 MG tablet Take 40.5 mg by mouth at bedtime.     . Blood Glucose Monitoring Suppl (ACCU-CHEK AVIVA PLUS) W/DEVICE KIT Use to check blood sugar daily as needed.  Dx: E11.9 1 kit 0  . Calcium Citrate (CITRACAL PO) Take 1 capsule by mouth daily.     . fish oil-omega-3 fatty acids 1000 MG capsule Take 1 g by mouth daily.      . fluticasone (FLONASE) 50 MCG/ACT nasal spray PLACE 2 SPRAYS INTO BOTH NOSTRILS DAILY. 16 g 11  . glucose blood (ACCU-CHEK AVIVA PLUS) test strip Use to check blood sugar daily as needed.  Dx: E11.9 100 each 3  . Lancets (FREESTYLE) lancets Use to check blood sugar once daily.  Dx: E11.9 100 each 3  . metFORMIN (GLUCOPHAGE) 500 MG tablet Take 0.5 tablets (250 mg total) by mouth daily with breakfast. 45 tablet 1  . Multiple Vitamin (MULTIVITAMIN) tablet Take 1 tablet by mouth.    Marland Kitchen NEXIUM 40 MG capsule TAKE 1 CAPSULE (40 MG TOTAL) BY MOUTH DAILY AT 12 NOON. 30 capsule 5  .  Polyethyl Glycol-Propyl Glycol (SYSTANE OP) Apply 1 drop to eye daily as needed (for dry eyes).    . ramipril (ALTACE) 5 MG capsule TAKE 1 CAPSULE (5 MG TOTAL) BY MOUTH DAILY.     No current facility-administered medications for this visit.    Allergies  Allergen Reactions  . Atorvastatin     REACTION: intolerant  . Codeine     REACTION: GI upset  . Rosuvastatin     REACTION: intolerant    Family History  Problem Relation Age of Onset  . Cancer Maternal Aunt     Breast CA  . Heart disease Other     CHF    Social History   Social History  . Marital Status: Married    Spouse Name: N/A  . Number of Children: 3  . Years of Education: N/A   Occupational History  . 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 Smoking status: Former Research scientist (life sciences)  . Smokeless tobacco: Never Used     Comment: quit 1970  . Alcohol Use: No  . Drug Use:  No  . Sexual Activity: Not on file   Other Topics Concern  . Not on file   Social History Narrative   Diet: fruit and veggies, water, lean protein.   Activity limited secondary to knee discomfort/arthritis, but going to church exersice group two times a week.                  Constitutional: Denies fever, malaise, fatigue, headache or abrupt weight changes.  Respiratory: Denies difficulty breathing, shortness of breath, cough or sputum production.   Cardiovascular: Denies chest pain, chest tightness, palpitations or swelling in the hands or feet.  Musculoskeletal: Pt reports left knee pain and swelling. Denies problems with the R knee.  Skin: Pt reports redness at the base of her 3rd and 4th toes on her L foot.  Neurological: Denies dizziness or problems with balance   No other specific complaints in a complete review of systems (except as listed in HPI above).     Objective:   Physical Exam  BP 124/70 mmHg  Pulse 73  Temp(Src) 97.8 F (36.6 C) (Oral)  Wt 131 lb 8 oz (59.648 kg)  SpO2 98% Wt Readings from Last 3 Encounters:  08/31/15 131 lb 8 oz (59.648 kg)  08/17/15 131 lb 8 oz (59.648 kg)  08/12/15 131 lb 4 oz (59.535 kg)    General: Appears her stated age, in NAD. Skin: Slight redness at the base of the 3rd and 4th toes of left foot, improved when her foot was out of her shoe. No signs of warmth, redness or streaking. No lesions or ulcerations noted. Cardiovascular: Normal rate and rhythm. S1,S2 noted.  No murmur, rubs or gallops noted.  Pulmonary/Chest: Normal effort and positive vesicular breath sounds. No respiratory distress. No wheezes, rales or ronchi noted.  Musculoskeletal: Swelling of the L knee. Contusion on the anterior tibia. No contusions along the joint line. No TTP along the joint line or posteriorly. Strength 5/5 of the knee bilaterally. No pain with flexion of the knee, some pain with full extension.    BMET    Component Value Date/Time   NA 138  08/12/2015 1500   K 3.9 08/12/2015 1500   CL 104 08/12/2015 1500   CO2 26 08/12/2015 1500   GLUCOSE 173* 08/12/2015 1500   BUN 29* 08/12/2015 1500   CREATININE 1.38* 08/12/2015 1500   CALCIUM  9.7 08/12/2015 1500   GFRNONAA 57* 11/21/2013 0959   GFRAA 66* 11/21/2013 0959    Lipid Panel     Component Value Date/Time   CHOL 165 07/13/2015 0823   TRIG 65.0 07/13/2015 0823   HDL 54.00 07/13/2015 0823   CHOLHDL 3 07/13/2015 0823   VLDL 13.0 07/13/2015 0823   LDLCALC 98 07/13/2015 0823    CBC    Component Value Date/Time   WBC 10.1 08/12/2015 1500   RBC 4.40 08/12/2015 1500   HGB 13.4 08/12/2015 1500   HCT 40.0 08/12/2015 1500   PLT 254.0 08/12/2015 1500   MCV 91.1 08/12/2015 1500   MCH 30.4 03/24/2015 1113   MCHC 33.5 08/12/2015 1500   RDW 14.6 08/12/2015 1500   LYMPHSABS 2.0 08/12/2015 1500   MONOABS 0.8 08/12/2015 1500   EOSABS 0.1 08/12/2015 1500   BASOSABS 0.0 08/12/2015 1500    Hgb A1C Lab Results  Component Value Date   HGBA1C 6.4 07/13/2015         Assessment & Plan:   Left Knee Pain:  Continue Ice as needed for pain Take Ibuprofen 400 mg twice a day with food until orthopaedics appointment on Thursday F/u with ortho on Thursday  Redness on left foot:  Monitor  Labile blood pressure:  Discussed signs and symptoms of low blood pressure Return precautions given If she is experiencing continued symptoms, she should get her BP checked Continue Ramipril 5 mg Monitor  F/u with PCP as scheduled

## 2015-09-02 DIAGNOSIS — S8002XA Contusion of left knee, initial encounter: Secondary | ICD-10-CM | POA: Diagnosis not present

## 2015-09-08 ENCOUNTER — Other Ambulatory Visit: Payer: Self-pay | Admitting: Family Medicine

## 2015-09-08 NOTE — Telephone Encounter (Signed)
[name] scheduled for Prolia Injection 09/09/2015 at 3:00pm.

## 2015-09-08 NOTE — Telephone Encounter (Signed)
I have rec'd Ms. Whittaker's insurance verification for Prolia and she has an estimated responsibility of 0% plus $183 deductible, if not met.  Please make pt aware this is an estimate and we will not know an exact amt until insurance(s) has/have paid.  I have sent a copy of the summary of benefits to be scanned into pt's chart.    Once pt recs injection, please let me know actual injection date so I can update the Prolia portal.  If you have any questions, please let me know.  Thank you!  Cc: Gina  Rippo-Purgason (for ordering purposes)

## 2015-09-09 ENCOUNTER — Ambulatory Visit (INDEPENDENT_AMBULATORY_CARE_PROVIDER_SITE_OTHER): Payer: Medicare Other | Admitting: *Deleted

## 2015-09-09 DIAGNOSIS — M810 Age-related osteoporosis without current pathological fracture: Secondary | ICD-10-CM | POA: Diagnosis not present

## 2015-09-09 MED ORDER — DENOSUMAB 60 MG/ML ~~LOC~~ SOLN
60.0000 mg | Freq: Once | SUBCUTANEOUS | Status: AC
  Administered 2015-09-09: 60 mg via SUBCUTANEOUS

## 2015-09-15 DIAGNOSIS — S8002XD Contusion of left knee, subsequent encounter: Secondary | ICD-10-CM | POA: Diagnosis not present

## 2015-10-04 ENCOUNTER — Other Ambulatory Visit: Payer: Self-pay | Admitting: Family Medicine

## 2015-10-05 ENCOUNTER — Telehealth: Payer: Self-pay | Admitting: Family Medicine

## 2015-10-05 NOTE — Telephone Encounter (Signed)
Patient Name: [name] DOB: 02-15-1923 Initial Comment Caller states she has runny nose and nasal drip Nurse Assessment Nurse: Roosvelt Maser, RN, Barnetta Chapel Date/Time (Eastern Time): 10/05/2015 10:36:05 AM Confirm and document reason for call. If symptomatic, describe symptoms. You must click the next button to save text entered. ---caller states she has nasal drip down the back of her throat that started friday and nasal congestion. no fever, no cough. Has the patient traveled out of the country within the last 30 days? ---Not Applicable Does the patient have any new or worsening symptoms? ---Yes Will a triage be completed? ---Yes Related visit to physician within the last 2 weeks? ---N/A Does the PT have any chronic conditions? (i.e. diabetes, asthma, etc.) ---Yes List chronic conditions. ---DM seasonal allergies. Is this a behavioral health or substance abuse call? ---No Guidelines Guideline Title Affirmed Question Affirmed Notes Nasal Allergies (Hay Fever) [1] Nasal allergies AND [2] year-round symptoms Final Disposition User See PCP within 2 Lurlean Nanny, RN, Barnetta Chapel Referrals REFERRED TO PCP OFFICE Disagree/Comply: [name]

## 2015-10-05 NOTE — Telephone Encounter (Signed)
TH scheduled appt on 10/06/15 with Avie Echevaria NP.

## 2015-10-06 ENCOUNTER — Ambulatory Visit (INDEPENDENT_AMBULATORY_CARE_PROVIDER_SITE_OTHER): Payer: Medicare Other | Admitting: Internal Medicine

## 2015-10-06 ENCOUNTER — Encounter: Payer: Self-pay | Admitting: Internal Medicine

## 2015-10-06 ENCOUNTER — Ambulatory Visit: Payer: Self-pay | Admitting: Internal Medicine

## 2015-10-06 VITALS — BP 126/84 | HR 76 | Temp 97.5°F | Resp 20 | Wt 131.0 lb

## 2015-10-06 DIAGNOSIS — J069 Acute upper respiratory infection, unspecified: Secondary | ICD-10-CM

## 2015-10-06 NOTE — Assessment & Plan Note (Signed)
Discussed viral etiology Tylenol, rest If worse next week, she should call

## 2015-10-06 NOTE — Patient Instructions (Signed)
Please try tylenol and plenty of rest. Call next week if you are getting worse

## 2015-10-06 NOTE — Progress Notes (Signed)
Subjective:    Patient ID: [name], female    DOB: 01/17/1923, 82 y.o.   MRN: [id number]  HPI Here due to respiratory infection  Started about 5 days ago Pain around eyes and nasal stuffiness Hasn't felt well in general Eyes are better now Lots of thick drainage from nose--this seems better also Still has right frontal pain Now with sore throat since yesterday  No fever No sweats or chills  No SOB No ear pain  Tried zyrtec-- ?helped the congestion No other meds  Current Outpatient Prescriptions on File Prior to Visit  Medication Sig Dispense Refill  . aspirin EC 81 MG tablet Take 40.5 mg by mouth at bedtime.     . Blood Glucose Monitoring Suppl (ACCU-CHEK AVIVA PLUS) W/DEVICE KIT Use to check blood sugar daily as needed.  Dx: E11.9 1 kit 0  . Calcium Citrate (CITRACAL PO) Take 1 capsule by mouth daily.     . fish oil-omega-3 fatty acids 1000 MG capsule Take 1 g by mouth daily.      . fluticasone (FLONASE) 50 MCG/ACT nasal spray PLACE 2 SPRAYS INTO BOTH NOSTRILS DAILY. 16 g 3  . glucose blood (ACCU-CHEK AVIVA PLUS) test strip Use to check blood sugar daily as needed.  Dx: E11.9 100 each 3  . Lancets (FREESTYLE) lancets Use to check blood sugar once daily.  Dx: E11.9 100 each 3  . metFORMIN (GLUCOPHAGE) 500 MG tablet Take 0.5 tablets (250 mg total) by mouth daily with breakfast. (Patient taking differently: Take 500 mg by mouth daily with breakfast. ) 45 tablet 1  . Multiple Vitamin (MULTIVITAMIN) tablet Take 1 tablet by mouth.    Marland Kitchen NEXIUM 40 MG capsule TAKE 1 CAPSULE (40 MG TOTAL) BY MOUTH DAILY AT 12 NOON. 30 capsule 5  . Polyethyl Glycol-Propyl Glycol (SYSTANE OP) Apply 1 drop to eye daily as needed (for dry eyes).    . ramipril (ALTACE) 5 MG capsule TAKE 1 CAPSULE (5 MG TOTAL) BY MOUTH DAILY. 90 capsule 1   No current facility-administered medications on file prior to visit.    Allergies  Allergen Reactions  . Atorvastatin     REACTION: intolerant  . Codeine      REACTION: GI upset  . Rosuvastatin     REACTION: intolerant    Past Medical History  Diagnosis Date  . Hyperlipidemia   . Hypertension   . Diabetes mellitus     Type II  . GERD (gastroesophageal reflux disease)   . Allergy   . Syncope and collapse     1 episode  . Orthostatic hypotension 04/2006    Hospital  . Palpitations   . Acute pharyngitis   . Other abscess of vulva   . Acute sinusitis, unspecified   . Acute upper respiratory infections of unspecified site   . Hemangioma of unspecified site   . Anal and rectal polyp   . Cataract     History of  . Osteopenia   . Osteoarthritis   . Gout   . Cavernous angioma   . Degenerative cervical disc 02/2006    Past Surgical History  Procedure Laterality Date  . Abdominal hysterectomy    . Cataract extraction    . Cavernous hemangioma  04/2004    Hospital  . Cavernous angioma    . Aortic dopplers      Mild plaque.  EEG okay  . Total knee arthroplasty      Right    Family History  Problem  Relation Age of Onset  . Cancer Maternal Aunt     Breast CA  . Heart disease Other     CHF    Social History   Social History  . Marital Status: Married    Spouse Name: N/A  . Number of Children: 3  . Years of Education: N/A   Occupational History  . 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 Smoking status: Former Research scientist (life sciences)  . Smokeless tobacco: Never Used     Comment: quit 1970  . Alcohol Use: No  . Drug Use: No  . Sexual Activity: Not on file   Other Topics Concern  . Not on file   Social History Narrative   Diet: fruit and veggies, water, lean protein.   Activity limited secondary to knee discomfort/arthritis, but going to church exersice group two times a week.                Review of Systems Daughter due for hysterectomy in 12 days--wants to be able to help No rash No vomiting or diarrhea Appetite is okay Throat sensitive to pepper in the past 2  weeks Indigestion ---uses nexium    Objective:   Physical Exam  Constitutional: She appears well-developed. No distress.  HENT:  No sinus tenderness Mild pale nasal congestion ?slight pharyngeal injection Left TM blocked, right TM normal  Neck: Normal range of motion. Neck supple.  Pulmonary/Chest: Effort normal and breath sounds normal. No respiratory distress. She has no wheezes. She has no rales.  Lymphadenopathy:    She has no cervical adenopathy.          Assessment & Plan:

## 2015-10-06 NOTE — Progress Notes (Signed)
Pre visit review using our clinic review tool, if applicable. No additional management support is needed unless otherwise documented below in the visit note. 

## 2015-10-12 ENCOUNTER — Ambulatory Visit (INDEPENDENT_AMBULATORY_CARE_PROVIDER_SITE_OTHER): Payer: Medicare Other | Admitting: Family Medicine

## 2015-10-12 ENCOUNTER — Encounter: Payer: Self-pay | Admitting: Family Medicine

## 2015-10-12 VITALS — BP 131/74 | HR 80 | Temp 98.1°F | Ht 63.0 in | Wt 131.5 lb

## 2015-10-12 DIAGNOSIS — J069 Acute upper respiratory infection, unspecified: Secondary | ICD-10-CM

## 2015-10-12 NOTE — Patient Instructions (Signed)
[facility] Low Glycemic Diet (Source: [facility], 2006)  Low Glycemic Foods (20-49) (Decrease risk of developing heart disease)  Best for Diabetes: Eat Mostly these  Breakfast Cereals: All-Bran All-Bran Fruit 'n [name] Fiber One Oatmeal (not instant) Oat bran  Fruits and fruit juices: (Limit to 1-2 servings per day) Apples Apricots (fresh & dried) Blackberries Blueberries Cherries Cranberries Peaches Pears Plums Prunes Grapefruit Raspberries Strawberries Tangerine  Juices: Apple juice Grapefruit juice Tomato juice  Beans and legumes (fresh-cooked): Black-eyed peas Butter beans Chick peas Lentils  Green beans Lima beans Kidney beans Navy beans Pinto beans Snow peas  Non-starchy vegetables: Asparagus, avocado, broccoli, cabbage, cauliflower, celery, cucumber, greens, lettuce, mushrooms, peppers, tomatoes, okra, onions, spinach, summer squash  Grains: Barley Bulgur Rye Wild rice  Nuts and oils : Almonds Peanuts Sunflower seeds Hazelnuts Pecans Walnuts Oils that are liquid at room temperature  Dairy, fish, meat, soy, and eggs: Milk, skim Lowfat cheese Yogurt, lowfat, fruit sugar sweetened Lean red meat Fish  Skinless chicken & turkey Shellfish Egg whites (up to 3 daily) Soy products  Egg yolks (up to 7 or _____ per week) Moderate Glycemic Foods (50-69)  OK sometimes with diabetes  Breakfast Cereals: Bran Buds Bran Chex Just Right Mini-Wheats  Special K Swiss muesli  Fruits: Banana (under-ripe) Dates Figs Grapes Kiwi Mango Oranges Raisins  Fruit Juices: Cranberry juice Orange juice  Beans and legumes: Boston-type baked beans Canned pinto, kidney, or navy beans Green peas  Vegetables: Beets Carrots  Sweet potato Yam Corn on the cob  Breads: Pita (pocket) bread Oat bran bread Pumpernickel bread Rye bread Wheat bread, high fiber   Grains: Cornmeal Rice, brown Rice, white Couscous  Pasta: Macaroni Pizza, cheese  Ravioli, meat filled Spaghetti, white   Nuts: Cashews Macadamia  Snacks: Chocolate Ice cream, lowfat Muffin Popcorn High Glycemic Foods (70-100)  Rare: Eat occaisionally with diabetes  THESE ARE THE WORST KIND OF FOODS FOR YOUR DIABETES  Breakfast Cereals: Cheerios Corn Chex Corn Flakes Cream of Wheat Grape Nuts Grape Nut Flakes Grits Nutri-Grain Puffed Rice Puffed Wheat Rice Chex Rice Krispies Shredded Wheat Team Total  Fruits: Pineapple Watermelon Banana (over-ripe) Beverages: Sodas, sweet tea, pineapple juice  Vegetables: Potato, baked, boiled, fried, mashed French fries Canned or frozen corn Parsnips Winter squash  Breads: Most breads (white and whole grain) Bagels Bread sticks Bread stuffing Kaiser roll Dinner rolls  Grains: Rice, instant Tapioca, with milk Candy and most cookies  Snacks: Donuts Corn chips Jelly beans Pretzels Pastries     

## 2015-10-12 NOTE — Progress Notes (Signed)
Dr. Frederico Hamman T. Cordarrius Coad, MD, [facility] [place], [address] Phone: [phone] Fax: [phone]  10/12/2015  Patient: [name], MRN: [id number], DOB: [birth year], 82 y.o.  Primary Physician:  Eliezer Lofts, MD   Chief Complaint  Patient presents with  . Scratchy Throat  . Nasal Congestion   Subjective:   [name] is a 82 y.o. very pleasant female patient who presents with the following:  6/14, Dr. Silvio Pate [place]  Son has pancreatic cancer and treatment at [facility].   Son 7/12, will talk to the surgeon and operation after then. Will be coming up until Thursday.   Started to feel poorly on 09/30/2015 - she did go out.  She is a little better.   She started to have a cold about 11 or 12 days ago. She is starting to feel little bit better, quite a bit better, and she is only cost 3 times in total. Her throat is feeling better, and she has a little bit of some concern, because her son is coming to visit her he has pancreatic cancer.  Past Medical History, Surgical History, Social History, Family History, Problem List, Medications, and Allergies have been reviewed and updated if relevant.  Patient Active Problem List   Diagnosis Date Noted  . Viral upper respiratory illness 10/06/2015  . Abdominal pain 08/13/2015  . Near syncope 05/07/2015  . Counseling regarding end of life decision making 01/15/2015  . Insomnia 04/10/2014  . Osteoarthritis of right knee 10/14/2013  . Osteoarthritis of left knee 10/14/2013  . Gout, chronic, without tophus 12/13/2009  . Osteoporosis 10/29/2009  . PALPITATIONS, OCCASIONAL 07/07/2008  . Allergic rhinitis 06/02/2008  . HEMANGIOMA 07/30/2006  . Diabetes mellitus type 2, controlled, without complications ([place]) 14/97/0263  . Hyperlipidemia LDL goal <100 07/30/2006  . Essential hypertension, benign 07/30/2006  . GERD 07/30/2006  . RECTAL POLYPS 07/30/2006  . OSTEOARTHRITIS  07/30/2006  . CATARACT, HX OF 07/30/2006    Past Medical History  Diagnosis Date  . Hyperlipidemia   . Hypertension   . Diabetes mellitus     Type II  . GERD (gastroesophageal reflux disease)   . Allergy   . Syncope and collapse     1 episode  . Orthostatic hypotension 04/2006    Hospital  . Palpitations   . Acute pharyngitis   . Other abscess of vulva   . Acute sinusitis, unspecified   . Acute upper respiratory infections of unspecified site   . Hemangioma of unspecified site   . Anal and rectal polyp   . Cataract     History of  . Osteopenia   . Osteoarthritis   . Gout   . Cavernous angioma   . Degenerative cervical disc 02/2006    Past Surgical History  Procedure Laterality Date  . Abdominal hysterectomy    . Cataract extraction    . Cavernous hemangioma  04/2004    Hospital  . Cavernous angioma    . Aortic dopplers      Mild plaque.  EEG okay  . Total knee arthroplasty      Right    Social History   Social History  . Marital Status: Married    Spouse Name: N/A  . Number of Children: 3  . Years of Education: N/A   Occupational History  . 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 Smoking status: Former Research scientist (life sciences)  .  Smokeless tobacco: Never Used     Comment: quit 1970  . Alcohol Use: No  . Drug Use: No  . Sexual Activity: Not on file   Other Topics Concern  . Not on file   Social History Narrative   Diet: fruit and veggies, water, lean protein.   Activity limited secondary to knee discomfort/arthritis, but going to church exersice group two times a week.                 Family History  Problem Relation Age of Onset  . Cancer Maternal Aunt     Breast CA  . Heart disease Other     CHF    Allergies  Allergen Reactions  . Atorvastatin     REACTION: intolerant  . Codeine     REACTION: GI upset  . Rosuvastatin     REACTION: intolerant    Medication list reviewed and updated in full in [place].  ROS: GEN: Acute illness details above GI: Tolerating PO intake GU: maintaining adequate hydration and urination Pulm: No SOB Interactive and getting along well at home.  Otherwise, ROS is as per the HPI.  Objective:   BP 131/74 mmHg  Pulse 80  Temp(Src) 98.1 F (36.7 C) (Oral)  Ht '5\' 3"'$  (1.6 m)  Wt 131 lb 8 oz (59.648 kg)  BMI 23.30 kg/m2   Gen: WDWN, NAD; A & O x3, cooperative. Pleasant.Globally Non-toxic HEENT: Normocephalic and atraumatic. Throat clear, w/o exudate, R TM clear, L TM - good landmarks, No fluid present. rhinnorhea.  MMM Frontal sinuses: NT Max sinuses: NT NECK: Anterior cervical  LAD is absent CV: RRR, No M/G/R, cap refill <2 sec PULM: Breathing comfortably in no respiratory distress. no wheezing, crackles, rhonchi EXT: No c/c/e PSYCH: Friendly, good eye contact MSK: Nml gait     Laboratory and Imaging Data:  Assessment and Plan:   Viral upper respiratory illness  Reassurance, resolving URI.  Signed,  Maud Deed. Felita Bump, MD   Patient's Medications  New Prescriptions   No medications on file  Previous Medications   ASPIRIN EC 81 MG TABLET    Take 40.5 mg by mouth at bedtime.    BLOOD GLUCOSE MONITORING SUPPL (ACCU-CHEK AVIVA PLUS) W/DEVICE KIT    Use to check blood sugar daily as needed.  Dx: E11.9   CALCIUM CITRATE (CITRACAL PO)    Take 1 capsule by mouth daily.    FISH OIL-OMEGA-3 FATTY ACIDS 1000 MG CAPSULE    Take 1 g by mouth daily.     FLUTICASONE (FLONASE) 50 MCG/ACT NASAL SPRAY    PLACE 2 SPRAYS INTO BOTH NOSTRILS DAILY.   GLUCOSE BLOOD (ACCU-CHEK AVIVA PLUS) TEST STRIP    Use to check blood sugar daily as needed.  Dx: E11.9   LANCETS (FREESTYLE) LANCETS    Use to check blood sugar once daily.  Dx: E11.9   METFORMIN (GLUCOPHAGE) 500 MG TABLET    Take 0.5 tablets (250 mg total) by mouth daily with breakfast.   MULTIPLE VITAMIN (MULTIVITAMIN) TABLET    Take 1 tablet by mouth.   NEXIUM 40 MG CAPSULE    TAKE 1 CAPSULE (40  MG TOTAL) BY MOUTH DAILY AT 12 NOON.   POLYETHYL GLYCOL-PROPYL GLYCOL (SYSTANE OP)    Apply 1 drop to eye daily as needed (for dry eyes).   RAMIPRIL (ALTACE) 5 MG CAPSULE    TAKE 1 CAPSULE (5 MG TOTAL) BY MOUTH DAILY.  Modified Medications   No medications on  file  Discontinued Medications   No medications on file

## 2015-10-12 NOTE — Progress Notes (Signed)
Pre visit review using our clinic review tool, if applicable. No additional management support is needed unless otherwise documented below in the visit note. 

## 2015-10-13 IMAGING — CT CT CERVICAL SPINE W/O CM
4 of 6 series · 13 of 33 positions shown, 15 images · non-contrast
Comparison: 02/13/2009

CLINICAL DATA: HEAD INJURY Post fall

EXAM:
CT HEAD WITHOUT CONTRAST
CT CERVICAL SPINE WITHOUT CONTRAST
TECHNIQUE: Multidetector CT imaging of the head and cervical spine was
performed following the standard protocol without intravenous
contrast. Multiplanar CT image reconstructions of the cervical spine
were also generated.

[Series 5: c_spine 2.0 i40s 3 · axial · 0.24mm/px · z∈[-156,-102]mm · 2 of 83 slices shown]
[im 28/83  bone]
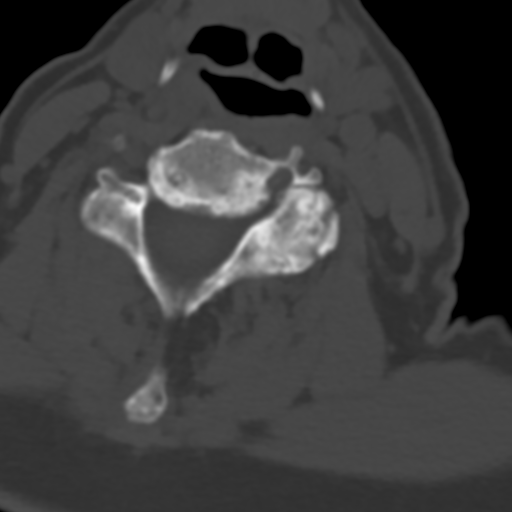
[im 55/83  bone]
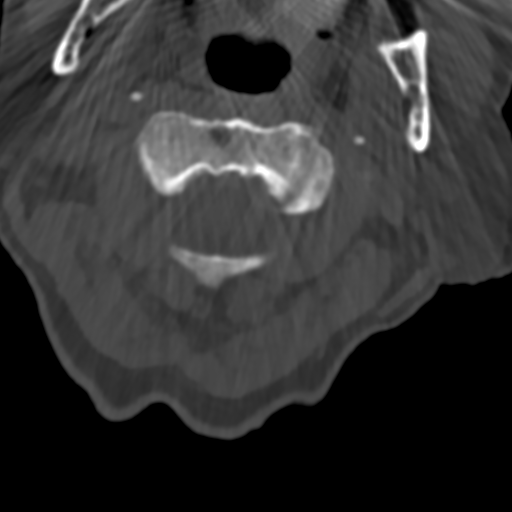

[Series 7: coronals · coronal · 0.27mm/px · 3 of 42 slices shown]
[im 9/42  bone]
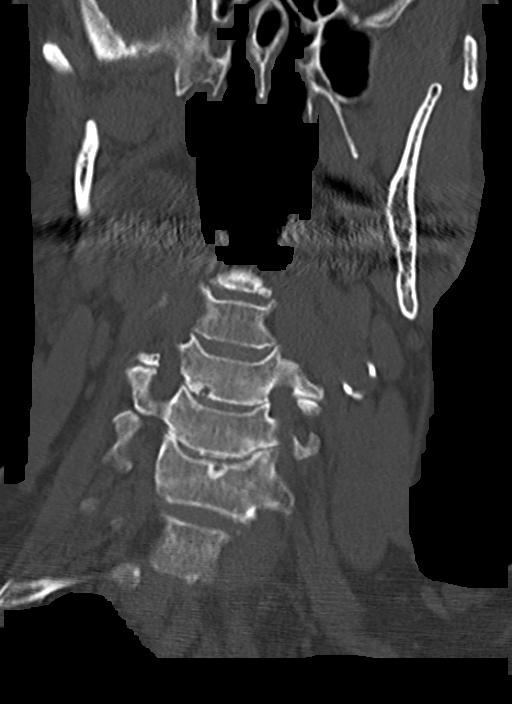
[im 17/42  bone]
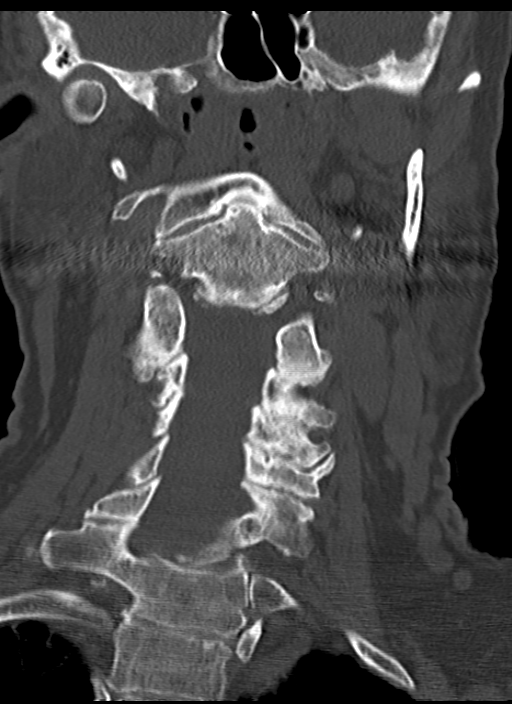
[im 25/42  bone]
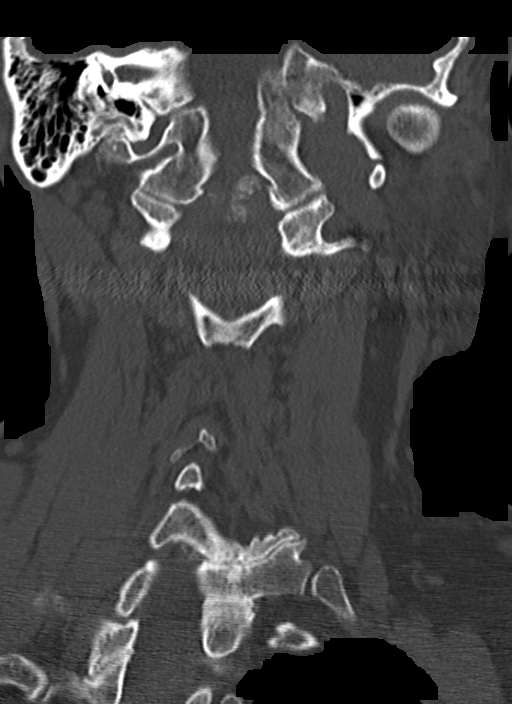

[Series 8: sagittals · sagittal · 0.28mm/px · 5 of 48 slices shown, 6 images]
[im 16/48  bone]
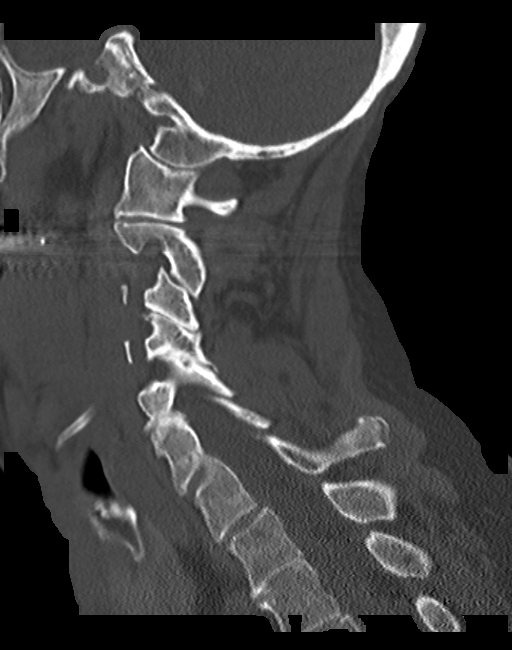
[im 20/48  bone]
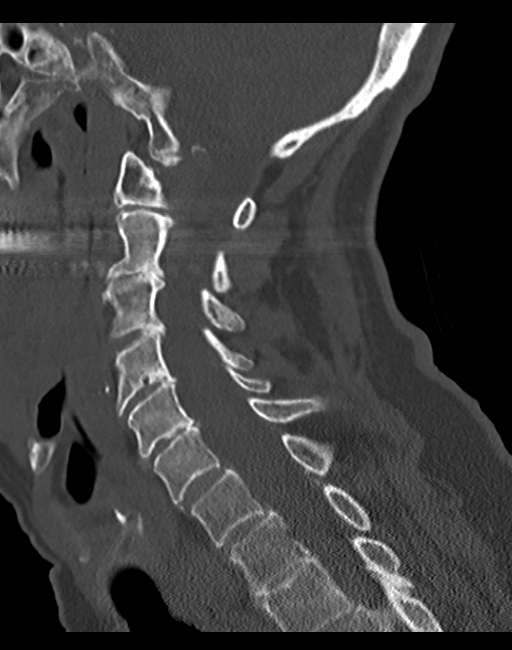
[im 24/48  soft-tissue]
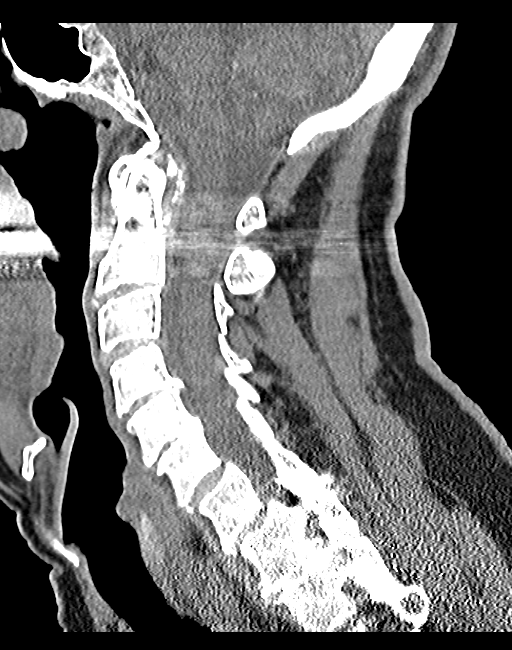
[im 24/48  bone]
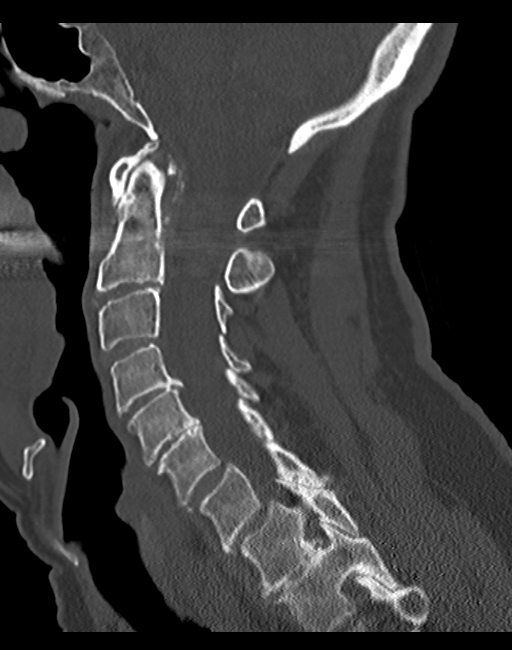
[im 28/48  bone]
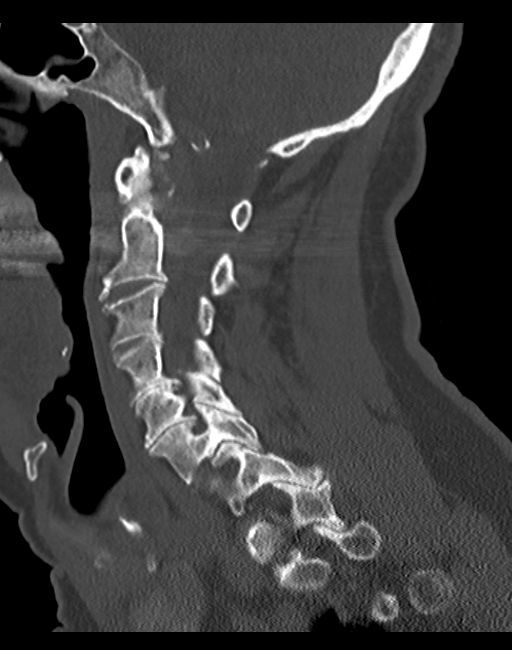
[im 32/48  bone]
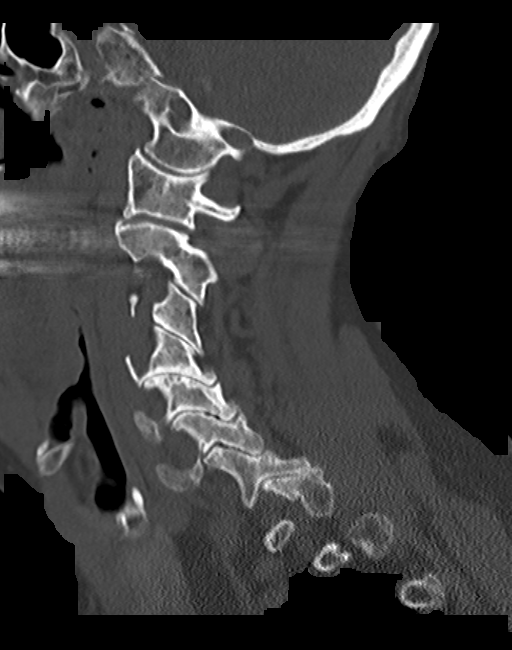

[Series 9: orthogonals · axial · 0.28mm/px · z∈[-193,-110]mm · 3 of 90 slices shown, 4 images]
[im 23/90  soft-tissue]
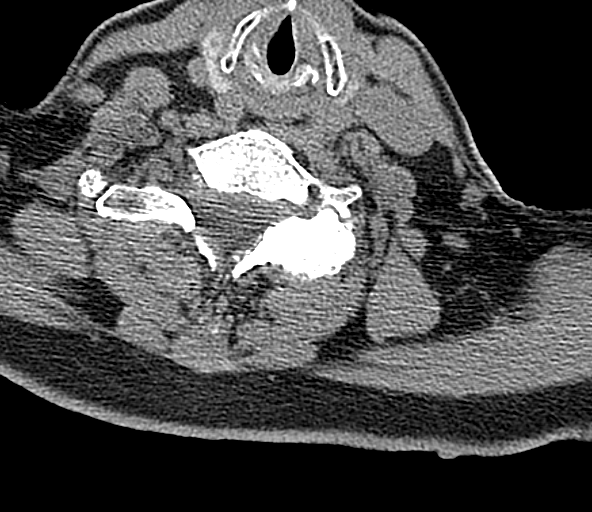
[im 23/90  bone]
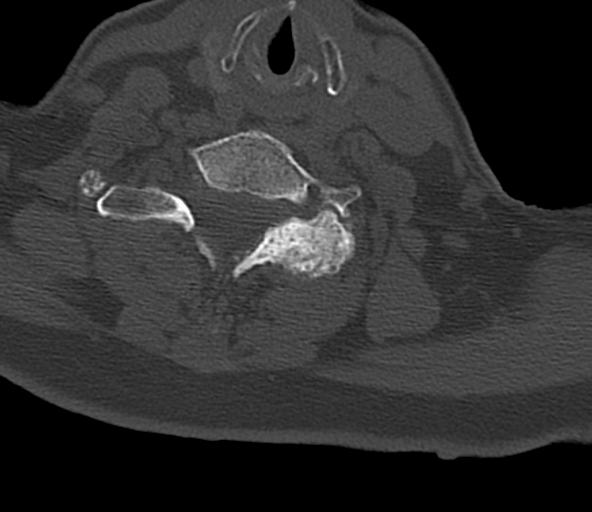
[im 45/90  bone]
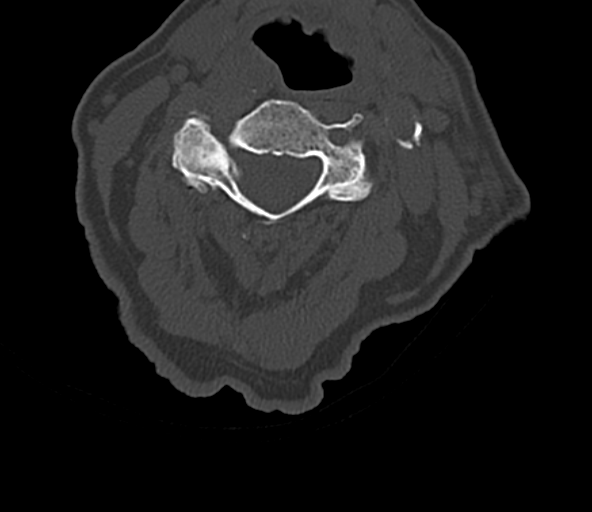
[im 67/90  bone]
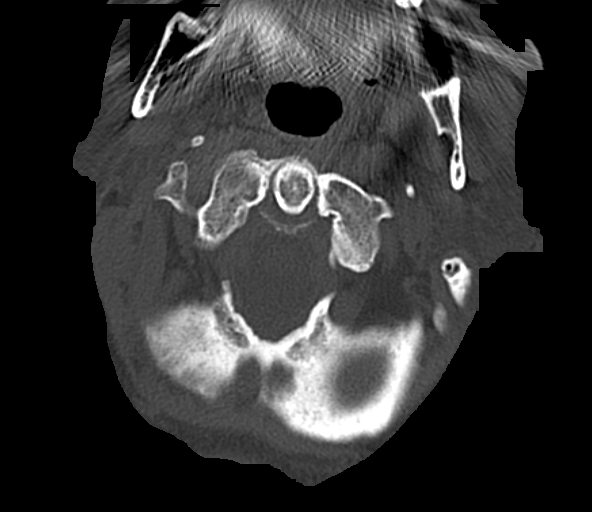

[13 of 33 positions shown; findings below may reference images not displayed]

FINDINGS: CT HEAD FINDINGS

Atherosclerotic and physiologic intracranial calcifications.
Progressive coarse calcification in the medulla to the right of
midline. Mild atrophy. There is no evidence of acute intracranial
hemorrhage, brain edema, mass lesion, acute infarction, mass effect,
or midline shift. Acute infarct may be inapparent on noncontrast CT.
No other intra-axial abnormalities are seen, and the ventricles and
sulci are within normal limits in size and symmetry. No abnormal
extra-axial fluid collections or masses are identified. No
significant calvarial abnormality.

CT CERVICAL SPINE FINDINGS

Normal alignment. No prevertebral soft tissue swelling. Mild
narrowing of interspaces C2-C6 with small endplate spurs and disc
bulges. Facets are seated with asymmetric DJD right C3-4, left C4-7
with foraminal encroachment. Negative for fracture. Visualized lung
apices clear. Bilateral calcified carotid bifurcation plaque.
IMPRESSION: 1. Negative for bleed or other acute intracranial process.
2. Negative for cervical fracture or other acute abnormality.
3. Multilevel spondylitic changes as above.

## 2015-10-24 ENCOUNTER — Other Ambulatory Visit: Payer: Self-pay | Admitting: Family Medicine

## 2015-10-24 DIAGNOSIS — E119 Type 2 diabetes mellitus without complications: Secondary | ICD-10-CM

## 2015-11-02 ENCOUNTER — Ambulatory Visit: Payer: Medicare Other | Admitting: Podiatry

## 2015-11-02 ENCOUNTER — Encounter: Payer: Self-pay | Admitting: Podiatry

## 2015-11-02 DIAGNOSIS — B351 Tinea unguium: Secondary | ICD-10-CM

## 2015-11-02 DIAGNOSIS — M79676 Pain in unspecified toe(s): Secondary | ICD-10-CM

## 2015-11-02 NOTE — Progress Notes (Signed)
Patient ID: [name], female   DOB: October 28, 1922, 82 y.o.   MRN: [id number]  Subjective: This patient presents today for scheduled visit complaining of thickened and elongated toenails are comfortable walking wearing shoes and requests toenail debridement. Patient has a history of cellulitis that we have treated with oral antibiotics. Patient describes visit to primary care physician for reoccurring swelling in the dorsal left foot which was diagnosed as cellulitis and was treated with doxycycline. The symptoms have resolved from that treatment  Objective Orientated 3 Mild nonpitting edema dorsal left foot without any erythema, warmth No open skin lesions bilaterally The toenails are brittle, deformed, hypertrophic and tender to palpation 6-10  Assessment: No clinical sign of infection dorsal on the dorsal aspect left foot Residual edema dorsal left foot Diabetic Symptomatic Mycotic toenails 6-10

## 2015-11-02 NOTE — Patient Instructions (Signed)
Diabetes and Foot Care Diabetes may cause you to have problems because of poor blood supply (circulation) to your feet and legs. This may cause the skin on your feet to become thinner, break easier, and heal more slowly. Your skin may become dry, and the skin may peel and crack. You may also have nerve damage in your legs and feet causing decreased feeling in them. You may not notice minor injuries to your feet that could lead to infections or more serious problems. Taking care of your feet is one of the most important things you can do for yourself.  HOME CARE INSTRUCTIONS  Wear shoes at all times, even in the house. Do not go barefoot. Bare feet are easily injured.  Check your feet daily for blisters, cuts, and redness. If you cannot see the bottom of your feet, use a mirror or ask someone for help.  Wash your feet with warm water (do not use hot water) and mild soap. Then pat your feet and the areas between your toes until they are completely dry. Do not soak your feet as this can dry your skin.  Apply a moisturizing lotion or petroleum jelly (that does not contain alcohol and is unscented) to the skin on your feet and to dry, brittle toenails. Do not apply lotion between your toes.  Trim your toenails straight across. Do not dig under them or around the cuticle. File the edges of your nails with an emery board or nail file.  Do not cut corns or calluses or try to remove them with medicine.  Wear clean socks or stockings every day. Make sure they are not too tight. Do not wear knee-high stockings since they may decrease blood flow to your legs.  Wear shoes that fit properly and have enough cushioning. To break in new shoes, wear them for just a few hours a day. This prevents you from injuring your feet. Always look in your shoes before you put them on to be sure there are no objects inside.  Do not cross your legs. This may decrease the blood flow to your feet.  If you find a minor scrape,  cut, or break in the skin on your feet, keep it and the skin around it clean and dry. These areas may be cleansed with mild soap and water. Do not cleanse the area with peroxide, alcohol, or iodine.  When you remove an adhesive bandage, be sure not to damage the skin around it.  If you have a wound, look at it several times a day to make sure it is healing.  Do not use heating pads or hot water bottles. They may burn your skin. If you have lost feeling in your feet or legs, you may not know it is happening until it is too late.  Make sure your health care provider performs a complete foot exam at least annually or more often if you have foot problems. Report any cuts, sores, or bruises to your health care provider immediately. SEEK MEDICAL CARE IF:   You have an injury that is not healing.  You have cuts or breaks in the skin.  You have an ingrown nail.  You notice redness on your legs or feet.  You feel burning or tingling in your legs or feet.  You have pain or cramps in your legs and feet.  Your legs or feet are numb.  Your feet always feel cold. SEEK IMMEDIATE MEDICAL CARE IF:   There is increasing redness,   swelling, or pain in or around a wound.  There is a red line that goes up your leg.  Pus is coming from a wound.  You develop a fever or as directed by your health care provider.  You notice a bad smell coming from an ulcer or wound.   This information is not intended to replace advice given to you by your health care provider. Make sure you discuss any questions you have with your health care provider.   Document Released: 04/07/2000 Document Revised: 12/11/2012 Document Reviewed: 09/17/2012 Elsevier Interactive Patient Education 2016 [employer].  

## 2015-11-09 ENCOUNTER — Other Ambulatory Visit: Payer: Self-pay | Admitting: Family Medicine

## 2015-11-10 ENCOUNTER — Telehealth: Payer: Self-pay

## 2015-11-10 NOTE — Telephone Encounter (Signed)
Pt left v/m; pt tried to pick up refill for metformin and pt was advised too early; pt was told cannot pick up until 11/24/15; pt alternates taking metformin 1/2 daily and 1 daily. On med list pt should take 1/2 tab daily. Pt has 2 pills left and wants to pick up rx. Pt wants to know what to do. Please advise.

## 2015-11-10 NOTE — Telephone Encounter (Addendum)
I spoke with Ms. Yokota and advised that we only have her taking 1/2 tablet of Metformin 500 mg daily.  She states when she thinks her blood sugars are high, then she will take a whole tablet.  I advised Ms. Metzen to only take 1/2 tablet daily.  If she feels her blood sugars are running high she needs to call and let us know before self adjusting her medication.  She states she will only take 1/2 tablet daily from here on out.  She also states she spoke with the pharmacy and they are going to let her pick up her prescription today.  She wanted Dr. Diona Browner to know she is also trying to stop the Nexium due to reading the side effects on this medication.  She has not taken it for two days and so far has done okay without it. She also wanted to make sure it was okay for her to continue taking ibuprofen 200 mg one tablet three times a day for her knee pain.  Advised okay to continue with ibuprofen.  Advised she could even take two tablets if needed but she states that just taking the one tablet seems to take care of her pain.

## 2015-11-22 DIAGNOSIS — Z961 Presence of intraocular lens: Secondary | ICD-10-CM | POA: Diagnosis not present

## 2015-11-22 DIAGNOSIS — H35033 Hypertensive retinopathy, bilateral: Secondary | ICD-10-CM | POA: Diagnosis not present

## 2015-11-22 DIAGNOSIS — E119 Type 2 diabetes mellitus without complications: Secondary | ICD-10-CM | POA: Diagnosis not present

## 2015-11-22 DIAGNOSIS — H33322 Round hole, left eye: Secondary | ICD-10-CM | POA: Diagnosis not present

## 2015-11-22 LAB — HM DIABETES EYE EXAM

## 2015-11-23 ENCOUNTER — Encounter: Payer: Self-pay | Admitting: Family Medicine

## 2016-01-03 ENCOUNTER — Other Ambulatory Visit (INDEPENDENT_AMBULATORY_CARE_PROVIDER_SITE_OTHER): Payer: Medicare Other

## 2016-01-03 ENCOUNTER — Telehealth: Payer: Self-pay | Admitting: Family Medicine

## 2016-01-03 DIAGNOSIS — E785 Hyperlipidemia, unspecified: Secondary | ICD-10-CM

## 2016-01-03 DIAGNOSIS — M810 Age-related osteoporosis without current pathological fracture: Secondary | ICD-10-CM

## 2016-01-03 DIAGNOSIS — E119 Type 2 diabetes mellitus without complications: Secondary | ICD-10-CM

## 2016-01-03 DIAGNOSIS — M1A9XX0 Chronic gout, unspecified, without tophus (tophi): Secondary | ICD-10-CM

## 2016-01-03 NOTE — Telephone Encounter (Signed)
-----   Message from Ellamae Sia sent at 12/29/2015  5:43 PM EDT ----- Regarding: Lab orders for Tuesday, 9.12.17  AWV lab orders, please.

## 2016-01-04 ENCOUNTER — Other Ambulatory Visit: Payer: Medicare Other

## 2016-01-04 LAB — COMPREHENSIVE METABOLIC PANEL
ALT: 15 U/L (ref 0–35)
AST: 21 U/L (ref 0–37)
Albumin: 3.9 g/dL (ref 3.5–5.2)
Alkaline Phosphatase: 38 U/L — ABNORMAL LOW (ref 39–117)
BILIRUBIN TOTAL: 0.4 mg/dL (ref 0.2–1.2)
BUN: 23 mg/dL (ref 6–23)
CALCIUM: 9.8 mg/dL (ref 8.4–10.5)
CHLORIDE: 106 meq/L (ref 96–112)
CO2: 28 meq/L (ref 19–32)
CREATININE: 1.08 mg/dL (ref 0.40–1.20)
GFR: 50.54 mL/min — ABNORMAL LOW (ref >60.00)
GLUCOSE: 71 mg/dL (ref 70–99)
Potassium: 4.4 mEq/L (ref 3.5–5.1)
Sodium: 139 mEq/L (ref 135–145)
Total Protein: 7.1 g/dL (ref 6.0–8.3)

## 2016-01-04 LAB — LIPID PANEL
CHOLESTEROL: 180 mg/dL (ref 0–200)
HDL: 53.1 mg/dL (ref >39.00)
LDL CALC: 105 mg/dL — AB (ref 0–99)
NonHDL: 127.16
Total CHOL/HDL Ratio: 3
Triglycerides: 113 mg/dL (ref 0.0–149.0)
VLDL: 22.6 mg/dL (ref 0.0–40.0)

## 2016-01-04 LAB — HEMOGLOBIN A1C: Hgb A1c MFr Bld: 5.9 % (ref 4.6–6.5)

## 2016-01-04 LAB — VITAMIN D 25 HYDROXY (VIT D DEFICIENCY, FRACTURES): VITD: 36.62 ng/mL (ref 30.00–100.00)

## 2016-01-11 ENCOUNTER — Telehealth: Payer: Self-pay | Admitting: Family Medicine

## 2016-01-11 ENCOUNTER — Encounter: Payer: Self-pay | Admitting: Family Medicine

## 2016-01-11 ENCOUNTER — Ambulatory Visit: Payer: Medicare Other | Admitting: Family Medicine

## 2016-01-11 ENCOUNTER — Ambulatory Visit (INDEPENDENT_AMBULATORY_CARE_PROVIDER_SITE_OTHER): Payer: Medicare Other | Admitting: Family Medicine

## 2016-01-11 VITALS — BP 150/64 | HR 80 | Temp 98.2°F | Resp 16 | Wt 133.0 lb

## 2016-01-11 DIAGNOSIS — K219 Gastro-esophageal reflux disease without esophagitis: Secondary | ICD-10-CM | POA: Diagnosis not present

## 2016-01-11 DIAGNOSIS — R531 Weakness: Secondary | ICD-10-CM | POA: Diagnosis not present

## 2016-01-11 DIAGNOSIS — E119 Type 2 diabetes mellitus without complications: Secondary | ICD-10-CM

## 2016-01-11 DIAGNOSIS — I10 Essential (primary) hypertension: Secondary | ICD-10-CM | POA: Diagnosis not present

## 2016-01-11 DIAGNOSIS — L989 Disorder of the skin and subcutaneous tissue, unspecified: Secondary | ICD-10-CM

## 2016-01-11 DIAGNOSIS — R42 Dizziness and giddiness: Secondary | ICD-10-CM | POA: Diagnosis not present

## 2016-01-11 LAB — POC URINALSYSI DIPSTICK (AUTOMATED)
BILIRUBIN UA: NEGATIVE
Glucose, UA: NEGATIVE
KETONES UA: NEGATIVE
Leukocytes, UA: NEGATIVE
Nitrite, UA: NEGATIVE
PH UA: 6
PROTEIN UA: NEGATIVE
RBC UA: NEGATIVE
SPEC GRAV UA: 1.025
Urobilinogen, UA: 0.2

## 2016-01-11 LAB — POCT CBG (FASTING - GLUCOSE)-MANUAL ENTRY: Glucose Fasting, POC: 116 mg/dL — AB (ref 70–99)

## 2016-01-11 NOTE — Progress Notes (Signed)
Subjective:    Patient ID: [name], female    DOB: 1922-06-29, 82 y.o.   MRN: [id number]  HPI   82 year old female with DM, HTN presents for leg shakiness, weakness and acute difficulty seeing while driving to the appointment today.   She reports she was doing well until earlier today.  She went on a 30 min walk out in the sun. Ate breakfast, drinking water but a little less today than usual. Before left for appt felt weak and shaky. No CP , no SOB.  no headache. She felt dizzy in car and sign on road were blurry. No new numbness.  After arriving at office, sitting in air conditioning, resting.. Felt better. Now feeling normal.   Glucose  nml And UA clear  in office. BP Readings from Last 3 Encounters:  01/11/16 (!) 150/64  10/12/15 131/74  10/06/15 126/84  Not checking BP at home. No new medicines.  Occ sore throat, reflux once or twice a day, mild.  Right lower leg with red area, not irritated.  Review of Systems  Constitutional: Positive for fatigue. Negative for fever.  HENT: Negative for ear pain.   Eyes: Negative for pain.  Respiratory: Negative for shortness of breath and wheezing.   Cardiovascular: Negative for chest pain, palpitations and leg swelling.  Gastrointestinal: Negative for abdominal distention.       Objective:   Physical Exam  Constitutional: She is oriented to person, place, and time. Vital signs are normal. She appears well-developed and well-nourished. She is cooperative.  Non-toxic appearance. She does not appear ill. No distress.  HENT:  Head: Normocephalic.  Right Ear: Hearing, tympanic membrane, external ear and ear canal normal. Tympanic membrane is not erythematous, not retracted and not bulging.  Left Ear: Hearing, tympanic membrane, external ear and ear canal normal. Tympanic membrane is not erythematous, not retracted and not bulging.  Nose: No mucosal edema or rhinorrhea. Right sinus exhibits no maxillary sinus tenderness  and no frontal sinus tenderness. Left sinus exhibits no maxillary sinus tenderness and no frontal sinus tenderness.  Mouth/Throat: Uvula is midline, oropharynx is clear and moist and mucous membranes are normal.  Eyes: Conjunctivae, EOM and lids are normal. Pupils are equal, round, and reactive to light. Lids are everted and swept, no foreign bodies found.  Neck: Trachea normal and normal range of motion. Neck supple. Carotid bruit is not present. No thyroid mass and no thyromegaly present.  Cardiovascular: Normal rate, regular rhythm, S1 normal, S2 normal, normal heart sounds, intact distal pulses and normal pulses.  Exam reveals no gallop and no friction rub.   No murmur heard. Pulmonary/Chest: Effort normal and breath sounds normal. No tachypnea. No respiratory distress. She has no decreased breath sounds. She has no wheezes. She has no rhonchi. She has no rales.  Abdominal: Soft. Normal appearance and bowel sounds are normal. There is no tenderness.  Neurological: She is alert and oriented to person, place, and time. She has normal strength and normal reflexes. No cranial nerve deficit or sensory deficit. She exhibits normal muscle tone. She displays a negative Romberg sign. Coordination and gait normal. GCS eye subscore is 4. GCS verbal subscore is 5. GCS motor subscore is 6.  Nml cerebellar exam   No papilledema  Skin: Skin is warm, dry and intact. No rash noted.  Psychiatric: She has a normal mood and affect. Her speech is normal and behavior is normal. Judgment and thought content normal. Her mood appears not anxious. Cognition  and memory are normal. Cognition and memory are not impaired. She does not exhibit a depressed mood. She exhibits normal recent memory and normal remote memory.    Miltiple SKs on bilateral legs and torso.  2 erythematous irregular macules on right inner calf.       Assessment & Plan:

## 2016-01-11 NOTE — Telephone Encounter (Signed)
Call pt.. Forgot to remind her to have her take her blood sugar measurements 2 times daily ( FBS and 2hr PP) to make sure not running low given recent weakness.  Bring measurements to next OV  Or call if measurements < 60.

## 2016-01-11 NOTE — Assessment & Plan Note (Signed)
Lab Results  Component Value Date   HGBA1C 5.9 01/03/2016    CBG low normal on recent labs.  ? If metformin over treating DM.  Pt to follow CGBs at home and call if lows.. Will re-eval at upcoming OV in 1 month.

## 2016-01-11 NOTE — Assessment & Plan Note (Signed)
Decrease acidic triggers. Add ranitidine to regimen prn.

## 2016-01-11 NOTE — Assessment & Plan Note (Signed)
Well controlled. Continue current medication. No S/S of orthostasis.

## 2016-01-11 NOTE — Progress Notes (Signed)
Pre visit review using our clinic review tool, if applicable. No additional management support is needed unless otherwise documented below in the visit note. 

## 2016-01-11 NOTE — Patient Instructions (Addendum)
Continue nexium.  If heartburn can add ranitidine ( zantacOTC)  Can get glucose tablets for emergency low sugars.  Drink water as able.  Avoid heat, walk on cooler parts of the day.  Cortisone 10 twice daily on leg lesion x 2 weeks.

## 2016-01-11 NOTE — Assessment & Plan Note (Signed)
?   Atypical SK versus AK versus dry skin patch.  Treat with topical steroid OTC.. If not improving consider derm eval.

## 2016-01-11 NOTE — Assessment & Plan Note (Signed)
Likely multifactorial related to overheating walking in sun and decreased po intake.  Nml CBG in office and UA clear.  Nml neuro exam.

## 2016-01-12 NOTE — Telephone Encounter (Signed)
[name] notified as instructed by telephone.   °

## 2016-01-18 DIAGNOSIS — Z23 Encounter for immunization: Secondary | ICD-10-CM | POA: Diagnosis not present

## 2016-02-01 ENCOUNTER — Ambulatory Visit: Payer: Medicare Other | Admitting: Podiatry

## 2016-02-02 ENCOUNTER — Ambulatory Visit (INDEPENDENT_AMBULATORY_CARE_PROVIDER_SITE_OTHER): Payer: Medicare Other | Admitting: Podiatry

## 2016-02-02 ENCOUNTER — Encounter: Payer: Self-pay | Admitting: Podiatry

## 2016-02-02 VITALS — BP 142/77 | HR 71 | Resp 18

## 2016-02-02 DIAGNOSIS — B351 Tinea unguium: Secondary | ICD-10-CM

## 2016-02-02 DIAGNOSIS — M79676 Pain in unspecified toe(s): Secondary | ICD-10-CM | POA: Diagnosis not present

## 2016-02-02 NOTE — Patient Instructions (Signed)
Remove the Band-Aid in the right big toe in 1-3 days and apply topical antibiotic ointment and a Band-Aid daily until a scab forms  Diabetes and Foot Care Diabetes may cause you to have problems because of poor blood supply (circulation) to your feet and legs. This may cause the skin on your feet to become thinner, break easier, and heal more slowly. Your skin may become dry, and the skin may peel and crack. You may also have nerve damage in your legs and feet causing decreased feeling in them. You may not notice minor injuries to your feet that could lead to infections or more serious problems. Taking care of your feet is one of the most important things you can do for yourself.  HOME CARE INSTRUCTIONS  Wear shoes at all times, even in the house. Do not go barefoot. Bare feet are easily injured.  Check your feet daily for blisters, cuts, and redness. If you cannot see the bottom of your feet, use a mirror or ask someone for help.  Wash your feet with warm water (do not use hot water) and mild soap. Then pat your feet and the areas between your toes until they are completely dry. Do not soak your feet as this can dry your skin.  Apply a moisturizing lotion or petroleum jelly (that does not contain alcohol and is unscented) to the skin on your feet and to dry, brittle toenails. Do not apply lotion between your toes.  Trim your toenails straight across. Do not dig under them or around the cuticle. File the edges of your nails with an emery board or nail file.  Do not cut corns or calluses or try to remove them with medicine.  Wear clean socks or stockings every day. Make sure they are not too tight. Do not wear knee-high stockings since they may decrease blood flow to your legs.  Wear shoes that fit properly and have enough cushioning. To break in new shoes, wear them for just a few hours a day. This prevents you from injuring your feet. Always look in your shoes before you put them on to be sure  there are no objects inside.  Do not cross your legs. This may decrease the blood flow to your feet.  If you find a minor scrape, cut, or break in the skin on your feet, keep it and the skin around it clean and dry. These areas may be cleansed with mild soap and water. Do not cleanse the area with peroxide, alcohol, or iodine.  When you remove an adhesive bandage, be sure not to damage the skin around it.  If you have a wound, look at it several times a day to make sure it is healing.  Do not use heating pads or hot water bottles. They may burn your skin. If you have lost feeling in your feet or legs, you may not know it is happening until it is too late.  Make sure your health care provider performs a complete foot exam at least annually or more often if you have foot problems. Report any cuts, sores, or bruises to your health care provider immediately. SEEK MEDICAL CARE IF:   You have an injury that is not healing.  You have cuts or breaks in the skin.  You have an ingrown nail.  You notice redness on your legs or feet.  You feel burning or tingling in your legs or feet.  You have pain or cramps in your legs and  feet.  Your legs or feet are numb.  Your feet always feel cold. SEEK IMMEDIATE MEDICAL CARE IF:   There is increasing redness, swelling, or pain in or around a wound.  There is a red line that goes up your leg.  Pus is coming from a wound.  You develop a fever or as directed by your health care provider.  You notice a bad smell coming from an ulcer or wound.   This information is not intended to replace advice given to you by your health care provider. Make sure you discuss any questions you have with your health care provider.   Document Released: 04/07/2000 Document Revised: 12/11/2012 Document Reviewed: 09/17/2012 Elsevier Interactive Patient Education [employer].

## 2016-02-03 NOTE — Progress Notes (Signed)
Patient ID: [name], female   DOB: 1923-01-26, 82 y.o.   MRN: [id number]  Subjective: This patient presents today complaining of uncomfortable toenails walking wearing shoes and requests toenail debridement. Patient is known diabetic and does denies any history of amputation, claudication or amputation  Objective: Orientated 3 DP and PT pulses 2/4 bilaterally Capillary reflex immediate bilaterally Sensation to 10 g monofilament wire intact 4/5 right and 5/5 left Vibratory sensation reactive bilaterally Ankle reflex equal and reactive bilaterally No open skin lesions bilaterally The toenails are elongated, discolored, hypertrophic and tender to direct palpation 6-10 HAV bilaterally Hammertoe second bilaterally  Assessment: Diabetic with satisfactory neurovascular status Symptomatic onychomycoses 6-10  Plan: Debridement toenails 6-10 mechanically and electrically. Slight bleeding distal right hallux T with antibiotic ointment and Band-Aid. Patient instructed removed Band-Aid 1-3 days and continue applying topical antibiotic ointment and Band-Aid until a scab forms.  Reappoint 3 months

## 2016-02-07 ENCOUNTER — Telehealth: Payer: Self-pay | Admitting: Family Medicine

## 2016-02-07 NOTE — Telephone Encounter (Signed)
Noted  

## 2016-02-07 NOTE — Telephone Encounter (Signed)
According to her records, [name] is due her Prolia inj next month, so I have electronically submitted pt's info for insurance verification and will notify you once I have a response. Thank you.

## 2016-02-10 ENCOUNTER — Encounter: Payer: Self-pay | Admitting: Family Medicine

## 2016-02-10 ENCOUNTER — Ambulatory Visit (INDEPENDENT_AMBULATORY_CARE_PROVIDER_SITE_OTHER): Payer: Medicare Other | Admitting: Family Medicine

## 2016-02-10 ENCOUNTER — Ambulatory Visit: Payer: Medicare Other

## 2016-02-10 VITALS — BP 133/70 | HR 80 | Temp 97.8°F | Ht 63.0 in | Wt 131.5 lb

## 2016-02-10 DIAGNOSIS — E119 Type 2 diabetes mellitus without complications: Secondary | ICD-10-CM | POA: Diagnosis not present

## 2016-02-10 DIAGNOSIS — M810 Age-related osteoporosis without current pathological fracture: Secondary | ICD-10-CM

## 2016-02-10 DIAGNOSIS — I10 Essential (primary) hypertension: Secondary | ICD-10-CM | POA: Diagnosis not present

## 2016-02-10 DIAGNOSIS — Z0000 Encounter for general adult medical examination without abnormal findings: Secondary | ICD-10-CM

## 2016-02-10 DIAGNOSIS — K219 Gastro-esophageal reflux disease without esophagitis: Secondary | ICD-10-CM | POA: Diagnosis not present

## 2016-02-10 DIAGNOSIS — E785 Hyperlipidemia, unspecified: Secondary | ICD-10-CM

## 2016-02-10 LAB — HM DIABETES FOOT EXAM

## 2016-02-10 MED ORDER — TRIAMCINOLONE ACETONIDE 0.1 % EX CREA: 1.0000 "application " | TOPICAL_CREAM | Freq: Two times a day (BID) | CUTANEOUS | 0 refills | Status: DC

## 2016-02-10 NOTE — Assessment & Plan Note (Signed)
Well controled off nexium. Avoid triggers.

## 2016-02-10 NOTE — Assessment & Plan Note (Signed)
Almost at goal < 100 LDL on no med.

## 2016-02-10 NOTE — Progress Notes (Signed)
Pre visit review using our clinic review tool, if applicable. No additional management support is needed unless otherwise documented below in the visit note. 

## 2016-02-10 NOTE — Patient Instructions (Addendum)
Apply topical steroid prescription cream to are on right inner leg twice daily x 2 weeks. If not gone.. Call for a dermatology appointment.  Stop Metformin.Contniue to follow blood sugars.  Goal FBS < 120, 2 hour after meals < 180.  Avoid acidic foods like tomatos, spicy foods, spaghetti sauce.  Okay to stop nexium as no symptoms. Restart if heartburn returns.  Look into  Audiologist for hearing evaluation.

## 2016-02-10 NOTE — Assessment & Plan Note (Signed)
Stable, no SE on prolia.

## 2016-02-10 NOTE — Assessment & Plan Note (Signed)
Well controlled. Continue current medication.  

## 2016-02-10 NOTE — Assessment & Plan Note (Signed)
Overtreated. Stop metformin. Follow CBGs . Recheck a1c in 3 months.

## 2016-02-10 NOTE — Progress Notes (Signed)
I have personally reviewed the Medicare Annual Wellness questionnaire and have noted 1. The patient's medical and social history 2. Their use of alcohol, tobacco or illicit drugs 3. Their current medications and supplements 4. The patient's functional ability including ADL's, fall risks, home safety risks and hearing or visual             impairment. 5. Diet and physical activities 6. Evidence for depression or mood disorders 7.         Updated provider list Cognitive evaluation was performed and recorded on pt medicare questionnaire form. The patients weight, height, BMI and visual acuity have been recorded in the chart  I have made referrals, counseling and provided education to the patient based review of the above and I have provided the pt with a written personalized care plan for preventive services.   She is feeling well overall.  Atypical SK versus AK versus dry skin patch on right lower leg.Marland Kitchen No improvement with OTC topical steroid cream .  Diabetes: On  low dose metformin daily, well controlled.  Lab Results  Component Value Date   HGBA1C 5.9 01/03/2016  Using medications without difficulties:  some GERD, but better if takes with food. Hypoglycemic episodes:  One 59, not sure why. Hyperglycemic episodes:None  Feet problems:None  Blood Sugars averaging: 95-124 FBS, 2 hours post prandial 105-.  eye exam within last year: In last year, had floater in last week, but resolved now.   GERD: improved off nexium.  Elevated Cholesterol: Almost at goal  NO LONGER on simvastatin 40 mg given age and SE risk outweighing benefit. Lab Results  Component Value Date   CHOL 180 01/03/2016   HDL 53.10 01/03/2016   LDLCALC 105 (H) 01/03/2016   TRIG 113.0 01/03/2016   CHOLHDL 3 01/03/2016   Using medications without problems: yes  Muscle aches: None  Other complaints:   waking with friends.  Gout: no further gout flares. Used colchicine as needed.  Uric acid in nml range  in 2015, not rechecked as no symptoms.  Hypertension: Well controlled on ramipril for her age. BP Readings from Last 3 Encounters:  02/10/16 133/70  02/02/16 (!) 142/77  01/11/16 (!) 150/64  Using medication without problems or lightheadedness:  Chest pain with exertion: none  Edema:None  Short of breath:None  Average home BPs: not checking BP  Other issues:    Social History /Family History/Past Medical History reviewed and updated if needed.  Review of Systems  Constitutional: Negative for fever and fatigue.  HENT: Negative for ear pain.  Eyes: Negative for pain.  Respiratory: Negative for cough, chest tightness and shortness of breath.  Cardiovascular: Negative for chest pain, palpitations and leg swelling.  Gastrointestinal: Negative for nausea, diarrhea, occ constipation, straining with BMs blood in stool and rectal pain.  Genitourinary: Negative for dysuria, vaginal bleeding, vaginal discharge, vaginal pain and pelvic pain.  No breast lesions.  Neurological: Negative for tremors, syncope, weakness and numbness.  Psychiatric/Behavioral: Negative for self-injury, dysphoric mood and agitation. The patient is not nervous/anxious.  Objective:   Physical Exam  Constitutional: Vital signs are normal. She appears well-developed and well-nourished. She is cooperative. Non-toxic appearance. She does not appear ill. No distress.  Elderly female in NAD  HENT:  Head: Normocephalic.  Right Ear: Hearing, tympanic membrane, external ear and ear canal normal.  Left Ear: Hearing, tympanic membrane, external ear and ear canal normal.  Nose: Nose normal.  Eyes: Conjunctivae, EOM and lids are normal. Pupils are equal, round, and  reactive to light. No foreign bodies found.  Neck: Trachea normal and normal range of motion. Neck supple. Carotid bruit is not present. No mass and no thyromegaly present.  Cardiovascular: Normal rate, regular rhythm, S1 normal, S2 normal,  normal heart sounds and intact distal pulses. Exam reveals no gallop.  No murmur heard.  Pulmonary/Chest: Effort normal and breath sounds normal. No respiratory distress. She has no wheezes. She has no rhonchi. She has no rales.  Abdominal: Soft. Normal appearance and bowel sounds are normal. She exhibits no distension, no fluid wave, no abdominal bruit and no mass. There is no hepatosplenomegaly. There is no tenderness. There is no rebound, no guarding and no CVA tenderness. No hernia.  Lymphadenopathy:  She has no cervical adenopathy.  She has no axillary adenopathy.  Neurological: She is alert. She has normal strength. No cranial nerve deficit or sensory deficit.  Skin: Skin is warm, dry and intact. No rash noted.  Psychiatric: Her speech is normal and behavior is normal. Judgment normal. Her mood appears not anxious. Cognition and memory are normal. She does not exhibit a depressed mood.   Diabetic foot exam:  Normal inspection  No skin breakdown  No calluses  Normal DP pulses  Normal sensation to light touch and monofilament  Nails normal  Assessment & Plan:   Annual Medicare Wellnes: The patient's preventative maintenance and recommended screening tests for an annual wellness exam were reviewed in full today.  Brought up to date unless services declined.  Counselled on the importance of diet, exercise, and its role in overall health and mortality.  The patient's FH and SH was reviewed, including their home life, tobacco status, and drug and alcohol status.   DEXA 2014 stable and slightly improved in spine Has been on fosamax for 4 years. Will stop fosamax and rec in 2 year.. 2016... DEXA showed worsening osteoporosis.  Now on prolia x 2 .. Will re-eval 2018. Colonoscopy in 2009,Dr. Edwards recommended no further needed. Up to Date with vaccines , including flu shot  Will stop mammograms. No pap or vaginal exam needed.   Nml memory on exam.

## 2016-02-14 ENCOUNTER — Encounter: Payer: Self-pay | Admitting: *Deleted

## 2016-02-14 NOTE — Telephone Encounter (Signed)
I have rec'd Ms. Homeyer's insurance verification for Prolia and she has an estimated responsibility of $0.  Please make pt aware this is an estimate and we will not know an exact amt until insurance(s) has/have paid.  I have sent a copy of the summary of benefits to be scanned into pt's chart.    Once pt recs injection, please let me know actual injection date so I can update the Prolia portal.  If you have any questions, please let me know.  Thank you!  Cc: Beatriz Stallion (for ordering purposes)

## 2016-02-14 NOTE — Telephone Encounter (Signed)
Lab scheduled for 03/07/2016 at 11:30 to check calcium level. Nurse visit scheduled for 03/14/2016 at 2:30 pm for Prolia injection.    [name], Ms. [name] would like you to call her to discuss insurance question she has about her Prolia.  She wants to know if regular Medicare or [name] is covering the Prolia.

## 2016-02-14 NOTE — Telephone Encounter (Signed)
This encounter was created in error - please disregard.

## 2016-02-15 NOTE — Telephone Encounter (Signed)
Called patient and explained that MCR would cover her Prolia first and [name] will consider the remaining after that.

## 2016-02-18 ENCOUNTER — Telehealth: Payer: Self-pay

## 2016-02-18 NOTE — Telephone Encounter (Signed)
Pt is trying to change insurance and pt needs to know what last t score was; advised t score of spine was -0.20 on 02/09/2015 and dx code for osteoporosis on pts problem list is M19.90. That was all pt needed.

## 2016-02-29 ENCOUNTER — Telehealth: Payer: Self-pay

## 2016-02-29 NOTE — Telephone Encounter (Signed)
Pt states that she was told to stay off of the metformin due to low BG.  She said on Sunday her BG was 200 so she took one tab of metformin, today after eating her BG was 180 so she took 1/2 a tab she forgot to check her sugar before eating. She would like to know if she needs to go back to taking metformin once day as before?

## 2016-02-29 NOTE — Telephone Encounter (Signed)
Okay to restart 1/2 tab daily. Continue to follow daily CBGs. Add back to med list.. Not XR.

## 2016-02-29 NOTE — Telephone Encounter (Signed)
[name] notified as instructed by telephone. Metformin 500 mg added back to medication list with instructions to take 250 mg daily with breakfast.

## 2016-03-03 ENCOUNTER — Other Ambulatory Visit: Payer: Self-pay | Admitting: Family Medicine

## 2016-03-03 DIAGNOSIS — M810 Age-related osteoporosis without current pathological fracture: Secondary | ICD-10-CM

## 2016-03-07 ENCOUNTER — Other Ambulatory Visit (INDEPENDENT_AMBULATORY_CARE_PROVIDER_SITE_OTHER): Payer: Medicare Other

## 2016-03-07 ENCOUNTER — Telehealth: Payer: Self-pay | Admitting: Family Medicine

## 2016-03-07 DIAGNOSIS — M810 Age-related osteoporosis without current pathological fracture: Secondary | ICD-10-CM

## 2016-03-07 LAB — BASIC METABOLIC PANEL
BUN: 23 mg/dL (ref 6–23)
CHLORIDE: 103 meq/L (ref 96–112)
CO2: 29 meq/L (ref 19–32)
CREATININE: 1.16 mg/dL (ref 0.40–1.20)
Calcium: 10.2 mg/dL (ref 8.4–10.5)
GFR: 46.53 mL/min — ABNORMAL LOW (ref >60.00)
GLUCOSE: 91 mg/dL (ref 70–99)
Potassium: 4.9 mEq/L (ref 3.5–5.1)
Sodium: 139 mEq/L (ref 135–145)

## 2016-03-07 NOTE — Telephone Encounter (Signed)
-----   Message from Ellamae Sia sent at 03/07/2016 11:28 AM EST ----- Regarding: Wants to add an order???? Wants calcium done for Prolia injection. Thanks T

## 2016-03-14 ENCOUNTER — Ambulatory Visit (INDEPENDENT_AMBULATORY_CARE_PROVIDER_SITE_OTHER): Payer: Medicare Other | Admitting: *Deleted

## 2016-03-14 DIAGNOSIS — M810 Age-related osteoporosis without current pathological fracture: Secondary | ICD-10-CM | POA: Diagnosis not present

## 2016-03-14 MED ORDER — DENOSUMAB 60 MG/ML ~~LOC~~ SOLN
60.0000 mg | SUBCUTANEOUS | Status: DC
  Administered 2016-03-14: 60 mg via SUBCUTANEOUS

## 2016-03-21 ENCOUNTER — Encounter: Payer: Self-pay | Admitting: Family Medicine

## 2016-03-21 ENCOUNTER — Ambulatory Visit (INDEPENDENT_AMBULATORY_CARE_PROVIDER_SITE_OTHER): Payer: Medicare Other | Admitting: Family Medicine

## 2016-03-21 DIAGNOSIS — K5909 Other constipation: Secondary | ICD-10-CM

## 2016-03-21 DIAGNOSIS — J069 Acute upper respiratory infection, unspecified: Secondary | ICD-10-CM | POA: Insufficient documentation

## 2016-03-21 DIAGNOSIS — B9789 Other viral agents as the cause of diseases classified elsewhere: Secondary | ICD-10-CM | POA: Diagnosis not present

## 2016-03-21 NOTE — Assessment & Plan Note (Signed)
Versus allergies.  Treat with flonase, rest fluids and xyzal at night.

## 2016-03-21 NOTE — Progress Notes (Signed)
Pre visit review using our clinic review tool, if applicable. No additional management support is needed unless otherwise documented below in the visit note. 

## 2016-03-21 NOTE — Progress Notes (Signed)
   Subjective:    Patient ID: [name], female    DOB: 1922-05-16, 82 y.o.   MRN: [id number]  HPI  82 year old female with history of  DM presents  with 6 days of  Sore throat, scratchy off and on, no sneeze, she has some nasal congestion. Mild facial pain, around eyes. Sinus  pressure.  No cough, no SOB.  No ear pain.  No fever. No chest pain. No body aches.   BMs  daily , but firm and straining some,  Mild pain in left abdomen for few day.Gaylyn Cheers now.  Nml po intake.   She feels tired, better now but not great. Using flonase 2 sprays per nostril daily. Not taking any OTC meds.          Review of Systems  Constitutional: Positive for fatigue. Negative for fever.  HENT: Positive for postnasal drip. Negative for ear pain.   Eyes: Negative for pain.  Respiratory: Negative for chest tightness and shortness of breath.   Cardiovascular: Negative for chest pain, palpitations and leg swelling.  Gastrointestinal: Negative for abdominal pain.  Genitourinary: Negative for dysuria.       Objective:   Physical Exam  Constitutional: Vital signs are normal. She appears well-developed and well-nourished. She is cooperative.  Non-toxic appearance. She does not appear ill. No distress.  HENT:  Head: Normocephalic.  Right Ear: Hearing, tympanic membrane, external ear and ear canal normal. Tympanic membrane is not erythematous, not retracted and not bulging. No middle ear effusion.  Left Ear: Hearing, tympanic membrane, external ear and ear canal normal. Tympanic membrane is not erythematous, not retracted and not bulging.  No middle ear effusion.  Nose: Mucosal edema present. No rhinorrhea. Right sinus exhibits no maxillary sinus tenderness and no frontal sinus tenderness. Left sinus exhibits no maxillary sinus tenderness and no frontal sinus tenderness.  Mouth/Throat: Uvula is midline, oropharynx is clear and moist and mucous membranes are normal.  Eyes: Conjunctivae, EOM and lids are  normal. Pupils are equal, round, and reactive to light. Lids are everted and swept, no foreign bodies found.  Neck: Trachea normal and normal range of motion. Neck supple. Carotid bruit is not present. No thyroid mass and no thyromegaly present.  Cardiovascular: Normal rate, regular rhythm, S1 normal, S2 normal, normal heart sounds, intact distal pulses and normal pulses.  Exam reveals no gallop and no friction rub.   No murmur heard. Pulmonary/Chest: Effort normal and breath sounds normal. No tachypnea. No respiratory distress. She has no decreased breath sounds. She has no wheezes. She has no rhonchi. She has no rales.  Abdominal: Soft. Normal appearance and bowel sounds are normal. There is no tenderness.  Neurological: She is alert.  Skin: Skin is warm, dry and intact. No rash noted.  Psychiatric: Her speech is normal and behavior is normal. Judgment and thought content normal. Her mood appears not anxious. Cognition and memory are normal. She does not exhibit a depressed mood.          Assessment & Plan:

## 2016-03-21 NOTE — Patient Instructions (Signed)
Start miralax daily 17 g daily for constipation until BM are softer and no straining.  Increase activity as you can.  Continue flonase.  Start xyzal at night.  Rest, fluids.  Call if shortness of breath, new fever, also call if not improving in 5-7 days.

## 2016-03-21 NOTE — Assessment & Plan Note (Signed)
Increase fluids, fiber, start miralax for few days.. Increase activity.  Likely cause of episode of abd pain.

## 2016-03-23 ENCOUNTER — Telehealth: Payer: Self-pay | Admitting: Family Medicine

## 2016-03-23 NOTE — Telephone Encounter (Signed)
Pt LVM on triage phone stating that she tried the Xyzal prescribed and it "knocked me out, I want to know is you can give me a different medication that won't make me so tired".  Best number to call pt is [phone]

## 2016-03-23 NOTE — Telephone Encounter (Signed)
Ave her start cetrizine at bedtime

## 2016-03-24 NOTE — Telephone Encounter (Signed)
Ms. Blandon notified as instructed by telephone.   °

## 2016-03-28 ENCOUNTER — Emergency Department (HOSPITAL_COMMUNITY)
Admission: EM | Admit: 2016-03-28 | Discharge: 2016-03-29 | Disposition: A | Discharge status: Home / Self Care | Payer: Medicare Other | Attending: Emergency Medicine | Admitting: Emergency Medicine

## 2016-03-28 ENCOUNTER — Encounter (HOSPITAL_COMMUNITY): Payer: Self-pay | Admitting: Emergency Medicine

## 2016-03-28 ENCOUNTER — Emergency Department (HOSPITAL_COMMUNITY): Payer: Medicare Other

## 2016-03-28 ENCOUNTER — Telehealth: Payer: Self-pay | Admitting: Family Medicine

## 2016-03-28 DIAGNOSIS — E119 Type 2 diabetes mellitus without complications: Secondary | ICD-10-CM | POA: Insufficient documentation

## 2016-03-28 DIAGNOSIS — M25511 Pain in right shoulder: Secondary | ICD-10-CM | POA: Diagnosis not present

## 2016-03-28 DIAGNOSIS — I10 Essential (primary) hypertension: Secondary | ICD-10-CM | POA: Diagnosis not present

## 2016-03-28 DIAGNOSIS — M25512 Pain in left shoulder: Secondary | ICD-10-CM | POA: Diagnosis not present

## 2016-03-28 DIAGNOSIS — Z7982 Long term (current) use of aspirin: Secondary | ICD-10-CM | POA: Diagnosis not present

## 2016-03-28 DIAGNOSIS — Z5321 Procedure and treatment not carried out due to patient leaving prior to being seen by health care provider: Secondary | ICD-10-CM | POA: Diagnosis not present

## 2016-03-28 DIAGNOSIS — Z87891 Personal history of nicotine dependence: Secondary | ICD-10-CM | POA: Diagnosis not present

## 2016-03-28 DIAGNOSIS — Z96651 Presence of right artificial knee joint: Secondary | ICD-10-CM | POA: Insufficient documentation

## 2016-03-28 DIAGNOSIS — Z7984 Long term (current) use of oral hypoglycemic drugs: Secondary | ICD-10-CM | POA: Insufficient documentation

## 2016-03-28 NOTE — ED Triage Notes (Signed)
Pt states that she started having L shoulder pain this morning. Denies injury. Also states she has had a URI. Alert and oriented.

## 2016-03-28 NOTE — Telephone Encounter (Signed)
[facility]  Patient Name: [name]  DOB: 07-14-1922    Initial Comment Caller states that she has a cold. Thought she was getting better. Was told if it got worse to call. She did take Xyzal, which knocked her out. Then she was told to try Equate for allergy relief. Has been sleeping off and on and has a low grade fever of 99.9 with chills. Shoulder pain as well.    Nurse Assessment  Nurse: Venetia Maxon, RN, Manuela Schwartz Date/Time (Eastern Time): 03/28/2016 5:52:31 PM  Confirm and document reason for call. If symptomatic, describe symptoms. ---Caller states that she has a cold. Thought she was getting better. Was told if it got worse to call. She did take Xyzal, which knocked her out. Then she was told to try Equate for allergy relief. Has been sleeping off and on and has a low grade fever of 99.9 with chills. Shoulder pain as well. Saw Dr last week who advised her to take the above med. she has been cough at times. Her right shoulder hurts in front ( has had this before). temp was oral. 100.9 oral now she began to have cold symptoms last Thurs. She is not short of breath. She has a dry cough. She began to shiver this afternoon. nose is stuffy. She has urinated.  Does the patient have any new or worsening symptoms? ---Yes  Will a triage be completed? ---Yes  Related visit to physician within the last 2 weeks? ---Yes  Does the PT have any chronic conditions? (i.e. diabetes, asthma, etc.) ---Yes  List chronic conditions. ---HTN, Metformin. CBS 121 this am . no blood thinners baby ASA at night MVI , bone supplement Ibuprofen.  Is this a behavioral health or substance abuse call? ---No     Guidelines    Guideline Title Affirmed Question Affirmed Notes  Cough - Acute Non-Productive [1] Sinus pain (around cheekbone or eye) AND [2] present > 24 hours using nasal washes and pain meds    Final Disposition User   See Physician within 24 Hours Whitaker, RN, Manuela Schwartz     Referrals  REFERRED TO PCP OFFICE  REFERRED TO PCP OFFICE   Disagree/Comply: Comply

## 2016-03-29 ENCOUNTER — Encounter: Payer: Self-pay | Admitting: Family Medicine

## 2016-03-29 ENCOUNTER — Ambulatory Visit (INDEPENDENT_AMBULATORY_CARE_PROVIDER_SITE_OTHER): Payer: Medicare Other | Admitting: Family Medicine

## 2016-03-29 DIAGNOSIS — R52 Pain, unspecified: Secondary | ICD-10-CM | POA: Diagnosis not present

## 2016-03-29 DIAGNOSIS — M25511 Pain in right shoulder: Secondary | ICD-10-CM

## 2016-03-29 DIAGNOSIS — J019 Acute sinusitis, unspecified: Secondary | ICD-10-CM

## 2016-03-29 MED ORDER — AMOXICILLIN 500 MG PO CAPS: 1000.0000 mg | ORAL_CAPSULE | Freq: Two times a day (BID) | ORAL | 0 refills | Status: DC

## 2016-03-29 NOTE — Progress Notes (Signed)
Pre visit review using our clinic review tool, if applicable. No additional management support is needed unless otherwise documented below in the visit note. 

## 2016-03-29 NOTE — Patient Instructions (Addendum)
Stop at front desk for referral to ortho for referral to [facility].  Use tylenol for pain.  Complete antibiotic course x 10 days.  Use probiotic while on antibiotics.  Call if fever not resolving.

## 2016-03-29 NOTE — Progress Notes (Signed)
   Subjective:    Patient ID: [name], female    DOB: 1922/07/09, 82 y.o.   MRN: [id number]  HPI   82 year old female presents with   1. Right anterior shoulder pain severe, worse than ever before. No fall, no new injury She is in a sling to help with pain.   She has had issues for last year, decreased ROM.  X-ray showed severe right shoulder arthritis  2.   URI symptoms still ongoing from last OV 03/21/2016 Cough, congestion, no ear pain,  Mild sinus pain, bloody mucus from right nostril at times  No ST.  Fever started yesterday 100.4 F Review of Systems     Objective:   Physical Exam  Constitutional: Vital signs are normal. She appears well-developed and well-nourished. She is cooperative.  Non-toxic appearance. She does not appear ill. No distress.  HENT:  Head: Normocephalic.  Right Ear: Hearing, tympanic membrane, external ear and ear canal normal. Tympanic membrane is not erythematous, not retracted and not bulging.  Left Ear: Hearing, tympanic membrane, external ear and ear canal normal. Tympanic membrane is not erythematous, not retracted and not bulging.  Nose: Mucosal edema and rhinorrhea present. Right sinus exhibits no maxillary sinus tenderness and no frontal sinus tenderness. Left sinus exhibits no maxillary sinus tenderness and no frontal sinus tenderness.  Mouth/Throat: Uvula is midline, oropharynx is clear and moist and mucous membranes are normal.  Eyes: Conjunctivae, EOM and lids are normal. Pupils are equal, round, and reactive to light. Lids are everted and swept, no foreign bodies found.  Neck: Trachea normal and normal range of motion. Neck supple. Carotid bruit is not present. No thyroid mass and no thyromegaly present.  Cardiovascular: Normal rate, regular rhythm, S1 normal, S2 normal, normal heart sounds, intact distal pulses and normal pulses.  Exam reveals no gallop and no friction rub.   No murmur heard. Pulmonary/Chest: Effort normal and breath  sounds normal. No tachypnea. No respiratory distress. She has no decreased breath sounds. She has no wheezes. She has no rhonchi. She has no rales.  Musculoskeletal:       Right shoulder: She exhibits decreased range of motion, tenderness and bony tenderness. She exhibits no swelling and no effusion.  Neurological: She is alert.  Skin: Skin is warm, dry and intact. No rash noted.  Psychiatric: Her speech is normal and behavior is normal. Judgment normal. Her mood appears not anxious. Cognition and memory are normal. She does not exhibit a depressed mood.          Assessment & Plan:

## 2016-03-29 NOTE — Telephone Encounter (Signed)
Pt has appt on 03/29/16 at 11AM to see Dr Diona Browner.

## 2016-03-29 NOTE — Assessment & Plan Note (Addendum)
Due to severe OA. Recommend referral to [facility] for steroid injections.

## 2016-03-29 NOTE — Assessment & Plan Note (Signed)
Given new fever after several weeks of vral URI.Marland Kitchen concern for bacterial infection. Treat with amox x 10 days.

## 2016-04-03 ENCOUNTER — Telehealth: Payer: Self-pay

## 2016-04-03 DIAGNOSIS — M19011 Primary osteoarthritis, right shoulder: Secondary | ICD-10-CM | POA: Diagnosis not present

## 2016-04-03 NOTE — Telephone Encounter (Signed)
Pt left v/m; pt seen 03/29/16; pt taking abx as instructed but has developed diarrhea. Pt wants to know if should continue abx or get different abx. Pt request cb.[place]

## 2016-04-03 NOTE — Telephone Encounter (Signed)
Pt called to ck status. She has appt today and ok to vm.

## 2016-04-04 NOTE — Telephone Encounter (Signed)
Ms. [name] notified as instructed by telephone.  She is taking Align along with the antibiotic. She states she actually does not have diarrhea and she is sorry that she bothered [place].

## 2016-04-04 NOTE — Telephone Encounter (Signed)
This is common SE of  Most antibiotics.. This is why I suggested probiotic with it. She does not need appt for this ( unless continued fever).. Just keep pushing fluids and given antibitoics time.Marland Kitchen

## 2016-04-06 ENCOUNTER — Ambulatory Visit (INDEPENDENT_AMBULATORY_CARE_PROVIDER_SITE_OTHER): Payer: Medicare Other | Admitting: Family Medicine

## 2016-04-06 ENCOUNTER — Encounter: Payer: Self-pay | Admitting: Family Medicine

## 2016-04-06 DIAGNOSIS — J019 Acute sinusitis, unspecified: Secondary | ICD-10-CM | POA: Diagnosis not present

## 2016-04-06 DIAGNOSIS — M19011 Primary osteoarthritis, right shoulder: Secondary | ICD-10-CM | POA: Diagnosis not present

## 2016-04-06 DIAGNOSIS — H6122 Impacted cerumen, left ear: Secondary | ICD-10-CM | POA: Diagnosis not present

## 2016-04-06 NOTE — Progress Notes (Signed)
   Subjective:    Patient ID: [name], female    DOB: 1923/02/12, 82 y.o.   MRN: [id number]  HPI  82 year old female presents for follow up.  She was dx on 12/6 with sinusitis and started on  Amoxicillin.  (symptoms had started on 03/21/2016, had new fever )  Now she has noted  In afternoons temp 99.9F -100.6F   She feels better overall... Improved fatigue. No cough, less congestion.  She feels like she has some decreased hearing and some fluid in ears..  She has seen Dr. Jacqulyn Bath for [facility]...was feeling better at that time.. So did not have steroid injection. Now pain has restarted pain in last 24 hours. At night pain was 5/10 on pain scale. Tylenol helped pain.   Review of Systems  Constitutional: Negative for fatigue and fever.  HENT: Negative for ear pain.   Eyes: Negative for pain.  Respiratory: Negative for chest tightness and shortness of breath.   Cardiovascular: Negative for chest pain, palpitations and leg swelling.  Gastrointestinal: Negative for abdominal pain.  Genitourinary: Negative for dysuria.       Objective:   Physical Exam  Constitutional: Vital signs are normal. She appears well-developed and well-nourished. She is cooperative.  Non-toxic appearance. She does not appear ill. No distress.  HENT:  Head: Normocephalic.  Right Ear: Hearing, tympanic membrane, external ear and ear canal normal. Tympanic membrane is not erythematous, not retracted and not bulging.  Left Ear: Hearing, tympanic membrane, external ear and ear canal normal. Tympanic membrane is not erythematous, not retracted and not bulging.  Nose: No mucosal edema or rhinorrhea. Right sinus exhibits no maxillary sinus tenderness and no frontal sinus tenderness. Left sinus exhibits no maxillary sinus tenderness and no frontal sinus tenderness.  Mouth/Throat: Uvula is midline, oropharynx is clear and moist and mucous membranes are normal.  Cerumen impaction on left and mild on right... irrigated   Eyes: Conjunctivae, EOM and lids are normal. Pupils are equal, round, and reactive to light. Lids are everted and swept, no foreign bodies found.  Neck: Trachea normal and normal range of motion. Neck supple. Carotid bruit is not present. No thyroid mass and no thyromegaly present.  Cardiovascular: Normal rate, regular rhythm, S1 normal, S2 normal, normal heart sounds, intact distal pulses and normal pulses.  Exam reveals no gallop and no friction rub.   No murmur heard. Pulmonary/Chest: Effort normal and breath sounds normal. No tachypnea. No respiratory distress. She has no decreased breath sounds. She has no wheezes. She has no rhonchi. She has no rales.  Neurological: She is alert.  Skin: Skin is warm, dry and intact. No rash noted.  Psychiatric: Her speech is normal and behavior is normal. Judgment normal. Her mood appears not anxious. Cognition and memory are normal. She does not exhibit a depressed mood.          Assessment & Plan:

## 2016-04-06 NOTE — Progress Notes (Signed)
Pre visit review using our clinic review tool, if applicable. No additional management support is needed unless otherwise documented below in the visit note. 

## 2016-04-06 NOTE — Assessment & Plan Note (Signed)
Tylenol prn 

## 2016-04-06 NOTE — Assessment & Plan Note (Signed)
Irrigated.. Cause of decreased hearing and fullness symptom.

## 2016-04-06 NOTE — Assessment & Plan Note (Signed)
Improving with antibiotics. Complete.

## 2016-04-06 NOTE — Patient Instructions (Signed)
Can use tylenol every 6-8 hours  for pain in shoulder.  Complete antibiotics.  Call if not continuing to improve.

## 2016-04-13 ENCOUNTER — Telehealth: Payer: Self-pay | Admitting: Family Medicine

## 2016-04-13 NOTE — Telephone Encounter (Signed)
PLEASE NOTE: All timestamps contained within this report are represented as [place] Standard Time. CONFIDENTIALTY NOTICE: This fax transmission is intended only for the addressee. It contains information that is legally privileged, confidential or otherwise protected from use or disclosure. If you are not the intended recipient, you are strictly prohibited from reviewing, disclosing, copying using or disseminating any of this information or taking any action in reliance on or regarding this information. If you have received this fax in error, please notify us immediately by telephone so that we can arrange for its return to [place]. Phone: [phone], Toll-Free: [phone], Fax: [phone] Page: 1 of 1 Call Id: [id number] [place] Patient Name: [name] DOB: 20-Jul-1922 Initial Comment BS 166 after eating, this morning BS 121, been running high last few days, also has shoulder pain, been taking tylenol Nurse Assessment Nurse: Marcelline Deist, RN, Kermit Balo Date/Time (Eastern Time): 04/13/2016 11:27:58 AM Confirm and document reason for call. If symptomatic, describe symptoms. ---BS 166 after eating, this morning BS 121 fasting, been running high last few days, was up in the 200's last night. She also has shoulder pain, was seen a week 1/2 ago at [facility], took an x ray, has been taking Tylenol twice a day which helps. Was seen by an orthopedic, thinks it is arthritis. Recently was sick, was on antibiotics. Is on 1/2 of Metformin. Does the patient have any new or worsening symptoms? ---Yes Will a triage be completed? ---Yes Related visit to physician within the last 2 weeks? ---No Does the PT have any chronic conditions? (i.e. diabetes, asthma, etc.) ---Yes List chronic conditions. ---diabetes, arthritis, on Nexium, high BP rx Is this a behavioral health or substance abuse call?  ---No Guidelines Guideline Title Affirmed Question Affirmed Notes Diabetes - High Blood Sugar Blood glucose 60-240 mg/dl (3.5 -13 mmol/ l) (all triage questions negative) Final Disposition User [place], RN, Lynda Comments Caller states she has only been taking 1/2 of Metformin HCL 500 mg for last couple months as Dr. she saw told her to go down to half. She has been experiencing higher sugars since 12/4. Please contact caller to let her know whether she needs to increase dose of Metformin since Dr. Damita Dunnings mentioned to her that she can only take 1/2 of pill. Has noticed rise in her BS levels. Disagree/Comply: Comply

## 2016-04-14 NOTE — Telephone Encounter (Signed)
Let pt know if CBGs anytime < 60.. Take OJ 4 oz and repeat check in 15 min, if still low  Repeat 4 oz OJ and call office. If CBGs fasting running >80.Marland Kitchen Continue full tab metformin.  CBGs 60-80 call office for advice.

## 2016-04-14 NOTE — Telephone Encounter (Signed)
I let patient know to take 1 tab metformin  And watch her sugar.  Patient said she takes it in the morning when she eats breakfast.  Patient wants to know if her sugar gets too low, should she take 1/2 a tablet? Please call patient at [phone].

## 2016-04-14 NOTE — Telephone Encounter (Signed)
Reviewed kindney function GFR > 45 last check.Faythe Ghee to have pt increase to full tab metformin. Follow CBGs closely.

## 2016-04-14 NOTE — Telephone Encounter (Signed)
Spoke to pt and advised per Dr Diona Browner. Pt wrote down instruction and confirmed

## 2016-04-22 ENCOUNTER — Other Ambulatory Visit: Payer: Self-pay | Admitting: Family Medicine

## 2016-05-02 ENCOUNTER — Ambulatory Visit (INDEPENDENT_AMBULATORY_CARE_PROVIDER_SITE_OTHER): Payer: Medicare Other | Admitting: Podiatry

## 2016-05-02 ENCOUNTER — Encounter: Payer: Self-pay | Admitting: Podiatry

## 2016-05-02 VITALS — BP 162/81 | HR 71 | Resp 14

## 2016-05-02 DIAGNOSIS — B351 Tinea unguium: Secondary | ICD-10-CM

## 2016-05-02 DIAGNOSIS — M79676 Pain in unspecified toe(s): Secondary | ICD-10-CM | POA: Diagnosis not present

## 2016-05-02 NOTE — Patient Instructions (Signed)

## 2016-05-02 NOTE — Progress Notes (Signed)
Patient ID: [name], female   DOB: 02/16/1923, 83 y.o.   MRN: [id number]    Subjective: This patient presents today complaining of uncomfortable toenails walking wearing shoes and requests toenail debridement. Patient is known diabetic and does denies any history of amputation, claudication or amputation  Objective: Orientated 3 DP and PT pulses 2/4 bilaterally Capillary reflex immediate bilaterally Sensation to 10 g monofilament wire intact 4/5 right and 5/5 left Vibratory sensation reactive bilaterally Ankle reflex equal and reactive bilaterally No open skin lesions bilaterally Atrophic skin bilaterally The toenails are elongated, discolored, hypertrophic and tender to direct palpation 6-10 HAV bilaterally Hammertoe second bilaterally  Assessment: Diabetic with satisfactory neurovascular status Symptomatic onychomycoses 6-10  Plan: Debridement toenails 6-10 mechanically and electrically. Slight bleeding distal right hallux T with antibiotic ointment and Band-Aid. Patient instructed removed Band-Aid 1-3 days and continue applying topical antibiotic ointment and Band-Aid until a scab forms.  Reappoint 3 months

## 2016-05-05 ENCOUNTER — Telehealth: Payer: Self-pay | Admitting: Family Medicine

## 2016-05-05 DIAGNOSIS — E119 Type 2 diabetes mellitus without complications: Secondary | ICD-10-CM

## 2016-05-05 NOTE — Telephone Encounter (Signed)
-----   Message from Marchia Bond sent at 04/27/2016  2:47 PM EST ----- Regarding: dm f/u labs Tues 1/16, need orders. Thanks! :-) Please order future dm f/u labs for pt's upcoming lab appt. Thanks Aniceto Boss

## 2016-05-09 ENCOUNTER — Telehealth: Payer: Self-pay | Admitting: Family Medicine

## 2016-05-09 ENCOUNTER — Other Ambulatory Visit: Payer: Medicare Other | Admitting: Family Medicine

## 2016-05-09 ENCOUNTER — Other Ambulatory Visit: Payer: Medicare Other

## 2016-05-09 NOTE — Telephone Encounter (Signed)
[place] Call Center Patient Name: [name] DOB: 04/19/1923 Initial Comment Caller states she woke up, and felt dizzy, and she has diarrhea. Nurse Assessment Nurse: Markus Daft, RN, Sherre Poot Date/Time (Eastern Time): 05/09/2016 8:26:40 AM Confirm and document reason for call. If symptomatic, describe symptoms. ---Caller states she woke up, went to bathroom, and felt dizzy, and she has had diarrhea twice. Nauseated. - No dizziness now while laying down. Does the patient have any new or worsening symptoms? ---Yes Will a triage be completed? ---Yes Related visit to physician within the last 2 weeks? ---No Does the PT have any chronic conditions? (i.e. diabetes, asthma, etc.) ---Yes List chronic conditions. ---DM, HTN Is this a behavioral health or substance abuse call? ---No Guidelines Guideline Title Affirmed Question Affirmed Notes Diarrhea MILD-MODERATE diarrhea (e.g., 1-6 times / day more than normal) (all triage questions negative) Final Disposition User [place], RN, Sherre Poot Comments 3 wks ago finished round of antibiotics. NO diarrhea til today. Disagree/Comply: Comply

## 2016-05-09 NOTE — Telephone Encounter (Signed)
Agree with home care. 

## 2016-05-11 ENCOUNTER — Other Ambulatory Visit: Payer: Self-pay | Admitting: Family Medicine

## 2016-05-12 ENCOUNTER — Telehealth: Payer: Self-pay

## 2016-05-12 ENCOUNTER — Ambulatory Visit (INDEPENDENT_AMBULATORY_CARE_PROVIDER_SITE_OTHER): Payer: Medicare Other | Admitting: Family Medicine

## 2016-05-12 ENCOUNTER — Encounter: Payer: Self-pay | Admitting: Family Medicine

## 2016-05-12 VITALS — BP 127/83 | HR 82 | Temp 97.5°F | Ht 63.0 in | Wt 124.0 lb

## 2016-05-12 DIAGNOSIS — R197 Diarrhea, unspecified: Secondary | ICD-10-CM

## 2016-05-12 DIAGNOSIS — I10 Essential (primary) hypertension: Secondary | ICD-10-CM

## 2016-05-12 DIAGNOSIS — E119 Type 2 diabetes mellitus without complications: Secondary | ICD-10-CM | POA: Diagnosis not present

## 2016-05-12 LAB — BASIC METABOLIC PANEL WITH GFR
BUN: 16 mg/dL (ref 7–25)
CO2: 24 mmol/L (ref 20–31)
Calcium: 8.7 mg/dL (ref 8.6–10.4)
Chloride: 110 mmol/L (ref 98–110)
Creat: 1.01 mg/dL — ABNORMAL HIGH (ref 0.60–0.88)
Glucose, Bld: 119 mg/dL — ABNORMAL HIGH (ref 65–99)
Potassium: 4.5 mmol/L (ref 3.5–5.3)
Sodium: 141 mmol/L (ref 135–146)

## 2016-05-12 LAB — HEMOGLOBIN A1C
Hgb A1c MFr Bld: 5.5 %
Mean Plasma Glucose: 111 mg/dL

## 2016-05-12 LAB — HM DIABETES FOOT EXAM

## 2016-05-12 NOTE — Addendum Note (Signed)
Addended by: Marchia Bond on: 05/12/2016 04:38 PM   Modules accepted: Orders

## 2016-05-12 NOTE — Progress Notes (Signed)
   Subjective:    Patient ID: [name], female    DOB: 1923/04/21, 83 y.o.   MRN: [id number]  HPI    83 year old female presents for 3 month follow up DM, HTN  She has also has some recent diarrhea.  3 days ago awoke with loose stools, nausea. No emesis. No fever, no abdominal pain. Stomach churning after eating, no further diarrhea until last night.  She had one more episoe but had eaten a large salad. Eating soup, saltines and poached eggs. She did have antibiotics in December. She did take probiotic   Diabetes:     Due for labs to eval..low dose metformin. Lab Results  Component Value Date   HGBA1C 5.9 01/03/2016  Using medications without difficulties: Hypoglycemic episodes: Hyperglycemic episodes: Feet problems:none Blood Sugars averaging:not chekcing eye exam within last year:yes  Hypertension:    Good control on low dose ramipril. BP Readings from Last 3 Encounters:  05/12/16 127/83  05/02/16 (!) 162/81  04/06/16 136/80  Using medication without problems or lightheadedness:  none Chest pain with exertion: none Edema:none Short of breath:none Average home BPs: good Other issues:    Review of Systems  Constitutional: Negative for fatigue and fever.  HENT: Negative for ear pain.   Eyes: Negative for pain.  Respiratory: Negative for chest tightness and shortness of breath.   Cardiovascular: Negative for chest pain, palpitations and leg swelling.  Gastrointestinal: Negative for abdominal pain.  Genitourinary: Negative for dysuria.       Objective:   Physical Exam  Constitutional: Vital signs are normal. She appears well-developed and well-nourished. She is cooperative.  Non-toxic appearance. She does not appear ill. No distress.  Elderly female in NAD  HENT:  Head: Normocephalic.  Right Ear: Hearing, tympanic membrane, external ear and ear canal normal. Tympanic membrane is not erythematous, not retracted and not bulging.  Left Ear: Hearing, tympanic  membrane, external ear and ear canal normal. Tympanic membrane is not erythematous, not retracted and not bulging.  Nose: No mucosal edema or rhinorrhea. Right sinus exhibits no maxillary sinus tenderness and no frontal sinus tenderness. Left sinus exhibits no maxillary sinus tenderness and no frontal sinus tenderness.  Mouth/Throat: Uvula is midline, oropharynx is clear and moist and mucous membranes are normal.  Eyes: Conjunctivae, EOM and lids are normal. Pupils are equal, round, and reactive to light. Lids are everted and swept, no foreign bodies found.  Neck: Trachea normal and normal range of motion. Neck supple. Carotid bruit is not present. No thyroid mass and no thyromegaly present.  Cardiovascular: Normal rate, regular rhythm, S1 normal, S2 normal, normal heart sounds, intact distal pulses and normal pulses.  Exam reveals no gallop and no friction rub.   No murmur heard. Pulmonary/Chest: Effort normal and breath sounds normal. No tachypnea. No respiratory distress. She has no decreased breath sounds. She has no wheezes. She has no rhonchi. She has no rales.  Abdominal: Soft. Normal appearance and bowel sounds are normal. There is no tenderness.  Neurological: She is alert.  Skin: Skin is warm, dry and intact. No rash noted.  Psychiatric: Her speech is normal and behavior is normal. Judgment and thought content normal. Her mood appears not anxious. Cognition and memory are normal. She does not exhibit a depressed mood.          Assessment & Plan:

## 2016-05-12 NOTE — Assessment & Plan Note (Signed)
Resolving. Push fluids, bland diet discussed in detail. Most likely viral GE. If not improving consider c.difficle eval given antibiotics in last 3 months.  Can stay on probiotic long term given possible IBS.

## 2016-05-12 NOTE — Telephone Encounter (Signed)
Pt has appt 05/12/16 at 2:30 with Dr Diona Browner.

## 2016-05-12 NOTE — Telephone Encounter (Signed)
PLEASE NOTE: All timestamps contained within this report are represented as [place] Standard Time. CONFIDENTIALTY NOTICE: This fax transmission is intended only for the addressee. It contains information that is legally privileged, confidential or otherwise protected from use or disclosure. If you are not the intended recipient, you are strictly prohibited from reviewing, disclosing, copying using or disseminating any of this information or taking any action in reliance on or regarding this information. If you have received this fax in error, please notify us immediately by telephone so that we can arrange for its return to [place]. Phone: [phone], Toll-Free: [phone], Fax: [phone] Page: 1 of 2 Call Id: [id number] [place] Patient Name: [name] Gender: Female DOB: 14-Nov-1922 Age: 83 Y 76 M 2 D Return Phone Number: [phone] (Primary) Address: City/State/Zip: [place] Night - Client Client Site [place] Physician Eliezer Lofts - MD Contact Type Call Who Is Calling Patient / Member / Family / Caregiver Call Type Triage / Clinical Relationship To Patient Self Return Phone Number [phone] (Primary) Chief Complaint Diarrhea Reason for Call Symptomatic / Request for [place] says she had some diarrhea on Monday and a nurse told her to eat carbohydrates, she didn't have anymore diarrhea up until today but she ate some salad for lunch today and she start it diarrhea again. PreDisposition Home Care Translation No Nurse Assessment Nurse: Toribio Harbour, RN, Joelene Millin Date/Time (Eastern Time): 05/11/2016 10:40:53 PM Confirm and document reason for call. If symptomatic, describe symptoms. ---caller states has had 2 episodes of diarrhea tonight after eating salad for lunch Does the patient  have any new or worsening symptoms? ---Yes Will a triage be completed? ---Yes Related visit to physician within the last 2 weeks? ---No Does the PT have any chronic conditions? (i.e. diabetes, asthma, etc.) ---Yes List chronic conditions. ---Diabetes, HTN, heart disease, arthritis Is this a behavioral health or substance abuse call? ---No Guidelines Guideline Title Affirmed Question Affirmed Notes Nurse Date/Time (Eastern Time) Diarrhea [1] Recent antibiotic therapy (i.e., within last 2 months) AND [2] > 3 days since antibiotic was stopped Toribio Harbour, RN, Joelene Millin 05/11/2016 10:43:59 PM Disp. Time [name] Time) Disposition Final User 05/11/2016 10:51:50 PM See Physician within 24 Hours Yes [place], RN, Joelene Millin PLEASE NOTE: All timestamps contained within this report are represented as [place] Standard Time. CONFIDENTIALTY NOTICE: This fax transmission is intended only for the addressee. It contains information that is legally privileged, confidential or otherwise protected from use or disclosure. If you are not the intended recipient, you are strictly prohibited from reviewing, disclosing, copying using or disseminating any of this information or taking any action in reliance on or regarding this information. If you have received this fax in error, please notify us immediately by telephone so that we can arrange for its return to [place]. Phone: [phone], Toll-Free: [phone], Fax: [phone] Page: 2 of 2 Call Id: [id number] Caller Understands: Yes Disagree/Comply: Comply Care Advice Given Per Guideline SEE PHYSICIAN WITHIN 24 HOURS: * IF OFFICE WILL BE OPEN: You need to be seen within the next 24 hours. Call your doctor when the office opens, and make an appointment. EDUCATION: * Most diarrhea that occurs in association with taking antibiotics will resolve on its own. However, sometimes a patient can develop a type of bacterial diarrhea after taking antibiotics. * Drink more fluids, at  least 8-10 cups daily. One cup equals 8  oz (240 ml). * WATER: For mild to moderate diarrhea, water is often the best liquid to drink. You should also eat some salty foods (e.g., potato chips, pretzels, saltine crackers). This is important to make sure you are getting enough salt, sugars, and fluids to meet your body's needs. * Avoid caffeinated beverages (Reason: caffeine is mildly dehydrating). * Maintaining some food intake during episodes of diarrhea is important. * Begin with boiled starches / cereals (e.g., potatoes, rice, noodles, wheat, oats) with a small amount of salt to taste. * Other foods that are OK include: bananas, yogurt, crackers, soup. CALL BACK IF: * Signs of dehydration occur (e.g., no urine over 12 hours, very dry mouth, lightheaded, etc.) * Bloody stools * Constant or severe abdominal pain * You become worse. CARE ADVICE given per Diarrhea (Adult) guideline. Referrals REFERRED TO PCP OFFICE

## 2016-05-12 NOTE — Assessment & Plan Note (Signed)
Due for re-eval. 

## 2016-05-12 NOTE — Patient Instructions (Addendum)
Can start align probiotic. Push fluids.  Call fever > 100.4 F, and if diarrhea is persistent.  Stop at lab on way out

## 2016-05-12 NOTE — Telephone Encounter (Signed)
Is she coming in or not?

## 2016-05-12 NOTE — Assessment & Plan Note (Signed)
Well controlled. Continue current medication.  

## 2016-05-12 NOTE — Telephone Encounter (Signed)
Yes, [name] is bringing her.

## 2016-05-12 NOTE — Progress Notes (Signed)
Pre visit review using our clinic review tool, if applicable. No additional management support is needed unless otherwise documented below in the visit note. 

## 2016-05-15 ENCOUNTER — Telehealth: Payer: Self-pay | Admitting: Family Medicine

## 2016-05-15 ENCOUNTER — Telehealth: Payer: Self-pay

## 2016-05-15 NOTE — Telephone Encounter (Signed)
Mardene Celeste nurse case mgr with [facility] [employer] left v/m requesting cb; Mardene Celeste unable to reach pt . Left v/m requesting cb from patricia.

## 2016-05-15 NOTE — Telephone Encounter (Signed)
Pt returned call for you cb number is [phone] Thanks

## 2016-05-15 NOTE — Telephone Encounter (Signed)
Lab results discussed with patient.  See result note. 

## 2016-05-26 NOTE — Telephone Encounter (Signed)
Left v/m for Mardene Celeste to cb.

## 2016-05-29 ENCOUNTER — Telehealth: Payer: Self-pay | Admitting: Family Medicine

## 2016-05-29 NOTE — Telephone Encounter (Signed)
Patient called to schedule a 3 month follow up with Dr.Bedsole.  Patient wants to know if Dr.Bedsole wants her to have lab work done before her appointment.

## 2016-05-30 NOTE — Telephone Encounter (Signed)
No labs

## 2016-05-30 NOTE — Telephone Encounter (Signed)
Left message with Mrs. Dempsey that no labs are needed prior to her next appointment.

## 2016-06-02 ENCOUNTER — Telehealth: Payer: Self-pay

## 2016-06-02 NOTE — Telephone Encounter (Signed)
[name] notified as instructed by telephone.   °

## 2016-06-02 NOTE — Telephone Encounter (Signed)
Try TWO sprays of flonase ONCE daily.  Otherwise she could also try coricidin HBP.

## 2016-06-02 NOTE — Telephone Encounter (Signed)
Pt left v/m ; pt is using Flonase 1 squirt each nostril bid. Shortly after uses Flonase rt nostril will close back off. Pt wants to know what to do about keeping rt nostril open. Pt request cb. [place]

## 2016-06-08 ENCOUNTER — Other Ambulatory Visit: Payer: Self-pay | Admitting: Family Medicine

## 2016-06-13 ENCOUNTER — Telehealth: Payer: Self-pay | Admitting: Family Medicine

## 2016-06-13 NOTE — Telephone Encounter (Signed)
[place] Call Center Patient Name: [name] DOB: 1922-10-18 Initial Comment Caller states for the last few days she has felt bad and yesterday she did rest, but her stomach started hurting. This morning her stomach is still hurting and she feels like she needs to burp. Nurse Assessment Nurse: Markus Daft, RN, Sherre Poot Date/Time (Eastern Time): 06/13/2016 10:12:49 AM Confirm and document reason for call. If symptomatic, describe symptoms. ---Caller states c/o upper abdominal pain off/on since yesterday afternoon, and this AM, and she feels like she needs to burp. No vomiting/diarrhea/fever. Does the patient have any new or worsening symptoms? ---Yes Will a triage be completed? ---Yes Related visit to physician within the last 2 weeks? ---No Does the PT have any chronic conditions? (i.e. diabetes, asthma, etc.) ---Yes List chronic conditions. ---DM, GERD - on Nexium Is this a behavioral health or substance abuse call? ---No Guidelines Guideline Title Affirmed Question Affirmed Notes Abdominal Pain - Upper [1] Abdominal pain is intermittent AND [2] shoots into chest, with sour taste in mouth Final Disposition User See PCP When Office is Open (within 3 days) Markus Daft, RN, [place] Comments Appt on Thursday with Dr. Diona Browner 9:30 am Disagree/Comply: [name]

## 2016-06-13 NOTE — Telephone Encounter (Signed)
Pt has appt to see Dr Diona Browner on 06/15/16 at 9:30.AM.

## 2016-06-15 ENCOUNTER — Ambulatory Visit (INDEPENDENT_AMBULATORY_CARE_PROVIDER_SITE_OTHER): Payer: Medicare Other | Admitting: Family Medicine

## 2016-06-15 ENCOUNTER — Encounter: Payer: Self-pay | Admitting: Family Medicine

## 2016-06-15 VITALS — BP 90/58 | HR 82 | Temp 98.0°F | Ht 63.0 in | Wt 121.8 lb

## 2016-06-15 DIAGNOSIS — J3089 Other allergic rhinitis: Secondary | ICD-10-CM

## 2016-06-15 DIAGNOSIS — K219 Gastro-esophageal reflux disease without esophagitis: Secondary | ICD-10-CM

## 2016-06-15 DIAGNOSIS — R5383 Other fatigue: Secondary | ICD-10-CM

## 2016-06-15 DIAGNOSIS — R1013 Epigastric pain: Secondary | ICD-10-CM | POA: Insufficient documentation

## 2016-06-15 LAB — VITAMIN D 25 HYDROXY (VIT D DEFICIENCY, FRACTURES): VITD: 30.39 ng/mL (ref 30.00–100.00)

## 2016-06-15 LAB — CBC WITH DIFFERENTIAL/PLATELET
BASOS ABS: 0 10*3/uL (ref 0.0–0.1)
Basophils Relative: 0.3 % (ref 0.0–3.0)
EOS ABS: 0.1 10*3/uL (ref 0.0–0.7)
Eosinophils Relative: 1.7 % (ref 0.0–5.0)
HCT: 39.9 % (ref 36.0–46.0)
HEMOGLOBIN: 13.2 g/dL (ref 12.0–15.0)
LYMPHS ABS: 1.3 10*3/uL (ref 0.7–4.0)
Lymphocytes Relative: 14.9 % (ref 12.0–46.0)
MCHC: 33 g/dL (ref 30.0–36.0)
MCV: 92.2 fl (ref 78.0–100.0)
MONO ABS: 0.7 10*3/uL (ref 0.1–1.0)
Monocytes Relative: 8.4 % (ref 3.0–12.0)
NEUTROS PCT: 74.7 % (ref 43.0–77.0)
Neutro Abs: 6.4 10*3/uL (ref 1.4–7.7)
Platelets: 229 10*3/uL (ref 150.0–400.0)
RBC: 4.32 Mil/uL (ref 3.87–5.11)
RDW: 15.7 % — ABNORMAL HIGH (ref 11.5–15.5)
WBC: 8.6 10*3/uL (ref 4.0–10.5)

## 2016-06-15 LAB — COMPREHENSIVE METABOLIC PANEL
ALBUMIN: 4 g/dL (ref 3.5–5.2)
ALT: 13 U/L (ref 0–35)
AST: 18 U/L (ref 0–37)
Alkaline Phosphatase: 42 U/L (ref 39–117)
BUN: 25 mg/dL — ABNORMAL HIGH (ref 6–23)
CALCIUM: 10 mg/dL (ref 8.4–10.5)
CHLORIDE: 104 meq/L (ref 96–112)
CO2: 28 meq/L (ref 19–32)
Creatinine, Ser: 1.18 mg/dL (ref 0.40–1.20)
GFR: 45.59 mL/min — ABNORMAL LOW (ref >60.00)
Glucose, Bld: 87 mg/dL (ref 70–99)
Potassium: 4.6 mEq/L (ref 3.5–5.1)
Sodium: 139 mEq/L (ref 135–145)
Total Bilirubin: 0.5 mg/dL (ref 0.2–1.2)
Total Protein: 6.9 g/dL (ref 6.0–8.3)

## 2016-06-15 LAB — LIPASE: LIPASE: 46 U/L (ref 11.0–59.0)

## 2016-06-15 LAB — VITAMIN B12: VITAMIN B 12: 467 pg/mL (ref 211–911)

## 2016-06-15 LAB — T4, FREE: FREE T4: 1.07 ng/dL (ref 0.60–1.60)

## 2016-06-15 LAB — T3, FREE: T3, Free: 3.1 pg/mL (ref 2.3–4.2)

## 2016-06-15 LAB — TSH: TSH: 2.16 u[IU]/mL (ref 0.35–4.50)

## 2016-06-15 MED ORDER — METFORMIN HCL 500 MG PO TABS: 500.0000 mg | ORAL_TABLET | Freq: Every day | ORAL | 3 refills | Status: DC

## 2016-06-15 NOTE — Assessment & Plan Note (Signed)
Most likely GARD/ gastritis but will eval with labs.  Son with pancreatic cancer.

## 2016-06-15 NOTE — Progress Notes (Signed)
Pre visit review using our clinic review tool, if applicable. No additional management support is needed unless otherwise documented below in the visit note. 

## 2016-06-15 NOTE — Assessment & Plan Note (Signed)
Continue back on nexium 40 mg daily. Trigger avoidance.

## 2016-06-15 NOTE — Assessment & Plan Note (Signed)
No clear sign of sinus infection.. Most likely due to allergies. Consider adding oral antihistamine.

## 2016-06-15 NOTE — Assessment & Plan Note (Signed)
Eval with labs. 

## 2016-06-15 NOTE — Progress Notes (Signed)
   Subjective:    Patient ID: [name], female    DOB: January 20, 1923, 83 y.o.   MRN: [id number]  HPI s  83 year old female presents  with new onset upper abdominal pain. She has history of GERD and is on PPI. She restarted nexium in last 5 days.  She reports she had an episode of in epigastrum 2 days ago. She had eaten grapefruit  Some sour taste in mouth. No nausea.  She tried citrucel, miralax. She is having regular BMs, no straining.  Now she feels better.  no blood in stool.  She is more tired in last month.. Falls asleep a lot during the day.    She has also had some sinus pressure and nose pain. Occ pressure behind eyes.  Using flonase 2 sprays per nostril.  No fever, no SOB.  She does have post nasal drip.  No nasal discharge.     Review of Systems  Constitutional: Positive for fatigue. Negative for fever.  HENT: Negative for ear pain.   Eyes: Positive for pain. Negative for photophobia, discharge and itching.  Respiratory: Negative for chest tightness and shortness of breath.   Cardiovascular: Negative for chest pain, palpitations and leg swelling.  Gastrointestinal: Negative for abdominal pain.  Genitourinary: Negative for dysuria.       Objective:   Physical Exam  Constitutional: Vital signs are normal. She appears well-developed and well-nourished. She is cooperative.  Non-toxic appearance. She does not appear ill. No distress.  HENT:  Head: Normocephalic.  Right Ear: Hearing, tympanic membrane, external ear and ear canal normal. Tympanic membrane is not erythematous, not retracted and not bulging.  Left Ear: Hearing, tympanic membrane, external ear and ear canal normal. Tympanic membrane is not erythematous, not retracted and not bulging.  Nose: No mucosal edema or rhinorrhea. Right sinus exhibits no maxillary sinus tenderness and no frontal sinus tenderness. Left sinus exhibits no maxillary sinus tenderness and no frontal sinus tenderness.  Mouth/Throat:  Uvula is midline, oropharynx is clear and moist and mucous membranes are normal.  Eyes: Conjunctivae, EOM and lids are normal. Pupils are equal, round, and reactive to light. Lids are everted and swept, no foreign bodies found.  Neck: Trachea normal and normal range of motion. Neck supple. Carotid bruit is not present. No thyroid mass and no thyromegaly present.  Cardiovascular: Normal rate, regular rhythm, S1 normal, S2 normal, normal heart sounds, intact distal pulses and normal pulses.  Exam reveals no gallop and no friction rub.   No murmur heard. Pulmonary/Chest: Effort normal and breath sounds normal. No tachypnea. No respiratory distress. She has no decreased breath sounds. She has no wheezes. She has no rhonchi. She has no rales.  Abdominal: Soft. Normal appearance and bowel sounds are normal. There is no hepatosplenomegaly. There is tenderness in the epigastric area. There is no rigidity, no rebound, no guarding and no CVA tenderness. No hernia.  Neurological: She is alert.  Skin: Skin is warm, dry and intact. No rash noted.  Psychiatric: Her speech is normal and behavior is normal. Judgment and thought content normal. Her mood appears not anxious. Cognition and memory are normal. She does not exhibit a depressed mood.   Body mass index is 21.57 kg/m.  Wt Readings from Last 3 Encounters:  06/15/16 121 lb 12 oz (55.2 kg)  05/12/16 124 lb (56.2 kg)  04/06/16 128 lb 12 oz (58.4 kg)          Assessment & Plan:

## 2016-06-15 NOTE — Patient Instructions (Addendum)
Can try zyrtec at night for allergies and sinus pressure. Avoid triggers of reflux.. No citris, tomatos, spicy, carbonation, alcohol, chocolate, caffeine.  Continue nexium 40 mg ( 2 X 20 mg) daily.  Please stop at the front desk or lab to set up referral or to have labs drawn.

## 2016-06-19 ENCOUNTER — Ambulatory Visit (INDEPENDENT_AMBULATORY_CARE_PROVIDER_SITE_OTHER): Payer: Medicare Other | Admitting: Primary Care

## 2016-06-19 VITALS — BP 124/76 | HR 84 | Temp 97.7°F | Ht 63.0 in | Wt 123.8 lb

## 2016-06-19 DIAGNOSIS — R1012 Left upper quadrant pain: Secondary | ICD-10-CM | POA: Diagnosis not present

## 2016-06-19 NOTE — Patient Instructions (Signed)
Your symptoms could be related to constipation.  Take Miralax once daily with a full glass of water for the rest of this week. Continue taking your Nexium daily.  Please call me Thursday this week if you experience increased pain, fevers, nausea/vomiting, diarrhea.  Ensure you are consuming 8 glasses of water daily.   It was a pleasure to see you today!

## 2016-06-19 NOTE — Progress Notes (Signed)
Subjective:    Patient ID: [name], female    DOB: [birth year], 83 y.o.   MRN: [id number]  HPI  [name] is a 83 year old female with a history of GERD, constipation who presents today with a chief complaint of abdominal pain. She was evaluated on 06/15/16 for new onset of upper abdominal pain with sour taste in her mouth. She restarted her Nexium 5 days prior to her visit on 02/22. She underwent lab work during that visit with normal lipase, no elevation to LFT's, and no leukocytosis on CBC.   Since her visit on 02/22 she's continued to experience abdominal pain which has moved to the LUQ. Her pain is constant for which she describes as a dull ache. She's been taking Citrucel twice daily, Miralax yesterday, and increased water intake. She's been taking her Nexium since her last visit. Overall she's feeling better than she was last week. She denies nausea, vomiting, fevers, diarrhea, bloody stools, recent use of NSAID's. She has been straining her bowels recently.   Review of Systems  Respiratory: Negative for shortness of breath.   Cardiovascular: Negative for chest pain.  Gastrointestinal: Positive for abdominal pain and constipation. Negative for blood in stool, diarrhea, nausea and vomiting.  Neurological: Negative for dizziness.       Past Medical History:  Diagnosis Date  . Acute pharyngitis   . Acute sinusitis, unspecified   . Acute upper respiratory infections of unspecified site   . Allergy   . Anal and rectal polyp   . Cataract    History of  . Cavernous angioma   . Degenerative cervical disc 02/2006  . Diabetes mellitus    Type II  . GERD (gastroesophageal reflux disease)   . Gout   . Hemangioma of unspecified site   . Hyperlipidemia   . Hypertension   . Orthostatic hypotension 04/2006   [facility]  . Osteoarthritis   . Osteopenia   . Other abscess of vulva   . Palpitations   . Syncope and collapse    1 episode     Social History   Social History    . Marital status: Married    Spouse name: N/A  . Number of children: 3  . Years of education: N/A   Occupational History  . 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 Smoking status: Former Research scientist (life sciences)  . Smokeless tobacco: Never Used     Comment: quit 1970  . Alcohol use No  . Drug use: No  . Sexual activity: Not on file   Other Topics Concern  . Not on file   Social History Narrative   Diet: fruit and veggies, water, lean protein.   Activity limited secondary to knee discomfort/arthritis, but going to church exersice group two times a week.                 Past Surgical History:  Procedure Laterality Date  . ABDOMINAL HYSTERECTOMY    . Aortic dopplers     Mild plaque.  EEG okay  . CATARACT EXTRACTION    . Cavernous angioma    . Cavernous hemangioma  04/2004   Hospital  . TOTAL KNEE ARTHROPLASTY     Right    Family History  Problem Relation Age of Onset  . Heart disease Other     CHF  . Cancer Maternal Aunt     Breast CA    Allergies  Allergen Reactions  .  Atorvastatin     REACTION: intolerant  . Codeine     REACTION: GI upset  . Rosuvastatin     REACTION: intolerant    Current Outpatient Prescriptions on File Prior to Visit  Medication Sig Dispense Refill  . ACCU-CHEK AVIVA PLUS test strip USE TO CHECK BLOOD SUGAR DAILY AS NEEDED. DX: E11.9 100 each 3  . aspirin EC 81 MG tablet Take 40.5 mg by mouth at bedtime.     . Blood Glucose Monitoring Suppl (ACCU-CHEK AVIVA PLUS) W/DEVICE KIT Use to check blood sugar daily as needed.  Dx: E11.9 1 kit 0  . Calcium Citrate (CITRACAL PO) Take 1 capsule by mouth daily.     Marland Kitchen esomeprazole (NEXIUM) 40 MG capsule     . fish oil-omega-3 fatty acids 1000 MG capsule Take 1 g by mouth daily.      . fluticasone (FLONASE) 50 MCG/ACT nasal spray PLACE 2 SPRAYS INTO BOTH NOSTRILS DAILY. 16 g 3  . Lancets (FREESTYLE) lancets CHECK BLOOD SUGAR 1-2 TIMES A DAY. DX: E11.9 NON  INSULIN DEPENDENT 100 each 3  . metFORMIN (GLUCOPHAGE) 500 MG tablet Take 1 tablet (500 mg total) by mouth daily with breakfast. 90 tablet 3  . Multiple Vitamin (MULTIVITAMIN) tablet Take 1 tablet by mouth.    Vladimir Faster Glycol-Propyl Glycol (SYSTANE OP) Apply 1 drop to eye daily as needed (for dry eyes).    . ramipril (ALTACE) 5 MG capsule TAKE 1 CAPSULE (5 MG TOTAL) BY MOUTH DAILY. 90 capsule 1   Current Facility-Administered Medications on File Prior to Visit  Medication Dose Route Frequency Provider Last Rate Last Dose  . denosumab (PROLIA) injection 60 mg  60 mg Subcutaneous Q6 months Amy E Diona Browner, MD   60 mg at 03/14/16 1431    BP 124/76   Pulse 84   Temp 97.7 F (36.5 C) (Oral)   Ht _0  (1.6 m)   Wt 123 lb 12.8 oz (56.2 kg)   SpO2 (!) 84%   BMI 21.93 kg/m    Objective:   Physical Exam  Constitutional: She appears well-nourished. She does not appear ill.  Neck: Neck supple.  Cardiovascular: Normal rate and regular rhythm.   Pulmonary/Chest: Effort normal and breath sounds normal.  Abdominal: Soft. Normal appearance and bowel sounds are normal. There is tenderness in the epigastric area, left upper quadrant and left lower quadrant.  Mildly tender          Assessment & Plan:  Abdominal Pain:  Located mostly to left abdomen, overall improved from last visit. No alarm signs. Does not appear acutely ill. Do note history of diverticulosis, given recent normal labs and presentation today doubt this is cause, but will keep in mind. Suspect constipation.  Will have her restart Miralax daily for the rest of this week. Continue water consumption.  Discussed to call if she develops fevers, diarrhea, bloody stools, weakness. Given that she's feeling better and appears stable, she is stable for outpatient treatment.  Sheral Flow, NP

## 2016-06-19 NOTE — Progress Notes (Signed)
Pre visit review using our clinic review tool, if applicable. No additional management support is needed unless otherwise documented below in the visit note. 

## 2016-06-21 ENCOUNTER — Telehealth: Payer: Self-pay

## 2016-06-21 NOTE — Telephone Encounter (Signed)
Pt left v/m; pt was seen on 06/19/16;  after pt eats she does get very tired and wanted to know what that meant; pt request cb.

## 2016-06-22 ENCOUNTER — Other Ambulatory Visit: Payer: Self-pay | Admitting: Family Medicine

## 2016-06-23 NOTE — Telephone Encounter (Signed)
Higher carb meals can make you feel tired after you eat. Try to increase protein and fiber in each meal.

## 2016-06-23 NOTE — Telephone Encounter (Signed)
Left message for [name] that higher carb meals can make you feel tired after you eat.  Advised to try and increase protein and fiber in each meal.

## 2016-06-23 NOTE — Telephone Encounter (Signed)
Left message for Ms. Hunzeker to return my call. 

## 2016-06-26 NOTE — Telephone Encounter (Signed)
Pt left v/m; pt appreciative for Butch Penny calling her on Fri with good advise. Pt is feeling better; pt limiting carbs; FBS in the 90's but today FBS is 78. Pt wanted Dr Diona Browner to be aware and pt request cb with any further advice.

## 2016-06-27 NOTE — Telephone Encounter (Signed)
No further recommendations.

## 2016-07-26 ENCOUNTER — Telehealth: Payer: Self-pay | Admitting: *Deleted

## 2016-07-26 NOTE — Telephone Encounter (Signed)
Information has been submitted to pts insurance for verification of benefits. Awaiting response for coverage  

## 2016-07-26 NOTE — Telephone Encounter (Signed)
Noted  

## 2016-08-01 ENCOUNTER — Encounter: Payer: Self-pay | Admitting: Podiatry

## 2016-08-01 ENCOUNTER — Ambulatory Visit (INDEPENDENT_AMBULATORY_CARE_PROVIDER_SITE_OTHER): Payer: Medicare Other | Admitting: Podiatry

## 2016-08-01 DIAGNOSIS — M79676 Pain in unspecified toe(s): Secondary | ICD-10-CM

## 2016-08-01 DIAGNOSIS — B351 Tinea unguium: Secondary | ICD-10-CM | POA: Diagnosis not present

## 2016-08-01 DIAGNOSIS — E119 Type 2 diabetes mellitus without complications: Secondary | ICD-10-CM

## 2016-08-01 NOTE — Progress Notes (Signed)
Patient ID: [name], female   DOB: 05-25-1922, 83 y.o.   MRN: [id number]   Subjective: This patient presents today complaining of uncomfortable toenails walking wearing shoes and requests toenail debridement. Patient is known diabetic and does denies any history of amputation, claudication or amputation  Objective: Orientated 3 DP and PT pulses 2/4 bilaterally Capillary reflex immediate bilaterally Sensation to 10 g monofilament wire intact 4/5 right and 5/5 left Vibratory sensation reactive bilaterally Ankle reflex equal and reactive bilaterally No open skin lesions bilaterally Atrophic skin, without any hair growth bilaterally The toenails are elongated, discolored, hypertrophic and tender to direct palpation 6-10 HAV bilaterally Hammertoe second bilaterally  Assessment: Diabetic with satisfactory neurovascular status Symptomatic onychomycoses 6-10  Plan: Debridement toenails 6-10 mechanically and electrically. Without any bleeding  Reappoint 3 months

## 2016-08-01 NOTE — Patient Instructions (Signed)

## 2016-08-07 ENCOUNTER — Other Ambulatory Visit: Payer: Self-pay | Admitting: *Deleted

## 2016-08-07 NOTE — Telephone Encounter (Signed)
Received fax from [facility] asking if we can change to omeprazole 40 mg.  Cheaper alternative on insurance for patient.  Please advise.

## 2016-08-08 MED ORDER — OMEPRAZOLE 40 MG PO CPDR: 40.0000 mg | DELAYED_RELEASE_CAPSULE | Freq: Every day | ORAL | 3 refills | Status: DC

## 2016-08-09 NOTE — Telephone Encounter (Signed)
Verification of benefits have been processed and an approval has been received for pts prolia injection. Pts estimated cost are appx $0. This is only an estimate and cannot be confirmed until benefits are paid. Please advise pt and schedule if needed. If scheduled, once the injection is received, pls contact me back with the date it was received so that I am able to update prolia folder. thanks   [facility] to schedule Ca lab and injection can be scheduled to be received after 09/12/16

## 2016-08-09 NOTE — Telephone Encounter (Signed)
Spoke with Ms. [name].  She is scheduled to see Dr. Diona Browner on 08/11/2016.  Will schedule her Lab & Prolia appointment for May while she is here on Friday.

## 2016-08-11 ENCOUNTER — Encounter: Payer: Self-pay | Admitting: Family Medicine

## 2016-08-11 ENCOUNTER — Ambulatory Visit (INDEPENDENT_AMBULATORY_CARE_PROVIDER_SITE_OTHER): Payer: Medicare Other | Admitting: Family Medicine

## 2016-08-11 VITALS — BP 108/60 | HR 83 | Temp 97.8°F | Ht 63.0 in | Wt 124.2 lb

## 2016-08-11 DIAGNOSIS — M810 Age-related osteoporosis without current pathological fracture: Secondary | ICD-10-CM | POA: Diagnosis not present

## 2016-08-11 DIAGNOSIS — G5602 Carpal tunnel syndrome, left upper limb: Secondary | ICD-10-CM | POA: Insufficient documentation

## 2016-08-11 DIAGNOSIS — K219 Gastro-esophageal reflux disease without esophagitis: Secondary | ICD-10-CM | POA: Diagnosis not present

## 2016-08-11 DIAGNOSIS — E119 Type 2 diabetes mellitus without complications: Secondary | ICD-10-CM | POA: Diagnosis not present

## 2016-08-11 NOTE — Patient Instructions (Signed)
Change nexium to omeprazole.  Call if not helping as much as neium  Keep appt for prolia. Wear carpal tunnel brace on left at night.

## 2016-08-11 NOTE — Progress Notes (Signed)
Pre visit review using our clinic review tool, if applicable. No additional management support is needed unless otherwise documented below in the visit note. 

## 2016-08-11 NOTE — Progress Notes (Signed)
See scanned in note for complete HPI, H, ROS, PE and A/P.  Written note as EMR was down at time of OV.

## 2016-08-16 DIAGNOSIS — H18892 Other specified disorders of cornea, left eye: Secondary | ICD-10-CM | POA: Diagnosis not present

## 2016-08-16 DIAGNOSIS — H04123 Dry eye syndrome of bilateral lacrimal glands: Secondary | ICD-10-CM | POA: Diagnosis not present

## 2016-08-29 ENCOUNTER — Telehealth: Payer: Self-pay | Admitting: Family Medicine

## 2016-08-29 NOTE — Telephone Encounter (Signed)
Pt has appt with Dr Silvio Pate 08/30/16 at 9:45.

## 2016-08-29 NOTE — Telephone Encounter (Signed)
Patient Name: [name] DOB: September 24, 1922 Initial Comment caller states she has diabetes . Her blood sugar is 192 . She is tired an wants to know what she can do Nurse Assessment Nurse: Erling Cruz, RN, Morey Hummingbird Date/Time (Eastern Time): 08/29/2016 2:46:27 PM Confirm and document reason for call. If symptomatic, describe symptoms. ---Caller states she has diabetes. Her blood sugar is 192 about 5 min ago. She is tired and wants to know what she can do. Takes Metformin only; not on insulin. Fatigue, no blurry vision. Does the patient have any new or worsening symptoms? ---Yes Will a triage be completed? ---Yes Related visit to physician within the last 2 weeks? ---No Does the PT have any chronic conditions? (i.e. diabetes, asthma, etc.) ---Yes List chronic conditions. ---DM, HTN Is this a behavioral health or substance abuse call? ---No Guidelines Guideline Title Affirmed Question Affirmed Notes Diabetes - High Blood Sugar Blood glucose 60-240 mg/dl (3.5 -13 mmol/ l) Final Disposition User See Physician within 24 Hours Love, RN, Morey Hummingbird Comments Appointment scheduled for 9:45am tomorrow morning. Referrals REFERRED TO PCP OFFICE Disagree/Comply: Comply Disagree/Comply: Comply

## 2016-08-30 ENCOUNTER — Ambulatory Visit (INDEPENDENT_AMBULATORY_CARE_PROVIDER_SITE_OTHER): Payer: Medicare Other | Admitting: Internal Medicine

## 2016-08-30 ENCOUNTER — Encounter: Payer: Self-pay | Admitting: Internal Medicine

## 2016-08-30 VITALS — BP 118/66 | HR 70 | Temp 97.4°F | Wt 123.5 lb

## 2016-08-30 DIAGNOSIS — R5383 Other fatigue: Secondary | ICD-10-CM

## 2016-08-30 NOTE — Telephone Encounter (Signed)
Will evaluate at the OV 

## 2016-08-30 NOTE — Progress Notes (Signed)
Pre visit review using our clinic review tool, if applicable. No additional management support is needed unless otherwise documented below in the visit note. 

## 2016-08-30 NOTE — Assessment & Plan Note (Signed)
Ongoing vague complaints Not new No fever or signs of acute illness Has had some low sugars in AM (75) and low A1c She is resistant to stopping the metformin though--concerned about high sugars Recent labs otherwise reassuring and her exam is as well  Discussed trying without the metformin Can monitor AM sugars only and go back on 1/2 again if running up She can check in with Dr Diona Browner

## 2016-08-30 NOTE — Progress Notes (Signed)
Subjective:    Patient ID: [name], female    DOB: 07-24-1922, 83 y.o.   MRN: [id number]  HPI Here due to feeling tired 2 days ago felt tired--but still did what she needed to Felt a little better yesterday--- able to do her exercise Then started feeling worse--so worried about her sugar----she checked it in AM and it was 75 in Am and then up to 195 (~4:15PM--- >3 hours after eating). Increased fluids as instructed and came back down under 100  Feels tired today but better overall  No chest pain No SOB No dizziness or syncope  Current Outpatient Prescriptions on File Prior to Visit  Medication Sig Dispense Refill  . ACCU-CHEK AVIVA PLUS test strip USE TO CHECK BLOOD SUGAR DAILY AS NEEDED. DX: E11.9 100 each 3  . aspirin EC 81 MG tablet Take 40.5 mg by mouth at bedtime.     . Blood Glucose Monitoring Suppl (ACCU-CHEK AVIVA PLUS) W/DEVICE KIT Use to check blood sugar daily as needed.  Dx: E11.9 1 kit 0  . Calcium Citrate (CITRACAL PO) Take 1 capsule by mouth daily.     . fish oil-omega-3 fatty acids 1000 MG capsule Take 1 g by mouth daily.      . fluticasone (FLONASE) 50 MCG/ACT nasal spray PLACE 2 SPRAYS INTO BOTH NOSTRILS DAILY. 16 g 3  . Lancets (FREESTYLE) lancets CHECK BLOOD SUGAR 1-2 TIMES A DAY. DX: E11.9 NON INSULIN DEPENDENT 100 each 3  . metFORMIN (GLUCOPHAGE) 500 MG tablet Take 1 tablet (500 mg total) by mouth daily with breakfast. 90 tablet 3  . Multiple Vitamin (MULTIVITAMIN) tablet Take 1 tablet by mouth.    Marland Kitchen omeprazole (PRILOSEC) 40 MG capsule Take 1 capsule (40 mg total) by mouth daily. 90 capsule 3  . Polyethyl Glycol-Propyl Glycol (SYSTANE OP) Apply 1 drop to eye daily as needed (for dry eyes).    . ramipril (ALTACE) 5 MG capsule TAKE 1 CAPSULE (5 MG TOTAL) BY MOUTH DAILY. 90 capsule 1   Current Facility-Administered Medications on File Prior to Visit  Medication Dose Route Frequency Provider Last Rate Last Dose  . denosumab (PROLIA) injection 60 mg  60 mg  Subcutaneous Q6 months Bedsole, Amy E, MD   60 mg at 03/14/16 1431    Allergies  Allergen Reactions  . Atorvastatin     REACTION: intolerant  . Codeine     REACTION: GI upset  . Rosuvastatin     REACTION: intolerant    Past Medical History:  Diagnosis Date  . Acute pharyngitis   . Acute sinusitis, unspecified   . Acute upper respiratory infections of unspecified site   . Allergy   . Anal and rectal polyp   . Cataract    History of  . Cavernous angioma   . Degenerative cervical disc 02/2006  . Diabetes mellitus    Type II  . GERD (gastroesophageal reflux disease)   . Gout   . Hemangioma of unspecified site   . Hyperlipidemia   . Hypertension   . Orthostatic hypotension 04/2006   [facility]ospital  . Osteoarthritis   . Osteopenia   . Other abscess of vulva   . Palpitations   . Syncope and collapse    1 episode    Past Surgical History:  Procedure Laterality Date  . ABDOMINAL HYSTERECTOMY    . Aortic dopplers     Mild plaque.  EEG okay  . CATARACT EXTRACTION    . Cavernous angioma    .  Cavernous hemangioma  04/2004   Hospital  . TOTAL KNEE ARTHROPLASTY     Right    Family History  Problem Relation Age of Onset  . Heart disease Other     CHF  . Cancer Maternal Aunt     Breast CA    Social History   Social History  . Marital status: Married    Spouse name: N/A  . Number of children: 3  . Years of education: N/A   Occupational History  . 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 Smoking status: Former Research scientist (life sciences)  . Smokeless tobacco: Never Used     Comment: quit 1970  . Alcohol use No  . Drug use: No  . Sexual activity: Not on file   Other Topics Concern  . Not on file   Social History Narrative   Diet: fruit and veggies, water, lean protein.   Activity limited secondary to knee discomfort/arthritis, but going to church exersice group two times a week.                Review of Systems No diarrhea  or vomiting No fever Appetite is off---nothing tastes good Does have fatigue a lot of the time--better after resting    Objective:   Physical Exam  Constitutional: No distress.  HENT:  Mouth/Throat: Oropharynx is clear and moist. No oropharyngeal exudate.  Neck: No thyromegaly present.  Cardiovascular: Normal rate, regular rhythm, normal heart sounds and intact distal pulses.  Exam reveals no gallop.   No murmur heard. Pulmonary/Chest: Effort normal and breath sounds normal. No respiratory distress. She has no wheezes. She has no rales.  Abdominal: Soft. There is no tenderness.  Musculoskeletal: She exhibits no edema or tenderness.  Lymphadenopathy:    She has no cervical adenopathy.  Psychiatric: She has a normal mood and affect. Her behavior is normal.          Assessment & Plan:

## 2016-08-30 NOTE — Patient Instructions (Signed)
Please stop the metformin for the next week or so to see how you feel. If the fatigue is better, and your morning sugars stay under 180--you can stay off it. If your morning sugars go over 180, restart with just 1/2 tab daily. Please check in with Dr Diona Browner with any questions.

## 2016-09-01 ENCOUNTER — Telehealth: Payer: Self-pay

## 2016-09-01 NOTE — Telephone Encounter (Signed)
Pt left v/m; pt was seen 08/30/16; Dr Silvio Pate advised pt not to take metformin for next week or so to see how she feels. If fatigue is better and FBS is under 180 stay off metformin but if FBS is higher than 180 restart metformin 1/2 tab daily. Pt did not take metformin on 08/31/16 and today FBS was 125. Wanted Dr Diona Browner and Butch Penny to know; pt request cb from [facility]. Pt will not be available from 9:15 - 12:30.

## 2016-09-01 NOTE — Telephone Encounter (Signed)
Okay to continue as recommended by D.r L.

## 2016-09-01 NOTE — Telephone Encounter (Signed)
Ms. Thornsberry notified that Dr. Diona Browner agreed with Dr. Alla German plan of stopping the metformin for the next week or so to see how she feels. If the fatigue is better, and her morning sugars stay under 180--she can stay off it. If her morning sugars go over 180, restart metformin with just 1/2 tab daily.

## 2016-09-11 ENCOUNTER — Other Ambulatory Visit (INDEPENDENT_AMBULATORY_CARE_PROVIDER_SITE_OTHER): Payer: Medicare Other

## 2016-09-11 DIAGNOSIS — M810 Age-related osteoporosis without current pathological fracture: Secondary | ICD-10-CM | POA: Diagnosis not present

## 2016-09-11 LAB — BASIC METABOLIC PANEL
BUN: 25 mg/dL — ABNORMAL HIGH (ref 6–23)
CHLORIDE: 104 meq/L (ref 96–112)
CO2: 27 mEq/L (ref 19–32)
Calcium: 10 mg/dL (ref 8.4–10.5)
Creatinine, Ser: 1.05 mg/dL (ref 0.40–1.20)
GFR: 52.13 mL/min — ABNORMAL LOW (ref >60.00)
Glucose, Bld: 109 mg/dL — ABNORMAL HIGH (ref 70–99)
POTASSIUM: 4.1 meq/L (ref 3.5–5.1)
Sodium: 137 mEq/L (ref 135–145)

## 2016-09-14 ENCOUNTER — Ambulatory Visit: Payer: Medicare Other

## 2016-09-28 ENCOUNTER — Encounter: Payer: Self-pay | Admitting: Adult Health

## 2016-09-28 ENCOUNTER — Ambulatory Visit (INDEPENDENT_AMBULATORY_CARE_PROVIDER_SITE_OTHER): Payer: Medicare Other | Admitting: Adult Health

## 2016-09-28 ENCOUNTER — Telehealth: Payer: Self-pay | Admitting: Family Medicine

## 2016-09-28 VITALS — BP 124/68 | HR 94 | Temp 98.3°F | Ht 63.0 in | Wt 124.3 lb

## 2016-09-28 DIAGNOSIS — S0990XA Unspecified injury of head, initial encounter: Secondary | ICD-10-CM | POA: Diagnosis not present

## 2016-09-28 NOTE — Progress Notes (Signed)
Subjective:    Patient ID: [name], female    DOB: 06-Oct-1922, 83 y.o.   MRN: [id number]  HPI  83 year old female who  has a past medical history of Acute pharyngitis; Acute sinusitis, unspecified; Acute upper respiratory infections of unspecified site; Allergy; Anal and rectal polyp; Cataract; Cavernous angioma; Degenerative cervical disc (02/2006); Diabetes mellitus; GERD (gastroesophageal reflux disease); Gout; Hemangioma of unspecified site; Hyperlipidemia; Hypertension; Orthostatic hypotension (04/2006); Osteoarthritis; Osteopenia; Other abscess of vulva; Palpitations; and Syncope and collapse.   She is a patient of Dr. Diona Browner, who I am seeing today for the first time. Her daughter is with he today to help provide history   She reports that earlier today she was outside and when she went inside she had a mechanical fall from a trip, hitting the left side of her head on a table. There was no LOC and she remembers the entire incident. She does endorse feeling dizzy for about two hours afterwards. She had a small hematoma after the incident and she placed ice on her head which made her head feel " much better".   She denies any headaches, blurred vision, slurred speech, facial droop, nausea or vomiting.   She also reports that she hit her right knee but that this is no longer painful   She takes ASA 81 mg at night   Review of Systems See HPI   Past Medical History:  Diagnosis Date  . Acute pharyngitis   . Acute sinusitis, unspecified   . Acute upper respiratory infections of unspecified site   . Allergy   . Anal and rectal polyp   . Cataract    History of  . Cavernous angioma   . Degenerative cervical disc 02/2006  . Diabetes mellitus    Type II  . GERD (gastroesophageal reflux disease)   . Gout   . Hemangioma of unspecified site   . Hyperlipidemia   . Hypertension   . Orthostatic hypotension 04/2006   Hospital  . Osteoarthritis   . Osteopenia   . Other  abscess of vulva   . Palpitations   . Syncope and collapse    1 episode    Social History   Social History  . Marital status: Married    Spouse name: N/A  . Number of children: 3  . Years of education: N/A   Occupational History  . 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 Smoking status: Former Research scientist (life sciences)  . Smokeless tobacco: Never Used     Comment: quit 1970  . Alcohol use No  . Drug use: No  . Sexual activity: Not on file   Other Topics Concern  . Not on file   Social History Narrative   Diet: fruit and veggies, water, lean protein.   Activity limited secondary to knee discomfort/arthritis, but going to church exersice group two times a week.                 Past Surgical History:  Procedure Laterality Date  . ABDOMINAL HYSTERECTOMY    . Aortic dopplers     Mild plaque.  EEG okay  . CATARACT EXTRACTION    . Cavernous angioma    . Cavernous hemangioma  04/2004   Hospital  . TOTAL KNEE ARTHROPLASTY     Right    Family History  Problem Relation Age of Onset  . Heart disease Other  CHF  . Cancer Maternal Aunt        Breast CA    Allergies  Allergen Reactions  . Atorvastatin     REACTION: intolerant  . Codeine     REACTION: GI upset  . Rosuvastatin     REACTION: intolerant    Current Outpatient Prescriptions on File Prior to Visit  Medication Sig Dispense Refill  . ACCU-CHEK AVIVA PLUS test strip USE TO CHECK BLOOD SUGAR DAILY AS NEEDED. DX: E11.9 100 each 3  . aspirin EC 81 MG tablet Take 40.5 mg by mouth at bedtime.     . Blood Glucose Monitoring Suppl (ACCU-CHEK AVIVA PLUS) W/DEVICE KIT Use to check blood sugar daily as needed.  Dx: E11.9 1 kit 0  . Calcium Citrate (CITRACAL PO) Take 1 capsule by mouth daily.     . fish oil-omega-3 fatty acids 1000 MG capsule Take 1 g by mouth daily.      . fluticasone (FLONASE) 50 MCG/ACT nasal spray PLACE 2 SPRAYS INTO BOTH NOSTRILS DAILY. 16 g 3  .  Lancets (FREESTYLE) lancets CHECK BLOOD SUGAR 1-2 TIMES A DAY. DX: E11.9 NON INSULIN DEPENDENT 100 each 3  . Multiple Vitamin (MULTIVITAMIN) tablet Take 1 tablet by mouth.    Marland Kitchen omeprazole (PRILOSEC) 40 MG capsule Take 1 capsule (40 mg total) by mouth daily. 90 capsule 3  . Polyethyl Glycol-Propyl Glycol (SYSTANE OP) Apply 1 drop to eye daily as needed (for dry eyes).    . ramipril (ALTACE) 5 MG capsule TAKE 1 CAPSULE (5 MG TOTAL) BY MOUTH DAILY. 90 capsule 1  . metFORMIN (GLUCOPHAGE) 500 MG tablet Take 1 tablet (500 mg total) by mouth daily with breakfast. (Patient not taking: Reported on 09/28/2016) 90 tablet 3   Current Facility-Administered Medications on File Prior to Visit  Medication Dose Route Frequency Provider Last Rate Last Dose  . denosumab (PROLIA) injection 60 mg  60 mg Subcutaneous Q6 months Bedsole, Amy E, MD   60 mg at 03/14/16 1431    BP 124/68 (BP Location: Left Arm, Patient Position: Sitting, Cuff Size: Normal)   Pulse 94   Temp 98.3 F (36.8 C) (Oral)   Ht _0  (1.6 m)   Wt 124 lb 4.8 oz (56.4 kg)   SpO2 98%   BMI 22.02 kg/m       Objective:   Physical Exam  Constitutional: She is oriented to person, place, and time. She appears well-developed and well-nourished. No distress.  HENT:  Head: Atraumatic.  Right Ear: Tympanic membrane, external ear and ear canal normal.  Left Ear: Hearing, tympanic membrane, external ear and ear canal normal.  Nose: Nose normal.  Mouth/Throat: Oropharynx is clear and moist. No oropharyngeal exudate.  No battle sign, Hemotympanum, or racoon eyes.   Eyes: Conjunctivae and EOM are normal. Pupils are equal, round, and reactive to light.  Neck: Normal range of motion. Neck supple. No thyromegaly present.  Cardiovascular: Normal rate, regular rhythm, normal heart sounds and intact distal pulses.  Exam reveals no gallop and no friction rub.   No murmur heard. Pulmonary/Chest: Effort normal and breath sounds normal. No respiratory  distress. She has no wheezes. She has no rales. She exhibits no tenderness.  Lymphadenopathy:    She has no cervical adenopathy.  Neurological: She is alert and oriented to person, place, and time. She has normal strength. She displays normal reflexes. No cranial nerve deficit. She exhibits normal muscle tone. Coordination normal. GCS eye subscore is 4. GCS verbal subscore is  5. GCS motor subscore is 6.  Skin: Skin is warm and dry. No rash noted. She is not diaphoretic. No erythema. No pallor.  Small 2 cm abrasion to left parietal scalp. Bruising noted to left side of parietal scalp  Psychiatric: She has a normal mood and affect. Her behavior is normal. Judgment and thought content normal.  Nursing note and vitals reviewed.     Assessment & Plan:  1. Injury of head, initial encounter - I do not think a CT is warranted at this time - Neuro intact. No signs of trauma except for small abrasion and minimal bruising.  - Daughter is going to stay with her over night. Advised to go to the ER with any signs of headache, blurred vision, confusion, facial droop, slurred speech, etc.  - Can use ice for symptoms relief.  - Do not take ASA tonight  - Follow up with PCP as needed  Dorothyann Peng, NP

## 2016-09-28 NOTE — Telephone Encounter (Signed)
Patient called back.  Patient said she put ice on her head and the knot went down.  She said she's not in pain and she's feeling fine. Patient said she feels well enough to go to her dental appointment tomorrow.

## 2016-09-28 NOTE — Telephone Encounter (Signed)
Patient Name: [name]  DOB: 11-09-1922    Initial Comment Caller states, she tripped and fell and hit head on his desk. She feel dizziness. Verfied    Nurse Assessment  Nurse: Mallie Mussel, RN, Alveta Heimlich Date/Time [name] Time): 09/28/2016 10:02:44 AM  Confirm and document reason for call. If symptomatic, describe symptoms. ---Caller states that she tripped and fell about 1/2 hour ago and hit her head on her desk. She feels a little dizzy. She denies cuts and bleeding. She denies lumps or dents in her skull. She was not knocked unconscious. She also hit her right knee. She rates the pain as 2-3 on 0-10 scale. She denies numbness in the leg, foot and toes.  Does the patient have any new or worsening symptoms? ---Yes  Will a triage be completed? ---Yes  Related visit to physician within the last 2 weeks? ---No  Does the PT have any chronic conditions? (i.e. diabetes, asthma, etc.) ---Yes  List chronic conditions. ---diabetes, htn,  Is this a behavioral health or substance abuse call? ---No     Guidelines    Guideline Title Affirmed Question Affirmed Notes  Head Injury Scalp swelling, bruise or pain    Final Disposition User   [place], RN, Alveta Heimlich    Comments  She has a knot there about 2" long.  Caller states that she is scheduled to have a molar pulled tomorrow. She wants to know if she can still have this done. I advised her that I will send a note to the office for someone to call her back about that. She verbalized understanding.   Disagree/Comply: Comply

## 2016-09-28 NOTE — Telephone Encounter (Signed)
PLEASE NOTE: All timestamps contained within this report are represented as [place] Standard Time. CONFIDENTIALTY NOTICE: This fax transmission is intended only for the addressee. It contains information that is legally privileged, confidential or otherwise protected from use or disclosure. If you are not the intended recipient, you are strictly prohibited from reviewing, disclosing, copying using or disseminating any of this information or taking any action in reliance on or regarding this information. If you have received this fax in error, please notify us immediately by telephone so that we can arrange for its return to [place]. Phone: [phone], Toll-Free: [phone], Fax: [phone] Page: 1 of 2 Call Id: [id number] Bryan Patient Name: [name] Gender: Female DOB: [birth year] Age: 83 Y 29 M 21 D Return Phone Number: [phone] (Primary) Address: [address] City/State/Zip: [place] [address] Client [facility] - Client Client Site [facility] - Day Physician Eliezer Lofts - MD Who Is Calling Patient / Member / Family / Caregiver Call Type Triage / Clinical Relationship To Patient Self Return Phone Number [phone] (Primary) Chief Complaint HEAD INJURY - and not acting right. Change in behaviour after hitting head. Reason for Call Symptomatic / Request for [place] states, she tripped and fell and hit head on his desk. She feel dizziness. Verfied Appointment Disposition EMR Appointment Not Necessary Info pasted into Epic Yes Nurse Assessment Nurse: Mallie Mussel, RN, Alveta Heimlich Date/Time [name] Time): 09/28/2016 10:02:44 AM Confirm and document reason for call. If symptomatic, describe symptoms. ---Caller states that she tripped and fell about 1/2 hour ago and hit her head on her desk. She feels a  little dizzy. She denies cuts and bleeding. She denies lumps or dents in her skull. She was not knocked unconscious. She also hit her right knee. She rates the pain as 2-3 on 0-10 scale. She denies numbness in the leg, foot and toes. Does the PT have any chronic conditions? (i.e. diabetes, asthma, etc.) ---Yes List chronic conditions. ---diabetes, htn, Guidelines Guideline Title Affirmed Question Head Injury Scalp swelling, bruise or pain Disp. Time [name] Time) Disposition Final User 09/28/2016 10:13:05 AM Home Care Yes Mallie Mussel, RN, [facility] Advice Given Per Guideline HOME CARE: You should be able to treat this at home. REASSURANCE: It sounds like a scalp injury rather than a brain injury or concussion. Treatment at home should be safe. * Apply a cold pack or an ice bag (wrapped in a moist towel) to the area for 20 minutes. Repeat in 1 hour, then every 4 hours while awake. * Continue this for the first 48 hours after an injury (Reason: to reduce the swelling and pain). * The head-injured person should be observed closely during the first 2 hours following the injury. * The head-injured person should be awoken every 4 hours for the first 24 hours; check the ability to walk and talk. * For pain relief, take acetaminophen, ibuprofen, or naproxen. * Use the lowest amount that makes your pain feel better. * Some mild headache, mild dizziness and nausea are common. * Most head trauma only causes an injury to the scalp. * Pain and swelling usually begin to improve 2 or 3 days after an injury. * Swelling is usually gone in 7 days. Pain and tenderness at the site may take 1-2 weeks to completely resolve. * Severe headache persists over 2 hours after ice pack and pain medications *  Extremity weakness or numbness occurs * Slurred speech or blurred vision occurs * Vomiting occurs * You become worse. PLEASE NOTE: All timestamps contained within this report are represented as [place] Standard  Time. CONFIDENTIALTY NOTICE: This fax transmission is intended only for the addressee. It contains information that is legally privileged, confidential or otherwise protected from use or disclosure. If you are not the intended recipient, you are strictly prohibited from reviewing, disclosing, copying using or disseminating any of this information or taking any action in reliance on or regarding this information. If you have received this fax in error, please notify us immediately by telephone so that we can arrange for its return to [place]. Phone: [phone], Toll-Free: [phone], Fax: [phone] Page: 2 of 2 Call Id: [id number] Comments User: Reeves Forth, RN Date/Time [name] Time): 09/28/2016 10:08:32 AM She has a knot there about 2" long. User: Reeves Forth, RN Date/Time [name] Time): 09/28/2016 10:16:28 AM Caller states that she is scheduled to have a molar pulled tomorrow. She wants to know if she can still have this done. I advised her that I will send a note to the office for someone to call her back about that. She verbalized understanding.

## 2016-09-28 NOTE — Patient Instructions (Signed)
It was a pleasure meeting you today   I feel as though you are safe to have your tooth pulled tomorrow.   If you develop any issues with headaches, blurred vision, confusion, facial droop, or slurred speech, please go to the ER

## 2016-10-02 ENCOUNTER — Telehealth: Payer: Self-pay | Admitting: Family Medicine

## 2016-10-02 NOTE — Telephone Encounter (Signed)
Patient Name: [name]  DOB: 07/11/1922    Initial Comment Maiyah Goyne states she has an appt to have a molar pulled. She tripped and fell Thursday and hit her head. She had a knot and bruise on her head. She has rescheduled the appt for Wed. She wants to make sure it is ok for her to go forward with her dentist appt.   Nurse Assessment  Nurse: Raphael Gibney, RN, Vanita Ingles Date/Time (Eastern Time): 10/02/2016 9:56:44 AM  Confirm and document reason for call. If symptomatic, describe symptoms. ---Caller states she tripped and hit her head on her desk. Has used ice as she had a knot but the knot is tiny now. Has a small bruise. Has appt on Wednesday to have tooth pulled. She feels pretty good. Her knot itches occasionally. No headache.  Does the patient have any new or worsening symptoms? ---Yes  Will a triage be completed? ---Yes  Related visit to physician within the last 2 weeks? ---No  Does the PT have any chronic conditions? (i.e. diabetes, asthma, etc.) ---Yes  List chronic conditions. ---diabetes; HTN  Is this a behavioral health or substance abuse call? ---No     Guidelines    Guideline Title Affirmed Question Affirmed Notes  Head Injury Scalp swelling, bruise or pain    Final Disposition [place], RN, Vanita Ingles    Comments  pt is scheduled to have her bottom molar pulled on Wednesday with a dental surgeon. Wants to make sure it is ok to have tooth pulled after hitting her head last Thursday. Please call pt and let her know if pulling her tooth is ok.   Disagree/Comply: Comply

## 2016-10-02 NOTE — Telephone Encounter (Signed)
Patient called back and said she saw Dorothyann Peng last Thursday and he cleared patient for the procedure. Patient said she feels fine.

## 2016-10-03 NOTE — Telephone Encounter (Signed)
Ms. Wadle notified, by telephone,  that it is okay to have tooth pulled tomorrow per Dr. Diona Browner.

## 2016-10-03 NOTE — Telephone Encounter (Signed)
Pt called back and said she is feeling fine now. She is planning on going to the dentist tomorrow 10/04/16 unless you advise her otherwise. She is requesting a cb to clear her to go get her tooth pulled.

## 2016-10-03 NOTE — Telephone Encounter (Signed)
Okay to have tooth pulled.

## 2016-10-07 ENCOUNTER — Emergency Department (HOSPITAL_COMMUNITY): Payer: Medicare Other

## 2016-10-07 ENCOUNTER — Emergency Department (HOSPITAL_COMMUNITY)
Admission: EM | Admit: 2016-10-07 | Discharge: 2016-10-07 | Disposition: A | Discharge status: Home / Self Care | Payer: Medicare Other | Attending: Emergency Medicine | Admitting: Emergency Medicine

## 2016-10-07 ENCOUNTER — Ambulatory Visit (HOSPITAL_COMMUNITY)
Admission: EM | Admit: 2016-10-07 | Discharge: 2016-10-07 | Disposition: A | Discharge status: Nursing Home (Includes ICF) | Payer: Medicare Other

## 2016-10-07 ENCOUNTER — Encounter (HOSPITAL_COMMUNITY): Payer: Self-pay

## 2016-10-07 DIAGNOSIS — S0990XA Unspecified injury of head, initial encounter: Secondary | ICD-10-CM | POA: Diagnosis not present

## 2016-10-07 DIAGNOSIS — H538 Other visual disturbances: Secondary | ICD-10-CM | POA: Diagnosis not present

## 2016-10-07 DIAGNOSIS — Z7984 Long term (current) use of oral hypoglycemic drugs: Secondary | ICD-10-CM | POA: Diagnosis not present

## 2016-10-07 DIAGNOSIS — Z87891 Personal history of nicotine dependence: Secondary | ICD-10-CM | POA: Insufficient documentation

## 2016-10-07 DIAGNOSIS — I10 Essential (primary) hypertension: Secondary | ICD-10-CM | POA: Diagnosis not present

## 2016-10-07 DIAGNOSIS — R791 Abnormal coagulation profile: Secondary | ICD-10-CM | POA: Insufficient documentation

## 2016-10-07 DIAGNOSIS — H53122 Transient visual loss, left eye: Secondary | ICD-10-CM | POA: Diagnosis not present

## 2016-10-07 DIAGNOSIS — E119 Type 2 diabetes mellitus without complications: Secondary | ICD-10-CM | POA: Insufficient documentation

## 2016-10-07 DIAGNOSIS — H5712 Ocular pain, left eye: Secondary | ICD-10-CM | POA: Diagnosis not present

## 2016-10-07 DIAGNOSIS — Z7982 Long term (current) use of aspirin: Secondary | ICD-10-CM | POA: Insufficient documentation

## 2016-10-07 DIAGNOSIS — I639 Cerebral infarction, unspecified: Secondary | ICD-10-CM

## 2016-10-07 LAB — I-STAT TROPONIN, ED: Troponin i, poc: 0 ng/mL (ref 0.00–0.08)

## 2016-10-07 LAB — COMPREHENSIVE METABOLIC PANEL
ALBUMIN: 4 g/dL (ref 3.5–5.0)
ALT: 16 U/L (ref 14–54)
AST: 23 U/L (ref 15–41)
Alkaline Phosphatase: 52 U/L (ref 38–126)
Anion gap: 11 (ref 5–15)
BILIRUBIN TOTAL: 0.7 mg/dL (ref 0.3–1.2)
BUN: 18 mg/dL (ref 6–20)
CHLORIDE: 102 mmol/L (ref 101–111)
CO2: 23 mmol/L (ref 22–32)
CREATININE: 1.08 mg/dL — AB (ref 0.44–1.00)
Calcium: 9.9 mg/dL (ref 8.9–10.3)
GFR calc Af Amer: 50 mL/min — ABNORMAL LOW (ref >60)
GFR calc non Af Amer: 44 mL/min — ABNORMAL LOW (ref >60)
GLUCOSE: 112 mg/dL — AB (ref 65–99)
POTASSIUM: 3.9 mmol/L (ref 3.5–5.1)
Sodium: 136 mmol/L (ref 135–145)
Total Protein: 7.5 g/dL (ref 6.5–8.1)

## 2016-10-07 LAB — DIFFERENTIAL
BASOS ABS: 0 10*3/uL (ref 0.0–0.1)
BASOS PCT: 0 %
Eosinophils Absolute: 0.1 10*3/uL (ref 0.0–0.7)
Eosinophils Relative: 1 %
LYMPHS ABS: 1.6 10*3/uL (ref 0.7–4.0)
Lymphocytes Relative: 14 %
Monocytes Absolute: 0.7 10*3/uL (ref 0.1–1.0)
Monocytes Relative: 7 %
NEUTROS ABS: 8.9 10*3/uL — AB (ref 1.7–7.7)
NEUTROS PCT: 78 %

## 2016-10-07 LAB — PROTIME-INR
INR: 0.97
Prothrombin Time: 12.9 seconds (ref 11.4–15.2)

## 2016-10-07 LAB — CBC
HEMATOCRIT: 40.3 % (ref 36.0–46.0)
HEMOGLOBIN: 13.2 g/dL (ref 12.0–15.0)
MCH: 30.9 pg (ref 26.0–34.0)
MCHC: 32.8 g/dL (ref 30.0–36.0)
MCV: 94.4 fL (ref 78.0–100.0)
Platelets: 243 10*3/uL (ref 150–400)
RBC: 4.27 MIL/uL (ref 3.87–5.11)
RDW: 13.8 % (ref 11.5–15.5)
WBC: 11.3 10*3/uL — AB (ref 4.0–10.5)

## 2016-10-07 LAB — I-STAT CHEM 8, ED
BUN: 21 mg/dL — AB (ref 6–20)
CREATININE: 1 mg/dL (ref 0.44–1.00)
Calcium, Ion: 1.18 mmol/L (ref 1.15–1.40)
Chloride: 103 mmol/L (ref 101–111)
Glucose, Bld: 106 mg/dL — ABNORMAL HIGH (ref 65–99)
HEMATOCRIT: 41 % (ref 36.0–46.0)
Hemoglobin: 13.9 g/dL (ref 12.0–15.0)
POTASSIUM: 3.9 mmol/L (ref 3.5–5.1)
Sodium: 139 mmol/L (ref 135–145)
TCO2: 24 mmol/L (ref 0–100)

## 2016-10-07 LAB — APTT: APTT: 26 s (ref 24–36)

## 2016-10-07 NOTE — ED Notes (Signed)
Patient able to ambulate independently  

## 2016-10-07 NOTE — ED Provider Notes (Signed)
PT CARE CONTINUED FROM NICOLE PISCIOTTA, PA-C.  Patient's visual acuity is 20/25 in the right and left eye.  Recheck. There no new gross neurologic deficits appreciated.  MR of the orbits reveals no acute findings or specific explanation for symptoms. There is some generalized atrophy present. MR of the reveals no acute findings or specific explanation for symptoms. There is some capillary telangiectasia in the right pons. There is no evidence for any optic neuritis and no inflammatory changes or mass appreciated.  Case discussed with [facility] - ophthalmology. He will see the patient in the office tomorrow morning at 10:30. I discussed the findings with the patient and her family in terms which they understand. Answered questions. Patient and the family in agreement with the discharge plan. The patient will return to the emergency department sooner if any vision changes, dizziness, unusual headache, or changes in general condition.   Lily Kocher, PA-C 10/07/16 1855    Drenda Freeze, MD 10/07/16 [phone]

## 2016-10-07 NOTE — Discharge Instructions (Signed)
Your CT scan is negative for acute problem. The MR of your brain is negative for acute problem. The MR of your orbits also negative for acute problem. Your blood pressure is slightly elevated at 182/66, otherwise your vital signs are within normal limits.  Please see Dr.Bevis at his office tomorrow morning. His office is at [place]. he is in [place]. Please call at   [phone].  Please return to the Emergency Dept if any unusual headache, dizziness, weakness,  or vision changes.

## 2016-10-07 NOTE — ED Triage Notes (Signed)
Pt reports blurry vision today at 9am with left eye pain "I saw zig zag lights especially in the left eye." This has went away and lasted about 30 minutes. She also reports feeling dizzy. She reports she fell 9 days ago and hit her her head. A&OX4. No blood thinners.

## 2016-10-07 NOTE — ED Notes (Signed)
This RN called Ct to check on when patient could be transported to CT and was told someone would be on the way to take pt to CT

## 2016-10-07 NOTE — ED Provider Notes (Signed)
[facility] DEPT Provider Note   CSN: [id number] Arrival date & time: 10/07/16  1231     History   Chief Complaint Chief Complaint  Patient presents with  . Blurred Vision    HPI  Blood pressure (!) 211/85, pulse 74, temperature 98.1 F (36.7 C), temperature source Oral, SpO2 100 %.  [name] is a 83 y.o. female with past medical history significant for non-insulin-dependent diabetes, hypertension, hyperlipidemia and orthostatic hypotension complaining of vision out of the left eye upon waking yesterday when she was doing the crossword puzzle, this lasted for approximately 30 minutes she'll die starting this morning. This also has resolved. Of note, this patient has dry eyes and reports improvement of the discomfort with eyedrops. She does not report any dry eyes or irritation with the associated change in her vision. She denies any dysarthria or difficulty walking. She does have the associated symptoms of generalized weakness and feeling dizzy (generally weak) she's been eating and drinking normally with normal almond movement (she denies melena or hematochezia). She does not wear contact lenses but does wear corrective glasses. Patient had a fall with head trauma last week. She's not anticoagulated.  Past Medical History:  Diagnosis Date  . Acute pharyngitis   . Acute sinusitis, unspecified   . Acute upper respiratory infections of unspecified site   . Allergy   . Anal and rectal polyp   . Cataract    History of  . Cavernous angioma   . Degenerative cervical disc 02/2006  . Diabetes mellitus    Type II  . GERD (gastroesophageal reflux disease)   . Gout   . Hemangioma of unspecified site   . Hyperlipidemia   . Hypertension   . Orthostatic hypotension 04/2006   Hospital  . Osteoarthritis   . Osteopenia   . Other abscess of vulva   . Palpitations   . Syncope and collapse    1 episode    Patient Active Problem List   Diagnosis Date Noted  . Left carpal  tunnel syndrome 08/11/2016  . Acute epigastric pain 06/15/2016  . Fatigue 06/15/2016  . Acute diarrhea 05/12/2016  . Impacted cerumen of left ear 04/06/2016  . Constipation 03/21/2016  . General weakness 01/11/2016  . Near syncope 05/07/2015  . Osteoarthritis of right shoulder 05/04/2015  . Counseling regarding end of life decision making 01/15/2015  . Skin lesion of right leg 07/10/2014  . Insomnia 04/10/2014  . Osteoarthritis of right knee 10/14/2013  . Osteoarthritis of left knee 10/14/2013  . Gout, chronic, without tophus 12/13/2009  . Osteoporosis 10/29/2009  . PALPITATIONS, OCCASIONAL 07/07/2008  . Allergic rhinitis 06/02/2008  . HEMANGIOMA 07/30/2006  . Diabetes mellitus type 2, controlled, without complications ([facility]) 16/60/6004  . Hyperlipidemia LDL goal <100 07/30/2006  . Essential hypertension, benign 07/30/2006  . GERD 07/30/2006  . RECTAL POLYPS 07/30/2006  . OSTEOARTHRITIS 07/30/2006  . CATARACT, HX OF 07/30/2006    Past Surgical History:  Procedure Laterality Date  . ABDOMINAL HYSTERECTOMY    . Aortic dopplers     Mild plaque.  EEG okay  . CATARACT EXTRACTION    . Cavernous angioma    . Cavernous hemangioma  04/2004   Hospital  . TOTAL KNEE ARTHROPLASTY     Right    OB History    No data available       Home Medications    Prior to Admission medications   Medication Sig Start Date End Date Taking? Authorizing Provider  ACCU-CHEK AVIVA PLUS  test strip USE TO CHECK BLOOD SUGAR DAILY AS NEEDED. DX: E11.9 10/24/15   Bedsole, Amy E, MD  aspirin EC 81 MG tablet Take 40.5 mg by mouth at bedtime.     [provider]  Blood Glucose Monitoring Suppl (ACCU-CHEK AVIVA PLUS) W/DEVICE KIT Use to check blood sugar daily as needed.  Dx: E11.9 09/08/14   [name], MD  Calcium Citrate (CITRACAL PO) Take 1 capsule by mouth daily.     [provider]  fish oil-omega-3 fatty acids 1000 MG capsule Take 1 g by mouth daily.      [provider]  fluticasone (FLONASE) 50 MCG/ACT nasal spray PLACE 2 SPRAYS INTO BOTH NOSTRILS DAILY. 06/08/16   Bedsole, Amy E, MD  Lancets (FREESTYLE) lancets CHECK BLOOD SUGAR 1-2 TIMES A DAY. DX: E11.9 NON INSULIN DEPENDENT 10/24/15   Diona Browner, Amy E, MD  metFORMIN (GLUCOPHAGE) 500 MG tablet Take 1 tablet (500 mg total) by mouth daily with breakfast. Patient not taking: Reported on 09/28/2016 06/15/16   [name], MD  Multiple Vitamin (MULTIVITAMIN) tablet Take 1 tablet by mouth.    [provider]  omeprazole (PRILOSEC) 40 MG capsule Take 1 capsule (40 mg total) by mouth daily. 08/08/16   Bedsole, Mervyn Gay, MD  Polyethyl Glycol-Propyl Glycol (SYSTANE OP) Apply 1 drop to eye daily as needed (for dry eyes).    [provider]  ramipril (ALTACE) 5 MG capsule TAKE 1 CAPSULE (5 MG TOTAL) BY MOUTH DAILY. 04/24/16   [name], MD    Family History Family History  Problem Relation Age of Onset  . Heart disease Other        CHF  . Cancer Maternal Aunt        Breast CA    Social History Social History  Substance Use Topics  . Smoking status: Former Research scientist (life sciences)  . Smokeless tobacco: Never Used     Comment: quit 1970  . Alcohol use No     Allergies   Atorvastatin; Codeine; and Rosuvastatin   Review of Systems Review of Systems  A complete review of systems was obtained and all systems are negative except as noted in the HPI and PMH.   Physical Exam Updated Vital Signs BP (!) 182/66   Pulse 68   Temp 98.1 F (36.7 C) (Oral)   Resp 18   SpO2 99%   Physical Exam  Constitutional: She is oriented to person, place, and time. She appears well-developed and well-nourished. No distress.  HENT:  Head: Normocephalic and atraumatic.  Mouth/Throat: Oropharynx is clear and moist.  Eyes: Conjunctivae and EOM are normal. Pupils are equal, round, and reactive to light.  No TTP of maxillary or frontal sinuses  No TTP or induration of temporal arteries bilaterally  Neck: Normal range of  motion. Neck supple.  FROM to C-spine. Pt can touch chin to chest without discomfort. No TTP of midline cervical spine.   Cardiovascular: Normal rate, regular rhythm and intact distal pulses.   Pulmonary/Chest: Effort normal and breath sounds normal. No respiratory distress. She has no wheezes. She has no rales. She exhibits no tenderness.  Abdominal: Soft. Bowel sounds are normal. There is no tenderness.  Musculoskeletal: Normal range of motion. She exhibits no edema or tenderness.  Neurological: She is alert and oriented to person, place, and time. No cranial nerve deficit.  II-Visual fields grossly intact. III/IV/VI-Extraocular movements intact.  Pupils reactive bilaterally. V/VII-Smile symmetric, equal eyebrow raise,  facial sensation intact VIII-  Hearing grossly intact IX/X-Normal gag XI-bilateral shoulder shrug XII-midline tongue extension Motor: 5/5 bilaterally with normal tone and bulk Cerebellar: Normal finger-to-nose     Skin: She is not diaphoretic.  Psychiatric: She has a normal mood and affect.  Nursing note and vitals reviewed.    ED Treatments / Results  Labs (all labs ordered are listed, but only abnormal results are displayed) Labs Reviewed  CBC - Abnormal; Notable for the following:       Result Value   WBC 11.3 (*)    All other components within normal limits  DIFFERENTIAL - Abnormal; Notable for the following:    Neutro Abs 8.9 (*)    All other components within normal limits  COMPREHENSIVE METABOLIC PANEL - Abnormal; Notable for the following:    Glucose, Bld 112 (*)    Creatinine, Ser 1.08 (*)    GFR calc non Af Amer 44 (*)    GFR calc Af Amer 50 (*)    All other components within normal limits  I-STAT CHEM 8, ED - Abnormal; Notable for the following:    BUN 21 (*)    Glucose, Bld 106 (*)    All other components within normal limits  PROTIME-INR  APTT  I-STAT TROPOININ, ED    EKG  EKG Interpretation None       Radiology Ct Head Wo  Contrast  Result Date: 10/07/2016 CLINICAL DATA:  Fall 10 days ago. EXAM: CT HEAD WITHOUT CONTRAST TECHNIQUE: Contiguous axial images were obtained from the base of the skull through the vertex without intravenous contrast. COMPARISON:  11/21/13 FINDINGS: Brain: Low attenuation within the subcortical and periventricular white matter noted compatible with chronic small vessel ischemic change. Prominence of the sulci and ventricles are identified compatible with brain atrophy. No evidence of acute infarction, hemorrhage, hydrocephalus, extra-axial collection or mass lesion/ mass effect. Chronic calcification within the brainstem is again noted. Vascular: No hyperdense vessel or unexpected calcification. Skull: Normal. Negative for fracture or focal lesion. Sinuses/Orbits: The mastoid air cells and paranasal sinuses are clear. Other: None. IMPRESSION: 1. No acute intracranial abnormalities. 2. Chronic small vessel ischemic change and brain atrophy. Electronically Signed   By: Kerby Moors M.D.   On: 10/07/2016 14:09    Procedures Procedures (including critical care time)  Medications Ordered in ED Medications - No data to display   Initial Impression / Assessment and Plan / ED Course  I have reviewed the triage vital signs and the nursing notes.  Pertinent labs & imaging results that were available during my care of the patient were reviewed by me and considered in my medical decision making (see chart for details).     Vitals:   10/07/16 1246 10/07/16 1441  BP: (!) 211/85 (!) 182/66  Pulse: 74 68  Resp:  18  Temp: 98.1 F (36.7 C)   TempSrc: Oral   SpO2: 100% 99%    Medications - No data to display  [name] is 84 y.o. female presenting with 2 episodes of resolved change in vision. First episode was yesterday morning she describes a blurred vision which lasted for 30 minutes, the second episode was this a.m. she describes a zigzag in her vision out of the left eye. With these  are fully resolved. The only associated symptoms is a generalized weakness. No other neurologic complaints. Neurologic exam is nonfocal. Head CT negative. Patient is pending MRI of brain and orbits. Case signed out to PA Landfall at shift change: Plan is to follow-up MRI  and consult ophthalmology if there is no central cause of her vision changes.  This is a shared visit with the attending physician who personally evaluated the patient and agrees with the care plan.    Final Clinical Impressions(s) / ED Diagnoses   Final diagnoses:  Vision blurred    New Prescriptions New Prescriptions   No medications on file     Waynetta Pean 10/07/16 1543    Julianne Rice, MD 10/08/16 [phone]

## 2016-10-07 NOTE — ED Notes (Signed)
Dr. Jeanell Sparrow informed of patient complaints. No code stroke activation but would like for patient to have a head CT and to "order a code stroke head ct"

## 2016-10-08 DIAGNOSIS — Z961 Presence of intraocular lens: Secondary | ICD-10-CM | POA: Diagnosis not present

## 2016-10-08 DIAGNOSIS — I10 Essential (primary) hypertension: Secondary | ICD-10-CM | POA: Diagnosis not present

## 2016-10-08 DIAGNOSIS — H43813 Vitreous degeneration, bilateral: Secondary | ICD-10-CM | POA: Diagnosis not present

## 2016-10-08 DIAGNOSIS — E119 Type 2 diabetes mellitus without complications: Secondary | ICD-10-CM | POA: Diagnosis not present

## 2016-10-09 ENCOUNTER — Telehealth: Payer: Self-pay

## 2016-10-09 DIAGNOSIS — H538 Other visual disturbances: Secondary | ICD-10-CM

## 2016-10-09 NOTE — Telephone Encounter (Signed)
Per chart review tab pt seen [facility] on 10/07/16. Per AVS from ED note pt was to see opthalmology.

## 2016-10-09 NOTE — Telephone Encounter (Signed)
Pt left v/m that she remembered that the opthalmologist is the provider that recommended the carotid to be checked.

## 2016-10-09 NOTE — Telephone Encounter (Signed)
PLEASE NOTE: All timestamps contained within this report are represented as [place] Standard Time. CONFIDENTIALTY NOTICE: This fax transmission is intended only for the addressee. It contains information that is legally privileged, confidential or otherwise protected from use or disclosure. If you are not the intended recipient, you are strictly prohibited from reviewing, disclosing, copying using or disseminating any of this information or taking any action in reliance on or regarding this information. If you have received this fax in error, please notify us immediately by telephone so that we can arrange for its return to [place]. Phone: [phone], Toll-Free: [phone], Fax: [phone] Page: 1 of 1 Call Id: [id number] [place] Patient Name: [name] Gender: Female DOB: [birth year] Age: 83 Y 74 M 30 D Return Phone Number: [phone] (Primary) City/State/Zip: [place] [address] Client [place] Night - Client Client Site [place] Physician Eliezer Lofts - MD Who Is Calling Patient / Member / Family / Caregiver Call Type Triage / Clinical Relationship To Patient Self Return Phone Number [phone] (Primary) Chief Complaint Vision - (non urgent symptoms) Reason for Call Symptomatic / Request for [place] states left eye is bothering her. Can see a little light that zig zags on the left side of her eye. Was seeing double yesterday. Nurse Assessment Nurse: Emilio Math, RN, Estill Bamberg Date/Time (Eastern Time): 10/07/2016 10:01:08 AM Confirm and document reason for call. If symptomatic, describe symptoms. ---Caller states she was having double vision yesterday. Today, sees "zig-zag" light in L eye. Fell x1 week ago and hit head. Was evaluated by MD for fall. Had molar removed Wednesday. Does the PT have any  chronic conditions? (i.e. diabetes, asthma, etc.) ---Yes List chronic conditions. ---Diabetes, HTN Guidelines Guideline Title Affirmed Question Vision Loss or Change Flashes of light (Exception: brief from pressing on the eyeball) Disp. Time [name] Time) Disposition Final User 10/07/2016 10:07:14 AM See Physician within 4 Hours (or PCP triage) Yes Emilio Math, RN, Estill Bamberg Referrals GO TO FACILITY UNDECIDED Care Advice Given Per Guideline SEE PHYSICIAN WITHIN 4 HOURS (or PCP triage): * IF OFFICE WILL BE CLOSED AND NO PCP TRIAGE: You need to be seen within the next 3 or 4 hours. A nearby Urgent Care Center is often a good source of care. Another choice is to go to the ER. Go sooner if you become worse. DRIVING: Another adult should drive. CALL BACK IF: * Blurred vision becomes worse. CARE ADVICE given per Vision Loss or Change (Adult) guideline.

## 2016-10-09 NOTE — Addendum Note (Signed)
Addended by: Owens Loffler on: 10/09/2016 01:31 PM   Modules accepted: Orders

## 2016-10-09 NOTE — Telephone Encounter (Signed)
Call patient - she missed her opthalmology f/u???

## 2016-10-09 NOTE — Telephone Encounter (Signed)
[name] notified that the carotid dopplers have been order and [name] will be calling her with that appointment once it has been scheduled.

## 2016-10-09 NOTE — Telephone Encounter (Addendum)
Pt left v/m; pt was seen at [facility] ED on 10/07/16; pt was to call Dr Diona Browner to let her know ED recommended pt have carotid artery checked as soon as possible. Pt request cb when Dr Diona Browner orders test.

## 2016-10-09 NOTE — Telephone Encounter (Signed)
ordered

## 2016-10-09 NOTE — Telephone Encounter (Signed)
This is a [facility] call from the weekend.  She was seen in ED over the weekend and is to seen by opthalmology today.  This is just a FYI.

## 2016-10-11 ENCOUNTER — Telehealth: Payer: Self-pay | Admitting: Family Medicine

## 2016-10-11 NOTE — Telephone Encounter (Signed)
Pt called back, she is returning a call regarding prolia Pt request cb  [phone] Thanks

## 2016-10-11 NOTE — Telephone Encounter (Signed)
In reviewing the prolia book, this pt never scheduled her prolia injection. LM on pts vm requesting a call back to schedule.

## 2016-10-12 ENCOUNTER — Ambulatory Visit (HOSPITAL_COMMUNITY)
Admission: RE | Admit: 2016-10-12 | Discharge: 2016-10-12 | Disposition: A | Discharge status: Home / Self Care | Payer: Medicare Other | Source: Ambulatory Visit | Attending: Cardiology | Admitting: Cardiology

## 2016-10-12 DIAGNOSIS — I10 Essential (primary) hypertension: Secondary | ICD-10-CM | POA: Insufficient documentation

## 2016-10-12 DIAGNOSIS — I6523 Occlusion and stenosis of bilateral carotid arteries: Secondary | ICD-10-CM | POA: Insufficient documentation

## 2016-10-12 DIAGNOSIS — H538 Other visual disturbances: Secondary | ICD-10-CM | POA: Insufficient documentation

## 2016-10-12 DIAGNOSIS — E119 Type 2 diabetes mellitus without complications: Secondary | ICD-10-CM | POA: Diagnosis not present

## 2016-10-12 DIAGNOSIS — E785 Hyperlipidemia, unspecified: Secondary | ICD-10-CM | POA: Insufficient documentation

## 2016-10-12 NOTE — Telephone Encounter (Signed)
Spoke with [name]. She has had a lot going on.  She fell and hit her head.  Then she had to have her tooth pulled and today she is doing her carotid test. .  She is scheduled to see Dr. Diona Browner in late July.  We will plan on doing Prolia at that office visit.

## 2016-10-12 NOTE — Telephone Encounter (Signed)
Thanks. I will add a reminder to the prolia book to check back after pts appt

## 2016-10-12 NOTE — Telephone Encounter (Signed)
See additional phone note. 

## 2016-10-24 ENCOUNTER — Other Ambulatory Visit: Payer: Self-pay | Admitting: Family Medicine

## 2016-10-31 ENCOUNTER — Encounter: Payer: Self-pay | Admitting: Podiatry

## 2016-10-31 ENCOUNTER — Ambulatory Visit (INDEPENDENT_AMBULATORY_CARE_PROVIDER_SITE_OTHER): Payer: Medicare Other | Admitting: Podiatry

## 2016-10-31 DIAGNOSIS — M79676 Pain in unspecified toe(s): Secondary | ICD-10-CM

## 2016-10-31 DIAGNOSIS — B351 Tinea unguium: Secondary | ICD-10-CM | POA: Diagnosis not present

## 2016-10-31 DIAGNOSIS — E119 Type 2 diabetes mellitus without complications: Secondary | ICD-10-CM

## 2016-10-31 NOTE — Patient Instructions (Signed)

## 2016-10-31 NOTE — Progress Notes (Signed)
Patient ID: [name], female   DOB: 07-02-1922, 83 y.o.   MRN: [id number]    Subjective: This patient presents today complaining of uncomfortable toenails walking wearing shoes and requests toenail debridement. Patient is known diabetic and does denies any history of amputation, claudication or amputation  Objective: Orientated 3 DP and PT pulses 2/4 bilaterally Capillary reflex immediate bilaterally Sensation to 10 g monofilament wire intact 4/5 right and 5/5 left Vibratory sensation reactive bilaterally Ankle reflex equal and reactive bilaterally No open skin lesions bilaterally Atrophic skin, without any hair growth bilaterally The toenails are elongated, discolored, hypertrophic and tender to direct palpation 6-10 HAV bilaterally Hammertoe second bilaterally  Assessment: Diabetic with satisfactory neurovascular status Symptomatic onychomycoses 6-10  Plan: Debridement toenails 6-10 mechanically and electrically. Without any bleeding  Reappoint 3 months

## 2016-11-01 ENCOUNTER — Ambulatory Visit (INDEPENDENT_AMBULATORY_CARE_PROVIDER_SITE_OTHER): Payer: Medicare Other | Admitting: Primary Care

## 2016-11-01 ENCOUNTER — Encounter: Payer: Self-pay | Admitting: Primary Care

## 2016-11-01 DIAGNOSIS — I639 Cerebral infarction, unspecified: Secondary | ICD-10-CM | POA: Diagnosis not present

## 2016-11-01 DIAGNOSIS — J3089 Other allergic rhinitis: Secondary | ICD-10-CM

## 2016-11-01 NOTE — Assessment & Plan Note (Signed)
Symptoms today appear to be allergy related. Will have her start daily Claritin, continue Flonase. She does not appear sickly, no evidence of sinusitis or other bacterial involvement.

## 2016-11-01 NOTE — Patient Instructions (Signed)
You do not have an infection.   Start loratadine (Claritin) 10 mg tablets for drainage and congestion. Continue using Flonase.  Remember to wash your hands when out in public places.  It was a pleasure to see you today!

## 2016-11-01 NOTE — Progress Notes (Signed)
Subjective:    Patient ID: [name], female    DOB: 15-Sep-1922, 83 y.o.   MRN: [id number]  HPI  [name] is a 83 year old female with a history of chronic allergic rhinitis, diabetes who presents today with a chief complaint of nasal congestion. She also reports sore throat, fatigue, post nasal drip. Two to three days ago she noticed increased nasal congestion. She's been using Flonase and wetting drops to her eyes. She denies fevers, cough. She will be visiting her grandson and is afraid she will "give him something".   She also fell and hit her head three weeks ago. She was due to have her left lower molar pulled by her dentist who refused until evaluated given her fall. She underwent evaluation for the fall and was cleared to have her tooth pulled. She had her tooth pulled 2-3 weeks ago. She denies dental pain.   Review of Systems  Constitutional: Negative for fever.  HENT: Positive for congestion and sinus pressure. Negative for ear pain and sore throat.   Respiratory: Negative for cough, shortness of breath and wheezing.        Past Medical History:  Diagnosis Date  . Acute pharyngitis   . Acute sinusitis, unspecified   . Acute upper respiratory infections of unspecified site   . Allergy   . Anal and rectal polyp   . Cataract    History of  . Cavernous angioma   . Degenerative cervical disc 02/2006  . Diabetes mellitus    Type II  . GERD (gastroesophageal reflux disease)   . Gout   . Hemangioma of unspecified site   . Hyperlipidemia   . Hypertension   . Orthostatic hypotension 04/2006   Hospital  . Osteoarthritis   . Osteopenia   . Other abscess of vulva   . Palpitations   . Syncope and collapse    1 episode     Social History   Social History  . Marital status: Married    Spouse name: N/A  . Number of children: 3  . Years of education: N/A   Occupational History  . 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 Smoking status: Former Research scientist (life sciences)  . Smokeless tobacco: Never Used     Comment: quit 1970  . Alcohol use No  . Drug use: No  . Sexual activity: Not on file   Other Topics Concern  . Not on file   Social History Narrative   Diet: fruit and veggies, water, lean protein.   Activity limited secondary to knee discomfort/arthritis, but going to church exersice group two times a week.                 Past Surgical History:  Procedure Laterality Date  . ABDOMINAL HYSTERECTOMY    . Aortic dopplers     Mild plaque.  EEG okay  . CATARACT EXTRACTION    . Cavernous angioma    . Cavernous hemangioma  04/2004   Hospital  . TOTAL KNEE ARTHROPLASTY     Right    Family History  Problem Relation Age of Onset  . Heart disease Other        CHF  . Cancer Maternal Aunt        Breast CA    Allergies  Allergen Reactions  . Atorvastatin     REACTION: intolerant - pt does not remember reaction  . Codeine  REACTION: GI upset  . Rosuvastatin     REACTION: intolerant pt does not remember reaction    Current Outpatient Prescriptions on File Prior to Visit  Medication Sig Dispense Refill  . ACCU-CHEK AVIVA PLUS test strip USE TO CHECK BLOOD SUGAR DAILY AS NEEDED. DX: E11.9 100 each 3  . acetaminophen (TYLENOL) 325 MG tablet Take 650 mg by mouth 2 (two) times daily.    Marland Kitchen aspirin EC 81 MG tablet Take 40.5 mg by mouth at bedtime.     . Blood Glucose Monitoring Suppl (ACCU-CHEK AVIVA PLUS) W/DEVICE KIT Use to check blood sugar daily as needed.  Dx: E11.9 1 kit 0  . Calcium Carbonate-Vitamin D (CALCIUM-D PO) Take 1 tablet by mouth daily.    . fish oil-omega-3 fatty acids 1000 MG capsule Take 1 g by mouth daily.      . fluticasone (FLONASE) 50 MCG/ACT nasal spray PLACE 2 SPRAYS INTO BOTH NOSTRILS DAILY. 16 g 3  . Lancets (FREESTYLE) lancets CHECK BLOOD SUGAR 1-2 TIMES A DAY. DX: E11.9 NON INSULIN DEPENDENT 100 each 3  . metFORMIN (GLUCOPHAGE) 500 MG tablet Take 1 tablet (500 mg  total) by mouth daily with breakfast. 90 tablet 3  . Multiple Vitamin (MULTIVITAMIN WITH MINERALS) TABS tablet Take 1 tablet by mouth daily.    Marland Kitchen omeprazole (PRILOSEC) 40 MG capsule Take 1 capsule (40 mg total) by mouth daily. 90 capsule 3  . Polyethyl Glycol-Propyl Glycol (SYSTANE OP) Place 1 drop into both eyes daily as needed (for dry eyes).     . ramipril (ALTACE) 5 MG capsule TAKE 1 CAPSULE (5 MG TOTAL) BY MOUTH DAILY. 90 capsule 1   Current Facility-Administered Medications on File Prior to Visit  Medication Dose Route Frequency Provider Last Rate Last Dose  . denosumab (PROLIA) injection 60 mg  60 mg Subcutaneous Q6 months Bedsole, Amy E, MD   60 mg at 03/14/16 1431    BP 118/70   Pulse 81   Temp 98.2 F (36.8 C) (Oral)   Ht _0  (1.6 m)   Wt 123 lb 1.9 oz (55.8 kg)   SpO2 97%   BMI 21.81 kg/m    Objective:   Physical Exam  Constitutional: She appears well-nourished. She does not appear ill.  HENT:  Right Ear: Tympanic membrane and ear canal normal.  Left Ear: Tympanic membrane and ear canal normal.  Nose: Mucosal edema present. Right sinus exhibits no maxillary sinus tenderness and no frontal sinus tenderness. Left sinus exhibits no maxillary sinus tenderness and no frontal sinus tenderness.  Mouth/Throat: Oropharynx is clear and moist.  Eyes: Conjunctivae are normal.  Neck: Neck supple.  Cardiovascular: Normal rate and regular rhythm.   Pulmonary/Chest: Effort normal and breath sounds normal. She has no wheezes. She has no rales.  Lymphadenopathy:    She has no cervical adenopathy.  Skin: Skin is warm and dry.          Assessment & Plan:

## 2016-11-02 ENCOUNTER — Telehealth: Payer: Self-pay | Admitting: Family Medicine

## 2016-11-02 NOTE — Telephone Encounter (Signed)
Please advise, thanks.

## 2016-11-02 NOTE — Telephone Encounter (Signed)
Doubt caused nasal bleeding unless caused very dry nose.   Stop instead any  nasal sprays.  Continue claritin.

## 2016-11-02 NOTE — Telephone Encounter (Signed)
Ms. Keir notified as instructed by telephone.   °

## 2016-11-02 NOTE — Telephone Encounter (Signed)
[facility] Patient Name: [name] DOB: 12/08/1922 Initial Comment Caller says , was seen yesterday for allergy Sx. Was advised to try an OTC allergy medication like Clariten. This morning she had some blood when blowing her nose. The pharm says it could be a side effect. Nurse Assessment Nurse: Markus Daft, RN, Sherre Poot Date/Time (Eastern Time): 11/02/2016 10:02:58 AM Confirm and document reason for call. If symptomatic, describe symptoms. ---Caller states that she was seen yesterday for seasonal allergies. C/O stuffy nose, and advised to take Claritin. This morning she had some blood when blowing her nose. The pharmacy says it could be a side effect. She usually uses Flonase. Not using humidifier. - RN advised that it could be from blowing nose too hard or from stuffy nose. RN did not note this as a s.e. from Claritin. Caller verbalized understanding. (per https:// [employer] details). Does the patient have any new or worsening symptoms? ---Yes Will a triage be completed? ---Yes Related visit to physician within the last 2 weeks? ---Yes Does the PT have any chronic conditions? (i.e. diabetes, asthma, etc.) ---Yes List chronic conditions. ---seasonal allergies, diabetic Is this a behavioral health or substance abuse call? ---No Guidelines Guideline Title Affirmed Question Affirmed Notes Nosebleed [1] Mild-moderate nosebleed AND [2] bleeding stopped now Final Disposition Colby, RN, Sherre Poot Comments Caller wants to know if MD wants her to stop the Claritin? Please call her. She will wait to take again til she hears something. Disagree/Comply: Comply

## 2016-11-03 NOTE — Telephone Encounter (Signed)
Okay to stop medication. Please get details of symptoms and how severe nosebleeds have been.

## 2016-11-03 NOTE — Telephone Encounter (Signed)
Ms. Heffernan notified as instructed by telephone.   °

## 2016-11-03 NOTE — Telephone Encounter (Signed)
Pt left v/m; pt was seen 11/01/16; pt had a nose bleed in the morning and also another nose bleed 11/02/16 in the afternoon. Pt has not taken anymore of med given on 11/01/16. Pt request cb.

## 2016-11-03 NOTE — Telephone Encounter (Signed)
Spoke with [name].  Advised her it is okay to stop the claritin.  She states she has pressure in her eyes that make her feel like she needs to close them.  She states her nose is stuffy.  Denies runny nose, sneezing or itchy, watery eyes.  She states her throat hurts slightly but does have thick stuff that comes down from her sinuses on occasion. She states the first time she had the nose bleed, she woke up and felt like there was something in her nose that needed to come out so she blew her nose and blood came out.  She blew it a second time and clotted looking blood came out.  Last night she had her head bent down reading a book and blood started trickling out of her nose.  She is concerned because she is going to see her son at the end of the month and wants to make sure she isn't going to give him something.

## 2016-11-03 NOTE — Telephone Encounter (Signed)
Try not to blow nose given scab might be disrupted.   Sounds more allergic or viral.. No clear bacterial infeciton, but if not improving next week follow up.   If heavy nose bleed unable to stop with pressure > 5 min.. Go to ER.

## 2016-11-06 ENCOUNTER — Telehealth: Payer: Self-pay | Admitting: Family Medicine

## 2016-11-06 DIAGNOSIS — H35033 Hypertensive retinopathy, bilateral: Secondary | ICD-10-CM | POA: Diagnosis not present

## 2016-11-06 DIAGNOSIS — E785 Hyperlipidemia, unspecified: Secondary | ICD-10-CM

## 2016-11-06 DIAGNOSIS — H04123 Dry eye syndrome of bilateral lacrimal glands: Secondary | ICD-10-CM | POA: Diagnosis not present

## 2016-11-06 DIAGNOSIS — M1A9XX0 Chronic gout, unspecified, without tophus (tophi): Secondary | ICD-10-CM

## 2016-11-06 DIAGNOSIS — H43393 Other vitreous opacities, bilateral: Secondary | ICD-10-CM | POA: Diagnosis not present

## 2016-11-06 DIAGNOSIS — E119 Type 2 diabetes mellitus without complications: Secondary | ICD-10-CM | POA: Diagnosis not present

## 2016-11-06 LAB — HM DIABETES EYE EXAM

## 2016-11-06 NOTE — Telephone Encounter (Signed)
-----   Message from Ellamae Sia sent at 11/02/2016  2:31 PM EDT ----- Regarding: Lab orders for Tuesday, 7.17.18 Lab orders for a 3 month follow up appt.

## 2016-11-07 ENCOUNTER — Other Ambulatory Visit (INDEPENDENT_AMBULATORY_CARE_PROVIDER_SITE_OTHER): Payer: Medicare Other

## 2016-11-07 DIAGNOSIS — M1A9XX0 Chronic gout, unspecified, without tophus (tophi): Secondary | ICD-10-CM | POA: Diagnosis not present

## 2016-11-07 DIAGNOSIS — E119 Type 2 diabetes mellitus without complications: Secondary | ICD-10-CM

## 2016-11-07 LAB — COMPREHENSIVE METABOLIC PANEL
ALK PHOS: 50 U/L (ref 39–117)
ALT: 13 U/L (ref 0–35)
AST: 15 U/L (ref 0–37)
Albumin: 3.9 g/dL (ref 3.5–5.2)
BILIRUBIN TOTAL: 0.5 mg/dL (ref 0.2–1.2)
BUN: 27 mg/dL — AB (ref 6–23)
CO2: 29 meq/L (ref 19–32)
CREATININE: 1.09 mg/dL (ref 0.40–1.20)
Calcium: 9.9 mg/dL (ref 8.4–10.5)
Chloride: 106 mEq/L (ref 96–112)
GFR: 49.92 mL/min — AB (ref >60.00)
GLUCOSE: 101 mg/dL — AB (ref 70–99)
Potassium: 4.4 mEq/L (ref 3.5–5.1)
Sodium: 142 mEq/L (ref 135–145)
TOTAL PROTEIN: 6.9 g/dL (ref 6.0–8.3)

## 2016-11-07 LAB — HEMOGLOBIN A1C: HEMOGLOBIN A1C: 5.9 % (ref 4.6–6.5)

## 2016-11-07 LAB — URIC ACID: Uric Acid, Serum: 6 mg/dL (ref 2.4–7.0)

## 2016-11-10 ENCOUNTER — Ambulatory Visit: Payer: Medicare Other | Admitting: Family Medicine

## 2016-11-14 ENCOUNTER — Ambulatory Visit (INDEPENDENT_AMBULATORY_CARE_PROVIDER_SITE_OTHER): Payer: Medicare Other | Admitting: Family Medicine

## 2016-11-14 ENCOUNTER — Encounter: Payer: Self-pay | Admitting: Family Medicine

## 2016-11-14 VITALS — BP 131/74 | HR 74 | Temp 97.7°F | Ht 63.0 in | Wt 122.5 lb

## 2016-11-14 DIAGNOSIS — J3089 Other allergic rhinitis: Secondary | ICD-10-CM

## 2016-11-14 DIAGNOSIS — I639 Cerebral infarction, unspecified: Secondary | ICD-10-CM | POA: Diagnosis not present

## 2016-11-14 DIAGNOSIS — E119 Type 2 diabetes mellitus without complications: Secondary | ICD-10-CM

## 2016-11-14 DIAGNOSIS — G459 Transient cerebral ischemic attack, unspecified: Secondary | ICD-10-CM | POA: Diagnosis not present

## 2016-11-14 LAB — HM DIABETES FOOT EXAM

## 2016-11-14 NOTE — Assessment & Plan Note (Signed)
No sign of infection. Pt reassured. Likely allergies cause of congestion.. Treat with claritin.

## 2016-11-14 NOTE — Assessment & Plan Note (Signed)
Nml MRI brain and orbits.  No further episodes. Pt already on aspirin, cannot tolerate high dose. Fall risk.

## 2016-11-14 NOTE — Assessment & Plan Note (Signed)
Excellent control with diet.Marland Kitchen Despite being off medicaiton.

## 2016-11-14 NOTE — Progress Notes (Signed)
Subjective:    Patient ID: [name], female    DOB: 07/11/1922, 83 y.o.   MRN: [id number]  HPI   83 year old female presents for follow up DM  Diabetes:   Good control.. Now off all  DM meds. Lab Results  Component Value Date   HGBA1C 5.9 11/07/2016  Using medications without difficulties: Hypoglycemic episodes:none Hyperglycemic episodes: none Feet problems: no ulcer Blood Sugars averaging: 96-105 eye exam within last year: 11/22/2015   Head injury: 09/28/2016 OV with Dr. Carlisle Cater ER visit 6/16 vision changes lasted a few minutes, BP was 212/85  NML MRI brain Possible TIA  Seen by opthamologist Dr. Talbert Forest for vision changes: MR of the orbits reveals no acute findings or specific explanation for symptoms. There is some generalized atrophy present. MR of the reveals no acute findings or specific explanation for symptoms. There is some capillary telangiectasia in the right pons. There is no evidence for any optic neuritis and no inflammatory changes or mass appreciated.  No vision changes since.  Hypertension:   Was high in 09/2016 ER visit.. Now nml on ramipril  Low dose.  Using medication without problems or lightheadedness:  none Chest pain with exertion: none Edema:none Short of breath: none Average home BPs: not checking. Other issues: She is taking the aspirin.   Seeing Dr. Lynann Bologna for shoulder pain.  She does have some congestion.  She does not want to do prolia today.  Blood pressure 131/74, pulse 74, temperature 97.7 F (36.5 C), temperature source Oral, height 5\' 3"  (1.6 m), weight 122 lb 8 oz (55.6 kg).  Review of Systems  Constitutional: Negative for fatigue and fever.  HENT: Negative for ear pain.   Eyes: Negative for pain.  Respiratory: Negative for chest tightness and shortness of breath.   Cardiovascular: Negative for chest pain, palpitations and leg swelling.  Gastrointestinal: Negative for abdominal pain.  Genitourinary: Negative for dysuria.        Objective:   Physical Exam  Constitutional: Vital signs are normal. She appears well-developed and well-nourished. She is cooperative.  Non-toxic appearance. She does not appear ill. No distress.  HENT:  Head: Normocephalic.  Right Ear: Hearing, tympanic membrane, external ear and ear canal normal. Tympanic membrane is not erythematous, not retracted and not bulging.  Left Ear: Hearing, tympanic membrane, external ear and ear canal normal. Tympanic membrane is not erythematous, not retracted and not bulging.  Nose: No mucosal edema or rhinorrhea. Right sinus exhibits no maxillary sinus tenderness and no frontal sinus tenderness. Left sinus exhibits no maxillary sinus tenderness and no frontal sinus tenderness.  Mouth/Throat: Uvula is midline, oropharynx is clear and moist and mucous membranes are normal.  Eyes: Pupils are equal, round, and reactive to light. Conjunctivae, EOM and lids are normal. Lids are everted and swept, no foreign bodies found.  Neck: Trachea normal and normal range of motion. Neck supple. Carotid bruit is not present. No thyroid mass and no thyromegaly present.  Cardiovascular: Normal rate, regular rhythm, S1 normal, S2 normal, normal heart sounds, intact distal pulses and normal pulses.  Exam reveals no gallop and no friction rub.   No murmur heard. Pulmonary/Chest: Effort normal and breath sounds normal. No tachypnea. No respiratory distress. She has no decreased breath sounds. She has no wheezes. She has no rhonchi. She has no rales.  Abdominal: Soft. Normal appearance and bowel sounds are normal. There is no tenderness.  Neurological: She is alert.  Skin: Skin is warm, dry and intact. No  rash noted.  Psychiatric: Her speech is normal and behavior is normal. Judgment and thought content normal. Her mood appears not anxious. Cognition and memory are normal. She does not exhibit a depressed mood.      Diabetic foot exam: Normal inspection No skin breakdown No  calluses  Normal DP pulses Normal sensation to light touch and monofilament Nails normal     Assessment & Plan:

## 2016-11-14 NOTE — Patient Instructions (Signed)
Start loratidine daily. Stick with nasal saline for now.   Check blood pressure at home occasionally.  Call Dr. Lynann Bologna to discuss choulder pain.

## 2016-11-15 ENCOUNTER — Encounter: Payer: Self-pay | Admitting: Family Medicine

## 2016-11-17 ENCOUNTER — Telehealth: Payer: Self-pay

## 2016-11-17 NOTE — Telephone Encounter (Signed)
Left message for [name] that Dr. Diona Browner reviewed her BP reading and  we do not need to change her medication as this time. I advised her to  decrease frequency of checking her blood pressure to once every other day because it may be making her more anxious and increasing her readings further.  I ask that she call us if her BPs are consistently > 150/90.

## 2016-11-17 NOTE — Telephone Encounter (Signed)
Patient said she sat down and relaxed and her blood pressure went down to 135/71 and then she took her blood pressure 20 minutes after that and it was 124/78.

## 2016-11-17 NOTE — Telephone Encounter (Signed)
Pt called recent BP readings;(pt seen on 11/14/16) 11/16/16 in morning BP 174; 11/16/16 in afternoon BP 171. Today at 8:30 AM BP 149. Pt did not record the diastolic reading; pt checking BP now ; BP 170/95  P 86. Pt took ramipril this morning; No H/A,CP, SOB or dizziness. Pt does feel a little tired today.  Pt request cb after Dr Diona Browner reviews.

## 2016-11-17 NOTE — Telephone Encounter (Signed)
No med change recommended. Have pt decrease frequency of checking blood pressure.. It may be making her more anxious and increasing it further.  Check every other day... Call if  Consistently > 150/90

## 2016-11-22 ENCOUNTER — Encounter: Payer: Self-pay | Admitting: Family Medicine

## 2016-11-22 ENCOUNTER — Ambulatory Visit (INDEPENDENT_AMBULATORY_CARE_PROVIDER_SITE_OTHER): Payer: Medicare Other | Admitting: Family Medicine

## 2016-11-22 VITALS — BP 130/80 | HR 83 | Temp 98.2°F | Ht 63.0 in | Wt 121.0 lb

## 2016-11-22 DIAGNOSIS — I639 Cerebral infarction, unspecified: Secondary | ICD-10-CM

## 2016-11-22 DIAGNOSIS — J3089 Other allergic rhinitis: Secondary | ICD-10-CM

## 2016-11-22 NOTE — Progress Notes (Signed)
SUBJECTIVE:  [name] is a 83 y.o. female who complains of coryza, congestion and sneezing for 20 days. She denies a history of anorexia and chest pain and denies a history of asthma. Patient denies smoke cigarettes.   Current Outpatient Prescriptions on File Prior to Visit  Medication Sig Dispense Refill  . ACCU-CHEK AVIVA PLUS test strip USE TO CHECK BLOOD SUGAR DAILY AS NEEDED. DX: E11.9 100 each 3  . acetaminophen (TYLENOL) 325 MG tablet Take 650 mg by mouth 2 (two) times daily.    Marland Kitchen aspirin EC 81 MG tablet Take 40.5 mg by mouth at bedtime.     . Blood Glucose Monitoring Suppl (ACCU-CHEK AVIVA PLUS) W/DEVICE KIT Use to check blood sugar daily as needed.  Dx: E11.9 1 kit 0  . Calcium Carbonate-Vitamin D (CALCIUM-D PO) Take 1 tablet by mouth daily.    . fish oil-omega-3 fatty acids 1000 MG capsule Take 1 g by mouth daily.      . fluticasone (FLONASE) 50 MCG/ACT nasal spray PLACE 2 SPRAYS INTO BOTH NOSTRILS DAILY. 16 g 3  . Lancets (FREESTYLE) lancets CHECK BLOOD SUGAR 1-2 TIMES A DAY. DX: E11.9 NON INSULIN DEPENDENT 100 each 3  . Multiple Vitamin (MULTIVITAMIN WITH MINERALS) TABS tablet Take 1 tablet by mouth daily.    Marland Kitchen omeprazole (PRILOSEC) 40 MG capsule Take 1 capsule (40 mg total) by mouth daily. 90 capsule 3  . Polyethyl Glycol-Propyl Glycol (SYSTANE OP) Place 1 drop into both eyes daily as needed (for dry eyes).     . ramipril (ALTACE) 5 MG capsule TAKE 1 CAPSULE (5 MG TOTAL) BY MOUTH DAILY. 90 capsule 1   Current Facility-Administered Medications on File Prior to Visit  Medication Dose Route Frequency Provider Last Rate Last Dose  . denosumab (PROLIA) injection 60 mg  60 mg Subcutaneous Q6 months Bedsole, Amy E, MD   60 mg at 03/14/16 1431    Allergies  Allergen Reactions  . Atorvastatin     REACTION: intolerant - pt does not remember reaction  . Codeine     REACTION: GI upset  . Rosuvastatin     REACTION: intolerant pt does not remember reaction    Past Medical  History:  Diagnosis Date  . Acute pharyngitis   . Acute sinusitis, unspecified   . Acute upper respiratory infections of unspecified site   . Allergy   . Anal and rectal polyp   . Cataract    History of  . Cavernous angioma   . Degenerative cervical disc 02/2006  . Diabetes mellitus    Type II  . GERD (gastroesophageal reflux disease)   . Gout   . Hemangioma of unspecified site   . Hyperlipidemia   . Hypertension   . Orthostatic hypotension 04/2006   [facility]ospital  . Osteoarthritis   . Osteopenia   . Other abscess of vulva   . Palpitations   . Syncope and collapse    1 episode    Past Surgical History:  Procedure Laterality Date  . ABDOMINAL HYSTERECTOMY    . Aortic dopplers     Mild plaque.  EEG okay  . CATARACT EXTRACTION    . Cavernous angioma    . Cavernous hemangioma  04/2004   Hospital  . TOTAL KNEE ARTHROPLASTY     Right    Family History  Problem Relation Age of Onset  . Heart disease Other        CHF  . Cancer Maternal Aunt  Breast CA    Social History   Social History  . Marital status: Married    Spouse name: N/A  . Number of children: 3  . Years of education: N/A   Occupational History  . 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 Smoking status: Former Research scientist (life sciences)  . Smokeless tobacco: Never Used     Comment: quit 1970  . Alcohol use No  . Drug use: No  . Sexual activity: Not on file   Other Topics Concern  . Not on file   Social History Narrative   Diet: fruit and veggies, water, lean protein.   Activity limited secondary to knee discomfort/arthritis, but going to church exersice group two times a week.                OBJECTIVE: BP 130/80   Pulse 83   Temp 98.2 F (36.8 C)   Ht 5' 3"  (1.6 m)   Wt 121 lb (54.9 kg)   SpO2 98%   BMI 21.43 kg/m   She appears well, vital signs are as noted. Ears normal.  Throat and pharynx normal.  Neck supple. No adenopathy in the neck. Nose  is congested. Sinuses non tender. The chest is clear, without wheezes or rales.  ASSESSMENT:  allergic rhinitis  PLAN: Symptomatic therapy suggested: push fluids, rest and return office visit prn if symptoms persist or worsen. Advised to restart flonase.  She is taking loratidine. Lack of antibiotic effectiveness discussed with her. Call or return to clinic prn if these symptoms worsen or fail to improve as anticipated.

## 2016-11-22 NOTE — Progress Notes (Signed)
Subjective:   Patient ID: [name], female    DOB: 24-Sep-1922, 83 y.o.   MRN: [id number]  ALDINA [name] is a pleasant 83 y.o. year old female patient of Dr. Diona Browner, new to me, who presents to clinic today with No chief complaint on file.  on 11/22/2016  HPI:    Current Outpatient Prescriptions on File Prior to Visit  Medication Sig Dispense Refill  . ACCU-CHEK AVIVA PLUS test strip USE TO CHECK BLOOD SUGAR DAILY AS NEEDED. DX: E11.9 100 each 3  . acetaminophen (TYLENOL) 325 MG tablet Take 650 mg by mouth 2 (two) times daily.    Marland Kitchen aspirin EC 81 MG tablet Take 40.5 mg by mouth at bedtime.     . Blood Glucose Monitoring Suppl (ACCU-CHEK AVIVA PLUS) W/DEVICE KIT Use to check blood sugar daily as needed.  Dx: E11.9 1 kit 0  . Calcium Carbonate-Vitamin D (CALCIUM-D PO) Take 1 tablet by mouth daily.    . fish oil-omega-3 fatty acids 1000 MG capsule Take 1 g by mouth daily.      . fluticasone (FLONASE) 50 MCG/ACT nasal spray PLACE 2 SPRAYS INTO BOTH NOSTRILS DAILY. 16 g 3  . Lancets (FREESTYLE) lancets CHECK BLOOD SUGAR 1-2 TIMES A DAY. DX: E11.9 NON INSULIN DEPENDENT 100 each 3  . Multiple Vitamin (MULTIVITAMIN WITH MINERALS) TABS tablet Take 1 tablet by mouth daily.    Marland Kitchen omeprazole (PRILOSEC) 40 MG capsule Take 1 capsule (40 mg total) by mouth daily. 90 capsule 3  . Polyethyl Glycol-Propyl Glycol (SYSTANE OP) Place 1 drop into both eyes daily as needed (for dry eyes).     . ramipril (ALTACE) 5 MG capsule TAKE 1 CAPSULE (5 MG TOTAL) BY MOUTH DAILY. 90 capsule 1   Current Facility-Administered Medications on File Prior to Visit  Medication Dose Route Frequency Provider Last Rate Last Dose  . denosumab (PROLIA) injection 60 mg  60 mg Subcutaneous Q6 months Bedsole, Amy E, MD   60 mg at 03/14/16 1431    Allergies  Allergen Reactions  . Atorvastatin     REACTION: intolerant - pt does not remember reaction  . Codeine     REACTION: GI upset  . Rosuvastatin     REACTION: intolerant  pt does not remember reaction    Past Medical History:  Diagnosis Date  . Acute pharyngitis   . Acute sinusitis, unspecified   . Acute upper respiratory infections of unspecified site   . Allergy   . Anal and rectal polyp   . Cataract    History of  . Cavernous angioma   . Degenerative cervical disc 02/2006  . Diabetes mellitus    Type II  . GERD (gastroesophageal reflux disease)   . Gout   . Hemangioma of unspecified site   . Hyperlipidemia   . Hypertension   . Orthostatic hypotension 04/2006   Hospital  . Osteoarthritis   . Osteopenia   . Other abscess of vulva   . Palpitations   . Syncope and collapse    1 episode    Past Surgical History:  Procedure Laterality Date  . ABDOMINAL HYSTERECTOMY    . Aortic dopplers     Mild plaque.  EEG okay  . CATARACT EXTRACTION    . Cavernous angioma    . Cavernous hemangioma  04/2004   Hospital  . TOTAL KNEE ARTHROPLASTY     Right    Family History  Problem Relation Age of Onset  . Heart disease Other  CHF  . Cancer Maternal Aunt        Breast CA    Social History   Social History  . Marital status: Married    Spouse name: N/A  . Number of children: 3  . Years of education: N/A   Occupational History  . 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 Smoking status: Former Research scientist (life sciences)  . Smokeless tobacco: Never Used     Comment: quit 1970  . Alcohol use No  . Drug use: No  . Sexual activity: Not on file   Other Topics Concern  . Not on file   Social History Narrative   Diet: fruit and veggies, water, lean protein.   Activity limited secondary to knee discomfort/arthritis, but going to church exersice group two times a week.                The PMH, PSH, Social History, Family History, Medications, and allergies have been reviewed in [facility], and have been updated if relevant.   Review of Systems     Objective:    BP 130/80   Pulse 83   Temp 98.2 F  (36.8 C)   Ht _0  (1.6 m)   Wt 121 lb (54.9 kg)   SpO2 98%   BMI 21.43 kg/m    Physical Exam        Assessment & Plan:   No diagnosis found. No Follow-up on file.

## 2016-11-22 NOTE — Patient Instructions (Signed)
Great to see you. Please restart flonase.

## 2016-12-01 ENCOUNTER — Other Ambulatory Visit: Payer: Self-pay | Admitting: Family Medicine

## 2016-12-01 DIAGNOSIS — E119 Type 2 diabetes mellitus without complications: Secondary | ICD-10-CM

## 2016-12-07 DIAGNOSIS — M19011 Primary osteoarthritis, right shoulder: Secondary | ICD-10-CM | POA: Diagnosis not present

## 2016-12-14 ENCOUNTER — Telehealth: Payer: Self-pay

## 2016-12-14 ENCOUNTER — Other Ambulatory Visit: Payer: Self-pay | Admitting: Family Medicine

## 2016-12-14 DIAGNOSIS — E119 Type 2 diabetes mellitus without complications: Secondary | ICD-10-CM

## 2016-12-14 NOTE — Telephone Encounter (Signed)
Pt got error 1 message x 4 today when trying to test BS; pt was told that error message means the strips have moisture in them and will not work; spoke with Pamalee Leyden at [address] and pt cannot get new order of test strips until 12/27/16 for ins to pay. Out of pocket would be very expensive and Shani recommended pt contact accu chek to see if they would send pt a complimentary bottle of test strips. Pt will call [phone]. Pt voiced understanding and will cb if needed.

## 2016-12-19 ENCOUNTER — Telehealth: Payer: Self-pay | Admitting: Family Medicine

## 2016-12-19 NOTE — Telephone Encounter (Signed)
Caller Name:Taylee Sifuentes  Relationship to Patient:self Best number:[phone] Pharmacy:  Reason for call:  Pt thinks she is overdue for prolia shot. Does she need labs first in order to schedule? Pt request cb

## 2016-12-20 NOTE — Telephone Encounter (Signed)
Patient returned Waynetta's call. °

## 2016-12-20 NOTE — Telephone Encounter (Signed)
Lm on pts vm requesting a call back 

## 2016-12-21 ENCOUNTER — Encounter: Payer: Self-pay | Admitting: Internal Medicine

## 2016-12-21 ENCOUNTER — Ambulatory Visit (INDEPENDENT_AMBULATORY_CARE_PROVIDER_SITE_OTHER): Payer: Medicare Other | Admitting: Internal Medicine

## 2016-12-21 ENCOUNTER — Telehealth: Payer: Self-pay

## 2016-12-21 VITALS — BP 126/86 | HR 78 | Temp 98.1°F | Wt 121.0 lb

## 2016-12-21 DIAGNOSIS — R1032 Left lower quadrant pain: Secondary | ICD-10-CM | POA: Diagnosis not present

## 2016-12-21 DIAGNOSIS — I639 Cerebral infarction, unspecified: Secondary | ICD-10-CM | POA: Diagnosis not present

## 2016-12-21 LAB — POC URINALSYSI DIPSTICK (AUTOMATED)
BILIRUBIN UA: NEGATIVE
Glucose, UA: NEGATIVE
Ketones, UA: NEGATIVE
NITRITE UA: NEGATIVE
PH UA: 6 (ref 5.0–8.0)
Protein, UA: NEGATIVE
Spec Grav, UA: 1.02 (ref 1.010–1.025)
Urobilinogen, UA: 0.2 E.U./dL

## 2016-12-21 MED ORDER — SULFAMETHOXAZOLE-TRIMETHOPRIM 400-80 MG PO TABS: 1.0000 | ORAL_TABLET | Freq: Two times a day (BID) | ORAL | 1 refills | Status: DC

## 2016-12-21 NOTE — Telephone Encounter (Signed)
Lm on pts vm requesting a call back. If pt returns call, pls advise we may proceed with prolia injection and also schedule her a non-fasting lab for calcium

## 2016-12-21 NOTE — Assessment & Plan Note (Addendum)
Symptoms are vague Could be diverticulitis or infectious colitis--in which case she probably doesn't need Rx Checked urinalysis--- 3+ leukocytes and 1+ blood Will give septra SS for 3 days just in case (and send culture) Slow increase in eating and keep up with fluids

## 2016-12-21 NOTE — Patient Instructions (Signed)
Please take the antibiotic starting tonight. If your pain is better fairly quickly, you can stop after 3 days. We will call you on Tuesday about the urine test.

## 2016-12-21 NOTE — Telephone Encounter (Signed)
Will see at OV 

## 2016-12-21 NOTE — Progress Notes (Signed)
Subjective:    Patient ID: [name], female    DOB: 07-Jan-1923, 84 y.o.   MRN: [id number]  HPI Here for acute visit due to abdominal pain  Started with some LMQ pain after supper last night Slept okay Awoke and had some spells of loose stools--3 times Drinking tea to settle her stomach Ate a little oatmeal  No more loose stools Pain is fairly constant No change with drinking.  No N/V No appetite  Current Outpatient Prescriptions on File Prior to Visit  Medication Sig Dispense Refill  . ACCU-CHEK AVIVA PLUS test strip USE TO CHECK BLOOD SUGAR DAILY AS NEEDED. DX: E11.9 100 each 3  . acetaminophen (TYLENOL) 325 MG tablet Take 650 mg by mouth 2 (two) times daily.    Marland Kitchen aspirin EC 81 MG tablet Take 40.5 mg by mouth at bedtime.     . Blood Glucose Monitoring Suppl (ACCU-CHEK AVIVA PLUS) W/DEVICE KIT Use to check blood sugar daily as needed.  Dx: E11.9 1 kit 0  . Calcium Carbonate-Vitamin D (CALCIUM-D PO) Take 1 tablet by mouth daily.    . fish oil-omega-3 fatty acids 1000 MG capsule Take 1 g by mouth daily.      . fluticasone (FLONASE) 50 MCG/ACT nasal spray PLACE 2 SPRAYS INTO BOTH NOSTRILS DAILY. 16 g 3  . Lancets (FREESTYLE) lancets CHECK BLOOD SUGAR 1-2 TIMES A DAY. DX: E11.9 NON INSULIN DEPENDENT 100 each 3  . Multiple Vitamin (MULTIVITAMIN WITH MINERALS) TABS tablet Take 1 tablet by mouth daily.    Marland Kitchen omeprazole (PRILOSEC) 40 MG capsule Take 1 capsule (40 mg total) by mouth daily. 90 capsule 3  . Polyethyl Glycol-Propyl Glycol (SYSTANE OP) Place 1 drop into both eyes daily as needed (for dry eyes).     . ramipril (ALTACE) 5 MG capsule TAKE 1 CAPSULE (5 MG TOTAL) BY MOUTH DAILY. 90 capsule 1   Current Facility-Administered Medications on File Prior to Visit  Medication Dose Route Frequency Provider Last Rate Last Dose  . denosumab (PROLIA) injection 60 mg  60 mg Subcutaneous Q6 months Bedsole, Amy E, MD   60 mg at 03/14/16 1431    Allergies  Allergen Reactions  .  Atorvastatin     REACTION: intolerant - pt does not remember reaction  . Codeine     REACTION: GI upset  . Rosuvastatin     REACTION: intolerant pt does not remember reaction    Past Medical History:  Diagnosis Date  . Acute pharyngitis   . Acute sinusitis, unspecified   . Acute upper respiratory infections of unspecified site   . Allergy   . Anal and rectal polyp   . Cataract    History of  . Cavernous angioma   . Degenerative cervical disc 02/2006  . Diabetes mellitus    Type II  . GERD (gastroesophageal reflux disease)   . Gout   . Hemangioma of unspecified site   . Hyperlipidemia   . Hypertension   . Orthostatic hypotension 04/2006   Hospital  . Osteoarthritis   . Osteopenia   . Other abscess of vulva   . Palpitations   . Syncope and collapse    1 episode    Past Surgical History:  Procedure Laterality Date  . ABDOMINAL HYSTERECTOMY    . Aortic dopplers     Mild plaque.  EEG okay  . CATARACT EXTRACTION    . Cavernous angioma    . Cavernous hemangioma  04/2004   Hospital  . TOTAL  KNEE ARTHROPLASTY     Right    Family History  Problem Relation Age of Onset  . Heart disease Other        CHF  . Cancer Maternal Aunt        Breast CA    Social History   Social History  . Marital status: Married    Spouse name: N/A  . Number of children: 3  . Years of education: N/A   Occupational History  . 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 Smoking status: Former Research scientist (life sciences)  . Smokeless tobacco: Never Used     Comment: quit 1970  . Alcohol use No  . Drug use: No  . Sexual activity: Not on file   Other Topics Concern  . Not on file   Social History Narrative   Diet: fruit and veggies, water, lean protein.   Activity limited secondary to knee discomfort/arthritis, but going to church exersice group two times a week.                Review of Systems Friend had GI bug 4 or 5 days ago No fever No  blood in stool Slight cough from post nasal drip this AM.  No SOB No dysuria or hematuria--just usual urgency/frequency (needs pad)    Objective:   Physical Exam  Constitutional: She appears well-nourished. No distress.  Pulmonary/Chest: Effort normal and breath sounds normal. No respiratory distress. She has no wheezes. She has no rales.  Abdominal: Soft. Bowel sounds are normal. She exhibits no distension. There is no rebound and no guarding.  Tenderness along entire left side and above pelvis (but only with very deep palpation)  Musculoskeletal: She exhibits no edema.          Assessment & Plan:

## 2016-12-21 NOTE — Telephone Encounter (Signed)
Lattie Haw with [facility] said pt has upper abd pain (pain level 3-4) with some distention; no fever, N&V, diarrhea or constipation. Lattie Haw advised pt to go to ED; pt does not want to go to ED and wants appt at [facility]. Dr Silvio Pate will see pt 12/21/16 at 4:15. Lattie Haw will let pt know.

## 2016-12-22 ENCOUNTER — Telehealth: Payer: Self-pay

## 2016-12-22 NOTE — Telephone Encounter (Signed)
Ms. Yeley notified as instructed by telephone.   °

## 2016-12-22 NOTE — Telephone Encounter (Signed)
Pt left v/m; pt seen by Dr Silvio Pate on 12/21/16 and was given Septra DS. Pt wants to know if there is anything else she can take because her side still hurts; pain is not better. [facility] office this [facility].Please advise.

## 2016-12-22 NOTE — Telephone Encounter (Signed)
Reviewed Dr. Everardo Beals note from yesterday  Unclear origin of pain but UTI likely.. Recommend to patient to give antibiotics more time, but if pain severe, not able to keep down med/fluids, fever  or blood in stool over the weekend .Marland Kitchen Go to ER.

## 2016-12-23 LAB — URINE CULTURE

## 2016-12-27 NOTE — Telephone Encounter (Signed)
[facility] lab scheduled to proceed with prolia

## 2016-12-29 ENCOUNTER — Other Ambulatory Visit: Payer: Self-pay | Admitting: Family Medicine

## 2016-12-29 DIAGNOSIS — M810 Age-related osteoporosis without current pathological fracture: Secondary | ICD-10-CM

## 2017-01-02 ENCOUNTER — Other Ambulatory Visit (INDEPENDENT_AMBULATORY_CARE_PROVIDER_SITE_OTHER): Payer: Medicare Other

## 2017-01-02 DIAGNOSIS — M810 Age-related osteoporosis without current pathological fracture: Secondary | ICD-10-CM | POA: Diagnosis not present

## 2017-01-02 LAB — CALCIUM: CALCIUM: 9.7 mg/dL (ref 8.4–10.5)

## 2017-01-09 ENCOUNTER — Ambulatory Visit: Payer: Medicare Other

## 2017-01-15 ENCOUNTER — Telehealth: Payer: Self-pay

## 2017-01-15 NOTE — Telephone Encounter (Signed)
Pt is having a problem with allergies and wants to make sure will not affect her getting prolia shot on 01/16/17. I would have advised pt should not affect getting prolia if pt is not sick, fever, congestion, etc and pt can see how she feels prior to appt on 01/16/17. Pt did not answer phone and no v/m.

## 2017-01-16 ENCOUNTER — Ambulatory Visit (INDEPENDENT_AMBULATORY_CARE_PROVIDER_SITE_OTHER): Payer: Medicare Other

## 2017-01-16 DIAGNOSIS — M810 Age-related osteoporosis without current pathological fracture: Secondary | ICD-10-CM | POA: Diagnosis not present

## 2017-01-16 MED ORDER — DENOSUMAB 60 MG/ML ~~LOC~~ SOLN
60.0000 mg | Freq: Once | SUBCUTANEOUS | Status: AC
  Administered 2017-01-16: 60 mg via SUBCUTANEOUS

## 2017-01-16 NOTE — Telephone Encounter (Signed)
Pt received prolia inj today.

## 2017-01-29 ENCOUNTER — Ambulatory Visit: Payer: Medicare Other | Admitting: Podiatry

## 2017-01-29 DIAGNOSIS — Z23 Encounter for immunization: Secondary | ICD-10-CM | POA: Diagnosis not present

## 2017-01-31 ENCOUNTER — Ambulatory Visit (INDEPENDENT_AMBULATORY_CARE_PROVIDER_SITE_OTHER): Payer: Medicare Other | Admitting: Podiatry

## 2017-01-31 ENCOUNTER — Ambulatory Visit: Payer: Medicare Other | Admitting: Podiatry

## 2017-01-31 ENCOUNTER — Encounter: Payer: Self-pay | Admitting: Podiatry

## 2017-01-31 DIAGNOSIS — B351 Tinea unguium: Secondary | ICD-10-CM | POA: Diagnosis not present

## 2017-01-31 DIAGNOSIS — M79676 Pain in unspecified toe(s): Secondary | ICD-10-CM | POA: Diagnosis not present

## 2017-01-31 NOTE — Progress Notes (Signed)
Subjective: 83 y.o. returns the office today for painful, elongated, thickened toenails which she cannot trim himself. Denies any redness or drainage around the nails. She states that she has some athlete's foot on the right heel but she has been using over-the-counter athlete's foot cream which has been helping. Denies any acute changes since last appointment and no new complaints today. Denies any systemic complaints such as fevers, chills, nausea, vomiting.   PCP: Last saw Dr. Silvio Pate Last Seen:12/21/2016  A1c: 5.9  Objective: AAO 3, NAD DP/PT pulses palpable, CRT less than 3 seconds Nails hypertrophic, dystrophic, elongated, brittle, discolored 10. There is tenderness overlying the nails 1-5 bilaterally. There is no surrounding erythema or drainage along the nail sites. No open lesions or pre-ulcerative lesions are identified. No other areas of tenderness bilateral lower extremities. No overlying edema, erythema, increased warmth. No pain with calf compression, swelling, warmth, erythema.  Assessment: Patient presents with symptomatic onychomycosis  Plan: -Treatment options including alternatives, risks, complications were discussed -Nails sharply debrided 10 without complication/bleeding. -Discussed daily foot inspection. If there are any changes, to call the office immediately.  -Follow-up in 3 months or sooner if any problems are to arise. In the meantime, encouraged to call the office with any questions, concerns, changes symptoms.  Celesta Gentile, DPM

## 2017-02-05 DIAGNOSIS — M19012 Primary osteoarthritis, left shoulder: Secondary | ICD-10-CM | POA: Diagnosis not present

## 2017-02-05 DIAGNOSIS — M19011 Primary osteoarthritis, right shoulder: Secondary | ICD-10-CM | POA: Diagnosis not present

## 2017-02-07 ENCOUNTER — Telehealth: Payer: Self-pay

## 2017-02-07 NOTE — Telephone Encounter (Signed)
PLEASE NOTE: All timestamps contained within this report are represented as [place] Standard Time. CONFIDENTIALTY NOTICE: This fax transmission is intended only for the addressee. It contains information that is legally privileged, confidential or otherwise protected from use or disclosure. If you are not the intended recipient, you are strictly prohibited from reviewing, disclosing, copying using or disseminating any of this information or taking any action in reliance on or regarding this information. If you have received this fax in error, please notify us immediately by telephone so that we can arrange for its return to [place]. Phone: [phone], Toll-Free: [phone], Fax: [phone] Page: 1 of 1 Call Id: [id number] [place] Patient Name: [name] Gender: Female DOB: 1922/11/21 Age: 83 Y 29 D Return Phone Number: [phone] (Primary) Address: City/State/Zip: [place] [address] Client [facility] Night - Client Client Site [place] Physician Eliezer Lofts - MD Contact Type Call Who Is Calling Patient / Member / Family / Caregiver Call Type Triage / Clinical Relationship To Patient Self Return Phone Number [phone] (Primary) Chief Complaint Blood Sugar High Reason for Call Symptomatic / Request for [place] states she is diabetic and had a Cortisone shot for an elbow problem. The caller states her blood sugar is higher today - 152. Should the caller take a Metformin to lower the blood sugar? Translation No Nurse Assessment Nurse: Thad Ranger, RN, Denise Date/Time (Eastern Time): 02/06/2017 7:03:59 PM Confirm and document reason for call. If symptomatic, describe symptoms. ---Caller states she is diabetic and had a Cortisone shot for an elbow problem. The caller states her blood sugar is  higher today - 152. Should the caller take a Metformin to lower the blood sugar? States her BG normally runs 95-100, and the MD took her off of her Metformin, but she has some med on hand that she used to take. States she just wants to know if she needs to take the med. Does the patient have any new or worsening symptoms? ---No Please document clinical information provided and list any resource used. ---Based on RN clinical knowledge, advised steroids can cause hyperglycemia and this may last for several more days. Advised do NOT take the Metformin, but continue her med regimen as prev ordered per MD. Advised to increase water and watch carb intake to keep BG down. Advised to call back for further questions/concerns or if she gets worse. Verb understanding. Guidelines Guideline Title Affirmed Question Affirmed Notes Nurse Date/Time (Eastern Time) Disp. Time [name] Time) Disposition Final User 02/06/2017 7:07:51 PM Clinical Call Yes Thad Ranger, RN, Langley Gauss

## 2017-02-12 ENCOUNTER — Ambulatory Visit (INDEPENDENT_AMBULATORY_CARE_PROVIDER_SITE_OTHER): Payer: Medicare Other

## 2017-02-12 ENCOUNTER — Ambulatory Visit: Payer: Medicare Other | Admitting: Podiatry

## 2017-02-12 VITALS — BP 132/82 | HR 67 | Temp 98.1°F | Ht 63.0 in | Wt 122.2 lb

## 2017-02-12 DIAGNOSIS — Z0000 Encounter for general adult medical examination without abnormal findings: Secondary | ICD-10-CM | POA: Diagnosis not present

## 2017-02-12 DIAGNOSIS — E785 Hyperlipidemia, unspecified: Secondary | ICD-10-CM | POA: Diagnosis not present

## 2017-02-12 LAB — LIPID PANEL
CHOL/HDL RATIO: 3
Cholesterol: 178 mg/dL (ref 0–200)
HDL: 56.6 mg/dL (ref >39.00)
LDL Cholesterol: 103 mg/dL — ABNORMAL HIGH (ref 0–99)
NONHDL: 121.19
Triglycerides: 91 mg/dL (ref 0.0–149.0)
VLDL: 18.2 mg/dL (ref 0.0–40.0)

## 2017-02-12 NOTE — Progress Notes (Signed)
PCP notes:   Health maintenance:  No gaps identified.  Abnormal screenings:   Depression score: 6 Fall risk - hx of multiple falls with injury Hearing - failed  Hearing Screening   125Hz  250Hz  500Hz  1000Hz  2000Hz  3000Hz  4000Hz  6000Hz  8000Hz   Right ear:   0 0 0  0    Left ear:   0 0 0  0     Patient concerns:   None  Nurse concerns:  None  Next PCP appt:   02/20/17 @ 1400

## 2017-02-12 NOTE — Progress Notes (Signed)
Pre visit review using our clinic review tool, if applicable. No additional management support is needed unless otherwise documented below in the visit note. 

## 2017-02-12 NOTE — Patient Instructions (Signed)
Ms. Rion , Thank you for taking time to come for your Medicare Wellness Visit. I appreciate your ongoing commitment to your health goals. Please review the following plan we discussed and let me know if I can assist you in the future.   These are the goals we discussed: Goals    . Increase water intake          Starting 02/12/2017, I will attempt to drink at least 6-8 glasses of water daily.        This is a list of the screening recommended for you and due dates:  Health Maintenance  Topic Date Due  . Hemoglobin A1C  05/10/2017  . Eye exam for diabetics  11/06/2017  . Complete foot exam   11/14/2017  . Tetanus Vaccine  10/30/2019  . Flu Shot  Completed  . DEXA scan (bone density measurement)  Completed  . Pneumonia vaccines  Completed   Preventive Care for Adults  A healthy lifestyle and preventive care can promote health and wellness. Preventive health guidelines for adults include the following key practices.  . A routine yearly physical is a good way to check with your health care provider about your health and preventive screening. It is a chance to share any concerns and updates on your health and to receive a thorough exam.  . Visit your dentist for a routine exam and preventive care every 6 months. Brush your teeth twice a day and floss once a day. Good oral hygiene prevents tooth decay and gum disease.  . The frequency of eye exams is based on your age, health, family medical history, use  of contact lenses, and other factors. Follow your health care provider's recommendations for frequency of eye exams.  . Eat a healthy diet. Foods like vegetables, fruits, whole grains, low-fat dairy products, and lean protein foods contain the nutrients you need without too many calories. Decrease your intake of foods high in solid fats, added sugars, and salt. Eat the right amount of calories for you. Get information about a proper diet from your health care provider, if  necessary.  . Regular physical exercise is one of the most important things you can do for your health. Most adults should get at least 150 minutes of moderate-intensity exercise (any activity that increases your heart rate and causes you to sweat) each week. In addition, most adults need muscle-strengthening exercises on 2 or more days a week.  Silver Sneakers may be a benefit available to you. To determine eligibility, you may visit the website: www.silversneakers.com or contact program at 240-069-0597 Mon-Fri between 8AM-8PM.   . Maintain a healthy weight. The body mass index (BMI) is a screening tool to identify possible weight problems. It provides an estimate of body fat based on height and weight. Your health care provider can find your BMI and can help you achieve or maintain a healthy weight.   For adults 20 years and older: ? A BMI below 18.5 is considered underweight. ? A BMI of 18.5 to 24.9 is normal. ? A BMI of 25 to 29.9 is considered overweight. ? A BMI of 30 and above is considered obese.   . Maintain normal blood lipids and cholesterol levels by exercising and minimizing your intake of saturated fat. Eat a balanced diet with plenty of fruit and vegetables. Blood tests for lipids and cholesterol should begin at age 40 and be repeated every 5 years. If your lipid or cholesterol levels are high, you are over 50, or  you are at high risk for heart disease, you may need your cholesterol levels checked more frequently. Ongoing high lipid and cholesterol levels should be treated with medicines if diet and exercise are not working.  . If you smoke, find out from your health care provider how to quit. If you do not use tobacco, please do not start.  . If you choose to drink alcohol, please do not consume more than 2 drinks per day. One drink is considered to be 12 ounces (355 mL) of beer, 5 ounces (148 mL) of wine, or 1.5 ounces (44 mL) of liquor.  . If you are 75-19 years old, ask your  health care provider if you should take aspirin to prevent strokes.  . Use sunscreen. Apply sunscreen liberally and repeatedly throughout the day. You should seek shade when your shadow is shorter than you. Protect yourself by wearing long sleeves, pants, a wide-brimmed hat, and sunglasses year round, whenever you are outdoors.  . Once a month, do a whole body skin exam, using a mirror to look at the skin on your back. Tell your health care provider of new moles, moles that have irregular borders, moles that are larger than a pencil eraser, or moles that have changed in shape or color.

## 2017-02-12 NOTE — Progress Notes (Signed)
I reviewed health advisor's note, was available for consultation, and agree with documentation and plan.   Signed,  Sharyon Peitz T. Quayshawn Nin, MD  

## 2017-02-12 NOTE — Progress Notes (Signed)
Subjective:   [name] is a 83 y.o. female who presents for Medicare Annual (Subsequent) preventive examination.  Review of Systems:  N/A Cardiac Risk Factors include: advanced age (>37mn, >>44women);dyslipidemia     Objective:     Vitals: BP 132/82 (BP Location: Right Arm, Patient Position: Sitting, Cuff Size: Normal)   Pulse 67   Temp 98.1 F (36.7 C) (Oral)   Ht 5' 3"  (1.6 m) Comment: no shoes  Wt 122 lb 4 oz (55.5 kg)   SpO2 98%   BMI 21.66 kg/m   Body mass index is 21.66 kg/m.   Tobacco History  Smoking Status  . Former Smoker  Smokeless Tobacco  . Never Used    Comment: quit 1970     Counseling given: No   Past Medical History:  Diagnosis Date  . Acute pharyngitis   . Acute sinusitis, unspecified   . Acute upper respiratory infections of unspecified site   . Allergy   . Anal and rectal polyp   . Cataract    History of  . Cavernous angioma   . Degenerative cervical disc 02/2006  . Diabetes mellitus    Type II  . GERD (gastroesophageal reflux disease)   . Gout   . Hemangioma of unspecified site   . Hyperlipidemia   . Hypertension   . Orthostatic hypotension 04/2006   Hospital  . Osteoarthritis   . Osteopenia   . Other abscess of vulva   . Palpitations   . Syncope and collapse    1 episode   Past Surgical History:  Procedure Laterality Date  . ABDOMINAL HYSTERECTOMY    . Aortic dopplers     Mild plaque.  EEG okay  . CATARACT EXTRACTION    . Cavernous angioma    . Cavernous hemangioma  04/2004   Hospital  . TOTAL KNEE ARTHROPLASTY     Right   Family History  Problem Relation Age of Onset  . Heart disease Other        CHF  . Cancer Maternal Aunt        Breast CA   History  Sexual Activity  . Sexual activity: Not on file    Outpatient Encounter Prescriptions as of 02/12/2017  Medication Sig  . ACCU-CHEK AVIVA PLUS test strip USE TO CHECK BLOOD SUGAR DAILY AS NEEDED. DX: E11.9  . acetaminophen (TYLENOL) 325 MG tablet  Take 650 mg by mouth 2 (two) times daily.  .Marland Kitchenaspirin EC 81 MG tablet Take 40.5 mg by mouth at bedtime.   . Blood Glucose Monitoring Suppl (ACCU-CHEK AVIVA PLUS) W/DEVICE KIT Use to check blood sugar daily as needed.  Dx: E11.9  . Calcium Carbonate-Vitamin D (CALCIUM-D PO) Take 1 tablet by mouth daily.  . fish oil-omega-3 fatty acids 1000 MG capsule Take 1 g by mouth daily.    . fluticasone (FLONASE) 50 MCG/ACT nasal spray PLACE 2 SPRAYS INTO BOTH NOSTRILS DAILY.  .Marland KitchenLancets (FREESTYLE) lancets CHECK BLOOD SUGAR 1-2 TIMES A DAY. DX: E11.9 NON INSULIN DEPENDENT  . Multiple Vitamin (MULTIVITAMIN WITH MINERALS) TABS tablet Take 1 tablet by mouth daily.  .Marland Kitchenomeprazole (PRILOSEC) 40 MG capsule Take 1 capsule (40 mg total) by mouth daily. (Patient taking differently: Take 40 mg by mouth as needed. )  . Polyethyl Glycol-Propyl Glycol (SYSTANE OP) Place 1 drop into both eyes daily as needed (for dry eyes).   . ramipril (ALTACE) 5 MG capsule TAKE 1 CAPSULE (5 MG TOTAL) BY MOUTH DAILY.  . [  DISCONTINUED] sulfamethoxazole-trimethoprim (BACTRIM,SEPTRA) 400-80 MG tablet Take 1 tablet by mouth 2 (two) times daily. (Patient not taking: Reported on 02/12/2017)   No facility-administered encounter medications on file as of 02/12/2017.     Activities of Daily Living In your present state of health, do you have any difficulty performing the following activities: 02/12/2017  Hearing? Y  Vision? N  Difficulty concentrating or making decisions? Y  Walking or climbing stairs? Y  Dressing or bathing? N  Doing errands, shopping? N  Preparing Food and eating ? N  Using the Toilet? N  In the past six months, have you accidently leaked urine? Y  Comment wears pad daily  Do you have problems with loss of bowel control? N  Managing your Medications? N  Managing your Finances? N  Housekeeping or managing your Housekeeping? N  Some recent data might be hidden    Patient Care Team: [name], MD as PCP -  General    Assessment:     Hearing Screening   125Hz  250Hz  500Hz  1000Hz  2000Hz  3000Hz  4000Hz  6000Hz  8000Hz   Right ear:   0 0 0  0    Left ear:   0 0 0  0    Vision Screening Comments: Last vision exam in July 2018   Exercise Activities and Dietary recommendations Current Exercise Habits: The patient does not participate in regular exercise at present, Exercise limited by: None identified  Goals    . Increase water intake          Starting 02/12/2017, I will attempt to drink at least 6-8 glasses of water daily.       Fall Risk Fall Risk  02/12/2017 02/10/2016 01/15/2015 10/07/2013  Falls in the past year? Yes Yes Yes No  Comment tripped while walking into church; lost balance when entering door at home - - -  Number falls in past yr: 2 or more 2 or more 2 or more -  Injury with Fall? Yes Yes No -   Depression Screen PHQ 2/9 Scores 02/12/2017 02/10/2016 01/15/2015 10/07/2013  PHQ - 2 Score 3 0 0 0  PHQ- 9 Score 6 - - -     Cognitive Function MMSE - Mini Mental State Exam 02/12/2017  Orientation to time 5  Orientation to Place 5  Registration 3  Attention/ Calculation 0  Recall 3  Language- name 2 objects 0  Language- repeat 1  Language- follow 3 step command 3  Language- read & follow direction 0  Write a sentence 0  Copy design 0  Total score 20     PLEASE NOTE: A Mini-Cog screen was completed. Maximum score is 20. A value of 0 denotes this part of [place]stein MMSE was not completed or the patient failed this part of the Mini-Cog screening.   Mini-Cog Screening Orientation to Time - Max 5 pts Orientation to Place - Max 5 pts Registration - Max 3 pts Recall - Max 3 pts Language Repeat - Max 1 pts Language Follow 3 Step Command - Max 3 pts     Immunization History  Administered Date(s) Administered  . Influenza Split 01/16/2012  . Influenza Whole 01/27/2003, 01/25/2007, 02/14/2011  . Influenza, High Dose Seasonal PF 01/14/2014, 01/18/2016, 01/29/2017  .  Influenza,inj,Quad PF,6+ Mos 01/19/2015  . Pneumococcal Conjugate-13 10/07/2013  . Pneumococcal Polysaccharide-23 02/22/1994  . Td 04/24/1997, 10/29/2009  . Zoster 02/28/2008   Screening Tests Health Maintenance  Topic Date Due  . HEMOGLOBIN A1C  05/10/2017  . OPHTHALMOLOGY EXAM  11/06/2017  . FOOT EXAM  11/14/2017  . TETANUS/TDAP  10/30/2019  . INFLUENZA VACCINE  Completed  . DEXA SCAN  Completed  . PNA vac Low Risk Adult  Completed      Plan:   I have personally reviewed, addressed, and noted the following in the patient's chart:  A. Medical and social history B. Use of alcohol, tobacco or illicit drugs  C. Current medications and supplements D. Functional ability and status E.  Nutritional status F.  Physical activity G. Advance directives H. List of other physicians I.  Hospitalizations, surgeries, and ER visits in previous 12 months J.  [place] to include hearing, vision, cognitive, depression L. Referrals and appointments - none  In addition, I have reviewed and discussed with patient certain preventive protocols, quality metrics, and best practice recommendations. A written personalized care plan for preventive services as well as general preventive health recommendations were provided to patient.  See attached scanned questionnaire for additional information.   Signed,   Lindell Noe, MHA, BS, LPN Health Coach

## 2017-02-20 ENCOUNTER — Ambulatory Visit (INDEPENDENT_AMBULATORY_CARE_PROVIDER_SITE_OTHER): Payer: Medicare Other | Admitting: Family Medicine

## 2017-02-20 ENCOUNTER — Encounter: Payer: Self-pay | Admitting: Family Medicine

## 2017-02-20 VITALS — BP 139/70 | HR 82 | Temp 98.0°F | Ht 63.0 in | Wt 123.5 lb

## 2017-02-20 DIAGNOSIS — H9193 Unspecified hearing loss, bilateral: Secondary | ICD-10-CM

## 2017-02-20 DIAGNOSIS — J3089 Other allergic rhinitis: Secondary | ICD-10-CM | POA: Diagnosis not present

## 2017-02-20 DIAGNOSIS — I10 Essential (primary) hypertension: Secondary | ICD-10-CM

## 2017-02-20 DIAGNOSIS — Z8673 Personal history of transient ischemic attack (TIA), and cerebral infarction without residual deficits: Secondary | ICD-10-CM

## 2017-02-20 DIAGNOSIS — E785 Hyperlipidemia, unspecified: Secondary | ICD-10-CM

## 2017-02-20 DIAGNOSIS — Z0000 Encounter for general adult medical examination without abnormal findings: Secondary | ICD-10-CM

## 2017-02-20 DIAGNOSIS — H9190 Unspecified hearing loss, unspecified ear: Secondary | ICD-10-CM | POA: Insufficient documentation

## 2017-02-20 DIAGNOSIS — E119 Type 2 diabetes mellitus without complications: Secondary | ICD-10-CM | POA: Diagnosis not present

## 2017-02-20 DIAGNOSIS — M19011 Primary osteoarthritis, right shoulder: Secondary | ICD-10-CM | POA: Diagnosis not present

## 2017-02-20 LAB — HM DIABETES FOOT EXAM

## 2017-02-20 NOTE — Assessment & Plan Note (Signed)
Diet controlled.  

## 2017-02-20 NOTE — Assessment & Plan Note (Signed)
Referral to audiologist

## 2017-02-20 NOTE — Patient Instructions (Addendum)
Try cortisone 10 cream on skin spot on right cheek x 2 weeks.. Call or see derm if not improving.  Call Dr. Laurena Bering office to let her know that right shoulder is no better after injection.. Any other recommendation.  Please stop at the front desk to set up referral for audiologist.

## 2017-02-20 NOTE — Progress Notes (Signed)
Subjective:    Patient ID: [name], female    DOB: [birth year], 83 y.o.   MRN: [id number]  HPI The patient presents for annual medicare wellness, complete physical and review of chronic health problems. He/She also has the following acute concerns today: heaviness of eyes just today, no vision cahnges, not itchy Dry patch on right cheek   The patient saw Candis Musa, LPN for medicare wellness. Note reviewed in detail and important notes copied below. Health maintenance: No gaps identified. Abnormal screenings:  Depression score: 6 Fall risk - hx of multiple falls with injury Hearing - failed             Hearing Screening   125Hz  250Hz  500Hz  1000Hz  2000Hz  3000Hz  4000Hz  6000Hz  8000Hz   Right ear:   0 0 0  0    Left ear:             02/20/17 Today:  Elevated Cholesterol:  Lab Results  Component Value Date   CHOL 178 02/12/2017   HDL 56.60 02/12/2017   LDLCALC 103 (H) 02/12/2017   TRIG 91.0 02/12/2017   CHOLHDL 3 02/12/2017  Using medications without problems: Muscle aches: none Diet compliance: good Exercise: walks some, uses cane Other complaints:   Wt Readings from Last 3 Encounters:  02/20/17 123 lb 8 oz (56 kg)  02/12/17 122 lb 4 oz (55.5 kg)  12/21/16 121 lb (54.9 kg)    Diabetes:   Controlled with  diet Lab Results  Component Value Date   HGBA1C 5.9 11/07/2016  Using medications without difficulties: Hypoglycemic episodes: Hyperglycemic episodes: Feet problems: non ulcer Blood Sugars averaging: eye exam within last year: yes  Hypertension:   On ACEI  Well controlled on current regimen Using medication without problems or lightheadedness:  none Chest pain with exertion: none Edema: none Short of breath: none Average home BPs: Other issues:  Seeing Dr. Lynann Bologna for shoulder pain.  2 weeks ago steroid injection did not help.  Using tylenol. Social History /Family History/Past Medical History reviewed in detail and updated in EMR  if needed. Blood pressure 139/70, pulse 82, temperature 98 F (36.7 C), temperature source Oral, height 5\' 3"  (1.6 m), weight 123 lb 8 oz (56 kg).    Review of Systems  Constitutional: Negative for fatigue and fever.  HENT: Negative for ear pain.   Eyes: Negative for pain.  Respiratory: Negative for chest tightness and shortness of breath.   Cardiovascular: Negative for chest pain, palpitations and leg swelling.  Gastrointestinal: Negative for abdominal pain.  Genitourinary: Negative for dysuria.       Objective:   Physical Exam  Constitutional: Vital signs are normal. She appears well-developed and well-nourished. She is cooperative.  Non-toxic appearance. She does not appear ill. No distress.  Elderly female in NAD.Marland Kitchen Slow gait with cane  HENT:  Head: Normocephalic.  Right Ear: Hearing, tympanic membrane, external ear and ear canal normal.  Left Ear: Hearing, tympanic membrane, external ear and ear canal normal.  Nose: Nose normal.  Eyes: Pupils are equal, round, and reactive to light. Conjunctivae, EOM and lids are normal. Lids are everted and swept, no foreign bodies found.  Neck: Trachea normal and normal range of motion. Neck supple. Carotid bruit is not present. No thyroid mass and no thyromegaly present.  Cardiovascular: Normal rate, regular rhythm, S1 normal, S2 normal, normal heart sounds and intact distal pulses.  Exam reveals no gallop.   No murmur heard. Pulmonary/Chest: Effort normal and breath sounds normal. No respiratory  distress. She has no wheezes. She has no rhonchi. She has no rales.  Abdominal: Soft. Normal appearance and bowel sounds are normal. She exhibits no distension, no fluid wave, no abdominal bruit and no mass. There is no hepatosplenomegaly. There is no tenderness. There is no rebound, no guarding and no CVA tenderness. No hernia.  Lymphadenopathy:    She has no cervical adenopathy.    She has no axillary adenopathy.  Neurological: She is alert. She  has normal strength. No cranial nerve deficit or sensory deficit.  Skin: Skin is warm, dry and intact. No rash noted.  Dry flaky skin on right cheek, many SKs or cutaneous horns on entire body  Psychiatric: Her speech is normal and behavior is normal. Judgment normal. Her mood appears not anxious. Cognition and memory are normal. She does not exhibit a depressed mood.     Diabetic foot exam: Normal inspection No skin breakdown No calluses  Normal DP pulses Normal sensation to light touch and monofilament Nails  thickened  Sees podiatry to have nails cut.     Assessment & Plan:  The patient's preventative maintenance and recommended screening tests for an annual wellness exam were reviewed in full today. Brought up to date unless services declined.  Counselled on the importance of diet, exercise, and its role in overall health and mortality. The patient's FH and SH was reviewed, including their home life, tobacco status, and drug and alcohol status.

## 2017-02-20 NOTE — Assessment & Plan Note (Signed)
Likely causing eye heaviness.Marland Kitchen Restart allergy medication.

## 2017-02-20 NOTE — Assessment & Plan Note (Signed)
Minimal improvement with steroid injection.. Likely would benefit from PT referral given decreased mobility.  Pt will discuss with ORTHO.

## 2017-02-20 NOTE — Assessment & Plan Note (Addendum)
Tolerable control at her age.  No current indication for statin at her age.

## 2017-02-20 NOTE — Assessment & Plan Note (Signed)
Well controlled. Continue current medication.  

## 2017-03-01 DIAGNOSIS — M19012 Primary osteoarthritis, left shoulder: Secondary | ICD-10-CM | POA: Diagnosis not present

## 2017-03-01 DIAGNOSIS — M19011 Primary osteoarthritis, right shoulder: Secondary | ICD-10-CM | POA: Diagnosis not present

## 2017-03-12 ENCOUNTER — Encounter: Payer: Self-pay | Admitting: Family Medicine

## 2017-03-12 DIAGNOSIS — H903 Sensorineural hearing loss, bilateral: Secondary | ICD-10-CM | POA: Diagnosis not present

## 2017-03-16 DIAGNOSIS — H019 Unspecified inflammation of eyelid: Secondary | ICD-10-CM | POA: Diagnosis not present

## 2017-03-16 DIAGNOSIS — I10 Essential (primary) hypertension: Secondary | ICD-10-CM | POA: Diagnosis not present

## 2017-03-16 DIAGNOSIS — R0981 Nasal congestion: Secondary | ICD-10-CM | POA: Diagnosis not present

## 2017-04-15 ENCOUNTER — Other Ambulatory Visit: Payer: Self-pay | Admitting: Family Medicine

## 2017-04-19 DIAGNOSIS — M25511 Pain in right shoulder: Secondary | ICD-10-CM | POA: Diagnosis not present

## 2017-04-19 DIAGNOSIS — M25611 Stiffness of right shoulder, not elsewhere classified: Secondary | ICD-10-CM | POA: Diagnosis not present

## 2017-04-19 DIAGNOSIS — M19011 Primary osteoarthritis, right shoulder: Secondary | ICD-10-CM | POA: Diagnosis not present

## 2017-04-22 ENCOUNTER — Other Ambulatory Visit: Payer: Self-pay | Admitting: Family Medicine

## 2017-04-26 DIAGNOSIS — M25611 Stiffness of right shoulder, not elsewhere classified: Secondary | ICD-10-CM | POA: Diagnosis not present

## 2017-04-26 DIAGNOSIS — M25511 Pain in right shoulder: Secondary | ICD-10-CM | POA: Diagnosis not present

## 2017-04-26 DIAGNOSIS — M19011 Primary osteoarthritis, right shoulder: Secondary | ICD-10-CM | POA: Diagnosis not present

## 2017-05-01 DIAGNOSIS — M19011 Primary osteoarthritis, right shoulder: Secondary | ICD-10-CM | POA: Diagnosis not present

## 2017-05-01 DIAGNOSIS — M25611 Stiffness of right shoulder, not elsewhere classified: Secondary | ICD-10-CM | POA: Diagnosis not present

## 2017-05-01 DIAGNOSIS — M25511 Pain in right shoulder: Secondary | ICD-10-CM | POA: Diagnosis not present

## 2017-05-03 ENCOUNTER — Ambulatory Visit (INDEPENDENT_AMBULATORY_CARE_PROVIDER_SITE_OTHER): Payer: Medicare Other | Admitting: Podiatry

## 2017-05-03 ENCOUNTER — Encounter: Payer: Self-pay | Admitting: Podiatry

## 2017-05-03 DIAGNOSIS — B351 Tinea unguium: Secondary | ICD-10-CM | POA: Diagnosis not present

## 2017-05-03 DIAGNOSIS — E119 Type 2 diabetes mellitus without complications: Secondary | ICD-10-CM | POA: Diagnosis not present

## 2017-05-03 DIAGNOSIS — M79676 Pain in unspecified toe(s): Secondary | ICD-10-CM | POA: Diagnosis not present

## 2017-05-04 NOTE — Progress Notes (Signed)
Subjective: 84 y.o. returns the office today for painful, elongated, thickened toenails which she cannot trim himself. Denies any redness or drainage around the nails. Denies any acute changes since last appointment and no new complaints today. Denies any systemic complaints such as fevers, chills, nausea, vomiting.   Objective: AAO 3, NAD DP/PT pulses palpable, CRT less than 3 seconds Nails hypertrophic, dystrophic, elongated, brittle, discolored 10. There is tenderness overlying the nails 1-5 bilaterally. There is no surrounding erythema or drainage along the nail sites. No open lesions or pre-ulcerative lesions are identified. No interdigital maceration.  No other areas of tenderness bilateral lower extremities. No overlying edema, erythema, increased warmth. No pain with calf compression, swelling, warmth, erythema.  Assessment: Symptomatic onychomycosis  Plan: -Treatment options including alternatives, risks, complications were discussed -Nails sharply debrided 10 without complication/bleeding. -Discussed daily foot inspection. If there are any changes, to call the office immediately.  -Follow-up in 3 months or sooner if any problems are to arise. In the meantime, encouraged to call the office with any questions, concerns, changes symptoms.  Celesta Gentile, DPM

## 2017-05-08 DIAGNOSIS — M19011 Primary osteoarthritis, right shoulder: Secondary | ICD-10-CM | POA: Diagnosis not present

## 2017-05-08 DIAGNOSIS — M25511 Pain in right shoulder: Secondary | ICD-10-CM | POA: Diagnosis not present

## 2017-05-08 DIAGNOSIS — M25611 Stiffness of right shoulder, not elsewhere classified: Secondary | ICD-10-CM | POA: Diagnosis not present

## 2017-05-10 DIAGNOSIS — M19011 Primary osteoarthritis, right shoulder: Secondary | ICD-10-CM | POA: Diagnosis not present

## 2017-05-10 DIAGNOSIS — M25611 Stiffness of right shoulder, not elsewhere classified: Secondary | ICD-10-CM | POA: Diagnosis not present

## 2017-05-10 DIAGNOSIS — M25511 Pain in right shoulder: Secondary | ICD-10-CM | POA: Diagnosis not present

## 2017-05-18 ENCOUNTER — Ambulatory Visit: Payer: Medicare Other | Admitting: Family Medicine

## 2017-05-18 DIAGNOSIS — Z0289 Encounter for other administrative examinations: Secondary | ICD-10-CM

## 2017-05-29 ENCOUNTER — Encounter: Payer: Self-pay | Admitting: Family Medicine

## 2017-05-29 ENCOUNTER — Ambulatory Visit (INDEPENDENT_AMBULATORY_CARE_PROVIDER_SITE_OTHER): Payer: Medicare Other | Admitting: Family Medicine

## 2017-05-29 VITALS — BP 128/70 | HR 78 | Temp 98.2°F | Wt 123.0 lb

## 2017-05-29 DIAGNOSIS — J069 Acute upper respiratory infection, unspecified: Secondary | ICD-10-CM

## 2017-05-29 DIAGNOSIS — B9789 Other viral agents as the cause of diseases classified elsewhere: Secondary | ICD-10-CM | POA: Diagnosis not present

## 2017-05-29 NOTE — Patient Instructions (Addendum)
I think you've had an upper respiratory infection. Antibiotics are not needed for this.  Viral infections usually take 7-10 days to resolve.  The cough can last a few weeks to go away. Push fluids and plenty of rest.  Ok to try plain mucinex with plenty of water to help mobilize mucous May take tylenol for discomfort  Please return if you are not improving as expected, or if you have high fevers (>101.5) or difficulty swallowing or worsening productive cough. Call clinic with questions.  Good to see you today. I hope you start feeling better soon.

## 2017-05-29 NOTE — Assessment & Plan Note (Signed)
Largely resolving. Continue supportive care reviewed today. Update if not improving as expected. Pt agrees with plan.

## 2017-05-29 NOTE — Progress Notes (Signed)
BP 128/70 (BP Location: Left Arm, Patient Position: Sitting, Cuff Size: Normal)   Pulse 78   Temp 98.2 F (36.8 C) (Oral)   Wt 123 lb (55.8 kg)   SpO2 97%   BMI 21.79 kg/m    CC: possible sinusitis Subjective:    Patient ID: Ardis Rowan, female    DOB: [birth year], 84 y.o.   MRN: [id number]  HPI: SHERONICA COREY is a 84 y.o. female presenting on 05/29/2017 for URI (Sxs started 05/12/17. C/o nasal congestion, cough and drainage. Tried OTC antihistamine and Mucinex, helpful.)   2 wk h/o cold symptoms - persistent malaise since then. Persistent head congestion, sinus pressure, initially productive cough now better. Overall feeling better but symptoms were worse this morning so she decided to come in for eval. Persistent mild head fullness and bilateral frontal pressure.   No fevers, ear or tooth pain, body aches, ST, PNdrainage.  No sick contacts at home. Ex smoker -quit remotely No h/o asthma.  Has tried loratadine 77m. Has mucinex to try.  Relevant past medical, surgical, family and social history reviewed and updated as indicated. Interim medical history since our last visit reviewed. Allergies and medications reviewed and updated. Outpatient Medications Prior to Visit  Medication Sig Dispense Refill  . ACCU-CHEK AVIVA PLUS test strip USE TO CHECK BLOOD SUGAR DAILY AS NEEDED. DX: E11.9 100 each 3  . acetaminophen (TYLENOL) 325 MG tablet Take 650 mg by mouth 2 (two) times daily.    .Marland Kitchenaspirin EC 81 MG tablet Take 40.5 mg by mouth at bedtime.     . Blood Glucose Monitoring Suppl (ACCU-CHEK AVIVA PLUS) W/DEVICE KIT Use to check blood sugar daily as needed.  Dx: E11.9 1 kit 0  . Calcium Carbonate-Vitamin D (CALCIUM-D PO) Take 1 tablet by mouth daily.    . fish oil-omega-3 fatty acids 1000 MG capsule Take 1 g by mouth daily.      . fluticasone (FLONASE) 50 MCG/ACT nasal spray PLACE 2 SPRAYS INTO BOTH NOSTRILS DAILY. 16 g 3  . Lancets (FREESTYLE) lancets CHECK BLOOD SUGAR 1-2 TIMES  A DAY. DX: E11.9 NON INSULIN DEPENDENT 100 each 3  . Multiple Vitamin (MULTIVITAMIN WITH MINERALS) TABS tablet Take 1 tablet by mouth daily.    .Marland Kitchenomeprazole (PRILOSEC) 40 MG capsule Take 40 mg by mouth daily as needed.    .Vladimir FasterGlycol-Propyl Glycol (SYSTANE OP) Place 1 drop into both eyes daily as needed (for dry eyes).     . ramipril (ALTACE) 5 MG capsule TAKE 1 CAPSULE (5 MG TOTAL) BY MOUTH DAILY. 90 capsule 1   No facility-administered medications prior to visit.      Per HPI unless specifically indicated in ROS section below Review of Systems     Objective:    BP 128/70 (BP Location: Left Arm, Patient Position: Sitting, Cuff Size: Normal)   Pulse 78   Temp 98.2 F (36.8 C) (Oral)   Wt 123 lb (55.8 kg)   SpO2 97%   BMI 21.79 kg/m   Wt Readings from Last 3 Encounters:  05/29/17 123 lb (55.8 kg)  02/20/17 123 lb 8 oz (56 kg)  02/12/17 122 lb 4 oz (55.5 kg)    Physical Exam  Constitutional: She appears well-developed and well-nourished. No distress.  HENT:  Head: Normocephalic and atraumatic.  Right Ear: Hearing, tympanic membrane, external ear and ear canal normal.  Left Ear: Hearing, tympanic membrane, external ear and ear canal normal.  Nose: No mucosal edema (nasal mucosal  pallor) or rhinorrhea. Right sinus exhibits no maxillary sinus tenderness and no frontal sinus tenderness. Left sinus exhibits no maxillary sinus tenderness and no frontal sinus tenderness.  Mouth/Throat: Uvula is midline, oropharynx is clear and moist and mucous membranes are normal. No oropharyngeal exudate, posterior oropharyngeal edema, posterior oropharyngeal erythema or tonsillar abscesses.  Eyes: Conjunctivae and EOM are normal. Pupils are equal, round, and reactive to light. No scleral icterus.  Neck: Normal range of motion. Neck supple.  Cardiovascular: Normal rate, regular rhythm, normal heart sounds and intact distal pulses.  No murmur heard. Pulmonary/Chest: Effort normal and breath  sounds normal. No respiratory distress. She has no wheezes. She has no rales.  Lymphadenopathy:    She has no cervical adenopathy.  Skin: Skin is warm and dry. No rash noted.  Nursing note and vitals reviewed.  Results for orders placed or performed in visit on 02/20/17  HM DIABETES FOOT EXAM  Result Value Ref Range   HM Diabetic Foot Exam done    Lab Results  Component Value Date   HGBA1C 5.9 11/07/2016       Assessment & Plan:   Problem List Items Addressed This Visit    Viral URI with cough    Largely resolving. Continue supportive care reviewed today. Update if not improving as expected. Pt agrees with plan.           Follow up plan: No Follow-up on file.  Ria Bush, MD

## 2017-06-21 DIAGNOSIS — M545 Low back pain: Secondary | ICD-10-CM | POA: Diagnosis not present

## 2017-06-21 DIAGNOSIS — M25551 Pain in right hip: Secondary | ICD-10-CM | POA: Diagnosis not present

## 2017-06-26 ENCOUNTER — Ambulatory Visit: Payer: Self-pay | Admitting: *Deleted

## 2017-06-26 NOTE — Telephone Encounter (Signed)
Pt  Saw    Dr   French Ana   At  [facility]    4  Days    And    Was  Told  About  She has    Arthritis  In  Hip .  She  Has  Been taking  Naprosyn   As     Twice  A  Day  For  3  Days OTC .  Pt  Also takes  Low  Dose ASA  81 mg  /  Day .  She  Was  Prescribed   Meloxicam  7.5 mg  But  Has  Not  Started  Taking it .  After  Reading the  Insert she     Is  Concerned  About  The  Risk of  Heart   Damage/ Kidney / CVA . She   Denies  Any   Of these  Symptoms . But   Wants  Dr  Arley Phenix  Advice  On this  Matter.  Pt  Phone number    [phone] . Pt  Has  A   Dentist appt  This  Am  But  She  Be  Back home  By   1230

## 2017-06-26 NOTE — Telephone Encounter (Signed)
Ms. Rosengrant notified as instructed by telephone.   °

## 2017-06-26 NOTE — Telephone Encounter (Signed)
Okay to use meloxicam  At least for short term 2 weeks course.  Benefit outweighs the risk if her hip pain is significant. Make sure to stop naprosyn or advil while on.

## 2017-06-28 ENCOUNTER — Telehealth: Payer: Self-pay | Admitting: *Deleted

## 2017-06-28 DIAGNOSIS — M25551 Pain in right hip: Secondary | ICD-10-CM | POA: Diagnosis not present

## 2017-06-28 NOTE — Telephone Encounter (Signed)
Information has been submitted to pts insurance for verification of benefits. Awaiting response for coverage  

## 2017-07-02 DIAGNOSIS — M25551 Pain in right hip: Secondary | ICD-10-CM | POA: Diagnosis not present

## 2017-07-04 ENCOUNTER — Telehealth: Payer: Self-pay | Admitting: *Deleted

## 2017-07-04 DIAGNOSIS — M810 Age-related osteoporosis without current pathological fracture: Secondary | ICD-10-CM

## 2017-07-04 NOTE — Telephone Encounter (Signed)
Left message for Ms. [name] that is was time for her Prolia injection.  She needs to come in next week for her calcium level and then the following week for her Prolia injection.  Insurance verification showed patient's out of pocket expense is $0. Ok for [facility] to schedule lab visit next week.  Orders are in [place] and a nurse visit the week of 07/16/2017 for Prolia injection.

## 2017-07-09 NOTE — Telephone Encounter (Signed)
Pt called to find out if taking aleve for her leg hurting will interfere with getting a prolia injection Thursday. She is requesting call from Butch Penny if possible. Call back [phone].

## 2017-07-10 NOTE — Telephone Encounter (Signed)
No problem with aleve and prolia

## 2017-07-10 NOTE — Telephone Encounter (Signed)
Ms. Bergren notified as instructed by telephone.   °

## 2017-07-11 ENCOUNTER — Other Ambulatory Visit: Payer: Self-pay | Admitting: *Deleted

## 2017-07-11 DIAGNOSIS — E119 Type 2 diabetes mellitus without complications: Secondary | ICD-10-CM

## 2017-07-11 MED ORDER — FREESTYLE LANCETS MISC: 3 refills | Status: DC

## 2017-07-12 ENCOUNTER — Other Ambulatory Visit: Payer: Self-pay

## 2017-07-12 ENCOUNTER — Ambulatory Visit (INDEPENDENT_AMBULATORY_CARE_PROVIDER_SITE_OTHER): Payer: Medicare Other | Admitting: Family Medicine

## 2017-07-12 ENCOUNTER — Other Ambulatory Visit: Payer: Self-pay | Admitting: *Deleted

## 2017-07-12 ENCOUNTER — Ambulatory Visit: Payer: Medicare Other

## 2017-07-12 ENCOUNTER — Other Ambulatory Visit: Payer: Medicare Other

## 2017-07-12 ENCOUNTER — Encounter: Payer: Self-pay | Admitting: Family Medicine

## 2017-07-12 ENCOUNTER — Other Ambulatory Visit (INDEPENDENT_AMBULATORY_CARE_PROVIDER_SITE_OTHER): Payer: Medicare Other

## 2017-07-12 VITALS — BP 110/60 | HR 80 | Temp 98.3°F | Ht 63.0 in | Wt 124.5 lb

## 2017-07-12 DIAGNOSIS — M810 Age-related osteoporosis without current pathological fracture: Secondary | ICD-10-CM | POA: Diagnosis not present

## 2017-07-12 DIAGNOSIS — M25551 Pain in right hip: Secondary | ICD-10-CM | POA: Diagnosis not present

## 2017-07-12 NOTE — Progress Notes (Signed)
Subjective:    Patient ID: [name], female    DOB: 1922-06-05, 84 y.o.   MRN: [id number]  HPI    84 year old female with osteoporosis and multiple sites of OA  presents for right hip pain.   She has been having pain in right hip in last month. Was unable to walk after long car ride, improved gradually with time.  Saw ORTHO on Dr.Caffery.[place] of right hip showed OA  MRI showed  Degenerative cahnges and trochanteric bursitis, partial tear of gluteus minimus.  Started on meloxicam which helped symptoms, she then changed to  Naprosyn 500 mg BID,  But sat a bridge game.. Since then paoin has flared up.  Pain is mainly on right lateral and anterior hip.. Pain  Increased with walking.. Pt limping.    Blood pressure 110/60, pulse 80, temperature 98.3 F (36.8 C), temperature source Oral, height 5\' 3"  (1.6 m), weight 124 lb 8 oz (56.5 kg).   Review of Systems  Constitutional: Negative for fatigue and fever.  HENT: Negative for congestion.   Eyes: Negative for pain.  Respiratory: Negative for cough and shortness of breath.   Cardiovascular: Negative for chest pain, palpitations and leg swelling.  Gastrointestinal: Negative for abdominal pain.  Genitourinary: Negative for dysuria and vaginal bleeding.  Musculoskeletal: Negative for back pain.  Neurological: Negative for syncope, light-headedness and headaches.  Psychiatric/Behavioral: Negative for dysphoric mood.       Objective:   Physical Exam  Constitutional: Vital signs are normal. She appears well-developed and well-nourished. She is cooperative.  Non-toxic appearance. She does not appear ill. No distress.  HENT:  Head: Normocephalic.  Right Ear: Hearing, tympanic membrane, external ear and ear canal normal. Tympanic membrane is not erythematous, not retracted and not bulging.  Left Ear: Hearing, tympanic membrane, external ear and ear canal normal. Tympanic membrane is not erythematous, not retracted and not bulging.    Nose: No mucosal edema or rhinorrhea. Right sinus exhibits no maxillary sinus tenderness and no frontal sinus tenderness. Left sinus exhibits no maxillary sinus tenderness and no frontal sinus tenderness.  Mouth/Throat: Uvula is midline, oropharynx is clear and moist and mucous membranes are normal.  Eyes: Pupils are equal, round, and reactive to light. Conjunctivae, EOM and lids are normal. Lids are everted and swept, no foreign bodies found.  Neck: Trachea normal and normal range of motion. Neck supple. Carotid bruit is not present. No thyroid mass and no thyromegaly present.  Cardiovascular: Normal rate, regular rhythm, S1 normal, S2 normal, normal heart sounds, intact distal pulses and normal pulses. Exam reveals no gallop and no friction rub.  No murmur heard. Pulmonary/Chest: Effort normal and breath sounds normal. No tachypnea. No respiratory distress. She has no decreased breath sounds. She has no wheezes. She has no rhonchi. She has no rales.  Abdominal: Soft. Normal appearance and bowel sounds are normal. There is no tenderness.  Musculoskeletal:       Right hip: She exhibits decreased range of motion. She exhibits normal strength.    No pain to palpation in lateral or anterior hip  significant decreased ROM in right hip with faber;s as well as triggers pain.  Neurological: She is alert. Gait abnormal. Coordination normal.  Skin: Skin is warm, dry and intact. No rash noted.  Psychiatric: Her speech is normal and behavior is normal. Judgment and thought content normal. Her mood appears not anxious. Cognition and memory are normal. She does not exhibit a depressed mood.  Assessment & Plan:

## 2017-07-12 NOTE — Patient Instructions (Signed)
Hold naprosyn And repeat a short trail of meloxicam for 3-4 days then go back to using tylenol for pain.  If hip pain returns or never goes away.. Return to see Dr. Lorenz Coaster for a steroid injection.

## 2017-07-12 NOTE — Assessment & Plan Note (Signed)
No current evidence of trochanteric bursitis. Most consistent with OA of hip.  recommended short course of meloxicam despite increased risk of CVD.Marland Kitchen If pain continuing.. Follow up with  ORTHO for possible corticosteroid injection.

## 2017-07-13 LAB — CALCIUM: CALCIUM: 9.6 mg/dL (ref 8.4–10.5)

## 2017-07-19 ENCOUNTER — Ambulatory Visit: Payer: Self-pay

## 2017-07-19 ENCOUNTER — Other Ambulatory Visit: Payer: Self-pay

## 2017-07-19 ENCOUNTER — Encounter (HOSPITAL_COMMUNITY): Payer: Self-pay | Admitting: Emergency Medicine

## 2017-07-19 ENCOUNTER — Emergency Department (HOSPITAL_COMMUNITY): Payer: Medicare Other

## 2017-07-19 ENCOUNTER — Emergency Department (HOSPITAL_COMMUNITY)
Admission: EM | Admit: 2017-07-19 | Discharge: 2017-07-19 | Disposition: A | Discharge status: Home / Self Care | Payer: Medicare Other | Attending: Emergency Medicine | Admitting: Emergency Medicine

## 2017-07-19 ENCOUNTER — Ambulatory Visit: Payer: Medicare Other

## 2017-07-19 DIAGNOSIS — R404 Transient alteration of awareness: Secondary | ICD-10-CM | POA: Diagnosis not present

## 2017-07-19 DIAGNOSIS — Z79899 Other long term (current) drug therapy: Secondary | ICD-10-CM | POA: Diagnosis not present

## 2017-07-19 DIAGNOSIS — J449 Chronic obstructive pulmonary disease, unspecified: Secondary | ICD-10-CM | POA: Diagnosis not present

## 2017-07-19 DIAGNOSIS — Z87891 Personal history of nicotine dependence: Secondary | ICD-10-CM | POA: Diagnosis not present

## 2017-07-19 DIAGNOSIS — E119 Type 2 diabetes mellitus without complications: Secondary | ICD-10-CM | POA: Insufficient documentation

## 2017-07-19 DIAGNOSIS — R42 Dizziness and giddiness: Secondary | ICD-10-CM

## 2017-07-19 DIAGNOSIS — Z7982 Long term (current) use of aspirin: Secondary | ICD-10-CM | POA: Insufficient documentation

## 2017-07-19 DIAGNOSIS — N3000 Acute cystitis without hematuria: Secondary | ICD-10-CM

## 2017-07-19 LAB — CBC
HCT: 39.7 % (ref 36.0–46.0)
Hemoglobin: 13.1 g/dL (ref 12.0–15.0)
MCH: 31.2 pg (ref 26.0–34.0)
MCHC: 33 g/dL (ref 30.0–36.0)
MCV: 94.5 fL (ref 78.0–100.0)
PLATELETS: 260 10*3/uL (ref 150–400)
RBC: 4.2 MIL/uL (ref 3.87–5.11)
RDW: 13.5 % (ref 11.5–15.5)
WBC: 12.6 10*3/uL — AB (ref 4.0–10.5)

## 2017-07-19 LAB — CBG MONITORING, ED: Glucose-Capillary: 87 mg/dL (ref 65–99)

## 2017-07-19 LAB — URINALYSIS, ROUTINE W REFLEX MICROSCOPIC
Bacteria, UA: NONE SEEN
Bilirubin Urine: NEGATIVE
Glucose, UA: NEGATIVE mg/dL
Hgb urine dipstick: NEGATIVE
Ketones, ur: 20 mg/dL — AB
Nitrite: NEGATIVE
PROTEIN: NEGATIVE mg/dL
SPECIFIC GRAVITY, URINE: 1.019 (ref 1.005–1.030)
pH: 5 (ref 5.0–8.0)

## 2017-07-19 LAB — BASIC METABOLIC PANEL
Anion gap: 10 (ref 5–15)
BUN: 32 mg/dL — AB (ref 6–20)
CHLORIDE: 106 mmol/L (ref 101–111)
CO2: 25 mmol/L (ref 22–32)
CREATININE: 1.31 mg/dL — AB (ref 0.44–1.00)
Calcium: 9.9 mg/dL (ref 8.9–10.3)
GFR calc Af Amer: 40 mL/min — ABNORMAL LOW (ref >60)
GFR calc non Af Amer: 34 mL/min — ABNORMAL LOW (ref >60)
GLUCOSE: 102 mg/dL — AB (ref 65–99)
Potassium: 4.4 mmol/L (ref 3.5–5.1)
SODIUM: 141 mmol/L (ref 135–145)

## 2017-07-19 LAB — TROPONIN I

## 2017-07-19 MED ORDER — CEPHALEXIN 500 MG PO CAPS: 500.0000 mg | ORAL_CAPSULE | Freq: Three times a day (TID) | ORAL | 0 refills | Status: DC

## 2017-07-19 NOTE — ED Notes (Signed)
Pt reminded about urine sample. She states that she will attempt when she is finished with her [place] sandwich.

## 2017-07-19 NOTE — ED Notes (Signed)
Bed: WA18 Expected date:  Expected time:  Means of arrival:  Comments: ems 

## 2017-07-19 NOTE — Telephone Encounter (Signed)
Patient called and says "I almost passed out, so I called my neighbor who called 911. EMS is here now and checked me out. They recommend I go to the ER. Do you think I should go? I have an appointment at 1430 to get my Prolia shot. Should I come get it?" I advised her to go to ED as recommended by EMS and I would call to cancel her appointment, patient verbalized understanding.

## 2017-07-19 NOTE — ED Triage Notes (Addendum)
Per EMS pt complaint of dizziness onset 1300 while cooking; denies at present. Orthostatic vitals negative.

## 2017-07-19 NOTE — Telephone Encounter (Signed)
Per chart review tab pt is at [place] ED. 

## 2017-07-19 NOTE — Discharge Instructions (Signed)
Please take full course of antibiotics as directed. Your show you are a bit dehydrated make sure you are drinking plenty of fluids.  Your workup was otherwise reassuring.  Please follow-up with your primary care doctor.  Try keeping a small stack in your purse when your meals are delayed.  Return to the emergency department for repeat episodes similar to today, chest pain, shortness of breath, dizziness or headaches.

## 2017-07-19 NOTE — ED Provider Notes (Signed)
[facility] DEPT Provider Note   CSN: [id number] Arrival date & time: 07/19/17  1421     History   Chief Complaint Chief Complaint  Patient presents with  . Dizziness    HPI [name] is a 84 y.o. female.  [name] is a 84 y.o. Female with history og DM, HTN, HLD, and osteopenia, who presents to the ED for evaluation of dizziness and lightheadedness around 1 PM today while fixing lunch.  Patient reports she was standing at making a sandwich and eating up some soup and she started to feel lightheaded like she was going to pass out, patient reports she did not fall to the ground but walked over and sat down on her couch.  Patient reports after about 5-10 minutes her symptoms completely resolved.  She denies any associated chest pain or shortness of breath, no headache, denies dizziness or sensation of room spinning.  No weakness numbness or tingling in any of her extremities.  No abdominal pain, nausea or vomiting.  Patient does report she is diabetic and typically eats lunch every day at noon but today she was out running some errands and ate later than she expected to, and wonders if this might contribute.  Patient reports she feels completely back to normal now.     Past Medical History:  Diagnosis Date  . Acute pharyngitis   . Acute sinusitis, unspecified   . Acute upper respiratory infections of unspecified site   . Allergy   . Anal and rectal polyp   . Cataract    History of  . Cavernous angioma   . Degenerative cervical disc 02/2006  . Diabetes mellitus    Type II  . GERD (gastroesophageal reflux disease)   . Gout   . Hemangioma of unspecified site   . Hyperlipidemia   . Hypertension   . Orthostatic hypotension 04/2006   Hospital  . Osteoarthritis   . Osteopenia   . Other abscess of vulva   . Palpitations   . Syncope and collapse    1 episode    Patient Active Problem List   Diagnosis Date Noted  . Right hip pain  07/12/2017  . Hearing loss 02/20/2017  . TIA (transient ischemic attack), possible 11/14/2016  . Left carpal tunnel syndrome 08/11/2016  . Fatigue 06/15/2016  . Viral URI with cough 03/21/2016  . General weakness 01/11/2016  . Near syncope 05/07/2015  . Osteoarthritis of right shoulder 05/04/2015  . Counseling regarding end of life decision making 01/15/2015  . Skin lesion of right leg 07/10/2014  . Insomnia 04/10/2014  . Osteoarthritis of right knee 10/14/2013  . Osteoarthritis of left knee 10/14/2013  . Gout, chronic, without tophus 12/13/2009  . Osteoporosis 10/29/2009  . PALPITATIONS, OCCASIONAL 07/07/2008  . Allergic rhinitis 06/02/2008  . HEMANGIOMA 07/30/2006  . Diabetes mellitus type 2, controlled, without complications ([place]) 79/48/0165  . Hyperlipidemia LDL goal <100 07/30/2006  . Essential hypertension, benign 07/30/2006  . GERD 07/30/2006  . RECTAL POLYPS 07/30/2006  . OSTEOARTHRITIS 07/30/2006  . CATARACT, HX OF 07/30/2006    Past Surgical History:  Procedure Laterality Date  . ABDOMINAL HYSTERECTOMY    . Aortic dopplers     Mild plaque.  EEG okay  . CATARACT EXTRACTION    . Cavernous angioma    . Cavernous hemangioma  04/2004   Hospital  . TOTAL KNEE ARTHROPLASTY     Right     OB History   None  Home Medications    Prior to Admission medications   Medication Sig Start Date End Date Taking? Authorizing Provider  ACCU-CHEK AVIVA PLUS test strip USE TO CHECK BLOOD SUGAR DAILY AS NEEDED. DX: E11.9 12/01/16   Bedsole, Amy E, MD  acetaminophen (TYLENOL) 325 MG tablet Take 650 mg by mouth 2 (two) times daily.    [provider]  aspirin EC 81 MG tablet Take 40.5 mg by mouth at bedtime.     [provider]  Blood Glucose Monitoring Suppl (ACCU-CHEK AVIVA PLUS) W/DEVICE KIT Use to check blood sugar daily as needed.  Dx: E11.9 09/08/14   [name], MD  Calcium Carbonate-Vitamin D (CALCIUM-D PO) Take 1 tablet by mouth daily.     [provider]  fish oil-omega-3 fatty acids 1000 MG capsule Take 1 g by mouth daily.      [provider]  fluticasone (FLONASE) 50 MCG/ACT nasal spray PLACE 2 SPRAYS INTO BOTH NOSTRILS DAILY. 04/16/17   Bedsole, Amy E, MD  Lancets (FREESTYLE) lancets CHECK BLOOD SUGAR 1-2 TIMES A DAY. DX: E11.9 NON INSULIN DEPENDENT 07/11/17   Bedsole, Amy E, MD  meloxicam (MOBIC) 7.5 MG tablet TAKE 1 TABLET BY MOUTH ONCE DAILY WITH FOOD 06/21/17   [provider]  Multiple Vitamin (MULTIVITAMIN WITH MINERALS) TABS tablet Take 1 tablet by mouth daily.    [provider]  naproxen sodium (ALEVE) 220 MG tablet Take 220 mg by mouth 2 (two) times daily as needed.    [provider]  omeprazole (PRILOSEC) 40 MG capsule Take 40 mg by mouth daily as needed.    [provider]  Polyethyl Glycol-Propyl Glycol (SYSTANE OP) Place 1 drop into both eyes daily as needed (for dry eyes).     [provider]  ramipril (ALTACE) 5 MG capsule TAKE 1 CAPSULE (5 MG TOTAL) BY MOUTH DAILY. 04/23/17   [name], MD    Family History Family History  Problem Relation Age of Onset  . Heart disease Other        CHF  . Cancer Maternal Aunt        Breast CA    Social History Social History   Tobacco Use  . Smoking status: Former Research scientist (life sciences)  . Smokeless tobacco: Never Used  . Tobacco comment: quit 1970  Substance Use Topics  . Alcohol use: No    Alcohol/week: 0.0 oz  . Drug use: No     Allergies   Codeine and Statins   Review of Systems Review of Systems  Constitutional: Negative for chills and fever.  HENT: Negative for congestion, rhinorrhea and sore throat.   Eyes: Negative for visual disturbance.  Respiratory: Negative for cough and shortness of breath.   Cardiovascular: Negative for chest pain and leg swelling.  Gastrointestinal: Negative for abdominal pain, diarrhea, nausea and vomiting.  Genitourinary: Negative for dysuria.  Musculoskeletal:  Negative for arthralgias and myalgias.  Skin: Negative for color change, rash and wound.  Neurological: Positive for light-headedness. Negative for dizziness, facial asymmetry, speech difficulty, weakness, numbness and headaches.     Physical Exam Updated Vital Signs BP 115/86 (BP Location: Left Arm)   Pulse 75   Temp 97.8 F (36.6 C) (Oral)   Resp 16   SpO2 100%   Physical Exam  Constitutional: She appears well-developed and well-nourished. No distress.  Very well-appearing for her age, in no acute distress  HENT:  Head: Normocephalic and atraumatic.  Mouth/Throat: Oropharynx is clear and moist.  Eyes:  Pupils are equal, round, and reactive to light. EOM are normal. Right eye exhibits no discharge. Left eye exhibits no discharge.  No nystagmus  Neck: Neck supple.  Cardiovascular: Normal rate, regular rhythm, normal heart sounds and intact distal pulses.  Pulses:      Radial pulses are 2+ on the right side, and 2+ on the left side.       Dorsalis pedis pulses are 2+ on the right side, and 2+ on the left side.  Pulmonary/Chest: Effort normal and breath sounds normal. No stridor. No respiratory distress. She has no wheezes. She has no rales.  Respirations equal and unlabored, patient able to speak in full sentences, lungs clear to auscultation bilaterally  Abdominal: Soft. Bowel sounds are normal. She exhibits no distension and no mass. There is no tenderness. There is no guarding.  Musculoskeletal: She exhibits no edema.  Neurological: She is alert. Coordination normal.  Speech is clear, able to follow commands CN III-XII intact Normal strength in upper and lower extremities bilaterally including dorsiflexion and plantar flexion, strong and equal grip strength Sensation normal to light and sharp touch Moves extremities without ataxia, coordination intact  Skin: Skin is warm and dry. Capillary refill takes less than 2 seconds. She is not diaphoretic.  Psychiatric: She has a normal  mood and affect. Her behavior is normal.  Nursing note and vitals reviewed.    ED Treatments / Results  Labs (all labs ordered are listed, but only abnormal results are displayed) Labs Reviewed  BASIC METABOLIC PANEL - Abnormal; Notable for the following components:      Result Value   Glucose, Bld 102 (*)    BUN 32 (*)    Creatinine, Ser 1.31 (*)    GFR calc non Af Amer 34 (*)    GFR calc Af Amer 40 (*)    All other components within normal limits  CBC - Abnormal; Notable for the following components:   WBC 12.6 (*)    All other components within normal limits  URINALYSIS, ROUTINE W REFLEX MICROSCOPIC - Abnormal; Notable for the following components:   APPearance HAZY (*)    Ketones, ur 20 (*)    Leukocytes, UA TRACE (*)    Squamous Epithelial / LPF 0-5 (*)    All other components within normal limits  TROPONIN I  CBG MONITORING, ED    EKG EKG Interpretation  Date/Time:  Thursday July 19 2017 14:39:19 EDT Ventricular Rate:  71 PR Interval:    QRS Duration: 86 QT Interval:  404 QTC Calculation: 439 R Axis:   -57 Text Interpretation:  Sinus rhythm Multiple ventricular premature complexes Left ventricular hypertrophy Inferior infarct, old Anterior infarct, old No significant change since last tracing Confirmed by Isla Pence [phone]) on 07/19/2017 2:42:54 PM Also confirmed by Isla Pence [phone]), editor [place], Jeannetta Nap [phone])  on 07/19/2017 4:03:12 PM   Radiology Dg Chest 2 View  Result Date: 07/19/2017 CLINICAL DATA:  Dizziness EXAM: CHEST - 2 VIEW COMPARISON:  02/13/2009 FINDINGS: Cardiac shadow is within normal limits. Aortic calcifications are again seen. The lungs are hyper aerated bilaterally. No focal infiltrate or sizable effusion is seen. No acute bony abnormality is noted. IMPRESSION: COPD without acute abnormality. Electronically Signed   By: Inez Catalina M.D.   On: 07/19/2017 15:45    Procedures Procedures (including critical care  time)  Medications Ordered in ED Medications - No data to display   Initial Impression / Assessment and Plan / ED Course  I have reviewed  the triage vital signs and the nursing notes.  Pertinent labs & imaging results that were available during my care of the patient were reviewed by me and considered in my medical decision making (see chart for details).  Patient presents to the ED for evaluation after a brief episode of lightheadedness, she is diabetic and had not eaten several hours, orthostatic vitals negative with EMS.  Patient reports she feels like she is back to normal now.  No associated chest pain or shortness of breath.  On initial exam vitals normal and patient overall well-appearing, alert and oriented, RRR, lungs clear, normal neurologic exam.  We will get basic labs, EKG, troponin, chest x-ray and urine.  Lab evaluation overall very reassuring, EKG without ischemic changes and negative troponin, mild leukocytosis of 12.6, normal hemoglobin, creatinine is slightly elevated at 1.31 I feel this is likely due to dehydration, no other electrolyte derangements, blood glucose is 87.  Urinalysis with some signs of infection there are trace leukocytes present in 6-30 WBCs, given the patient reports she has been feeling a bit off for the past few days we will treat for UTI with Keflex.  Discussed results and plan with patient.  At this time she is stable for discharge home encouraged her to increase water intake.  Prescription for Keflex provided patient follow-up closely with her primary doctor.  Return precautions discussed.  Patient expressed understanding and is in agreement with plan.  Patient discussed with Dr. Gilford Raid, who saw patient as well and agrees with plan.   Final Clinical Impressions(s) / ED Diagnoses   Final diagnoses:  Lightheadedness  Acute cystitis without hematuria    ED Discharge Orders        Ordered    cephALEXin (KEFLEX) 500 MG capsule  3 times daily      07/19/17 1715       Jacqlyn Larsen, [place] 07/19/17 1751    Isla Pence, MD 07/20/17 [phone]

## 2017-07-26 ENCOUNTER — Encounter: Payer: Self-pay | Admitting: Family Medicine

## 2017-07-26 ENCOUNTER — Ambulatory Visit (INDEPENDENT_AMBULATORY_CARE_PROVIDER_SITE_OTHER): Payer: Medicare Other | Admitting: Family Medicine

## 2017-07-26 DIAGNOSIS — R55 Syncope and collapse: Secondary | ICD-10-CM | POA: Diagnosis not present

## 2017-07-26 DIAGNOSIS — M810 Age-related osteoporosis without current pathological fracture: Secondary | ICD-10-CM | POA: Diagnosis not present

## 2017-07-26 MED ORDER — DENOSUMAB 60 MG/ML ~~LOC~~ SOLN
60.0000 mg | Freq: Once | SUBCUTANEOUS | Status: AC
Start: 2017-07-26 — End: 2017-07-26
  Administered 2017-07-26: 60 mg via SUBCUTANEOUS

## 2017-07-26 NOTE — Assessment & Plan Note (Addendum)
Likely related to   possibleUTI ( not verified), mild dehydration and too long between meals. Increase water, have snack in between meals.

## 2017-07-26 NOTE — Progress Notes (Signed)
   Subjective:    Patient ID: [name], female    DOB: July 17, 1922, 84 y.o.   MRN: [id number]  HPI 84 year old female presents for ED visit on 3/28 for short 5-10 min period of dizziness.  She had not eaten for a few hours. EMS orthostatic vitals negative. EKG in NF:AOZHY rhythm Multiple ventricular premature complexes Left ventricular hypertrophy Inferior infarct, old Anterior infarct, old No significant change since last tracing  Neg troponin, glucose 87, nml hemoglobin, slight leukocytosis  Cr slightly elevated at 1.31 Labs showed mild dehydration and UTI. Started on keflex.  Today she reports she is feeling better. She has not had any further presyncopal spells.  She has had some SE to keflex.. Made her feel funny. HAs completed now.  FBS 89-123.Marland Kitchen Usually low 100s. Blood pressure 118/60, pulse 73, temperature 97.9 F (36.6 C), temperature source Oral, height 5\' 3"  (1.6 m), weight 121 lb 8 oz (55.1 kg), SpO2 98 %.   occ drinking ensure. Wt Readings from Last 3 Encounters:  07/26/17 121 lb 8 oz (55.1 kg)  07/12/17 124 lb 8 oz (56.5 kg)  05/29/17 123 lb (55.8 kg)   Meloxicam has helped with hip pain.  Review of Systems  Constitutional: Negative for fatigue and fever.  HENT: Negative for congestion.   Eyes: Negative for pain.  Respiratory: Negative for cough and shortness of breath.   Cardiovascular: Negative for chest pain, palpitations and leg swelling.  Gastrointestinal: Negative for abdominal pain.  Genitourinary: Negative for dysuria and vaginal bleeding.  Musculoskeletal: Negative for back pain.  Neurological: Negative for syncope, light-headedness and headaches.  Psychiatric/Behavioral: Negative for dysphoric mood.       Objective:   Physical Exam  Constitutional: Vital signs are normal. She appears well-developed and well-nourished. She is cooperative.  Non-toxic appearance. She does not appear ill. No distress.  HENT:  Head: Normocephalic.  Right Ear:  Hearing, tympanic membrane, external ear and ear canal normal. Tympanic membrane is not erythematous, not retracted and not bulging.  Left Ear: Hearing, tympanic membrane, external ear and ear canal normal. Tympanic membrane is not erythematous, not retracted and not bulging.  Nose: No mucosal edema or rhinorrhea. Right sinus exhibits no maxillary sinus tenderness and no frontal sinus tenderness. Left sinus exhibits no maxillary sinus tenderness and no frontal sinus tenderness.  Mouth/Throat: Uvula is midline, oropharynx is clear and moist and mucous membranes are normal.  Eyes: Pupils are equal, round, and reactive to light. Conjunctivae, EOM and lids are normal. Lids are everted and swept, no foreign bodies found.  Neck: Trachea normal and normal range of motion. Neck supple. Carotid bruit is not present. No thyroid mass and no thyromegaly present.  Cardiovascular: Normal rate, regular rhythm, S1 normal, S2 normal, normal heart sounds, intact distal pulses and normal pulses. Exam reveals no gallop and no friction rub.  No murmur heard. Pulmonary/Chest: Effort normal and breath sounds normal. No tachypnea. No respiratory distress. She has no decreased breath sounds. She has no wheezes. She has no rhonchi. She has no rales.  Abdominal: Soft. Normal appearance and bowel sounds are normal. There is no tenderness.  Neurological: She is alert.  Skin: Skin is warm, dry and intact. No rash noted.  Psychiatric: Her speech is normal and behavior is normal. Judgment and thought content normal. Her mood appears not anxious. Cognition and memory are normal. She does not exhibit a depressed mood.          Assessment & Plan:

## 2017-07-26 NOTE — Patient Instructions (Signed)
Eat 3 meals daily with snacks in between.  Keep up with water intake.

## 2017-07-26 NOTE — Addendum Note (Signed)
Addended by: Carter Kitten on: 07/26/2017 11:23 AM   Modules accepted: Orders

## 2017-07-26 NOTE — Assessment & Plan Note (Signed)
Given prolia today. 

## 2017-07-27 ENCOUNTER — Telehealth: Payer: Self-pay | Admitting: Family Medicine

## 2017-07-27 NOTE — Telephone Encounter (Signed)
Call to determine pt questions... She had UA positive but no culture done so not sure if true UTI as no urinary symptoms.  Does not really change anything as she has already complete antibiotics.

## 2017-07-27 NOTE — Telephone Encounter (Signed)
Spoke with Ms. [name].  I advised that she had a positive UA in the ED but no culture was done so we can not be sure if true UTI as she was having no urinary symptoms.  Does not really change anything as she has already complete antibiotics.  I advised to make sure she pushes fluid and stay well hydrated and if she has any further symptoms of lightheadedness to call our office.

## 2017-07-27 NOTE — Telephone Encounter (Signed)
Copied from [place]. Topic: Quick Communication - See Telephone Encounter >> Jul 27, 2017  3:24 PM [name] wrote: CRM for notification.   Pt. Was not sure what Dr. Diona Browner said yesterday,  question about the UTI results (this was done at the [facility])   She seems a little confused about it.   See Telephone encounter for: 07/27/17.

## 2017-08-02 ENCOUNTER — Ambulatory Visit (INDEPENDENT_AMBULATORY_CARE_PROVIDER_SITE_OTHER): Payer: Medicare Other | Admitting: Podiatry

## 2017-08-02 ENCOUNTER — Encounter: Payer: Self-pay | Admitting: Podiatry

## 2017-08-02 DIAGNOSIS — E119 Type 2 diabetes mellitus without complications: Secondary | ICD-10-CM

## 2017-08-02 DIAGNOSIS — M79676 Pain in unspecified toe(s): Secondary | ICD-10-CM

## 2017-08-02 DIAGNOSIS — B351 Tinea unguium: Secondary | ICD-10-CM | POA: Diagnosis not present

## 2017-08-05 NOTE — Progress Notes (Signed)
Subjective: 84 y.o. returns the office today for painful, elongated, thickened toenails which she cannot trim himself. Denies any redness or drainage around the nails. Denies any acute changes since last appointment and no new complaints today. Denies any systemic complaints such as fevers, chills, nausea, vomiting.   Objective: AAO 3, NAD DP/PT pulses palpable, CRT less than 3 seconds Nails hypertrophic, dystrophic, elongated, brittle, discolored 10. There is tenderness overlying the nails 1-5 bilaterally. There is no surrounding erythema or drainage along the nail sites. No open lesions or pre-ulcerative lesions are identified. No interdigital maceration.  No other areas of tenderness bilateral lower extremities. No overlying edema, erythema, increased warmth. No pain with calf compression, swelling, warmth, erythema. Overall no changes  Assessment: 84 year old female presents for symptomatic onychomycosis without any new concerns  Plan: -Treatment options including alternatives, risks, complications were discussed -Nails sharply debrided 10 without complication/bleeding. -Discussed daily foot inspection. If there are any changes, to call the office immediately.  -Follow-up in 3 months or sooner if any problems are to arise. In the meantime, encouraged to call the office with any questions, concerns, changes symptoms.  Celesta Gentile, DPM

## 2017-08-07 ENCOUNTER — Ambulatory Visit: Payer: Self-pay | Admitting: *Deleted

## 2017-08-07 ENCOUNTER — Encounter: Payer: Self-pay | Admitting: Family Medicine

## 2017-08-07 ENCOUNTER — Ambulatory Visit (INDEPENDENT_AMBULATORY_CARE_PROVIDER_SITE_OTHER): Payer: Medicare Other | Admitting: Family Medicine

## 2017-08-07 ENCOUNTER — Other Ambulatory Visit: Payer: Self-pay

## 2017-08-07 VITALS — BP 120/70 | HR 95 | Temp 97.7°F | Ht 63.0 in | Wt 120.0 lb

## 2017-08-07 DIAGNOSIS — R1012 Left upper quadrant pain: Secondary | ICD-10-CM | POA: Diagnosis not present

## 2017-08-07 DIAGNOSIS — G8929 Other chronic pain: Secondary | ICD-10-CM | POA: Insufficient documentation

## 2017-08-07 DIAGNOSIS — E119 Type 2 diabetes mellitus without complications: Secondary | ICD-10-CM

## 2017-08-07 LAB — POC URINALSYSI DIPSTICK (AUTOMATED)
BILIRUBIN UA: NEGATIVE
Glucose, UA: NEGATIVE
Nitrite, UA: NEGATIVE
Protein, UA: NEGATIVE
RBC UA: NEGATIVE
SPEC GRAV UA: 1.02 (ref 1.010–1.025)
Urobilinogen, UA: 0.2 E.U./dL
pH, UA: 6 (ref 5.0–8.0)

## 2017-08-07 LAB — POCT GLYCOSYLATED HEMOGLOBIN (HGB A1C): Hemoglobin A1C: 5.7

## 2017-08-07 NOTE — Telephone Encounter (Signed)
Pt  Has  An appt  Today   With  Dr  Diona Browner at  315  Today.advised  Npo   Call back   If  Worse   Reason for Disposition . [1] MODERATE pain (e.g., interferes with normal activities) AND [2] pain comes and goes (cramps) AND [3] present > 24 hours  (Exception: pain with Vomiting or Diarrhea - see that Guideline)  Answer Assessment - Initial Assessment Questions 1. LOCATION: "Where does it hurt?"       l   Side   Below  Rib cage  2. RADIATION: "Does the pain shoot anywhere else?" (e.g., chest, back)       No 3. ONSET: "When did the pain begin?" (e.g., minutes, hours or days ago)         Started  Friday  Had  Some  Stomach  abd pain  Had  Diarrhea   After  Eating  Lunch   Got  Better -  Did  Not  Feel  Good  Over the  Weekend   Decreased  Appetite . Pain  Became  Worse  This  Am    4. SUDDEN: "Gradual or sudden onset?"     Gradually  Over the  Weekend   5. PATTERN "Does the pain come and go, or is it constant?"    - If constant: "Is it getting better, staying the same, or worsening?"      (Note: Constant means the pain never goes away completely; most serious pain is constant and it progresses)     - If intermittent: "How long does it last?" "Do you have pain now?"     (Note: Intermittent means the pain goes away completely between bouts)       intermittant   6. SEVERITY: "How bad is the pain?"  (e.g., Scale 1-10; mild, moderate, or severe)   - MILD (1-3): doesn't interfere with normal activities, abdomen soft and not tender to touch    - MODERATE (4-7): interferes with normal activities or awakens from sleep, tender to touch    - SEVERE (8-10): excruciating pain, doubled over, unable to do any normal activities       Moderate   7. RECURRENT SYMPTOM: "Have you ever had this type of abdominal pain before?" If so, ask: "When was the last time?" and "What happened that time?"         Pt  Has  Had   Similar  Episodes  Usually  Takes  citrasel and  It  Helps   8. CAUSE: "What do you think is  causing the abdominal pain?"         Not  Sure   9. RELIEVING/AGGRAVATING FACTORS: "What makes it better or worse?" (e.g., movement, antacids, bowel movement)     Took  citracel  This  Am had  A  bm     10. OTHER SYMPTOMS: "Has there been any vomiting, diarrhea, constipation, or urine problems?"        Frequent  Urination   11. PREGNANCY: "Is there any chance you are pregnant?" "When was your last menstrual period?"      n/a  Protocols used: ABDOMINAL PAIN - [facility]

## 2017-08-07 NOTE — Assessment & Plan Note (Signed)
Due for A1C  

## 2017-08-07 NOTE — Progress Notes (Signed)
   Subjective:    Patient ID: RAWAN RIENDEAU, female    DOB: 09/19/1922, 84 y.o.   MRN: [id number]  HPI   84 year old female presents with new onset pain in LUQ under rib cage.  She went out to dinner.. eating more fried foods.. fried tomatos and  fried okra on 4/12.. Since then decreased appetite. Had diarrhea x 3-4 hours. No fever, No nausea, no emesis.  Pain in left  Lower abdomen started last night, 2-3/10 on pain scale. Has not eaten much today, 1/2 sandwich,  water   no blood in stool.   Has been taking fiber lately. Took citrucal today.  Had BM, strained with BM. Had to bear down.  Pain has improved some since BM.   No dysuria,   Slight increase in urgency.  Hx of divertculosis  on colonoscopy in past.  She is also over due for her 6 month A1C.  Review of Systems  Constitutional: Negative for fatigue and fever.  HENT: Negative for congestion.   Eyes: Negative for pain.  Respiratory: Negative for cough and shortness of breath.   Cardiovascular: Negative for chest pain, palpitations and leg swelling.  Gastrointestinal: Positive for abdominal pain, constipation and diarrhea.  Genitourinary: Negative for dysuria and vaginal bleeding.  Musculoskeletal: Negative for back pain.  Neurological: Negative for syncope, light-headedness and headaches.  Psychiatric/Behavioral: Negative for dysphoric mood.       Objective:   Physical Exam  Constitutional: Vital signs are normal. She appears well-developed and well-nourished. She is cooperative.  Non-toxic appearance. She does not appear ill. No distress.  HENT:  Head: Normocephalic.  Right Ear: Hearing, tympanic membrane, external ear and ear canal normal. Tympanic membrane is not erythematous, not retracted and not bulging.  Left Ear: Hearing, tympanic membrane, external ear and ear canal normal. Tympanic membrane is not erythematous, not retracted and not bulging.  Nose: No mucosal edema or rhinorrhea. Right sinus exhibits no  maxillary sinus tenderness and no frontal sinus tenderness. Left sinus exhibits no maxillary sinus tenderness and no frontal sinus tenderness.  Mouth/Throat: Uvula is midline, oropharynx is clear and moist and mucous membranes are normal.  Eyes: Pupils are equal, round, and reactive to light. Conjunctivae, EOM and lids are normal. Lids are everted and swept, no foreign bodies found.  Neck: Trachea normal and normal range of motion. Neck supple. Carotid bruit is not present. No thyroid mass and no thyromegaly present.  Cardiovascular: Normal rate, regular rhythm, S1 normal, S2 normal, normal heart sounds, intact distal pulses and normal pulses. Exam reveals no gallop and no friction rub.  No murmur heard. Pulmonary/Chest: Effort normal and breath sounds normal. No tachypnea. No respiratory distress. She has no decreased breath sounds. She has no wheezes. She has no rhonchi. She has no rales.  Abdominal: Soft. Normal appearance and bowel sounds are normal. There is no hepatosplenomegaly. There is tenderness in the left upper quadrant. There is no rigidity, no rebound, no guarding and no CVA tenderness. No hernia.  Neurological: She is alert.  Skin: Skin is warm, dry and intact. No rash noted.  Psychiatric: Her speech is normal and behavior is normal. Judgment and thought content normal. Her mood appears not anxious. Cognition and memory are normal. She does not exhibit a depressed mood.          Assessment & Plan:

## 2017-08-07 NOTE — Patient Instructions (Addendum)
Increase water. Start miralax once daily.  Hold citrucel.  Stop for  POC A1C on you way out.. Finger stick.

## 2017-08-07 NOTE — Addendum Note (Signed)
Addended by: Pilar Grammes on: 08/07/2017 04:42 PM   Modules accepted: Orders

## 2017-08-07 NOTE — Assessment & Plan Note (Signed)
No S/S of diverticulitis..  Pain most consistent with gas, bloating and constipation. Likely bowel irritation for high fat meal and  Start miralax and water.  eval with UA to rule out urinary issue.

## 2017-08-07 NOTE — Addendum Note (Signed)
Addended by: Ellamae Sia on: 08/07/2017 05:40 PM   Modules accepted: Orders

## 2017-08-09 LAB — URINE CULTURE
MICRO NUMBER:: 90468395
SPECIMEN QUALITY: ADEQUATE

## 2017-08-27 ENCOUNTER — Telehealth: Payer: Self-pay | Admitting: Family Medicine

## 2017-08-27 NOTE — Telephone Encounter (Signed)
Copied from [place] [id number]. Topic: Quick Communication - See Telephone Encounter >> Aug 27, 2017  3:08 PM Rutherford Nail, NT wrote: CRM for notification. See Telephone encounter for: 08/27/17. Patient calling and is wanting to know how long she needs to continue taking the Miralax? Does she need to go back on the equate fiber(Citrical)? CB#: [phone]

## 2017-08-28 NOTE — Telephone Encounter (Signed)
Ms. Pollino notified as instructed by telephone.  Patient states understanding. 

## 2017-08-28 NOTE — Telephone Encounter (Signed)
Continue miralax for long term and keep up with water. No need to start back other medication.

## 2017-08-28 NOTE — Telephone Encounter (Signed)
Please call and get update on bowel habits on miralax... Daily BM, soft?

## 2017-08-28 NOTE — Telephone Encounter (Signed)
Spoke with [name].  She states she has BMs every day.  Some are soft and other times it is like pellets.

## 2017-09-07 ENCOUNTER — Ambulatory Visit: Payer: Self-pay

## 2017-09-07 ENCOUNTER — Other Ambulatory Visit (INDEPENDENT_AMBULATORY_CARE_PROVIDER_SITE_OTHER): Payer: Medicare Other

## 2017-09-07 ENCOUNTER — Other Ambulatory Visit: Payer: Medicare Other

## 2017-09-07 ENCOUNTER — Ambulatory Visit (INDEPENDENT_AMBULATORY_CARE_PROVIDER_SITE_OTHER): Payer: Medicare Other | Admitting: Family

## 2017-09-07 ENCOUNTER — Encounter: Payer: Self-pay | Admitting: Family

## 2017-09-07 VITALS — BP 124/80 | HR 74 | Temp 98.5°F | Ht 63.0 in | Wt 120.2 lb

## 2017-09-07 DIAGNOSIS — R109 Unspecified abdominal pain: Secondary | ICD-10-CM | POA: Diagnosis not present

## 2017-09-07 DIAGNOSIS — R1012 Left upper quadrant pain: Secondary | ICD-10-CM | POA: Diagnosis not present

## 2017-09-07 LAB — COMPREHENSIVE METABOLIC PANEL
ALT: 14 U/L (ref 0–35)
AST: 18 U/L (ref 0–37)
Albumin: 4 g/dL (ref 3.5–5.2)
Alkaline Phosphatase: 42 U/L (ref 39–117)
BUN: 19 mg/dL (ref 6–23)
CHLORIDE: 97 meq/L (ref 96–112)
CO2: 29 meq/L (ref 19–32)
Calcium: 9.6 mg/dL (ref 8.4–10.5)
Creatinine, Ser: 0.99 mg/dL (ref 0.40–1.20)
GFR: 55.68 mL/min — ABNORMAL LOW (ref >60.00)
GLUCOSE: 93 mg/dL (ref 70–99)
POTASSIUM: 4.6 meq/L (ref 3.5–5.1)
SODIUM: 131 meq/L — AB (ref 135–145)
TOTAL PROTEIN: 7.4 g/dL (ref 6.0–8.3)
Total Bilirubin: 0.7 mg/dL (ref 0.2–1.2)

## 2017-09-07 LAB — CBC WITH DIFFERENTIAL/PLATELET
BASOS PCT: 0.4 % (ref 0.0–3.0)
Basophils Absolute: 0 10*3/uL (ref 0.0–0.1)
Eosinophils Absolute: 0 10*3/uL (ref 0.0–0.7)
Eosinophils Relative: 0.5 % (ref 0.0–5.0)
HCT: 41.6 % (ref 36.0–46.0)
HEMOGLOBIN: 14.1 g/dL (ref 12.0–15.0)
LYMPHS ABS: 1.8 10*3/uL (ref 0.7–4.0)
Lymphocytes Relative: 18.8 % (ref 12.0–46.0)
MCHC: 34 g/dL (ref 30.0–36.0)
MCV: 92.5 fl (ref 78.0–100.0)
MONO ABS: 0.8 10*3/uL (ref 0.1–1.0)
Monocytes Relative: 8.3 % (ref 3.0–12.0)
NEUTROS ABS: 7 10*3/uL (ref 1.4–7.7)
Neutrophils Relative %: 72 % (ref 43.0–77.0)
Platelets: 283 10*3/uL (ref 150.0–400.0)
RBC: 4.5 Mil/uL (ref 3.87–5.11)
RDW: 14.1 % (ref 11.5–15.5)
WBC: 9.7 10*3/uL (ref 4.0–10.5)

## 2017-09-07 LAB — POC URINALSYSI DIPSTICK (AUTOMATED)
Bilirubin, UA: NEGATIVE
Blood, UA: NEGATIVE
Glucose, UA: NEGATIVE
Ketones, UA: NEGATIVE
Nitrite, UA: NEGATIVE
PROTEIN UA: NEGATIVE
Spec Grav, UA: 1.02 (ref 1.010–1.025)
UROBILINOGEN UA: 0.2 U/dL
pH, UA: 6 (ref 5.0–8.0)

## 2017-09-07 NOTE — Telephone Encounter (Signed)
Pt called back and made appointment with Jodi Mourning, [facility], today at 1120; appointment made per [facility] agent  Juliann Pulse.

## 2017-09-07 NOTE — Progress Notes (Signed)
[name] is a 84 y.o. female with the following history as recorded in EpicCare:  Patient Active Problem List   Diagnosis Date Noted  . Left upper quadrant pain 08/07/2017  . Right hip pain 07/12/2017  . Hearing loss 02/20/2017  . TIA (transient ischemic attack), possible 11/14/2016  . Left carpal tunnel syndrome 08/11/2016  . Fatigue 06/15/2016  . General weakness 01/11/2016  . Near syncope 05/07/2015  . Osteoarthritis of right shoulder 05/04/2015  . Counseling regarding end of life decision making 01/15/2015  . Skin lesion of right leg 07/10/2014  . Insomnia 04/10/2014  . Osteoarthritis of right knee 10/14/2013  . Osteoarthritis of left knee 10/14/2013  . Gout, chronic, without tophus 12/13/2009  . Osteoporosis 10/29/2009  . PALPITATIONS, OCCASIONAL 07/07/2008  . Allergic rhinitis 06/02/2008  . HEMANGIOMA 07/30/2006  . Diabetes mellitus type 2, controlled, without complications ([place]) 80/06/4915  . Hyperlipidemia LDL goal <100 07/30/2006  . Essential hypertension, benign 07/30/2006  . GERD 07/30/2006  . RECTAL POLYPS 07/30/2006  . OSTEOARTHRITIS 07/30/2006  . CATARACT, HX OF 07/30/2006    Current Outpatient Medications  Medication Sig Dispense Refill  . ACCU-CHEK AVIVA PLUS test strip USE TO CHECK BLOOD SUGAR DAILY AS NEEDED. DX: E11.9 100 each 3  . acetaminophen (TYLENOL) 325 MG tablet Take 650 mg by mouth 2 (two) times daily.    Marland Kitchen aspirin EC 81 MG tablet Take 40.5 mg by mouth at bedtime.     . Blood Glucose Monitoring Suppl (ACCU-CHEK AVIVA PLUS) W/DEVICE KIT Use to check blood sugar daily as needed.  Dx: E11.9 1 kit 0  . fish oil-omega-3 fatty acids 1000 MG capsule Take 1 g by mouth daily.      . fluticasone (FLONASE) 50 MCG/ACT nasal spray PLACE 2 SPRAYS INTO BOTH NOSTRILS DAILY. 16 g 3  . Lancets (FREESTYLE) lancets CHECK BLOOD SUGAR 1-2 TIMES A DAY. DX: E11.9 NON INSULIN DEPENDENT 100 each 3  . Multiple Vitamin (MULTIVITAMIN WITH MINERALS) TABS tablet Take 1  tablet by mouth daily.    Marland Kitchen omeprazole (PRILOSEC) 40 MG capsule Take 40 mg by mouth daily as needed.    Vladimir Faster Glycol-Propyl Glycol (SYSTANE OP) Place 1 drop into both eyes daily as needed (for dry eyes).     . ramipril (ALTACE) 5 MG capsule TAKE 1 CAPSULE (5 MG TOTAL) BY MOUTH DAILY. 90 capsule 1   No current facility-administered medications for this visit.     Allergies: Codeine and Statins  Past Medical History:  Diagnosis Date  . Acute pharyngitis   . Acute sinusitis, unspecified   . Acute upper respiratory infections of unspecified site   . Allergy   . Anal and rectal polyp   . Cataract    History of  . Cavernous angioma   . Degenerative cervical disc 02/2006  . Diabetes mellitus    Type II  . GERD (gastroesophageal reflux disease)   . Gout   . Hemangioma of unspecified site   . Hyperlipidemia   . Hypertension   . Orthostatic hypotension 04/2006   Hospital  . Osteoarthritis   . Osteopenia   . Other abscess of vulva   . Palpitations   . Syncope and collapse    1 episode    Past Surgical History:  Procedure Laterality Date  . ABDOMINAL HYSTERECTOMY    . Aortic dopplers     Mild plaque.  EEG okay  . CATARACT EXTRACTION    . Cavernous angioma    . Cavernous hemangioma  04/2004   Hospital  . TOTAL KNEE ARTHROPLASTY     Right    Family History  Problem Relation Age of Onset  . Heart disease Other        CHF  . Cancer Maternal Aunt        Breast CA    Social History   Tobacco Use  . Smoking status: Former Research scientist (life sciences)  . Smokeless tobacco: Never Used  . Tobacco comment: quit 1970  Substance Use Topics  . Alcohol use: No    Alcohol/week: 0.0 oz    Subjective:  Patient presents with her daughter today for concerns recurrent episodes of Left sided abdominal pain; symptoms started yesterday and feeling better today; this is not a new issue for the patient- saw her PCP with these symptoms last month and felt to be related to constipation and in October 2018;  was changed to Miralax; patient does note that she felt more tired in the past 24 hours and it was related to eating; this sensation is better today and she does admit that she did not sleep well 2 nights ago;  denies any blood in the stool; denies any fever; denies any vomiting or urinary discomfort.  Both she and her daughter are poor historians; this type of vague abdominal pain has been documented for this patient over the years; unclear as to the urgency/ emergency nature of her symptoms that required her to be seen at a different clinic with a different provider today; patient repeatedly mentioned that she actually felt much better since have a bowel movement today and then she would began back with complaints of this chronic left flank/ abdominal pain that has been present for the past few months;  Objective:  Vitals:   09/07/17 1146  BP: 124/80  Pulse: 74  Temp: 98.5 F (36.9 C)  TempSrc: Oral  SpO2: 99%  Weight: 120 lb 4 oz (54.5 kg)  Height: 5' 3"  (1.6 m)    General: Well developed, well nourished, in no acute distress; repeats herself while giving history; Skin : Warm and dry.  Head: Normocephalic and atraumatic  Eyes: Sclera and conjunctiva clear; pupils round and reactive to light; extraocular movements intact  Ears: External normal; canals clear; tympanic membranes normal  Oropharynx: Pink, supple. No suspicious lesions  Neck: Supple without thyromegaly, adenopathy  Lungs: Respirations unlabored; clear to auscultation bilaterally without wheeze, rales, rhonchi  Abdomen: Soft; nontender; nondistended; normoactive bowel sounds; no masses or hepatosplenomegaly  Neurologic: Alert and oriented; speech intact; face symmetrical; moves all extremities well; CNII-XII intact without focal deficit   Assessment:  1. Left flank pain   2. Left upper quadrant pain     Plan:  Check CBC, CMP, urine culture today; will update abdominal/ pelvic CT due to length of time patient has had  symptoms- daughter expresses concerns for diverticulitis; encouraged her to follow-up with her PCP for her chronic care needs;   No follow-ups on file.  Orders Placed This Encounter  Procedures  . Urine Culture    Standing Status:   Future    Number of Occurrences:   1    Standing Expiration Date:   09/07/2018  . CT Abdomen Pelvis W Contrast    Standing Status:   Future    Standing Expiration Date:   12/09/2018    Scheduling Instructions:     Please try to schedule today or Monday    Order Specific Question:   If indicated for the ordered procedure, I authorize the  administration of contrast media per Radiology protocol    Answer:   Yes    Order Specific Question:   Preferred imaging location?    Answer:   [address]    Order Specific Question:   Is Oral Contrast requested for this exam?    Answer:   Yes, Per Radiology protocol    Order Specific Question:   Radiology Contrast Protocol - do NOT remove file path    Answer:   \\[email or web address]  . CT ABDOMEN PELVIS W WO CONTRAST    This exam should ONLY be ordered for initial diagnosis or follow up of known pancreatic/liver/renal/bladder masses.    Standing Status:   Future    Standing Expiration Date:   12/09/2018    Order Specific Question:   If indicated for the ordered procedure, I authorize the administration of contrast media per Radiology protocol    Answer:   Yes    Order Specific Question:   Preferred imaging location?    Answer:   [facility]    Order Specific Question:   Is Oral Contrast requested for this exam?    Answer:   Yes, Per Radiology protocol    Order Specific Question:   Radiology Contrast Protocol - do NOT remove file path    Answer:   \\[email or web address]  . CBC w/Diff    Standing Status:   Future    Number of Occurrences:   1    Standing Expiration Date:   09/07/2018  . Comp Met (CMET)    Standing Status:   Future    Number of Occurrences:    1    Standing Expiration Date:   09/07/2018  . POCT Urinalysis Dipstick (Automated)    Requested Prescriptions    No prescriptions requested or ordered in this encounter

## 2017-09-08 LAB — URINE CULTURE
MICRO NUMBER:: 90603451
SPECIMEN QUALITY:: ADEQUATE

## 2017-09-10 ENCOUNTER — Telehealth: Payer: Self-pay | Admitting: Family Medicine

## 2017-09-10 ENCOUNTER — Ambulatory Visit
Admission: RE | Admit: 2017-09-10 | Discharge: 2017-09-10 | Disposition: A | Discharge status: Home / Self Care | Payer: Medicare Other | Source: Ambulatory Visit | Attending: Family | Admitting: Family

## 2017-09-10 DIAGNOSIS — R1012 Left upper quadrant pain: Secondary | ICD-10-CM

## 2017-09-10 DIAGNOSIS — R1032 Left lower quadrant pain: Secondary | ICD-10-CM | POA: Diagnosis not present

## 2017-09-10 MED ORDER — IOPAMIDOL (ISOVUE-300) INJECTION 61%
100.0000 mL | Freq: Once | INTRAVENOUS | Status: AC | PRN
  Administered 2017-09-10: 100 mL via INTRAVENOUS

## 2017-09-10 NOTE — Telephone Encounter (Signed)
Pt has appt 09/11/17 at 9:30 with Dr Diona Browner.

## 2017-09-10 NOTE — Telephone Encounter (Signed)
Latoya from [facility] called to give report re: CT scan abdomen and pelvis.    IMPRESSION: 1. No acute abdominal or pelvic pathology. 2. Lumbar spine spondylosis. 3.  Aortic Atherosclerosis (ICD10-I70.0).

## 2017-09-11 ENCOUNTER — Ambulatory Visit (INDEPENDENT_AMBULATORY_CARE_PROVIDER_SITE_OTHER): Payer: Medicare Other | Admitting: Family Medicine

## 2017-09-11 ENCOUNTER — Encounter: Payer: Self-pay | Admitting: Family Medicine

## 2017-09-11 DIAGNOSIS — I10 Essential (primary) hypertension: Secondary | ICD-10-CM

## 2017-09-11 DIAGNOSIS — R42 Dizziness and giddiness: Secondary | ICD-10-CM | POA: Insufficient documentation

## 2017-09-11 DIAGNOSIS — E119 Type 2 diabetes mellitus without complications: Secondary | ICD-10-CM | POA: Diagnosis not present

## 2017-09-11 DIAGNOSIS — R1012 Left upper quadrant pain: Secondary | ICD-10-CM | POA: Diagnosis not present

## 2017-09-11 LAB — GLUCOSE, POCT (MANUAL RESULT ENTRY): POC GLUCOSE: 142 mg/dL — AB (ref 70–99)

## 2017-09-11 NOTE — Progress Notes (Signed)
POC clu

## 2017-09-11 NOTE — Assessment & Plan Note (Signed)
?   If due to increased BMs... Bowel slightly hyperactive today... Feels better after BM.  4 already today. Hold miralax and use as needed. Can use fiber as needed, increase water.

## 2017-09-11 NOTE — Assessment & Plan Note (Signed)
Blood sugar slightly higher at home fasting lately, but tolerable.   No low in office today.. Not a contributor to dizziness.

## 2017-09-11 NOTE — Progress Notes (Signed)
Subjective:    Patient ID: [name], female    DOB: [birth year], 84 y.o.   MRN: [id number]  HPI  84 year old female presents for follow up.  Seen 07/2017 for LUQ pain after eating out.  UA  And Culture clear   started on miralax for constipation,increase water.  Seen by Dr.  Valere Dross for flank pain, fatigue  cbc, Cmet : sodium low 131, nml Hg Ct Abd pelvic: IMPRESSION: 1. No acute abdominal or pelvic pathology. 2. Lumbar spine spondylosis. 3.  Aortic Atherosclerosis    She has been taking mirlax every day in last week.  In waiting room she started feeling cold and dizzy ( lightheaded no vertigo) Slightly blurred vision. Feels slightly nauseous. 3 BMs this AM. No headache.  No chest pain, no SOB.   No further abdominal pain.. Using miralax.. Having soft daily BM.  Drinking water. Had oatmeal and blueberries this am.  today's BP elevated.. Did take her ramipril today.  She has noted her blood sugar has been elevated in last few days.  FBS 128-137. Lab Results  Component Value Date   HGBA1C 5.7 08/07/2017     BP Readings from Last 3 Encounters:  09/07/17 124/80  08/07/17 120/70  07/26/17 118/60  Pulse 60, temperature 97.6 F (36.4 C), temperature source Oral, height 5\' 3"  (1.6 m), weight 120 lb 8 oz (54.7 kg), SpO2 97 %.   Review of Systems  Constitutional: Negative for fatigue and fever.  HENT: Negative for ear pain.   Eyes: Negative for pain.  Respiratory: Negative for chest tightness and shortness of breath.   Cardiovascular: Negative for chest pain, palpitations and leg swelling.  Gastrointestinal: Negative for abdominal pain.  Genitourinary: Negative for dysuria.  Neurological: Positive for dizziness. Negative for tremors, syncope, facial asymmetry, speech difficulty and headaches.       Objective:   Physical Exam  Constitutional: She is oriented to person, place, and time. Vital signs are normal. She appears well-developed and well-nourished. She is  cooperative.  Non-toxic appearance. She does not appear ill. No distress.  HENT:  Head: Normocephalic.  Right Ear: Hearing, tympanic membrane, external ear and ear canal normal. Tympanic membrane is not erythematous, not retracted and not bulging.  Left Ear: Hearing, tympanic membrane, external ear and ear canal normal. Tympanic membrane is not erythematous, not retracted and not bulging.  Nose: No mucosal edema or rhinorrhea. Right sinus exhibits no maxillary sinus tenderness and no frontal sinus tenderness. Left sinus exhibits no maxillary sinus tenderness and no frontal sinus tenderness.  Mouth/Throat: Uvula is midline, oropharynx is clear and moist and mucous membranes are normal.  Eyes: Pupils are equal, round, and reactive to light. Conjunctivae, EOM and lids are normal. Lids are everted and swept, no foreign bodies found.  Neck: Trachea normal and normal range of motion. Neck supple. Carotid bruit is not present. No thyroid mass and no thyromegaly present.  Cardiovascular: Normal rate, regular rhythm, S1 normal, S2 normal, normal heart sounds, intact distal pulses and normal pulses. Exam reveals no gallop and no friction rub.  No murmur heard. Pulmonary/Chest: Effort normal and breath sounds normal. No tachypnea. No respiratory distress. She has no decreased breath sounds. She has no wheezes. She has no rhonchi. She has no rales.  Abdominal: Soft. Normal appearance and bowel sounds are normal. There is no tenderness.  Neurological: She is alert and oriented to person, place, and time. She has normal strength and normal reflexes. No cranial nerve deficit or sensory  deficit. She exhibits normal muscle tone. She displays a negative Romberg sign. Coordination and gait normal. GCS eye subscore is 4. GCS verbal subscore is 5. GCS motor subscore is 6.  Nml cerebellar exam   No papilledema  Skin: Skin is warm, dry and intact. No rash noted.  Psychiatric: She has a normal mood and affect. Her speech  is normal and behavior is normal. Judgment and thought content normal. Her mood appears not anxious. Cognition and memory are normal. Cognition and memory are not impaired. She does not exhibit a depressed mood. She exhibits normal recent memory and normal remote memory.          Assessment & Plan:

## 2017-09-11 NOTE — Assessment & Plan Note (Addendum)
Pt in office feels better after bowel movement. Possible SE to daily miralax and increase in frequency of BMs.  nml neuro exam.    recent CMET and cbc nml   recent u culture neg x 2.

## 2017-09-11 NOTE — Assessment & Plan Note (Signed)
Slight elevation today, but has bene well controlle don regimen lately. Follow at home.

## 2017-09-11 NOTE — Patient Instructions (Addendum)
Hold miralax for today and tommorrow. Restart if hard stool or straining.   Increase water.  Follow blood pressure at home.. Call if persistently elevated. Okay to use warm compress on skin area on back but no current infection.

## 2017-09-13 ENCOUNTER — Other Ambulatory Visit: Payer: Self-pay

## 2017-09-13 ENCOUNTER — Encounter (HOSPITAL_COMMUNITY): Payer: Self-pay

## 2017-09-13 ENCOUNTER — Ambulatory Visit: Payer: Self-pay

## 2017-09-13 ENCOUNTER — Emergency Department (HOSPITAL_COMMUNITY)
Admission: EM | Admit: 2017-09-13 | Discharge: 2017-09-13 | Disposition: A | Discharge status: Home / Self Care | Payer: Medicare Other | Attending: Emergency Medicine | Admitting: Emergency Medicine

## 2017-09-13 ENCOUNTER — Emergency Department (HOSPITAL_COMMUNITY): Payer: Medicare Other

## 2017-09-13 DIAGNOSIS — Z79899 Other long term (current) drug therapy: Secondary | ICD-10-CM | POA: Insufficient documentation

## 2017-09-13 DIAGNOSIS — Z87891 Personal history of nicotine dependence: Secondary | ICD-10-CM | POA: Insufficient documentation

## 2017-09-13 DIAGNOSIS — J449 Chronic obstructive pulmonary disease, unspecified: Secondary | ICD-10-CM | POA: Diagnosis not present

## 2017-09-13 DIAGNOSIS — Z7982 Long term (current) use of aspirin: Secondary | ICD-10-CM | POA: Insufficient documentation

## 2017-09-13 DIAGNOSIS — I10 Essential (primary) hypertension: Secondary | ICD-10-CM | POA: Insufficient documentation

## 2017-09-13 DIAGNOSIS — Z96651 Presence of right artificial knee joint: Secondary | ICD-10-CM | POA: Insufficient documentation

## 2017-09-13 DIAGNOSIS — E119 Type 2 diabetes mellitus without complications: Secondary | ICD-10-CM | POA: Diagnosis not present

## 2017-09-13 DIAGNOSIS — R030 Elevated blood-pressure reading, without diagnosis of hypertension: Secondary | ICD-10-CM | POA: Diagnosis not present

## 2017-09-13 DIAGNOSIS — R42 Dizziness and giddiness: Secondary | ICD-10-CM | POA: Diagnosis not present

## 2017-09-13 DIAGNOSIS — Z8673 Personal history of transient ischemic attack (TIA), and cerebral infarction without residual deficits: Secondary | ICD-10-CM | POA: Insufficient documentation

## 2017-09-13 LAB — BASIC METABOLIC PANEL
ANION GAP: 9 (ref 5–15)
BUN: 14 mg/dL (ref 6–20)
CHLORIDE: 100 mmol/L — AB (ref 101–111)
CO2: 22 mmol/L (ref 22–32)
CREATININE: 0.93 mg/dL (ref 0.44–1.00)
Calcium: 9 mg/dL (ref 8.9–10.3)
GFR calc non Af Amer: 52 mL/min — ABNORMAL LOW (ref >60)
GFR, EST AFRICAN AMERICAN: 60 mL/min — AB (ref >60)
Glucose, Bld: 90 mg/dL (ref 65–99)
Potassium: 4.5 mmol/L (ref 3.5–5.1)
SODIUM: 131 mmol/L — AB (ref 135–145)

## 2017-09-13 LAB — CBC
HCT: 38.3 % (ref 36.0–46.0)
HEMOGLOBIN: 12.7 g/dL (ref 12.0–15.0)
MCH: 30.2 pg (ref 26.0–34.0)
MCHC: 33.2 g/dL (ref 30.0–36.0)
MCV: 91 fL (ref 78.0–100.0)
PLATELETS: 255 10*3/uL (ref 150–400)
RBC: 4.21 MIL/uL (ref 3.87–5.11)
RDW: 13 % (ref 11.5–15.5)
WBC: 9.2 10*3/uL (ref 4.0–10.5)

## 2017-09-13 LAB — I-STAT TROPONIN, ED: TROPONIN I, POC: 0.01 ng/mL (ref 0.00–0.08)

## 2017-09-13 NOTE — Discharge Instructions (Addendum)
See your doctor tomorrow as planned.  She can review the test results and examination which was done today, through Epic.  Return here, if needed, for problems.

## 2017-09-13 NOTE — ED Notes (Signed)
Pt states she originally went to her PCP for "trouble going to the bathroom".

## 2017-09-13 NOTE — ED Notes (Addendum)
Patient transported to X-ray 

## 2017-09-13 NOTE — Telephone Encounter (Signed)
This encounter was created in error - please disregard.

## 2017-09-13 NOTE — ED Notes (Signed)
Patient transported to X-ray 

## 2017-09-13 NOTE — Telephone Encounter (Signed)
Phone call from pt.  Stated she was to call the office and update Dr. Diona Browner with BP readings, if continuing to be elevated.  Flow Coordinator, Earl Lagos, was notified, and advised to triage the pt.  Pt. reported elevated BP readings the past 2 days.  BP readings reported  from 158/81-159/103.  (see the specific readings in the assessment.)  Stated she was seen on 5/21 in office and advised to continue to monitor and report elevated readings.  Pt. reported checking her BP with her own digital machine several times yesterday and this morning, and then went to [place] to compare with their BP monitor.  Reported BP 159/103 @ 12:00 PM today on monitor at pharmacy.  Denied any blurred vision, headache, dizziness, weakness, chest pain, or shortness of breath associated with elevated BP readings.  Only taking Ramipril for HTN.  Denied missing any doses of BP medication.  Per protocol, advised pt. Needs another office visit for reevaluation of continued elevated BP.  Appt. Scheduled tomorrow with PCP.  Agreed with plan.     Reason for Disposition . Systolic BP  >= 854 OR Diastolic >= 627  Answer Assessment - Initial Assessment Questions 1. BLOOD PRESSURE: "What is the blood pressure?" "Did you take at least two measurements 5 minutes apart?"     5/22  AM 163/86; 5/22 @ 4:00 AM 158/81; 5/23 @ 9:45 AM 161/82; 5/23 @ 11:20 AM 169/93; 5/23 @ 12:00 PM 159/103    2. ONSET: "When did you take your blood pressure?"     Tues. 5/21, in the office  3. HOW: "How did you obtain the blood pressure?" (e.g., visiting nurse, automatic home BP monitor)     Digital cuff 4. HISTORY: "Do you have a history of high blood pressure?"     Recent elevated readings 5. MEDICATIONS: "Are you taking any medications for blood pressure?" "Have you missed any doses recently?"     denied 6. OTHER SYMPTOMS: "Do you have any symptoms?" (e.g., headache, chest pain, blurred vision, difficulty breathing, weakness)     Denied headache,  dizziness, chest pain, blurred vision, shortness of breath, or weakness 7. PREGNANCY: "Is there any chance you are pregnant?" "When was your last menstrual period?"     N/a  Protocols used: HIGH BLOOD PRESSURE-A-AH

## 2017-09-13 NOTE — ED Triage Notes (Signed)
Pt arrived via [facility] from home with c/o hypertension. States that she was seen at her PCP 2 days ago with elevated BP around 160/90. They told her to keep an eye on it so when she called today with elevated pressure they recommended that she come to be evaluated the hospital. Pt A&OX4, denies CP, denies any neuro symptoms - vision, headache, dizziness, or weakness. No weakness for EMS. Ambulated to stretcher.

## 2017-09-13 NOTE — Telephone Encounter (Signed)
Patient called and says "my BP is 206/83, I get cold and feel shaky. I don't have a headache or anything like that. Can I take another ramipril?" I advised I cannot recommend to take additional medications, but I do advise the patient to go to the ED or UC to be evaluated, she verbalized understanding.

## 2017-09-13 NOTE — ED Provider Notes (Signed)
[facility] EMERGENCY DEPARTMENT Provider Note   CSN: [id number] Arrival date & time: 09/13/17  1855     History   Chief Complaint Chief Complaint  Patient presents with  . Hypertension    HPI [name] is a 84 y.o. female.  HPI   She presents for evaluation of high blood pressure, found at home when checking it.  She also went to the drugstore and found it high at 155/80.  She has had pressures higher at home, when checked today it was 206/83.  She denies headache, persistent blurred vision, nausea, vomiting, cough, shortness of breath, weakness or dizziness.  She has been having some irregular stools, and sometimes has to "strain," to have a bowel movement.  She denies anorexia.  She is using MiraLAX occasionally and fiber to help her have bowel movements.  She is taking all of her medications as directed.  There are no other known modifying factors.  Past Medical History:  Diagnosis Date  . Acute pharyngitis   . Acute sinusitis, unspecified   . Acute upper respiratory infections of unspecified site   . Allergy   . Anal and rectal polyp   . Cataract    History of  . Cavernous angioma   . Degenerative cervical disc 02/2006  . Diabetes mellitus    Type II  . GERD (gastroesophageal reflux disease)   . Gout   . Hemangioma of unspecified site   . Hyperlipidemia   . Hypertension   . Orthostatic hypotension 04/2006   Hospital  . Osteoarthritis   . Osteopenia   . Other abscess of vulva   . Palpitations   . Syncope and collapse    1 episode    Patient Active Problem List   Diagnosis Date Noted  . Hyponatremia 09/14/2017  . Dizziness 09/11/2017  . Left upper quadrant pain 08/07/2017  . Right hip pain 07/12/2017  . Hearing loss 02/20/2017  . TIA (transient ischemic attack), possible 11/14/2016  . Left carpal tunnel syndrome 08/11/2016  . Fatigue 06/15/2016  . General weakness 01/11/2016  . Near syncope 05/07/2015  . Osteoarthritis of  right shoulder 05/04/2015  . Counseling regarding end of life decision making 01/15/2015  . Skin lesion of right leg 07/10/2014  . Insomnia 04/10/2014  . Osteoarthritis of right knee 10/14/2013  . Osteoarthritis of left knee 10/14/2013  . Gout, chronic, without tophus 12/13/2009  . Osteoporosis 10/29/2009  . PALPITATIONS, OCCASIONAL 07/07/2008  . Allergic rhinitis 06/02/2008  . HEMANGIOMA 07/30/2006  . Diabetes mellitus type 2, controlled, without complications ([place]) 32/99/2426  . Hyperlipidemia LDL goal <100 07/30/2006  . Essential hypertension, benign 07/30/2006  . GERD 07/30/2006  . RECTAL POLYPS 07/30/2006  . OSTEOARTHRITIS 07/30/2006  . CATARACT, HX OF 07/30/2006    Past Surgical History:  Procedure Laterality Date  . ABDOMINAL HYSTERECTOMY    . Aortic dopplers     Mild plaque.  EEG okay  . CATARACT EXTRACTION    . Cavernous angioma    . Cavernous hemangioma  04/2004   Hospital  . TOTAL KNEE ARTHROPLASTY     Right     OB History   None      Home Medications    Prior to Admission medications   Medication Sig Start Date End Date Taking? Authorizing Provider  acetaminophen (TYLENOL) 325 MG tablet Take 650 mg by mouth 2 (two) times daily.   Yes [provider]  aspirin EC 81 MG tablet Take 40.5 mg by mouth at  bedtime.    Yes [provider]  fish oil-omega-3 fatty acids 1000 MG capsule Take 1 g by mouth daily.     Yes [provider]  fluticasone (FLONASE) 50 MCG/ACT nasal spray PLACE 2 SPRAYS INTO BOTH NOSTRILS DAILY. 04/16/17  Yes Bedsole, Amy E, MD  Multiple Vitamin (MULTIVITAMIN WITH MINERALS) TABS tablet Take 1 tablet by mouth daily.   Yes [provider]  omeprazole (PRILOSEC) 40 MG capsule Take 40 mg by mouth daily as needed.   Yes [provider]  Polyethyl Glycol-Propyl Glycol (SYSTANE OP) Place 1 drop into both eyes daily as needed (for dry eyes).    Yes [provider]  ramipril (ALTACE) 5 MG capsule  TAKE 1 CAPSULE (5 MG TOTAL) BY MOUTH DAILY. 04/23/17  Yes Bedsole, Amy E, MD  ACCU-CHEK AVIVA PLUS test strip USE TO CHECK BLOOD SUGAR DAILY AS NEEDED. DX: E11.9 12/01/16   Bedsole, Amy E, MD  Blood Glucose Monitoring Suppl (ACCU-CHEK AVIVA PLUS) W/DEVICE KIT Use to check blood sugar daily as needed.  Dx: E11.9 09/08/14   [name], MD  Lancets (FREESTYLE) lancets CHECK BLOOD SUGAR 1-2 TIMES A DAY. DX: E11.9 NON INSULIN DEPENDENT 07/11/17   [name], MD    Family History Family History  Problem Relation Age of Onset  . Heart disease Other        CHF  . Cancer Maternal Aunt        Breast CA    Social History Social History   Tobacco Use  . Smoking status: Former Research scientist (life sciences)  . Smokeless tobacco: Never Used  . Tobacco comment: quit 1970  Substance Use Topics  . Alcohol use: No    Alcohol/week: 0.0 oz  . Drug use: No     Allergies   Codeine and Statins   Review of Systems Review of Systems  All other systems reviewed and are negative.    Physical Exam Updated Vital Signs BP (!) 141/81   Pulse 60   Temp 97.7 F (36.5 C) (Oral)   Resp (!) 24   Ht _0  (1.6 m)   Wt 54.4 kg (120 lb)   SpO2 98%   BMI 21.26 kg/m   Physical Exam  Constitutional: She is oriented to person, place, and time. She appears well-developed. No distress.  Elderly, frail  HENT:  Head: Normocephalic and atraumatic.  Eyes: Pupils are equal, round, and reactive to light. Conjunctivae and EOM are normal.  Neck: Normal range of motion and phonation normal. Neck supple.  Cardiovascular: Normal rate and regular rhythm.  Pulmonary/Chest: Effort normal and breath sounds normal. She exhibits no tenderness.  Abdominal: Soft. She exhibits no distension. There is no tenderness. There is no guarding.  Musculoskeletal: Normal range of motion.  Neurological: She is alert and oriented to person, place, and time. No cranial nerve deficit. She exhibits normal muscle tone. Coordination normal.  No  dysarthria, aphasia or nystagmus.  No pronator drift.  No ataxia.  Skin: Skin is warm and dry.  Psychiatric: She has a normal mood and affect. Her behavior is normal. Judgment and thought content normal.  Nursing note and vitals reviewed.    ED Treatments / Results  Labs (all labs ordered are listed, but only abnormal results are displayed) Labs Reviewed  BASIC METABOLIC PANEL - Abnormal; Notable for the following components:      Result Value   Sodium 131 (*)    Chloride 100 (*)    GFR calc non Af  Amer 52 (*)    GFR calc Af Amer 60 (*)    All other components within normal limits  CBC  I-STAT TROPONIN, ED    EKG EKG Interpretation  Date/Time:  Thursday Sep 13 2017 18:59:24 EDT Ventricular Rate:  63 PR Interval:    QRS Duration: 91 QT Interval:  421 QTC Calculation: 431 R Axis:   -50 Text Interpretation:  Sinus rhythm Left ventricular hypertrophy Inferior infarct, old Anterior infarct, old Since last tracing PVC resolved Confirmed by Daleen Bo [phone]) on 09/13/2017 7:51:58 PM Also confirmed by Daleen Bo [phone]), editor Hattie Perch [phone])  on 09/14/2017 7:52:49 AM   Radiology Dg Chest 2 View  Result Date: 09/13/2017 CLINICAL DATA:  Hypertension EXAM: CHEST - 2 VIEW COMPARISON:  07/19/2017 FINDINGS: The lungs are hyperinflated with diffuse interstitial prominence. No focal airspace consolidation or pulmonary edema. No pleural effusion or pneumothorax. Normal cardiomediastinal contours. Bilateral glenohumeral osteoarthrosis. IMPRESSION: COPD without acute airspace disease. Electronically Signed   By: Ulyses Jarred M.D.   On: 09/13/2017 20:05    Procedures Procedures (including critical care time)  Medications Ordered in ED Medications - No data to display   Initial Impression / Assessment and Plan / ED Course  I have reviewed the triage vital signs and the nursing notes.  Pertinent labs & imaging results that were available during my care of the  patient were reviewed by me and considered in my medical decision making (see chart for details).  Clinical Course as of Sep 14 1508  Thu Sep 13, 2017  2100 Normal  I-stat troponin, ED [EW]  2100 Normal  CBC [EW]  2100 Normal except sodium low, chloride low, GFR low  Basic metabolic panel(!) [EW]  7680 No acute disease  DG Chest 2 View [EW]    Clinical Course User Index [EW] Daleen Bo, MD     Patient Vitals for the past 24 hrs:  BP Temp Temp src Pulse Resp SpO2 Height Weight  09/13/17 2108 - 97.7 F (36.5 C) Oral - - - - -  09/13/17 2100 (!) 141/81 - - 60 (!) 24 98 % - -  09/13/17 2030 (!) 158/68 - - - - - - -  09/13/17 2030 (!) 160/80 - - 62 19 99 % - -  09/13/17 2000 (!) 159/113 - - 63 (!) 22 99 % - -  09/13/17 1930 (!) 151/69 - - 63 20 96 % - -  09/13/17 1908 (!) 160/77 (!) 97.4 F (36.3 C) Oral 61 19 99 % - -  09/13/17 1900 (!) 160/77 - - 65 18 98 % - -  09/13/17 1858 - - - - - - _0  (1.6 m) 54.4 kg (120 lb)    8:58 PM Reevaluation with update and discussion. After initial assessment and treatment, an updated evaluation reveals she continues to feel well.  Repeat blood pressure, manually, both arms, is essentially normal.  See nursing documentation.  Findings discussed with patient and family member, all questions were answered. Daleen Bo   Medical Decision Making: Blood pressure elevation at home, improved in ED.  Suspect false elevation related to automated cuff measurements.  Doubt hypertensive urgency, metabolic instability or impending vascular collapse.  CRITICAL CARE-no Performed by: Daleen Bo    Final Clinical Impressions(s) / ED Diagnoses   Final diagnoses:  Hypertension, unspecified type    ED Discharge Orders    None       Daleen Bo, MD 09/14/17 1510

## 2017-09-14 ENCOUNTER — Encounter: Payer: Self-pay | Admitting: Family Medicine

## 2017-09-14 ENCOUNTER — Ambulatory Visit (INDEPENDENT_AMBULATORY_CARE_PROVIDER_SITE_OTHER): Payer: Medicare Other | Admitting: Family Medicine

## 2017-09-14 VITALS — BP 140/66 | HR 83 | Temp 98.7°F | Ht 63.0 in | Wt 119.0 lb

## 2017-09-14 DIAGNOSIS — I10 Essential (primary) hypertension: Secondary | ICD-10-CM | POA: Diagnosis not present

## 2017-09-14 DIAGNOSIS — R1012 Left upper quadrant pain: Secondary | ICD-10-CM

## 2017-09-14 DIAGNOSIS — E871 Hypo-osmolality and hyponatremia: Secondary | ICD-10-CM

## 2017-09-14 NOTE — Progress Notes (Addendum)
Subjective:    Patient ID: [name], female    DOB: Jan 17, 1923, 84 y.o.   MRN: [id number]  HPI  84 year old female presents with continued BP elevations.  She is currently taking 5 mg ramipril.  BP Readings from Last 3 Encounters:  09/14/17 140/66  09/13/17 (!) 141/81  09/11/17 (!) 158/86    At home BPs have been 153-206/81-103.  Went to  ER yesterday on 5/24.  EKG:  troponin : neg  Na 131, nml cr  nml hg, nml wbc  CXR: no acute changes.  No med changes.   BP may be elevated given increased worring about abd issues.    She feels well today, no dizziness, no weakness.  still having pressure ( no pain in left abdomen)  Has stopped miralax  No more straining with BMs.  She has had a lot of gas with BMs. Slight increase in  Burping amnd slightly nausea. Social History /Family History/Past Medical History reviewed in detail and updated in EMR if needed. Blood pressure 140/66, pulse 83, temperature 98.7 F (37.1 C), temperature source Oral, height 5\' 3"  (1.6 m), weight 119 lb (54 kg).   Review of Systems  Constitutional: Negative for fatigue and fever.  HENT: Negative for congestion.   Eyes: Negative for pain.  Respiratory: Negative for cough and shortness of breath.   Cardiovascular: Negative for chest pain, palpitations and leg swelling.  Gastrointestinal: Negative for abdominal pain.  Genitourinary: Negative for dysuria and vaginal bleeding.  Musculoskeletal: Negative for back pain.  Neurological: Negative for syncope, light-headedness and headaches.  Psychiatric/Behavioral: Negative for dysphoric mood.       Objective:   Physical Exam  Constitutional: Vital signs are normal. She appears well-developed and well-nourished. She is cooperative.  Non-toxic appearance. She does not appear ill. No distress.  Elderly female in NAD  HENT:  Head: Normocephalic.  Right Ear: Hearing, tympanic membrane, external ear and ear canal normal. Tympanic membrane is not  erythematous, not retracted and not bulging.  Left Ear: Hearing, tympanic membrane, external ear and ear canal normal. Tympanic membrane is not erythematous, not retracted and not bulging.  Nose: No mucosal edema or rhinorrhea. Right sinus exhibits no maxillary sinus tenderness and no frontal sinus tenderness. Left sinus exhibits no maxillary sinus tenderness and no frontal sinus tenderness.  Mouth/Throat: Uvula is midline, oropharynx is clear and moist and mucous membranes are normal.  Eyes: Pupils are equal, round, and reactive to light. Conjunctivae, EOM and lids are normal. Lids are everted and swept, no foreign bodies found.  Neck: Trachea normal and normal range of motion. Neck supple. Carotid bruit is not present. No thyroid mass and no thyromegaly present.  Cardiovascular: Normal rate, regular rhythm, S1 normal, S2 normal, normal heart sounds, intact distal pulses and normal pulses. Exam reveals no gallop and no friction rub.  No murmur heard. Pulmonary/Chest: Effort normal and breath sounds normal. No tachypnea. No respiratory distress. She has no decreased breath sounds. She has no wheezes. She has no rhonchi. She has no rales.  Abdominal: Soft. Normal appearance and bowel sounds are normal. There is no tenderness.  Neurological: She is alert.  Skin: Skin is warm, dry and intact. No rash noted.  Psychiatric: Her speech is normal and behavior is normal. Judgment and thought content normal. Her mood appears not anxious. Cognition and memory are normal. She does not exhibit a depressed mood.     Diabetic foot exam:  Foot deformity, hammer toe bilaterally No skin  breakdown  Pre-ulcerative  calluses  Normal DP pulses Normal sensation to light touch and monofilament Nails normal      Assessment & Plan:

## 2017-09-14 NOTE — Assessment & Plan Note (Signed)
No longer pain just pressure.Shaune Leeks functional and gas related. Restart align and use miralax as needed.

## 2017-09-14 NOTE — Assessment & Plan Note (Signed)
Increase BP may be anxiety and over checking related. Now today it is at a tolerable level.  Can take extra ramipril if it is very high but tolerate BP up 390-300P systolic.

## 2017-09-14 NOTE — Patient Instructions (Addendum)
Start back align or generic probiotic daily. Use miralax as needed. Do not restrict salt for now.  If at home BP BP is above 470 systolic.. Take an additional ramipril that day only.  Follow BP ay home.

## 2017-09-14 NOTE — Assessment & Plan Note (Signed)
Likely due to recent sodium restriction.. Stop. Recheck at next OV.

## 2017-09-18 ENCOUNTER — Telehealth: Payer: Self-pay

## 2017-09-18 NOTE — Telephone Encounter (Signed)
Pt last seen 09/14/17 and has BP f/u on 09/25/17. I spoke with pt; pt said BP and BS today are doing a lot better; today BP is 147/81 and FBS was 107. FYI to Dr Diona Browner.

## 2017-09-18 NOTE — Telephone Encounter (Signed)
PLEASE NOTE: All timestamps contained within this report are represented as [place] Standard Time. CONFIDENTIALTY NOTICE: This fax transmission is intended only for the addressee. It contains information that is legally privileged, confidential or otherwise protected from use or disclosure. If you are not the intended recipient, you are strictly prohibited from reviewing, disclosing, copying using or disseminating any of this information or taking any action in reliance on or regarding this information. If you have received this fax in error, please notify us immediately by telephone so that we can arrange for its return to [place]. Phone: [phone], Toll-Free: [phone], Fax: [phone] Page: 1 of 2 Call Id: [id number] [place] Patient Name: [name] Gender: Female DOB: [birth year] Age: 84 Y 47 M 10 D Return Phone Number: [phone] (Primary) Address: City/State/Zip: [place] [address] Client [facility] Night - Client Client Site [place] Physician Eliezer Lofts - MD Contact Type Call Who Is Calling Patient / Member / Family / Caregiver Call Type Triage / Clinical Relationship To Patient Self Return Phone Number [phone] (Primary) Chief Complaint Flank Pain Reason for Call Symptomatic / Request for Crystal states took her BS 2 hrs after eating; at first said error; next 185; was in last week and saw Dr; having pain in left side; had CT which showed nothing wrong; was taken off diabetes meds about 6-7 months ago; still has some meds but may be out of date; BP was high in office; went to ED since it was 206; was 138 yesterday; Translation No Nurse Assessment Nurse: Susy Manor, RN, Kristen Date/Time ([place] Time): 09/17/2017 10:56:55 AM Confirm and document reason for call. If  symptomatic, describe symptoms. ---Caller states that she took her blood sugar and it was 185 after she ate. She says her bp was high when she went to the doctor one day last week, but the next day it was 206 so she went to the ER. Saturday her blood pressure was down to 138. 211/107 89pulse. Does the patient have any new or worsening symptoms? ---Yes Will a triage be completed? ---Yes Related visit to physician within the last 2 weeks? ---Yes Does the PT have any chronic conditions? (i.e. diabetes, asthma, etc.) ---Yes List chronic conditions. ---diabetes Is this a behavioral health or substance abuse call? ---No Guidelines Guideline Title Affirmed Question Affirmed Notes Nurse Date/Time (Eastern Time) High Blood Pressure Systolic BP >= 646 OR Diastolic >= 803 Susy Manor, RN, Kristen 09/17/2017 11:04:45 AM Diabetes - High Blood Sugar Blood glucose 70-240 mg/dl (3.9 -13 mmol/l) Susy Manor, RN, Kristen 09/17/2017 11:11:53 AM PLEASE NOTE: All timestamps contained within this report are represented as [place] Standard Time. CONFIDENTIALTY NOTICE: This fax transmission is intended only for the addressee. It contains information that is legally privileged, confidential or otherwise protected from use or disclosure. If you are not the intended recipient, you are strictly prohibited from reviewing, disclosing, copying using or disseminating any of this information or taking any action in reliance on or regarding this information. If you have received this fax in error, please notify us immediately by telephone so that we can arrange for its return to [place]. Phone: [phone], Toll-Free: [phone], Fax: [phone] Page: 2 of 2 Call Id: [id number] [place]. Time [name] Time) Disposition Final User 09/17/2017 11:11:38 AM See PCP When Office is Open (within 3 days) Susy Manor, RN, Cyril Mourning 09/17/2017 11:15:47 AM Home Care Yes Susy Manor, RN,  Cyril Mourning Caller Disagree/Comply Comply Caller Understands  Yes PreDisposition Go to ED Care Advice Given Per Guideline SEE PCP WITHIN 3 DAYS: * You need to be seen within 2 or 3 days. Call your doctor during regular office hours and make an appointment. An urgent care center is often the best source of care if your doctor's office is closed or you can't get an appointment. NOTE: If office will be open tomorrow, tell caller to call then, not in 3 days. HIGH BLOOD PRESSURE: * Untreated high blood pressure may cause damage to your heart, brain, kidneys, and eyes. * Treatment of high blood pressure can reduce the risk of stroke, heart attack, and heart failure. CALL BACK IF: * Weakness or numbness of the face, arm or leg on one side of the body occurs * Difficulty walking, difficulty talking, or severe headache occurs * Chest pain or difficulty breathing occurs * Your blood pressure is over 180/110 * You become worse. CARE ADVICE given per High Blood Pressure (Adult) guideline. HOME CARE: You should be able to treat this at home. HIGH BLOOD SUGAR (HYPERGLYCEMIA): * Symptoms of mild hyperglycemia: Frequent urinating (peeing), increased thirst, fatigue, blurred vision. * Definition: Fasting blood glucose over 140 mg/dL (7.5 mmol/l) or random blood glucose over 200 mg/dL (11 mmol/l). GENERAL DIABETES ADVICE: * Eat healthy: Work with your doctor or a dietician to develop healthy meal plan. * Eye exam: Get an eye exam once a year (by an ophthalmologist). * Feet: Keep your feet clean and dry; check your feet daily for sores. * Testing: Test your blood glucose - Follow your doctor's advice regarding how often. * Record-keeping: Keep a daily record of how you are feeling and the results of your tests. * Medical checkups: See your doctor regularly. DAILY BLOOD GLUCOSE GOALS: * Pre-prandial (before meal): 80-130 mg/dL (4.4-7.2 mmol/l) * Post-prandial (1-2 hours after a meal): Less than 180 mg/dL (10 mmol/l) MEASURE AND RECORD YOUR BLOOD GLUCOSE: * Measure your blood  glucose before breakfast and before going to bed. * Record the results and show them to your doctor at your next office visit. CALL BACK IF: * Blood glucose over 300 mg/dL (16.5 mmol/l) two or more times in a row * You become worse. CARE ADVICE given per Diabetes - High Blood Sugar (Adult) guideline. Comments User: Delfin Edis, RN Date/Time [name] Time): 09/17/2017 11:06:50 AM Left side abd pain, took a laxative and it's better. User: Delfin Edis, RN Date/Time [name] Time): 09/17/2017 11:08:22 AM Retook the bp and its 179/97 pulse 83

## 2017-09-25 ENCOUNTER — Ambulatory Visit: Payer: Medicare Other | Admitting: Family Medicine

## 2017-10-13 ENCOUNTER — Other Ambulatory Visit: Payer: Self-pay | Admitting: Family Medicine

## 2017-10-16 ENCOUNTER — Encounter: Payer: Self-pay | Admitting: Family Medicine

## 2017-10-16 ENCOUNTER — Ambulatory Visit (INDEPENDENT_AMBULATORY_CARE_PROVIDER_SITE_OTHER): Payer: Medicare Other | Admitting: Family Medicine

## 2017-10-16 VITALS — BP 135/67 | HR 76 | Temp 97.6°F | Ht 63.0 in | Wt 117.8 lb

## 2017-10-16 DIAGNOSIS — L821 Other seborrheic keratosis: Secondary | ICD-10-CM

## 2017-10-16 DIAGNOSIS — I10 Essential (primary) hypertension: Secondary | ICD-10-CM

## 2017-10-16 DIAGNOSIS — E119 Type 2 diabetes mellitus without complications: Secondary | ICD-10-CM

## 2017-10-16 NOTE — Patient Instructions (Signed)
Skin lesions on back are all okay.. No cancer.  Continue current BP meds.

## 2017-10-16 NOTE — Assessment & Plan Note (Signed)
Pt reassured.

## 2017-10-16 NOTE — Assessment & Plan Note (Signed)
Well controlled. Continue current medication.  

## 2017-10-16 NOTE — Assessment & Plan Note (Signed)
Good control on current regimen. 

## 2017-10-16 NOTE — Progress Notes (Signed)
   Subjective:    Patient ID: [name], female    DOB: 03-28-1923, 84 y.o.   MRN: [id number]  HPI    84 year old female presents for 1 month follow up HTN.   She reports  Improved BP control on current regimen. At home 127-140/ 69-85 BP Readings from Last 3 Encounters:  10/16/17 135/67  09/14/17 140/66  09/13/17 (!) 141/81   CBG fasting 104-114.. One measurement 122.    Area on back itching off and on.. Daughter saw dark lesion. No bleeding.  Social History /Family History/Past Medical History reviewed in detail and updated in EMR if needed. Blood pressure 135/67, pulse 76, temperature 97.6 F (36.4 C), temperature source Oral, height 5\' 3"  (1.6 m), weight 117 lb 12 oz (53.4 kg).    Review of Systems  Constitutional: Positive for fatigue. Negative for fever.  HENT: Negative for congestion.   Eyes: Negative for pain.  Respiratory: Negative for cough and shortness of breath.   Cardiovascular: Negative for chest pain, palpitations and leg swelling.  Gastrointestinal: Negative for abdominal pain.  Genitourinary: Negative for dysuria and vaginal bleeding.  Musculoskeletal: Negative for back pain.  Neurological: Negative for syncope, light-headedness and headaches.  Psychiatric/Behavioral: Negative for dysphoric mood.       Objective:   Physical Exam  Constitutional: Vital signs are normal. She appears well-developed and well-nourished. She is cooperative.  Non-toxic appearance. She does not appear ill. No distress.  Elderly female in NAD  HENT:  Head: Normocephalic.  Right Ear: Hearing, tympanic membrane, external ear and ear canal normal. Tympanic membrane is not erythematous, not retracted and not bulging.  Left Ear: Hearing, tympanic membrane, external ear and ear canal normal. Tympanic membrane is not erythematous, not retracted and not bulging.  Nose: No mucosal edema or rhinorrhea. Right sinus exhibits no maxillary sinus tenderness and no frontal sinus  tenderness. Left sinus exhibits no maxillary sinus tenderness and no frontal sinus tenderness.  Mouth/Throat: Uvula is midline, oropharynx is clear and moist and mucous membranes are normal.  Eyes: Pupils are equal, round, and reactive to light. Conjunctivae, EOM and lids are normal. Lids are everted and swept, no foreign bodies found.  Neck: Trachea normal and normal range of motion. Neck supple. Carotid bruit is not present. No thyroid mass and no thyromegaly present.  Cardiovascular: Normal rate, regular rhythm, S1 normal, S2 normal, normal heart sounds, intact distal pulses and normal pulses. Exam reveals no gallop and no friction rub.  No murmur heard. Pulmonary/Chest: Effort normal and breath sounds normal. No tachypnea. No respiratory distress. She has no decreased breath sounds. She has no wheezes. She has no rhonchi. She has no rales.  Abdominal: Soft. Normal appearance and bowel sounds are normal. There is no tenderness.  Neurological: She is alert.  Skin: Skin is warm, dry and intact. No rash noted.  multiple sks on back  Psychiatric: Her speech is normal and behavior is normal. Judgment and thought content normal. Her mood appears not anxious. Cognition and memory are normal. She does not exhibit a depressed mood.          Assessment & Plan:

## 2017-10-25 ENCOUNTER — Other Ambulatory Visit: Payer: Self-pay | Admitting: Family Medicine

## 2017-10-30 ENCOUNTER — Ambulatory Visit (INDEPENDENT_AMBULATORY_CARE_PROVIDER_SITE_OTHER): Payer: Medicare Other | Admitting: Family Medicine

## 2017-10-30 ENCOUNTER — Encounter: Payer: Self-pay | Admitting: Family Medicine

## 2017-10-30 VITALS — BP 130/80 | HR 81 | Temp 97.5°F | Ht 63.0 in | Wt 117.5 lb

## 2017-10-30 DIAGNOSIS — R300 Dysuria: Secondary | ICD-10-CM | POA: Diagnosis not present

## 2017-10-30 LAB — POC URINALSYSI DIPSTICK (AUTOMATED)
BILIRUBIN UA: NEGATIVE
Glucose, UA: NEGATIVE
KETONES UA: NEGATIVE
Nitrite, UA: NEGATIVE
Protein, UA: NEGATIVE
Spec Grav, UA: >=1.03 — AB (ref 1.010–1.025)
Urobilinogen, UA: 0.2 E.U./dL
pH, UA: 6 (ref 5.0–8.0)

## 2017-10-30 MED ORDER — CEPHALEXIN 500 MG PO CAPS: 500.0000 mg | ORAL_CAPSULE | Freq: Two times a day (BID) | ORAL | 0 refills | Status: DC

## 2017-10-30 NOTE — Progress Notes (Signed)
Dysuria: yes, pain and frequency prev but some better at time of OV duration of symptoms: 2-3 days  abdominal pain:no fevers:no back pain: minimal back pain but she attributed that to arthritis.   Vomiting:no She had pinkish urine this AM.    Meds, vitals, and allergies reviewed.  Per HPI unless specifically indicated in ROS section   GEN: nad, alert and oriented HEENT: mucous membranes moist NECK: supple CV: rrr.  PULM: ctab, no inc wob ABD: soft, +bs, suprapubic area not tender EXT: no edema SKIN: well perfused.   BACK: no CVA pain  U/a d/w pt.

## 2017-10-30 NOTE — Patient Instructions (Signed)
Drink plenty of water and start the antibiotics today.  We'll contact you with your lab report.  Take care.   

## 2017-10-31 ENCOUNTER — Ambulatory Visit: Payer: Self-pay

## 2017-10-31 DIAGNOSIS — R300 Dysuria: Secondary | ICD-10-CM | POA: Insufficient documentation

## 2017-10-31 MED ORDER — SULFAMETHOXAZOLE-TRIMETHOPRIM 400-80 MG PO TABS: 1.0000 | ORAL_TABLET | Freq: Two times a day (BID) | ORAL | 0 refills | Status: DC

## 2017-10-31 NOTE — Telephone Encounter (Signed)
I spoke with pt;pt voiced understanding. Pt said it could be that she is having diarrhea because she took Miralax on 10/30/17. Pt has not had any diarrhea since this morning. Pt will try continuing the Keflex and wait to see if any more diarrhea.

## 2017-10-31 NOTE — Assessment & Plan Note (Signed)
Presumed cystitis.  Nontoxic.  Okay for outpatient follow-up.  Check urine culture.  Start Septra.  Update me as needed.  Routine cautions given.  She agrees.

## 2017-10-31 NOTE — Telephone Encounter (Signed)
It would unexpected for patient to have diarrhea from keflex that early in the course.  I wonder if this was incidental or not related to the abx.  The other options are at least as likely to cause diarrhea.   I would still try at least one more dose of keflex, since her ucx isn't back yet.  If she can't tolerate that and has more diarrhea, then okay to change to septra.  rx sent for that, for patient to hold in the meantime.  I hope she feels better and please give my condolences about having to go to the funeral.   Thanks.

## 2017-10-31 NOTE — Telephone Encounter (Signed)
Patient called and says "I was in the office yesterday and was given an antibiotic for UTI. I took one dose last night and was fine. This morning, I took a dose and 20 minutes later, I almost didn't make it to the bathroom, I had diarrhea. It was one time. My stomach wasn't upset. I ate breakfast before I took the pill. I am planning on going out of town tomorrow for a funeral and I was wondering if there is something else I can take that won't cause diarrhea or should I just not take it until I get back." I spoke to [place], [facility] who advised to send this over to the office, Dr. Damita Dunnings and Dr. Diona Browner are both out of the office today.  I advised the patient I will send this over to the office and she will receive a call back with a provider's recommendation, she verbalized understanding. Her CB # [phone].  Reason for Disposition . Caller has URGENT medication question about med that PCP prescribed and triager unable to answer question  Answer Assessment - Initial Assessment Questions 1. SYMPTOMS: "Do you have any symptoms?"     Yes, diarrhea 2. SEVERITY: If symptoms are present, ask "Are they mild, moderate or severe?"     Mild-Moderate  Protocols used: MEDICATION QUESTION CALL-A-AH

## 2017-10-31 NOTE — Telephone Encounter (Signed)
Thanks Agree 

## 2017-11-01 ENCOUNTER — Ambulatory Visit: Payer: Medicare Other | Admitting: Podiatry

## 2017-11-01 LAB — URINE CULTURE
MICRO NUMBER:: 90811302
SPECIMEN QUALITY:: ADEQUATE

## 2017-11-12 DIAGNOSIS — E119 Type 2 diabetes mellitus without complications: Secondary | ICD-10-CM | POA: Diagnosis not present

## 2017-11-12 DIAGNOSIS — H35033 Hypertensive retinopathy, bilateral: Secondary | ICD-10-CM | POA: Diagnosis not present

## 2017-11-12 DIAGNOSIS — Z961 Presence of intraocular lens: Secondary | ICD-10-CM | POA: Diagnosis not present

## 2017-11-12 DIAGNOSIS — H04123 Dry eye syndrome of bilateral lacrimal glands: Secondary | ICD-10-CM | POA: Diagnosis not present

## 2017-11-12 LAB — HM DIABETES EYE EXAM

## 2017-11-15 ENCOUNTER — Encounter: Payer: Self-pay | Admitting: Surgery

## 2017-11-20 ENCOUNTER — Ambulatory Visit (INDEPENDENT_AMBULATORY_CARE_PROVIDER_SITE_OTHER): Payer: Medicare Other | Admitting: Podiatry

## 2017-11-20 DIAGNOSIS — M2041 Other hammer toe(s) (acquired), right foot: Secondary | ICD-10-CM | POA: Diagnosis not present

## 2017-11-20 DIAGNOSIS — M2042 Other hammer toe(s) (acquired), left foot: Secondary | ICD-10-CM

## 2017-11-20 DIAGNOSIS — E119 Type 2 diabetes mellitus without complications: Secondary | ICD-10-CM

## 2017-11-20 DIAGNOSIS — B351 Tinea unguium: Secondary | ICD-10-CM | POA: Diagnosis not present

## 2017-11-20 DIAGNOSIS — M79676 Pain in unspecified toe(s): Secondary | ICD-10-CM

## 2017-11-20 DIAGNOSIS — E1149 Type 2 diabetes mellitus with other diabetic neurological complication: Secondary | ICD-10-CM

## 2017-11-20 DIAGNOSIS — R2681 Unsteadiness on feet: Secondary | ICD-10-CM | POA: Diagnosis not present

## 2017-11-21 NOTE — Progress Notes (Signed)
Subjective: 84 y.o. returns the office today for painful, elongated, thickened toenails which she cannot trim himself. Denies any redness or drainage around the nails.  She is asked if there is nothing we can do to help with her walking.  She states that she is extremely slow and walking.  Denies any acute changes since last appointment and no new complaints today. Denies any systemic complaints such as fevers, chills, nausea, vomiting.   Objective: AAO 3, NAD DP/PT pulses palpable, CRT less than 3 seconds Sensation decreased with Simms on the monofilament Nails hypertrophic, dystrophic, elongated, brittle, discolored 10. There is tenderness overlying the nails 1-5 bilaterally. There is no surrounding erythema or drainage along the nail sites. No open lesions or pre-ulcerative lesions are identified. No interdigital maceration.  Hammertoes present. No pain with calf compression, swelling, warmth, erythema.  Assessment: 84 year old female presents for symptomatic onychomycosis; gait instability  Plan: -Treatment options including alternatives, risks, complications were discussed -Nails sharply debrided 10 without complication/bleeding. -Discussed physical therapy at home.  She does not think about this on this note.  We also discussed that she may benefit from [facility] balance brace and diabetic shoes.  Paperwork completed today for diabetic shoe precertification this was given to [employer] and will have her scheduled to see Betha.  -Discussed daily foot inspection. If there are any changes, to call the office immediately.  -Follow-up in 3 months or sooner if any problems are to arise. In the meantime, encouraged to call the office with any questions, concerns, changes symptoms.  Celesta Gentile, DPM

## 2017-12-07 ENCOUNTER — Ambulatory Visit (INDEPENDENT_AMBULATORY_CARE_PROVIDER_SITE_OTHER): Payer: Medicare Other | Admitting: Family Medicine

## 2017-12-07 ENCOUNTER — Encounter: Payer: Self-pay | Admitting: Family Medicine

## 2017-12-07 DIAGNOSIS — K5909 Other constipation: Secondary | ICD-10-CM

## 2017-12-07 NOTE — Patient Instructions (Addendum)
Can take a few more days of miralax.. But stop if stool is loosening.  Restart Citrucel but maybe take every other day.

## 2017-12-07 NOTE — Progress Notes (Signed)
   Subjective:    Patient ID: [name], female    DOB: 03-21-1923, 84 y.o.   MRN: [id number]  HPI    84 year old female with hx of constiaption, DM, HTN hx of TIA presents with new onset left side pain in left upper abdomen.  She took Miralax x 3 days.. Had a large BM on yesterday AM. No blood in stool.  Feels  A lot better but still has mild pain.  No fever, no CP, no SOB. No emesis. Occ burping. No dysuria.   Symptoms from [place] [facility] in 10/2016 Resolved.   Blood pressure 135/84, pulse 69, temperature (!) 97.4 F (36.3 C), temperature source Oral, height 5\' 3"  (1.6 m), weight 118 lb 12 oz (53.9 kg).'Social History /Family History/Past Medical History reviewed in detail and updated in EMR if needed.  Review of Systems  Constitutional: Negative for fatigue and fever.  HENT: Negative for ear pain.   Eyes: Negative for pain.  Respiratory: Negative for chest tightness and shortness of breath.   Cardiovascular: Negative for chest pain, palpitations and leg swelling.  Gastrointestinal: Negative for abdominal pain.  Genitourinary: Negative for dysuria.       Objective:   Physical Exam  Constitutional: Vital signs are normal. She appears well-developed and well-nourished. She is cooperative.  Non-toxic appearance. She does not appear ill. No distress.  Elderly female in NAD  HENT:  Head: Normocephalic.  Right Ear: Hearing, tympanic membrane, external ear and ear canal normal. Tympanic membrane is not erythematous, not retracted and not bulging.  Left Ear: Hearing, tympanic membrane, external ear and ear canal normal. Tympanic membrane is not erythematous, not retracted and not bulging.  Nose: No mucosal edema or rhinorrhea. Right sinus exhibits no maxillary sinus tenderness and no frontal sinus tenderness. Left sinus exhibits no maxillary sinus tenderness and no frontal sinus tenderness.  Mouth/Throat: Uvula is midline, oropharynx is clear and moist and mucous membranes are normal.    Eyes: Pupils are equal, round, and reactive to light. Conjunctivae, EOM and lids are normal. Lids are everted and swept, no foreign bodies found.  Neck: Trachea normal and normal range of motion. Neck supple. Carotid bruit is not present. No thyroid mass and no thyromegaly present.  Cardiovascular: Normal rate, regular rhythm, S1 normal, S2 normal, normal heart sounds, intact distal pulses and normal pulses. Exam reveals no gallop and no friction rub.  No murmur heard. Pulmonary/Chest: Effort normal and breath sounds normal. No tachypnea. No respiratory distress. She has no decreased breath sounds. She has no wheezes. She has no rhonchi. She has no rales.  Abdominal: Soft. Normal appearance and bowel sounds are normal. There is no tenderness.  Mildly distended abdomen  Neurological: She is alert.  Skin: Skin is warm, dry and intact. No rash noted.  Psychiatric: Her speech is normal and behavior is normal. Judgment and thought content normal. Her mood appears not anxious. Cognition and memory are normal. She does not exhibit a depressed mood.          Assessment & Plan:

## 2017-12-14 ENCOUNTER — Other Ambulatory Visit: Payer: Self-pay | Admitting: Family Medicine

## 2017-12-14 NOTE — Telephone Encounter (Signed)
Copied from [place] [id number]. Topic: Quick Communication - Rx Refill/Question >> Dec 14, 2017  9:38 AM Oliver Pila B wrote: Medication: metFORMIN (GLUCOPHAGE) 500 MG tablet [[id number]]  DISCONTINUED   Pt states she needs to get back on this medication b/c her blood sugar is running higher than normal, contact pt to advise

## 2017-12-14 NOTE — Telephone Encounter (Signed)
Unable to reach pt by phone; left v/m to find out what FBS is. Left CRM that [facility] could receive info.

## 2017-12-14 NOTE — Telephone Encounter (Signed)
Patient called to report her FBS  Wednesday 8/21-120 Thursday 8/22-122 Friday 8/33-129  Patient believes that if she gets back on the medication her blood sugar will be fine.  However, she will need a new script for this medication.  Because she has been off of it for some time.  Please advise with patient. CB # [phone] ([phone])

## 2017-12-14 NOTE — Telephone Encounter (Addendum)
Last office visit 12/07/2017. Not on current medication list.  States medication was discontinued 11/14/2016.  Refill?

## 2017-12-14 NOTE — Telephone Encounter (Signed)
I will restart the medication but she should stop it again if FBS < 80 on the medication or any glucose < 60.

## 2017-12-14 NOTE — Telephone Encounter (Signed)
Ms. Vendetti notified as instructed by telephone.  She states understanding.

## 2017-12-15 DIAGNOSIS — Z23 Encounter for immunization: Secondary | ICD-10-CM | POA: Diagnosis not present

## 2017-12-18 ENCOUNTER — Ambulatory Visit: Payer: Medicare Other | Admitting: Orthotics

## 2017-12-18 DIAGNOSIS — M2041 Other hammer toe(s) (acquired), right foot: Secondary | ICD-10-CM

## 2017-12-18 DIAGNOSIS — M2042 Other hammer toe(s) (acquired), left foot: Secondary | ICD-10-CM

## 2017-12-18 DIAGNOSIS — E119 Type 2 diabetes mellitus without complications: Secondary | ICD-10-CM

## 2017-12-18 DIAGNOSIS — R2681 Unsteadiness on feet: Secondary | ICD-10-CM

## 2017-12-18 DIAGNOSIS — E1149 Type 2 diabetes mellitus with other diabetic neurological complication: Secondary | ICD-10-CM

## 2017-12-25 NOTE — Progress Notes (Signed)

## 2017-12-26 ENCOUNTER — Ambulatory Visit: Payer: Medicare Other | Admitting: Orthotics

## 2017-12-26 DIAGNOSIS — M2041 Other hammer toe(s) (acquired), right foot: Secondary | ICD-10-CM

## 2017-12-26 DIAGNOSIS — E1149 Type 2 diabetes mellitus with other diabetic neurological complication: Secondary | ICD-10-CM

## 2017-12-26 DIAGNOSIS — R2681 Unsteadiness on feet: Secondary | ICD-10-CM

## 2017-12-26 DIAGNOSIS — M2042 Other hammer toe(s) (acquired), left foot: Secondary | ICD-10-CM

## 2017-12-26 DIAGNOSIS — E119 Type 2 diabetes mellitus without complications: Secondary | ICD-10-CM

## 2017-12-31 ENCOUNTER — Telehealth: Payer: Self-pay | Admitting: *Deleted

## 2017-12-31 NOTE — Telephone Encounter (Signed)
Information has been submitted to pts insurance for verification of benefits. Awaiting response for coverage  

## 2018-01-02 NOTE — Assessment & Plan Note (Addendum)
  Pain most liklye due to constipation given relieved with large BM.  No red flags.  Continue water,  Restart fiber and  Continue miralax unless starting to have diarrhea.

## 2018-01-07 ENCOUNTER — Telehealth: Payer: Self-pay | Admitting: Family Medicine

## 2018-01-07 ENCOUNTER — Ambulatory Visit (INDEPENDENT_AMBULATORY_CARE_PROVIDER_SITE_OTHER): Payer: Medicare Other | Admitting: Family Medicine

## 2018-01-07 ENCOUNTER — Encounter: Payer: Self-pay | Admitting: Family Medicine

## 2018-01-07 ENCOUNTER — Ambulatory Visit: Payer: Self-pay | Admitting: Family Medicine

## 2018-01-07 VITALS — BP 126/72 | HR 82 | Temp 97.8°F | Ht 63.0 in | Wt 116.0 lb

## 2018-01-07 DIAGNOSIS — R1084 Generalized abdominal pain: Secondary | ICD-10-CM

## 2018-01-07 DIAGNOSIS — R109 Unspecified abdominal pain: Secondary | ICD-10-CM | POA: Diagnosis not present

## 2018-01-07 LAB — URINALYSIS, MICROSCOPIC ONLY

## 2018-01-07 LAB — POC URINALSYSI DIPSTICK (AUTOMATED)
BILIRUBIN UA: NEGATIVE
GLUCOSE UA: NEGATIVE
Ketones, UA: NEGATIVE
LEUKOCYTES UA: NEGATIVE
NITRITE UA: NEGATIVE
Protein, UA: NEGATIVE
Spec Grav, UA: 1.02 (ref 1.010–1.025)
Urobilinogen, UA: 0.2 E.U./dL
pH, UA: 6 (ref 5.0–8.0)

## 2018-01-07 MED ORDER — AMOXICILLIN-POT CLAVULANATE 875-125 MG PO TABS: 1.0000 | ORAL_TABLET | Freq: Two times a day (BID) | ORAL | 0 refills | Status: DC

## 2018-01-07 MED ORDER — MELOXICAM 7.5 MG PO TABS: 7.5000 mg | ORAL_TABLET | Freq: Every day | ORAL | Status: DC | PRN

## 2018-01-07 NOTE — Patient Instructions (Addendum)
Stop the metformin for now.  Avoid high fiber foods today.  Drink plenty of fluids and try soup/broth.  When you feel better, gradually go back to a regular diet.  If you have more urinary troubles or abdominal pain, then start augmentin and update [place].  Keep using miralax as needed.  Take care.  Glad to see you.  We'll contact you with your lab report.

## 2018-01-07 NOTE — Telephone Encounter (Signed)
Urine micro neg so the blood on the u/a was a false positive.   ucx pending.  Regardless of the labs, if more urinary troubles or abdominal pain, then start augmentin and update [place].  Thanks.

## 2018-01-07 NOTE — Telephone Encounter (Signed)
Patient advised.

## 2018-01-07 NOTE — Telephone Encounter (Signed)
Patient is calling to report that she has had abdominal pain for 2 days now. Patient states that her pain was bilateral yesterday- but now has settled to the left side. She has taken the Nexium and Miralax yesterday and she reports she did have a bowel movement yesterday. She feels pressure to burp and feels that she is having reflux symptoms. She is having a hard time eating and she is not feeling good. Patient reports a headache with eye pain. Patient reports her fasting glucose today is 107. She does reports that she is trying to eat and drink. Patient states she is not feeling well and wants to be seen- appointment made. Reason for Disposition . [1] MILD pain (e.g., does not interfere with normal activities) AND [2] pain comes and goes (cramps) AND [3] present > 48 hours  Answer Assessment - Initial Assessment Questions 1. LOCATION: "Where does it hurt?"      L upper quadrant pain 2. RADIATION: "Does the pain shoot anywhere else?" (e.g., chest, back)     no 3. ONSET: "When did the pain begin?" (e.g., minutes, hours or days ago)      [place] Mylar- it was on right- also 4. SUDDEN: "Gradual or sudden onset?"     Acid reflux yesterday- medication did help some- Nexium /miralax helped  5. PATTERN "Does the pain come and go, or is it constant?"    - If constant: "Is it getting better, staying the same, or worsening?"      (Note: Constant means the pain never goes away completely; most serious pain is constant and it progresses)     - If intermittent: "How long does it last?" "Do you have pain now?"     (Note: Intermittent means the pain goes away completely between bouts)     Patient thought it had pretty much gone-- but it has come back today 6. SEVERITY: "How bad is the pain?"  (e.g., Scale 1-10; mild, moderate, or severe)   - MILD (1-3): doesn't interfere with normal activities, abdomen soft and not tender to touch    - MODERATE (4-7): interferes with normal activities or awakens from sleep,  tender to touch    - SEVERE (8-10): excruciating pain, doubled over, unable to do any normal activities      2-3 7. RECURRENT SYMPTOM: "Have you ever had this type of abdominal pain before?" If so, ask: "When was the last time?" and "What happened that time?"      Yes- 2-3 weeks ago- Miralax was prescribed per patient 8. CAUSE: "What do you think is causing the abdominal pain?"     Patient thinks it my be reflux.Last BM- yesterday 9. RELIEVING/AGGRAVATING FACTORS: "What makes it better or worse?" (e.g., movement, antacids, bowel movement)     Nexium and Mira lax was taken yesterday 10. OTHER SYMPTOMS: "Has there been any vomiting, diarrhea, constipation, or urine problems?"       Headache- eyes hurt,patient felt pressure like she needed to burp 11. PREGNANCY: "Is there any chance you are pregnant?" "When was your last menstrual period?"       n/a  Protocols used: ABDOMINAL PAIN - [facility] [place]

## 2018-01-07 NOTE — Telephone Encounter (Signed)
Copied from [place] [id number]. Topic: Quick Communication - Lab Results >> Jan 07, 2018  3:25 PM Sheran Luz wrote: Pt calling to check status of lab results. Please advise.

## 2018-01-07 NOTE — Progress Notes (Signed)
Abd pain.  No recent meloxicam. She is taking metformin daily.  She had restarted it last month when her sugars were slightly elevated to ~120s, now ~90-100.   Has been on PPI.  She has been taking miralax.    No burning with urination. Some urinary urgency.    Abd pain started yesterday.  Yesterday with BLQ abd pain, now with minimal LLQ discomfort but not pain likely earlier this AM.  She had some taste changes with food tasting sour.  Burping more often.  No blood in stool.  No vomiting.  No fevers.  Some nausea yesterday and this AM but not now.  abd felt bloated yesterday.  Had a BM this AM.    She clearly feels better now than she did earlier this AM.    Meds, vitals, and allergies reviewed.   ROS: Per HPI unless specifically indicated in ROS section   nad ncat Mmm Neck supple, no LA rrr ctab abd soft, not ttp except LUQ minimally tender w/o rebound, not the LLQ.  R side of abd not ttp.  No rebound.  No masses noted.

## 2018-01-08 LAB — URINE CULTURE
MICRO NUMBER:: 91107718
SPECIMEN QUALITY:: ADEQUATE

## 2018-01-09 ENCOUNTER — Telehealth: Payer: Self-pay | Admitting: *Deleted

## 2018-01-09 DIAGNOSIS — R109 Unspecified abdominal pain: Secondary | ICD-10-CM | POA: Insufficient documentation

## 2018-01-09 NOTE — Telephone Encounter (Signed)
[name] notified by telephone that her urine culture was negative.  Patient states she fills a lot better.  She states Dr. Damita Dunnings told her on Monday to hold her Metformin to see if that may be what is causing her abdominal pain.  She states her FBS this morning was 134 mg/dl so she is wondering if she should restart the Metformin.  Please advise.

## 2018-01-09 NOTE — Assessment & Plan Note (Signed)
Nontoxic.  Okay for outpatient follow-up.  Discussed differential diagnosis which includes the following items. Metformin side effect.  Constipation.  Diverticulitis UTI.   GERD.   Stop the metformin for now.  She will see if that will make a difference in her symptoms.  She had a bowel movement this morning so constipation is not likely.  She has a history of diverticulosis.  She does not have left lower quadrant pain.  If she did have progressive left lower quadrant pain that I would start Augmentin and have her update [place].  Advised her to go on a bland low fiber diet in the meantime.  She can advance her diet as tolerated.  Check urine culture and microscopy.  If she has progressive urinary symptoms then start Augmentin.  She could have GERD component, continue omeprazole.  See notes on labs.  She agrees with plan.

## 2018-01-09 NOTE — Telephone Encounter (Signed)
Copied from [place] [id number]. Topic: General - Other >> Jan 09, 2018  4:36 PM Mcneil, Ja-Kwan wrote: Reason for CRM: Pt called in for an update on the urine sample. Pt requests a call back. Cb# [phone]

## 2018-01-10 ENCOUNTER — Encounter: Payer: Self-pay | Admitting: *Deleted

## 2018-01-10 NOTE — Telephone Encounter (Signed)
Left message for [name] to continuing holding the metformin if it made her abdominal pain better.  Advised Dr. Diona Browner is okay with morning blood sugars up to 150 mg/dl.   I ask that she call us back if she finds that her morning blood sugars start running >150 mg/dl.

## 2018-01-10 NOTE — Telephone Encounter (Signed)
Okay to hold off on metfrmin for now if it made abdominla pain better. We will tolerate AM blood sugars up to 150.

## 2018-01-11 ENCOUNTER — Telehealth: Payer: Self-pay | Admitting: *Deleted

## 2018-01-11 NOTE — Telephone Encounter (Signed)
[name] notified as instructed by telephone.  She states she did take the Metformin today since her BS was >150 mg/dl but she will not take anymore.  She will continue to follow her FBS and call us back toward the end of next week if her readings are remaining >150 mg/dl.

## 2018-01-11 NOTE — Telephone Encounter (Signed)
Let pt know  To work on low carb diet.. If FBS staying > 150 over the next week and stomach feeling better off the metformin we will likely need to add an new DM medication.

## 2018-01-11 NOTE — Telephone Encounter (Signed)
Copied from [place] [id number]. Topic: General - Other >> Jan 11, 2018 12:09 PM [name] wrote: Reason for CRM: Patient called in wanting to advise the staff that her blood sugar this morning was 153. Says she was instructed to call back today to report this.   Please advise

## 2018-01-14 ENCOUNTER — Ambulatory Visit (INDEPENDENT_AMBULATORY_CARE_PROVIDER_SITE_OTHER): Payer: Medicare Other | Admitting: Orthotics

## 2018-01-14 DIAGNOSIS — E119 Type 2 diabetes mellitus without complications: Secondary | ICD-10-CM

## 2018-01-14 DIAGNOSIS — M2042 Other hammer toe(s) (acquired), left foot: Secondary | ICD-10-CM

## 2018-01-14 DIAGNOSIS — R2681 Unsteadiness on feet: Secondary | ICD-10-CM | POA: Diagnosis not present

## 2018-01-14 DIAGNOSIS — M2041 Other hammer toe(s) (acquired), right foot: Secondary | ICD-10-CM

## 2018-01-14 NOTE — Progress Notes (Signed)

## 2018-01-18 ENCOUNTER — Ambulatory Visit: Payer: Self-pay

## 2018-01-18 NOTE — Telephone Encounter (Signed)
Pt saw Dr Damita Dunnings on 01/07/18 and that was when Augmentin was prescribed. FYI to Dr Damita Dunnings.

## 2018-01-18 NOTE — Telephone Encounter (Signed)
Noted. Thanks.  Have her f/u with PCP prn, if she wasn't already advised as such.

## 2018-01-18 NOTE — Telephone Encounter (Signed)
Pt. Called to go over her u/a and Urine culture results. States she is feeling better. Did not take the Augmentin. Still having some urgency, "but that is normal for me." No more abdominal pain.

## 2018-01-22 NOTE — Telephone Encounter (Signed)
Verification of benefits have been processed and an approval has been received for pts prolia injection. Pts estimated cost are appx $0. This is only an estimate and cannot be confirmed until benefits are paid. Please advise pt and schedule if needed. If scheduled, once the injection is received, pls contact me back with the date it was received so that I am able to update prolia folder. thanks   Spoke to pt and advised. Lab and prolia injection scheduled.

## 2018-01-23 ENCOUNTER — Other Ambulatory Visit: Payer: Medicare Other | Admitting: Orthotics

## 2018-01-23 DIAGNOSIS — R2681 Unsteadiness on feet: Secondary | ICD-10-CM

## 2018-01-23 DIAGNOSIS — E1149 Type 2 diabetes mellitus with other diabetic neurological complication: Secondary | ICD-10-CM | POA: Diagnosis not present

## 2018-01-28 ENCOUNTER — Other Ambulatory Visit (INDEPENDENT_AMBULATORY_CARE_PROVIDER_SITE_OTHER): Payer: Medicare Other

## 2018-01-28 DIAGNOSIS — M810 Age-related osteoporosis without current pathological fracture: Secondary | ICD-10-CM | POA: Diagnosis not present

## 2018-01-28 LAB — CALCIUM: CALCIUM: 9.7 mg/dL (ref 8.4–10.5)

## 2018-01-29 ENCOUNTER — Other Ambulatory Visit: Payer: Medicare Other | Admitting: Orthotics

## 2018-02-05 ENCOUNTER — Telehealth: Payer: Self-pay | Admitting: *Deleted

## 2018-02-05 ENCOUNTER — Ambulatory Visit (INDEPENDENT_AMBULATORY_CARE_PROVIDER_SITE_OTHER): Payer: Medicare Other | Admitting: Family Medicine

## 2018-02-05 ENCOUNTER — Encounter: Payer: Self-pay | Admitting: Family Medicine

## 2018-02-05 VITALS — BP 130/72 | HR 80 | Temp 98.2°F | Ht 63.0 in | Wt 118.5 lb

## 2018-02-05 DIAGNOSIS — E119 Type 2 diabetes mellitus without complications: Secondary | ICD-10-CM | POA: Diagnosis not present

## 2018-02-05 DIAGNOSIS — J029 Acute pharyngitis, unspecified: Secondary | ICD-10-CM

## 2018-02-05 NOTE — Patient Instructions (Signed)
Can add Xyzal at bedtime for allergies.  Continue flonase 2 sprays per nostril daily.  Limit meloxicam and avoid acidic foods.

## 2018-02-05 NOTE — Progress Notes (Signed)
   Subjective:    Patient ID: [name], female    DOB: [birth year], 84 y.o.   MRN: [id number]  Sore Throat   This is a new problem. The current episode started more than 1 year ago (noted again in last 2 days, but has been coming and going or months.). The problem has been waxing and waning. Neither side of throat is experiencing more pain than the other. There has been no fever. Associated symptoms include congestion. Pertinent negatives include no coughing, ear discharge, ear pain, plugged ear sensation, shortness of breath or trouble swallowing. She has had no exposure to strep or mono.   She does have more after [place] dressing with vinegar.  No heartburn unless eating pepper.  Using prilosec  40 mg daily.   Blood sugars doing well despite off metformin. FBS 90-105   Using flonase for allergies.   Review of Systems  HENT: Positive for congestion. Negative for ear discharge, ear pain and trouble swallowing.   Respiratory: Negative for cough and shortness of breath.        Objective:   Physical Exam  Constitutional: Vital signs are normal. She appears well-developed and well-nourished. She is cooperative.  Non-toxic appearance. She does not appear ill. No distress.  HENT:  Head: Normocephalic.  Right Ear: Hearing, tympanic membrane, external ear and ear canal normal. Tympanic membrane is not erythematous, not retracted and not bulging.  Left Ear: Hearing, tympanic membrane, external ear and ear canal normal. Tympanic membrane is not erythematous, not retracted and not bulging.  Nose: Mucosal edema and rhinorrhea present. Right sinus exhibits no maxillary sinus tenderness and no frontal sinus tenderness. Left sinus exhibits no maxillary sinus tenderness and no frontal sinus tenderness.  Mouth/Throat: Uvula is midline, oropharynx is clear and moist and mucous membranes are normal.  Eyes: Pupils are equal, round, and reactive to light. Conjunctivae, EOM and lids are normal. Lids  are everted and swept, no foreign bodies found.  Neck: Trachea normal and normal range of motion. Neck supple. Carotid bruit is not present. No thyroid mass and no thyromegaly present.  Cardiovascular: Normal rate, regular rhythm, S1 normal, S2 normal, normal heart sounds, intact distal pulses and normal pulses. Exam reveals no gallop and no friction rub.  No murmur heard. Pulmonary/Chest: Effort normal and breath sounds normal. No tachypnea. No respiratory distress. She has no decreased breath sounds. She has no wheezes. She has no rhonchi. She has no rales.  Neurological: She is alert.  Skin: Skin is warm, dry and intact. No rash noted.  Psychiatric: Her speech is normal and behavior is normal. Judgment normal. Her mood appears not anxious. Cognition and memory are normal. She does not exhibit a depressed mood.          Assessment & Plan:

## 2018-02-05 NOTE — Telephone Encounter (Signed)
[name] notified as instructed by telephone.   °

## 2018-02-05 NOTE — Assessment & Plan Note (Signed)
Likely due to allergies vs GER given intermittant.  Continue flonase and add Xyzal.. Also avoid acidic foods an continue prilosec.

## 2018-02-05 NOTE — Telephone Encounter (Signed)
[name] wants to know does she need to continue getting the Prolia injections at her age.  Please advise.

## 2018-02-05 NOTE — Telephone Encounter (Signed)
Given she is fairly healthy overall and at great risk for fall and fracture.. I would recommend it. If hear health status changes or is she just wants to minimize medications overall.. We can discuss it at her next wellness.

## 2018-02-05 NOTE — Assessment & Plan Note (Signed)
Good control of FBS off metformin.

## 2018-02-13 ENCOUNTER — Ambulatory Visit: Payer: Medicare Other

## 2018-02-13 ENCOUNTER — Telehealth: Payer: Self-pay

## 2018-02-13 ENCOUNTER — Ambulatory Visit (INDEPENDENT_AMBULATORY_CARE_PROVIDER_SITE_OTHER): Payer: Medicare Other

## 2018-02-13 VITALS — BP 110/60 | HR 82 | Temp 98.1°F | Ht 63.0 in | Wt 118.0 lb

## 2018-02-13 DIAGNOSIS — M1A00X0 Idiopathic chronic gout, unspecified site, without tophus (tophi): Secondary | ICD-10-CM | POA: Diagnosis not present

## 2018-02-13 DIAGNOSIS — I10 Essential (primary) hypertension: Secondary | ICD-10-CM

## 2018-02-13 DIAGNOSIS — M810 Age-related osteoporosis without current pathological fracture: Secondary | ICD-10-CM

## 2018-02-13 DIAGNOSIS — E119 Type 2 diabetes mellitus without complications: Secondary | ICD-10-CM

## 2018-02-13 DIAGNOSIS — Z0000 Encounter for general adult medical examination without abnormal findings: Secondary | ICD-10-CM

## 2018-02-13 LAB — CBC WITH DIFFERENTIAL/PLATELET
BASOS PCT: 0.3 % (ref 0.0–3.0)
Basophils Absolute: 0 10*3/uL (ref 0.0–0.1)
EOS PCT: 1 % (ref 0.0–5.0)
Eosinophils Absolute: 0.1 10*3/uL (ref 0.0–0.7)
HEMATOCRIT: 39.4 % (ref 36.0–46.0)
Hemoglobin: 13 g/dL (ref 12.0–15.0)
LYMPHS ABS: 1.6 10*3/uL (ref 0.7–4.0)
LYMPHS PCT: 16.3 % (ref 12.0–46.0)
MCHC: 32.9 g/dL (ref 30.0–36.0)
MCV: 94.9 fl (ref 78.0–100.0)
MONOS PCT: 7.2 % (ref 3.0–12.0)
Monocytes Absolute: 0.7 10*3/uL (ref 0.1–1.0)
NEUTROS ABS: 7.2 10*3/uL (ref 1.4–7.7)
NEUTROS PCT: 75.2 % (ref 43.0–77.0)
Platelets: 255 10*3/uL (ref 150.0–400.0)
RBC: 4.16 Mil/uL (ref 3.87–5.11)
RDW: 14.2 % (ref 11.5–15.5)
WBC: 9.6 10*3/uL (ref 4.0–10.5)

## 2018-02-13 LAB — URIC ACID: Uric Acid, Serum: 5.3 mg/dL (ref 2.4–7.0)

## 2018-02-13 LAB — COMPREHENSIVE METABOLIC PANEL
ALBUMIN: 4 g/dL (ref 3.5–5.2)
ALT: 15 U/L (ref 0–35)
AST: 20 U/L (ref 0–37)
Alkaline Phosphatase: 45 U/L (ref 39–117)
BUN: 27 mg/dL — ABNORMAL HIGH (ref 6–23)
CHLORIDE: 106 meq/L (ref 96–112)
CO2: 27 meq/L (ref 19–32)
Calcium: 9.8 mg/dL (ref 8.4–10.5)
Creatinine, Ser: 1.08 mg/dL (ref 0.40–1.20)
GFR: 50.31 mL/min — AB (ref >60.00)
Glucose, Bld: 122 mg/dL — ABNORMAL HIGH (ref 70–99)
POTASSIUM: 4 meq/L (ref 3.5–5.1)
SODIUM: 139 meq/L (ref 135–145)
TOTAL PROTEIN: 6.9 g/dL (ref 6.0–8.3)
Total Bilirubin: 0.5 mg/dL (ref 0.2–1.2)

## 2018-02-13 LAB — VITAMIN D 25 HYDROXY (VIT D DEFICIENCY, FRACTURES): VITD: 35.87 ng/mL (ref 30.00–100.00)

## 2018-02-13 LAB — HEMOGLOBIN A1C: Hgb A1c MFr Bld: 5.7 % (ref 4.6–6.5)

## 2018-02-13 MED ORDER — DENOSUMAB 60 MG/ML ~~LOC~~ SOSY
60.0000 mg | PREFILLED_SYRINGE | Freq: Once | SUBCUTANEOUS | Status: AC
  Administered 2018-02-13: 60 mg via SUBCUTANEOUS

## 2018-02-13 NOTE — Patient Instructions (Signed)
Ms. Dildine , Thank you for taking time to come for your Medicare Wellness Visit. I appreciate your ongoing commitment to your health goals. Please review the following plan we discussed and let me know if I can assist you in the future.   These are the goals we discussed: Goals    . Increase water intake     Starting 02/13/2018 I will attempt to drink at least 6-8 glasses of water daily.        This is a list of the screening recommended for you and due dates:  Health Maintenance  Topic Date Due  . Complete foot exam   02/20/2018  . Hemoglobin A1C  08/15/2018  . Eye exam for diabetics  11/13/2018  . Tetanus Vaccine  10/30/2019  . Flu Shot  Completed  . DEXA scan (bone density measurement)  Completed  . Pneumonia vaccines  Completed   Preventive Care for Adults  A healthy lifestyle and preventive care can promote health and wellness. Preventive health guidelines for adults include the following key practices.  . A routine yearly physical is a good way to check with your health care provider about your health and preventive screening. It is a chance to share any concerns and updates on your health and to receive a thorough exam.  . Visit your dentist for a routine exam and preventive care every 6 months. Brush your teeth twice a day and floss once a day. Good oral hygiene prevents tooth decay and gum disease.  . The frequency of eye exams is based on your age, health, family medical history, use  of contact lenses, and other factors. Follow your health care provider's recommendations for frequency of eye exams.  . Eat a healthy diet. Foods like vegetables, fruits, whole grains, low-fat dairy products, and lean protein foods contain the nutrients you need without too many calories. Decrease your intake of foods high in solid fats, added sugars, and salt. Eat the right amount of calories for you. Get information about a proper diet from your health care provider, if necessary.  .  Regular physical exercise is one of the most important things you can do for your health. Most adults should get at least 150 minutes of moderate-intensity exercise (any activity that increases your heart rate and causes you to sweat) each week. In addition, most adults need muscle-strengthening exercises on 2 or more days a week.  Silver Sneakers may be a benefit available to you. To determine eligibility, you may visit the website: www.silversneakers.com or contact program at 540-251-3290 Mon-Fri between 8AM-8PM.   . Maintain a healthy weight. The body mass index (BMI) is a screening tool to identify possible weight problems. It provides an estimate of body fat based on height and weight. Your health care provider can find your BMI and can help you achieve or maintain a healthy weight.   For adults 20 years and older: ? A BMI below 18.5 is considered underweight. ? A BMI of 18.5 to 24.9 is normal. ? A BMI of 25 to 29.9 is considered overweight. ? A BMI of 30 and above is considered obese.   . Maintain normal blood lipids and cholesterol levels by exercising and minimizing your intake of saturated fat. Eat a balanced diet with plenty of fruit and vegetables. Blood tests for lipids and cholesterol should begin at age 21 and be repeated every 5 years. If your lipid or cholesterol levels are high, you are over 50, or you are at high risk  for heart disease, you may need your cholesterol levels checked more frequently. Ongoing high lipid and cholesterol levels should be treated with medicines if diet and exercise are not working.  . If you smoke, find out from your health care provider how to quit. If you do not use tobacco, please do not start.  . If you choose to drink alcohol, please do not consume more than 2 drinks per day. One drink is considered to be 12 ounces (355 mL) of beer, 5 ounces (148 mL) of wine, or 1.5 ounces (44 mL) of liquor.  . If you are 34-42 years old, ask your health care  provider if you should take aspirin to prevent strokes.  . Use sunscreen. Apply sunscreen liberally and repeatedly throughout the day. You should seek shade when your shadow is shorter than you. Protect yourself by wearing long sleeves, pants, a wide-brimmed hat, and sunglasses year round, whenever you are outdoors.  . Once a month, do a whole body skin exam, using a mirror to look at the skin on your back. Tell your health care provider of new moles, moles that have irregular borders, moles that are larger than a pencil eraser, or moles that have changed in shape or color.

## 2018-02-13 NOTE — Progress Notes (Signed)
Subjective:   [name] is a 84 y.o. female who presents for Medicare Annual (Subsequent) preventive examination.  Review of Systems:  N/A Cardiac Risk Factors include: advanced age (>38mn, >>73women);dyslipidemia     Objective:     Vitals: BP 110/60 (BP Location: Right Arm, Patient Position: Sitting, Cuff Size: Normal)   Pulse 82   Temp 98.1 F (36.7 C) (Oral)   Ht 5' 3"  (1.6 m) Comment: shoes  Wt 118 lb (53.5 kg)   SpO2 97%   BMI 20.90 kg/m   Body mass index is 20.9 kg/m.  Advanced Directives 02/13/2018 07/19/2017 02/12/2017 10/07/2016 03/28/2016 02/10/2016 08/26/2014  Does Patient Have a Medical Advance Directive? Yes No Yes No No Yes Yes  Type of AParamed[place]of [place] will - [place] will - - - Living will  Does patient want to make changes to medical advance directive? - - - - - No - Patient declined No - Patient declined  Copy of [place] Chart? No - copy requested - No - copy requested - - No - copy requested No - copy requested  Would patient like information on creating a medical advance directive? - Yes (ED - Information included in AVS) - No - Patient declined - - -    Tobacco Social History   Tobacco Use  Smoking Status Former Smoker  Smokeless Tobacco Never Used  Tobacco Comment   quit 1970     Counseling given: No Comment: quit 1970   Clinical Intake:  Pre-visit preparation completed: Yes  Pain : No/denies pain Pain Score: 0-No pain     Nutritional Status: BMI of 19-24  Normal Nutritional Risks: None Diabetes: No  How often do you need to have someone help you when you read instructions, pamphlets, or other written materials from your doctor or pharmacy?: 1 - Never What is the last grade level you completed in school?: Bachelor degree  Interpreter Needed?: No  Comments: pt is a widow and lives alone Information entered by :: LPinson, LPN  Past Medical  History:  Diagnosis Date  . Acute pharyngitis   . Acute sinusitis, unspecified   . Acute upper respiratory infections of unspecified site   . Allergy   . Anal and rectal polyp   . Cataract    History of  . Cavernous angioma   . Degenerative cervical disc 02/2006  . Diabetes mellitus    Type II  . GERD (gastroesophageal reflux disease)   . Gout   . Hemangioma of unspecified site   . Hyperlipidemia   . Hypertension   . Orthostatic hypotension 04/2006   Hospital  . Osteoarthritis   . Osteopenia   . Other abscess of vulva   . Palpitations   . Syncope and collapse    1 episode   Past Surgical History:  Procedure Laterality Date  . ABDOMINAL HYSTERECTOMY    . Aortic dopplers     Mild plaque.  EEG okay  . CATARACT EXTRACTION    . Cavernous angioma    . Cavernous hemangioma  04/2004   Hospital  . TOTAL KNEE ARTHROPLASTY     Right   Family History  Problem Relation Age of Onset  . Heart disease Other        CHF  . Cancer Maternal Aunt        Breast CA   Social History   Socioeconomic History  . Marital status: Married    Spouse  name: Not on file  . Number of children: 3  . Years of education: Not on file  . Highest education level: Not on file  Occupational History  . 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 Financial resource strain: Not on file  . Food insecurity:    Worry: Not on file    Inability: Not on file  . Transportation needs:    Medical: Not on file    Non-medical: Not on file  Tobacco Use  . Smoking status: Former Research scientist (life sciences)  . Smokeless tobacco: Never Used  . Tobacco comment: quit 1970  Substance and Sexual Activity  . Alcohol use: No    Alcohol/week: 0.0 standard drinks  . Drug use: No  . Sexual activity: Not on file  Lifestyle  . Physical activity:    Days per week: Not on file    Minutes per session: Not on file  . Stress: Not on file  Relationships  . Social connections:     Talks on phone: Not on file    Gets together: Not on file    Attends religious service: Not on file    Active member of club or organization: Not on file    Attends meetings of clubs or organizations: Not on file    Relationship status: Not on file  Other Topics Concern  . Not on file  Social History Narrative   Diet: fruit and veggies, water, lean protein.   Activity limited secondary to knee discomfort/arthritis, but going to church exersice group two times a week.              Outpatient Encounter Medications as of 02/13/2018  Medication Sig  . ACCU-CHEK AVIVA PLUS test strip USE TO CHECK BLOOD SUGAR DAILY AS NEEDED. DX: E11.9  . acetaminophen (TYLENOL) 325 MG tablet Take 650 mg by mouth 2 (two) times daily as needed.   Marland Kitchen aspirin EC 81 MG tablet Take 40.5 mg by mouth at bedtime.   . Blood Glucose Monitoring Suppl (ACCU-CHEK AVIVA PLUS) W/DEVICE KIT Use to check blood sugar daily as needed.  Dx: E11.9  . fish oil-omega-3 fatty acids 1000 MG capsule Take 1 g by mouth daily.    . fluticasone (FLONASE) 50 MCG/ACT nasal spray PLACE 2 SPRAYS INTO BOTH NOSTRILS DAILY.  Marland Kitchen Lancets (FREESTYLE) lancets CHECK BLOOD SUGAR 1-2 TIMES A DAY. DX: E11.9 NON INSULIN DEPENDENT  . Multiple Vitamin (MULTIVITAMIN WITH MINERALS) TABS tablet Take 1 tablet by mouth daily.  Marland Kitchen omeprazole (PRILOSEC) 40 MG capsule Take 1 capsule (40 mg total) by mouth as needed.  Vladimir Faster Glycol-Propyl Glycol (SYSTANE OP) Place 1 drop into both eyes daily as needed (for dry eyes).   . polyethylene glycol (MIRALAX / GLYCOLAX) packet Take 17 g by mouth daily.  . ramipril (ALTACE) 5 MG capsule TAKE 1 CAPSULE (5 MG TOTAL) BY MOUTH DAILY.  . [EXPIRED] denosumab (PROLIA) injection 60 mg    No facility-administered encounter medications on file as of 02/13/2018.     Activities of Daily Living In your present state of health, do you have any difficulty performing the following activities: 02/13/2018  Hearing? Y  Vision? Y    Difficulty concentrating or making decisions? Y  Walking or climbing stairs? Y  Dressing or bathing? N  Doing errands, shopping? N  Preparing Food and eating ? N  Using the Toilet? N  In the past six months, have you accidently leaked  urine? Y  Do you have problems with loss of bowel control? Y  Managing your Medications? N  Managing your Finances? N  Housekeeping or managing your Housekeeping? N  Some recent data might be hidden    Patient Care Team: [name], MD as PCP - General    Assessment:   This is a routine wellness examination for [facility].   Hearing Screening   125Hz  250Hz  500Hz  1000Hz  2000Hz  3000Hz  4000Hz  6000Hz  8000Hz   Right ear:   0 0 0  0    Left ear:   0 0 0  0    Vision Screening Comments: Vision exam in 2019 with Dr. Herbert Deaner    Exercise Activities and Dietary recommendations Current Exercise Habits: The patient does not participate in regular exercise at present, Exercise limited by: None identified  Goals    . Increase water intake     Starting 02/13/2018 I will attempt to drink at least 6-8 glasses of water daily.        Fall Risk Fall Risk  02/13/2018 02/12/2017 02/10/2016 01/15/2015 10/07/2013  Falls in the past year? Yes Yes Yes Yes No  Comment pt fell while walking into church; denies injury tripped while walking into church; lost balance when entering door at home - - -  Number falls in past yr: 1 2 or more 2 or more 2 or more -  Injury with Fall? No Yes Yes No -  Risk for fall due to : History of fall(s);Impaired balance/gait - - - -   Depression Screen PHQ 2/9 Scores 02/13/2018 02/12/2017 02/10/2016 01/15/2015  PHQ - 2 Score 2 3 0 0  PHQ- 9 Score 2 6 - -     Cognitive Function MMSE - Mini Mental State Exam 02/13/2018 02/12/2017  Orientation to time 5 5  Orientation to Place 5 5  Registration 3 3  Attention/ Calculation 0 0  Recall 3 3  Language- name 2 objects 0 0  Language- repeat 1 1  Language- follow 3 step command 3 3   Language- read & follow direction 0 0  Write a sentence 0 0  Copy design 0 0  Total score 20 20     PLEASE NOTE: A Mini-Cog screen was completed. Maximum score is 20. A value of 0 denotes this part of Folstein MMSE was not completed or the patient failed this part of the Mini-Cog screening.   Mini-Cog Screening Orientation to Time - Max 5 pts Orientation to Place - Max 5 pts Registration - Max 3 pts Recall - Max 3 pts Language Repeat - Max 1 pts Language Follow 3 Step Command - Max 3 pts     Immunization History  Administered Date(s) Administered  . Influenza Split 01/16/2012  . Influenza Whole 01/27/2003, 01/25/2007, 02/14/2011  . Influenza, High Dose Seasonal PF 01/14/2014, 01/18/2016, 01/29/2017, 12/14/2017  . Influenza,inj,Quad PF,6+ Mos 01/19/2015  . Pneumococcal Conjugate-13 10/07/2013  . Pneumococcal Polysaccharide-23 02/22/1994  . Td 04/24/1997, 10/29/2009  . Zoster 02/28/2008    Screening Tests Health Maintenance  Topic Date Due  . FOOT EXAM  02/20/2018  . HEMOGLOBIN A1C  08/15/2018  . OPHTHALMOLOGY EXAM  11/13/2018  . TETANUS/TDAP  10/30/2019  . INFLUENZA VACCINE  Completed  . DEXA SCAN  Completed  . PNA vac Low Risk Adult  Completed     Plan:     I have personally reviewed, addressed, and noted the following in the patient's chart:  A. Medical and social history B. Use of alcohol,  tobacco or illicit drugs  C. Current medications and supplements D. Functional ability and status E.  Nutritional status F.  Physical activity G. Advance directives H. List of other physicians I.  Hospitalizations, surgeries, and ER visits in previous 12 months J.  [place] to include hearing, vision, cognitive, depression L. Referrals and appointments - none  In addition, I have reviewed and discussed with patient certain preventive protocols, quality metrics, and best practice recommendations. A written personalized care plan for preventive services as well  as general preventive health recommendations were provided to patient.  See attached scanned questionnaire for additional information.   Signed,   Lindell Noe, MHA, BS, LPN Health Coach

## 2018-02-13 NOTE — Progress Notes (Signed)
PCP notes:   Health maintenance:  A1C - completed  Abnormal screenings:   Hearing - failed (Note: pt cannot afford hearing aids at this time)  Hearing Screening   125Hz  250Hz  500Hz  1000Hz  2000Hz  3000Hz  4000Hz  6000Hz  8000Hz   Right ear:   0 0 0  0    Left ear:   0 0 0  0    Vision Screening Comments: Vision exam in 2019 with Dr. Herbert Deaner  Fall risk - hx of single fall Fall Risk  02/13/2018 02/12/2017 02/10/2016 01/15/2015 10/07/2013  Falls in the past year? Yes Yes Yes Yes No  Comment pt fell while walking into church; denies injury tripped while walking into church; lost balance when entering door at home - - -  Number falls in past yr: 1 2 or more 2 or more 2 or more -  Injury with Fall? No Yes Yes No -  Risk for fall due to : History of fall(s);Impaired balance/gait - - - -    Depression score: 2 Depression screen [facility] 2/9 02/13/2018 02/12/2017 02/10/2016 01/15/2015 10/07/2013  Decreased Interest 0 0 0 0 -  Down, Depressed, Hopeless 2 3 0 0 0  PHQ - 2 Score 2 3 0 0 0  Altered sleeping 0 1 - - -  Tired, decreased energy 0 1 - - -  Change in appetite 0 0 - - -  Feeling bad or failure about yourself  0 1 - - -  Trouble concentrating 0 0 - - -  Moving slowly or fidgety/restless 0 0 - - -  Suicidal thoughts 0 0 - - -  PHQ-9 Score 2 6 - - -  Difficult doing work/chores Not difficult at all Not difficult at all - - -  Some recent data might be hidden    Patient concerns:   None  Nurse concerns:  Prolia injection administered as ordered.   Next PCP appt:   02/26/18 @ 1115

## 2018-02-13 NOTE — Telephone Encounter (Signed)
Prolia injection administered as ordered.

## 2018-02-14 ENCOUNTER — Encounter: Payer: Self-pay | Admitting: Family Medicine

## 2018-02-14 ENCOUNTER — Ambulatory Visit (INDEPENDENT_AMBULATORY_CARE_PROVIDER_SITE_OTHER): Payer: Medicare Other | Admitting: Family Medicine

## 2018-02-14 ENCOUNTER — Ambulatory Visit: Payer: Medicare Other

## 2018-02-14 VITALS — BP 122/70 | HR 82 | Temp 97.6°F | Ht 63.0 in | Wt 116.8 lb

## 2018-02-14 DIAGNOSIS — J069 Acute upper respiratory infection, unspecified: Secondary | ICD-10-CM | POA: Insufficient documentation

## 2018-02-14 NOTE — Progress Notes (Signed)
Subjective:    Patient ID: [name], female    DOB: April 12, 1923, 84 y.o.   MRN: [id number]  HPI  Here for sinus symptoms   Throat hurts and nasal symptoms  Took xyzal - made her sleepy   No fever   No chills or aches   Eyes water  Throat hurts - not bad to swallow  Thick mucous that she has to spit out  Grey to clear in color   Stuffy nose (a little runny)  Some pressure around eyes  Ears feel ok   No cough   Patient Active Problem List   Diagnosis Date Noted  . Viral URI 02/14/2018  . Sore throat 02/05/2018  . Abdominal pain 01/09/2018  . Dysuria 10/31/2017  . Seborrheic keratoses 10/16/2017  . Hyponatremia 09/14/2017  . Dizziness 09/11/2017  . Left upper quadrant pain 08/07/2017  . Right hip pain 07/12/2017  . Hearing loss 02/20/2017  . TIA (transient ischemic attack), possible 11/14/2016  . Left carpal tunnel syndrome 08/11/2016  . Fatigue 06/15/2016  . Constipation, chronic 03/21/2016  . General weakness 01/11/2016  . Near syncope 05/07/2015  . Osteoarthritis of right shoulder 05/04/2015  . Counseling regarding end of life decision making 01/15/2015  . Skin lesion of right leg 07/10/2014  . Insomnia 04/10/2014  . Osteoarthritis of right knee 10/14/2013  . Osteoarthritis of left knee 10/14/2013  . Gout, chronic, without tophus 12/13/2009  . Osteoporosis 10/29/2009  . PALPITATIONS, OCCASIONAL 07/07/2008  . Allergic rhinitis 06/02/2008  . HEMANGIOMA 07/30/2006  . Diabetes mellitus type 2, controlled, without complications ([place]) 56/38/9373  . Hyperlipidemia LDL goal <100 07/30/2006  . Essential hypertension, benign 07/30/2006  . GERD 07/30/2006  . RECTAL POLYPS 07/30/2006  . OSTEOARTHRITIS 07/30/2006  . CATARACT, HX OF 07/30/2006   Past Medical History:  Diagnosis Date  . Acute pharyngitis   . Acute sinusitis, unspecified   . Acute upper respiratory infections of unspecified site   . Allergy   . Anal and rectal polyp   . Cataract    History of  . Cavernous angioma   . Degenerative cervical disc 02/2006  . Diabetes mellitus    Type II  . GERD (gastroesophageal reflux disease)   . Gout   . Hemangioma of unspecified site   . Hyperlipidemia   . Hypertension   . Orthostatic hypotension 04/2006   Hospital  . Osteoarthritis   . Osteopenia   . Other abscess of vulva   . Palpitations   . Syncope and collapse    1 episode   Past Surgical History:  Procedure Laterality Date  . ABDOMINAL HYSTERECTOMY    . Aortic dopplers     Mild plaque.  EEG okay  . CATARACT EXTRACTION    . Cavernous angioma    . Cavernous hemangioma  04/2004   Hospital  . TOTAL KNEE ARTHROPLASTY     Right   Social History   Tobacco Use  . Smoking status: Former Research scientist (life sciences)  . Smokeless tobacco: Never Used  . Tobacco comment: quit 1970  Substance Use Topics  . Alcohol use: No    Alcohol/week: 0.0 standard drinks  . Drug use: No   Family History  Problem Relation Age of Onset  . Heart disease Other        CHF  . Cancer Maternal Aunt        Breast CA   Allergies  Allergen Reactions  . Codeine Nausea And Vomiting  . Statins Other (See Comments)  Pt does not remember reaction   Current Outpatient Medications on File Prior to Visit  Medication Sig Dispense Refill  . ACCU-CHEK AVIVA PLUS test strip USE TO CHECK BLOOD SUGAR DAILY AS NEEDED. DX: E11.9 100 each 3  . acetaminophen (TYLENOL) 325 MG tablet Take 650 mg by mouth 2 (two) times daily as needed.     Marland Kitchen aspirin EC 81 MG tablet Take 40.5 mg by mouth at bedtime.     . Blood Glucose Monitoring Suppl (ACCU-CHEK AVIVA PLUS) W/DEVICE KIT Use to check blood sugar daily as needed.  Dx: E11.9 1 kit 0  . fish oil-omega-3 fatty acids 1000 MG capsule Take 1 g by mouth daily.      . fluticasone (FLONASE) 50 MCG/ACT nasal spray PLACE 2 SPRAYS INTO BOTH NOSTRILS DAILY. 16 g 3  . Lancets (FREESTYLE) lancets CHECK BLOOD SUGAR 1-2 TIMES A DAY. DX: E11.9 NON INSULIN DEPENDENT 100 each 3  .  levocetirizine (XYZAL) 5 MG tablet Take 5 mg by mouth every evening.    . Multiple Vitamin (MULTIVITAMIN WITH MINERALS) TABS tablet Take 1 tablet by mouth daily.    Marland Kitchen omeprazole (PRILOSEC) 40 MG capsule Take 1 capsule (40 mg total) by mouth as needed. 90 capsule 1  . Polyethyl Glycol-Propyl Glycol (SYSTANE OP) Place 1 drop into both eyes daily as needed (for dry eyes).     . polyethylene glycol (MIRALAX / GLYCOLAX) packet Take 17 g by mouth daily.    . ramipril (ALTACE) 5 MG capsule TAKE 1 CAPSULE (5 MG TOTAL) BY MOUTH DAILY. 90 capsule 1   No current facility-administered medications on file prior to visit.      Review of Systems  Constitutional: Positive for appetite change and fatigue. Negative for fever.  HENT: Positive for congestion, postnasal drip, rhinorrhea, sinus pressure, sneezing and sore throat. Negative for ear pain and voice change.   Eyes: Negative for pain and discharge.  Respiratory: Negative for cough, shortness of breath, wheezing and stridor.        Clearing throat  Feels like she could start coughing soon   Cardiovascular: Negative for chest pain.  Gastrointestinal: Negative for diarrhea, nausea and vomiting.  Genitourinary: Negative for frequency, hematuria and urgency.  Musculoskeletal: Positive for arthralgias. Negative for myalgias.       Arthritis pain   Skin: Negative for rash.  Neurological: Positive for headaches. Negative for dizziness, weakness and light-headedness.  Psychiatric/Behavioral: Negative for confusion and dysphoric mood.       Objective:   Physical Exam  Constitutional: She appears well-developed and well-nourished. No distress.  Frail appearing elderly female   HENT:  Head: Normocephalic and atraumatic.  Right Ear: External ear normal.  Left Ear: External ear normal.  Mouth/Throat: Oropharynx is clear and moist.  Nares are injected and congested  No sinus tenderness Clear rhinorrhea and post nasal drip   Eyes: Pupils are equal,  round, and reactive to light. Conjunctivae and EOM are normal. Right eye exhibits no discharge. Left eye exhibits no discharge. No scleral icterus.  Neck: Normal range of motion. Neck supple.  Cardiovascular: Normal rate, regular rhythm and normal heart sounds.  Pulmonary/Chest: Effort normal and breath sounds normal. No respiratory distress. She has no wheezes. She has no rales. She exhibits no tenderness.  Good air exch No rales or rhonchi occ dry cough  Lymphadenopathy:    She has no cervical adenopathy.  Neurological: She is alert.  Skin: Skin is warm and dry. No rash noted.  Psychiatric: She  has a normal mood and affect.          Assessment & Plan:   Problem List Items Addressed This Visit      Respiratory   Viral URI - Primary    Reassuring exam  Disc sympt care-fluids/rest /antihistamine (try claritin)/ flonase/nasal saline May also try expectorant (plain mucinex) otc prn  Update if not starting to improve in a week or if worsening  (esp if cough or fever)  Handout given  inst her not to visit immunocomp family member until she feels better

## 2018-02-14 NOTE — Patient Instructions (Addendum)
You have a cold (viral upper respiratory infection)  Drink fluids  Get rest when you can  Put off visiting your son until you feel better   For drip and runny nose - try claritin instead of xyzal- it may be less sedate  Continue tylenol for pain or sore throat or fever  For congestion - plain mucinex helps (also over the counter)  Saline (salt water) nasal spray for congestion as well   Update if not starting to improve in a week or if worsening

## 2018-02-14 NOTE — Assessment & Plan Note (Signed)
Reassuring exam  Disc sympt care-fluids/rest /antihistamine (try claritin)/ flonase/nasal saline May also try expectorant (plain mucinex) otc prn  Update if not starting to improve in a week or if worsening  (esp if cough or fever)  Handout given  inst her not to visit immunocomp family member until she feels better

## 2018-02-14 NOTE — Telephone Encounter (Signed)
Noted in prolia book

## 2018-02-14 NOTE — Progress Notes (Signed)
Here for sinus symptoms   Throat hurts and nasal symptoms  Took xyzal - made her sleepy   No fever   No chills or aches   Eyes water  Throat hurts - not bad to swallow  Thick mucous that she has to spit out  Grey to clear in color   Stuffy nose (a little runny)  Some pressure around eyes  Ears feel ok   No cough

## 2018-02-20 ENCOUNTER — Ambulatory Visit: Payer: Medicare Other | Admitting: Podiatry

## 2018-02-20 NOTE — Progress Notes (Signed)
I reviewed health advisor's note, was available for consultation, and agree with documentation and plan.  

## 2018-02-24 ENCOUNTER — Other Ambulatory Visit: Payer: Self-pay | Admitting: Family Medicine

## 2018-02-24 DIAGNOSIS — E119 Type 2 diabetes mellitus without complications: Secondary | ICD-10-CM

## 2018-02-26 ENCOUNTER — Encounter: Payer: Self-pay | Admitting: Family Medicine

## 2018-02-26 ENCOUNTER — Ambulatory Visit (INDEPENDENT_AMBULATORY_CARE_PROVIDER_SITE_OTHER): Payer: Medicare Other | Admitting: Family Medicine

## 2018-02-26 VITALS — BP 120/62 | HR 70 | Temp 98.0°F | Ht 63.0 in | Wt 118.8 lb

## 2018-02-26 DIAGNOSIS — I10 Essential (primary) hypertension: Secondary | ICD-10-CM

## 2018-02-26 DIAGNOSIS — E119 Type 2 diabetes mellitus without complications: Secondary | ICD-10-CM | POA: Diagnosis not present

## 2018-02-26 DIAGNOSIS — M810 Age-related osteoporosis without current pathological fracture: Secondary | ICD-10-CM

## 2018-02-26 DIAGNOSIS — J3089 Other allergic rhinitis: Secondary | ICD-10-CM

## 2018-02-26 DIAGNOSIS — R21 Rash and other nonspecific skin eruption: Secondary | ICD-10-CM | POA: Diagnosis not present

## 2018-02-26 LAB — HM DIABETES FOOT EXAM

## 2018-02-26 NOTE — Assessment & Plan Note (Signed)
Well controlled. Continue current medication.  

## 2018-02-26 NOTE — Assessment & Plan Note (Signed)
On prolia.  Vit D nml.

## 2018-02-26 NOTE — Assessment & Plan Note (Addendum)
Great control on no med. Occ using PRN  Metformin if FBS > 150

## 2018-02-26 NOTE — Assessment & Plan Note (Signed)
Change back to claritin as SE to Xyzal.

## 2018-02-26 NOTE — Patient Instructions (Addendum)
Stop Xyzal and restart Claritin. Continue flonase for allergies.  Can apply topical OTC cortisone 10 to rash on face BID.

## 2018-02-26 NOTE — Assessment & Plan Note (Signed)
Apply topical steroid OTC

## 2018-02-26 NOTE — Progress Notes (Signed)
Subjective:    Patient ID: [name], female    DOB: March 17, 1923, 84 y.o.   MRN: [id number]  HPI The patient presents for  complete physical and review of chronic health problems. He/She also has the following acute concerns today: HAs noted rash on right cheek at mouth edge. Applying topical antibitoics. No  New exposures. Not really itchy, no pain.  The patient saw Candis Musa, LPN for medicare wellness. Note reviewed in detail and important notes copied below. Health maintenance:  A1C - completed  Abnormal screenings:   Hearing - failed (Note: pt cannot afford hearing aids at this time)             Hearing Screening   125Hz  250Hz  500Hz  1000Hz  2000Hz  3000Hz  4000Hz  6000Hz  8000Hz   Right ear:   0 0 0  0    Left ear:   0 0 0  0    Vision Screening Comments: Vision exam in 2019 with Dr. Herbert Deaner  Fall risk - hx of single fall Fall Risk  02/13/2018 02/12/2017 02/10/2016 01/15/2015 10/07/2013  Falls in the past year? NO Yes Yes Yes No  Comment  correction to [facility] visit tripped while walking into church; lost balance when entering door at home - - -  Number falls in past yr: 0 2 or more 2 or more 2 or more -  Injury with Fall? No Yes Yes No -  Risk for fall due to : History of fall(s);Impaired balance/gait - - - -    Depression score: 2 Depression screen [facility] 2/9 02/13/2018 02/12/2017 02/10/2016 01/15/2015 10/07/2013  Decreased Interest 0 0 0 0 -  Down, Depressed, Hopeless 2 3 0 0 0  PHQ - 2 Score 2 3 0 0 0  Altered sleeping 0 1 - - -  Tired, decreased energy 0 1 - - -  Change in appetite 0 0 - - -  Feeling bad or failure about yourself  0 1 - - -  Trouble concentrating 0 0 - - -  Moving slowly or fidgety/restless 0 0 - - -  Suicidal thoughts 0 0 - - -  PHQ-9 Score 2 6 - - -  Difficult doing work/chores Not difficult at all Not difficult at all - - -  Some recent data might be hidden    02/26/18 Today  Recent cold symptoms have started improving..  Still some congestion.  SE to Xyzal.  Hypertension:   At goal on ramipril  Using medication without problems or lightheadedness:  none Chest pain with exertion: none Edema: Short of breath: Average home BPs: Other issues:  Diabetes:   At goal with diet. Lab Results  Component Value Date   HGBA1C 5.7 02/13/2018  Using medications without difficulties: Hypoglycemic episodes:none Hyperglycemic episodes:none Feet problems: no ulcer Blood Sugars averaging: eye exam within last year: 10/2017   Cholesterol  Not on statin given age.  Lab Results  Component Value Date   CHOL 178 02/12/2017   HDL 56.60 02/12/2017   LDLCALC 103 (H) 02/12/2017   TRIG 91.0 02/12/2017   CHOLHDL 3 02/12/2017   HTN well controlled on ACEI   Social History /Family History/Past Medical History reviewed in detail and updated in EMR if needed. Blood pressure 120/62, pulse 70, temperature 98 F (36.7 C), temperature source Oral, height 5\' 3"  (1.6 m), weight 118 lb 12 oz (53.9 kg), SpO2 99 %.  Review of Systems  Constitutional: Negative for fatigue and fever.  HENT: Negative for congestion.   Eyes:  Negative for pain.  Respiratory: Negative for cough and shortness of breath.   Cardiovascular: Negative for chest pain, palpitations and leg swelling.  Gastrointestinal: Negative for abdominal pain.  Genitourinary: Negative for dysuria and vaginal bleeding.  Musculoskeletal: Negative for back pain.  Neurological: Negative for syncope, light-headedness and headaches.  Psychiatric/Behavioral: Negative for dysphoric mood.       Objective:   Physical Exam  Constitutional: Vital signs are normal. She appears well-developed and well-nourished. She is cooperative.  Non-toxic appearance. She does not appear ill. No distress.  HENT:  Head: Normocephalic.  Right Ear: Hearing, tympanic membrane, external ear and ear canal normal.  Left Ear: Hearing, tympanic membrane, external ear and ear canal normal.  Nose: Nose  normal.  Eyes: Pupils are equal, round, and reactive to light. Conjunctivae, EOM and lids are normal. Lids are everted and swept, no foreign bodies found.  Neck: Trachea normal and normal range of motion. Neck supple. Carotid bruit is not present. No thyroid mass and no thyromegaly present.  Cardiovascular: Normal rate, regular rhythm, S1 normal, S2 normal, normal heart sounds and intact distal pulses. Exam reveals no gallop.  No murmur heard. Pulmonary/Chest: Effort normal and breath sounds normal. No respiratory distress. She has no wheezes. She has no rhonchi. She has no rales.  Abdominal: Soft. Normal appearance and bowel sounds are normal. She exhibits no distension, no fluid wave, no abdominal bruit and no mass. There is no hepatosplenomegaly. There is no tenderness. There is no rebound, no guarding and no CVA tenderness. No hernia.  Lymphadenopathy:    She has no cervical adenopathy.    She has no axillary adenopathy.  Neurological: She is alert. She has normal strength. No cranial nerve deficit or sensory deficit.  Skin: Skin is warm, dry and intact. No rash noted.   Small erythematous patch on right upper lips. laa  Psychiatric: Her speech is normal and behavior is normal. Judgment normal. Her mood appears not anxious. Cognition and memory are normal. She does not exhibit a depressed mood.     Diabetic foot exam: Normal inspection No skin breakdown Small callus on great toe on right Normal DP pulses Normal sensation to light touch and monofilament Nails thickened      Assessment & Plan:  The patient's preventative maintenance and recommended screening tests for an annual wellness exam were reviewed in full today. Brought up to date unless services declined.  Counselled on the importance of diet, exercise, and its role in overall health and mortality. The patient's FH and SH was reviewed, including their home life, tobacco status, and drug and alcohol status.     Uptodate  with vaccine.. No additional prevention indicated at  advanced age  Due for repeat DEXA on prolia

## 2018-02-28 ENCOUNTER — Ambulatory Visit (INDEPENDENT_AMBULATORY_CARE_PROVIDER_SITE_OTHER): Payer: Medicare Other | Admitting: Podiatry

## 2018-02-28 DIAGNOSIS — M79675 Pain in left toe(s): Secondary | ICD-10-CM

## 2018-02-28 DIAGNOSIS — L84 Corns and callosities: Secondary | ICD-10-CM | POA: Diagnosis not present

## 2018-02-28 DIAGNOSIS — B351 Tinea unguium: Secondary | ICD-10-CM | POA: Diagnosis not present

## 2018-02-28 DIAGNOSIS — E1142 Type 2 diabetes mellitus with diabetic polyneuropathy: Secondary | ICD-10-CM

## 2018-02-28 DIAGNOSIS — M79674 Pain in right toe(s): Secondary | ICD-10-CM

## 2018-02-28 NOTE — Patient Instructions (Addendum)

## 2018-03-04 ENCOUNTER — Ambulatory Visit (INDEPENDENT_AMBULATORY_CARE_PROVIDER_SITE_OTHER): Payer: Medicare Other | Admitting: Internal Medicine

## 2018-03-04 ENCOUNTER — Encounter: Payer: Self-pay | Admitting: Internal Medicine

## 2018-03-04 VITALS — BP 118/76 | HR 86 | Temp 98.5°F | Resp 20 | Ht 63.0 in | Wt 117.5 lb

## 2018-03-04 DIAGNOSIS — J3089 Other allergic rhinitis: Secondary | ICD-10-CM | POA: Diagnosis not present

## 2018-03-04 NOTE — Progress Notes (Signed)
Subjective:    Patient ID: [name], female    DOB: 1922-11-27, 84 y.o.   MRN: [id number]  HPI Here due to cough  Started at least 2 weeks ago Productive of white sputum--variable No fever Seen 10/24----but still comes and goes Has taken xyzal at night---seems to help Eyes feel funny (but goes back 2 years) Known allergies---just flonase in the past  No SOB No headache or sore throat No ear pain  Current Outpatient Medications on File Prior to Visit  Medication Sig Dispense Refill  . ACCU-CHEK AVIVA PLUS test strip USE TO CHECK BLOOD SUGAR DAILY AS NEEDED. DX: E11.9 100 each 3  . acetaminophen (TYLENOL) 325 MG tablet Take 650 mg by mouth 2 (two) times daily as needed.     Marland Kitchen aspirin EC 81 MG tablet Take 40.5 mg by mouth at bedtime.     . Blood Glucose Monitoring Suppl (ACCU-CHEK AVIVA PLUS) W/DEVICE KIT Use to check blood sugar daily as needed.  Dx: E11.9 1 kit 0  . fish oil-omega-3 fatty acids 1000 MG capsule Take 1 g by mouth daily.      . fluticasone (FLONASE) 50 MCG/ACT nasal spray PLACE 2 SPRAYS INTO BOTH NOSTRILS DAILY. 16 g 3  . Lancets (FREESTYLE) lancets CHECK BLOOD SUGAR 1-2 TIMES A DAY. DX: E11.9 NON INSULIN DEPENDENT 100 each 3  . levocetirizine (XYZAL) 5 MG tablet Take 5 mg by mouth daily as needed for allergies.    Marland Kitchen loratadine (CLARITIN) 10 MG tablet Take 10 mg by mouth daily as needed for allergies.    . Multiple Vitamin (MULTIVITAMIN WITH MINERALS) TABS tablet Take 1 tablet by mouth daily.    Marland Kitchen omeprazole (PRILOSEC) 40 MG capsule Take 1 capsule (40 mg total) by mouth as needed. 90 capsule 1  . Polyethyl Glycol-Propyl Glycol (SYSTANE OP) Place 1 drop into both eyes daily as needed (for dry eyes).     . polyethylene glycol (MIRALAX / GLYCOLAX) packet Take 17 g by mouth daily as needed.     . ramipril (ALTACE) 5 MG capsule TAKE 1 CAPSULE (5 MG TOTAL) BY MOUTH DAILY. 90 capsule 1   No current facility-administered medications on file prior to visit.      Allergies  Allergen Reactions  . Codeine Nausea And Vomiting  . Statins Other (See Comments)    Pt does not remember reaction    Past Medical History:  Diagnosis Date  . Acute pharyngitis   . Acute sinusitis, unspecified   . Acute upper respiratory infections of unspecified site   . Allergy   . Anal and rectal polyp   . Cataract    History of  . Cavernous angioma   . Degenerative cervical disc 02/2006  . Diabetes mellitus    Type II  . GERD (gastroesophageal reflux disease)   . Gout   . Hemangioma of unspecified site   . Hyperlipidemia   . Hypertension   . Orthostatic hypotension 04/2006   Hospital  . Osteoarthritis   . Osteopenia   . Other abscess of vulva   . Palpitations   . Syncope and collapse    1 episode    Past Surgical History:  Procedure Laterality Date  . ABDOMINAL HYSTERECTOMY    . Aortic dopplers     Mild plaque.  EEG okay  . CATARACT EXTRACTION    . Cavernous angioma    . Cavernous hemangioma  04/2004   Hospital  . TOTAL KNEE ARTHROPLASTY     Right  Family History  Problem Relation Age of Onset  . Heart disease Other        CHF  . Cancer Maternal Aunt        Breast CA    Social History   Socioeconomic History  . Marital status: Married    Spouse name: Not on file  . Number of children: 3  . Years of education: Not on file  . Highest education level: Not on file  Occupational History  . 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 Financial resource strain: Not on file  . Food insecurity:    Worry: Not on file    Inability: Not on file  . Transportation needs:    Medical: Not on file    Non-medical: Not on file  Tobacco Use  . Smoking status: Former Research scientist (life sciences)  . Smokeless tobacco: Never Used  . Tobacco comment: quit 1970  Substance and Sexual Activity  . Alcohol use: Yes    Alcohol/week: 0.0 standard drinks    Comment: wine rarely  . Drug use: No  . Sexual  activity: Not on file  Lifestyle  . Physical activity:    Days per week: Not on file    Minutes per session: Not on file  . Stress: Not on file  Relationships  . Social connections:    Talks on phone: Not on file    Gets together: Not on file    Attends religious service: Not on file    Active member of club or organization: Not on file    Attends meetings of clubs or organizations: Not on file    Relationship status: Not on file  . Intimate partner violence:    Fear of current or ex partner: Not on file    Emotionally abused: Not on file    Physically abused: Not on file    Forced sexual activity: Not on file  Other Topics Concern  . Not on file  Social History Narrative   Diet: fruit and veggies, water, lean protein.   Activity limited secondary to knee discomfort/arthritis, but going to church exersice group two times a week.             Review of Systems  No rash No vomiting or diarrhea Appetite is okay Notices more symptoms when sleeps in one of the beds (got it second hand)     Objective:   Physical Exam  Constitutional: She appears well-developed. No distress.  HENT:  Mouth/Throat: Oropharynx is clear and moist. No oropharyngeal exudate.  No sinus tenderness Moderate pale nasal congestion TMs normal  Neck: No thyromegaly present.  Respiratory: Effort normal and breath sounds normal. No respiratory distress. She has no wheezes. She has no rales.  Lymphadenopathy:    She has no cervical adenopathy.           Assessment & Plan:

## 2018-03-04 NOTE — Assessment & Plan Note (Signed)
She notices that she feels better out of her house Doesn't seem to be an infection Has old mattress---is getting her rugs cleaned--discussed all this Continue the flonase and probably needs the xyzal regularly

## 2018-03-05 ENCOUNTER — Encounter: Payer: Medicare Other | Admitting: Family Medicine

## 2018-03-08 ENCOUNTER — Other Ambulatory Visit: Payer: Self-pay

## 2018-03-08 ENCOUNTER — Telehealth: Payer: Self-pay | Admitting: Family Medicine

## 2018-03-08 NOTE — Progress Notes (Signed)

## 2018-03-08 NOTE — Telephone Encounter (Signed)
Left message asking pt to call office Regarding bone density

## 2018-03-15 ENCOUNTER — Telehealth: Payer: Self-pay

## 2018-03-15 NOTE — Telephone Encounter (Signed)
Spoke to pt. She denied fever or SOB. She had stopped the Xyzal yesterday because she was feeling better. I told her that it was safe to take everyday. She will start back on it. She asked me to cancel her appt on Monday.

## 2018-03-15 NOTE — Telephone Encounter (Signed)
I had thought it was just allergies at that visit---so she should continue the xyzal and flonase (or comparable) Make sure no fever or SOB  Not sure if she needs to come in on Monday---only if worsening

## 2018-03-15 NOTE — Telephone Encounter (Signed)
Patient called and states she was seen on 03/04/18 by Dr Silvio Pate for same symptoms and felt better after that visit but as of yesterday, 03/14/18, started not to feel well and developed a cough last night that kept her up. Cough was productive last night with some yellow phlegm but this morning it was clear phlegm. No fever that she knows of. No body aches. She was taking Xyzal until 3 to 4 days ago and this was helping. Should she keep taking Xyzal? What can she take to help her symptoms? She is feeling fatigue/weak. She has an appointment on Monday-03/18/18- to see Dr Silvio Pate and patient wants to know if she even needs to come in. Please review. Thank you

## 2018-03-17 ENCOUNTER — Encounter: Payer: Self-pay | Admitting: Podiatry

## 2018-03-17 NOTE — Progress Notes (Signed)
Subjective: [name] presents today with painful, thick toenails 1-5 b/l that she cannot cut and which interfere with daily activities.  Pain is aggravated when wearing enclosed shoe gear.  Objective: Vascular Examination: Capillary refill time less than 3 seconds x 10 digits  Dorsalis pedis and Posterior tibial pulses palpable b/l Digital hair x 10 digits decreased Skin temperature gradient WNL b/l  Dermatological Examination: Skin thin and atrophic b/l  Toenails 1-5 b/l discolored, thick, dystrophic with subungual debris and pain with palpation to nailbeds due to thickness of nails.  Hyperkeratotic lesion plantarmedial hallux IPJ right foot and dorsomedial left 2nd digit  Musculoskeletal: Muscle strength 5/5 to all LE muscle groups Hammertoes present 2-5 b/l  Neurological: Sensation decreased with 10 gram monofilament b/l  Assessment: 1. Painful onychomycosis toenails 1-5 b/l  2. Hyperkeratoses plantarmedial hallux IPJ right foot and dorsomedial left 2nd digit 3. NIDDM with neuropathy  Plan: 1. Continue diabetic foot care principles. Literature dispensed. 2. Toenails 1-5 b/l were debrided in length and girth without iatrogenic bleeding. 3. Patient to continue soft, supportive shoe gear 4. Patient to report any pedal injuries to medical professional immediately. 5. Follow up 3 months. Patient/POA to call should there be a concern in the interim.

## 2018-03-18 ENCOUNTER — Ambulatory Visit: Payer: Medicare Other | Admitting: Internal Medicine

## 2018-04-03 ENCOUNTER — Other Ambulatory Visit: Payer: Self-pay | Admitting: Family Medicine

## 2018-04-09 ENCOUNTER — Other Ambulatory Visit: Payer: Self-pay | Admitting: Family Medicine

## 2018-04-30 ENCOUNTER — Telehealth: Payer: Self-pay | Admitting: Family Medicine

## 2018-04-30 DIAGNOSIS — E119 Type 2 diabetes mellitus without complications: Secondary | ICD-10-CM

## 2018-04-30 MED ORDER — ACCU-CHEK GUIDE W/DEVICE KIT: 1.0000 | PACK | Freq: Every day | 0 refills | Status: DC

## 2018-04-30 MED ORDER — GLUCOSE BLOOD VI STRP: ORAL_STRIP | 3 refills | Status: DC

## 2018-04-30 MED ORDER — ACCU-CHEK SOFT TOUCH LANCETS MISC: 3 refills | Status: DC

## 2018-04-30 NOTE — Telephone Encounter (Signed)
Prescriptions sent to AccuChek Guide meter, lancets and test strips to [facility] on [address].

## 2018-04-30 NOTE — Telephone Encounter (Signed)
Pt called office stating she is out of the accu-chek test strips. The pharmacy said they no longer carry those and that she needs to call Dr.Bedsole and request a Rx for the accu-chek guide test strips to [facility] on [address]. Pt is wondering if she needs a new lancet too. Please advise

## 2018-05-10 ENCOUNTER — Ambulatory Visit: Payer: Self-pay

## 2018-05-10 ENCOUNTER — Ambulatory Visit (INDEPENDENT_AMBULATORY_CARE_PROVIDER_SITE_OTHER): Payer: Medicare Other | Admitting: Family Medicine

## 2018-05-10 VITALS — BP 102/58 | HR 87 | Temp 97.4°F | Resp 16 | Ht 63.0 in | Wt 118.0 lb

## 2018-05-10 DIAGNOSIS — L700 Acne vulgaris: Secondary | ICD-10-CM

## 2018-05-10 DIAGNOSIS — R5383 Other fatigue: Secondary | ICD-10-CM | POA: Diagnosis not present

## 2018-05-10 DIAGNOSIS — R238 Other skin changes: Secondary | ICD-10-CM

## 2018-05-10 DIAGNOSIS — D72829 Elevated white blood cell count, unspecified: Secondary | ICD-10-CM

## 2018-05-10 DIAGNOSIS — R1012 Left upper quadrant pain: Secondary | ICD-10-CM | POA: Diagnosis not present

## 2018-05-10 MED ORDER — BACITRACIN 500 UNIT/GM EX OINT: 1.0000 "application " | TOPICAL_OINTMENT | Freq: Two times a day (BID) | CUTANEOUS | 0 refills | Status: DC

## 2018-05-10 NOTE — Telephone Encounter (Signed)
Pt hung up  Unable to reach after agent attempted several times. Agent will consult office of [facility]

## 2018-05-10 NOTE — Telephone Encounter (Signed)
Pt returned call.  She states that she has been feeling tired for the last couple days. Today she has pain that she rates as mild to her left abdomin. She states it is on the line of her belly button. She states today she has no appetite.  She is nauseous. She has no fever or vomiting.  She said a few days ago she took an antacid and the pain improved.  She had a normal BM today and it seemed to her to help the pain today.   She has had this pain in the past but she is unsure what it was. She does have a red bump on her back that she has had treated by Dr Diona Browner in the past. It itches today.  Per protocol pt will keep scheduled appointment today unless contacted by provider. Attempted to contact pt to give care advice but received no answer.  Reason for Disposition . [1] MILD-MODERATE pain AND [2] constant AND [3] present > 2 hours  Answer Assessment - Initial Assessment Questions 1. LOCATION: "Where does it hurt?"      Left upper side of abdomin above her belly button 2. RADIATION: "Does the pain shoot anywhere else?" (e.g., chest, back)     no 3. ONSET: "When did the pain begin?" (e.g., minutes, hours or days ago)      Tired and nauseous this AM 4. SUDDEN: "Gradual or sudden onset?"     Sudden for the pain 5. PATTERN "Does the pain come and go, or is it constant?"    - If constant: "Is it getting better, staying the same, or worsening?"      (Note: Constant means the pain never goes away completely; most serious pain is constant and it progresses)     - If intermittent: "How long does it last?" "Do you have pain now?"     (Note: Intermittent means the pain goes away completely between bouts)     Better now but always there today 6. SEVERITY: "How bad is the pain?"  (e.g., Scale 1-10; mild, moderate, or severe)   - MILD (1-3): doesn't interfere with normal activities, abdomen soft and not tender to touch    - MODERATE (4-7): interferes with normal activities or awakens from sleep, tender to  touch    - SEVERE (8-10): excruciating pain, doubled over, unable to do any normal activities      3 dose not wake her at night She is tired. She has had her appendix removed in the past 7. RECURRENT SYMPTOM: "Have you ever had this type of abdominal pain before?" If so, ask: "When was the last time?" and "What happened that time?"    Yes  But it went away 8. CAUSE: "What do you think is causing the abdominal pain?"     unsure 9. RELIEVING/AGGRAVATING FACTORS: "What makes it better or worse?" (e.g., movement, antacids, bowel movement)     Antacids help, BM helped this AM 10. OTHER SYMPTOMS: "Has there been any vomiting, diarrhea, constipation, or urine problems?"       Red lump on back rt side on back, nauseous, not appitite 11. PREGNANCY: "Is there any chance you are pregnant?" "When was your last menstrual period?"     N/A  Protocols used: ABDOMINAL PAIN - [facility]

## 2018-05-10 NOTE — Progress Notes (Signed)
Subjective:    Patient ID: [name], female    DOB: 1922-10-24, 85 y.o.   MRN: [id number]  HPI  Patient presents to clinic complaining of left-sided abdominal pain that started 3 to 4 days ago, feeling very tired.  Patient denies any nausea/vomiting or diarrhea.  Could not explain the left-sided abdominal pain.  Decided to take MiraLAX yesterday ended up having good bowel movement today, left-sided abdominal pain resolved.  Patient states she does feel tired, and is not sure exactly why.  Denies fever or chills.  Denies chest pain.  Denies shortness of breath or wheezing/cough.  Patient also complains of a bump on her back that is itchy, little red.  Is not sure exactly what bump is and would like me to look at it. Believes bump has been there for months.   Patient Active Problem List   Diagnosis Date Noted  . Viral URI 02/14/2018  . Sore throat 02/05/2018  . Abdominal pain 01/09/2018  . Seborrheic keratoses 10/16/2017  . Hyponatremia 09/14/2017  . Left upper quadrant pain 08/07/2017  . Right hip pain 07/12/2017  . Hearing loss 02/20/2017  . TIA (transient ischemic attack), possible 11/14/2016  . Left carpal tunnel syndrome 08/11/2016  . Constipation, chronic 03/21/2016  . Near syncope 05/07/2015  . Osteoarthritis of right shoulder 05/04/2015  . Counseling regarding end of life decision making 01/15/2015  . Skin lesion of right leg 07/10/2014  . Insomnia 04/10/2014  . Osteoarthritis of right knee 10/14/2013  . Osteoarthritis of left knee 10/14/2013  . Rash 03/07/2013  . Gout, chronic, without tophus 12/13/2009  . Osteoporosis 10/29/2009  . PALPITATIONS, OCCASIONAL 07/07/2008  . Allergic rhinitis 06/02/2008  . HEMANGIOMA 07/30/2006  . Diabetes mellitus type 2, controlled, without complications ([place]) 40/98/1191  . Hyperlipidemia LDL goal <100 07/30/2006  . Essential hypertension, benign 07/30/2006  . GERD 07/30/2006  . RECTAL POLYPS 07/30/2006  . OSTEOARTHRITIS  07/30/2006  . CATARACT, HX OF 07/30/2006   Social History   Tobacco Use  . Smoking status: Former Research scientist (life sciences)  . Smokeless tobacco: Never Used  . Tobacco comment: quit 1970  Substance Use Topics  . Alcohol use: Yes    Alcohol/week: 0.0 standard drinks    Comment: wine rarely   Review of Systems  Constitutional: Negative for chills, and fever. +tiredness HENT: Negative for congestion, ear pain, sinus pain and sore throat.   Eyes: Negative.   Respiratory: Negative for cough, shortness of breath and wheezing.   Cardiovascular: Negative for chest pain, palpitations and leg swelling.  Gastrointestinal: Negative for abdominal pain, diarrhea, nausea and vomiting.  Genitourinary: Negative for dysuria, frequency and urgency.  Musculoskeletal: Negative for arthralgias and myalgias.  Skin: +bump on back, some redness. Neurological: Negative for syncope, light-headedness and headaches.  Psychiatric/Behavioral: The patient is not nervous/anxious.       Objective:   Physical Exam Vitals signs and nursing note reviewed.  Constitutional:      General: She is not in acute distress.    Appearance: She is normal weight. She is not ill-appearing, toxic-appearing or diaphoretic.  HENT:     Head: Normocephalic and atraumatic.  Eyes:     General: No scleral icterus.    Extraocular Movements: Extraocular movements intact.     Conjunctiva/sclera: Conjunctivae normal.  Cardiovascular:     Rate and Rhythm: Normal rate and regular rhythm.  Pulmonary:     Effort: Pulmonary effort is normal.     Breath sounds: Normal breath sounds.  Abdominal:  General: Abdomen is flat. Bowel sounds are normal. There is no distension.     Palpations: Abdomen is soft. There is no mass.     Tenderness: There is no abdominal tenderness. There is no guarding or rebound.  Skin:    General: Skin is warm and dry.     Coloration: Skin is not jaundiced or pale.     Comments: Large black head on middle of upper back,  about size of silver dollar. Some redness on top of raised skin, could be from rubbing on shirt  Neurological:     Mental Status: She is alert and oriented to person, place, and time.     Comments: Walks with cane  Psychiatric:        Mood and Affect: Mood normal.        Behavior: Behavior normal.    Vitals:   05/10/18 1632  BP: (!) 102/58  Pulse: 87  Resp: 16  Temp: (!) 97.4 F (36.3 C)  SpO2: 92%      Assessment & Plan:   Left-sided abdominal pain (now resolved)- suspect it could have been related to patient feeling constipated, Gegick MiraLAX and did have a bowel movement now pain is resolved.  We will also get CBC and CMP to rule out any anemia or electrolyte abnormalities.   Fatigue-we will also include thyroid panel and lab work.  Blackhead/skin irritation-patient given dermatology referral.  She will apply thin layer bacitracin over red irritated area of skin surrounding blackhead.  Patient will keep regularly scheduled follow-up with PCP as planned.  Advised to return to clinic sooner if any issues arise.

## 2018-05-11 LAB — CBC WITH DIFFERENTIAL/PLATELET
Absolute Monocytes: 1025 cells/uL — ABNORMAL HIGH (ref 200–950)
BASOS ABS: 38 {cells}/uL (ref 0–200)
Basophils Relative: 0.3 %
EOS PCT: 0.7 %
Eosinophils Absolute: 88 cells/uL (ref 15–500)
HEMATOCRIT: 37.9 % (ref 35.0–45.0)
Hemoglobin: 13 g/dL (ref 11.7–15.5)
LYMPHS ABS: 2163 {cells}/uL (ref 850–3900)
MCH: 31.6 pg (ref 27.0–33.0)
MCHC: 34.3 g/dL (ref 32.0–36.0)
MCV: 92.2 fL (ref 80.0–100.0)
MONOS PCT: 8.2 %
MPV: 10 fL (ref 7.5–12.5)
NEUTROS PCT: 73.5 %
Neutro Abs: 9188 cells/uL — ABNORMAL HIGH (ref 1500–7800)
Platelets: 292 10*3/uL (ref 140–400)
RBC: 4.11 10*6/uL (ref 3.80–5.10)
RDW: 12.9 % (ref 11.0–15.0)
Total Lymphocyte: 17.3 %
WBC: 12.5 10*3/uL — ABNORMAL HIGH (ref 3.8–10.8)

## 2018-05-11 LAB — COMPREHENSIVE METABOLIC PANEL
AG RATIO: 1.4 (calc) (ref 1.0–2.5)
ALT: 14 U/L (ref 6–29)
AST: 19 U/L (ref 10–35)
Albumin: 4.1 g/dL (ref 3.6–5.1)
Alkaline phosphatase (APISO): 43 U/L (ref 33–130)
BUN / CREAT RATIO: 28 (calc) — AB (ref 6–22)
BUN: 31 mg/dL — AB (ref 7–25)
CO2: 21 mmol/L (ref 20–32)
Calcium: 9.9 mg/dL (ref 8.6–10.4)
Chloride: 103 mmol/L (ref 98–110)
Creat: 1.1 mg/dL — ABNORMAL HIGH (ref 0.60–0.88)
Globulin: 2.9 g/dL (calc) (ref 1.9–3.7)
Glucose, Bld: 110 mg/dL — ABNORMAL HIGH (ref 65–99)
Potassium: 4.9 mmol/L (ref 3.5–5.3)
SODIUM: 136 mmol/L (ref 135–146)
TOTAL PROTEIN: 7 g/dL (ref 6.1–8.1)
Total Bilirubin: 0.5 mg/dL (ref 0.2–1.2)

## 2018-05-11 LAB — THYROID PANEL WITH TSH
Free Thyroxine Index: 2.9 (ref 1.4–3.8)
T3 Uptake: 32 % (ref 22–35)
T4, Total: 9 ug/dL (ref 5.1–11.9)
TSH: 2.64 mIU/L (ref 0.40–4.50)

## 2018-05-13 ENCOUNTER — Telehealth: Payer: Self-pay

## 2018-05-13 NOTE — Telephone Encounter (Signed)
Copied from [place] [id number]. Topic: Referral - Question >> May 13, 2018 10:02 AM Lennox Solders wrote: Reason for CRM: pt is calling and would like a dermatologist in [place].

## 2018-05-13 NOTE — Telephone Encounter (Signed)
Called Pt with results she stated she understood, I also scheduled Pt for lab work on 06/03/2018 @ 10:10am.

## 2018-05-13 NOTE — Telephone Encounter (Signed)
Referral was placed at office visit

## 2018-05-13 NOTE — Telephone Encounter (Signed)
Please advise 

## 2018-05-13 NOTE — Addendum Note (Signed)
Addended by: Philis Nettle on: 05/13/2018 09:40 AM   Modules accepted: Orders

## 2018-05-15 ENCOUNTER — Telehealth: Payer: Self-pay

## 2018-05-15 NOTE — Telephone Encounter (Signed)
I put the referral in at her office visit. I can forward to our referral coordinator to see what else needs to be done. Patient wants to be seen somewhere in [place]

## 2018-05-15 NOTE — Telephone Encounter (Signed)
FYI:  Patient calling to inform Philis Nettle, who placed last referral for dermatology, that she has an appointment in [facility] with Dr Allyson Sabal.

## 2018-05-15 NOTE — Telephone Encounter (Signed)
Copied from [place]. Topic: Referral - Request for Referral >> May 15, 2018 10:00 AM Alanda Slim E wrote: Has patient seen PCP for this complaint? Yes *If NO, is insurance requiring patient see PCP for this issue before PCP can refer them? Referral for which specialty: Dermatology  Preferred provider/office: [facility] [address] [place])  Reason for referral: Pt lives in [place] and doesn't know much about [place] >> May 15, 2018 10:15 AM Yvette Rack wrote: Pt states that is it take her to get an appt in April then she rather see someone else in [place] or  See someone in [place] >> May 15, 2018 10:18 AM Yvette Rack wrote: Void the last two messages pt will call the dermatologist in [place] to see how soon she can get in

## 2018-05-15 NOTE — Telephone Encounter (Signed)
This is not a patient at our office but is requesting referral. I wasn't sure who this should be routed to.   Copied from [place]. Topic: Referral - Request for Referral >> May 15, 2018 10:00 AM [name] wrote: Has patient seen PCP for this complaint? Yes *If NO, is insurance requiring patient see PCP for this issue before PCP can refer them? Referral for which specialty: Dermatology  Preferred provider/office: [facility], [place])  Reason for referral: Pt lives in [place] and doesn't know much about [employer]

## 2018-05-15 NOTE — Telephone Encounter (Signed)
This referral was made by Philis Nettle at [facility] office.  Will forward message to her to see if anything else needs to be done.

## 2018-05-15 NOTE — Telephone Encounter (Signed)
Appear per other phone notes that pt has appt with Dr. Allyson Sabal.  I belive referral already placed. Does anything else need to be done?

## 2018-05-16 ENCOUNTER — Telehealth: Payer: Self-pay

## 2018-05-16 NOTE — Telephone Encounter (Signed)
Appt scheduled with Dr Laurence Ferrari for 05/23/2018 at 11:30am and patient called and notified. Sent message to Micheline Maze [facility] at [facility] that Appt has been made.

## 2018-05-16 NOTE — Telephone Encounter (Signed)
Referral sent to [facility].

## 2018-05-16 NOTE — Telephone Encounter (Signed)
Pt left v/m; pt saw Philis Nettle FNP on 05/10/18; dermatology referral done. Pt has appt with Dermatology on 08/01/18. Pt using Bacitracin on area on back; pts neighbor looked at area on back and thinks area looks infected, very red and request sooner appt. With dermatology. I spoke with [facility] and she will see if pt can be seen sooner.

## 2018-05-23 DIAGNOSIS — L720 Epidermal cyst: Secondary | ICD-10-CM | POA: Diagnosis not present

## 2018-05-23 DIAGNOSIS — L0291 Cutaneous abscess, unspecified: Secondary | ICD-10-CM | POA: Diagnosis not present

## 2018-05-23 DIAGNOSIS — L570 Actinic keratosis: Secondary | ICD-10-CM | POA: Diagnosis not present

## 2018-05-23 DIAGNOSIS — L821 Other seborrheic keratosis: Secondary | ICD-10-CM | POA: Diagnosis not present

## 2018-05-29 ENCOUNTER — Ambulatory Visit (INDEPENDENT_AMBULATORY_CARE_PROVIDER_SITE_OTHER): Payer: Medicare Other | Admitting: Podiatry

## 2018-05-29 DIAGNOSIS — L84 Corns and callosities: Secondary | ICD-10-CM

## 2018-05-29 DIAGNOSIS — M79674 Pain in right toe(s): Secondary | ICD-10-CM | POA: Diagnosis not present

## 2018-05-29 DIAGNOSIS — E1142 Type 2 diabetes mellitus with diabetic polyneuropathy: Secondary | ICD-10-CM | POA: Diagnosis not present

## 2018-05-29 DIAGNOSIS — M79675 Pain in left toe(s): Secondary | ICD-10-CM | POA: Diagnosis not present

## 2018-05-29 DIAGNOSIS — B351 Tinea unguium: Secondary | ICD-10-CM

## 2018-05-29 NOTE — Patient Instructions (Signed)

## 2018-05-31 ENCOUNTER — Ambulatory Visit: Payer: Medicare Other | Admitting: Family Medicine

## 2018-06-03 ENCOUNTER — Telehealth: Payer: Self-pay

## 2018-06-03 ENCOUNTER — Other Ambulatory Visit: Payer: Medicare Other

## 2018-06-03 ENCOUNTER — Encounter: Payer: Self-pay | Admitting: Podiatry

## 2018-06-03 NOTE — Telephone Encounter (Signed)
Pt lvm stating she was seen at [facility] on 05/10/18 for feeling tired and a cyst on her back. Says she was told her WBC was elevated.  Says she had a f/u scheduled with Dr. Diona Browner 05/31/18 but was r/s to 06/14/18. Says she still feels tired and not up to par.  Pt is asking if she needs to be seen before her 06/14/18 f/u.  Pls advise pt at [phone].

## 2018-06-03 NOTE — Telephone Encounter (Signed)
Appointment moved to 06/07/2018 at 12:00 pm.

## 2018-06-03 NOTE — Progress Notes (Signed)
Subjective: [name] presents with h/o diabetic neuropathy and cc of painful, discolored, thick toenails and painful calluses and corn which interfere with activities of daily living. Pain is aggravated when wearing enclosed shoe gear and  relieved with periodic professional debridement.  [name], MD is her PCP and last visit was 02/26/2018.   Current Outpatient Medications:  .  acetaminophen (TYLENOL) 325 MG tablet, Take 650 mg by mouth 2 (two) times daily as needed. , Disp: , Rfl:  .  aspirin EC 81 MG tablet, Take 40.5 mg by mouth at bedtime. , Disp: , Rfl:  .  bacitracin 500 UNIT/GM ointment, Apply 1 application topically 2 (two) times daily. Apply thin layer to red area of skin on back, Disp: 15 g, Rfl: 0 .  Blood Glucose Monitoring Suppl (ACCU-CHEK GUIDE) w/Device KIT, 1 each by Does not apply route daily. Use to check blood sugar daily as needed.  Dx: E11.9, Disp: 1 kit, Rfl: 0 .  fish oil-omega-3 fatty acids 1000 MG capsule, Take 1 g by mouth daily.  , Disp: , Rfl:  .  fluticasone (FLONASE) 50 MCG/ACT nasal spray, SPRAY 2 SPRAYS INTO EACH NOSTRIL EVERY DAY, Disp: 16 g, Rfl: 3 .  glucose blood (ACCU-CHEK GUIDE) test strip, Use to check blood sugar daily as needed.  Dx: E11.9, Disp: 100 each, Rfl: 3 .  Lancets (ACCU-CHEK SOFT TOUCH) lancets, Use to check blood sugar daily as needed.  Dx: E11.9, Disp: 100 each, Rfl: 3 .  levocetirizine (XYZAL) 5 MG tablet, Take 5 mg by mouth daily as needed for allergies., Disp: , Rfl:  .  loratadine (CLARITIN) 10 MG tablet, Take 10 mg by mouth daily as needed for allergies., Disp: , Rfl:  .  Multiple Vitamin (MULTIVITAMIN WITH MINERALS) TABS tablet, Take 1 tablet by mouth daily., Disp: , Rfl:  .  omeprazole (PRILOSEC) 40 MG capsule, Take 1 capsule (40 mg total) by mouth as needed., Disp: 90 capsule, Rfl: 1 .  Polyethyl Glycol-Propyl Glycol (SYSTANE OP), Place 1 drop into both eyes daily as needed (for dry eyes). , Disp: , Rfl:  .  polyethylene  glycol (MIRALAX / GLYCOLAX) packet, Take 17 g by mouth daily as needed. , Disp: , Rfl:  .  ramipril (ALTACE) 5 MG capsule, TAKE 1 CAPSULE (5 MG TOTAL) BY MOUTH DAILY., Disp: 90 capsule, Rfl: 1  Allergies  Allergen Reactions  . Codeine Nausea And Vomiting  . Statins Other (See Comments)    Pt does not remember reaction    Vascular Examination: Capillary refill time <3 seconds x 10 digits Dorsalis pedis and Posterior tibial pulses present b/l Decreased digital hair x 10 digits Skin temperature WNL b/l  Dermatological Examination: Skin thin and atrophic b/l  Toenails 1-5 b/l discolored, thick, dystrophic with subungual debris and pain with palpation to nailbeds due to thickness of nails.  Hyperkeratotic lesion b/l hallux and dorsomedial 2nd digit left foot. No erythema, no edema, no drainage, no flocculence to either lesion.  Musculoskeletal: Muscle strength 5/5 to all LE muscle groups  Hammertoes 2-5 b/l  Neurological: Sensation diminished with 10 gram monofilament.   Assessment: 1. Painful onychomycosis toenails 1-5 b/l 2. Callus b/l hallux 3. Corn left 2nd digit 4. NIDDM with Diabetic neuropathy 5. Hammertoes 2-5 b/l  Plan: 1. Continue diabetic foot care principles. Literature dispensed on today's visit. 2. Toenails 1-5 b/l were debrided in length and girth without iatrogenic bleeding. 3. Hyperkeratotic lesion pared with sterile chisel blade b/l hallux and left  2nd digit. 4. Patient to continue soft, supportive shoe gear 5. Patient to report any pedal injuries to medical professional  6. Follow up 3 months.  7. Patient/POA to call should there be a concern in the interim.

## 2018-06-06 DIAGNOSIS — D1800 Hemangioma unspecified site: Secondary | ICD-10-CM | POA: Diagnosis not present

## 2018-06-06 DIAGNOSIS — L570 Actinic keratosis: Secondary | ICD-10-CM | POA: Diagnosis not present

## 2018-06-06 DIAGNOSIS — Z1283 Encounter for screening for malignant neoplasm of skin: Secondary | ICD-10-CM | POA: Diagnosis not present

## 2018-06-06 DIAGNOSIS — L814 Other melanin hyperpigmentation: Secondary | ICD-10-CM | POA: Diagnosis not present

## 2018-06-06 DIAGNOSIS — L219 Seborrheic dermatitis, unspecified: Secondary | ICD-10-CM | POA: Diagnosis not present

## 2018-06-06 DIAGNOSIS — D229 Melanocytic nevi, unspecified: Secondary | ICD-10-CM | POA: Diagnosis not present

## 2018-06-06 DIAGNOSIS — L720 Epidermal cyst: Secondary | ICD-10-CM | POA: Diagnosis not present

## 2018-06-06 DIAGNOSIS — L821 Other seborrheic keratosis: Secondary | ICD-10-CM | POA: Diagnosis not present

## 2018-06-07 ENCOUNTER — Encounter: Payer: Self-pay | Admitting: Family Medicine

## 2018-06-07 ENCOUNTER — Ambulatory Visit (INDEPENDENT_AMBULATORY_CARE_PROVIDER_SITE_OTHER): Payer: Medicare Other | Admitting: Family Medicine

## 2018-06-07 VITALS — BP 150/86 | HR 74 | Temp 97.6°F | Ht 63.0 in | Wt 119.5 lb

## 2018-06-07 DIAGNOSIS — L723 Sebaceous cyst: Secondary | ICD-10-CM | POA: Diagnosis not present

## 2018-06-07 DIAGNOSIS — I10 Essential (primary) hypertension: Secondary | ICD-10-CM | POA: Diagnosis not present

## 2018-06-07 DIAGNOSIS — E119 Type 2 diabetes mellitus without complications: Secondary | ICD-10-CM

## 2018-06-07 DIAGNOSIS — L089 Local infection of the skin and subcutaneous tissue, unspecified: Secondary | ICD-10-CM

## 2018-06-07 DIAGNOSIS — D72829 Elevated white blood cell count, unspecified: Secondary | ICD-10-CM | POA: Diagnosis not present

## 2018-06-07 LAB — CBC WITH DIFFERENTIAL/PLATELET
Basophils Absolute: 0 10*3/uL (ref 0.0–0.1)
Basophils Relative: 0.5 % (ref 0.0–3.0)
Eosinophils Absolute: 0.1 10*3/uL (ref 0.0–0.7)
Eosinophils Relative: 1.3 % (ref 0.0–5.0)
HCT: 41.2 % (ref 36.0–46.0)
Hemoglobin: 13.7 g/dL (ref 12.0–15.0)
LYMPHS ABS: 1.7 10*3/uL (ref 0.7–4.0)
Lymphocytes Relative: 17.1 % (ref 12.0–46.0)
MCHC: 33.4 g/dL (ref 30.0–36.0)
MCV: 94.8 fl (ref 78.0–100.0)
Monocytes Absolute: 0.8 10*3/uL (ref 0.1–1.0)
Monocytes Relative: 7.6 % (ref 3.0–12.0)
NEUTROS PCT: 73.5 % (ref 43.0–77.0)
Neutro Abs: 7.4 10*3/uL (ref 1.4–7.7)
Platelets: 269 10*3/uL (ref 150.0–400.0)
RBC: 4.34 Mil/uL (ref 3.87–5.11)
RDW: 14.2 % (ref 11.5–15.5)
WBC: 10 10*3/uL (ref 4.0–10.5)

## 2018-06-07 NOTE — Progress Notes (Signed)
Subjective:    Patient ID: [name], female    DOB: 15-Jan-1923, 85 y.o.   MRN: [id number]  HPI    85 year old female presents for 3 month follow up.   She has noted that she  Was tired in 04/2017.. now feeling better. No further fatigue or abdomina lpain. Saw NP at other office... labs done.  At that time her wbc count was elevated at 12.5 .Marland Kitchen recommended recheck.  Saw Dermatology yesterday for infected sebaceous cyst.. started on antibiotics.  Completed doxy course. Now on second course of cephalexin   Hypertension:   Elevated in office today. BP Readings from Last 3 Encounters:  06/07/18 (!) 150/86  05/10/18 (!) 102/58  03/04/18 118/76  Using medication without problems or lightheadedness: none Chest pain with exertion:none Edema:none Short of breath: none Average home BPs:  Other issues:  Diabetes:   Well controlled at last check. FBS 140 Lab Results  Component Value Date   HGBA1C 5.7 02/13/2018     Social History /Family History/Past Medical History reviewed in detail and updated in EMR if needed. Blood pressure (!) 150/86, pulse 74, temperature 97.6 F (36.4 C), temperature source Oral, height 5\' 3"  (1.6 m), weight 119 lb 8 oz (54.2 kg), SpO2 99 %.    Review of Systems  Constitutional: Negative for fatigue and fever.  HENT: Negative for congestion.   Eyes: Negative for pain.  Respiratory: Negative for cough and shortness of breath.   Cardiovascular: Negative for chest pain, palpitations and leg swelling.  Gastrointestinal: Negative for abdominal pain.  Genitourinary: Negative for dysuria and vaginal bleeding.  Musculoskeletal: Negative for back pain.  Neurological: Negative for syncope, light-headedness and headaches.  Psychiatric/Behavioral: Negative for dysphoric mood.       Objective:   Physical Exam Constitutional:      General: She is not in acute distress.    Appearance: Normal appearance. She is well-developed. She is not ill-appearing or  toxic-appearing.     Comments: Elderly female  HENT:     Head: Normocephalic.     Right Ear: Hearing, tympanic membrane, ear canal and external ear normal. Tympanic membrane is not erythematous, retracted or bulging.     Left Ear: Hearing, tympanic membrane, ear canal and external ear normal. Tympanic membrane is not erythematous, retracted or bulging.     Nose: No mucosal edema or rhinorrhea.     Right Sinus: No maxillary sinus tenderness or frontal sinus tenderness.     Left Sinus: No maxillary sinus tenderness or frontal sinus tenderness.     Mouth/Throat:     Pharynx: Uvula midline.  Eyes:     General: Lids are normal. Lids are everted, no foreign bodies appreciated.     Conjunctiva/sclera: Conjunctivae normal.     Pupils: Pupils are equal, round, and reactive to light.  Neck:     Musculoskeletal: Normal range of motion and neck supple.     Thyroid: No thyroid mass or thyromegaly.     Vascular: No carotid bruit.     Trachea: Trachea normal.  Cardiovascular:     Rate and Rhythm: Normal rate and regular rhythm.     Pulses: Normal pulses.     Heart sounds: Normal heart sounds, S1 normal and S2 normal. No murmur. No friction rub. No gallop.   Pulmonary:     Effort: Pulmonary effort is normal. No tachypnea or respiratory distress.     Breath sounds: Normal breath sounds. No decreased breath sounds, wheezing, rhonchi or rales.  Abdominal:     General: Bowel sounds are normal.     Palpations: Abdomen is soft.     Tenderness: There is no abdominal tenderness.  Skin:    General: Skin is warm and dry.     Findings: No rash.     Comments: Mildly red, minimmaly tender cyst in central back, not draining,   Neurological:     Mental Status: She is alert.  Psychiatric:        Mood and Affect: Mood is not anxious or depressed.        Speech: Speech normal.        Behavior: Behavior normal. Behavior is cooperative.        Thought Content: Thought content normal.        Judgment: Judgment  normal.           Assessment & Plan:

## 2018-06-07 NOTE — Patient Instructions (Addendum)
Increase warm compresses/soaks/showers to 2-3 times daily.  Start cephalexin today.  Please stop at the lab to have labs drawn.

## 2018-06-13 DIAGNOSIS — D72829 Elevated white blood cell count, unspecified: Secondary | ICD-10-CM | POA: Insufficient documentation

## 2018-06-13 NOTE — Assessment & Plan Note (Addendum)
Follow BP at home especially once pain and infection resolved.

## 2018-06-13 NOTE — Assessment & Plan Note (Signed)
Warm compresses and complete course of antibiotics given by Derm.

## 2018-06-13 NOTE — Assessment & Plan Note (Signed)
Likely due to infected sebaceous cyst., re-eval today, on antibitoics.

## 2018-06-14 ENCOUNTER — Ambulatory Visit: Payer: Medicare Other | Admitting: Family Medicine

## 2018-06-17 ENCOUNTER — Other Ambulatory Visit: Payer: Self-pay | Admitting: Family Medicine

## 2018-06-17 DIAGNOSIS — E119 Type 2 diabetes mellitus without complications: Secondary | ICD-10-CM

## 2018-06-20 ENCOUNTER — Encounter: Payer: Self-pay | Admitting: Family Medicine

## 2018-06-20 ENCOUNTER — Ambulatory Visit (INDEPENDENT_AMBULATORY_CARE_PROVIDER_SITE_OTHER): Payer: Medicare Other | Admitting: Family Medicine

## 2018-06-20 ENCOUNTER — Telehealth: Payer: Self-pay | Admitting: Family Medicine

## 2018-06-20 VITALS — BP 90/60 | HR 92 | Temp 97.3°F | Ht 63.0 in | Wt 115.8 lb

## 2018-06-20 DIAGNOSIS — J069 Acute upper respiratory infection, unspecified: Secondary | ICD-10-CM | POA: Diagnosis not present

## 2018-06-20 NOTE — Telephone Encounter (Signed)
Pt calling concerning precious message. Please call pt.

## 2018-06-20 NOTE — Progress Notes (Signed)
Subjective:     [name] is a 85 y.o. female presenting for Fatigue (started 06/19/2018. She feels some better now. Has had some nasal post nasal drainage. No fever.)     HPI   #Fatigue - started yesterday - feels better now - feels like it is difficult to open eyes - tried flonase and saline spray - falls asleep watching TV and then eventually wakes up at 5 am - getting 8-9 hours of sleep - drinking around 4 glasses of water a day - still eating OK  #Sebacceous cyst - completed 2 courses of antibiotics   Review of Systems  Constitutional: Positive for fatigue. Negative for chills and fever.  HENT: Positive for postnasal drip and sinus pain. Negative for congestion, rhinorrhea, sinus pressure and sore throat.   Respiratory: Positive for cough. Negative for shortness of breath and wheezing.   Cardiovascular: Negative for chest pain.  Gastrointestinal: Positive for constipation. Negative for abdominal pain, diarrhea, nausea and vomiting.  Genitourinary: Negative for dysuria and frequency.  Musculoskeletal: Negative for arthralgias and myalgias.  Neurological: Negative for dizziness, light-headedness and headaches.     Social History   Tobacco Use  Smoking Status Former Smoker  Smokeless Tobacco Never Used  Tobacco Comment   quit 1970        Objective:    BP Readings from Last 3 Encounters:  06/20/18 90/60  06/07/18 (!) 150/86  05/10/18 (!) 102/58   Wt Readings from Last 3 Encounters:  06/20/18 115 lb 12 oz (52.5 kg)  06/07/18 119 lb 8 oz (54.2 kg)  05/10/18 118 lb (53.5 kg)    BP 90/60   Pulse 92   Temp (!) 97.3 F (36.3 C)   Ht 5\' 3"  (1.6 m)   Wt 115 lb 12 oz (52.5 kg)   SpO2 98%   BMI 20.50 kg/m    Physical Exam Constitutional:      General: She is not in acute distress.    Appearance: She is well-developed. She is not diaphoretic.  HENT:     Head: Normocephalic and atraumatic.     Right Ear: Tympanic membrane and ear canal  normal.     Left Ear: Tympanic membrane and ear canal normal.     Nose: Mucosal edema and rhinorrhea present.     Right Sinus: No maxillary sinus tenderness or frontal sinus tenderness.     Left Sinus: No maxillary sinus tenderness or frontal sinus tenderness.     Mouth/Throat:     Pharynx: Uvula midline. Posterior oropharyngeal erythema present. No oropharyngeal exudate.     Tonsils: Swelling: 0 on the right. 0 on the left.  Eyes:     General: No scleral icterus.    Conjunctiva/sclera: Conjunctivae normal.  Neck:     Musculoskeletal: Neck supple.  Cardiovascular:     Rate and Rhythm: Normal rate and regular rhythm.     Heart sounds: Murmur present.  Pulmonary:     Effort: Pulmonary effort is normal. No respiratory distress.     Breath sounds: Normal breath sounds.  Lymphadenopathy:     Cervical: No cervical adenopathy.  Skin:    General: Skin is warm and dry.     Capillary Refill: Capillary refill takes less than 2 seconds.     Comments: Back with sebaceous w/o overlaying erythema. Mildly ttp.    Neurological:     Mental Status: She is alert.           Assessment & Plan:   Problem  List Items Addressed This Visit    None    Visit Diagnoses    Upper respiratory tract infection, unspecified type    -  Primary     BP is mildly low today and suspect she is a little dehydrated.   Emphasized importance of hydration, sleep, and eating during period of grieving for son. May have mild URI but reassured as symptoms are improving per patient.   Symptomatic care prn  Return if symptoms worsen or fail to improve.  Lesleigh Noe, MD

## 2018-06-20 NOTE — Telephone Encounter (Signed)
Pt calling and stated she want some more medication for her cyst on her back. Please call pt to advise.

## 2018-06-20 NOTE — Telephone Encounter (Signed)
Left message for Ms. Culmer to return.

## 2018-06-20 NOTE — Patient Instructions (Signed)
  Antibiotics are not need for a viral infection but the following will help:   1. Drink plenty of fluids - try to get 6 glasses of water per day 2. Get lots of rest 3. Make sure you are still eating   You could try elderberry or airborne   If you develop fevers (Temperature >100.4), chills, worsening symptoms or symptoms lasting longer than 10 days return to clinic.

## 2018-06-20 NOTE — Telephone Encounter (Signed)
Spoke with Ms. [name].  She states [name] (son) passed away last week.  Funeral in Saturday the 29th.  She states she just isn't feeling well and wants to know if she can get a refill on the antibiotics she was taking for her cyst on her back.   I advised that she would need an appointment before antibiotics can be prescribed.  She states she has a bottle of Amoxicillin that Dr. Damita Dunnings gave her that she never took and is asking if it would be okay if she just took those.  I again advised she needs an appointment because if she has a virus antibiotics will not help.  Appointment scheduled today with Dr. Einar Pheasant at 12:20 pm.

## 2018-06-20 NOTE — Telephone Encounter (Signed)
Patient returned Donna's call.  Please call patient back at [phone].

## 2018-06-20 NOTE — Telephone Encounter (Signed)
Please give her my condolences when she comes in.

## 2018-06-28 ENCOUNTER — Telehealth: Payer: Self-pay

## 2018-06-28 NOTE — Telephone Encounter (Signed)
Please call today.. see below notre

## 2018-06-28 NOTE — Telephone Encounter (Signed)
Please call pt and have her make appt on Monday/Tues... go to urgent  Care over weekend or Sat clinic if fever, increase in pain or redness spreading at site.  Make sure doing warm compresses three times daily

## 2018-06-28 NOTE — Telephone Encounter (Signed)
Pt left v/m; pt has taken 2 rounds of abx and pt said she has been told the cyst on her back is still exuding pus. Pt wants to know if should get something done tomorrow or would it be OK to wait until next wk. Pt does not know the urgency of taking care of this cyst on her back and who she should see.  I spoke with pt and asked if she had called dermatologist and pt said she tried to call and the dermatologist was closed. Pt is not having any pain from cyst except when neighbor was cleaning and pushing on it this afternoon. Pt took temp and temp is presently 97.5 oral. Pt request cb with who she should see and when.

## 2018-06-28 NOTE — Telephone Encounter (Signed)
Patient advised.  Appointment scheduled on Tuesday.

## 2018-07-01 DIAGNOSIS — L0291 Cutaneous abscess, unspecified: Secondary | ICD-10-CM | POA: Diagnosis not present

## 2018-07-01 DIAGNOSIS — L720 Epidermal cyst: Secondary | ICD-10-CM | POA: Diagnosis not present

## 2018-07-02 ENCOUNTER — Ambulatory Visit: Payer: Medicare Other | Admitting: Family Medicine

## 2018-08-03 ENCOUNTER — Other Ambulatory Visit: Payer: Self-pay | Admitting: Family Medicine

## 2018-08-03 DIAGNOSIS — E119 Type 2 diabetes mellitus without complications: Secondary | ICD-10-CM

## 2018-08-28 ENCOUNTER — Ambulatory Visit: Payer: Medicare Other | Admitting: Podiatry

## 2018-08-30 ENCOUNTER — Telehealth: Payer: Self-pay | Admitting: Family Medicine

## 2018-08-30 NOTE — Telephone Encounter (Signed)
Discussed Prolia benefits with pt.  Pt has MCR and supplement.  She is not experiencing any Covid sxs.  Pt scheduled for 09-12-2018.

## 2018-09-03 ENCOUNTER — Other Ambulatory Visit: Payer: Medicare Other

## 2018-09-05 ENCOUNTER — Ambulatory Visit: Payer: Medicare Other | Admitting: Family Medicine

## 2018-09-05 NOTE — Telephone Encounter (Signed)
Pt called concerned about being exposed to covid when comes to office 09/12/18 for prolia shot; I reassured pt that she would not need to get out of car and to come to back parking lot and if no one is there to call [phone] and let us know she is here and nurse or CMA will come to the car and they will be masked and gloved to give prolia shot. Pt voiced understanding and is reassured only seeing one person who is masked and gloved and screened daily for covid symptoms would be minimal risk to get covid. Nothing further needed at this time.

## 2018-09-12 ENCOUNTER — Ambulatory Visit (INDEPENDENT_AMBULATORY_CARE_PROVIDER_SITE_OTHER): Payer: Medicare Other | Admitting: *Deleted

## 2018-09-12 DIAGNOSIS — M810 Age-related osteoporosis without current pathological fracture: Secondary | ICD-10-CM | POA: Diagnosis not present

## 2018-09-12 MED ORDER — DENOSUMAB 60 MG/ML ~~LOC~~ SOSY
60.0000 mg | PREFILLED_SYRINGE | Freq: Once | SUBCUTANEOUS | Status: AC
  Administered 2018-09-12: 60 mg via SUBCUTANEOUS

## 2018-09-12 NOTE — Progress Notes (Signed)
Per orders of Dr. Diona Browner, injection of Prolia given by Lauralyn Primes. Patient tolerated injection well.

## 2018-10-07 ENCOUNTER — Other Ambulatory Visit: Payer: Self-pay | Admitting: Family Medicine

## 2018-10-08 ENCOUNTER — Telehealth: Payer: Self-pay | Admitting: Family Medicine

## 2018-10-08 ENCOUNTER — Other Ambulatory Visit: Payer: Self-pay

## 2018-10-08 ENCOUNTER — Other Ambulatory Visit (INDEPENDENT_AMBULATORY_CARE_PROVIDER_SITE_OTHER): Payer: Medicare Other

## 2018-10-08 DIAGNOSIS — E119 Type 2 diabetes mellitus without complications: Secondary | ICD-10-CM | POA: Diagnosis not present

## 2018-10-08 DIAGNOSIS — D72829 Elevated white blood cell count, unspecified: Secondary | ICD-10-CM | POA: Diagnosis not present

## 2018-10-08 LAB — COMPREHENSIVE METABOLIC PANEL
ALT: 13 U/L (ref 0–35)
AST: 17 U/L (ref 0–37)
Albumin: 4 g/dL (ref 3.5–5.2)
Alkaline Phosphatase: 48 U/L (ref 39–117)
BUN: 26 mg/dL — ABNORMAL HIGH (ref 6–23)
CO2: 27 mEq/L (ref 19–32)
Calcium: 9.6 mg/dL (ref 8.4–10.5)
Chloride: 106 mEq/L (ref 96–112)
Creatinine, Ser: 0.99 mg/dL (ref 0.40–1.20)
GFR: 52.26 mL/min — ABNORMAL LOW (ref >60.00)
Glucose, Bld: 90 mg/dL (ref 70–99)
Potassium: 4.5 mEq/L (ref 3.5–5.1)
Sodium: 140 mEq/L (ref 135–145)
Total Bilirubin: 0.5 mg/dL (ref 0.2–1.2)
Total Protein: 7.1 g/dL (ref 6.0–8.3)

## 2018-10-08 LAB — CBC
HCT: 43 % (ref 36.0–46.0)
Hemoglobin: 14.2 g/dL (ref 12.0–15.0)
MCHC: 33 g/dL (ref 30.0–36.0)
MCV: 97.2 fl (ref 78.0–100.0)
Platelets: 277 10*3/uL (ref 150.0–400.0)
RBC: 4.42 Mil/uL (ref 3.87–5.11)
RDW: 14.9 % (ref 11.5–15.5)
WBC: 9.4 10*3/uL (ref 4.0–10.5)

## 2018-10-08 LAB — HEMOGLOBIN A1C: Hgb A1c MFr Bld: 5.9 % (ref 4.6–6.5)

## 2018-10-08 NOTE — Telephone Encounter (Signed)
-----   Message from Cloyd Stagers, RT sent at 10/02/2018  3:21 PM EDT ----- Regarding: Lab Orders for Tuesday 6.16.2020 Lab orders please for 3 mo  fasting labs on Tuesday 6.16.2020,  5mo f/u Doxy[facility]me visit on Friday 6.19.2020 Thank you, Dyke Maes RT(R)

## 2018-10-11 ENCOUNTER — Ambulatory Visit (INDEPENDENT_AMBULATORY_CARE_PROVIDER_SITE_OTHER): Payer: Medicare Other | Admitting: Family Medicine

## 2018-10-11 ENCOUNTER — Encounter: Payer: Self-pay | Admitting: Family Medicine

## 2018-10-11 VITALS — BP 131/74 | HR 77 | Ht 63.0 in

## 2018-10-11 DIAGNOSIS — E119 Type 2 diabetes mellitus without complications: Secondary | ICD-10-CM

## 2018-10-11 DIAGNOSIS — K5909 Other constipation: Secondary | ICD-10-CM

## 2018-10-11 DIAGNOSIS — I10 Essential (primary) hypertension: Secondary | ICD-10-CM

## 2018-10-11 NOTE — Assessment & Plan Note (Signed)
Well controlled with diet. 

## 2018-10-11 NOTE — Progress Notes (Signed)
VIRTUAL VISIT Due to national recommendations of social distancing due to [place] 19, a virtual visit is felt to be most appropriate for this patient at this time.   I connected with the patient on 10/11/18 at  9:40 AM EDT by virtual telehealth platform and verified that I am speaking with the correct person using two identifiers.   I discussed the limitations, risks, security and privacy concerns of performing an evaluation and management service by  virtual telehealth platform and the availability of in person appointments. I also discussed with the patient that there may be a patient responsible charge related to this service. The patient expressed understanding and agreed to proceed.  Patient location: Home Provider Location: [facility] Participants: Eliezer Lofts and Ardis Rowan   Chief Complaint  Patient presents with  . Follow-up    3 month    History of Present Illness: 85 year old female presents for 3 months follow up DM, HTN.  Hypertension:  On ramipril 5 mg daily.. borderline control but tolerable for age.  BP Readings from Last 3 Encounters:  10/11/18 131/74  06/20/18 90/60  06/07/18 (!) 150/86   Using medication without problems or lightheadedness: none Chest pain with exertion:none Edema:none Short of breath:none Average home BPs: Other issues:  Diabetes:  Diet controlled. Lab Results  Component Value Date   HGBA1C 5.9 10/08/2018  Using medications without difficulties: Hypoglycemic episodes:none Hyperglycemic episodes:none Feet problems: no ulcer, no neuropathy Blood Sugars averaging:FBS 80-110 eye exam within last year:none   COVID 19 screen No recent travel or known exposure to [place] The patient denies respiratory symptoms of COVID 19 at this time.  The importance of social distancing was discussed today.   Review of Systems  Constitutional: Negative for chills and fever.  HENT: Negative for congestion and ear pain.   Eyes: Negative  for pain and redness.  Respiratory: Negative for cough and shortness of breath.   Cardiovascular: Negative for chest pain, palpitations and leg swelling.  Gastrointestinal: Negative for abdominal pain, blood in stool, constipation, diarrhea, nausea and vomiting.  Genitourinary: Negative for dysuria.  Musculoskeletal: Negative for falls and myalgias.  Skin: Negative for rash.  Neurological: Negative for dizziness.  Psychiatric/Behavioral: Negative for depression. The patient is not nervous/anxious.       Past Medical History:  Diagnosis Date  . Acute pharyngitis   . Acute sinusitis, unspecified   . Acute upper respiratory infections of unspecified site   . Allergy   . Anal and rectal polyp   . Cataract    History of  . Cavernous angioma   . Degenerative cervical disc 02/2006  . Diabetes mellitus    Type II  . GERD (gastroesophageal reflux disease)   . Gout   . Hemangioma of unspecified site   . Hyperlipidemia   . Hypertension   . Orthostatic hypotension 04/2006   Hospital  . Osteoarthritis   . Osteopenia   . Other abscess of vulva   . Palpitations   . Syncope and collapse    1 episode    reports that she has quit smoking. She has never used smokeless tobacco. She reports current alcohol use. She reports that she does not use drugs.   Current Outpatient Medications:  .  ACCU-CHEK AVIVA PLUS test strip, USE TO CHECK BLOOD SUGAR DAILY AS NEEDED. DX: E11.9, Disp: 100 each, Rfl: 3 .  acetaminophen (TYLENOL) 325 MG tablet, Take 650 mg by mouth 2 (two) times daily as needed. , Disp: , Rfl:  .  aspirin EC 81 MG tablet, Take 81 mg by mouth at bedtime. , Disp: , Rfl:  .  Blood Glucose Monitoring Suppl (ACCU-CHEK GUIDE) w/Device KIT, 1 each by Does not apply route daily. Use to check blood sugar daily as needed.  Dx: E11.9, Disp: 1 kit, Rfl: 0 .  fish oil-omega-3 fatty acids 1000 MG capsule, Take 1 g by mouth daily.  , Disp: , Rfl:  .  fluticasone (FLONASE) 50 MCG/ACT nasal spray,  SPRAY 2 SPRAYS INTO EACH NOSTRIL EVERY DAY, Disp: 16 g, Rfl: 3 .  Lancets (FREESTYLE) lancets, CHECK BLOOD SUGAR 1-2 TIMES A DAY. DX: E11.9 NON INSULIN DEPENDENT, Disp: 100 each, Rfl: 3 .  Multiple Vitamin (MULTIVITAMIN WITH MINERALS) TABS tablet, Take 1 tablet by mouth daily., Disp: , Rfl:  .  Polyethyl Glycol-Propyl Glycol (SYSTANE OP), Place 1 drop into both eyes daily as needed (for dry eyes). , Disp: , Rfl:  .  polyethylene glycol (MIRALAX / GLYCOLAX) packet, Take 17 g by mouth daily as needed. , Disp: , Rfl:  .  ramipril (ALTACE) 5 MG capsule, TAKE 1 CAPSULE (5 MG TOTAL) BY MOUTH DAILY., Disp: 90 capsule, Rfl: 1 .  triamcinolone lotion (KENALOG) 0.1 %, , Disp: , Rfl:    Observations/Objective: Blood pressure 131/74, pulse 77, height 5' 3"  (1.6 m).   Physical Exam  Physical Exam Constitutional:      General: The patient is not in acute distress. Pulmonary:     Effort: Pulmonary effort is normal. No respiratory distress.  Neurological:     Mental Status: The patient is alert and oriented to person, place, and time.  Psychiatric:        Mood and Affect: Mood normal.        Behavior: Behavior normal.   Assessment and Plan Essential hypertension, benign Tolerable control for age on ACEi.   Diabetes mellitus type 2, controlled, without complications Well controlled with diet  Constipation, chronic  Stable control on miralax prn \    I discussed the assessment and treatment plan with the patient. The patient was provided an opportunity to ask questions and all were answered. The patient agreed with the plan and demonstrated an understanding of the instructions.   The patient was advised to call back or seek an in-person evaluation if the symptoms worsen or if the condition fails to improve as anticipated.     Eliezer Lofts, MD

## 2018-10-11 NOTE — Assessment & Plan Note (Signed)
Stable control on miralax prn

## 2018-10-11 NOTE — Assessment & Plan Note (Signed)
Tolerable control for age on ACEi.

## 2018-10-16 ENCOUNTER — Telehealth: Payer: Self-pay

## 2018-10-16 MED ORDER — OMEPRAZOLE 40 MG PO CPDR: 40.0000 mg | DELAYED_RELEASE_CAPSULE | Freq: Every day | ORAL | 3 refills | Status: DC

## 2018-10-16 NOTE — Telephone Encounter (Signed)
Pt had visit on 10/11/18 for 3 mth FU; pt said Dr Diona Browner asked her how the omeprazole was doing. Pt has been taking miralax for constipation and pt does not remember why she stopped taking the omeprazole 40 mg.pt has 3 pills left of omeprazole  and since 10/14/18 pt has had reflux on and off and pain above the waist line on lt side. Pt has been drinking more water and this afternoon pt is not having any lt sided pain. Pt wants to know if should take the omeprazole or not. Pt request call to go to [facility] and pt request cb. [facility] [employer].

## 2018-10-16 NOTE — Telephone Encounter (Signed)
yes

## 2018-10-16 NOTE — Telephone Encounter (Addendum)
Spoke with Ms. Glasser and advised her to restart her Omeprazole 40 mg daily per Dr. Lorelei Pont.  Refill sent to [place]

## 2018-10-21 ENCOUNTER — Other Ambulatory Visit: Payer: Self-pay

## 2018-10-21 ENCOUNTER — Encounter: Payer: Self-pay | Admitting: Podiatry

## 2018-10-21 ENCOUNTER — Ambulatory Visit (INDEPENDENT_AMBULATORY_CARE_PROVIDER_SITE_OTHER): Payer: Medicare Other | Admitting: Podiatry

## 2018-10-21 DIAGNOSIS — E1142 Type 2 diabetes mellitus with diabetic polyneuropathy: Secondary | ICD-10-CM | POA: Diagnosis not present

## 2018-10-21 DIAGNOSIS — M79675 Pain in left toe(s): Secondary | ICD-10-CM

## 2018-10-21 DIAGNOSIS — M79674 Pain in right toe(s): Secondary | ICD-10-CM

## 2018-10-21 DIAGNOSIS — L84 Corns and callosities: Secondary | ICD-10-CM

## 2018-10-21 DIAGNOSIS — B351 Tinea unguium: Secondary | ICD-10-CM | POA: Diagnosis not present

## 2018-10-21 NOTE — Patient Instructions (Signed)
Diabetes Mellitus and Foot Care Foot care is an important part of your health, especially when you have diabetes. Diabetes may cause you to have problems because of poor blood flow (circulation) to your feet and legs, which can cause your skin to:  Become thinner and drier.  Break more easily.  Heal more slowly.  Peel and crack. You may also have nerve damage (neuropathy) in your legs and feet, causing decreased feeling in them. This means that you may not notice minor injuries to your feet that could lead to more serious problems. Noticing and addressing any potential problems early is the best way to prevent future foot problems. How to care for your feet Foot hygiene  Wash your feet daily with warm water and mild soap. Do not use hot water. Then, pat your feet and the areas between your toes until they are completely dry. Do not soak your feet as this can dry your skin.  Trim your toenails straight across. Do not dig under them or around the cuticle. File the edges of your nails with an emery board or nail file.  Apply a moisturizing lotion or petroleum jelly to the skin on your feet and to dry, brittle toenails. Use lotion that does not contain alcohol and is unscented. Do not apply lotion between your toes. Shoes and socks  Wear clean socks or stockings every day. Make sure they are not too tight. Do not wear knee-high stockings since they may decrease blood flow to your legs.  Wear shoes that fit properly and have enough cushioning. Always look in your shoes before you put them on to be sure there are no objects inside.  To break in new shoes, wear them for just a few hours a day. This prevents injuries on your feet. Wounds, scrapes, corns, and calluses  Check your feet daily for blisters, cuts, bruises, sores, and redness. If you cannot see the bottom of your feet, use a mirror or ask someone for help.  Do not cut corns or calluses or try to remove them with medicine.  If you  find a minor scrape, cut, or break in the skin on your feet, keep it and the skin around it clean and dry. You may clean these areas with mild soap and water. Do not clean the area with peroxide, alcohol, or iodine.  If you have a wound, scrape, corn, or callus on your foot, look at it several times a day to make sure it is healing and not infected. Check for: ? Redness, swelling, or pain. ? Fluid or blood. ? Warmth. ? Pus or a bad smell. General instructions  Do not cross your legs. This may decrease blood flow to your feet.  Do not use heating pads or hot water bottles on your feet. They may burn your skin. If you have lost feeling in your feet or legs, you may not know this is happening until it is too late.  Protect your feet from hot and cold by wearing shoes, such as at the beach or on hot pavement.  Schedule a complete foot exam at least once a year (annually) or more often if you have foot problems. If you have foot problems, report any cuts, sores, or bruises to your health care provider immediately. Contact a health care provider if:  You have a medical condition that increases your risk of infection and you have any cuts, sores, or bruises on your feet.  You have an injury that is not   healing.  You have redness on your legs or feet.  You feel burning or tingling in your legs or feet.  You have pain or cramps in your legs and feet.  Your legs or feet are numb.  Your feet always feel cold.  You have pain around a toenail. Get help right away if:  You have a wound, scrape, corn, or callus on your foot and: ? You have pain, swelling, or redness that gets worse. ? You have fluid or blood coming from the wound, scrape, corn, or callus. ? Your wound, scrape, corn, or callus feels warm to the touch. ? You have pus or a bad smell coming from the wound, scrape, corn, or callus. ? You have a fever. ? You have a red line going up your leg. Summary  Check your feet every day  for cuts, sores, red spots, swelling, and blisters.  Moisturize feet and legs daily.  Wear shoes that fit properly and have enough cushioning.  If you have foot problems, report any cuts, sores, or bruises to your health care provider immediately.  Schedule a complete foot exam at least once a year (annually) or more often if you have foot problems. This information is not intended to replace advice given to you by your health care provider. Make sure you discuss any questions you have with your health care provider. Document Released: 04/07/2000 Document Revised: 05/23/2017 Document Reviewed: 05/12/2016 Elsevier Patient Education  2020 Elsevier Inc.  

## 2018-10-26 NOTE — Progress Notes (Signed)
Subjective: [name] presents with diabetes, diabetic neuropathy and cc of painful, mycotic toenails, calluses and corn which interfere with activities of daily living. Pain is aggravated when wearing enclosed shoe gear. Pain is relieved with periodic professional debridement.  [name], MD is her PCP and last visit was 10/11/2018.   Current Outpatient Medications:  .  ACCU-CHEK AVIVA PLUS test strip, USE TO CHECK BLOOD SUGAR DAILY AS NEEDED. DX: E11.9, Disp: 100 each, Rfl: 3 .  acetaminophen (TYLENOL) 325 MG tablet, Take 650 mg by mouth 2 (two) times daily as needed. , Disp: , Rfl:  .  aspirin EC 81 MG tablet, Take 81 mg by mouth at bedtime. , Disp: , Rfl:  .  Blood Glucose Monitoring Suppl (ACCU-CHEK GUIDE) w/Device KIT, 1 each by Does not apply route daily. Use to check blood sugar daily as needed.  Dx: E11.9, Disp: 1 kit, Rfl: 0 .  fish oil-omega-3 fatty acids 1000 MG capsule, Take 1 g by mouth daily.  , Disp: , Rfl:  .  fluticasone (FLONASE) 50 MCG/ACT nasal spray, SPRAY 2 SPRAYS INTO EACH NOSTRIL EVERY DAY, Disp: 16 g, Rfl: 3 .  Lancets (FREESTYLE) lancets, CHECK BLOOD SUGAR 1-2 TIMES A DAY. DX: E11.9 NON INSULIN DEPENDENT, Disp: 100 each, Rfl: 3 .  Multiple Vitamin (MULTIVITAMIN WITH MINERALS) TABS tablet, Take 1 tablet by mouth daily., Disp: , Rfl:  .  NON FORMULARY, [place] [facility]  ANTI-FUNGAL (NAIL)-#1, Disp: , Rfl:  .  omeprazole (PRILOSEC) 40 MG capsule, Take 1 capsule (40 mg total) by mouth daily., Disp: 30 capsule, Rfl: 3 .  Polyethyl Glycol-Propyl Glycol (SYSTANE OP), Place 1 drop into both eyes daily as needed (for dry eyes). , Disp: , Rfl:  .  polyethylene glycol (MIRALAX / GLYCOLAX) packet, Take 17 g by mouth daily as needed. , Disp: , Rfl:  .  ramipril (ALTACE) 5 MG capsule, TAKE 1 CAPSULE (5 MG TOTAL) BY MOUTH DAILY., Disp: 90 capsule, Rfl: 1 .  triamcinolone lotion (KENALOG) 0.1 %, , Disp: , Rfl:   Allergies  Allergen Reactions  . Codeine Nausea And  Vomiting  . Statins Other (See Comments)    Pt does not remember reaction    Objective: There were no vitals filed for this visit.  Vascular Examination: Capillary refill time <3 seconds x 10 digits.  Dorsalis pedis pulses present b/l.  Posterior tibial pulses present b/l.  Digital hair decreased x 10 digits.  Skin temperature gradient WNL b/l.  Dermatological Examination: Pedal skin thin and atrophic b/l.  Toenails 1-5 b/l discolored, thick, dystrophic with subungual debris and pain with palpation to nailbeds due to thickness of nails.  Hyperkeratotic lesion(s) b/l hallux and dorsomedial 2nd digit left foot. No erythema, no edema, no drainage, no flocculence noted.   Musculoskeletal: Muscle strength 5/5 to all LE muscle groups.  Hammertoes 2-5 b/l.  No pain, crepitus or joint limitation with passive/active ROM.  Neurological: Sensation diminished with 10 gram monofilament.  Assessment: 1. Painful onychomycosis toenails 1-5 b/l 2. Callus b/l hallux 3. Corn left 2nd digit 4. NIDDM with Diabetic neuropathy  Plan: 1. Continue diabetic foot care principles. Literature dispensed on today. 2. Toenails 1-5 b/l were debrided in length and girth without iatrogenic bleeding. 3. Calluses pared b/l hallux utilizing sterile scalpel blade without incident. 4. Corn(s) pared left 2nd digit utilizing sterile scalpel blade without incident.  5. Patient to continue soft, supportive shoe gear 6. Patient to report any pedal injuries to medical professional  7.  Follow up 3 months.  8. Patient/POA to call should there be a concern in the interim.

## 2018-10-28 ENCOUNTER — Ambulatory Visit (INDEPENDENT_AMBULATORY_CARE_PROVIDER_SITE_OTHER): Payer: Medicare Other | Admitting: Family Medicine

## 2018-10-28 ENCOUNTER — Other Ambulatory Visit: Payer: Medicare Other

## 2018-10-28 ENCOUNTER — Encounter: Payer: Self-pay | Admitting: Family Medicine

## 2018-10-28 VITALS — BP 126/74 | HR 88 | Temp 96.5°F | Wt 120.0 lb

## 2018-10-28 DIAGNOSIS — N3000 Acute cystitis without hematuria: Secondary | ICD-10-CM | POA: Diagnosis not present

## 2018-10-28 DIAGNOSIS — R300 Dysuria: Secondary | ICD-10-CM | POA: Diagnosis not present

## 2018-10-28 LAB — POCT URINALYSIS DIPSTICK
Bilirubin, UA: NEGATIVE
Blood, UA: POSITIVE
Glucose, UA: NEGATIVE
Ketones, UA: NEGATIVE
Nitrite, UA: NEGATIVE
Protein, UA: NEGATIVE
Spec Grav, UA: 1.02 (ref 1.010–1.025)
Urobilinogen, UA: NEGATIVE E.U./dL — AB
pH, UA: 5.5 (ref 5.0–8.0)

## 2018-10-28 MED ORDER — CEPHALEXIN 500 MG PO CAPS: 500.0000 mg | ORAL_CAPSULE | Freq: Two times a day (BID) | ORAL | 0 refills | Status: AC

## 2018-10-28 NOTE — Progress Notes (Signed)
I connected with [name] on 10/28/18 at  2:00 PM EDT by video and verified that I am speaking with the correct person using two identifiers.   I discussed the limitations, risks, security and privacy concerns of performing an evaluation and management service by video and the availability of in person appointments. I also discussed with the patient that there may be a patient responsible charge related to this service. The patient expressed understanding and agreed to proceed.  Patient location: Home Provider Location: [place] Participants: Lesleigh Noe and Ardis Rowan   Subjective:     [name] is a 85 y.o. female presenting for Urinary Urgency (symptoms started about 2 weeks ago. Urinary discomfort, some urgency and frequency. No blood in urine. Back pain but that has been present for 2 years. )     Dysuria  This is a new problem. The current episode started 1 to 4 weeks ago. The problem occurs intermittently. The problem has been gradually worsening. There has been no fever. Associated symptoms include frequency and urgency. Pertinent negatives include no chills, hematuria, hesitancy, nausea, sweats or vomiting. Associated symptoms comments: Chronic leakage of urine. She has tried increased fluids for the symptoms. The treatment provided mild relief.        Review of Systems  Constitutional: Negative for chills.  Gastrointestinal: Negative for abdominal pain, nausea and vomiting.  Genitourinary: Positive for dysuria, frequency and urgency. Negative for hematuria and hesitancy.     Social History   Tobacco Use  Smoking Status Former Smoker  Smokeless Tobacco Never Used  Tobacco Comment   quit 1970        Objective:   BP Readings from Last 3 Encounters:  10/28/18 126/74  10/11/18 131/74  06/20/18 90/60   Wt Readings from Last 3 Encounters:  10/28/18 120 lb (54.4 kg)  06/20/18 115 lb 12 oz (52.5 kg)  06/07/18 119 lb 8 oz  (54.2 kg)    BP 126/74 Comment: per patient  Pulse 88 Comment: per patient  Temp (!) 96.5 F (35.8 C) Comment: per patient  Wt 120 lb (54.4 kg) Comment: per patient  BMI 21.26 kg/m    Physical Exam Constitutional:      Appearance: Normal appearance. She is not ill-appearing.  HENT:     Head: Normocephalic and atraumatic.     Right Ear: External ear normal.     Left Ear: External ear normal.  Eyes:     Conjunctiva/sclera: Conjunctivae normal.  Pulmonary:     Effort: Pulmonary effort is normal. No respiratory distress.  Neurological:     Mental Status: She is alert. Mental status is at baseline.  Psychiatric:        Mood and Affect: Mood normal.        Behavior: Behavior normal.        Thought Content: Thought content normal.        Judgment: Judgment normal.     UA: + LE, neg nitrites       Assessment & Plan:   Problem List Items Addressed This Visit    None    Visit Diagnoses    Dysuria    -  Primary   Relevant Orders   POCT urinalysis dipstick (Completed)   Urine Culture   Acute cystitis without hematuria       Relevant Medications   cephALEXin (KEFLEX) 500 MG capsule     UA with signs infection and symptoms consistent Will f/u culture  Start Abx now  Return if symptoms worsen or fail to improve.  Lesleigh Noe, MD

## 2018-10-29 LAB — URINE CULTURE
MICRO NUMBER:: 636992
SPECIMEN QUALITY:: ADEQUATE

## 2018-10-30 ENCOUNTER — Telehealth: Payer: Self-pay

## 2018-10-30 NOTE — Telephone Encounter (Signed)
Called patient to relay information about her urine culture results. There was no answer and no option to leave a message. Will try again later.

## 2019-01-15 DIAGNOSIS — Z23 Encounter for immunization: Secondary | ICD-10-CM | POA: Diagnosis not present

## 2019-01-22 ENCOUNTER — Other Ambulatory Visit: Payer: Self-pay

## 2019-01-22 ENCOUNTER — Ambulatory Visit (INDEPENDENT_AMBULATORY_CARE_PROVIDER_SITE_OTHER): Payer: Medicare Other | Admitting: Podiatry

## 2019-01-22 ENCOUNTER — Encounter: Payer: Self-pay | Admitting: Podiatry

## 2019-01-22 DIAGNOSIS — M79675 Pain in left toe(s): Secondary | ICD-10-CM

## 2019-01-22 DIAGNOSIS — M79674 Pain in right toe(s): Secondary | ICD-10-CM

## 2019-01-22 DIAGNOSIS — E1142 Type 2 diabetes mellitus with diabetic polyneuropathy: Secondary | ICD-10-CM

## 2019-01-22 DIAGNOSIS — B351 Tinea unguium: Secondary | ICD-10-CM | POA: Diagnosis not present

## 2019-01-22 DIAGNOSIS — L84 Corns and callosities: Secondary | ICD-10-CM

## 2019-01-22 NOTE — Patient Instructions (Signed)

## 2019-01-23 NOTE — Progress Notes (Signed)
Subjective:  [name] presents to clinic today with cc of  painful, thick, discolored, elongated toenails 1-5 b/l that become tender and cannot cut because of thickness. Pain is aggravated when wearing enclosed shoe gear.  [name], MD is her PCP.   Current Outpatient Medications on File Prior to Visit  Medication Sig Dispense Refill  . ACCU-CHEK AVIVA PLUS test strip USE TO CHECK BLOOD SUGAR DAILY AS NEEDED. DX: E11.9 100 each 3  . acetaminophen (TYLENOL) 325 MG tablet Take 650 mg by mouth 2 (two) times daily as needed.     Marland Kitchen aspirin EC 81 MG tablet Take 81 mg by mouth at bedtime.     . Blood Glucose Monitoring Suppl (ACCU-CHEK GUIDE) w/Device KIT 1 each by Does not apply route daily. Use to check blood sugar daily as needed.  Dx: E11.9 1 kit 0  . fish oil-omega-3 fatty acids 1000 MG capsule Take 1 g by mouth daily.      . fluticasone (FLONASE) 50 MCG/ACT nasal spray SPRAY 2 SPRAYS INTO EACH NOSTRIL EVERY DAY 16 g 3  . Lancets (FREESTYLE) lancets CHECK BLOOD SUGAR 1-2 TIMES A DAY. DX: E11.9 NON INSULIN DEPENDENT 100 each 3  . Multiple Vitamin (MULTIVITAMIN WITH MINERALS) TABS tablet Take 1 tablet by mouth daily.    Marland Kitchen omeprazole (PRILOSEC) 40 MG capsule Take 1 capsule (40 mg total) by mouth daily. 30 capsule 3  . Polyethyl Glycol-Propyl Glycol (SYSTANE OP) Place 1 drop into both eyes daily as needed (for dry eyes).     . polyethylene glycol (MIRALAX / GLYCOLAX) packet Take 17 g by mouth daily as needed.     . Probiotic Product (ALIGN PO) Take by mouth.    . ramipril (ALTACE) 5 MG capsule TAKE 1 CAPSULE (5 MG TOTAL) BY MOUTH DAILY. 90 capsule 1  . triamcinolone lotion (KENALOG) 0.1 %      No current facility-administered medications on file prior to visit.      Allergies  Allergen Reactions  . Codeine Nausea And Vomiting  . Statins Other (See Comments)    Pt does not remember reaction     Objective: Physical Examination:  Vascular Examination: Capillary refill time  <3 seconds x 10 digits.  Palpable DP/PT pulses b/l.  Digital hair decreased b/l.  No edema noted b/l.  Skin temperature gradient WNL b/l.  Dermatological Examination: Skin with normal turgor, texture and tone b/l.  No open wounds b/l.  No interdigital macerations noted b/l.  Elongated, thick, discolored brittle toenails with subungual debris and pain on dorsal palpation of nailbeds 1-5 b/l.  Hyperkeratotic lesion right hallux. No erythema, no edema, no drainage, no flocculence noted. Resolved hyperkeratosis left hallux and left 2nd digit.  Musculoskeletal Examination: Muscle strength 5/5 to all muscle groups b/l.  Hammertoes 2-5 b/l.  No pain, crepitus or joint discomfort with active/passive ROM.  Neurological Examination: Sensation diminished b/l with 10 gram monofilament.  Assessment: Mycotic nail infection with pain 1-5 b/l Callus right hallux Hammertoes 2-5 b/l NIDDM with neuropathy  Plan: 1. Toenails 1-5 b/l were debrided in length and girth without iatrogenic laceration. 2. Hyperkeratotic lesions pared right hallux utilizing sterile scalpel blade without incident. 3. Continue soft, supportive shoe gear daily. Dispensed toe cap for protection of left 2nd digit. 4. Report any pedal injuries to medical professional. 5. Follow up 3 months. 5.  Patient/POA to call should there be a question/concern in there interim.

## 2019-01-27 ENCOUNTER — Telehealth: Payer: Self-pay | Admitting: Podiatry

## 2019-01-27 NOTE — Telephone Encounter (Signed)
Pt called and is not wanting to come back in as of now for diabetic shoe measurements. She is going to wait until January at her next appt and be measured at that time. I have added her to Rick's schedule on same day as her 3 month appt with you.

## 2019-01-30 ENCOUNTER — Other Ambulatory Visit: Payer: Medicare Other | Admitting: Orthotics

## 2019-02-13 ENCOUNTER — Telehealth: Payer: Self-pay | Admitting: Family Medicine

## 2019-02-13 DIAGNOSIS — E119 Type 2 diabetes mellitus without complications: Secondary | ICD-10-CM

## 2019-02-13 NOTE — Telephone Encounter (Signed)
-----   Message from Ellamae Sia sent at 02/10/2019 11:48 AM EDT ----- Regarding: Lab orders for Tuesday, 10.27.20 Patient is scheduled for CPX labs, please order future labs, Thanks , Karna Christmas

## 2019-02-14 ENCOUNTER — Telehealth: Payer: Self-pay

## 2019-02-14 NOTE — Telephone Encounter (Signed)
Discussed [employer] benefits w/pt.  Pt would owe approximately $0.  Pt understands and agrees.  Pt scheduled.

## 2019-02-18 ENCOUNTER — Ambulatory Visit: Payer: Medicare Other

## 2019-02-18 ENCOUNTER — Other Ambulatory Visit: Payer: Self-pay

## 2019-02-18 ENCOUNTER — Other Ambulatory Visit (INDEPENDENT_AMBULATORY_CARE_PROVIDER_SITE_OTHER): Payer: Medicare Other

## 2019-02-18 DIAGNOSIS — E119 Type 2 diabetes mellitus without complications: Secondary | ICD-10-CM

## 2019-02-18 LAB — COMPREHENSIVE METABOLIC PANEL
ALT: 16 U/L (ref 0–35)
AST: 19 U/L (ref 0–37)
Albumin: 4.1 g/dL (ref 3.5–5.2)
Alkaline Phosphatase: 43 U/L (ref 39–117)
BUN: 31 mg/dL — ABNORMAL HIGH (ref 6–23)
CO2: 28 mEq/L (ref 19–32)
Calcium: 9.7 mg/dL (ref 8.4–10.5)
Chloride: 105 mEq/L (ref 96–112)
Creatinine, Ser: 0.98 mg/dL (ref 0.40–1.20)
GFR: 52.83 mL/min — ABNORMAL LOW (ref >60.00)
Glucose, Bld: 97 mg/dL (ref 70–99)
Potassium: 4.5 mEq/L (ref 3.5–5.1)
Sodium: 140 mEq/L (ref 135–145)
Total Bilirubin: 0.5 mg/dL (ref 0.2–1.2)
Total Protein: 7.2 g/dL (ref 6.0–8.3)

## 2019-02-18 LAB — HEMOGLOBIN A1C: Hgb A1c MFr Bld: 5.8 % (ref 4.6–6.5)

## 2019-02-18 NOTE — Progress Notes (Signed)
No critical labs need to be addressed urgently. We will discuss labs in detail at upcoming office visit.   

## 2019-02-27 ENCOUNTER — Telehealth: Payer: Self-pay

## 2019-02-27 NOTE — Telephone Encounter (Signed)
Spoke with Ms. [name].  I have changed her to a [name].  She already has her paperwork.  I ask that she call the front desk when she gets here to let them know she is in the parking lot and I will go get her and bring her in my side door. Patient in agreement with plan.

## 2019-02-27 NOTE — Telephone Encounter (Signed)
Pt left v/m that has virtual annual appt on 02/28/19 at 2:20 and pt has been having lt side pain on and off since 02/23/19; pts last normal BM was 02/26/19. Pt said the side pain is a lot better since having BM but still uncomfortable like there is a "stitch there"pain level now is 1 -2.pt has been taking miralax and drinking more water but would like to change appt from virtual to in office.Please advise.Pt has no covid symptoms, no travel and no known exposure to + covid.

## 2019-02-28 ENCOUNTER — Ambulatory Visit (INDEPENDENT_AMBULATORY_CARE_PROVIDER_SITE_OTHER): Payer: Medicare Other | Admitting: Family Medicine

## 2019-02-28 ENCOUNTER — Other Ambulatory Visit: Payer: Self-pay

## 2019-02-28 ENCOUNTER — Encounter: Payer: Medicare Other | Admitting: Family Medicine

## 2019-02-28 ENCOUNTER — Encounter: Payer: Self-pay | Admitting: Family Medicine

## 2019-02-28 VITALS — BP 110/60 | HR 88 | Temp 98.7°F | Ht 63.0 in | Wt 117.5 lb

## 2019-02-28 DIAGNOSIS — Z0000 Encounter for general adult medical examination without abnormal findings: Secondary | ICD-10-CM

## 2019-02-28 DIAGNOSIS — K5909 Other constipation: Secondary | ICD-10-CM | POA: Diagnosis not present

## 2019-02-28 DIAGNOSIS — M810 Age-related osteoporosis without current pathological fracture: Secondary | ICD-10-CM

## 2019-02-28 DIAGNOSIS — E119 Type 2 diabetes mellitus without complications: Secondary | ICD-10-CM | POA: Diagnosis not present

## 2019-02-28 DIAGNOSIS — R1013 Epigastric pain: Secondary | ICD-10-CM

## 2019-02-28 DIAGNOSIS — I10 Essential (primary) hypertension: Secondary | ICD-10-CM

## 2019-02-28 NOTE — Assessment & Plan Note (Signed)
Well controlled. Continue current medication.  

## 2019-02-28 NOTE — Assessment & Plan Note (Signed)
NML CMET. Eval with cbc and lipase.  Family history of pancreatic cancer. In son. Most liekly gastritis/GERD.Marland Kitchen now back on PPI daily. Iif not better consider further eval.

## 2019-02-28 NOTE — Assessment & Plan Note (Signed)
Diet controlled.  

## 2019-02-28 NOTE — Assessment & Plan Note (Signed)
Encouraged miralax daily, increase water and restart fiber.  Consider trial of linzess if not improving.

## 2019-02-28 NOTE — Assessment & Plan Note (Signed)
On prolia.Marland Kitchen she wishes to hold off on DEXA at this time given COVID.

## 2019-02-28 NOTE — Progress Notes (Signed)
Chief Complaint  Patient presents with  . Medicare Wellness    History of Present Illness: HPI The patient presents for annual medicare wellness, complete physical and review of chronic health problems. He/She also has the following acute concerns today: She has had some off and on intermittant Left upper quadrant abd pain in last week, worsened 2 days ago.  She took miralax and felt better. Trying to drink a lot of water. Had a BM this AM, but strained.  Also:Epigastric pain.. has not been taking omepraolze.. restarted a few days then ran out... now back on. Burping  I have personally reviewed the Medicare Annual Wellness questionnaire and have noted 1. The patient's medical and social history 2. Their use of alcohol, tobacco or illicit drugs 3. Their current medications and supplements 4. The patient's functional ability including ADL's, fall risks, home safety risks and hearing or visual             impairment. 5. Diet and physical activities 6. Evidence for depression or mood disorders 7.         Updated provider list Cognitive evaluation was performed and recorded on pt medicare questionnaire form. The patients weight, height, BMI and visual acuity have been recorded in the chart  I have made referrals, counseling and provided education to the patient based review of the above and I have provided the pt with a written personalized care plan for preventive services.   Documentation of this information was scanned into the electronic record under the media tab.   Advance directives and end of life planning reviewed in detail with patient and documented in EMR. Patient given handout on advance care directives if needed. HCPOA and living will updated if needed.    Office Visit from 02/28/2019 in [facility]  PHQ-2 Total Score  0     Fall Risk  02/28/2019 03/08/2018 02/13/2018 02/12/2017 02/10/2016  Falls in the past year? 0 0 Yes Yes Yes  Comment - Emmi  Telephone Survey: data to providers prior to load pt fell while walking into church; denies injury tripped while walking into church; lost balance when entering door at home -  Number falls in past yr: - - 1 2 or more 2 or more  Injury with Fall? - - No Yes Yes  Risk for fall due to : - - History of fall(s);Impaired balance/gait - -    Hearing Screening   Method: Audiometry   '125Hz'$  '250Hz'$  '500Hz'$  '1000Hz'$  '2000Hz'$  '3000Hz'$  '4000Hz'$  '6000Hz'$  '8000Hz'$   Right ear:   0 0 40  0    Left ear:   40 0 0  25    Comments: Wears Glasses-Has yearly eye exam scheduled for 03/11/2019 at [facility].  Diabetes:  Good control on no medication. Lab Results  Component Value Date   HGBA1C 5.8 02/18/2019  Using medications without difficulties: Hypoglycemic episodes: Hyperglycemic episodes: Feet problems:no ulcers Blood Sugars averaging: not checking eye exam within last year: due.. schedule on 11/17  Hypertension:    Good control on ramipril. BP Readings from Last 3 Encounters:  02/28/19 110/60  10/28/18 126/74  10/11/18 131/74  Using medication without problems or lightheadedness: none Chest pain with exertion:none Edema:none Short of breath:none Average home BPs: Other issues:   COVID 19 screen No recent travel or known exposure to [place] The patient denies respiratory symptoms of COVID 19 at this time.  The importance of social distancing was discussed today.   Review of Systems  Constitutional: Negative  for chills and fever.  HENT: Negative for congestion and ear pain.   Eyes: Negative for pain and redness.  Respiratory: Negative for cough and shortness of breath.   Cardiovascular: Negative for chest pain, palpitations and leg swelling.  Gastrointestinal: Positive for abdominal pain and constipation. Negative for blood in stool, diarrhea, nausea and vomiting.  Genitourinary: Negative for dysuria.  Musculoskeletal: Negative for falls and myalgias.  Skin: Negative for rash.  Neurological:  Negative for dizziness.  Psychiatric/Behavioral: Negative for depression. The patient is not nervous/anxious.       Past Medical History:  Diagnosis Date  . Acute pharyngitis   . Acute sinusitis, unspecified   . Acute upper respiratory infections of unspecified site   . Allergy   . Anal and rectal polyp   . Cataract    History of  . Cavernous angioma   . Degenerative cervical disc 02/2006  . Diabetes mellitus    Type II  . GERD (gastroesophageal reflux disease)   . Gout   . Hemangioma of unspecified site   . Hyperlipidemia   . Hypertension   . Orthostatic hypotension 04/2006   Hospital  . Osteoarthritis   . Osteopenia   . Other abscess of vulva   . Palpitations   . Syncope and collapse    1 episode    reports that she has quit smoking. She has never used smokeless tobacco. She reports current alcohol use. She reports that she does not use drugs.   Current Outpatient Medications:  .  ACCU-CHEK AVIVA PLUS test strip, USE TO CHECK BLOOD SUGAR DAILY AS NEEDED. DX: E11.9, Disp: 100 each, Rfl: 3 .  acetaminophen (TYLENOL) 325 MG tablet, Take 650 mg by mouth 2 (two) times daily as needed. , Disp: , Rfl:  .  aspirin EC 81 MG tablet, Take 81 mg by mouth at bedtime. , Disp: , Rfl:  .  Blood Glucose Monitoring Suppl (ACCU-CHEK GUIDE) w/Device KIT, 1 each by Does not apply route daily. Use to check blood sugar daily as needed.  Dx: E11.9, Disp: 1 kit, Rfl: 0 .  fish oil-omega-3 fatty acids 1000 MG capsule, Take 1 g by mouth daily.  , Disp: , Rfl:  .  fluticasone (FLONASE) 50 MCG/ACT nasal spray, SPRAY 2 SPRAYS INTO EACH NOSTRIL EVERY DAY, Disp: 16 g, Rfl: 3 .  Lancets (FREESTYLE) lancets, CHECK BLOOD SUGAR 1-2 TIMES A DAY. DX: E11.9 NON INSULIN DEPENDENT, Disp: 100 each, Rfl: 3 .  Multiple Vitamin (MULTIVITAMIN WITH MINERALS) TABS tablet, Take 1 tablet by mouth daily., Disp: , Rfl:  .  omeprazole (PRILOSEC) 40 MG capsule, Take 1 capsule (40 mg total) by mouth daily., Disp: 30 capsule,  Rfl: 3 .  Polyethyl Glycol-Propyl Glycol (SYSTANE OP), Place 1 drop into both eyes daily as needed (for dry eyes). , Disp: , Rfl:  .  polyethylene glycol (MIRALAX / GLYCOLAX) packet, Take 17 g by mouth daily as needed. , Disp: , Rfl:  .  Probiotic Product (ALIGN PO), Take by mouth., Disp: , Rfl:  .  ramipril (ALTACE) 5 MG capsule, TAKE 1 CAPSULE (5 MG TOTAL) BY MOUTH DAILY., Disp: 90 capsule, Rfl: 1 .  triamcinolone lotion (KENALOG) 0.1 %, , Disp: , Rfl:    Observations/Objective: Blood pressure 110/60, pulse 88, temperature 98.7 F (37.1 C), temperature source Temporal, height '5\' 3"'$  (1.6 m), weight 117 lb 8 oz (53.3 kg), SpO2 95 %.  Physical Exam Constitutional:      General: She is not in acute distress.  Appearance: Normal appearance. She is well-developed. She is not ill-appearing or toxic-appearing.  HENT:     Head: Normocephalic.     Right Ear: Hearing, tympanic membrane, ear canal and external ear normal.     Left Ear: Hearing, tympanic membrane, ear canal and external ear normal.     Nose: Nose normal.  Eyes:     General: Lids are normal. Lids are everted, no foreign bodies appreciated.     Conjunctiva/sclera: Conjunctivae normal.     Pupils: Pupils are equal, round, and reactive to light.  Neck:     Musculoskeletal: Normal range of motion and neck supple.     Thyroid: No thyroid mass or thyromegaly.     Vascular: No carotid bruit.     Trachea: Trachea normal.  Cardiovascular:     Rate and Rhythm: Normal rate and regular rhythm.     Heart sounds: Normal heart sounds, S1 normal and S2 normal. No murmur. No gallop.   Pulmonary:     Effort: Pulmonary effort is normal. No respiratory distress.     Breath sounds: Normal breath sounds. No wheezing, rhonchi or rales.  Abdominal:     General: Bowel sounds are normal. There is no distension or abdominal bruit.     Palpations: Abdomen is soft. There is no fluid wave or mass.     Tenderness: There is abdominal tenderness in the  epigastric area and left upper quadrant. There is no right CVA tenderness, left CVA tenderness, guarding or rebound.     Hernia: No hernia is present.  Lymphadenopathy:     Cervical: No cervical adenopathy.  Skin:    General: Skin is warm and dry.     Findings: No rash.  Neurological:     Mental Status: She is alert.     Cranial Nerves: No cranial nerve deficit.     Sensory: No sensory deficit.  Psychiatric:        Mood and Affect: Mood is not anxious or depressed.        Speech: Speech normal.        Behavior: Behavior normal. Behavior is cooperative.        Judgment: Judgment normal.       Diabetic foot exam: Normal inspection No skin breakdown No calluses 20 Normal DP pulses Normal sensation to light touch and monofilament Nails normal  Assessment and Plan  The patient's preventative maintenance and recommended screening tests for an annual wellness exam were reviewed in full today. Brought up to date unless services declined.  Counselled on the importance of diet, exercise, and its role in overall health and mortality. The patient's FH and SH was reviewed, including their home life, tobacco status, and drug and alcohol status.    Due for eye exam.  Uptodate with vaccines  DEXA 2016 osteoporosis.Marland Kitchen  On prolia since 11/2015.. given COVID will plan next year.  Other preventative items reviewed and not indicated.  Controlled type 2 diabetes mellitus without complication, without long-term current use of insulin (HCC) Diet controlled.  Essential hypertension, benign Well controlled. Continue current medication.   Osteoporosis On prolia.Marland Kitchen she wishes to hold off on DEXA at this time given COVID.  Constipation, chronic Encouraged miralax daily, increase water and restart fiber.  Consider trial of linzess if not improving.  Epigastric abdominal pain NML CMET. Eval with cbc and lipase.  Family history of pancreatic cancer. In son. Most liekly gastritis/GERD.Marland Kitchen now back  on PPI daily. Iif not better consider further eval.  Kjell Brannen, MD   

## 2019-02-28 NOTE — Patient Instructions (Addendum)
Continue increased water.  Start back on fiber supplement and try miralax everyday.  Stay on omeprazole 40 mg daily for at least 2 weeks.. call if not improving as expected.   Please stop at the lab to have labs drawn.

## 2019-03-01 LAB — CBC WITH DIFFERENTIAL/PLATELET
Absolute Monocytes: 1006 cells/uL — ABNORMAL HIGH (ref 200–950)
Basophils Absolute: 39 cells/uL (ref 0–200)
Basophils Relative: 0.3 %
Eosinophils Absolute: 116 cells/uL (ref 15–500)
Eosinophils Relative: 0.9 %
HCT: 35.6 % (ref 35.0–45.0)
Hemoglobin: 12.1 g/dL (ref 11.7–15.5)
Lymphs Abs: 1845 cells/uL (ref 850–3900)
MCH: 32.1 pg (ref 27.0–33.0)
MCHC: 34 g/dL (ref 32.0–36.0)
MCV: 94.4 fL (ref 80.0–100.0)
MPV: 9.9 fL (ref 7.5–12.5)
Monocytes Relative: 7.8 %
Neutro Abs: 9894 cells/uL — ABNORMAL HIGH (ref 1500–7800)
Neutrophils Relative %: 76.7 %
Platelets: 270 10*3/uL (ref 140–400)
RBC: 3.77 10*6/uL — ABNORMAL LOW (ref 3.80–5.10)
RDW: 12.8 % (ref 11.0–15.0)
Total Lymphocyte: 14.3 %
WBC: 12.9 10*3/uL — ABNORMAL HIGH (ref 3.8–10.8)

## 2019-03-01 LAB — LIPASE: Lipase: 36 U/L (ref 7–60)

## 2019-03-03 ENCOUNTER — Telehealth: Payer: Self-pay | Admitting: *Deleted

## 2019-03-03 NOTE — Telephone Encounter (Signed)
Patient left a voicemail stating that she saw Dr. Diona Browner Friday for left side pain. Patient stated that she had been taking miralax daily. Patient stated that she was advised to add to the Miralax, fiber. Patient stated the the instructions on the fiber shows to use one teaspoon of powder to a glass of water three times a day. Patient stated that she took the fiber powder yesterday and she is no longer having the side pains Patent stated that she now is having some diarrhea. Patient wants to know if she should adjust the miralax and the fiber since now she is having diarrhea? Patient requested a call back regarding this.

## 2019-03-03 NOTE — Telephone Encounter (Signed)
Decrease dose  Of fiber down to once per day and miralax only if one day without a BM. Please see lab note.. make sure pt without other signs of infection like fever, vomiting..etc.

## 2019-03-04 NOTE — Telephone Encounter (Signed)
[name] notified as instructed by telephone.  Patient states understanding.  See result note for other symptoms.

## 2019-03-11 DIAGNOSIS — H35363 Drusen (degenerative) of macula, bilateral: Secondary | ICD-10-CM | POA: Diagnosis not present

## 2019-03-11 DIAGNOSIS — E119 Type 2 diabetes mellitus without complications: Secondary | ICD-10-CM | POA: Diagnosis not present

## 2019-03-11 DIAGNOSIS — H35033 Hypertensive retinopathy, bilateral: Secondary | ICD-10-CM | POA: Diagnosis not present

## 2019-03-11 DIAGNOSIS — H33322 Round hole, left eye: Secondary | ICD-10-CM | POA: Diagnosis not present

## 2019-03-11 LAB — HM DIABETES EYE EXAM

## 2019-03-17 ENCOUNTER — Encounter: Payer: Self-pay | Admitting: Family Medicine

## 2019-03-18 ENCOUNTER — Ambulatory Visit (INDEPENDENT_AMBULATORY_CARE_PROVIDER_SITE_OTHER): Payer: Medicare Other | Admitting: *Deleted

## 2019-03-18 DIAGNOSIS — M810 Age-related osteoporosis without current pathological fracture: Secondary | ICD-10-CM

## 2019-03-18 MED ORDER — DENOSUMAB 60 MG/ML ~~LOC~~ SOSY
60.0000 mg | PREFILLED_SYRINGE | Freq: Once | SUBCUTANEOUS | Status: AC
  Administered 2019-03-18: 60 mg via SUBCUTANEOUS

## 2019-03-18 NOTE — Progress Notes (Signed)
Per orders of Dr.Cody, injection of Prolia given by Lauralyn Primes. Patient tolerated injection well.

## 2019-03-22 ENCOUNTER — Other Ambulatory Visit: Payer: Self-pay

## 2019-03-22 ENCOUNTER — Encounter (HOSPITAL_COMMUNITY): Payer: Self-pay | Admitting: Emergency Medicine

## 2019-03-22 ENCOUNTER — Emergency Department (HOSPITAL_COMMUNITY)
Admission: EM | Admit: 2019-03-22 | Discharge: 2019-03-22 | Disposition: A | Discharge status: Home / Self Care | Payer: Medicare Other | Attending: Emergency Medicine | Admitting: Emergency Medicine

## 2019-03-22 DIAGNOSIS — Z8673 Personal history of transient ischemic attack (TIA), and cerebral infarction without residual deficits: Secondary | ICD-10-CM | POA: Diagnosis not present

## 2019-03-22 DIAGNOSIS — R42 Dizziness and giddiness: Secondary | ICD-10-CM | POA: Diagnosis present

## 2019-03-22 DIAGNOSIS — E119 Type 2 diabetes mellitus without complications: Secondary | ICD-10-CM | POA: Diagnosis not present

## 2019-03-22 DIAGNOSIS — R55 Syncope and collapse: Secondary | ICD-10-CM | POA: Diagnosis not present

## 2019-03-22 DIAGNOSIS — I10 Essential (primary) hypertension: Secondary | ICD-10-CM | POA: Insufficient documentation

## 2019-03-22 DIAGNOSIS — Z7982 Long term (current) use of aspirin: Secondary | ICD-10-CM | POA: Insufficient documentation

## 2019-03-22 DIAGNOSIS — Z79899 Other long term (current) drug therapy: Secondary | ICD-10-CM | POA: Insufficient documentation

## 2019-03-22 LAB — CBC
HCT: 38.2 % (ref 36.0–46.0)
Hemoglobin: 12.5 g/dL (ref 12.0–15.0)
MCH: 31.8 pg (ref 26.0–34.0)
MCHC: 32.7 g/dL (ref 30.0–36.0)
MCV: 97.2 fL (ref 80.0–100.0)
Platelets: 283 10*3/uL (ref 150–400)
RBC: 3.93 MIL/uL (ref 3.87–5.11)
RDW: 13.4 % (ref 11.5–15.5)
WBC: 14.6 10*3/uL — ABNORMAL HIGH (ref 4.0–10.5)
nRBC: 0 % (ref 0.0–0.2)

## 2019-03-22 LAB — BASIC METABOLIC PANEL
Anion gap: 11 (ref 5–15)
BUN: 25 mg/dL — ABNORMAL HIGH (ref 8–23)
CO2: 20 mmol/L — ABNORMAL LOW (ref 22–32)
Calcium: 8.8 mg/dL — ABNORMAL LOW (ref 8.9–10.3)
Chloride: 103 mmol/L (ref 98–111)
Creatinine, Ser: 0.97 mg/dL (ref 0.44–1.00)
GFR calc Af Amer: 58 mL/min — ABNORMAL LOW (ref >60)
GFR calc non Af Amer: 50 mL/min — ABNORMAL LOW (ref >60)
Glucose, Bld: 85 mg/dL (ref 70–99)
Potassium: 4.4 mmol/L (ref 3.5–5.1)
Sodium: 134 mmol/L — ABNORMAL LOW (ref 135–145)

## 2019-03-22 LAB — URINALYSIS, ROUTINE W REFLEX MICROSCOPIC
Bilirubin Urine: NEGATIVE
Glucose, UA: NEGATIVE mg/dL
Hgb urine dipstick: NEGATIVE
Ketones, ur: 5 mg/dL — AB
Leukocytes,Ua: NEGATIVE
Nitrite: NEGATIVE
Protein, ur: NEGATIVE mg/dL
Specific Gravity, Urine: 1.02 (ref 1.005–1.030)
pH: 5 (ref 5.0–8.0)

## 2019-03-22 LAB — TROPONIN I (HIGH SENSITIVITY): Troponin I (High Sensitivity): 6 ng/L (ref <18)

## 2019-03-22 LAB — CBG MONITORING, ED: Glucose-Capillary: 86 mg/dL (ref 70–99)

## 2019-03-22 MED ORDER — SODIUM CHLORIDE 0.9 % IV BOLUS
500.0000 mL | Freq: Once | INTRAVENOUS | Status: AC
  Administered 2019-03-22: 14:00:00 500 mL via INTRAVENOUS

## 2019-03-22 MED ORDER — SODIUM CHLORIDE 0.9% FLUSH
3.0000 mL | Freq: Once | INTRAVENOUS | Status: AC
  Administered 2019-03-22: 3 mL via INTRAVENOUS

## 2019-03-22 NOTE — Discharge Instructions (Signed)
Eat and drink well for the next couple days.  Return for worsening symptoms, chest pain, shortness of breath.

## 2019-03-22 NOTE — ED Notes (Signed)
Patient verbalizes understanding of discharge instructions . Opportunity for questions and answers were provided . Armband removed by staff ,Pt discharged from ED. W/C  offered at D/C  and Declined W/C at D/C and was escorted to lobby by RN.  

## 2019-03-22 NOTE — ED Triage Notes (Signed)
PT ambulated to BR with cane tol well

## 2019-03-22 NOTE — ED Notes (Signed)
Patient ambulated to bathroom when we got specimen. She was able to walk to and from the bathroom and back to her room on her own.

## 2019-03-22 NOTE — ED Triage Notes (Addendum)
Pt states she has had several episodes of dizziness with near syncopal events throughout the week. States when this happen she feels her heart racing and feels weak all over. Denies fevers. Does not feel weak or dizzy currently.

## 2019-03-22 NOTE — ED Provider Notes (Signed)
[facility] EMERGENCY DEPARTMENT Provider Note   CSN: [id number] Arrival date & time: 03/22/19  1302     History   Chief Complaint Chief Complaint  Patient presents with   Dizziness    HPI [name] is a 85 y.o. female.     85 yo F with a chief complaints of feeling dizzy.  Patient had multiple events this morning.  Has had events off and on all week.  States that they have been occurring mostly when she gets up out of bed in the morning.  She would sit up and feel lightheaded and then lay back down.  Describes the feeling as a sensation that she may pass out.  Has never actually passed out with this.  Does feel like she is having palpitations when it occurs.  Denies chest pain or shortness of breath denies headache or neck pain.  Denies cough congestion fever denies decreased oral intake denies vomiting diarrhea.  Denies dark stool or blood in her stool.  States has been eating and drinking normally.  No worsening with head movement.  She became concerned today because she had multiple episodes.  Started while she was sitting at her desk shredding some paperwork.  Felt like it lasted a bit longer than it had earlier in the week.  She called her daughter and asked her to come and take her to the hospital.  In the time it took her daughter to arrive the patient symptoms have completely resolved.  She no longer feels lightheaded.  Denies any continued palpitations.  The history is provided by the patient.  Dizziness Quality:  Lightheadedness Severity:  Moderate Onset quality:  Gradual Duration:  1 week Timing:  Constant Progression:  Worsening Chronicity:  New Context: standing up   Relieved by:  Lying down Worsened by:  Nothing Ineffective treatments:  None tried Associated symptoms: no chest pain, no headaches, no nausea, no palpitations, no shortness of breath and no vomiting     Past Medical History:  Diagnosis Date   Acute pharyngitis    Acute  sinusitis, unspecified    Acute upper respiratory infections of unspecified site    Allergy    Anal and rectal polyp    Cataract    History of   Cavernous angioma    Degenerative cervical disc 02/2006   Diabetes mellitus    Type II   GERD (gastroesophageal reflux disease)    Gout    Hemangioma of unspecified site    Hyperlipidemia    Hypertension    Orthostatic hypotension 04/2006   Hospital   Osteoarthritis    Osteopenia    Other abscess of vulva    Palpitations    Syncope and collapse    1 episode    Patient Active Problem List   Diagnosis Date Noted   Seborrheic keratoses 10/16/2017   Hyponatremia 09/14/2017   Left upper quadrant pain 08/07/2017   Right hip pain 07/12/2017   Hearing loss 02/20/2017   TIA (transient ischemic attack), possible 11/14/2016   Left carpal tunnel syndrome 08/11/2016   Epigastric abdominal pain 06/15/2016   Constipation, chronic 03/21/2016   Near syncope 05/07/2015   Osteoarthritis of right shoulder 05/04/2015   Counseling regarding end of life decision making 01/15/2015   Skin lesion of right leg 07/10/2014   Insomnia 04/10/2014   Osteoarthritis of right knee 10/14/2013   Osteoarthritis of left knee 10/14/2013   Infected sebaceous cyst 11/29/2012   Gout, chronic, without tophus 12/13/2009  Osteoporosis 10/29/2009   PALPITATIONS, OCCASIONAL 07/07/2008   Allergic rhinitis 06/02/2008   HEMANGIOMA 07/30/2006   Controlled type 2 diabetes mellitus without complication, without long-term current use of insulin ([facility]) 07/30/2006   Hyperlipidemia LDL goal <100 07/30/2006   Essential hypertension, benign 07/30/2006   GERD 07/30/2006   RECTAL POLYPS 07/30/2006   OSTEOARTHRITIS 07/30/2006   CATARACT, HX OF 07/30/2006    Past Surgical History:  Procedure Laterality Date   ABDOMINAL HYSTERECTOMY     Aortic dopplers     Mild plaque.  EEG okay   CATARACT EXTRACTION     Cavernous angioma       Cavernous hemangioma  04/2004   Hospital   TOTAL KNEE ARTHROPLASTY     Right     OB History   No obstetric history on file.      Home Medications    Prior to Admission medications   Medication Sig Start Date End Date Taking? Authorizing Provider  ACCU-CHEK AVIVA PLUS test strip USE TO CHECK BLOOD SUGAR DAILY AS NEEDED. DX: E11.9 06/17/18   Bedsole, Amy E, MD  acetaminophen (TYLENOL) 325 MG tablet Take 650 mg by mouth 2 (two) times daily as needed.     [provider]  aspirin EC 81 MG tablet Take 81 mg by mouth at bedtime.     [provider]  Blood Glucose Monitoring Suppl (ACCU-CHEK GUIDE) w/Device KIT 1 each by Does not apply route daily. Use to check blood sugar daily as needed.  Dx: E11.9 04/30/18   Bedsole, Mervyn Gay, MD  fish oil-omega-3 fatty acids 1000 MG capsule Take 1 g by mouth daily.      [provider]  fluticasone (FLONASE) 50 MCG/ACT nasal spray SPRAY 2 SPRAYS INTO EACH NOSTRIL EVERY DAY 04/09/18   Bedsole, Amy E, MD  Lancets (FREESTYLE) lancets CHECK BLOOD SUGAR 1-2 TIMES A DAY. DX: E11.9 NON INSULIN DEPENDENT 08/05/18   [name], MD  Multiple Vitamin (MULTIVITAMIN WITH MINERALS) TABS tablet Take 1 tablet by mouth daily.    [provider]  omeprazole (PRILOSEC) 40 MG capsule Take 1 capsule (40 mg total) by mouth daily. 10/16/18   Bedsole, Amy E, MD  Polyethyl Glycol-Propyl Glycol (SYSTANE OP) Place 1 drop into both eyes daily as needed (for dry eyes).     [provider]  polyethylene glycol (MIRALAX / GLYCOLAX) packet Take 17 g by mouth daily as needed.     [provider]  Probiotic Product (ALIGN PO) Take by mouth.    [provider]  ramipril (ALTACE) 5 MG capsule TAKE 1 CAPSULE (5 MG TOTAL) BY MOUTH DAILY. 10/07/18   [name], MD  triamcinolone lotion (KENALOG) 0.1 %  06/06/18   [provider]    Family History Family History  Problem Relation Age of Onset   Heart disease Other         CHF   Cancer Maternal Aunt        Breast CA    Social History Social History   Tobacco Use   Smoking status: Former Smoker   Smokeless tobacco: Never Used   Tobacco comment: quit 1970  Substance Use Topics   Alcohol use: Yes    Alcohol/week: 0.0 standard drinks    Comment: wine rarely   Drug use: No     Allergies   Codeine and Statins   Review of Systems Review of Systems  Constitutional: Negative for chills and fever.  HENT: Negative for congestion and  rhinorrhea.   Eyes: Negative for redness and visual disturbance.  Respiratory: Negative for shortness of breath and wheezing.   Cardiovascular: Negative for chest pain and palpitations.  Gastrointestinal: Negative for nausea and vomiting.  Genitourinary: Negative for dysuria and urgency.  Musculoskeletal: Negative for arthralgias and myalgias.  Skin: Negative for pallor and wound.  Neurological: Positive for dizziness. Negative for headaches.     Physical Exam Updated Vital Signs BP (!) 174/67    Pulse 67    Temp 97.9 F (36.6 C) (Oral)    Resp 19    SpO2 99%   Physical Exam Vitals signs and nursing note reviewed.  Constitutional:      General: She is not in acute distress.    Appearance: She is well-developed. She is not diaphoretic.  HENT:     Head: Normocephalic and atraumatic.  Eyes:     Pupils: Pupils are equal, round, and reactive to light.  Neck:     Musculoskeletal: Normal range of motion and neck supple.  Cardiovascular:     Rate and Rhythm: Normal rate and regular rhythm.     Heart sounds: No murmur. No friction rub. No gallop.   Pulmonary:     Effort: Pulmonary effort is normal.     Breath sounds: No wheezing or rales.  Abdominal:     General: There is no distension.     Palpations: Abdomen is soft.     Tenderness: There is no abdominal tenderness.  Musculoskeletal:        General: No tenderness.  Skin:    General: Skin is warm and dry.  Neurological:     Mental Status: She  is alert and oriented to person, place, and time.     Cranial Nerves: Cranial nerves are intact.     Sensory: Sensation is intact.     Motor: Motor function is intact.     Coordination: Coordination is intact.     Comments: Benign neuro exam  Psychiatric:        Behavior: Behavior normal.      ED Treatments / Results  Labs (all labs ordered are listed, but only abnormal results are displayed) Labs Reviewed  BASIC METABOLIC PANEL - Abnormal; Notable for the following components:      Result Value   Sodium 134 (*)    CO2 20 (*)    BUN 25 (*)    Calcium 8.8 (*)    GFR calc non Af Amer 50 (*)    GFR calc Af Amer 58 (*)    All other components within normal limits  CBC - Abnormal; Notable for the following components:   WBC 14.6 (*)    All other components within normal limits  URINALYSIS, ROUTINE W REFLEX MICROSCOPIC - Abnormal; Notable for the following components:   APPearance HAZY (*)    Ketones, ur 5 (*)    All other components within normal limits  CBG MONITORING, ED  TROPONIN I (HIGH SENSITIVITY)    EKG EKG Interpretation  Date/Time:  Saturday March 22 2019 13:18:35 EST Ventricular Rate:  74 PR Interval:  156 QRS Duration: 76 QT Interval:  396 QTC Calculation: 439 R Axis:   -62 Text Interpretation: Normal sinus rhythm Left axis deviation Minimal voltage criteria for LVH, may be normal variant ( R in aVL ) Inferior infarct , age undetermined Anterior infarct , age undetermined Abnormal ECG No significant change since last tracing Confirmed by Deno Etienne [phone]) on 03/22/2019 1:29:05 PM   Radiology No results  found.  Procedures Procedures (including critical care time)  Medications Ordered in ED Medications  sodium chloride flush (NS) 0.9 % injection 3 mL (3 mLs Intravenous Given 03/22/19 1416)  sodium chloride 0.9 % bolus 500 mL (500 mLs Intravenous New Bag/Given 03/22/19 1416)     Initial Impression / Assessment and Plan / ED Course  I have reviewed  the triage vital signs and the nursing notes.  Pertinent labs & imaging results that were available during my care of the patient were reviewed by me and considered in my medical decision making (see chart for details).        85 yo F with a chief complaints of feeling like she may pass out.  Has been occurring throughout the week.  Usually worse with position changes make me feel it is most likely orthostasis.  Her symptoms have resolved on arrival.  Will obtain a laboratory evaluation.  Keep the patient on telemetry.  Bolus of IV fluids and reassess.  Patient continues to feel well.  No dizziness.  Hgb normal, no acute renal dysfunction.  Ketones in urine with possible dehydration.  Attempt to ambulate here.   Ambulated independently.  D/c home.   3:18 PM:  I have discussed the diagnosis/risks/treatment options with the patient and believe the pt to be eligible for discharge home to follow-up with PCP. We also discussed returning to the ED immediately if new or worsening sx occur. We discussed the sx which are most concerning (e.g., sudden worsening pain, fever, inability to tolerate by mouth) that necessitate immediate return. Medications administered to the patient during their visit and any new prescriptions provided to the patient are listed below.  Medications given during this visit Medications  sodium chloride flush (NS) 0.9 % injection 3 mL (3 mLs Intravenous Given 03/22/19 1416)  sodium chloride 0.9 % bolus 500 mL (500 mLs Intravenous New Bag/Given 03/22/19 1416)     The patient appears reasonably screen and/or stabilized for discharge and I doubt any other medical condition or other [facility] requiring further screening, evaluation, or treatment in the ED at this time prior to discharge.    Final Clinical Impressions(s) / ED Diagnoses   Final diagnoses:  Near syncope    ED Discharge Orders    None       Deno Etienne, DO 03/22/19 1518

## 2019-03-24 ENCOUNTER — Telehealth: Payer: Self-pay

## 2019-03-24 NOTE — Telephone Encounter (Signed)
Per chart review tab pt did go to [facility] ED on 03/22/19.

## 2019-03-24 NOTE — Telephone Encounter (Signed)
[facility] Night - Client TELEPHONE ADVICE RECORD [facility] Patient Name: [name] Gender: Female DOB: [birth year] Age: 85 Y 2 M 11 D Return Phone Number: [phone] (Primary) Address: City/State/Zip: [place] [address] Client [facility] Night - Client Client Site [place] Physician Eliezer Lofts - MD Contact Type Call Who Is Calling Patient / Member / Family / Caregiver Call Type Triage / Clinical Relationship To Patient Self Return Phone Number [phone] (Primary) Chief Complaint Dizziness Reason for Call Symptomatic / Request for Health Information Initial Comment Feeling dizzy Translation No Nurse Assessment Nurse: Merry Lofty, RN, Lattie Haw Date/Time [name] Time): 03/22/2019 11:44:46 AM Confirm and document reason for call. If symptomatic, describe symptoms. ---Caller states she is feeling dizzy. States she has had 2-3 episodes of dizziness this week and the dizziness is worse this morning. States she is able to stand up and walk. Denies fever. Has the patient had close contact with a person known or suspected to have the novel coronavirus illness OR traveled / lives in area with major community spread (including international travel) in the last 14 days from the onset of symptoms? * If Asymptomatic, screen for exposure and travel within the last 14 days. ---No Does the patient have any new or worsening symptoms? ---Yes Will a triage be completed? ---Yes Related visit to physician within the last 2 weeks? ---No Does the PT have any chronic conditions? (i.e. diabetes, asthma, this includes High risk factors for pregnancy, etc.) ---Yes List chronic conditions. ---Diabetes - on oral medications (sugars have been good), HTN Is this a behavioral health or substance abuse call? ---No Guidelines Guideline Title Affirmed Question Affirmed Notes Nurse Date/Time (Eastern Time) Dizziness  - Lightheadedness SEVERE dizziness (e.g., unable to stand, requires support to walk, feels like passing out now) [place], Therapist, sports, Lattie Haw 03/22/2019 11:49:09 AM Disp. Time [name] Time) Disposition Final User 03/22/2019 11:54:13 AM Go to ED Now (or PCP triage) Yes Merry Lofty, RN, Lattie Haw PLEASE NOTE: All timestamps contained within this report are represented as [place] Standard Time. CONFIDENTIALTY NOTICE: This fax transmission is intended only for the addressee. It contains information that is legally privileged, confidential or otherwise protected from use or disclosure. If you are not the intended recipient, you are strictly prohibited from reviewing, disclosing, copying using or disseminating any of this information or taking any action in reliance on or regarding this information. If you have received this fax in error, please notify us immediately by telephone so that we can arrange for its return to [place]. Phone: [phone], Toll-Free: [phone], Fax: [phone] Page: 2 of 2 Call Id: [id number] [place] Disagree/Comply Comply Caller Understands Yes PreDisposition Did not know what to do Care Advice Given Per Guideline GO TO ED NOW (OR PCP TRIAGE): * IF NO PCP (PRIMARY CARE PROVIDER) SECOND-LEVEL TRIAGE: You need to be seen within the next hour. Go to the [facility] at _____________ [place] as soon as you can. ANOTHER ADULT SHOULD DRIVE: CARE ADVICE given per Dizziness (Adult) guideline. BRING MEDICINES: * Please bring a list of your current medicines when you go to the Emergency Department (ER). Referrals GO TO FACILITY UNDECIDED

## 2019-03-27 ENCOUNTER — Other Ambulatory Visit: Payer: Self-pay

## 2019-03-27 ENCOUNTER — Emergency Department (HOSPITAL_COMMUNITY)
Admission: EM | Admit: 2019-03-27 | Discharge: 2019-03-28 | Disposition: A | Discharge status: Home / Self Care | Payer: Medicare Other | Attending: Emergency Medicine | Admitting: Emergency Medicine

## 2019-03-27 ENCOUNTER — Telehealth: Payer: Self-pay

## 2019-03-27 ENCOUNTER — Emergency Department (HOSPITAL_COMMUNITY): Payer: Medicare Other

## 2019-03-27 DIAGNOSIS — Z7982 Long term (current) use of aspirin: Secondary | ICD-10-CM | POA: Insufficient documentation

## 2019-03-27 DIAGNOSIS — R42 Dizziness and giddiness: Secondary | ICD-10-CM | POA: Insufficient documentation

## 2019-03-27 DIAGNOSIS — E860 Dehydration: Secondary | ICD-10-CM | POA: Diagnosis not present

## 2019-03-27 DIAGNOSIS — E119 Type 2 diabetes mellitus without complications: Secondary | ICD-10-CM | POA: Insufficient documentation

## 2019-03-27 DIAGNOSIS — R531 Weakness: Secondary | ICD-10-CM | POA: Diagnosis not present

## 2019-03-27 DIAGNOSIS — I10 Essential (primary) hypertension: Secondary | ICD-10-CM | POA: Diagnosis not present

## 2019-03-27 DIAGNOSIS — Z87891 Personal history of nicotine dependence: Secondary | ICD-10-CM | POA: Insufficient documentation

## 2019-03-27 DIAGNOSIS — R0902 Hypoxemia: Secondary | ICD-10-CM | POA: Diagnosis not present

## 2019-03-27 DIAGNOSIS — Z79899 Other long term (current) drug therapy: Secondary | ICD-10-CM | POA: Insufficient documentation

## 2019-03-27 LAB — CBC WITH DIFFERENTIAL/PLATELET
Abs Immature Granulocytes: 0.03 10*3/uL (ref 0.00–0.07)
Basophils Absolute: 0 10*3/uL (ref 0.0–0.1)
Basophils Relative: 0 %
Eosinophils Absolute: 0.1 10*3/uL (ref 0.0–0.5)
Eosinophils Relative: 1 %
HCT: 37.4 % (ref 36.0–46.0)
Hemoglobin: 12.4 g/dL (ref 12.0–15.0)
Immature Granulocytes: 0 %
Lymphocytes Relative: 16 %
Lymphs Abs: 1.6 10*3/uL (ref 0.7–4.0)
MCH: 31.8 pg (ref 26.0–34.0)
MCHC: 33.2 g/dL (ref 30.0–36.0)
MCV: 95.9 fL (ref 80.0–100.0)
Monocytes Absolute: 0.8 10*3/uL (ref 0.1–1.0)
Monocytes Relative: 7 %
Neutro Abs: 8 10*3/uL — ABNORMAL HIGH (ref 1.7–7.7)
Neutrophils Relative %: 76 %
Platelets: 273 10*3/uL (ref 150–400)
RBC: 3.9 MIL/uL (ref 3.87–5.11)
RDW: 13.3 % (ref 11.5–15.5)
WBC: 10.5 10*3/uL (ref 4.0–10.5)
nRBC: 0 % (ref 0.0–0.2)

## 2019-03-27 LAB — COMPREHENSIVE METABOLIC PANEL
ALT: 20 U/L (ref 0–44)
AST: 23 U/L (ref 15–41)
Albumin: 3.5 g/dL (ref 3.5–5.0)
Alkaline Phosphatase: 46 U/L (ref 38–126)
Anion gap: 9 (ref 5–15)
BUN: 24 mg/dL — ABNORMAL HIGH (ref 8–23)
CO2: 24 mmol/L (ref 22–32)
Calcium: 9.4 mg/dL (ref 8.9–10.3)
Chloride: 101 mmol/L (ref 98–111)
Creatinine, Ser: 1.03 mg/dL — ABNORMAL HIGH (ref 0.44–1.00)
GFR calc Af Amer: 54 mL/min — ABNORMAL LOW (ref >60)
GFR calc non Af Amer: 46 mL/min — ABNORMAL LOW (ref >60)
Glucose, Bld: 87 mg/dL (ref 70–99)
Potassium: 4.3 mmol/L (ref 3.5–5.1)
Sodium: 134 mmol/L — ABNORMAL LOW (ref 135–145)
Total Bilirubin: 0.7 mg/dL (ref 0.3–1.2)
Total Protein: 6.6 g/dL (ref 6.5–8.1)

## 2019-03-27 LAB — URINALYSIS, ROUTINE W REFLEX MICROSCOPIC
Bilirubin Urine: NEGATIVE
Glucose, UA: NEGATIVE mg/dL
Hgb urine dipstick: NEGATIVE
Ketones, ur: NEGATIVE mg/dL
Leukocytes,Ua: NEGATIVE
Nitrite: NEGATIVE
Protein, ur: NEGATIVE mg/dL
Specific Gravity, Urine: 1.009 (ref 1.005–1.030)
pH: 6 (ref 5.0–8.0)

## 2019-03-27 NOTE — ED Triage Notes (Signed)
Pt reports feeling lightheaded just after lunch that has been intermittent.  Denies any at this time

## 2019-03-27 NOTE — ED Provider Notes (Signed)
[facility] EMERGENCY DEPARTMENT Provider Note   CSN: [id number] Arrival date & time: 03/27/19  1537     History   Chief Complaint Chief Complaint  Patient presents with   Dizziness    HPI [name] is a 85 y.o. female.     85 year old female with prior medical history as detailed below presents for evaluation of transient dizziness.  Patient reports that after lunch she had an episode of feeling dizzy and lightheaded.  She did not pass out.  She denies associated chest pain or shortness of breath.  Her symptoms lasted about an hour.  She now feels fine and is back to baseline.  She denies associated fever.  She reports intermittent symptoms similar to today's over the last several weeks.  She was evaluated 4 days ago at this facility for same complaint.  Work-up at that time suggested possible mild dehydration.  The history is provided by the patient, medical records and a relative.  Dizziness Quality:  Lightheadedness Severity:  Mild Onset quality:  Sudden Timing:  Sporadic Progression:  Resolved Chronicity:  New Relieved by:  Nothing Worsened by:  Nothing   Past Medical History:  Diagnosis Date   Acute pharyngitis    Acute sinusitis, unspecified    Acute upper respiratory infections of unspecified site    Allergy    Anal and rectal polyp    Cataract    History of   Cavernous angioma    Degenerative cervical disc 02/2006   Diabetes mellitus    Type II   GERD (gastroesophageal reflux disease)    Gout    Hemangioma of unspecified site    Hyperlipidemia    Hypertension    Orthostatic hypotension 04/2006   Hospital   Osteoarthritis    Osteopenia    Other abscess of vulva    Palpitations    Syncope and collapse    1 episode    Patient Active Problem List   Diagnosis Date Noted   Seborrheic keratoses 10/16/2017   Hyponatremia 09/14/2017   Left upper quadrant pain 08/07/2017   Right hip pain 07/12/2017    Hearing loss 02/20/2017   TIA (transient ischemic attack), possible 11/14/2016   Left carpal tunnel syndrome 08/11/2016   Epigastric abdominal pain 06/15/2016   Constipation, chronic 03/21/2016   Near syncope 05/07/2015   Osteoarthritis of right shoulder 05/04/2015   Counseling regarding end of life decision making 01/15/2015   Skin lesion of right leg 07/10/2014   Insomnia 04/10/2014   Osteoarthritis of right knee 10/14/2013   Osteoarthritis of left knee 10/14/2013   Infected sebaceous cyst 11/29/2012   Gout, chronic, without tophus 12/13/2009   Osteoporosis 10/29/2009   PALPITATIONS, OCCASIONAL 07/07/2008   Allergic rhinitis 06/02/2008   HEMANGIOMA 07/30/2006   Controlled type 2 diabetes mellitus without complication, without long-term current use of insulin ([place]) 07/30/2006   Hyperlipidemia LDL goal <100 07/30/2006   Essential hypertension, benign 07/30/2006   GERD 07/30/2006   RECTAL POLYPS 07/30/2006   OSTEOARTHRITIS 07/30/2006   CATARACT, HX OF 07/30/2006    Past Surgical History:  Procedure Laterality Date   ABDOMINAL HYSTERECTOMY     Aortic dopplers     Mild plaque.  EEG okay   CATARACT EXTRACTION     Cavernous angioma     Cavernous hemangioma  04/2004   Hospital   TOTAL KNEE ARTHROPLASTY     Right     OB History   No obstetric history on file.  Home Medications    Prior to Admission medications   Medication Sig Start Date End Date Taking? Authorizing Provider  ACCU-CHEK AVIVA PLUS test strip USE TO CHECK BLOOD SUGAR DAILY AS NEEDED. DX: E11.9 Patient taking differently: 1 each by Other route as needed (to check BGL). DX. E11.9 06/17/18  Yes Bedsole, Amy E, MD  acetaminophen (TYLENOL) 325 MG tablet Take 650 mg by mouth 2 (two) times daily.    Yes [provider]  aspirin EC 81 MG tablet Take 81 mg by mouth at bedtime.    Yes [provider]  Blood Glucose Monitoring Suppl (ACCU-CHEK GUIDE) w/Device KIT 1  each by Does not apply route daily. Use to check blood sugar daily as needed.  Dx: E11.9 04/30/18  Yes Bedsole, Amy E, MD  Fiber POWD Take 15 mLs by mouth 3 (three) times daily as needed (constipation).   Yes [provider]  fish oil-omega-3 fatty acids 1000 MG capsule Take 1 g by mouth daily.     Yes [provider]  fluticasone (FLONASE) 50 MCG/ACT nasal spray SPRAY 2 SPRAYS INTO EACH NOSTRIL EVERY DAY Patient taking differently: Place 2 sprays into both nostrils daily as needed for allergies.  04/09/18  Yes Bedsole, Amy E, MD  Lancets (FREESTYLE) lancets CHECK BLOOD SUGAR 1-2 TIMES A DAY. DX: E11.9 NON INSULIN DEPENDENT 08/05/18  Yes Bedsole, Amy E, MD  Multiple Vitamin (MULTIVITAMIN WITH MINERALS) TABS tablet Take 1 tablet by mouth daily.   Yes [provider]  omeprazole (PRILOSEC) 40 MG capsule Take 1 capsule (40 mg total) by mouth daily. Patient taking differently: Take 40 mg by mouth daily before breakfast.  10/16/18  Yes Bedsole, Amy E, MD  Polyethyl Glycol-Propyl Glycol (SYSTANE OP) Place 1 drop into both eyes 3 (three) times daily as needed (for dry eyes).    Yes [provider]  polyethylene glycol (MIRALAX / GLYCOLAX) packet Take 17 g by mouth daily as needed for mild constipation.    Yes [provider]  ramipril (ALTACE) 5 MG capsule TAKE 1 CAPSULE (5 MG TOTAL) BY MOUTH DAILY. Patient taking differently: Take 5 mg by mouth daily.  10/07/18  Yes Bedsole, Amy E, MD  triamcinolone lotion (KENALOG) 0.1 % Apply 1 application topically 2 (two) times daily as needed (for itching).  06/06/18  Yes [provider]    Family History Family History  Problem Relation Age of Onset   Heart disease Other        CHF   Cancer Maternal Aunt        Breast CA    Social History Social History   Tobacco Use   Smoking status: Former Smoker   Smokeless tobacco: Never Used   Tobacco comment: quit 1970  Substance Use Topics   Alcohol use:  Yes    Alcohol/week: 0.0 standard drinks    Comment: wine rarely   Drug use: No     Allergies   Codeine and Statins   Review of Systems Review of Systems  Neurological: Positive for dizziness.  All other systems reviewed and are negative.    Physical Exam Updated Vital Signs BP (!) 149/82    Pulse 67    Resp 16    SpO2 99%   Physical Exam Vitals signs and nursing note reviewed.  Constitutional:      General: She is not in acute distress.    Appearance: Normal appearance. She is well-developed.  HENT:     Head: Normocephalic and atraumatic.  Eyes:     Conjunctiva/sclera: Conjunctivae normal.     Pupils: Pupils are equal, round, and reactive to light.  Neck:     Musculoskeletal: Normal range of motion and neck supple.  Cardiovascular:     Rate and Rhythm: Normal rate and regular rhythm.     Heart sounds: Normal heart sounds.  Pulmonary:     Effort: Pulmonary effort is normal. No respiratory distress.     Breath sounds: Normal breath sounds.  Abdominal:     General: There is no distension.     Palpations: Abdomen is soft.     Tenderness: There is no abdominal tenderness.  Musculoskeletal: Normal range of motion.        General: No deformity.  Skin:    General: Skin is warm and dry.  Neurological:     General: No focal deficit present.     Mental Status: She is alert and oriented to person, place, and time. Mental status is at baseline.     Cranial Nerves: No cranial nerve deficit.     Sensory: No sensory deficit.     Motor: No weakness.      ED Treatments / Results  Labs (all labs ordered are listed, but only abnormal results are displayed) Labs Reviewed  URINALYSIS, ROUTINE W REFLEX MICROSCOPIC - Abnormal; Notable for the following components:      Result Value   APPearance HAZY (*)    All other components within normal limits  CBC WITH DIFFERENTIAL/PLATELET - Abnormal; Notable for the following components:   Neutro Abs 8.0 (*)    All other  components within normal limits  COMPREHENSIVE METABOLIC PANEL - Abnormal; Notable for the following components:   Sodium 134 (*)    BUN 24 (*)    Creatinine, Ser 1.03 (*)    GFR calc non Af Amer 46 (*)    GFR calc Af Amer 54 (*)    All other components within normal limits    EKG None  Radiology Ct Head Wo Contrast  Result Date: 03/27/2019 CLINICAL DATA:  Vertigo. EXAM: CT HEAD WITHOUT CONTRAST TECHNIQUE: Contiguous axial images were obtained from the base of the skull through the vertex without intravenous contrast. COMPARISON:  October 07, 2016. FINDINGS: Brain: Mild diffuse cortical atrophy is noted. Mild chronic ischemic white matter disease is noted. No mass effect or midline shift is noted. Ventricular size is within normal limits. There is no evidence of mass lesion, hemorrhage or acute infarction. Vascular: No hyperdense vessel or unexpected calcification. Skull: Normal. Negative for fracture or focal lesion. Sinuses/Orbits: No acute finding. Other: None. IMPRESSION: Mild diffuse cortical atrophy. Mild chronic ischemic white matter disease. No acute intracranial abnormality seen. Electronically Signed   By: Marijo Conception M.D.   On: 03/27/2019 19:10   Dg Chest Port 1 View  Result Date: 03/27/2019 CLINICAL DATA:  Weakness. Lightheadedness. EXAM: PORTABLE CHEST 1 VIEW COMPARISON:  09/13/2017 FINDINGS: The cardiomediastinal contours are unchanged. Atherosclerosis of the thoracic aorta. Multiple skin folds project over the right chest. Mild hyperinflation which is unchanged. Pulmonary vasculature is normal. No consolidation, pleural effusion, or pneumothorax. Chronic degenerative change of both hips. No acute osseous abnormalities are seen. IMPRESSION: 1. No acute abnormality. 2.  Aortic Atherosclerosis (ICD10-I70.0). Electronically Signed   By: Keith Rake M.D.   On: 03/27/2019 16:49    Procedures Procedures (including critical care time)  Medications Ordered in ED Medications -  No data to display   Initial Impression / Assessment and Plan /  ED Course  I have reviewed the triage vital signs and the nursing notes.  Pertinent labs & imaging results that were available during my care of the patient were reviewed by me and considered in my medical decision making (see chart for details).        MDM  Screen complete  [name] was evaluated in Emergency Department on 03/27/2019 for the symptoms described in the history of present illness. She was evaluated in the context of the global COVID-19 pandemic, which necessitated consideration that the patient might be at risk for infection with the SARS-CoV-2 virus that causes COVID-19. Institutional protocols and algorithms that pertain to the evaluation of patients at risk for COVID-19 are in a state of rapid change based on information released by regulatory bodies including the CDC and federal and state organizations. These policies and algorithms were followed during the patient's care in the ED.  Patient is presenting with complaint of transient episode of dizziness.  Symptoms resolved completely prior to evaluation today in the ED.  Screening labs obtained today are without significant abnormality.  Patient's symptoms resolved prior to arrival and did not return during her ED evaluation.  Following her ED evaluation she feels improved and declines admission for further work-up and/or observation.  She desires discharge home.  She does understand the need for close follow-up with your regular care provider tomorrow morning.  Importance of close follow-up was stressed with both the patient and the patient's family at bedside.   Final Clinical Impressions(s) / ED Diagnoses   Final diagnoses:  Dizziness    ED Discharge Orders    None       Valarie Merino, MD 03/27/19 2113

## 2019-03-27 NOTE — ED Notes (Signed)
Patient verbalizes understanding of discharge instructions. Opportunity for questioning and answers were provided. Armband removed by staff, pt discharged from ED.  

## 2019-03-27 NOTE — Telephone Encounter (Signed)
Patient called stating that she is not feeling well. She is having sensation of feeling dizzy and like she is going to pass out off and on like she did on 03/22/2019. She ate about 45 minutes ago (at the start of our conversation) and started to feel bad after eating. She checked her sugar a few minutes ago and it was 175. She is having left side abdominal pain which she states usually hurts like this when she needs to take Miralax, patient states she took it yesterday and today. She has some nausea but no vomiting. Her eye feel funny per patient. I had patient look in the mirror and smile and patient did not notice any assymetry to her face. No dropping of face or eyes or lips. No chest pain or tightness, no SOB, no numbness sensation, no slurred speech, no blurred vision. I asked patient to check her b/p and after 10 minutes patient stated she could not get it to work right and was not sure how to put It on correctly (patient has used b/p machine before but not in a while patient said). Patient has not spoken with her daughter today.   Patient was seen at ER on 03/22/2019 for symptoms of dizziness and feeling like she is going to pass out like she feels now but states after that ER visit she felt fine until today.  Consulted with Dr. Diona Browner and called 911 for EMS to evaluate patient. Patient was going to call 911 herself but was getting confused about what I was telling her. I called EMS myself and relayed all the information I know. FYI to PCP.

## 2019-03-27 NOTE — Telephone Encounter (Signed)
Agree.. pt with acute confusion, speaking slowly.. not normal for her. Noone else present to assess.  EMS appropriate for eval and consideration of need for ER visit.

## 2019-03-27 NOTE — Discharge Instructions (Addendum)
Please return for any problem.  Follow-up with your regular care provider as instructed.  Make sure that you drink plenty of fluids as instructed.

## 2019-03-28 ENCOUNTER — Ambulatory Visit: Payer: Medicare Other | Admitting: Family Medicine

## 2019-03-31 ENCOUNTER — Other Ambulatory Visit: Payer: Self-pay | Admitting: Family Medicine

## 2019-04-01 ENCOUNTER — Encounter: Payer: Self-pay | Admitting: Family Medicine

## 2019-04-01 ENCOUNTER — Ambulatory Visit (INDEPENDENT_AMBULATORY_CARE_PROVIDER_SITE_OTHER): Payer: Medicare Other | Admitting: Family Medicine

## 2019-04-01 ENCOUNTER — Ambulatory Visit: Payer: Self-pay

## 2019-04-01 VITALS — BP 170/82 | HR 86 | Temp 97.4°F | Ht 63.0 in | Wt 119.5 lb

## 2019-04-01 DIAGNOSIS — I10 Essential (primary) hypertension: Secondary | ICD-10-CM

## 2019-04-01 DIAGNOSIS — E119 Type 2 diabetes mellitus without complications: Secondary | ICD-10-CM

## 2019-04-01 DIAGNOSIS — K5909 Other constipation: Secondary | ICD-10-CM

## 2019-04-01 NOTE — Assessment & Plan Note (Signed)
Still with constipation and LLQ pain likely 2/2 to constipation even with miralax and fiber supplement. Advised increasing miralax to BID. Return if no improvement after 3-4 weeks.

## 2019-04-01 NOTE — Patient Instructions (Addendum)
Constipation   Constipation is a common issue. Often it is related to diet and occasionally medications.   What you can do to treat your symptoms 1) Fiber -- Eat more fiber rich foods: beans, broccoli, berries, avocados, popcorn, pear/apple, green peas, turnip greens, brussels sprouts, whole grains (barley, bran, quinoa, oatmeal) -- Take a Fiber supplement: Psyllium (Metamucil)  -- Could also eat Prunes daily  2) Hydration  -- Drink more water: Try to drink 64 oz of water per day -- Eight, 80z glasses a day  3) Exercise -- Moderate exercise (walking, jogging, biking) for 30 minutes, 5 days a week  4) Dedicate time for Bowel movements - do not delay  5) Laxatives -- Polyethylene Glycol (Miralax) - Take twice daily  Treating chronic constipation is often about finding the right amount of medication and fiber to keep you regular and comfortable. For some people that may be daily metamucil and colace every other day. For others it may be Metamucil and colace twice daily and Miralax 3 times a week. The goal is to go slow and listen to your body. And normal can be anywhere from 2-3 soft bowel movements a day to 1 bowel movement every 2-3 days.    Diet - it is OK to get the free meals 2 times a week - if people are bringing you food and you are able to tell them you have diabetes that may be a good idea

## 2019-04-01 NOTE — Telephone Encounter (Signed)
Per appt notes pt already has in office appt today at 2pm with Dr Einar Pheasant.

## 2019-04-01 NOTE — Assessment & Plan Note (Signed)
Diet controled and last Hgba1c 5.8%. Checking CBG and 110-130 in setting of getting donated food. Discussed that given her age it is more important that she eat. Advised reaching out to people to let them know about diabetes to hopefully get diabetes friendly meals. Try to eat more fiber to help with constipation but food is likely not the cause directly of her pain. Follow-up with pcp to discuss diabetes further if CBG continue to rise

## 2019-04-01 NOTE — Progress Notes (Signed)
Subjective:     [name] is a 85 y.o. female presenting for Abdominal Pain (left lower side.)     HPI   #Side pain - LLQ abdominal location - worse in the morning - improved with taking miralax today - symptoms >1 month - is having small, hard, stools - stools are like pellets - constipated - comes and goes  - still hurting now - typically will cause pain until she take the miralax - increase burping - increased gas - has been taking miralax once daily - has been trying to drink more water - today cup with miralax, cup with fiber, tea -- drinking enough so her urine sis    #dizziness - went to the hospital with dizziness - side pain not present at that time - dizziness has resolved - got IV fluids and felt really good after that visit - was having some eye complaints   #diet controlled diabetes - has been getting donated food and not eating as well - usually around 90s - recently has had 110-130   Review of Systems  Constitutional: Negative for chills and fever.  Respiratory: Negative for shortness of breath.   Cardiovascular: Negative for palpitations.  Gastrointestinal: Positive for abdominal pain and constipation. Negative for blood in stool, nausea and vomiting.  Neurological: Negative for dizziness.    Chart Review 03/22/2019: ER - dizziness - concerning for othostasis - IV fluids  Social History   Tobacco Use  Smoking Status Former Smoker  Smokeless Tobacco Never Used  Tobacco Comment   quit 1970        Objective:    BP Readings from Last 3 Encounters:  04/01/19 (!) 170/82  03/27/19 (!) 144/78  03/22/19 (!) 173/69   Wt Readings from Last 3 Encounters:  04/01/19 119 lb 8 oz (54.2 kg)  02/28/19 117 lb 8 oz (53.3 kg)  10/28/18 120 lb (54.4 kg)    BP (!) 170/82   Pulse 86   Temp (!) 97.4 F (36.3 C)   Ht 5\' 3"  (1.6 m)   Wt 119 lb 8 oz (54.2 kg)   SpO2 97%   BMI 21.17 kg/m    Physical Exam Constitutional:      General:  She is not in acute distress.    Appearance: She is well-developed. She is not diaphoretic.  HENT:     Head: Normocephalic and atraumatic.     Right Ear: External ear normal.     Left Ear: External ear normal.     Nose: Nose normal.     Mouth/Throat:     Mouth: Mucous membranes are moist.  Eyes:     Conjunctiva/sclera: Conjunctivae normal.  Neck:     Musculoskeletal: Neck supple.  Cardiovascular:     Rate and Rhythm: Normal rate and regular rhythm.     Heart sounds: Normal heart sounds.  Pulmonary:     Effort: Pulmonary effort is normal. No respiratory distress.     Breath sounds: Normal breath sounds. No wheezing.  Abdominal:     General: Abdomen is flat. Bowel sounds are normal. There is no distension.     Palpations: Abdomen is soft.     Tenderness: There is abdominal tenderness in the left lower quadrant. There is no guarding or rebound.  Skin:    General: Skin is warm and dry.     Capillary Refill: Capillary refill takes less than 2 seconds.  Neurological:     Mental Status: She is alert. Mental status is  at baseline.  Psychiatric:        Mood and Affect: Mood normal.        Behavior: Behavior normal.           Assessment & Plan:   Problem List Items Addressed This Visit      Cardiovascular and Mediastinum   Essential hypertension, benign    BP elevated today, but also with 2 recent ER visits with dizziness. Increase hydration. Given age and symptoms in the ER of likely orthostatic symptoms will defer BP management to PCP.         Digestive   Constipation, chronic    Still with constipation and LLQ pain likely 2/2 to constipation even with miralax and fiber supplement. Advised increasing miralax to BID. Return if no improvement after 3-4 weeks.         Endocrine   Controlled type 2 diabetes mellitus without complication, without long-term current use of insulin ([place]) - Primary    Diet controled and last Hgba1c 5.8%. Checking CBG and 110-130 in setting of  getting donated food. Discussed that given her age it is more important that she eat. Advised reaching out to people to let them know about diabetes to hopefully get diabetes friendly meals. Try to eat more fiber to help with constipation but food is likely not the cause directly of her pain. Follow-up with pcp to discuss diabetes further if CBG continue to rise        This visit occurred during the SARS-CoV-2 public health emergency.  Safety protocols were in place, including screening questions prior to the visit, additional usage of staff PPE, and extensive cleaning of exam room while observing appropriate contact time as indicated for disinfecting solutions.     Return if symptoms worsen or fail to improve.  Lesleigh Noe, MD

## 2019-04-01 NOTE — Telephone Encounter (Signed)
Incoming call from Patient with a complaint that she is  Having left sided pain thaT originates from left side.  Under her rib cage.   With dizziness. States that pain is constant.  Pain rated moderate.  Has happened once before.  Admitted to hospital.  Received fluids. Maybe constipated.  Passing small pellets states patient.  Would like an appointment today.  Patient state that she is going to take a dose of Miralax.and call back with status update.  Would like an appointment this afternoon.         Answer Assessment - Initial Assessment Questions 1. LOCATION: "Where does it hurt?"   under left side rib cage 2. RADIATION: "Does the pain shoot anywhere else?" (e.g., chest, back)     3. ONSET: "When did the pain begin?" (e.g., minutes, hours or days ago)      Off ana on for several days 4. SUDDEN: "Gradual or sudden onset?"     5. PATTERN "Does the pain come and go, or is it constant?"    - If constant: "Is it getting better, staying the same, or worsening?"      (Note: Constant means the pain never goes away completely; most serious pain is constant and it progresses)     - If intermittent: "How long does it last?" "Do you have pain now?"     (Note: Intermittent means the pain goes away completely between bouts)     constant 6. SEVERITY: "How bad is the pain?"  (e.g., Scale 1-10; mild, moderate, or severe)   - MILD (1-3): doesn't interfere with normal activities, abdomen soft and not tender to touch    - MODERATE (4-7): interferes with normal activities or awakens from sleep, tender to touch    - SEVERE (8-10): excruciating pain, doubled over, unable to do any normal activities      moderate 7. RECURRENT SYMPTOM: "Have you ever had this type of abdominal pain before?" If so, ask: "When was the last time?" and "What happened that time?"      Fluids from hospital 8. CAUSE: "What do you think is causing the abdominal pain?"     *No Answer* 9. RELIEVING/AGGRAVATING FACTORS: "What makes it  better or worse?" (e.g., movement, antacids, bowel movement)    miralx 10. OTHER SYMPTOMS: "Has there been any vomiting, diarrhea, constipation, or urine problems?"       Constipated possibly 11. PREGNANCY: "Is there any chance you are pregnant?" "When was your last menstrual period?"       Na.  Protocols used: ABDOMINAL PAIN [facility]

## 2019-04-01 NOTE — Telephone Encounter (Signed)
See clinic note from today

## 2019-04-01 NOTE — Assessment & Plan Note (Signed)
BP elevated today, but also with 2 recent ER visits with dizziness. Increase hydration. Given age and symptoms in the ER of likely orthostatic symptoms will defer BP management to PCP.

## 2019-04-03 ENCOUNTER — Encounter: Payer: Self-pay | Admitting: Family Medicine

## 2019-04-03 ENCOUNTER — Ambulatory Visit (INDEPENDENT_AMBULATORY_CARE_PROVIDER_SITE_OTHER): Payer: Medicare Other | Admitting: Family Medicine

## 2019-04-03 ENCOUNTER — Other Ambulatory Visit: Payer: Self-pay

## 2019-04-03 VITALS — BP 140/80 | HR 76 | Temp 97.3°F | Ht 63.0 in | Wt 117.5 lb

## 2019-04-03 DIAGNOSIS — R55 Syncope and collapse: Secondary | ICD-10-CM

## 2019-04-03 DIAGNOSIS — E119 Type 2 diabetes mellitus without complications: Secondary | ICD-10-CM

## 2019-04-03 DIAGNOSIS — R42 Dizziness and giddiness: Secondary | ICD-10-CM | POA: Diagnosis not present

## 2019-04-03 DIAGNOSIS — I10 Essential (primary) hypertension: Secondary | ICD-10-CM

## 2019-04-03 DIAGNOSIS — K5909 Other constipation: Secondary | ICD-10-CM

## 2019-04-03 NOTE — Progress Notes (Signed)
Chief Complaint  Patient presents with  . ER Follow-up    History of Present Illness: HPI  85 year old female presents following 2 ER visits for self limited dizzy spells.   Patient reports that after lunch she had an episode of feeling dizzy and lightheaded.  She did not pass out.  She denies associated chest pain or shortness of breath.  Her symptoms lasted about an hour.  She now feels fine and is back to baseline.  She denies associated fever.  She reports intermittent symptoms similar to today's over the last several weeks.  Seen in ER 2 times in last month.  ER work up including CXR, head CT, CMET, cbc, UA unremarkable  given IVF for possible dehydration at first ER visit.  Chronic constipation: saw Dr. Einar Pheasant on 12/8.  Increased miralax to BID.  She reports that her side pain is resolved.  She is drinking water and fiber.  Last night had sizable soft BMs.  no new confusion, no new weakness.   She denies any further dizzy spells, no presyncope. No CP, no SOB. No HA, no falls.  DM:  Recent CBGs 110-130 in setting of having donated food brought by, different diet. Lab Results  Component Value Date   HGBA1C 5.8 02/18/2019    This visit occurred during the SARS-CoV-2 public health emergency.  Safety protocols were in place, including screening questions prior to the visit, additional usage of staff PPE, and extensive cleaning of exam room while observing appropriate contact time as indicated for disinfecting solutions.   COVID 19 screen:  No recent travel or known exposure to COVID19 The patient denies respiratory symptoms of COVID 19 at this time. The importance of social distancing was discussed today.     Review of Systems  Constitutional: Negative for chills and fever.  HENT: Negative for congestion and ear pain.   Eyes: Negative for pain and redness.  Respiratory: Negative for cough and shortness of breath.   Cardiovascular: Negative for chest pain, palpitations and  leg swelling.  Gastrointestinal: Negative for abdominal pain, blood in stool, constipation, diarrhea, nausea and vomiting.  Genitourinary: Negative for dysuria.  Musculoskeletal: Negative for falls and myalgias.  Skin: Negative for rash.  Neurological: Negative for dizziness.  Psychiatric/Behavioral: Negative for depression. The patient is not nervous/anxious.       Past Medical History:  Diagnosis Date  . Acute pharyngitis   . Acute sinusitis, unspecified   . Acute upper respiratory infections of unspecified site   . Allergy   . Anal and rectal polyp   . Cataract    History of  . Cavernous angioma   . Degenerative cervical disc 02/2006  . Diabetes mellitus    Type II  . GERD (gastroesophageal reflux disease)   . Gout   . Hemangioma of unspecified site   . Hyperlipidemia   . Hypertension   . Orthostatic hypotension 04/2006   Hospital  . Osteoarthritis   . Osteopenia   . Other abscess of vulva   . Palpitations   . Syncope and collapse    1 episode    reports that she has quit smoking. She has never used smokeless tobacco. She reports current alcohol use. She reports that she does not use drugs.   Current Outpatient Medications:  .  ACCU-CHEK AVIVA PLUS test strip, USE TO CHECK BLOOD SUGAR DAILY AS NEEDED. DX: E11.9 (Patient taking differently: 1 each by Other route as needed (to check BGL). DX. E11.9), Disp: 100 each, Rfl:  3 .  acetaminophen (TYLENOL) 325 MG tablet, Take 650 mg by mouth 2 (two) times daily. , Disp: , Rfl:  .  aspirin EC 81 MG tablet, Take 81 mg by mouth at bedtime. , Disp: , Rfl:  .  Blood Glucose Monitoring Suppl (ACCU-CHEK GUIDE) w/Device KIT, 1 each by Does not apply route daily. Use to check blood sugar daily as needed.  Dx: E11.9, Disp: 1 kit, Rfl: 0 .  Fiber POWD, Take 15 mLs by mouth 3 (three) times daily as needed (constipation)., Disp: , Rfl:  .  fish oil-omega-3 fatty acids 1000 MG capsule, Take 1 g by mouth daily.  , Disp: , Rfl:  .  fluticasone  (FLONASE) 50 MCG/ACT nasal spray, Place 2 sprays into both nostrils daily as needed for allergies., Disp: 16 g, Rfl: 3 .  Lancets (FREESTYLE) lancets, CHECK BLOOD SUGAR 1-2 TIMES A DAY. DX: E11.9 NON INSULIN DEPENDENT, Disp: 100 each, Rfl: 3 .  Multiple Vitamin (MULTIVITAMIN WITH MINERALS) TABS tablet, Take 1 tablet by mouth daily., Disp: , Rfl:  .  Polyethyl Glycol-Propyl Glycol (SYSTANE OP), Place 1 drop into both eyes 3 (three) times daily as needed (for dry eyes). , Disp: , Rfl:  .  polyethylene glycol (MIRALAX / GLYCOLAX) packet, Take 17 g by mouth daily as needed for mild constipation. , Disp: , Rfl:  .  ramipril (ALTACE) 5 MG capsule, TAKE 1 CAPSULE (5 MG TOTAL) BY MOUTH DAILY. (Patient taking differently: Take 5 mg by mouth daily. ), Disp: 90 capsule, Rfl: 1 .  triamcinolone lotion (KENALOG) 0.1 %, Apply 1 application topically 2 (two) times daily as needed (for itching). , Disp: , Rfl:    Observations/Objective: Blood pressure (!) 146/82, pulse 76, temperature (!) 97.3 F (36.3 C), temperature source Temporal, height _0  (1.6 m), weight 117 lb 8 oz (53.3 kg), SpO2 97 %.  Physical Exam Constitutional:      General: She is not in acute distress.    Appearance: Normal appearance. She is well-developed. She is not ill-appearing or toxic-appearing.     Comments: Elderly in NAD  HENT:     Head: Normocephalic.     Right Ear: Hearing, tympanic membrane, ear canal and external ear normal. Tympanic membrane is not erythematous, retracted or bulging.     Left Ear: Hearing, tympanic membrane, ear canal and external ear normal. Tympanic membrane is not erythematous, retracted or bulging.     Nose: No mucosal edema or rhinorrhea.     Right Sinus: No maxillary sinus tenderness or frontal sinus tenderness.     Left Sinus: No maxillary sinus tenderness or frontal sinus tenderness.     Mouth/Throat:     Pharynx: Uvula midline.  Eyes:     General: Lids are normal. Lids are everted, no foreign  bodies appreciated.     Conjunctiva/sclera: Conjunctivae normal.     Pupils: Pupils are equal, round, and reactive to light.  Neck:     Thyroid: No thyroid mass or thyromegaly.     Vascular: No carotid bruit.     Trachea: Trachea normal.  Cardiovascular:     Rate and Rhythm: Normal rate and regular rhythm.     Pulses: Normal pulses.     Heart sounds: Normal heart sounds, S1 normal and S2 normal. No murmur. No friction rub. No gallop.   Pulmonary:     Effort: Pulmonary effort is normal. No tachypnea or respiratory distress.     Breath sounds: Normal breath sounds. No decreased breath  sounds, wheezing, rhonchi or rales.  Abdominal:     General: Bowel sounds are normal.     Palpations: Abdomen is soft.     Tenderness: There is no abdominal tenderness.  Musculoskeletal:     Cervical back: Normal range of motion and neck supple.  Skin:    General: Skin is warm and dry.     Findings: No rash.  Neurological:     Mental Status: She is alert.  Psychiatric:        Mood and Affect: Mood is not anxious or depressed.        Speech: Speech normal.        Behavior: Behavior normal. Behavior is cooperative.        Thought Content: Thought content normal.        Judgment: Judgment normal.      Assessment and Plan   Constipation, chronic Improving with miralax.   Near syncope Improved with IVF. Encouraged water.  Essential hypertension, benign Tolertate up tpo 150/90 given age.  Controlled type 2 diabetes mellitus without complication, without long-term current use of insulin ([place])  Well controlled.. tolerate CBGs slightly elevate to avoid hypoglycemiua.     Eliezer Lofts, MD

## 2019-04-03 NOTE — Assessment & Plan Note (Signed)
Improved with IVF. Encouraged water.

## 2019-04-03 NOTE — Assessment & Plan Note (Signed)
Tolertate up tpo 150/90 given age.

## 2019-04-03 NOTE — Assessment & Plan Note (Signed)
Well controlled.. tolerate CBGs slightly elevate to avoid hypoglycemiua.

## 2019-04-03 NOTE — Assessment & Plan Note (Signed)
Improving with miralax.

## 2019-04-03 NOTE — Patient Instructions (Addendum)
Start Eucerin cream on hands.  Keep up with water, can continue miralax.

## 2019-04-08 ENCOUNTER — Other Ambulatory Visit: Payer: Self-pay | Admitting: Family Medicine

## 2019-04-11 ENCOUNTER — Telehealth: Payer: Self-pay

## 2019-04-11 NOTE — Telephone Encounter (Signed)
Patient called stating that she was walking to her mail box this morning and her driveway is a steep/incline area so she lost her balance and fell. She fell to her knees and caught herself with her hands. Did not hit her head, did not pass out. She is feeling ok she states. Her daughter, [name], is there with her now. She has no cuts/abrasions on her hands. Margaret and patient wanted to know if patient becomes sore over the weekend what should patient do or take? Can she increase her Tylenol use if she starts to hurt? Offered appointment today but patient and daughter declined stating she is ok just wanted to know just in case.  Also [name] was asking if Dr Diona Browner could help with getting someone to come and help patient around the house-like housework, cooking. Patient has arthritis that is getting worse and is affecting patient with what she can do due to this. Patient does not need help with medication management, bathing or anything like that just more housework things. Patient is not at a point of needing to be in a bursing home. Please review.

## 2019-04-11 NOTE — Telephone Encounter (Signed)
Ms. Bhola notified as instructed by telephone.  Patient state understanding.

## 2019-04-11 NOTE — Telephone Encounter (Signed)
Can increase tylenol .[name]  1000 mg three times daily.   Medicare does not cover help with housework cooking.   Given her numbers for personal care agencies.. I have list if you need it. Or refer daughter to call [facility]  or [place] ( number online)

## 2019-04-14 ENCOUNTER — Telehealth: Payer: Self-pay

## 2019-04-14 NOTE — Telephone Encounter (Signed)
Opened in error

## 2019-04-14 NOTE — Telephone Encounter (Signed)
Pt called back saying she was in pain and had not increased the Tylenol. I advised her of Dr Rometta Emery note to take 1000mg  3 times day. She will try the increased Tylenol and if no improvement by Wednesday, she will make an OV because I advised her we were closed after 1pm on Thursday for the holiday.

## 2019-04-15 ENCOUNTER — Other Ambulatory Visit: Payer: Self-pay

## 2019-04-15 ENCOUNTER — Emergency Department (HOSPITAL_COMMUNITY): Payer: Medicare Other

## 2019-04-15 ENCOUNTER — Emergency Department (HOSPITAL_COMMUNITY)
Admission: EM | Admit: 2019-04-15 | Discharge: 2019-04-15 | Disposition: A | Discharge status: Home / Self Care | Payer: Medicare Other | Attending: Emergency Medicine | Admitting: Emergency Medicine

## 2019-04-15 ENCOUNTER — Encounter (HOSPITAL_COMMUNITY): Payer: Self-pay | Admitting: Emergency Medicine

## 2019-04-15 DIAGNOSIS — Z96651 Presence of right artificial knee joint: Secondary | ICD-10-CM | POA: Diagnosis not present

## 2019-04-15 DIAGNOSIS — E119 Type 2 diabetes mellitus without complications: Secondary | ICD-10-CM | POA: Diagnosis not present

## 2019-04-15 DIAGNOSIS — Z7982 Long term (current) use of aspirin: Secondary | ICD-10-CM | POA: Insufficient documentation

## 2019-04-15 DIAGNOSIS — Y999 Unspecified external cause status: Secondary | ICD-10-CM | POA: Insufficient documentation

## 2019-04-15 DIAGNOSIS — Y929 Unspecified place or not applicable: Secondary | ICD-10-CM | POA: Insufficient documentation

## 2019-04-15 DIAGNOSIS — Z8673 Personal history of transient ischemic attack (TIA), and cerebral infarction without residual deficits: Secondary | ICD-10-CM | POA: Insufficient documentation

## 2019-04-15 DIAGNOSIS — Y9301 Activity, walking, marching and hiking: Secondary | ICD-10-CM | POA: Insufficient documentation

## 2019-04-15 DIAGNOSIS — I10 Essential (primary) hypertension: Secondary | ICD-10-CM | POA: Diagnosis not present

## 2019-04-15 DIAGNOSIS — Z87891 Personal history of nicotine dependence: Secondary | ICD-10-CM | POA: Insufficient documentation

## 2019-04-15 DIAGNOSIS — M545 Low back pain, unspecified: Secondary | ICD-10-CM

## 2019-04-15 DIAGNOSIS — Z79899 Other long term (current) drug therapy: Secondary | ICD-10-CM | POA: Diagnosis not present

## 2019-04-15 DIAGNOSIS — W010XXA Fall on same level from slipping, tripping and stumbling without subsequent striking against object, initial encounter: Secondary | ICD-10-CM | POA: Diagnosis not present

## 2019-04-15 DIAGNOSIS — R109 Unspecified abdominal pain: Secondary | ICD-10-CM | POA: Insufficient documentation

## 2019-04-15 DIAGNOSIS — N938 Other specified abnormal uterine and vaginal bleeding: Secondary | ICD-10-CM | POA: Diagnosis not present

## 2019-04-15 DIAGNOSIS — W19XXXA Unspecified fall, initial encounter: Secondary | ICD-10-CM

## 2019-04-15 LAB — SAMPLE TO BLOOD BANK

## 2019-04-15 LAB — COMPREHENSIVE METABOLIC PANEL
ALT: 23 U/L (ref 0–44)
AST: 26 U/L (ref 15–41)
Albumin: 3.7 g/dL (ref 3.5–5.0)
Alkaline Phosphatase: 46 U/L (ref 38–126)
Anion gap: 11 (ref 5–15)
BUN: 18 mg/dL (ref 8–23)
CO2: 22 mmol/L (ref 22–32)
Calcium: 9.1 mg/dL (ref 8.9–10.3)
Chloride: 97 mmol/L — ABNORMAL LOW (ref 98–111)
Creatinine, Ser: 0.99 mg/dL (ref 0.44–1.00)
GFR calc Af Amer: 57 mL/min — ABNORMAL LOW (ref >60)
GFR calc non Af Amer: 49 mL/min — ABNORMAL LOW (ref >60)
Glucose, Bld: 105 mg/dL — ABNORMAL HIGH (ref 70–99)
Potassium: 4.7 mmol/L (ref 3.5–5.1)
Sodium: 130 mmol/L — ABNORMAL LOW (ref 135–145)
Total Bilirubin: 0.8 mg/dL (ref 0.3–1.2)
Total Protein: 6.6 g/dL (ref 6.5–8.1)

## 2019-04-15 LAB — URINALYSIS, ROUTINE W REFLEX MICROSCOPIC
Bacteria, UA: NONE SEEN
Bilirubin Urine: NEGATIVE
Glucose, UA: NEGATIVE mg/dL
Ketones, ur: NEGATIVE mg/dL
Leukocytes,Ua: NEGATIVE
Nitrite: NEGATIVE
Protein, ur: NEGATIVE mg/dL
Specific Gravity, Urine: 1.006 (ref 1.005–1.030)
pH: 7 (ref 5.0–8.0)

## 2019-04-15 LAB — CBC WITH DIFFERENTIAL/PLATELET
Abs Immature Granulocytes: 0.03 10*3/uL (ref 0.00–0.07)
Basophils Absolute: 0 10*3/uL (ref 0.0–0.1)
Basophils Relative: 0 %
Eosinophils Absolute: 0.2 10*3/uL (ref 0.0–0.5)
Eosinophils Relative: 2 %
HCT: 38.2 % (ref 36.0–46.0)
Hemoglobin: 12.8 g/dL (ref 12.0–15.0)
Immature Granulocytes: 0 %
Lymphocytes Relative: 13 %
Lymphs Abs: 1.2 10*3/uL (ref 0.7–4.0)
MCH: 32 pg (ref 26.0–34.0)
MCHC: 33.5 g/dL (ref 30.0–36.0)
MCV: 95.5 fL (ref 80.0–100.0)
Monocytes Absolute: 0.6 10*3/uL (ref 0.1–1.0)
Monocytes Relative: 6 %
Neutro Abs: 7.1 10*3/uL (ref 1.7–7.7)
Neutrophils Relative %: 79 %
Platelets: 319 10*3/uL (ref 150–400)
RBC: 4 MIL/uL (ref 3.87–5.11)
RDW: 13 % (ref 11.5–15.5)
WBC: 9.1 10*3/uL (ref 4.0–10.5)
nRBC: 0 % (ref 0.0–0.2)

## 2019-04-15 LAB — PROTIME-INR
INR: 0.9 (ref 0.8–1.2)
Prothrombin Time: 12.1 seconds (ref 11.4–15.2)

## 2019-04-15 LAB — POC OCCULT BLOOD, ED: Fecal Occult Bld: NEGATIVE

## 2019-04-15 MED ORDER — ACETAMINOPHEN 325 MG PO TABS
650.0000 mg | ORAL_TABLET | Freq: Once | ORAL | Status: AC
  Administered 2019-04-15: 650 mg via ORAL
  Filled 2019-04-15: qty 2

## 2019-04-15 MED ORDER — IOHEXOL 300 MG/ML  SOLN
70.0000 mL | Freq: Once | INTRAMUSCULAR | Status: AC | PRN
  Administered 2019-04-15: 70 mL via INTRAVENOUS

## 2019-04-15 NOTE — Discharge Instructions (Signed)
It is unclear where the bleeding is coming from.  If you develop new or worsening bleeding, especially rectal bleeding, return to the ER for evaluation.  Your CT scan showed a thickened spot in your colon, you will need to have a colonoscopy to investigate this.  Call the gastroenterologist listed for an appointment as soon as possible.

## 2019-04-15 NOTE — ED Notes (Signed)
Pt ambulated with walker and needed no assistance needed. Pt had steady gait.

## 2019-04-15 NOTE — ED Provider Notes (Signed)
[facility] Provider Note   CSN: [id number] Arrival date & time: 04/15/19  0548     History Chief Complaint  Patient presents with  . ? Rectal/Vaginal Bleeding    Fall 3 days ago    [name] is a 85 y.o. female.  HPI 85 year old female presents after an episode of bleeding. She went to the bathroom this AM at ~4 am, and noticed blood was on the floor. She doesn't know if it came from her vagina or rectum. No urinary symptoms. Has gone to the bathroom since without problems. No dizziness/lightheadedness.  She also fell about 4 days ago but did not think she injured herself.  However she has been having intermittent right side/hip/back pain.  Mostly when she tries to sit up/stand up.  She is able to walk but sometimes has to hold onto the wall.  Currently at rest has no discomfort.  She is been taking an extra Tylenol (twice daily up to 3 times daily) but still has the discomfort. She is on a baby aspirin, no other blood thinners.  Past Medical History:  Diagnosis Date  . Acute pharyngitis   . Acute sinusitis, unspecified   . Acute upper respiratory infections of unspecified site   . Allergy   . Anal and rectal polyp   . Cataract    History of  . Cavernous angioma   . Degenerative cervical disc 02/2006  . Diabetes mellitus    Type II  . GERD (gastroesophageal reflux disease)   . Gout   . Hemangioma of unspecified site   . Hyperlipidemia   . Hypertension   . Orthostatic hypotension 04/2006   Hospital  . Osteoarthritis   . Osteopenia   . Other abscess of vulva   . Palpitations   . Syncope and collapse    1 episode    Patient Active Problem List   Diagnosis Date Noted  . Seborrheic keratoses 10/16/2017  . Hyponatremia 09/14/2017  . Left upper quadrant pain 08/07/2017  . Right hip pain 07/12/2017  . Hearing loss 02/20/2017  . TIA (transient ischemic attack), possible 11/14/2016  . Left carpal tunnel syndrome 08/11/2016   . Epigastric abdominal pain 06/15/2016  . Constipation, chronic 03/21/2016  . Near syncope 05/07/2015  . Osteoarthritis of right shoulder 05/04/2015  . Counseling regarding end of life decision making 01/15/2015  . Skin lesion of right leg 07/10/2014  . Insomnia 04/10/2014  . Osteoarthritis of right knee 10/14/2013  . Osteoarthritis of left knee 10/14/2013  . Infected sebaceous cyst 11/29/2012  . Gout, chronic, without tophus 12/13/2009  . Osteoporosis 10/29/2009  . PALPITATIONS, OCCASIONAL 07/07/2008  . Allergic rhinitis 06/02/2008  . HEMANGIOMA 07/30/2006  . Controlled type 2 diabetes mellitus without complication, without long-term current use of insulin ([place]) 07/30/2006  . Hyperlipidemia LDL goal <100 07/30/2006  . Essential hypertension, benign 07/30/2006  . GERD 07/30/2006  . RECTAL POLYPS 07/30/2006  . OSTEOARTHRITIS 07/30/2006  . CATARACT, HX OF 07/30/2006    Past Surgical History:  Procedure Laterality Date  . ABDOMINAL HYSTERECTOMY    . Aortic dopplers     Mild plaque.  EEG okay  . CATARACT EXTRACTION    . Cavernous angioma    . Cavernous hemangioma  04/2004   Hospital  . [place]     Right     OB History   No obstetric history on file.     Family History  Problem Relation Age of Onset  .  Heart disease Other        CHF  . Cancer Maternal Aunt        Breast CA    Social History   Tobacco Use  . Smoking status: Former Research scientist (life sciences)  . Smokeless tobacco: Never Used  . Tobacco comment: quit 1970  Substance Use Topics  . Alcohol use: Yes    Alcohol/week: 0.0 standard drinks    Comment: wine rarely  . Drug use: No    Home Medications Prior to Admission medications   Medication Sig Start Date End Date Taking? Authorizing Provider  ACCU-CHEK AVIVA PLUS test strip USE TO CHECK BLOOD SUGAR DAILY AS NEEDED. DX: E11.9 Patient taking differently: 1 each by Other route as needed (to check BGL). DX. E11.9 06/17/18   Bedsole, Amy E, MD   acetaminophen (TYLENOL) 325 MG tablet Take 650 mg by mouth 2 (two) times daily.     [provider]  aspirin EC 81 MG tablet Take 81 mg by mouth at bedtime.     [provider]  Blood Glucose Monitoring Suppl (ACCU-CHEK GUIDE) w/Device KIT 1 each by Does not apply route daily. Use to check blood sugar daily as needed.  Dx: E11.9 04/30/18   Bedsole, Amy E, MD  Fiber POWD Take 15 mLs by mouth 3 (three) times daily as needed (constipation).    [provider]  fish oil-omega-3 fatty acids 1000 MG capsule Take 1 g by mouth daily.      [provider]  fluticasone (FLONASE) 50 MCG/ACT nasal spray Place 2 sprays into both nostrils daily as needed for allergies. 03/31/19   Bedsole, Amy E, MD  Lancets (FREESTYLE) lancets CHECK BLOOD SUGAR 1-2 TIMES A DAY. DX: E11.9 NON INSULIN DEPENDENT 08/05/18   [name], MD  Multiple Vitamin (MULTIVITAMIN WITH MINERALS) TABS tablet Take 1 tablet by mouth daily.    [provider]  Polyethyl Glycol-Propyl Glycol (SYSTANE OP) Place 1 drop into both eyes 3 (three) times daily as needed (for dry eyes).     [provider]  polyethylene glycol (MIRALAX / GLYCOLAX) packet Take 17 g by mouth daily as needed for mild constipation.     [provider]  ramipril (ALTACE) 5 MG capsule TAKE 1 CAPSULE (5 MG TOTAL) BY MOUTH DAILY. 04/08/19   Bedsole, Amy E, MD  triamcinolone lotion (KENALOG) 0.1 % Apply 1 application topically 2 (two) times daily as needed (for itching).  06/06/18   [provider]    Allergies    Codeine and Statins  Review of Systems   Review of Systems  Gastrointestinal: Positive for abdominal pain.  Genitourinary: Positive for flank pain.  Musculoskeletal: Positive for arthralgias and back pain.  All other systems reviewed and are negative.   Physical Exam Updated Vital Signs BP (!) 194/82   Pulse 65   Temp (!) 97.5 F (36.4 C) (Oral)   Resp 16   SpO2 100%   Physical  Exam Vitals and nursing note reviewed. Exam conducted with a chaperone present.  Constitutional:      Appearance: She is well-developed.  HENT:     Head: Normocephalic and atraumatic.     Right Ear: External ear normal.     Left Ear: External ear normal.     Nose: Nose normal.  Eyes:     General:        Right eye: No discharge.        Left eye: No discharge.  Cardiovascular:  Rate and Rhythm: Normal rate and regular rhythm.     Heart sounds: Normal heart sounds.  Pulmonary:     Effort: Pulmonary effort is normal.     Breath sounds: Normal breath sounds.  Abdominal:     General: There is no distension.     Palpations: Abdomen is soft.     Tenderness: There is no abdominal tenderness.  Genitourinary:    Comments: There is a small red dot on the inside of vagina (~4 o'clock) but no bleeding/laceration, unclear cause Rectal exam without hemorrhoids, masses, tenderness. Light brown stool, no gross blood on digital exam Musculoskeletal:     Thoracic back: Tenderness present.     Lumbar back: No tenderness or bony tenderness.     Right hip: Tenderness present.       Legs:     Comments: Small abrasion to right knee  Skin:    General: Skin is warm and dry.  Neurological:     Mental Status: She is alert.  Psychiatric:        Mood and Affect: Mood is not anxious.     ED Results / Procedures / Treatments   Labs (all labs ordered are listed, but only abnormal results are displayed) Labs Reviewed  COMPREHENSIVE METABOLIC PANEL - Abnormal; Notable for the following components:      Result Value   Sodium 130 (*)    Chloride 97 (*)    Glucose, Bld 105 (*)    GFR calc non Af Amer 49 (*)    GFR calc Af Amer 57 (*)    All other components within normal limits  URINALYSIS, ROUTINE W REFLEX MICROSCOPIC - Abnormal; Notable for the following components:   Color, Urine STRAW (*)    Hgb urine dipstick SMALL (*)    All other components within normal limits  PROTIME-INR  CBC WITH  DIFFERENTIAL/PLATELET  POC OCCULT BLOOD, ED  SAMPLE TO BLOOD BANK    EKG EKG Interpretation  Date/Time:  Tuesday April 15 2019 06:28:26 EST Ventricular Rate:  71 PR Interval:  150 QRS Duration: 80 QT Interval:  394 QTC Calculation: 428 R Axis:   -62 Text Interpretation: Normal sinus rhythm Left axis deviation Moderate voltage criteria for LVH, may be normal variant ( R in aVL , Cornell product ) Inferior infarct , age undetermined Anterior infarct , age undetermined Abnormal ECG no significant change since Nov 2020 Confirmed by Sherwood Gambler [phone]) on 04/15/2019 7:25:24 AM   Radiology DG Thoracic Spine W/Swimmers  Result Date: 04/15/2019 CLINICAL DATA:  [place] 3 days ago.  Back pain. EXAM: THORACIC SPINE - 3 VIEWS COMPARISON:  None. FINDINGS: No fracture, bone lesion or spondylolisthesis. Mild curvature, convex the right, apex at T7. Mild loss of disc height with small endplate osteophytes from the mid through the lower thoracic spine reflecting disc degenerative change. Soft tissues are unremarkable. IMPRESSION: 1. No fracture or acute finding. 2. Degenerative changes as detailed. Electronically Signed   By: Lajean Manes M.D.   On: 04/15/2019 08:22   DG Lumbar Spine Complete  Result Date: 04/15/2019 CLINICAL DATA:  Fall 3 days ago. Right lower back pain. Pain to walk. EXAM: LUMBAR SPINE - COMPLETE 4+ VIEW COMPARISON:  None. FINDINGS: No fracture, no fracture or bone lesion. Grade 1 retrolisthesis of L1 on L2 and L2 on L3, and L5 on S1. Moderate loss of disc height throughout the lumbar spine with endplate sclerosis and osteophytes. Mild levoscoliosis, apex at L3. Scattered aortic atherosclerotic calcifications. Soft tissues are  otherwise unremarkable. IMPRESSION: 1. No fracture or acute finding. 2. Degenerative changes as detailed. Electronically Signed   By: Lajean Manes M.D.   On: 04/15/2019 08:21   CT ABDOMEN PELVIS W CONTRAST  Result Date: 04/15/2019 CLINICAL DATA:   85 year old female with history of trauma from a fall on Friday. Ongoing back pain. EXAM: CT ABDOMEN AND PELVIS WITH CONTRAST TECHNIQUE: Multidetector CT imaging of the abdomen and pelvis was performed using the standard protocol following bolus administration of intravenous contrast. CONTRAST:  2m OMNIPAQUE IOHEXOL 300 MG/ML  SOLN COMPARISON:  CT the abdomen and pelvis 09/10/2017. FINDINGS: Lower chest: Aortic atherosclerosis. Calcified atherosclerotic plaque in the left anterior descending, left circumflex and right coronary arteries. Calcifications of the mitral annulus. Hepatobiliary: No suspicious cystic or solid hepatic lesions. No intra or extrahepatic biliary ductal dilatation. Gallbladder is normal in appearance. Pancreas: No pancreatic mass. No pancreatic ductal dilatation. No pancreatic or peripancreatic fluid collections or inflammatory changes. Spleen: Unremarkable. Adrenals/Urinary Tract: In the upper pole of the right kidney there is a 1.4 cm low-attenuation lesion, compatible with a simple cyst. Adjacent to this there is a subcentimeter low-attenuation lesion which is too small to definitively characterize, but also favored to represent a tiny cyst. Numerous large parapelvic cysts associated with the renal hila bilaterally (left greater than right). No suspicious renal lesions. No hydroureteronephrosis. Urinary bladder is unremarkable in appearance. Bilateral adrenal glands are normal in appearance. Stomach/Bowel: Normal appearance of the stomach. No pathologic dilatation of small bowel or colon. Numerous colonic diverticulae are noted, without surrounding inflammatory changes to suggest an acute diverticulitis at this time. However, there is a large diverticulum in the region of the mid sigmoid colon which has extensive mural thickening best appreciated on axial image 62 of series 3 and coronal image 69 of series 6). The appendix is not confidently identified and may be surgically absent.  Regardless, there are no inflammatory changes noted adjacent to the cecum to suggest the presence of an acute appendicitis at this time. Vascular/Lymphatic: Aortic atherosclerosis, without evidence of aneurysm or dissection in the abdominal or pelvic vasculature. No lymphadenopathy noted in the abdomen or pelvis. Reproductive: Status post hysterectomy. Ovaries are not confidently identified may be surgically absent or atrophic. Other: No significant volume of ascites.  No pneumoperitoneum. Musculoskeletal: There are no aggressive appearing lytic or blastic lesions noted in the visualized portions of the skeleton. IMPRESSION: 1. No evidence of significant acute traumatic injury in the abdomen or pelvis. 2. No acute findings to account for the patient's symptoms. 3. Colonic diverticulosis without evidence to suggest an acute diverticulitis at this time. However, there is a very thick-walled large diverticulum in the mid sigmoid colon, as detailed above. The possibility of colonic neoplasm should be considered, and consideration for follow-up nonemergent colonoscopy is suggested if clinically appropriate given the patient's age and comorbidities. 4. Aortic atherosclerosis, in addition to least 3 vessel coronary artery disease. 5. Additional incidental findings, as above. Electronically Signed   By: DVinnie LangtonM.D.   On: 04/15/2019 10:35   DG Hip Unilat W or Wo Pelvis 2-3 Views Right  Result Date: 04/15/2019 CLINICAL DATA:  Pain following fall EXAM: DG HIP (WITH OR WITHOUT PELVIS) 2-3V RIGHT COMPARISON:  Pelvis and left hip radiographs October 15, 2013 FINDINGS: Frontal pelvis as well as frontal and lateral right hip images were obtained. No acute fracture or dislocation. There is moderate narrowing of each hip joint. No erosive changes. There is degenerative change in the lower lumbar region. IMPRESSION:  Essentially stable osteoarthritic change in each hip joint. No fracture or dislocation. Degenerative change  noted in the lower lumbar spine. Electronically Signed   By: Lowella Grip III M.D.   On: 04/15/2019 08:14    Procedures Procedures (including critical care time)  Medications Ordered in ED Medications  iohexol (OMNIPAQUE) 300 MG/ML solution 70 mL (70 mLs Intravenous Contrast Given 04/15/19 1017)  acetaminophen (TYLENOL) tablet 650 mg (650 mg Oral Given 04/15/19 1141)    ED Course  I have reviewed the triage vital signs and the nursing notes.  Pertinent labs & imaging results that were available during my care of the patient were reviewed by me and considered in my medical decision making (see chart for details).    MDM Rules/Calculators/A&P                      Patient feels fine here.  Some pain with trying to sit up but she was able to ambulate on her own with a walker with no difficulty and reports the pain is improved.  Likely muscular injury from the fall a few days ago.  No obvious bony injury.  As for her bleeding episode, no clear source of the blood.  She has dried scab to her knee but I cannot find any obvious vaginal or rectal bleeding.  Very small amount of blood on the urine dipstick but this is pretty nonspecific.  Hemoglobin is at baseline.  I did make her aware of the CT findings that indicate possible abnormality of her colon with need for colonoscopy.  However given no further bleeding or obvious source I think is reasonable to discharge her home with return precautions. Final Clinical Impression(s) / ED Diagnoses Final diagnoses:  Fall, initial encounter  Acute right-sided low back pain without sciatica    Rx / DC Orders ED Discharge Orders    None       Sherwood Gambler, MD 04/15/19 1239

## 2019-04-15 NOTE — ED Triage Notes (Signed)
Patient arrived with EMS from home reports rectal/vaginal bleeding this morning , patient unsure of the source of bleeding , denies anticoagulant medication , patient stated that she fell 3 days ago with intermittent RLQ pain .

## 2019-04-16 ENCOUNTER — Telehealth: Payer: Self-pay | Admitting: Family Medicine

## 2019-04-16 NOTE — Telephone Encounter (Signed)
Correction... she would not need follow up this week, but please make her follow up the following week given issues seen on CT abd that need to be discussed. Daughter/family should come to that OV if able.

## 2019-04-16 NOTE — Telephone Encounter (Signed)
Pt seen in ED 04/15/2019 for a Fall and Acute right-sided low back pain without sciatica  Pt advised to follow up with PCP prn  Pt is scheduling a visit with GI within 1 week  Do you feel that an ED follow up is necessary at this time? Thanks.

## 2019-04-16 NOTE — Telephone Encounter (Signed)
Spoke with Ms. [name].  She is aware of finding on CT.  She was advised to go see Dr. Paulita Fujita for colonoscopy.  Daughter spoke with Dr. Erlinda Hong office and they stated they needed a referral from the ER doctor.  Daughter spoke with ER and they state they are sending the referral over.   Do you still want [name] with you next week?

## 2019-04-16 NOTE — Telephone Encounter (Signed)
Verify that she is improving and if no issues. If so...no need for OPV.

## 2019-04-16 NOTE — Telephone Encounter (Signed)
ATC to schedule OV, no answer, no voicemail to leave message.   Will send to Butch Penny to follow up.

## 2019-04-17 NOTE — Telephone Encounter (Signed)
Spoke with [name].  She has already scheduled an office visit with Dr. Diona Browner on 04/22/2019 at 11:40 am because she states she would just feel better talking to Dr. Diona Browner and have her explain what is going on.  She still has not heard from the GI office.  I advised that she keep her appointment as scheduled with Dr. Diona Browner.  She ask if [name] could come back with her because she would like to speak with Dr. Diona Browner as well.  I told Ms. Maltos that [name] could be present during her office visit.  FYI to Dr. Diona Browner.

## 2019-04-17 NOTE — Telephone Encounter (Signed)
Not needed

## 2019-04-21 ENCOUNTER — Other Ambulatory Visit: Payer: Self-pay | Admitting: Family Medicine

## 2019-04-21 NOTE — Telephone Encounter (Signed)
No longer on this medication per our record

## 2019-04-22 ENCOUNTER — Encounter: Payer: Self-pay | Admitting: Family Medicine

## 2019-04-22 ENCOUNTER — Ambulatory Visit (INDEPENDENT_AMBULATORY_CARE_PROVIDER_SITE_OTHER): Payer: Medicare Other | Admitting: Family Medicine

## 2019-04-22 ENCOUNTER — Other Ambulatory Visit: Payer: Self-pay

## 2019-04-22 VITALS — BP 122/72 | HR 70 | Temp 98.5°F | Ht 63.0 in | Wt 117.2 lb

## 2019-04-22 DIAGNOSIS — R1013 Epigastric pain: Secondary | ICD-10-CM | POA: Diagnosis not present

## 2019-04-22 DIAGNOSIS — R319 Hematuria, unspecified: Secondary | ICD-10-CM | POA: Diagnosis not present

## 2019-04-22 DIAGNOSIS — K5909 Other constipation: Secondary | ICD-10-CM | POA: Diagnosis not present

## 2019-04-22 DIAGNOSIS — R933 Abnormal findings on diagnostic imaging of other parts of digestive tract: Secondary | ICD-10-CM | POA: Diagnosis not present

## 2019-04-22 LAB — POCT URINALYSIS DIPSTICK
Bilirubin, UA: NEGATIVE
Glucose, UA: NEGATIVE
Ketones, UA: NEGATIVE
Leukocytes, UA: NEGATIVE
Nitrite, UA: NEGATIVE
Protein, UA: POSITIVE — AB
Spec Grav, UA: 1.025 (ref 1.010–1.025)
Urobilinogen, UA: 0.2 E.U./dL
pH, UA: 5.5 (ref 5.0–8.0)

## 2019-04-22 LAB — URINALYSIS, MICROSCOPIC ONLY

## 2019-04-22 MED ORDER — OMEPRAZOLE 40 MG PO CPDR: 40.0000 mg | DELAYED_RELEASE_CAPSULE | Freq: Every day | ORAL | 3 refills | Status: DC

## 2019-04-22 NOTE — Patient Instructions (Signed)
Make appointment with GI as discussed.  Call if further bleeding.

## 2019-04-22 NOTE — Progress Notes (Signed)
Chief Complaint  Patient presents with  . Follow-up    ER visit/CT scan  . Medication Refill    Omeprazole 11m-no longer on med list    History of Present Illness: HPI   85year old female presents to discuss CT scan of abdomen  Done in ER.  She was seen  In ER after episode of BRBPR ( although not 100% sure of source) on 04/15/2019. Hg 12.8  No rectal or vaginal bleeding at that time. Neg fecal occult in ER. On ASA baby.   CT scan showed: IMPRESSION: 1. No evidence of significant acute traumatic injury in the abdomen or pelvis. 2. No acute findings to account for the patient's symptoms. 3. Colonic diverticulosis without evidence to suggest an acute diverticulitis at this time. However, there is a very thick-walled large diverticulum in the mid sigmoid colon, as detailed above. The possibility of colonic neoplasm should be considered, and consideration for follow-up nonemergent colonoscopy is suggested if clinically appropriate given the patient's age and comorbidities. 4. Aortic atherosclerosis, in addition to least 3 vessel coronary artery disease.  normal CT in 2019 ( no bowel wall thickening)  No  Further episodes of bleeding.  No abdominal pain currently.. still getting off and on pain in left abdomen for several years.. seem to get better with increase water and constipation.  No current constipation... has this off and on.  No vaginal pain, no burning   S/P TAH.   This visit occurred during the SARS-CoV-2 public health emergency.  Safety protocols were in place, including screening questions prior to the visit, additional usage of staff PPE, and extensive cleaning of exam room while observing appropriate contact time as indicated for disinfecting solutions.   COVID 19 screen:  No recent travel or known exposure to COVID19 The patient denies respiratory symptoms of COVID 19 at this time. The importance of social distancing was discussed today.     Review of  Systems  Constitutional: Negative for chills and fever.  HENT: Negative for congestion and ear pain.   Eyes: Negative for pain and redness.  Respiratory: Negative for cough and shortness of breath.   Cardiovascular: Negative for chest pain, palpitations and leg swelling.  Gastrointestinal: Negative for abdominal pain, blood in stool, constipation, diarrhea, nausea and vomiting.  Genitourinary: Negative for dysuria.  Musculoskeletal: Negative for falls and myalgias.  Skin: Negative for rash.  Neurological: Negative for dizziness.  Psychiatric/Behavioral: Negative for depression. The patient is not nervous/anxious.       Past Medical History:  Diagnosis Date  . Acute pharyngitis   . Acute sinusitis, unspecified   . Acute upper respiratory infections of unspecified site   . Allergy   . Anal and rectal polyp   . Cataract    History of  . Cavernous angioma   . Degenerative cervical disc 02/2006  . Diabetes mellitus    Type II  . GERD (gastroesophageal reflux disease)   . Gout   . Hemangioma of unspecified site   . Hyperlipidemia   . Hypertension   . Orthostatic hypotension 04/2006   Hospital  . Osteoarthritis   . Osteopenia   . Other abscess of vulva   . Palpitations   . Syncope and collapse    1 episode    reports that she has quit smoking. She has never used smokeless tobacco. She reports current alcohol use. She reports that she does not use drugs.   Current Outpatient Medications:  .  ACCU-CHEK AVIVA PLUS test strip,  USE TO CHECK BLOOD SUGAR DAILY AS NEEDED. DX: E11.9 (Patient taking differently: 1 each by Other route as needed (to check BGL). DX. E11.9), Disp: 100 each, Rfl: 3 .  acetaminophen (TYLENOL) 325 MG tablet, Take 650 mg by mouth 2 (two) times daily. , Disp: , Rfl:  .  aspirin EC 81 MG tablet, Take 81 mg by mouth at bedtime. , Disp: , Rfl:  .  Blood Glucose Monitoring Suppl (ACCU-CHEK GUIDE) w/Device KIT, 1 each by Does not apply route daily. Use to check blood  sugar daily as needed.  Dx: E11.9, Disp: 1 kit, Rfl: 0 .  Fiber POWD, Take 15 mLs by mouth 3 (three) times daily as needed (constipation)., Disp: , Rfl:  .  fish oil-omega-3 fatty acids 1000 MG capsule, Take 1 g by mouth daily.  , Disp: , Rfl:  .  fluticasone (FLONASE) 50 MCG/ACT nasal spray, Place 2 sprays into both nostrils daily as needed for allergies., Disp: 16 g, Rfl: 3 .  Lancets (FREESTYLE) lancets, CHECK BLOOD SUGAR 1-2 TIMES A DAY. DX: E11.9 NON INSULIN DEPENDENT, Disp: 100 each, Rfl: 3 .  Multiple Vitamin (MULTIVITAMIN WITH MINERALS) TABS tablet, Take 1 tablet by mouth daily., Disp: , Rfl:  .  Polyethyl Glycol-Propyl Glycol (SYSTANE OP), Place 1 drop into both eyes 3 (three) times daily as needed (for dry eyes). , Disp: , Rfl:  .  polyethylene glycol (MIRALAX / GLYCOLAX) packet, Take 17 g by mouth daily as needed for mild constipation. , Disp: , Rfl:  .  ramipril (ALTACE) 5 MG capsule, TAKE 1 CAPSULE (5 MG TOTAL) BY MOUTH DAILY., Disp: 90 capsule, Rfl: 1 .  triamcinolone lotion (KENALOG) 0.1 %, Apply 1 application topically 2 (two) times daily as needed (for itching). , Disp: , Rfl:    Observations/Objective: Blood pressure 122/72, pulse 70, temperature 98.5 F (36.9 C), temperature source Temporal, height _0  (1.6 m), weight 117 lb 4 oz (53.2 kg), SpO2 95 %.  Physical Exam Constitutional:      General: She is not in acute distress.    Appearance: Normal appearance. She is well-developed. She is not ill-appearing or toxic-appearing.  HENT:     Head: Normocephalic.     Right Ear: Hearing, tympanic membrane, ear canal and external ear normal. Tympanic membrane is not erythematous, retracted or bulging.     Left Ear: Hearing, tympanic membrane, ear canal and external ear normal. Tympanic membrane is not erythematous, retracted or bulging.     Nose: No mucosal edema or rhinorrhea.     Right Sinus: No maxillary sinus tenderness or frontal sinus tenderness.     Left Sinus: No  maxillary sinus tenderness or frontal sinus tenderness.     Mouth/Throat:     Pharynx: Uvula midline.  Eyes:     General: Lids are normal. Lids are everted, no foreign bodies appreciated.     Conjunctiva/sclera: Conjunctivae normal.     Pupils: Pupils are equal, round, and reactive to light.  Neck:     Thyroid: No thyroid mass or thyromegaly.     Vascular: No carotid bruit.     Trachea: Trachea normal.  Cardiovascular:     Rate and Rhythm: Normal rate and regular rhythm.     Pulses: Normal pulses.     Heart sounds: Normal heart sounds, S1 normal and S2 normal. No murmur. No friction rub. No gallop.   Pulmonary:     Effort: Pulmonary effort is normal. No tachypnea or respiratory distress.  Breath sounds: Normal breath sounds. No decreased breath sounds, wheezing, rhonchi or rales.  Abdominal:     General: Bowel sounds are normal.     Palpations: Abdomen is soft.     Tenderness: There is no abdominal tenderness.  Musculoskeletal:     Cervical back: Normal range of motion and neck supple.  Skin:    General: Skin is warm and dry.     Findings: No rash.  Neurological:     Mental Status: She is alert.  Psychiatric:        Mood and Affect: Mood is not anxious or depressed.        Speech: Speech normal.        Behavior: Behavior normal. Behavior is cooperative.        Thought Content: Thought content normal.        Judgment: Judgment normal.      Assessment and Plan    Acute bleeding.. unclear source.Marland Kitchen UA  And micro today very no blood in urine.  unremarkable ER exam of recturm ( no hemorrhoids) and only red "dot' noted vaginally by ER MD. No clear need to repeat genital/rectal exam today.  Pt s/P TAH.   No further bleeding. Hemoglobin unchanged in ER.  Unclear if finding on CT abd related to pain in abd or episode of bleeding.   Pt will see GI for possible need for further eval of  Thickening of colon, but given age and one mild episode of bleeding, not currently recurrent  in 85 year old female... would expect likely surveillance instead of extensive work up.    Eliezer Lofts, MD

## 2019-04-29 ENCOUNTER — Ambulatory Visit: Payer: Medicare Other | Admitting: Orthotics

## 2019-04-29 ENCOUNTER — Ambulatory Visit: Payer: Medicare Other | Admitting: Podiatry

## 2019-05-05 ENCOUNTER — Other Ambulatory Visit: Payer: Self-pay

## 2019-05-05 ENCOUNTER — Ambulatory Visit (INDEPENDENT_AMBULATORY_CARE_PROVIDER_SITE_OTHER): Payer: Medicare Other | Admitting: Family Medicine

## 2019-05-05 ENCOUNTER — Encounter: Payer: Self-pay | Admitting: Family Medicine

## 2019-05-05 ENCOUNTER — Telehealth: Payer: Self-pay

## 2019-05-05 VITALS — BP 124/66 | HR 96 | Temp 97.7°F | Ht 63.0 in | Wt 115.1 lb

## 2019-05-05 DIAGNOSIS — K219 Gastro-esophageal reflux disease without esophagitis: Secondary | ICD-10-CM | POA: Diagnosis not present

## 2019-05-05 DIAGNOSIS — R1012 Left upper quadrant pain: Secondary | ICD-10-CM

## 2019-05-05 DIAGNOSIS — K5909 Other constipation: Secondary | ICD-10-CM | POA: Diagnosis not present

## 2019-05-05 NOTE — Telephone Encounter (Signed)
Per appt notes pt already scheduled for 30' appt today with Dr Glori Bickers at 12 noon.

## 2019-05-05 NOTE — Patient Instructions (Signed)
The stomach pain you have may be from acid or constipation or the colon finding (thickening) from your CT scan   Have your virtual GI appointment as planned so they can make a plan  For constipation increase your miralax to twice daily (generic is fine)  Also continue the fiber supplement as you have been taking it   Do try to increase your fluid intake in general   Stay on the omeprazole 20 mg daily (this is your acid blocker- your stomach pain got worse when you skipped it, so don't skip it any more    If symptoms suddenly worsen let us know

## 2019-05-05 NOTE — Telephone Encounter (Signed)
[place] Night - Client TELEPHONE ADVICE RECORD [facility] Patient Name: [name] Gender: Female DOB: 05/26/1922 Age: 86 Y 69 M 24 D Return Phone Number: [phone] (Primary), [phone] (Secondary) Address[place] [address] Client [facility] - Client Client Site [place] Physician Eliezer Lofts - MD Contact Type Call Who Is Calling Patient / Member / Family / Caregiver Call Type Triage / Clinical Caller Name [name] Relationship To Patient Daughter Return Phone Number [phone] (Primary) Chief Complaint Fatigue (>THREE MONTHS) Reason for Call Symptomatic / Request for [place] states her mother has fatigue after eating. Translation No Nurse Assessment Nurse: Hardin Negus, RN, Mardene Celeste Date/Time [name] Time): 05/04/2019 12:42:41 PM Confirm and document reason for call. If symptomatic, describe symptoms. ---Caller states her mother has fatigue after eating. Her mom told her side hurts after she eats. She is very tired. She has felt really fatigued since. No other s&s. She was xrayed at the ER and the only think found was some thickening in her colon. Has the patient had close contact with a person known or suspected to have the novel coronavirus illness OR traveled / lives in area with major community spread (including international travel) in the last 14 days from the onset of symptoms? * If Asymptomatic, screen for exposure and travel within the last 14 days. ---No Does the patient have any new or worsening symptoms? ---Yes Will a triage be completed? ---Yes Related visit to physician within the last 2 weeks? ---No Does the PT have any chronic conditions? (i.e. diabetes, asthma, this includes High risk factors for pregnancy, etc.) ---Yes List chronic conditions. ---diabetes, hypertension Is this a behavioral health or  substance abuse call? ---No Guidelines Guideline Title Affirmed Question Affirmed Notes Nurse Date/Time (Eastern Time) Weakness (Generalized) and Fatigue [1] MODERATE weakness (i.e., interferes with work, school, normal activities) AND [2] persists > 3 days Oren Bracket 05/04/2019 12:55:57 PM PLEASE NOTE: All timestamps contained within this report are represented as [place] Standard Time. CONFIDENTIALTY NOTICE: This fax transmission is intended only for the addressee. It contains information that is legally privileged, confidential or otherwise protected from use or disclosure. If you are not the intended recipient, you are strictly prohibited from reviewing, disclosing, copying using or disseminating any of this information or taking any action in reliance on or regarding this information. If you have received this fax in error, please notify us immediately by telephone so that we can arrange for its return to [place]. Phone: [phone], Toll-Free: [phone], Fax: [phone] Page: 2 of 2 Call Id: [id number] [place]. Time [name] Time) Disposition Final User 05/04/2019 12:38:42 PM Send To RN Personal Richardson Landry, RN, Samantha 05/04/2019 1:06:52 PM See PCP within 24 Hours Yes Hardin Negus, RN, [place] Disagree/Comply Comply Caller Understands Yes PreDisposition Call Doctor Care Advice Given Per Guideline SEE PCP WITHIN 24 HOURS: CARE ADVICE given per Weakness and Fatigue (Adult) guideline. Comments User: Ledora Bottcher, RN Date/Time [name] Time): 05/04/2019 1:00:35 PM The pt has been very stressed about the vaccine. User: Ledora Bottcher, RN Date/Time [name] Time): 05/04/2019 1:04:37 PM Blood sugar 131 Referrals REFERRED TO PCP OFFICE

## 2019-05-05 NOTE — Assessment & Plan Note (Signed)
More discomfort lately  Reviewed hospital records, lab results and studies in detail   Reviewed recent ER note from dec as well as CT scan showing thickened sigmoid area Disc imp of getting more fluids in  Also plan to increase miralax to bid  Keep fiber supplement the same Alert if no improvement and f/u with GI later this mo as planned

## 2019-05-05 NOTE — Telephone Encounter (Signed)
I will see her then  

## 2019-05-05 NOTE — Assessment & Plan Note (Signed)
This has worsened the past 2 d (notably had not taken omeprazole in 2-3 days as well) Disc poss of gerd/acid /gast or colon issue as potential cause (rev CT showing thickened sigmoid area)  Plan to inc miralax to bid  Get back on omeprazole daily  F/u with GI  Drink more fluids Pt voiced understanding and was given inst/plan in writing

## 2019-05-05 NOTE — Progress Notes (Signed)
Subjective:    Patient ID: [name], female    DOB: 24-Oct-1922, 86 y.o.   MRN: [id number]  This visit occurred during the SARS-CoV-2 public health emergency.  Safety protocols were in place, including screening questions prior to the visit, additional usage of staff PPE, and extensive cleaning of exam room while observing appropriate contact time as indicated for disinfecting solutions.    HPI Pt presents for constipation /malaise/abd discomfort   Wt Readings from Last 3 Encounters:  05/05/19 115 lb 1 oz (52.2 kg)  04/22/19 117 lb 4 oz (53.2 kg)  04/03/19 117 lb 8 oz (53.3 kg)   20.38 kg/m   She presents with fatigue - she felt severely tired after eating  Also had pain in L upper abdomen  No nausea or vomiting  Constipation -is a problem (no bm for 2 d before)   Has not sleep well past 2 nights  Forgot yesterday's miralax -so she woke up at 3 am to take it  Very small stool today   No blood in her stool   Chronic constipation is on her problems list  Has used miralax and fiber supplement  She does not drink enough fluids  She also has h/o GERd on ppi (omeprazole 20 mg)  Missed it for a few days and pain got worse   Today feels a lot better  Still a bothersome sensation like a dull ache in her L upper abdomen     CT in ED in December: (went for blood in the toilet)   CT scan showed: IMPRESSION: 1. No evidence of significant acute traumatic injury in the abdomen or pelvis. 2. No acute findings to account for the patient's symptoms. 3. Colonic diverticulosis without evidence to suggest an acute diverticulitis at this time. However, there is a very thick-walled large diverticulum in the mid sigmoid colon, as detailed above. The possibility of colonic neoplasm should be considered, and consideration for follow-up nonemergent colonoscopy is suggested if clinically appropriate given the patient's age and comorbidities. 4. Aortic atherosclerosis, in addition to  least 3 vessel coronary artery disease.  She had no rectal or vaginal bleeding noted at ER visit when this CT was done   Pt states she has a GI appt is 05/21/19 (upcoming)  Will start with a virtual visit    Lab Results  Component Value Date   CREATININE 0.99 04/15/2019   BUN 18 04/15/2019   NA 130 (L) 04/15/2019   K 4.7 04/15/2019   CL 97 (L) 04/15/2019   CO2 22 04/15/2019   Lab Results  Component Value Date   ALT 23 04/15/2019   AST 26 04/15/2019   ALKPHOS 46 04/15/2019   BILITOT 0.8 04/15/2019    Lab Results  Component Value Date   WBC 9.1 04/15/2019   HGB 12.8 04/15/2019   HCT 38.2 04/15/2019   MCV 95.5 04/15/2019   PLT 319 04/15/2019    Patient Active Problem List   Diagnosis Date Noted  . Seborrheic keratoses 10/16/2017  . Hyponatremia 09/14/2017  . Left upper quadrant pain 08/07/2017  . Right hip pain 07/12/2017  . Hearing loss 02/20/2017  . TIA (transient ischemic attack), possible 11/14/2016  . Left carpal tunnel syndrome 08/11/2016  . Epigastric abdominal pain 06/15/2016  . Constipation, chronic 03/21/2016  . Near syncope 05/07/2015  . Osteoarthritis of right shoulder 05/04/2015  . Counseling regarding end of life decision making 01/15/2015  . Skin lesion of right leg 07/10/2014  . Insomnia 04/10/2014  .  Osteoarthritis of right knee 10/14/2013  . Osteoarthritis of left knee 10/14/2013  . Infected sebaceous cyst 11/29/2012  . Gout, chronic, without tophus 12/13/2009  . Osteoporosis 10/29/2009  . PALPITATIONS, OCCASIONAL 07/07/2008  . Allergic rhinitis 06/02/2008  . HEMANGIOMA 07/30/2006  . Controlled type 2 diabetes mellitus without complication, without long-term current use of insulin ([place]) 07/30/2006  . Hyperlipidemia LDL goal <100 07/30/2006  . Essential hypertension, benign 07/30/2006  . GERD 07/30/2006  . RECTAL POLYPS 07/30/2006  . OSTEOARTHRITIS 07/30/2006  . CATARACT, HX OF 07/30/2006   Past Medical History:  Diagnosis Date  . Acute  pharyngitis   . Acute sinusitis, unspecified   . Acute upper respiratory infections of unspecified site   . Allergy   . Anal and rectal polyp   . Cataract    History of  . Cavernous angioma   . Degenerative cervical disc 02/2006  . Diabetes mellitus    Type II  . GERD (gastroesophageal reflux disease)   . Gout   . Hemangioma of unspecified site   . Hyperlipidemia   . Hypertension   . Orthostatic hypotension 04/2006   Hospital  . Osteoarthritis   . Osteopenia   . Other abscess of vulva   . Palpitations   . Syncope and collapse    1 episode   Past Surgical History:  Procedure Laterality Date  . ABDOMINAL HYSTERECTOMY    . Aortic dopplers     Mild plaque.  EEG okay  . CATARACT EXTRACTION    . Cavernous angioma    . Cavernous hemangioma  04/2004   Hospital  . TOTAL KNEE ARTHROPLASTY     Right   Social History   Tobacco Use  . Smoking status: Former Research scientist (life sciences)  . Smokeless tobacco: Never Used  . Tobacco comment: quit 1970  Substance Use Topics  . Alcohol use: Yes    Alcohol/week: 0.0 standard drinks    Comment: wine rarely  . Drug use: No   Family History  Problem Relation Age of Onset  . Heart disease Other        CHF  . Cancer Maternal Aunt        Breast CA   Allergies  Allergen Reactions  . Codeine Nausea And Vomiting  . Statins Other (See Comments)    Pt does not remember the reaction   Current Outpatient Medications on File Prior to Visit  Medication Sig Dispense Refill  . ACCU-CHEK AVIVA PLUS test strip USE TO CHECK BLOOD SUGAR DAILY AS NEEDED. DX: E11.9 (Patient taking differently: 1 each by Other route as needed (to check BGL). DX. E11.9) 100 each 3  . acetaminophen (TYLENOL) 325 MG tablet Take 650 mg by mouth 2 (two) times daily.     Marland Kitchen aspirin EC 81 MG tablet Take 81 mg by mouth at bedtime.     . Blood Glucose Monitoring Suppl (ACCU-CHEK GUIDE) w/Device KIT 1 each by Does not apply route daily. Use to check blood sugar daily as needed.  Dx: E11.9 1  kit 0  . Fiber POWD Take 15 mLs by mouth 3 (three) times daily as needed (constipation).    . fish oil-omega-3 fatty acids 1000 MG capsule Take 1 g by mouth daily.      . fluticasone (FLONASE) 50 MCG/ACT nasal spray Place 2 sprays into both nostrils daily as needed for allergies. 16 g 3  . Lancets (FREESTYLE) lancets CHECK BLOOD SUGAR 1-2 TIMES A DAY. DX: E11.9 NON INSULIN DEPENDENT 100 each 3  .  methylcellulose oral powder Take 1 packet by mouth 3 (three) times daily.    . Multiple Vitamin (MULTIVITAMIN WITH MINERALS) TABS tablet Take 1 tablet by mouth daily.    Marland Kitchen omeprazole (PRILOSEC) 40 MG capsule Take 1 capsule (40 mg total) by mouth daily. 30 capsule 3  . Polyethyl Glycol-Propyl Glycol (SYSTANE OP) Place 1 drop into both eyes 3 (three) times daily as needed (for dry eyes).     . polyethylene glycol (MIRALAX / GLYCOLAX) packet Take 17 g by mouth daily as needed for mild constipation.     . ramipril (ALTACE) 5 MG capsule TAKE 1 CAPSULE (5 MG TOTAL) BY MOUTH DAILY. 90 capsule 1  . triamcinolone lotion (KENALOG) 0.1 % Apply 1 application topically 2 (two) times daily as needed (for itching).      No current facility-administered medications on file prior to visit.    Review of Systems  Constitutional: Positive for appetite change. Negative for activity change, fatigue, fever and unexpected weight change.       Was fatigued over the weekend That is better now   HENT: Negative for congestion, ear pain, rhinorrhea, sinus pressure and sore throat.   Eyes: Negative for pain, redness and visual disturbance.  Respiratory: Negative for cough, shortness of breath and wheezing.   Cardiovascular: Negative for chest pain and palpitations.  Gastrointestinal: Positive for abdominal pain and constipation. Negative for abdominal distention, anal bleeding, blood in stool, diarrhea, nausea, rectal pain and vomiting.  Endocrine: Negative for polydipsia and polyuria.  Genitourinary: Negative for dysuria,  frequency and urgency.  Musculoskeletal: Positive for back pain. Negative for arthralgias and myalgias.       Occ back pain when trying to get comfortable in bed  Skin: Negative for pallor and rash.  Allergic/Immunologic: Negative for environmental allergies.  Neurological: Negative for dizziness, syncope and headaches.  Hematological: Negative for adenopathy. Does not bruise/bleed easily.  Psychiatric/Behavioral: Negative for decreased concentration and dysphoric mood. The patient is not nervous/anxious.        Objective:   Physical Exam Constitutional:      General: She is not in acute distress.    Appearance: She is well-developed and normal weight. She is not ill-appearing or diaphoretic.     Comments: Frail appearing elderly female sitting in chair   HENT:     Head: Normocephalic and atraumatic.     Mouth/Throat:     Mouth: Mucous membranes are moist.  Eyes:     General: No scleral icterus.    Conjunctiva/sclera: Conjunctivae normal.     Pupils: Pupils are equal, round, and reactive to light.  Cardiovascular:     Rate and Rhythm: Normal rate and regular rhythm.     Heart sounds: Normal heart sounds.  Pulmonary:     Effort: Pulmonary effort is normal. No respiratory distress.     Breath sounds: Normal breath sounds. No wheezing or rales.  Abdominal:     General: Abdomen is flat. Bowel sounds are normal. There is no distension or abdominal bruit.     Palpations: Abdomen is soft. There is no shifting dullness, hepatomegaly, splenomegaly, mass or pulsatile mass.     Tenderness: There is abdominal tenderness in the epigastric area and left upper quadrant. There is no right CVA tenderness, left CVA tenderness, guarding or rebound. Negative signs include Murphy's sign and McBurney's sign.     Hernia: No hernia is present.     Comments: Tenderness is mild  Musculoskeletal:     Cervical back: Normal range  of motion and neck supple.  Lymphadenopathy:     Cervical: No cervical  adenopathy.  Skin:    General: Skin is warm and dry.     Coloration: Skin is not pale.     Findings: No erythema or rash.  Neurological:     Mental Status: She is alert.     Cranial Nerves: No cranial nerve deficit.  Psychiatric:        Mood and Affect: Mood normal.     Comments: Pt confuses easily  Did write down all instructions  Very pleasant           Assessment & Plan:   Problem List Items Addressed This Visit      Digestive   GERD    Encouraged pt to continue her omeprazole (since LUQ discomfort worsened after holding it for several days) She is in agreement F/u with GI as planned      Relevant Medications   methylcellulose oral powder   Constipation, chronic    More discomfort lately  Reviewed hospital records, lab results and studies in detail   Reviewed recent ER note from dec as well as CT scan showing thickened sigmoid area Disc imp of getting more fluids in  Also plan to increase miralax to bid  Keep fiber supplement the same Alert if no improvement and f/u with GI later this mo as planned      Relevant Medications   methylcellulose oral powder     Other   Left upper quadrant pain - Primary    This has worsened the past 2 d (notably had not taken omeprazole in 2-3 days as well) Disc poss of gerd/acid /gast or colon issue as potential cause (rev CT showing thickened sigmoid area)  Plan to inc miralax to bid  Get back on omeprazole daily  F/u with GI  Drink more fluids Pt voiced understanding and was given inst/plan in writing

## 2019-05-05 NOTE — Assessment & Plan Note (Signed)
Encouraged pt to continue her omeprazole (since LUQ discomfort worsened after holding it for several days) She is in agreement F/u with GI as planned

## 2019-05-12 ENCOUNTER — Telehealth: Payer: Self-pay | Admitting: Family Medicine

## 2019-05-12 NOTE — Telephone Encounter (Signed)
Okay for her to get vaccine. No history of anaphlaxsis/serious allergic reaction on chart .  If past infeciton... ok to get vaccine  about 45 days after illness onset or positive test.

## 2019-05-12 NOTE — Telephone Encounter (Signed)
Patient called in regards to the covid vaccine. She stated that she is going  Tomorrow @ 9:30 to have the vaccine done.  She wanted to know if she should have these done, if we believe it is okay for her to get this.  Patient also asked if someone had covid if it is okay for them to get the vaccine also   Please advise

## 2019-05-12 NOTE — Telephone Encounter (Signed)
[name] notified as instructed by telephone.  Patient fell 6 weeks ago and was evaluated at the ER.  Now when she is trying to get out of bed or lying down she experiences pain in her right hip but it doesn't hurt when sitting.  She is asking if she should be concerned or will this just go away.  Negative x-rays in ER for hip fx.  Please advise.

## 2019-05-13 ENCOUNTER — Ambulatory Visit: Payer: Medicare Other | Attending: Internal Medicine

## 2019-05-13 DIAGNOSIS — Z23 Encounter for immunization: Secondary | ICD-10-CM | POA: Insufficient documentation

## 2019-05-13 NOTE — Telephone Encounter (Signed)
Continue home stretching, heat... likely mild bursitis. Can use tylenol for pain.

## 2019-05-13 NOTE — Telephone Encounter (Signed)
Ms. Kasinger notified as instructed by telephone.  Patient states understanding. 

## 2019-05-13 NOTE — Progress Notes (Signed)
   Covid-19 Vaccination Clinic  Name:  THRESIA LANZA    MRN: [id number] DOB: 1923-01-10  05/13/2019  Ms. Haberle was observed post Covid-19 immunization for 15 minutes without incidence. She was provided with Vaccine Information Sheet and instruction to access the V-Safe system.   Ms. Maiorana was instructed to call 911 with any severe reactions post vaccine: Marland Kitchen Difficulty breathing  . Swelling of your face and throat  . A fast heartbeat  . A bad rash all over your body  . Dizziness and weakness    Immunizations Administered    Name Date Dose VIS Date Route   Pfizer COVID-19 Vaccine 05/13/2019  9:21 AM 0.3 mL 04/04/2019 Intramuscular   Manufacturer: [employer], [employer]   Lot: [id number]   [employer]: [id number]

## 2019-05-21 ENCOUNTER — Emergency Department (HOSPITAL_COMMUNITY)
Admission: EM | Admit: 2019-05-21 | Discharge: 2019-05-21 | Disposition: A | Discharge status: Home / Self Care | Payer: Medicare Other | Attending: Emergency Medicine | Admitting: Emergency Medicine

## 2019-05-21 ENCOUNTER — Encounter (HOSPITAL_COMMUNITY): Payer: Self-pay | Admitting: Emergency Medicine

## 2019-05-21 ENCOUNTER — Emergency Department (HOSPITAL_COMMUNITY): Payer: Medicare Other

## 2019-05-21 ENCOUNTER — Telehealth: Payer: Self-pay

## 2019-05-21 DIAGNOSIS — I10 Essential (primary) hypertension: Secondary | ICD-10-CM | POA: Insufficient documentation

## 2019-05-21 DIAGNOSIS — R42 Dizziness and giddiness: Secondary | ICD-10-CM | POA: Diagnosis not present

## 2019-05-21 DIAGNOSIS — R55 Syncope and collapse: Secondary | ICD-10-CM | POA: Diagnosis not present

## 2019-05-21 DIAGNOSIS — Z87891 Personal history of nicotine dependence: Secondary | ICD-10-CM | POA: Diagnosis not present

## 2019-05-21 DIAGNOSIS — E119 Type 2 diabetes mellitus without complications: Secondary | ICD-10-CM | POA: Diagnosis not present

## 2019-05-21 DIAGNOSIS — K625 Hemorrhage of anus and rectum: Secondary | ICD-10-CM | POA: Diagnosis not present

## 2019-05-21 DIAGNOSIS — R935 Abnormal findings on diagnostic imaging of other abdominal regions, including retroperitoneum: Secondary | ICD-10-CM | POA: Diagnosis not present

## 2019-05-21 DIAGNOSIS — R531 Weakness: Secondary | ICD-10-CM | POA: Diagnosis not present

## 2019-05-21 DIAGNOSIS — R29810 Facial weakness: Secondary | ICD-10-CM | POA: Diagnosis not present

## 2019-05-21 DIAGNOSIS — Z79899 Other long term (current) drug therapy: Secondary | ICD-10-CM | POA: Insufficient documentation

## 2019-05-21 LAB — URINALYSIS, ROUTINE W REFLEX MICROSCOPIC
Bacteria, UA: NONE SEEN
Bilirubin Urine: NEGATIVE
Glucose, UA: NEGATIVE mg/dL
Ketones, ur: 5 mg/dL — AB
Leukocytes,Ua: NEGATIVE
Nitrite: NEGATIVE
Protein, ur: NEGATIVE mg/dL
Specific Gravity, Urine: 1.008 (ref 1.005–1.030)
pH: 7 (ref 5.0–8.0)

## 2019-05-21 LAB — CBC
HCT: 40.8 % (ref 36.0–46.0)
Hemoglobin: 13.2 g/dL (ref 12.0–15.0)
MCH: 31 pg (ref 26.0–34.0)
MCHC: 32.4 g/dL (ref 30.0–36.0)
MCV: 95.8 fL (ref 80.0–100.0)
Platelets: 291 10*3/uL (ref 150–400)
RBC: 4.26 MIL/uL (ref 3.87–5.11)
RDW: 13.1 % (ref 11.5–15.5)
WBC: 11.1 10*3/uL — ABNORMAL HIGH (ref 4.0–10.5)
nRBC: 0 % (ref 0.0–0.2)

## 2019-05-21 LAB — BASIC METABOLIC PANEL
Anion gap: 11 (ref 5–15)
BUN: 14 mg/dL (ref 8–23)
CO2: 22 mmol/L (ref 22–32)
Calcium: 9.1 mg/dL (ref 8.9–10.3)
Chloride: 96 mmol/L — ABNORMAL LOW (ref 98–111)
Creatinine, Ser: 0.83 mg/dL (ref 0.44–1.00)
GFR calc Af Amer: >60 mL/min (ref >60)
GFR calc non Af Amer: >60 mL/min (ref >60)
Glucose, Bld: 89 mg/dL (ref 70–99)
Potassium: 4.3 mmol/L (ref 3.5–5.1)
Sodium: 129 mmol/L — ABNORMAL LOW (ref 135–145)

## 2019-05-21 MED ORDER — SODIUM CHLORIDE 0.9 % IV BOLUS
1000.0000 mL | Freq: Once | INTRAVENOUS | Status: AC
  Administered 2019-05-21: 1000 mL via INTRAVENOUS

## 2019-05-21 MED ORDER — SODIUM CHLORIDE 0.9 % IV BOLUS: 500.0000 mL | Freq: Once | INTRAVENOUS | Status: DC

## 2019-05-21 MED ORDER — SODIUM CHLORIDE 0.9% FLUSH: 3.0000 mL | Freq: Once | INTRAVENOUS | Status: DC

## 2019-05-21 NOTE — ED Notes (Signed)
Got patient into a gown patient is resting with call bell in reach 

## 2019-05-21 NOTE — ED Notes (Signed)
[phone] daughter margrett elliott please call for updates

## 2019-05-21 NOTE — ED Provider Notes (Signed)
[facility] EMERGENCY DEPARTMENT Provider Note   CSN: [id number] Arrival date & time: 05/21/19  1329     History Chief Complaint  Patient presents with  . Dizziness    [name] is a 86 y.o. female with a past medical history of diabetes, reflux, orthostatic hypotension, previous episodes of syncope.  Patient presents today with feeling of presyncope.  She states that when she awoke this morning to get up to go to the bathroom she felt very dizzy with standing.  She is able to get to the bathroom and back to bed.  She had several more episodes like this.  The last time she had to grab the end of her bed [place] and steady herself.  She denies ataxia, vertigo.  Patient states that her daughter came over and helped her.  She denies any melena, abdominal pain, chest pain, shortness of breath or palpitations.  Patient states that she and her daughter took her blood pressure and it was elevated so she took her regular dose of ramipril.  She denies any other medication changes.  She states she has been eating and drinking normally and tries to drink a lot of water daily.  HPI     Past Medical History:  Diagnosis Date  . Acute pharyngitis   . Acute sinusitis, unspecified   . Acute upper respiratory infections of unspecified site   . Allergy   . Anal and rectal polyp   . Cataract    History of  . Cavernous angioma   . Degenerative cervical disc 02/2006  . Diabetes mellitus    Type II  . GERD (gastroesophageal reflux disease)   . Gout   . Hemangioma of unspecified site   . Hyperlipidemia   . Hypertension   . Orthostatic hypotension 04/2006   Hospital  . Osteoarthritis   . Osteopenia   . Other abscess of vulva   . Palpitations   . Syncope and collapse    1 episode    Patient Active Problem List   Diagnosis Date Noted  . Seborrheic keratoses 10/16/2017  . Hyponatremia 09/14/2017  . Left upper quadrant pain 08/07/2017  . Right hip pain 07/12/2017  .  Hearing loss 02/20/2017  . TIA (transient ischemic attack), possible 11/14/2016  . Left carpal tunnel syndrome 08/11/2016  . Epigastric abdominal pain 06/15/2016  . Constipation, chronic 03/21/2016  . Near syncope 05/07/2015  . Osteoarthritis of right shoulder 05/04/2015  . Counseling regarding end of life decision making 01/15/2015  . Skin lesion of right leg 07/10/2014  . Insomnia 04/10/2014  . Osteoarthritis of right knee 10/14/2013  . Osteoarthritis of left knee 10/14/2013  . Infected sebaceous cyst 11/29/2012  . Gout, chronic, without tophus 12/13/2009  . Osteoporosis 10/29/2009  . PALPITATIONS, OCCASIONAL 07/07/2008  . Allergic rhinitis 06/02/2008  . HEMANGIOMA 07/30/2006  . Controlled type 2 diabetes mellitus without complication, without long-term current use of insulin ([place]) 07/30/2006  . Hyperlipidemia LDL goal <100 07/30/2006  . Essential hypertension, benign 07/30/2006  . GERD 07/30/2006  . RECTAL POLYPS 07/30/2006  . OSTEOARTHRITIS 07/30/2006  . CATARACT, HX OF 07/30/2006    Past Surgical History:  Procedure Laterality Date  . ABDOMINAL HYSTERECTOMY    . Aortic dopplers     Mild plaque.  EEG okay  . CATARACT EXTRACTION    . Cavernous angioma    . Cavernous hemangioma  04/2004   Hospital  . TOTAL KNEE ARTHROPLASTY     Right  OB History   No obstetric history on file.     Family History  Problem Relation Age of Onset  . Heart disease Other        CHF  . Cancer Maternal Aunt        Breast CA    Social History   Tobacco Use  . Smoking status: Former Research scientist (life sciences)  . Smokeless tobacco: Never Used  . Tobacco comment: quit 1970  Substance Use Topics  . Alcohol use: Yes    Alcohol/week: 0.0 standard drinks    Comment: wine rarely  . Drug use: No    Home Medications Prior to Admission medications   Medication Sig Start Date End Date Taking? Authorizing Provider  ACCU-CHEK AVIVA PLUS test strip USE TO CHECK BLOOD SUGAR DAILY AS NEEDED. DX:  E11.9 Patient taking differently: 1 each by Other route as needed (to check BGL). DX. E11.9 06/17/18   Bedsole, Amy E, MD  acetaminophen (TYLENOL) 325 MG tablet Take 650 mg by mouth 2 (two) times daily.     [provider]  aspirin EC 81 MG tablet Take 81 mg by mouth at bedtime.     [provider]  Blood Glucose Monitoring Suppl (ACCU-CHEK GUIDE) w/Device KIT 1 each by Does not apply route daily. Use to check blood sugar daily as needed.  Dx: E11.9 04/30/18   Bedsole, Amy E, MD  Fiber POWD Take 15 mLs by mouth 3 (three) times daily as needed (constipation).    [provider]  fish oil-omega-3 fatty acids 1000 MG capsule Take 1 g by mouth daily.      [provider]  fluticasone (FLONASE) 50 MCG/ACT nasal spray Place 2 sprays into both nostrils daily as needed for allergies. 03/31/19   Bedsole, Amy E, MD  Lancets (FREESTYLE) lancets CHECK BLOOD SUGAR 1-2 TIMES A DAY. DX: E11.9 NON INSULIN DEPENDENT 08/05/18   Bedsole, Amy E, MD  methylcellulose oral powder Take 1 packet by mouth 3 (three) times daily.    [provider]  Multiple Vitamin (MULTIVITAMIN WITH MINERALS) TABS tablet Take 1 tablet by mouth daily.    [provider]  omeprazole (PRILOSEC) 40 MG capsule Take 1 capsule (40 mg total) by mouth daily. 04/22/19   Bedsole, Mervyn Gay, MD  Polyethyl Glycol-Propyl Glycol (SYSTANE OP) Place 1 drop into both eyes 3 (three) times daily as needed (for dry eyes).     [provider]  polyethylene glycol (MIRALAX / GLYCOLAX) packet Take 17 g by mouth daily as needed for mild constipation.     [provider]  ramipril (ALTACE) 5 MG capsule TAKE 1 CAPSULE (5 MG TOTAL) BY MOUTH DAILY. 04/08/19   Bedsole, Amy E, MD  triamcinolone lotion (KENALOG) 0.1 % Apply 1 application topically 2 (two) times daily as needed (for itching).  06/06/18   [provider]    Allergies    Codeine and Statins  Review of Systems   Review of Systems Ten  systems reviewed and are negative for acute change, except as noted in the HPI.   Physical Exam Updated Vital Signs BP (!) 164/98 (BP Location: Right Arm)   Pulse 78   Temp 97.8 F (36.6 C) (Oral)   Resp 16   SpO2 99%   Physical Exam Vitals and nursing note reviewed.  Constitutional:      General: She is not in acute distress.    Appearance: She is well-developed. She is not diaphoretic.  HENT:     Head:  Normocephalic and atraumatic.  Eyes:     General: No scleral icterus.    Extraocular Movements: Extraocular movements intact.     Conjunctiva/sclera: Conjunctivae normal.     Pupils: Pupils are equal, round, and reactive to light.     Comments: No vertical, horizontal, or rotational nystagmus  Cardiovascular:     Rate and Rhythm: Normal rate and regular rhythm.     Heart sounds: Normal heart sounds. No murmur. No friction rub. No gallop.   Pulmonary:     Effort: Pulmonary effort is normal. No respiratory distress.     Breath sounds: Normal breath sounds.  Abdominal:     General: Bowel sounds are normal. There is no distension.     Palpations: Abdomen is soft. There is no mass.     Tenderness: There is no abdominal tenderness. There is no guarding.  Musculoskeletal:     Cervical back: Normal range of motion.  Skin:    General: Skin is warm and dry.  Neurological:     Mental Status: She is alert and oriented to person, place, and time.  Psychiatric:        Behavior: Behavior normal.     ED Results / Procedures / Treatments   Labs (all labs ordered are listed, but only abnormal results are displayed) Labs Reviewed  BASIC METABOLIC PANEL - Abnormal; Notable for the following components:      Result Value   Sodium 129 (*)    Chloride 96 (*)    All other components within normal limits  CBC - Abnormal; Notable for the following components:   WBC 11.1 (*)    All other components within normal limits  URINALYSIS, ROUTINE W REFLEX MICROSCOPIC  CBG MONITORING, ED     EKG EKG Interpretation  Date/Time:  Wednesday May 21 2019 14:47:20 EST Ventricular Rate:  63 PR Interval:    QRS Duration: 97 QT Interval:  448 QTC Calculation: 459 R Axis:   -54 Text Interpretation: Sinus rhythm Left ventricular hypertrophy Inferior infarct, old Anterior infarct, old No significant change since last tracing Confirmed by Dorie Rank [phone]) on 05/21/2019 3:09:48 PM   Radiology DG Chest 1 View  Result Date: 05/21/2019 CLINICAL DATA:  Syncope. EXAM: CHEST  1 VIEW COMPARISON:  03/27/2019 FINDINGS: Lungs are adequately inflated without focal airspace consolidation or effusion. Cardiomediastinal silhouette and remainder of the exam is unchanged. IMPRESSION: No active disease. Electronically Signed   By: Marin Olp M.D.   On: 05/21/2019 15:20    Procedures Procedures (including critical care time)  Medications Ordered in ED Medications  sodium chloride flush (NS) 0.9 % injection 3 mL (has no administration in time range)    ED Course  I have reviewed the triage vital signs and the nursing notes.  Pertinent labs & imaging results that were available during my care of the patient were reviewed by me and considered in my medical decision making (see chart for details).    MDM Rules/Calculators/A&P                     86 year old female here with a history of orthostatic hypotension, previous episodes of syncope and collapse.  Patient appears to have orthostatic lightheadedness.  She denies any vertigo, ataxia, disequilibrium.  Patient was hypertensive earlier prior to arrival with improvement in her blood pressure by taking her oral medications.  I reviewed the patient's CBC.  Her white blood cell count is only mildly elevated.  Hemoglobin is within normal limits.  She denies melena and have low suspicion for GI bleed.  Patient's BMP shows mild hyponatremia.  UA is pending.  Patient's 1 view chest x-ray shows no acute abnormalities on my interpretation.  EKG  shows normal sinus rhythm at a rate of 63 without ischemic changes or arrhythmia.  Patient awaiting urinalysis.  I given signout to [place] who will assume care of the patient.  Patient will need orthostatic vital signs and ambulatory status expect she will be able to be discharged. Final Clinical Impression(s) / ED Diagnoses Final diagnoses:  None    Rx / DC Orders ED Discharge Orders    None       Margarita Mail, PA-C 05/21/19 1540    Dorie Rank, MD 05/22/19 [phone]

## 2019-05-21 NOTE — Telephone Encounter (Signed)
Patient left a voicemail stating that she had talked with a nurse earlier. Patient stated that her blood pressure now is 208/116 and not sure what to do. Patient appears to be at the ER now.

## 2019-05-21 NOTE — Telephone Encounter (Signed)
Pt already has appt on 05/23/19 at 3:20 with Dr Diona Browner.

## 2019-05-21 NOTE — ED Notes (Signed)
Offered pt something to drink. Tolerating well at this time.

## 2019-05-21 NOTE — ED Triage Notes (Signed)
Pt arrives to ED from home where she lives alone- pt states she woke up at 3am to use restroom and was suddenly dizzy. Pt states this lasted until around 0700 then she called her pcp and was advised to come to ER. EMS noted first bp to be 210/110 but last bp was 180/88. EMS also gave 1000mg  tylenol. Pt is alert and ox4.

## 2019-05-21 NOTE — Telephone Encounter (Signed)
[facility] - Client TELEPHONE ADVICE RECORD [facility] Patient Name: [name] Gender: Female DOB: 06-30-1922 Age: 86 Y 66 M 10 D Return Phone Number: [phone] (Primary) Address: [address] City/State/Zip: [place] [address] Client [facility] - Client Client Site [place] - Day Physician Eliezer Lofts - MD Contact Type Call Who Is Calling Patient / Member / Family / Caregiver Call Type Triage / Clinical Relationship To Patient Self Return Phone Number [phone] (Primary) Chief Complaint Dizziness Reason for Call Symptomatic / Request for Health Information Initial Comment Caller states she has been dizzy for several hours this morning and her blood pressure was 187/104. She just took her blood pressure and it is 161/96 but she isn't dizzy anymore. Caller has a doctor appt at [place] at 3:20 pm as well. Translation No Nurse Assessment Nurse: Neena Rhymes, RN, Sharyn Lull Date/Time (Eastern Time): 05/21/2019 11:50:59 AM Confirm and document reason for call. If symptomatic, describe symptoms. ---Caller states she has been dizzy for several hours this morning and her blood pressure was 187/104 prior to taking medication. She just took her blood pressure and it is 161/96 but she isn't dizzy anymore. Caller has a doctor appt at Friday at 3:20 pm as well. Has the patient had close contact with a person known or suspected to have the novel coronavirus illness OR traveled / lives in area with major community spread (including international travel) in the last 14 days from the onset of symptoms? * If Asymptomatic, screen for exposure and travel within the last 14 days. ---No Does the patient have any new or worsening symptoms? ---Yes Will a triage be completed? ---Yes Related visit to physician within the last 2 weeks? ---No Does the PT have any chronic conditions? (i.e. diabetes, asthma, this  includes High risk factors for pregnancy, etc.) ---Yes List chronic conditions. ---HBP,DM BS was 146 at 7:45 am Is this a behavioral health or substance abuse call? ---No Guidelines Guideline Title Affirmed Question Affirmed Notes Nurse Date/Time (Eastern Time) High Blood Pressure Systolic BP >= 0000000 OR Diastolic >= 123XX123 Neena Rhymes, RN, Sharyn Lull 05/21/2019 11:54:16 AM PLEASE NOTE: All timestamps contained within this report are represented as [place] Standard Time. CONFIDENTIALTY NOTICE: This fax transmission is intended only for the addressee. It contains information that is legally privileged, confidential or otherwise protected from use or disclosure. If you are not the intended recipient, you are strictly prohibited from reviewing, disclosing, copying using or disseminating any of this information or taking any action in reliance on or regarding this information. If you have received this fax in error, please notify us immediately by telephone so that we can arrange for its return to [place]. Phone: [phone], Toll-Free: [phone], Fax: [phone] Page: 2 of 2 Call Id: [id number] [place]. Time [name] Time) Disposition Final User 05/21/2019 11:59:01 AM SEE PCP WITHIN 3 DAYS Yes [place], RN, Julio Sicks Disagree/Comply Comply Caller Understands Yes PreDisposition Call Doctor Care Advice Given Per Guideline SEE PCP WITHIN 3 DAYS: * You need to be seen within 2 or 3 days. Call your doctor (or NP/PA) during regular office hours and make an appointment. A clinic or urgent care center are good places to go for care if your doctor's office is closed or you can't get an appointment. NOTE: If office will be open tomorrow, tell caller to call then, not in 3 days. * Your blood pressure is over 180/110 * You become worse. * Weakness or numbness of the  face, arm or leg on one side of the body occurs Referrals REFERRED TO PCP OFFICE

## 2019-05-21 NOTE — ED Provider Notes (Signed)
Care assumed from [place], PA-C at shift change with labs pending. Patient seen by previous team.   In brief, this patient is a 86 y.o. F with PMH/o syncope and orthostatic hypotension who presents for evaluation of lightheadedness that occurs only when she stands.  Patient states she has had this sensation before.  She felt like her blood pressure was high so she took extra dose of her high blood pressure medication today.  Patient states she only feels lightheaded when she stands up from a sitting position.  She states that she does not have any dizziness and is able to walk once she overcomes lightheadedness sensation.  Please see note from previous provider for full history/physical exam.  Physical Exam  BP (!) 154/97   Pulse 74   Temp 97.8 F (36.6 C) (Oral)   Resp 16   SpO2 100%   Physical Exam  Normal gait   ED Course/Procedures     Procedures  Results for orders placed or performed during the hospital encounter of 05/21/19 (from the past 24 hour(s))  Basic metabolic panel     Status: Abnormal   Collection Time: 05/21/19  1:42 PM  Result Value Ref Range   Sodium 129 (L) 135 - 145 mmol/L   Potassium 4.3 3.5 - 5.1 mmol/L   Chloride 96 (L) 98 - 111 mmol/L   CO2 22 22 - 32 mmol/L   Glucose, Bld 89 70 - 99 mg/dL   BUN 14 8 - 23 mg/dL   Creatinine, Ser 0.83 0.44 - 1.00 mg/dL   Calcium 9.1 8.9 - 10.3 mg/dL   GFR calc non Af Amer >60 >60 mL/min   GFR calc Af Amer >60 >60 mL/min   Anion gap 11 5 - 15  CBC     Status: Abnormal   Collection Time: 05/21/19  1:42 PM  Result Value Ref Range   WBC 11.1 (H) 4.0 - 10.5 K/uL   RBC 4.26 3.87 - 5.11 MIL/uL   Hemoglobin 13.2 12.0 - 15.0 g/dL   HCT 40.8 36.0 - 46.0 %   MCV 95.8 80.0 - 100.0 fL   MCH 31.0 26.0 - 34.0 pg   MCHC 32.4 30.0 - 36.0 g/dL   RDW 13.1 11.5 - 15.5 %   Platelets 291 150 - 400 K/uL   nRBC 0.0 0.0 - 0.2 %  Urinalysis, Routine w reflex microscopic     Status: Abnormal   Collection Time: 05/21/19  5:27 PM   Result Value Ref Range   Color, Urine YELLOW YELLOW   APPearance CLEAR CLEAR   Specific Gravity, Urine 1.008 1.005 - 1.030   pH 7.0 5.0 - 8.0   Glucose, UA NEGATIVE NEGATIVE mg/dL   Hgb urine dipstick SMALL (A) NEGATIVE   Bilirubin Urine NEGATIVE NEGATIVE   Ketones, ur 5 (A) NEGATIVE mg/dL   Protein, ur NEGATIVE NEGATIVE mg/dL   Nitrite NEGATIVE NEGATIVE   Leukocytes,Ua NEGATIVE NEGATIVE   RBC / HPF 0-5 0 - 5 RBC/hpf   WBC, UA 0-5 0 - 5 WBC/hpf   Bacteria, UA NONE SEEN NONE SEEN   Squamous Epithelial / LPF 0-5 0 - 5   EKG Interpretation  Date/Time:  Wednesday May 21 2019 14:47:20 EST Ventricular Rate:  63 PR Interval:    QRS Duration: 97 QT Interval:  448 QTC Calculation: 459 R Axis:   -54 Text Interpretation: Sinus rhythm Left ventricular hypertrophy Inferior infarct, old Anterior infarct, old No significant change since last tracing Confirmed by Dorie Rank [phone])  on 05/21/2019 3:09:48 PM    MDM   PLAN: Patient is pending labs for syncope work-up.  At this time, patient does not have any indication for CVA work-up as she does not have any vertigo or dizziness sensation.  Plan to check labs.  If labs are unremarkable patient can ambulate without any difficulty, anticipate discharge home.  MDM: BMP shows Na 129, potassium normal.  BUN and creatinine within normal limits.  CBC shows leukocytosis of 11.1.  UA negative for any infectious etiology.  Patient was slightly orthostatic when going from sitting to standing.  Patient given fluids for sodium and orthostatic hypotension.  I discussed results with patient.  She reports feeling better here in the ED.  She has not had any lightheadedness sensation.  She has not had any chest pain, difficulty breathing.  We will plan to rehydrate and then assess her walking.  I personally ambulated patient to the bathroom along with the nurse.  She was able to ambulate without any difficulty using her cane which she normally uses at  baseline.  She denies any lightheadedness, dizziness sensation.  At this time, expect this was likely due to orthostatic hypotension.  Her history/physical exam not concerning for CVA.  Encouraged follow-up with her primary care doctor.  I discussed the results with her daughter. At this time, patient exhibits no emergent life-threatening condition that require further evaluation in ED or admission. Patient had ample opportunity for questions and discussion. All patient's questions were answered with full understanding. Strict return precautions discussed. Patient expresses understanding and agreement to plan.   Orthostatic VS for the past 24 hrs:  BP- Lying Pulse- Lying BP- Sitting Pulse- Sitting BP- Standing at 0 minutes Pulse- Standing at 0 minutes  05/21/19 1739 145/73 57 159/84 69 149/79 76      1. Lightheaded     Portions of this note were generated with [employer]. Dictation errors may occur despite best attempts at proofreading.    Volanda Napoleon, PA-C 05/21/19 2227    Veryl Speak, MD 05/22/19 1504

## 2019-05-21 NOTE — Discharge Instructions (Signed)
As we discussed, your work-up was reassuring.  Follow-up with your doctor in 3 to 4 days for further evaluation.  Return the emergency department for any chest pain, difficulty breathing, difficulty walking, vomiting or any other worsening concerning symptoms.

## 2019-05-22 NOTE — Telephone Encounter (Signed)
Noted. ER note reviewed.  

## 2019-05-23 ENCOUNTER — Ambulatory Visit (INDEPENDENT_AMBULATORY_CARE_PROVIDER_SITE_OTHER): Payer: Medicare Other | Admitting: Family Medicine

## 2019-05-23 ENCOUNTER — Ambulatory Visit: Payer: Medicare Other | Admitting: Family Medicine

## 2019-05-23 ENCOUNTER — Other Ambulatory Visit: Payer: Self-pay

## 2019-05-23 ENCOUNTER — Encounter: Payer: Self-pay | Admitting: Family Medicine

## 2019-05-23 VITALS — BP 128/81 | HR 80 | Temp 97.8°F | Ht 63.0 in | Wt 115.8 lb

## 2019-05-23 DIAGNOSIS — I10 Essential (primary) hypertension: Secondary | ICD-10-CM

## 2019-05-23 DIAGNOSIS — E871 Hypo-osmolality and hyponatremia: Secondary | ICD-10-CM

## 2019-05-23 NOTE — Patient Instructions (Addendum)
Keep up with fluids. Can take an additional ramipril dose if blood pressure > 160/100 and call office.

## 2019-05-23 NOTE — Progress Notes (Signed)
Chief Complaint  Patient presents with  . Follow-up    [place] - HTN/Dizziness  . Discuss Colonoscopy Options  . Back Pain    History of Present Illness: HPI  86 year old female presents for ER follow up following visit on 05/21/2019.  Seen on with increased BP and dizziness.  Neg eval except Na 129, nml potassium, nml cr, cbc LE 11.1, UA clear Given IVF.  Dizziness lasted about hours.  CBG 147, BP 167-180/100, gradually increased further to 210/101. Wt Readings from Last 3 Encounters:  05/23/19 115 lb 12 oz (52.5 kg)  05/05/19 115 lb 1 oz (52.2 kg)  04/22/19 117 lb 4 oz (53.2 kg)   BP Readings from Last 3 Encounters:  05/23/19 128/81  05/21/19 (!) 154/97  05/05/19 124/66      This visit occurred during the SARS-CoV-2 public health emergency.  Safety protocols were in place, including screening questions prior to the visit, additional usage of staff PPE, and extensive cleaning of exam room while observing appropriate contact time as indicated for disinfecting solutions.   COVID 19 screen:  No recent travel or known exposure to COVID19 The patient denies respiratory symptoms of COVID 19 at this time. The importance of social distancing was discussed today.     Review of Systems  Constitutional: Negative for chills and fever.  HENT: Negative for congestion and ear pain.   Eyes: Negative for pain and redness.  Respiratory: Negative for cough and shortness of breath.   Cardiovascular: Negative for chest pain, palpitations and leg swelling.  Gastrointestinal: Negative for abdominal pain, blood in stool, constipation, diarrhea, nausea and vomiting.  Genitourinary: Negative for dysuria.  Musculoskeletal: Positive for back pain. Negative for falls and myalgias.  Skin: Negative for rash.  Neurological: Positive for dizziness.  Psychiatric/Behavioral: Negative for depression. The patient is not nervous/anxious.       Past Medical History:  Diagnosis Date  . Acute  pharyngitis   . Acute sinusitis, unspecified   . Acute upper respiratory infections of unspecified site   . Allergy   . Anal and rectal polyp   . Cataract    History of  . Cavernous angioma   . Degenerative cervical disc 02/2006  . Diabetes mellitus    Type II  . GERD (gastroesophageal reflux disease)   . Gout   . Hemangioma of unspecified site   . Hyperlipidemia   . Hypertension   . Orthostatic hypotension 04/2006   Hospital  . Osteoarthritis   . Osteopenia   . Other abscess of vulva   . Palpitations   . Syncope and collapse    1 episode    reports that she has quit smoking. She has never used smokeless tobacco. She reports current alcohol use. She reports that she does not use drugs.   Current Outpatient Medications:  .  ACCU-CHEK AVIVA PLUS test strip, USE TO CHECK BLOOD SUGAR DAILY AS NEEDED. DX: E11.9, Disp: 100 each, Rfl: 3 .  acetaminophen (TYLENOL) 325 MG tablet, Take 650 mg by mouth 2 (two) times daily. , Disp: , Rfl:  .  aspirin EC 81 MG tablet, Take 81 mg by mouth at bedtime. , Disp: , Rfl:  .  Blood Glucose Monitoring Suppl (ACCU-CHEK GUIDE) w/Device KIT, 1 each by Does not apply route daily. Use to check blood sugar daily as needed.  Dx: E11.9, Disp: 1 kit, Rfl: 0 .  Fiber POWD, Take by mouth See admin instructions. Mix 1 rounded teaspoonful into 6-8 ounces of  water and drink three times a day, Disp: , Rfl:  .  fish oil-omega-3 fatty acids 1000 MG capsule, Take 1,000 mg by mouth daily with breakfast. , Disp: , Rfl:  .  fluticasone (FLONASE) 50 MCG/ACT nasal spray, Place 2 sprays into both nostrils daily as needed for allergies., Disp: 16 g, Rfl: 3 .  Lancets (FREESTYLE) lancets, CHECK BLOOD SUGAR 1-2 TIMES A DAY. DX: E11.9 NON INSULIN DEPENDENT, Disp: 100 each, Rfl: 3 .  Multiple Vitamins-Minerals (ONE-A-DAY WOMENS 50+ ADVANTAGE) TABS, Take 1 tablet by mouth daily with breakfast., Disp: , Rfl:  .  omeprazole (PRILOSEC) 40 MG capsule, Take 1 capsule (40 mg total) by  mouth daily., Disp: 30 capsule, Rfl: 3 .  polyethylene glycol (MIRALAX / GLYCOLAX) packet, Take 17 g by mouth daily as needed for mild constipation (MIX AND DRINK). , Disp: , Rfl:  .  Propylene Glycol (SYSTANE BALANCE) 0.6 % SOLN, Place 1-2 drops into both eyes daily., Disp: , Rfl:  .  ramipril (ALTACE) 5 MG capsule, TAKE 1 CAPSULE (5 MG TOTAL) BY MOUTH DAILY., Disp: 90 capsule, Rfl: 1 .  triamcinolone lotion (KENALOG) 0.1 %, Apply 1 application topically 2 (two) times daily as needed (for itching). , Disp: , Rfl:    Observations/Objective: Blood pressure 128/81, pulse 80, temperature 97.8 F (36.6 C), temperature source Temporal, height 5' 3"  (1.6 m), weight 115 lb 12 oz (52.5 kg), SpO2 98 %.  Physical Exam Constitutional:      General: She is not in acute distress.    Appearance: Normal appearance. She is well-developed. She is not ill-appearing or toxic-appearing.     Comments: Elderly, using cane, slowed unsteady gait   HENT:     Head: Normocephalic.     Right Ear: Hearing, tympanic membrane, ear canal and external ear normal. Tympanic membrane is not erythematous, retracted or bulging.     Left Ear: Hearing, tympanic membrane, ear canal and external ear normal. Tympanic membrane is not erythematous, retracted or bulging.     Nose: No mucosal edema or rhinorrhea.     Right Sinus: No maxillary sinus tenderness or frontal sinus tenderness.     Left Sinus: No maxillary sinus tenderness or frontal sinus tenderness.     Mouth/Throat:     Pharynx: Uvula midline.  Eyes:     General: Lids are normal. Lids are everted, no foreign bodies appreciated.     Conjunctiva/sclera: Conjunctivae normal.     Pupils: Pupils are equal, round, and reactive to light.  Neck:     Thyroid: No thyroid mass or thyromegaly.     Vascular: No carotid bruit.     Trachea: Trachea normal.  Cardiovascular:     Rate and Rhythm: Normal rate and regular rhythm.     Pulses: Normal pulses.     Heart sounds: Normal  heart sounds, S1 normal and S2 normal. No murmur. No friction rub. No gallop.   Pulmonary:     Effort: Pulmonary effort is normal. No tachypnea or respiratory distress.     Breath sounds: Normal breath sounds. No decreased breath sounds, wheezing, rhonchi or rales.  Abdominal:     General: Bowel sounds are normal.     Palpations: Abdomen is soft.     Tenderness: There is no abdominal tenderness.  Musculoskeletal:     Cervical back: Normal range of motion and neck supple.  Skin:    General: Skin is warm and dry.     Findings: No rash.  Neurological:  Mental Status: She is alert.  Psychiatric:        Mood and Affect: Mood is not anxious or depressed.        Speech: Speech normal.        Behavior: Behavior normal. Behavior is cooperative.        Thought Content: Thought content normal.        Judgment: Judgment normal.      Assessment and Plan   Essential hypertension, benign Now in control. Can take an additional ramipril dose if blood pressure > 160/100 and call office.    Hyponatremia  Likely due to decreased po intake.  Will follow over time.      Eliezer Lofts, MD

## 2019-05-30 ENCOUNTER — Ambulatory Visit: Payer: Medicare Other | Admitting: Family Medicine

## 2019-05-31 ENCOUNTER — Ambulatory Visit: Payer: Medicare Other

## 2019-06-01 ENCOUNTER — Ambulatory Visit: Payer: Medicare Other | Attending: Internal Medicine

## 2019-06-01 DIAGNOSIS — Z23 Encounter for immunization: Secondary | ICD-10-CM | POA: Insufficient documentation

## 2019-06-01 NOTE — Progress Notes (Signed)
   Covid-19 Vaccination Clinic  Name:  [name]    MRN: [id number] DOB: [birth year]  06/01/2019  [name] was observed post Covid-19 immunization for 15 minutes without incidence. She was provided with Vaccine Information Sheet and instruction to access the V-Safe system.   [name] was instructed to call 911 with any severe reactions post vaccine: Marland Kitchen Difficulty breathing  . Swelling of your face and throat  . A fast heartbeat  . A bad rash all over your body  . Dizziness and weakness    Immunizations Administered    Name Date Dose VIS Date Route   Pfizer COVID-19 Vaccine 06/01/2019  1:53 PM 0.3 mL 04/04/2019 Intramuscular   Manufacturer: [place]   Lot: [id number]   [place]: [id number]

## 2019-06-25 NOTE — Assessment & Plan Note (Addendum)
Now in control. Can take an additional ramipril dose if blood pressure > 160/100 and call office.

## 2019-06-25 NOTE — Assessment & Plan Note (Signed)
Likely due to decreased po intake.  Will follow over time.

## 2019-06-27 ENCOUNTER — Telehealth: Payer: Self-pay

## 2019-06-27 NOTE — Telephone Encounter (Signed)
Pt states that she fell a few months ago and wants home PT. I advised her she would need a F2F encounter for that. She is scheduled to see Dr Diona Browner 07-01-19.

## 2019-06-27 NOTE — Telephone Encounter (Signed)
She fell several months ago and has not done well since.

## 2019-07-01 ENCOUNTER — Encounter: Payer: Self-pay | Admitting: Family Medicine

## 2019-07-01 ENCOUNTER — Other Ambulatory Visit: Payer: Self-pay

## 2019-07-01 ENCOUNTER — Ambulatory Visit (INDEPENDENT_AMBULATORY_CARE_PROVIDER_SITE_OTHER): Payer: Medicare Other | Admitting: Family Medicine

## 2019-07-01 VITALS — BP 130/76 | HR 74 | Temp 98.9°F | Ht 63.0 in | Wt 116.8 lb

## 2019-07-01 DIAGNOSIS — M545 Low back pain, unspecified: Secondary | ICD-10-CM

## 2019-07-01 DIAGNOSIS — M85841 Other specified disorders of bone density and structure, right hand: Secondary | ICD-10-CM | POA: Diagnosis not present

## 2019-07-01 DIAGNOSIS — G8929 Other chronic pain: Secondary | ICD-10-CM

## 2019-07-01 DIAGNOSIS — R2689 Other abnormalities of gait and mobility: Secondary | ICD-10-CM | POA: Diagnosis not present

## 2019-07-01 DIAGNOSIS — M85842 Other specified disorders of bone density and structure, left hand: Secondary | ICD-10-CM

## 2019-07-01 DIAGNOSIS — M170 Bilateral primary osteoarthritis of knee: Secondary | ICD-10-CM

## 2019-07-01 NOTE — Patient Instructions (Signed)
We will call to set up home health.  Use four prong cane or walker instead of one prong for stability.

## 2019-07-01 NOTE — Progress Notes (Signed)
Chief Complaint  Patient presents with  . Gait Problem    Wants PT to help her walk better    History of Present Illness: HPI  86 year old female with hiistory of  osteoarthritis in knees, lumbar spine present with gait issues and instability.   She reports  Her gait is more unstable over the past several years.  She has fallen more in last 6 months.  Hard to go from sitting to standing. She uses a cane for stability.    No new numbness or weakness.   No pain in knees or ankles, occ pain in low back.. doing fairly well now with that. Using tylenol prn.   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 she has her daughter do her grocery shopping. Only leaves for Dr. Visits.  This visit occurred during the SARS-CoV-2 public health emergency.  Safety protocols were in place, including screening questions prior to the visit, additional usage of staff PPE, and extensive cleaning of exam room while observing appropriate contact time as indicated for disinfecting solutions.   COVID 19 screen:  No recent travel or known exposure to COVID19 The patient denies respiratory symptoms of COVID 19 at this time. The importance of social distancing was discussed today.     Review of Systems  Constitutional: Negative for chills and fever.  HENT: Negative for congestion and ear pain.   Eyes: Negative for pain and redness.  Respiratory: Negative for cough and shortness of breath.   Cardiovascular: Negative for chest pain, palpitations and leg swelling.  Gastrointestinal: Negative for abdominal pain, blood in stool, constipation, diarrhea, nausea and vomiting.  Genitourinary: Negative for dysuria.  Musculoskeletal: Positive for back pain and joint pain. Negative for falls and myalgias.  Skin: Negative for rash.  Neurological: Positive for weakness. Negative for dizziness.  Psychiatric/Behavioral: Negative for depression. The patient is not nervous/anxious.       Past Medical  History:  Diagnosis Date  . Acute pharyngitis   . Acute sinusitis, unspecified   . Acute upper respiratory infections of unspecified site   . Allergy   . Anal and rectal polyp   . Cataract    History of  . Cavernous angioma   . Degenerative cervical disc 02/2006  . Diabetes mellitus    Type II  . GERD (gastroesophageal reflux disease)   . Gout   . Hemangioma of unspecified site   . Hyperlipidemia   . Hypertension   . Orthostatic hypotension 04/2006   Hospital  . Osteoarthritis   . Osteopenia   . Other abscess of vulva   . Palpitations   . Syncope and collapse    1 episode    reports that she has quit smoking. She has never used smokeless tobacco. She reports current alcohol use. She reports that she does not use drugs.   Current Outpatient Medications:  .  ACCU-CHEK AVIVA PLUS test strip, USE TO CHECK BLOOD SUGAR DAILY AS NEEDED. DX: E11.9, Disp: 100 each, Rfl: 3 .  acetaminophen (TYLENOL) 325 MG tablet, Take 650 mg by mouth 2 (two) times daily. , Disp: , Rfl:  .  aspirin EC 81 MG tablet, Take 81 mg by mouth at bedtime. , Disp: , Rfl:  .  Blood Glucose Monitoring Suppl (ACCU-CHEK GUIDE) w/Device KIT, 1 each by Does not apply route daily. Use to check blood sugar daily as needed.  Dx: E11.9, Disp: 1 kit, Rfl: 0 .  Fiber POWD, Take by mouth See  admin instructions. Mix 1 rounded teaspoonful into 6-8 ounces of water and drink three times a day, Disp: , Rfl:  .  fish oil-omega-3 fatty acids 1000 MG capsule, Take 1,000 mg by mouth daily with breakfast. , Disp: , Rfl:  .  fluticasone (FLONASE) 50 MCG/ACT nasal spray, Place 2 sprays into both nostrils daily as needed for allergies., Disp: 16 g, Rfl: 3 .  Lancets (FREESTYLE) lancets, CHECK BLOOD SUGAR 1-2 TIMES A DAY. DX: E11.9 NON INSULIN DEPENDENT, Disp: 100 each, Rfl: 3 .  Multiple Vitamins-Minerals (ONE-A-DAY WOMENS 50+ ADVANTAGE) TABS, Take 1 tablet by mouth daily with breakfast., Disp: , Rfl:  .  polyethylene glycol (MIRALAX /  GLYCOLAX) packet, Take 17 g by mouth daily as needed for mild constipation (MIX AND DRINK). , Disp: , Rfl:  .  Probiotic Product (PROBIOTIC PO), Take 1 capsule by mouth daily., Disp: , Rfl:  .  Propylene Glycol (SYSTANE BALANCE) 0.6 % SOLN, Place 1-2 drops into both eyes daily., Disp: , Rfl:  .  ramipril (ALTACE) 5 MG capsule, TAKE 1 CAPSULE (5 MG TOTAL) BY MOUTH DAILY., Disp: 90 capsule, Rfl: 1 .  triamcinolone lotion (KENALOG) 0.1 %, Apply 1 application topically 2 (two) times daily as needed (for itching). , Disp: , Rfl:  .  omeprazole (PRILOSEC) 40 MG capsule, Take 1 capsule (40 mg total) by mouth daily. (Patient not taking: Reported on 07/01/2019), Disp: 30 capsule, Rfl: 3   Observations/Objective: Blood pressure 130/76, pulse 74, temperature 98.9 F (37.2 C), temperature source Temporal, height 5' 3"  (1.6 m), weight 116 lb 12 oz (53 kg), SpO2 98 %.  Physical Exam Constitutional:      General: She is not in acute distress.    Appearance: Normal appearance. She is well-developed. She is not ill-appearing or toxic-appearing.  HENT:     Head: Normocephalic.     Right Ear: Hearing, tympanic membrane, ear canal and external ear normal. Tympanic membrane is not erythematous, retracted or bulging.     Left Ear: Hearing, tympanic membrane, ear canal and external ear normal. Tympanic membrane is not erythematous, retracted or bulging.     Nose: No mucosal edema or rhinorrhea.     Right Sinus: No maxillary sinus tenderness or frontal sinus tenderness.     Left Sinus: No maxillary sinus tenderness or frontal sinus tenderness.     Mouth/Throat:     Pharynx: Uvula midline.  Eyes:     General: Lids are normal. Lids are everted, no foreign bodies appreciated.     Conjunctiva/sclera: Conjunctivae normal.     Pupils: Pupils are equal, round, and reactive to light.  Neck:     Thyroid: No thyroid mass or thyromegaly.     Vascular: No carotid bruit.     Trachea: Trachea normal.  Cardiovascular:      Rate and Rhythm: Normal rate and regular rhythm.     Pulses: Normal pulses.     Heart sounds: Normal heart sounds, S1 normal and S2 normal. No murmur. No friction rub. No gallop.   Pulmonary:     Effort: Pulmonary effort is normal. No tachypnea or respiratory distress.     Breath sounds: Normal breath sounds. No decreased breath sounds, wheezing, rhonchi or rales.  Abdominal:     General: Bowel sounds are normal.     Palpations: Abdomen is soft.     Tenderness: There is no abdominal tenderness.  Musculoskeletal:     Cervical back: Normal range of motion and neck supple.  Comments:  Severe deformity from OA in bilateral hands PIP and DIP joints.. limiting grip and function  Bilateral knee deformity and mild swelling, decreased ROM bilateral knees Decreased ROM bialterl shoulders  Skin:    General: Skin is warm and dry.     Findings: No rash.  Neurological:     Mental Status: She is alert and oriented to person, place, and time.     Cranial Nerves: Cranial nerves are intact.     Sensory: Sensation is intact.     Motor: Motor function is intact.     Gait: Gait abnormal.     Comments:  Slow unsteady gait  Psychiatric:        Mood and Affect: Mood is not anxious or depressed.        Speech: Speech normal.        Behavior: Behavior normal. Behavior is cooperative.        Thought Content: Thought content normal.        Judgment: Judgment normal.      Assessment and Plan Bilateral OA of knees hands and lumbar spine  Causing decreased mobility and increased falls.  Pt will benefit from home PT and ST and assessment for assistive devices.  Encouraged pt to try 4 prong cane or walker. Need home safety eval.   pt is homebound given decreased mobility and poor balance.  Eliezer Lofts, MD

## 2019-07-02 ENCOUNTER — Telehealth: Payer: Self-pay

## 2019-07-02 DIAGNOSIS — M85841 Other specified disorders of bone density and structure, right hand: Secondary | ICD-10-CM | POA: Diagnosis not present

## 2019-07-02 DIAGNOSIS — M19011 Primary osteoarthritis, right shoulder: Secondary | ICD-10-CM | POA: Diagnosis not present

## 2019-07-02 DIAGNOSIS — M19012 Primary osteoarthritis, left shoulder: Secondary | ICD-10-CM | POA: Diagnosis not present

## 2019-07-02 DIAGNOSIS — M170 Bilateral primary osteoarthritis of knee: Secondary | ICD-10-CM | POA: Diagnosis not present

## 2019-07-02 DIAGNOSIS — I10 Essential (primary) hypertension: Secondary | ICD-10-CM | POA: Diagnosis not present

## 2019-07-02 DIAGNOSIS — Z9181 History of falling: Secondary | ICD-10-CM | POA: Diagnosis not present

## 2019-07-02 DIAGNOSIS — M109 Gout, unspecified: Secondary | ICD-10-CM | POA: Diagnosis not present

## 2019-07-02 DIAGNOSIS — M47816 Spondylosis without myelopathy or radiculopathy, lumbar region: Secondary | ICD-10-CM | POA: Diagnosis not present

## 2019-07-02 DIAGNOSIS — D1809 Hemangioma of other sites: Secondary | ICD-10-CM | POA: Diagnosis not present

## 2019-07-02 DIAGNOSIS — M5030 Other cervical disc degeneration, unspecified cervical region: Secondary | ICD-10-CM | POA: Diagnosis not present

## 2019-07-02 DIAGNOSIS — Z87891 Personal history of nicotine dependence: Secondary | ICD-10-CM | POA: Diagnosis not present

## 2019-07-02 DIAGNOSIS — M19041 Primary osteoarthritis, right hand: Secondary | ICD-10-CM | POA: Diagnosis not present

## 2019-07-02 DIAGNOSIS — E785 Hyperlipidemia, unspecified: Secondary | ICD-10-CM | POA: Diagnosis not present

## 2019-07-02 DIAGNOSIS — K219 Gastro-esophageal reflux disease without esophagitis: Secondary | ICD-10-CM | POA: Diagnosis not present

## 2019-07-02 DIAGNOSIS — E119 Type 2 diabetes mellitus without complications: Secondary | ICD-10-CM | POA: Diagnosis not present

## 2019-07-02 DIAGNOSIS — G8929 Other chronic pain: Secondary | ICD-10-CM | POA: Diagnosis not present

## 2019-07-02 DIAGNOSIS — M19042 Primary osteoarthritis, left hand: Secondary | ICD-10-CM | POA: Diagnosis not present

## 2019-07-02 DIAGNOSIS — M85842 Other specified disorders of bone density and structure, left hand: Secondary | ICD-10-CM | POA: Diagnosis not present

## 2019-07-02 NOTE — Telephone Encounter (Signed)
[name] PT with [facility] left /vm requesting verbal orders for [facility] PT 1 x a wk for 1 wk; 2 x a wk for 3 wks and 1 x a wk for 7 wks for strengthening and ambulation, balance and safety in home environment.

## 2019-07-02 NOTE — Telephone Encounter (Signed)
Left message for [name] giving her verbal orders for  [facility] PT 1 x a wk for 1 wk; 2 x a wk for 3 wks and 1 x a wk for 7 wks for strengthening and ambulation, balance and safety in home environment.

## 2019-07-03 ENCOUNTER — Telehealth: Payer: Self-pay

## 2019-07-03 DIAGNOSIS — M19011 Primary osteoarthritis, right shoulder: Secondary | ICD-10-CM | POA: Diagnosis not present

## 2019-07-03 DIAGNOSIS — M19042 Primary osteoarthritis, left hand: Secondary | ICD-10-CM | POA: Diagnosis not present

## 2019-07-03 DIAGNOSIS — M19041 Primary osteoarthritis, right hand: Secondary | ICD-10-CM | POA: Diagnosis not present

## 2019-07-03 DIAGNOSIS — M47816 Spondylosis without myelopathy or radiculopathy, lumbar region: Secondary | ICD-10-CM | POA: Diagnosis not present

## 2019-07-03 DIAGNOSIS — M170 Bilateral primary osteoarthritis of knee: Secondary | ICD-10-CM | POA: Diagnosis not present

## 2019-07-03 DIAGNOSIS — M19012 Primary osteoarthritis, left shoulder: Secondary | ICD-10-CM | POA: Diagnosis not present

## 2019-07-03 NOTE — Telephone Encounter (Signed)
[name] OT with [facility] requesting verbal orders for [facility] OT 2 x a wk for 3 wks and 1 x a wk for 1 wk.

## 2019-07-03 NOTE — Telephone Encounter (Signed)
[place] Night - Client Nonclinical Telephone Record Access[facility]urse Client [facility] Night - Client Client Site [place] - Night Physician Eliezer Lofts - MD Contact Type Call Who Is Calling Physician / Provider / Hospital Call Type Provider Call Message Only Reason for Call Request to send message to Office Initial Comment Please call Erlene Quan w/ [facility] @ [phone] needs to get home care orders for Darrol Poke, 2022-05-27. Additional Comment Disp. Time Disposition Final User 07/03/2019 7:45:29 AM General Information Provided Yes Clydene Laming, Amy Call Closed By:  [employer] [employer] Date/Time: 07/03/2019 7:42:47 AM (ET)

## 2019-07-03 NOTE — Telephone Encounter (Signed)
Verbal orders given to [facility] for [facility] OT 2 x a wk for 3 wks and 1 x a wk for 1 wk.

## 2019-07-03 NOTE — Telephone Encounter (Signed)
Unable to speak with [name] with [facility] and left v/m requesting [name] to cb with what specific orders he is requesting.

## 2019-07-08 ENCOUNTER — Telehealth: Payer: Self-pay

## 2019-07-08 ENCOUNTER — Encounter (HOSPITAL_COMMUNITY): Payer: Self-pay

## 2019-07-08 ENCOUNTER — Emergency Department (HOSPITAL_COMMUNITY): Payer: Medicare Other

## 2019-07-08 ENCOUNTER — Emergency Department (HOSPITAL_COMMUNITY)
Admission: EM | Admit: 2019-07-08 | Discharge: 2019-07-08 | Disposition: A | Discharge status: Home / Self Care | Payer: Medicare Other | Attending: Emergency Medicine | Admitting: Emergency Medicine

## 2019-07-08 DIAGNOSIS — W010XXA Fall on same level from slipping, tripping and stumbling without subsequent striking against object, initial encounter: Secondary | ICD-10-CM | POA: Diagnosis not present

## 2019-07-08 DIAGNOSIS — S0993XA Unspecified injury of face, initial encounter: Secondary | ICD-10-CM | POA: Diagnosis not present

## 2019-07-08 DIAGNOSIS — Y92018 Other place in single-family (private) house as the place of occurrence of the external cause: Secondary | ICD-10-CM | POA: Insufficient documentation

## 2019-07-08 DIAGNOSIS — Z79899 Other long term (current) drug therapy: Secondary | ICD-10-CM | POA: Diagnosis not present

## 2019-07-08 DIAGNOSIS — I10 Essential (primary) hypertension: Secondary | ICD-10-CM | POA: Diagnosis not present

## 2019-07-08 DIAGNOSIS — Z7982 Long term (current) use of aspirin: Secondary | ICD-10-CM | POA: Diagnosis not present

## 2019-07-08 DIAGNOSIS — R220 Localized swelling, mass and lump, head: Secondary | ICD-10-CM | POA: Diagnosis not present

## 2019-07-08 DIAGNOSIS — Y998 Other external cause status: Secondary | ICD-10-CM | POA: Diagnosis not present

## 2019-07-08 DIAGNOSIS — E119 Type 2 diabetes mellitus without complications: Secondary | ICD-10-CM | POA: Diagnosis not present

## 2019-07-08 DIAGNOSIS — R0902 Hypoxemia: Secondary | ICD-10-CM | POA: Diagnosis not present

## 2019-07-08 DIAGNOSIS — S0083XA Contusion of other part of head, initial encounter: Secondary | ICD-10-CM | POA: Insufficient documentation

## 2019-07-08 DIAGNOSIS — R609 Edema, unspecified: Secondary | ICD-10-CM | POA: Diagnosis not present

## 2019-07-08 DIAGNOSIS — Z87891 Personal history of nicotine dependence: Secondary | ICD-10-CM | POA: Insufficient documentation

## 2019-07-08 DIAGNOSIS — M542 Cervicalgia: Secondary | ICD-10-CM | POA: Diagnosis not present

## 2019-07-08 DIAGNOSIS — Y9389 Activity, other specified: Secondary | ICD-10-CM | POA: Insufficient documentation

## 2019-07-08 DIAGNOSIS — W19XXXA Unspecified fall, initial encounter: Secondary | ICD-10-CM | POA: Diagnosis not present

## 2019-07-08 DIAGNOSIS — S0990XA Unspecified injury of head, initial encounter: Secondary | ICD-10-CM | POA: Diagnosis not present

## 2019-07-08 DIAGNOSIS — M25551 Pain in right hip: Secondary | ICD-10-CM | POA: Diagnosis not present

## 2019-07-08 DIAGNOSIS — S79911A Unspecified injury of right hip, initial encounter: Secondary | ICD-10-CM | POA: Diagnosis not present

## 2019-07-08 DIAGNOSIS — Z8673 Personal history of transient ischemic attack (TIA), and cerebral infarction without residual deficits: Secondary | ICD-10-CM | POA: Insufficient documentation

## 2019-07-08 DIAGNOSIS — Z96651 Presence of right artificial knee joint: Secondary | ICD-10-CM | POA: Insufficient documentation

## 2019-07-08 DIAGNOSIS — S199XXA Unspecified injury of neck, initial encounter: Secondary | ICD-10-CM | POA: Diagnosis not present

## 2019-07-08 NOTE — Telephone Encounter (Signed)
Agree with EMS eval.

## 2019-07-08 NOTE — ED Provider Notes (Signed)
[facility] DEPT Provider Note   CSN: [id number] Arrival date & time: 07/08/19  1757     History Chief Complaint  Patient presents with  . Fall    [name] is a 86 y.o. female.  86 year old female here for mechanical fall just prior to arrival.  Patient states that she is stridulous of her face but denied having loss of consciousness.  Complains of slight pain to her right hip but can still ambulate.  No neck discomfort.  No numbness or tingling down to her arms or legs.  Denies any nausea.  No chest or abdominal discomfort.  No lower back pain.  Transported by EMS.        Past Medical History:  Diagnosis Date  . Acute pharyngitis   . Acute sinusitis, unspecified   . Acute upper respiratory infections of unspecified site   . Allergy   . Anal and rectal polyp   . Cataract    History of  . Cavernous angioma   . Degenerative cervical disc 02/2006  . Diabetes mellitus    Type II  . GERD (gastroesophageal reflux disease)   . Gout   . Hemangioma of unspecified site   . Hyperlipidemia   . Hypertension   . Orthostatic hypotension 04/2006   Hospital  . Osteoarthritis   . Osteopenia   . Other abscess of vulva   . Palpitations   . Syncope and collapse    1 episode    Patient Active Problem List   Diagnosis Date Noted  . Seborrheic keratoses 10/16/2017  . Hyponatremia 09/14/2017  . Left upper quadrant pain 08/07/2017  . Right hip pain 07/12/2017  . Hearing loss 02/20/2017  . TIA (transient ischemic attack), possible 11/14/2016  . Left carpal tunnel syndrome 08/11/2016  . Epigastric abdominal pain 06/15/2016  . Constipation, chronic 03/21/2016  . Near syncope 05/07/2015  . Osteoarthritis of right shoulder 05/04/2015  . Counseling regarding end of life decision making 01/15/2015  . Skin lesion of right leg 07/10/2014  . Insomnia 04/10/2014  . Osteoarthritis of right knee 10/14/2013  . Osteoarthritis of left knee 10/14/2013    . Infected sebaceous cyst 11/29/2012  . Gout, chronic, without tophus 12/13/2009  . Osteoporosis 10/29/2009  . PALPITATIONS, OCCASIONAL 07/07/2008  . Allergic rhinitis 06/02/2008  . HEMANGIOMA 07/30/2006  . Controlled type 2 diabetes mellitus without complication, without long-term current use of insulin ([place]) 07/30/2006  . Hyperlipidemia LDL goal <100 07/30/2006  . Essential hypertension, benign 07/30/2006  . GERD 07/30/2006  . RECTAL POLYPS 07/30/2006  . OSTEOARTHRITIS 07/30/2006  . CATARACT, HX OF 07/30/2006    Past Surgical History:  Procedure Laterality Date  . ABDOMINAL HYSTERECTOMY    . Aortic dopplers     Mild plaque.  EEG okay  . CATARACT EXTRACTION    . Cavernous angioma    . Cavernous hemangioma  04/2004   Hospital  . [place]     Right     OB History   No obstetric history on file.     Family History  Problem Relation Age of Onset  . Heart disease Other        CHF  . Cancer Maternal Aunt        Breast CA    Social History   Tobacco Use  . Smoking status: Former Research scientist (life sciences)  . Smokeless tobacco: Never Used  . Tobacco comment: quit 1970  Substance Use Topics  . Alcohol use: Yes    Alcohol/week:  0.0 standard drinks    Comment: wine rarely  . Drug use: No    Home Medications Prior to Admission medications   Medication Sig Start Date End Date Taking? Authorizing Provider  ACCU-CHEK AVIVA PLUS test strip USE TO CHECK BLOOD SUGAR DAILY AS NEEDED. DX: E11.9 06/17/18   Bedsole, Amy E, MD  acetaminophen (TYLENOL) 325 MG tablet Take 650 mg by mouth 2 (two) times daily.     [provider]  aspirin EC 81 MG tablet Take 81 mg by mouth at bedtime.     [provider]  Blood Glucose Monitoring Suppl (ACCU-CHEK GUIDE) w/Device KIT 1 each by Does not apply route daily. Use to check blood sugar daily as needed.  Dx: E11.9 04/30/18   [name], MD  Fiber POWD Take by mouth See admin instructions. Mix 1 rounded teaspoonful into 6-8  ounces of water and drink three times a day    [provider]  fish oil-omega-3 fatty acids 1000 MG capsule Take 1,000 mg by mouth daily with breakfast.     [provider]  fluticasone (FLONASE) 50 MCG/ACT nasal spray Place 2 sprays into both nostrils daily as needed for allergies. 03/31/19   Bedsole, Amy E, MD  Lancets (FREESTYLE) lancets CHECK BLOOD SUGAR 1-2 TIMES A DAY. DX: E11.9 NON INSULIN DEPENDENT 08/05/18   Bedsole, Amy E, MD  Multiple Vitamins-Minerals (ONE-A-DAY WOMENS 50+ ADVANTAGE) TABS Take 1 tablet by mouth daily with breakfast.    [provider]  omeprazole (PRILOSEC) 40 MG capsule Take 1 capsule (40 mg total) by mouth daily. Patient not taking: Reported on 07/01/2019 04/22/19   [name], MD  polyethylene glycol (MIRALAX / GLYCOLAX) packet Take 17 g by mouth daily as needed for mild constipation (MIX AND DRINK).     [provider]  Probiotic Product (PROBIOTIC PO) Take 1 capsule by mouth daily.    [provider]  Propylene Glycol (SYSTANE BALANCE) 0.6 % SOLN Place 1-2 drops into both eyes daily.    [provider]  ramipril (ALTACE) 5 MG capsule TAKE 1 CAPSULE (5 MG TOTAL) BY MOUTH DAILY. 04/08/19   Bedsole, Amy E, MD  triamcinolone lotion (KENALOG) 0.1 % Apply 1 application topically 2 (two) times daily as needed (for itching).  06/06/18   [provider]    Allergies    Codeine and Statins  Review of Systems   Review of Systems  All other systems reviewed and are negative.   Physical Exam Updated Vital Signs BP (!) 176/71 (BP Location: Right Arm)   Pulse 73   Temp 98 F (36.7 C) (Oral)   Resp 18   SpO2 99%   Physical Exam Vitals and nursing note reviewed.  Constitutional:      General: She is not in acute distress.    Appearance: Normal appearance. She is well-developed. She is not toxic-appearing.  HENT:     Head:   Eyes:     General: Lids are normal.     Conjunctiva/sclera: Conjunctivae  normal.     Pupils: Pupils are equal, round, and reactive to light.  Neck:     Thyroid: No thyroid mass.     Trachea: No tracheal deviation.  Cardiovascular:     Rate and Rhythm: Normal rate and regular rhythm.     Heart sounds: Normal heart sounds. No murmur. No gallop.   Pulmonary:     Effort: Pulmonary effort is normal. No respiratory distress.     Breath  sounds: Normal breath sounds. No stridor. No decreased breath sounds, wheezing, rhonchi or rales.  Abdominal:     General: Bowel sounds are normal. There is no distension.     Palpations: Abdomen is soft.     Tenderness: There is no abdominal tenderness. There is no rebound.  Musculoskeletal:        General: No tenderness. Normal range of motion.     Cervical back: Normal range of motion and neck supple.       Legs:     Comments: No shortening or rotation of the right lower extremity.  Neurovascular intact  Skin:    General: Skin is warm and dry.     Findings: No abrasion or rash.  Neurological:     Mental Status: She is alert and oriented to person, place, and time.     GCS: GCS eye subscore is 4. GCS verbal subscore is 5. GCS motor subscore is 6.     Cranial Nerves: No cranial nerve deficit.     Sensory: No sensory deficit.     Motor: No weakness or tremor.     Comments: Strength is 5 of 5 in upper as well as lower extremity.  Psychiatric:        Speech: Speech normal.        Behavior: Behavior normal.     ED Results / Procedures / Treatments   Labs (all labs ordered are listed, but only abnormal results are displayed) Labs Reviewed - No data to display  EKG None  Radiology No results found.  Procedures Procedures (including critical care time)  Medications Ordered in ED Medications - No data to display  ED Course  I have reviewed the triage vital signs and the nursing notes.  Pertinent labs & imaging results that were available during my care of the patient were reviewed by me and considered in my  medical decision making (see chart for details).    MDM Rules/Calculators/A&P                      Patient's x-rays reviewed and consistent with facial contusion. Will discharge home Final Clinical Impression(s) / ED Diagnoses Final diagnoses:  None    Rx / DC Orders ED Discharge Orders    None       Lacretia Leigh, MD 07/08/19 1927

## 2019-07-08 NOTE — Telephone Encounter (Signed)
Patient's daughter [name] contacted the office and stated that she had walked into patient's house and she had fallen in her living room floor. She is unsure how long she had been in the floor. The patient states she did not hit her head, but she just recalls hitting the left side of her cheek, which she says is hurting and sore. She also is stating that her neck feels very stiff, and that she just feels "starry eyed." Patient's daughter states that the patient told her she was trying to find her cane, and she must have stumbled over her feet.  Patient's daughter states they are going to go ahead and call 911 and have them come out to evaluate her and see what they need to do. I will route to Dr. Diona Browner as Juluis Rainier.

## 2019-07-08 NOTE — ED Triage Notes (Signed)
Pt presents via EMS with c/o fall that occurred at home approx 1.5 hrs ago. Pt lost her footing and fell. Pt c/o stiffness to her neck and pt has a hematoma to her left cheek. Pt does have a hx of arthritis.

## 2019-07-08 NOTE — ED Notes (Signed)
Daughter called to pick pt up stated she will be here in 20 min. Pt is A/O and ambulatory at discharge. Discharge instruction provided with no questions and a verbalized understanding.

## 2019-07-09 DIAGNOSIS — M19041 Primary osteoarthritis, right hand: Secondary | ICD-10-CM | POA: Diagnosis not present

## 2019-07-09 DIAGNOSIS — M170 Bilateral primary osteoarthritis of knee: Secondary | ICD-10-CM | POA: Diagnosis not present

## 2019-07-09 DIAGNOSIS — M47816 Spondylosis without myelopathy or radiculopathy, lumbar region: Secondary | ICD-10-CM | POA: Diagnosis not present

## 2019-07-09 DIAGNOSIS — M19012 Primary osteoarthritis, left shoulder: Secondary | ICD-10-CM | POA: Diagnosis not present

## 2019-07-09 DIAGNOSIS — M19011 Primary osteoarthritis, right shoulder: Secondary | ICD-10-CM | POA: Diagnosis not present

## 2019-07-09 DIAGNOSIS — M19042 Primary osteoarthritis, left hand: Secondary | ICD-10-CM | POA: Diagnosis not present

## 2019-07-10 DIAGNOSIS — M19041 Primary osteoarthritis, right hand: Secondary | ICD-10-CM | POA: Diagnosis not present

## 2019-07-10 DIAGNOSIS — M19042 Primary osteoarthritis, left hand: Secondary | ICD-10-CM | POA: Diagnosis not present

## 2019-07-10 DIAGNOSIS — M47816 Spondylosis without myelopathy or radiculopathy, lumbar region: Secondary | ICD-10-CM | POA: Diagnosis not present

## 2019-07-10 DIAGNOSIS — M170 Bilateral primary osteoarthritis of knee: Secondary | ICD-10-CM | POA: Diagnosis not present

## 2019-07-10 DIAGNOSIS — M19011 Primary osteoarthritis, right shoulder: Secondary | ICD-10-CM | POA: Diagnosis not present

## 2019-07-10 DIAGNOSIS — M19012 Primary osteoarthritis, left shoulder: Secondary | ICD-10-CM | POA: Diagnosis not present

## 2019-07-11 DIAGNOSIS — M19042 Primary osteoarthritis, left hand: Secondary | ICD-10-CM | POA: Diagnosis not present

## 2019-07-11 DIAGNOSIS — M19012 Primary osteoarthritis, left shoulder: Secondary | ICD-10-CM | POA: Diagnosis not present

## 2019-07-11 DIAGNOSIS — M19011 Primary osteoarthritis, right shoulder: Secondary | ICD-10-CM | POA: Diagnosis not present

## 2019-07-11 DIAGNOSIS — M170 Bilateral primary osteoarthritis of knee: Secondary | ICD-10-CM | POA: Diagnosis not present

## 2019-07-11 DIAGNOSIS — M19041 Primary osteoarthritis, right hand: Secondary | ICD-10-CM | POA: Diagnosis not present

## 2019-07-11 DIAGNOSIS — M47816 Spondylosis without myelopathy or radiculopathy, lumbar region: Secondary | ICD-10-CM | POA: Diagnosis not present

## 2019-07-14 DIAGNOSIS — M19042 Primary osteoarthritis, left hand: Secondary | ICD-10-CM | POA: Diagnosis not present

## 2019-07-14 DIAGNOSIS — M19011 Primary osteoarthritis, right shoulder: Secondary | ICD-10-CM | POA: Diagnosis not present

## 2019-07-14 DIAGNOSIS — M170 Bilateral primary osteoarthritis of knee: Secondary | ICD-10-CM | POA: Diagnosis not present

## 2019-07-14 DIAGNOSIS — M19012 Primary osteoarthritis, left shoulder: Secondary | ICD-10-CM | POA: Diagnosis not present

## 2019-07-14 DIAGNOSIS — M19041 Primary osteoarthritis, right hand: Secondary | ICD-10-CM | POA: Diagnosis not present

## 2019-07-14 DIAGNOSIS — M47816 Spondylosis without myelopathy or radiculopathy, lumbar region: Secondary | ICD-10-CM | POA: Diagnosis not present

## 2019-07-15 DIAGNOSIS — M170 Bilateral primary osteoarthritis of knee: Secondary | ICD-10-CM | POA: Diagnosis not present

## 2019-07-15 DIAGNOSIS — M47816 Spondylosis without myelopathy or radiculopathy, lumbar region: Secondary | ICD-10-CM | POA: Diagnosis not present

## 2019-07-15 DIAGNOSIS — M19041 Primary osteoarthritis, right hand: Secondary | ICD-10-CM | POA: Diagnosis not present

## 2019-07-15 DIAGNOSIS — M19011 Primary osteoarthritis, right shoulder: Secondary | ICD-10-CM | POA: Diagnosis not present

## 2019-07-15 DIAGNOSIS — M19042 Primary osteoarthritis, left hand: Secondary | ICD-10-CM | POA: Diagnosis not present

## 2019-07-15 DIAGNOSIS — M19012 Primary osteoarthritis, left shoulder: Secondary | ICD-10-CM | POA: Diagnosis not present

## 2019-07-16 DIAGNOSIS — M19011 Primary osteoarthritis, right shoulder: Secondary | ICD-10-CM | POA: Diagnosis not present

## 2019-07-16 DIAGNOSIS — M47816 Spondylosis without myelopathy or radiculopathy, lumbar region: Secondary | ICD-10-CM | POA: Diagnosis not present

## 2019-07-16 DIAGNOSIS — M19012 Primary osteoarthritis, left shoulder: Secondary | ICD-10-CM | POA: Diagnosis not present

## 2019-07-16 DIAGNOSIS — M170 Bilateral primary osteoarthritis of knee: Secondary | ICD-10-CM | POA: Diagnosis not present

## 2019-07-16 DIAGNOSIS — M19041 Primary osteoarthritis, right hand: Secondary | ICD-10-CM | POA: Diagnosis not present

## 2019-07-16 DIAGNOSIS — M19042 Primary osteoarthritis, left hand: Secondary | ICD-10-CM | POA: Diagnosis not present

## 2019-07-18 DIAGNOSIS — M170 Bilateral primary osteoarthritis of knee: Secondary | ICD-10-CM | POA: Diagnosis not present

## 2019-07-18 DIAGNOSIS — M19041 Primary osteoarthritis, right hand: Secondary | ICD-10-CM | POA: Diagnosis not present

## 2019-07-18 DIAGNOSIS — M19011 Primary osteoarthritis, right shoulder: Secondary | ICD-10-CM | POA: Diagnosis not present

## 2019-07-18 DIAGNOSIS — M19042 Primary osteoarthritis, left hand: Secondary | ICD-10-CM | POA: Diagnosis not present

## 2019-07-18 DIAGNOSIS — M47816 Spondylosis without myelopathy or radiculopathy, lumbar region: Secondary | ICD-10-CM | POA: Diagnosis not present

## 2019-07-18 DIAGNOSIS — M19012 Primary osteoarthritis, left shoulder: Secondary | ICD-10-CM | POA: Diagnosis not present

## 2019-07-21 ENCOUNTER — Other Ambulatory Visit: Payer: Self-pay | Admitting: Family Medicine

## 2019-07-21 DIAGNOSIS — M19041 Primary osteoarthritis, right hand: Secondary | ICD-10-CM | POA: Diagnosis not present

## 2019-07-21 DIAGNOSIS — M19042 Primary osteoarthritis, left hand: Secondary | ICD-10-CM | POA: Diagnosis not present

## 2019-07-21 DIAGNOSIS — M47816 Spondylosis without myelopathy or radiculopathy, lumbar region: Secondary | ICD-10-CM | POA: Diagnosis not present

## 2019-07-21 DIAGNOSIS — M170 Bilateral primary osteoarthritis of knee: Secondary | ICD-10-CM | POA: Diagnosis not present

## 2019-07-21 DIAGNOSIS — M19011 Primary osteoarthritis, right shoulder: Secondary | ICD-10-CM | POA: Diagnosis not present

## 2019-07-21 DIAGNOSIS — M19012 Primary osteoarthritis, left shoulder: Secondary | ICD-10-CM | POA: Diagnosis not present

## 2019-07-21 NOTE — Telephone Encounter (Signed)
Pt was seen 04/01/19 and 04/03/19.

## 2019-07-22 ENCOUNTER — Telehealth: Payer: Self-pay | Admitting: *Deleted

## 2019-07-22 NOTE — Telephone Encounter (Signed)
Ms. [name] notified as instructed by telephone.  Patient states understanding. 

## 2019-07-22 NOTE — Telephone Encounter (Signed)
She can decrease down the fiber supplement to  twice daily and  hold the miralax if she is having loose stool.

## 2019-07-22 NOTE — Telephone Encounter (Signed)
Patient called stating that she had diarrhea yesterday twice and was not able to eat much. Patient stated that she has not had any today so far. Patient denies any other symptoms other than feeling real tired. .Patient stated she thought that she may need to be tested for covid, but doesn't think so now.  Patient stated that she is taking fiber powder three times a day and sometimes takes Miralex also. Patient stated that she is wondering if taking the fiber powder three times a day is too much? Patient stated that she is having home health PT and OT come out and they have been coming out the same day and it wears her out and she is really tired after the visits. Patient stated that she has left a message for the home health people asking them not to come on the same day. Patient stated that she is real tired and thinks it has to do with the home health visits.

## 2019-07-23 ENCOUNTER — Other Ambulatory Visit: Payer: Self-pay

## 2019-07-23 ENCOUNTER — Telehealth: Payer: Self-pay

## 2019-07-23 ENCOUNTER — Emergency Department (HOSPITAL_COMMUNITY): Payer: Medicare Other

## 2019-07-23 ENCOUNTER — Encounter (HOSPITAL_COMMUNITY): Payer: Self-pay

## 2019-07-23 ENCOUNTER — Emergency Department (HOSPITAL_COMMUNITY)
Admission: EM | Admit: 2019-07-23 | Discharge: 2019-07-23 | Disposition: A | Discharge status: Home / Self Care | Payer: Medicare Other | Attending: Emergency Medicine | Admitting: Emergency Medicine

## 2019-07-23 DIAGNOSIS — Z79899 Other long term (current) drug therapy: Secondary | ICD-10-CM | POA: Insufficient documentation

## 2019-07-23 DIAGNOSIS — Z789 Other specified health status: Secondary | ICD-10-CM

## 2019-07-23 DIAGNOSIS — R531 Weakness: Secondary | ICD-10-CM | POA: Diagnosis not present

## 2019-07-23 DIAGNOSIS — M170 Bilateral primary osteoarthritis of knee: Secondary | ICD-10-CM

## 2019-07-23 DIAGNOSIS — S299XXA Unspecified injury of thorax, initial encounter: Secondary | ICD-10-CM | POA: Diagnosis not present

## 2019-07-23 DIAGNOSIS — E860 Dehydration: Secondary | ICD-10-CM | POA: Diagnosis not present

## 2019-07-23 DIAGNOSIS — Z87891 Personal history of nicotine dependence: Secondary | ICD-10-CM | POA: Diagnosis not present

## 2019-07-23 DIAGNOSIS — Z7982 Long term (current) use of aspirin: Secondary | ICD-10-CM | POA: Diagnosis not present

## 2019-07-23 DIAGNOSIS — Z96651 Presence of right artificial knee joint: Secondary | ICD-10-CM | POA: Insufficient documentation

## 2019-07-23 DIAGNOSIS — Z7409 Other reduced mobility: Secondary | ICD-10-CM

## 2019-07-23 DIAGNOSIS — I10 Essential (primary) hypertension: Secondary | ICD-10-CM | POA: Diagnosis not present

## 2019-07-23 DIAGNOSIS — R5381 Other malaise: Secondary | ICD-10-CM

## 2019-07-23 DIAGNOSIS — E119 Type 2 diabetes mellitus without complications: Secondary | ICD-10-CM | POA: Diagnosis not present

## 2019-07-23 DIAGNOSIS — Z8673 Personal history of transient ischemic attack (TIA), and cerebral infarction without residual deficits: Secondary | ICD-10-CM | POA: Diagnosis not present

## 2019-07-23 DIAGNOSIS — M545 Low back pain, unspecified: Secondary | ICD-10-CM

## 2019-07-23 DIAGNOSIS — G8929 Other chronic pain: Secondary | ICD-10-CM

## 2019-07-23 LAB — CBC WITH DIFFERENTIAL/PLATELET
Abs Immature Granulocytes: 0.05 10*3/uL (ref 0.00–0.07)
Basophils Absolute: 0 10*3/uL (ref 0.0–0.1)
Basophils Relative: 0 %
Eosinophils Absolute: 0.1 10*3/uL (ref 0.0–0.5)
Eosinophils Relative: 1 %
HCT: 38.3 % (ref 36.0–46.0)
Hemoglobin: 12.4 g/dL (ref 12.0–15.0)
Immature Granulocytes: 0 %
Lymphocytes Relative: 11 %
Lymphs Abs: 1.3 10*3/uL (ref 0.7–4.0)
MCH: 31.4 pg (ref 26.0–34.0)
MCHC: 32.4 g/dL (ref 30.0–36.0)
MCV: 97 fL (ref 80.0–100.0)
Monocytes Absolute: 0.9 10*3/uL (ref 0.1–1.0)
Monocytes Relative: 7 %
Neutro Abs: 9.7 10*3/uL — ABNORMAL HIGH (ref 1.7–7.7)
Neutrophils Relative %: 81 %
Platelets: 274 10*3/uL (ref 150–400)
RBC: 3.95 MIL/uL (ref 3.87–5.11)
RDW: 13.6 % (ref 11.5–15.5)
WBC: 12 10*3/uL — ABNORMAL HIGH (ref 4.0–10.5)
nRBC: 0 % (ref 0.0–0.2)

## 2019-07-23 LAB — BASIC METABOLIC PANEL
Anion gap: 9 (ref 5–15)
BUN: 24 mg/dL — ABNORMAL HIGH (ref 8–23)
CO2: 23 mmol/L (ref 22–32)
Calcium: 9.2 mg/dL (ref 8.9–10.3)
Chloride: 97 mmol/L — ABNORMAL LOW (ref 98–111)
Creatinine, Ser: 0.85 mg/dL (ref 0.44–1.00)
GFR calc Af Amer: >60 mL/min (ref >60)
GFR calc non Af Amer: 59 mL/min — ABNORMAL LOW (ref >60)
Glucose, Bld: 103 mg/dL — ABNORMAL HIGH (ref 70–99)
Potassium: 4.8 mmol/L (ref 3.5–5.1)
Sodium: 129 mmol/L — ABNORMAL LOW (ref 135–145)

## 2019-07-23 LAB — URINALYSIS, ROUTINE W REFLEX MICROSCOPIC
Bilirubin Urine: NEGATIVE
Glucose, UA: NEGATIVE mg/dL
Hgb urine dipstick: NEGATIVE
Ketones, ur: NEGATIVE mg/dL
Leukocytes,Ua: NEGATIVE
Nitrite: NEGATIVE
Protein, ur: NEGATIVE mg/dL
Specific Gravity, Urine: 1.013 (ref 1.005–1.030)
pH: 6 (ref 5.0–8.0)

## 2019-07-23 MED ORDER — SODIUM CHLORIDE 0.9 % IV BOLUS
500.0000 mL | Freq: Once | INTRAVENOUS | Status: AC
  Administered 2019-07-23: 500 mL via INTRAVENOUS

## 2019-07-23 NOTE — Telephone Encounter (Signed)
Patient's daughter,[name], called.  [name] lives out of town.  Patient is going to [employer] .  [name] thinks patient shouldn't be living alone. [name] said patient worries a lot and she's gotten worse since she's gotten older.  They're trying to figure out if she can have help come in to the home or should she go to Assisted Living or [facility]. Any information would be helpful.   [name] said if you can't reach [name], you can call her at [phone] or [phone].

## 2019-07-23 NOTE — Discharge Instructions (Signed)
Contact a health care provider if your weakness: Does not improve or gets worse. Affects your ability to think clearly. Affects your ability to do your normal daily activities. Get help right away if you: Develop sudden weakness, especially on one side of your face or body. Have chest pain. Have trouble breathing or shortness of breath. Have problems with your vision. Have trouble talking or swallowing. Have trouble standing or walking. Are light-headed or lose consciousness. 

## 2019-07-23 NOTE — ED Notes (Signed)
Pt aware urine sample needed 

## 2019-07-23 NOTE — ED Triage Notes (Signed)
From home c/o weakness and fatigue  Physical therapy at home and outpatient physical therapy - patient c/o tiredness from having both appointments in one day - "would like to be spread out" Patient would like to be checked out at the ER because of no PCP appointments available  154/96 95% RA 213 CBG - hx diabetes, was taken off of metformin because of hypoglycemia 98.1 temp   AOx4

## 2019-07-23 NOTE — ED Provider Notes (Signed)
[facility] DEPT Provider Note   CSN: [id number] Arrival date & time: 07/23/19  1120     History No chief complaint on file.   [name] is a 86 y.o. female.  Who presents emergency department with a chief complaint of weakness.  The patient had a fall and was seen in the emergency department on 07/08/2019.  She has had some weakness and exhaustion since that time and "just cannot get back on my feet."  Her son is at bedside and supplements the history.  He states that she called her primary care physician who had physical therapy and Occupational Therapy come to the house.  On Monday of this past week the patient had both a PT and and OT visit attended a lot of exercise.  Since that time she has felt "wiped out."  She states that she just feels weak and tired and has been resting more.  She walks with a walker at home and today states that she felt like she could barely lift her legs.  She denies any other weakness in numbness.  She has no fevers or chills.  HPI     Past Medical History:  Diagnosis Date  . Acute pharyngitis   . Acute sinusitis, unspecified   . Acute upper respiratory infections of unspecified site   . Allergy   . Anal and rectal polyp   . Cataract    History of  . Cavernous angioma   . Degenerative cervical disc 02/2006  . Diabetes mellitus    Type II  . GERD (gastroesophageal reflux disease)   . Gout   . Hemangioma of unspecified site   . Hyperlipidemia   . Hypertension   . Orthostatic hypotension 04/2006   Hospital  . Osteoarthritis   . Osteopenia   . Other abscess of vulva   . Palpitations   . Syncope and collapse    1 episode    Patient Active Problem List   Diagnosis Date Noted  . Seborrheic keratoses 10/16/2017  . Hyponatremia 09/14/2017  . Left upper quadrant pain 08/07/2017  . Right hip pain 07/12/2017  . Hearing loss 02/20/2017  . TIA (transient ischemic attack), possible 11/14/2016  . Left carpal  tunnel syndrome 08/11/2016  . Epigastric abdominal pain 06/15/2016  . Constipation, chronic 03/21/2016  . Near syncope 05/07/2015  . Osteoarthritis of right shoulder 05/04/2015  . Counseling regarding end of life decision making 01/15/2015  . Skin lesion of right leg 07/10/2014  . Insomnia 04/10/2014  . Osteoarthritis of right knee 10/14/2013  . Osteoarthritis of left knee 10/14/2013  . Infected sebaceous cyst 11/29/2012  . Gout, chronic, without tophus 12/13/2009  . Osteoporosis 10/29/2009  . PALPITATIONS, OCCASIONAL 07/07/2008  . Allergic rhinitis 06/02/2008  . HEMANGIOMA 07/30/2006  . Controlled type 2 diabetes mellitus without complication, without long-term current use of insulin ([place]) 07/30/2006  . Hyperlipidemia LDL goal <100 07/30/2006  . Essential hypertension, benign 07/30/2006  . GERD 07/30/2006  . RECTAL POLYPS 07/30/2006  . OSTEOARTHRITIS 07/30/2006  . CATARACT, HX OF 07/30/2006    Past Surgical History:  Procedure Laterality Date  . ABDOMINAL HYSTERECTOMY    . Aortic dopplers     Mild plaque.  EEG okay  . CATARACT EXTRACTION    . Cavernous angioma    . Cavernous hemangioma  04/2004   Hospital  . [facility]     Right     OB History   No obstetric history on file.  Family History  Problem Relation Age of Onset  . Heart disease Other        CHF  . Cancer Maternal Aunt        Breast CA    Social History   Tobacco Use  . Smoking status: Former Research scientist (life sciences)  . Smokeless tobacco: Never Used  . Tobacco comment: quit 1970  Substance Use Topics  . Alcohol use: Yes    Alcohol/week: 0.0 standard drinks    Comment: wine rarely  . Drug use: No    Home Medications Prior to Admission medications   Medication Sig Start Date End Date Taking? Authorizing Provider  acetaminophen (TYLENOL) 325 MG tablet Take 650 mg by mouth 2 (two) times daily.    Yes [provider]  aspirin EC 81 MG tablet Take 81 mg by mouth at bedtime.    Yes  [provider]  Fiber POWD Take by mouth See admin instructions. Mix 1 rounded teaspoonful into 6-8 ounces of water and drink three times a day   Yes [provider]  fish oil-omega-3 fatty acids 1000 MG capsule Take 1,000 mg by mouth daily with breakfast.    Yes [provider]  fluticasone (FLONASE) 50 MCG/ACT nasal spray Place 2 sprays into both nostrils daily as needed for allergies. 03/31/19  Yes Bedsole, Amy E, MD  Multiple Vitamins-Minerals (ONE-A-DAY WOMENS 50+ ADVANTAGE) TABS Take 1 tablet by mouth daily with breakfast.   Yes [provider]  omeprazole (PRILOSEC) 40 MG capsule TAKE 1 CAPSULE BY MOUTH EVERY DAY Patient taking differently: Take 40 mg by mouth daily.  07/21/19  Yes Bedsole, Amy E, MD  polyethylene glycol (MIRALAX / GLYCOLAX) packet Take 17 g by mouth daily as needed for mild constipation (MIX AND DRINK).    Yes [provider]  Propylene Glycol (SYSTANE BALANCE) 0.6 % SOLN Place 1-2 drops into both eyes daily.   Yes [provider]  ramipril (ALTACE) 5 MG capsule TAKE 1 CAPSULE (5 MG TOTAL) BY MOUTH DAILY. Patient taking differently: Take 5 mg by mouth daily.  04/08/19  Yes Bedsole, Amy E, MD  ACCU-CHEK AVIVA PLUS test strip USE TO CHECK BLOOD SUGAR DAILY AS NEEDED. DX: E11.9 06/17/18   Bedsole, Amy E, MD  Blood Glucose Monitoring Suppl (ACCU-CHEK GUIDE) w/Device KIT 1 each by Does not apply route daily. Use to check blood sugar daily as needed.  Dx: E11.9 04/30/18   [name], MD  Lancets (FREESTYLE) lancets CHECK BLOOD SUGAR 1-2 TIMES A DAY. DX: E11.9 NON INSULIN DEPENDENT 08/05/18   [name], MD    Allergies    Codeine and Statins  Review of Systems   Review of Systems Ten systems reviewed and are negative for acute change, except as noted in the HPI.   Physical Exam Updated Vital Signs BP (!) 180/64   Pulse 69   Temp 98.4 F (36.9 C) (Oral)   Resp 17   Ht 5' 3"  (1.6 m)   Wt 51.3 kg   SpO2 97%    BMI 20.02 kg/m   Physical Exam Vitals and nursing note reviewed.  Constitutional:      General: She is not in acute distress.    Appearance: She is well-developed. She is not diaphoretic.  HENT:     Head: Normocephalic and atraumatic.  Eyes:     General: No scleral icterus.    Conjunctiva/sclera: Conjunctivae normal.     Pupils: Pupils are equal, round, and reactive to light.  Comments: Intermittent disconjugate gaze of the left eye.  This is chronic  Cardiovascular:     Rate and Rhythm: Normal rate and regular rhythm.     Heart sounds: Normal heart sounds. No murmur. No friction rub. No gallop.   Pulmonary:     Effort: Pulmonary effort is normal. No respiratory distress.     Breath sounds: Normal breath sounds.  Abdominal:     General: Bowel sounds are normal. There is no distension.     Palpations: Abdomen is soft. There is no mass.     Tenderness: There is no abdominal tenderness. There is no guarding.  Musculoskeletal:     Cervical back: Normal range of motion.  Skin:    General: Skin is warm and dry.  Neurological:     Mental Status: She is alert and oriented to person, place, and time. Mental status is at baseline.     Cranial Nerves: No cranial nerve deficit.     Sensory: No sensory deficit.     Motor: No weakness.  Psychiatric:        Behavior: Behavior normal.     ED Results / Procedures / Treatments   Labs (all labs ordered are listed, but only abnormal results are displayed) Labs Reviewed  CBC WITH DIFFERENTIAL/PLATELET - Abnormal; Notable for the following components:      Result Value   WBC 12.0 (*)    Neutro Abs 9.7 (*)    All other components within normal limits  BASIC METABOLIC PANEL - Abnormal; Notable for the following components:   Sodium 129 (*)    Chloride 97 (*)    Glucose, Bld 103 (*)    BUN 24 (*)    GFR calc non Af Amer 59 (*)    All other components within normal limits  URINALYSIS, ROUTINE W REFLEX MICROSCOPIC     EKG None  Radiology DG Chest Port 1 View  Result Date: 07/23/2019 CLINICAL DATA:  Fall. EXAM: PORTABLE CHEST 1 VIEW COMPARISON:  Radiographs 05/21/2019 and 03/27/2019. FINDINGS: 1246 hours. The heart size and mediastinal contours are stable. There is aortic atherosclerosis. The lungs are clear. There is no pleural effusion or pneumothorax. No acute osseous findings are evident. There are degenerative changes throughout the spine and asymmetric right glenohumeral arthropathy. IMPRESSION: Stable chest without evidence of acute cardiopulmonary process. Electronically Signed   By: Richardean Sale M.D.   On: 07/23/2019 12:53    Procedures Procedures (including critical care time)  Medications Ordered in ED Medications  sodium chloride 0.9 % bolus 500 mL (500 mLs Intravenous New Bag/Given 07/23/19 1513)    ED Course  I have reviewed the triage vital signs and the nursing notes.  Pertinent labs & imaging results that were available during my care of the patient were reviewed by me and considered in my medical decision making (see chart for details).    MDM Rules/Calculators/A&P                      86 year old female here with some intermittent weakness. The differential diagnosis of weakness includes but is not limited to neurologic causes (GBS, myasthenia gravis, CVA, MS, ALS, transverse myelitis, spinal cord injury, CVA, botulism, ) and other causes: ACS, Arrhythmia, syncope, orthostatic hypotension, sepsis, hypoglycemia, electrolyte disturbance, hypothyroidism, respiratory failure, symptomatic anemia, dehydration, heat injury, polypharmacy, malignancy.  White blood cell count is slightly elevated but was elevated over her last visit.  She has some hyponatremia which appears to be chronic.  Urine  is without evidence of infection.I personally reviewed the patient's 1 view chest x-ray which shows no acute abnormalities on my interpretation.  The patient does appear to have orthostatic  positive vital signs and she is being given a 500 mL bolus of normal saline.  Patient does not appear to be septic.  She does not appear to have a neurologic cause.  She may follow-up with her primary care physician.  Patient appears otherwise appropriate for discharge at this time Final Clinical Impression(s) / ED Diagnoses Final diagnoses:  None    Rx / DC Orders ED Discharge Orders    None       Margarita Mail, PA-C 07/23/19 [place], DO 07/24/19 1638

## 2019-07-23 NOTE — ED Notes (Signed)
Patient ambulated to the bathroom independently with cane at this time. No obvious weakness or unsteadiness.

## 2019-07-23 NOTE — Telephone Encounter (Signed)
Per chart review tab pt is at [facility] ED. 

## 2019-07-23 NOTE — Telephone Encounter (Signed)
Unable to reach pt or her daughter [name] by phone. Left v/m for pt to cb. Wanting to verify pt went to ED.

## 2019-07-23 NOTE — Telephone Encounter (Signed)
[facility] - Client TELEPHONE ADVICE RECORD [facility] Patient Name: [name] Gender: Female DOB: 07-04-1922 Age: 86 Y 71 M 14 D Return Phone Number: [phone] (Primary) Address: [address] City/State/Zip: [place] [address] Client [facility] - Client Client Site [facility] - Day Physician Eliezer Lofts - MD Contact Type Call Who Is Calling Patient / Member / Family / Caregiver Call Type Triage / Clinical Relationship To Patient Self Return Phone Number [phone] (Primary) Chief Complaint Arm Pain (no known cause) Reason for Call Symptomatic / Request for Health Information Initial Comment Caller states she has weakness in legs and arms, something with eyes, hard to describe, and wondering what to do. Can't hardly move, no dizziness. Translation No Nurse Assessment Nurse: Delafuente, RN, Irma Date/Time (Eastern Time): 07/23/2019 9:45:41 AM Confirm and document reason for call. If symptomatic, describe symptoms. ---Caller states she has pain and weakness to lower/upper extremities. Reports she has difficulty keeping her eyes open. No other symptoms reported. Has the patient had close contact with a person known or suspected to have the novel coronavirus illness OR traveled / lives in area with major community spread (including international travel) in the last 14 days from the onset of symptoms? * If Asymptomatic, screen for exposure and travel within the last 14 days. ---No Does the patient have any new or worsening symptoms? ---Yes Will a triage be completed? ---Yes Related visit to physician within the last 2 weeks? ---No Does the PT have any chronic conditions? (i.e. diabetes, asthma, this includes High risk factors for pregnancy, etc.) ---No Is this a behavioral health or substance abuse call? ---No Guidelines Guideline Title Affirmed Question Affirmed Notes  Nurse Date/Time (Eastern Time) Weakness (Generalized) and Fatigue [1] SEVERE weakness (i.e., unable to walk or barely able to walk, requires support) AND [2] new onset or worsening Delafuente, RN, Irma 07/23/2019 9:48:43 AM PLEASE NOTE: All timestamps contained within this report are represented as [place] Standard Time. CONFIDENTIALTY NOTICE: This fax transmission is intended only for the addressee. It contains information that is legally privileged, confidential or otherwise protected from use or disclosure. If you are not the intended recipient, you are strictly prohibited from reviewing, disclosing, copying using or disseminating any of this information or taking any action in reliance on or regarding this information. If you have received this fax in error, please notify us immediately by telephone so that we can arrange for its return to [place]. Phone: [phone], Toll-Free: [phone], Fax: [phone] Page: 2 of 2 Call Id: [id number] [place]. Time [name] Time) Disposition Final User 07/23/2019 9:56:30 AM Send To RN Personal Delafuente, RN, Irma 07/23/2019 10:00:52 AM 911 Outcome Documentation Delafuente, RN, Irma Reason: Caller stated she contacted her son in law to take her to the hospital. 07/23/2019 9:53:51 AM Call EMS 911 Now Yes Delafuente, RN, Irma Caller Disagree/Comply Comply Caller Understands Yes PreDisposition Call Doctor Care Advice Given Per Guideline CALL EMS 911 NOW: * Immediate medical attention is needed. You need to hang up and call 911 (or an ambulance). * Triager Discretion: I'll call you back in a few minutes to be sure you were able to reach them. BRING MEDICINES: * Please bring a list of your current medicines when you go to the Emergency Department (ER). * It is also a good idea to bring the pill bottles too. This will help the doctor to make certain you are taking the right medicines and the right dose.

## 2019-07-24 DIAGNOSIS — G8929 Other chronic pain: Secondary | ICD-10-CM

## 2019-07-24 DIAGNOSIS — M47816 Spondylosis without myelopathy or radiculopathy, lumbar region: Secondary | ICD-10-CM

## 2019-07-24 DIAGNOSIS — E119 Type 2 diabetes mellitus without complications: Secondary | ICD-10-CM

## 2019-07-24 DIAGNOSIS — M109 Gout, unspecified: Secondary | ICD-10-CM

## 2019-07-24 DIAGNOSIS — M19012 Primary osteoarthritis, left shoulder: Secondary | ICD-10-CM

## 2019-07-24 DIAGNOSIS — I10 Essential (primary) hypertension: Secondary | ICD-10-CM

## 2019-07-24 DIAGNOSIS — M85842 Other specified disorders of bone density and structure, left hand: Secondary | ICD-10-CM

## 2019-07-24 DIAGNOSIS — M170 Bilateral primary osteoarthritis of knee: Secondary | ICD-10-CM | POA: Diagnosis not present

## 2019-07-24 DIAGNOSIS — M19011 Primary osteoarthritis, right shoulder: Secondary | ICD-10-CM | POA: Diagnosis not present

## 2019-07-24 DIAGNOSIS — M19041 Primary osteoarthritis, right hand: Secondary | ICD-10-CM | POA: Diagnosis not present

## 2019-07-24 DIAGNOSIS — M5030 Other cervical disc degeneration, unspecified cervical region: Secondary | ICD-10-CM

## 2019-07-24 DIAGNOSIS — Z9181 History of falling: Secondary | ICD-10-CM

## 2019-07-24 DIAGNOSIS — D1809 Hemangioma of other sites: Secondary | ICD-10-CM

## 2019-07-24 DIAGNOSIS — K219 Gastro-esophageal reflux disease without esophagitis: Secondary | ICD-10-CM

## 2019-07-24 DIAGNOSIS — M19042 Primary osteoarthritis, left hand: Secondary | ICD-10-CM | POA: Diagnosis not present

## 2019-07-24 DIAGNOSIS — Z87891 Personal history of nicotine dependence: Secondary | ICD-10-CM

## 2019-07-24 DIAGNOSIS — M85841 Other specified disorders of bone density and structure, right hand: Secondary | ICD-10-CM

## 2019-07-24 DIAGNOSIS — E785 Hyperlipidemia, unspecified: Secondary | ICD-10-CM

## 2019-07-24 NOTE — Telephone Encounter (Signed)
Call daughter. I think assisted living would be most appropriate for her. I have discussed this with the patient at last few OV and she seems to be agreeable.  She does have Home Health  PT coming out now and  I belive we could have then send a social worker out to help her and family with placement. Let me know if I need to order.

## 2019-07-24 NOTE — Telephone Encounter (Signed)
Margaret notified as instructed by telephone.  She wants to sit down with her sister and mom this weekend to discuss assisted living placement.  She will call back on Monday if they want to move forward with the social worker referral.

## 2019-07-25 DIAGNOSIS — M19042 Primary osteoarthritis, left hand: Secondary | ICD-10-CM | POA: Diagnosis not present

## 2019-07-25 DIAGNOSIS — M19012 Primary osteoarthritis, left shoulder: Secondary | ICD-10-CM | POA: Diagnosis not present

## 2019-07-25 DIAGNOSIS — M19011 Primary osteoarthritis, right shoulder: Secondary | ICD-10-CM | POA: Diagnosis not present

## 2019-07-25 DIAGNOSIS — M170 Bilateral primary osteoarthritis of knee: Secondary | ICD-10-CM | POA: Diagnosis not present

## 2019-07-25 DIAGNOSIS — M47816 Spondylosis without myelopathy or radiculopathy, lumbar region: Secondary | ICD-10-CM | POA: Diagnosis not present

## 2019-07-25 DIAGNOSIS — M19041 Primary osteoarthritis, right hand: Secondary | ICD-10-CM | POA: Diagnosis not present

## 2019-07-28 ENCOUNTER — Other Ambulatory Visit: Payer: Self-pay

## 2019-07-28 ENCOUNTER — Telehealth: Payer: Self-pay | Admitting: *Deleted

## 2019-07-28 ENCOUNTER — Ambulatory Visit (INDEPENDENT_AMBULATORY_CARE_PROVIDER_SITE_OTHER): Payer: Medicare Other | Admitting: Podiatry

## 2019-07-28 ENCOUNTER — Encounter: Payer: Self-pay | Admitting: Podiatry

## 2019-07-28 DIAGNOSIS — M170 Bilateral primary osteoarthritis of knee: Secondary | ICD-10-CM | POA: Diagnosis not present

## 2019-07-28 DIAGNOSIS — M47816 Spondylosis without myelopathy or radiculopathy, lumbar region: Secondary | ICD-10-CM | POA: Diagnosis not present

## 2019-07-28 DIAGNOSIS — M2041 Other hammer toe(s) (acquired), right foot: Secondary | ICD-10-CM

## 2019-07-28 DIAGNOSIS — E119 Type 2 diabetes mellitus without complications: Secondary | ICD-10-CM

## 2019-07-28 DIAGNOSIS — M19041 Primary osteoarthritis, right hand: Secondary | ICD-10-CM | POA: Diagnosis not present

## 2019-07-28 DIAGNOSIS — B351 Tinea unguium: Secondary | ICD-10-CM | POA: Diagnosis not present

## 2019-07-28 DIAGNOSIS — L84 Corns and callosities: Secondary | ICD-10-CM

## 2019-07-28 DIAGNOSIS — M19011 Primary osteoarthritis, right shoulder: Secondary | ICD-10-CM | POA: Diagnosis not present

## 2019-07-28 DIAGNOSIS — M2042 Other hammer toe(s) (acquired), left foot: Secondary | ICD-10-CM

## 2019-07-28 DIAGNOSIS — M79674 Pain in right toe(s): Secondary | ICD-10-CM | POA: Diagnosis not present

## 2019-07-28 DIAGNOSIS — M19042 Primary osteoarthritis, left hand: Secondary | ICD-10-CM | POA: Diagnosis not present

## 2019-07-28 DIAGNOSIS — M79675 Pain in left toe(s): Secondary | ICD-10-CM | POA: Diagnosis not present

## 2019-07-28 DIAGNOSIS — M19012 Primary osteoarthritis, left shoulder: Secondary | ICD-10-CM | POA: Diagnosis not present

## 2019-07-28 NOTE — Progress Notes (Addendum)
Subjective: [name] presents today for for annual diabetic foot examination and callus(es) right great toe and painful mycotic toenails b/l that are difficult to trim. Pain interferes with ambulation. Aggravating factors include wearing enclosed shoe gear. Pain is relieved with periodic professional debridement.   [name] is requesting diabetic shoes on today's visit.  Past Medical History:  Diagnosis Date  . Acute pharyngitis   . Acute sinusitis, unspecified   . Acute upper respiratory infections of unspecified site   . Allergy   . Anal and rectal polyp   . Cataract    History of  . Cavernous angioma   . Degenerative cervical disc 02/2006  . Diabetes mellitus    Type II  . GERD (gastroesophageal reflux disease)   . Gout   . Hemangioma of unspecified site   . Hyperlipidemia   . Hypertension   . Orthostatic hypotension 04/2006   Hospital  . Osteoarthritis   . Osteopenia   . Other abscess of vulva   . Palpitations   . Syncope and collapse    1 episode    [name] Active Problem List   Diagnosis Date Noted  . Seborrheic keratoses 10/16/2017  . Hyponatremia 09/14/2017  . Left upper quadrant pain 08/07/2017  . Right hip pain 07/12/2017  . Hearing loss 02/20/2017  . TIA (transient ischemic attack), possible 11/14/2016  . Left carpal tunnel syndrome 08/11/2016  . Epigastric abdominal pain 06/15/2016  . Constipation, chronic 03/21/2016  . Near syncope 05/07/2015  . Osteoarthritis of right shoulder 05/04/2015  . Counseling regarding end of life decision making 01/15/2015  . Skin lesion of right leg 07/10/2014  . Insomnia 04/10/2014  . Osteoarthritis of right knee 10/14/2013  . Osteoarthritis of left knee 10/14/2013  . Infected sebaceous cyst 11/29/2012  . Gout, chronic, without tophus 12/13/2009  . Osteoporosis 10/29/2009  . PALPITATIONS, OCCASIONAL 07/07/2008  . Allergic rhinitis 06/02/2008  . HEMANGIOMA 07/30/2006  . Controlled type 2 diabetes mellitus without  complication, without long-term current use of insulin ([place]) 07/30/2006  . Hyperlipidemia LDL goal <100 07/30/2006  . Essential hypertension, benign 07/30/2006  . GERD 07/30/2006  . RECTAL POLYPS 07/30/2006  . OSTEOARTHRITIS 07/30/2006  . CATARACT, HX OF 07/30/2006    Past Surgical History:  Procedure Laterality Date  . ABDOMINAL HYSTERECTOMY    . Aortic dopplers     Mild plaque.  EEG okay  . CATARACT EXTRACTION    . Cavernous angioma    . Cavernous hemangioma  04/2004   Hospital  . TOTAL KNEE ARTHROPLASTY     Right    Current Outpatient Medications on File Prior to Visit  Medication Sig Dispense Refill  . ACCU-CHEK AVIVA PLUS test strip USE TO CHECK BLOOD SUGAR DAILY AS NEEDED. DX: E11.9 100 each 3  . acetaminophen (TYLENOL) 325 MG tablet Take 650 mg by mouth 2 (two) times daily.     Marland Kitchen aspirin EC 81 MG tablet Take 81 mg by mouth at bedtime.     . Blood Glucose Monitoring Suppl (ACCU-CHEK GUIDE) w/Device KIT 1 each by Does not apply route daily. Use to check blood sugar daily as needed.  Dx: E11.9 1 kit 0  . Fiber POWD Take by mouth See admin instructions. Mix 1 rounded teaspoonful into 6-8 ounces of water and drink three times a day    . fish oil-omega-3 fatty acids 1000 MG capsule Take 1,000 mg by mouth daily with breakfast.     . fluticasone (FLONASE) 50 MCG/ACT nasal spray Place 2  sprays into both nostrils daily as needed for allergies. 16 g 3  . Lancets (FREESTYLE) lancets CHECK BLOOD SUGAR 1-2 TIMES A DAY. DX: E11.9 NON INSULIN DEPENDENT 100 each 3  . Multiple Vitamins-Minerals (ONE-A-DAY WOMENS 50+ ADVANTAGE) TABS Take 1 tablet by mouth daily with breakfast.    . omeprazole (PRILOSEC) 40 MG capsule TAKE 1 CAPSULE BY MOUTH EVERY DAY ([name] taking differently: Take 40 mg by mouth daily. ) 90 capsule 1  . polyethylene glycol (MIRALAX / GLYCOLAX) packet Take 17 g by mouth daily as needed for mild constipation (MIX AND DRINK).     . Propylene Glycol (SYSTANE BALANCE) 0.6 % SOLN  Place 1-2 drops into both eyes daily.    . ramipril (ALTACE) 5 MG capsule TAKE 1 CAPSULE (5 MG TOTAL) BY MOUTH DAILY. ([name] taking differently: Take 5 mg by mouth daily. ) 90 capsule 1   No current facility-administered medications on file prior to visit.     Allergies  Allergen Reactions  . Codeine Nausea And Vomiting  . Statins Other (See Comments)    Pt does not remember the reaction    Social History   Occupational History  . 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 Smoking status: Former Research scientist (life sciences)  . Smokeless tobacco: Never Used  . Tobacco comment: quit 1970  Substance and Sexual Activity  . Alcohol use: Yes    Alcohol/week: 0.0 standard drinks    Comment: wine rarely  . Drug use: No  . Sexual activity: Not on file    Family History  Problem Relation Age of Onset  . Heart disease Other        CHF  . Cancer Maternal Aunt        Breast CA    Immunization History  Administered Date(s) Administered  . Influenza Split 01/16/2012  . Influenza Whole 01/27/2003, 01/25/2007, 02/14/2011  . Influenza, High Dose Seasonal PF 01/14/2014, 01/18/2016, 01/29/2017, 12/14/2017, 01/15/2019  . Influenza,inj,Quad PF,6+ Mos 01/19/2015  . PFIZER SARS-COV-2 Vaccination 05/13/2019, 06/01/2019  . Pneumococcal Conjugate-13 10/07/2013  . Pneumococcal Polysaccharide-23 02/22/1994  . Td 04/24/1997, 10/29/2009  . Zoster 02/28/2008     Objective: There were no vitals filed for this visit.  [name] is a/an 86 y.o. Caucasian female WD, WN in NAD. AAO X 3.  Vascular Examination: Capillary fill time to digits <3 seconds b/l. Palpable DP pulses b/l. Palpable PT pulses b/l. Pedal hair absent b/l Skin temperature gradient within normal limits b/l.  Dermatological Examination: Pedal skin is thin shiny, atrophic bilaterally. No open wounds bilaterally. No interdigital macerations bilaterally. Toenails 1-5 b/l elongated,  dystrophic, thickened, crumbly with subungual debris and tenderness to dorsal palpation. Hyperkeratotic lesion(s) R hallux.  No erythema, no edema, no drainage, no flocculence.  Musculoskeletal Examination: Normal muscle strength 5/5 to all lower extremity muscle groups bilaterally, no pain crepitus or joint limitation noted with ROM b/l, bunion deformity noted b/l, hammertoes noted to the  2-5 bilaterally and utilizes cane for ambulation assistance.  Neurological Examination: Protective sensation intact 5/5 intact bilaterally with 10g monofilament b/l.  Hemoglobin A1C Latest Ref Rng & Units 02/18/2019 10/08/2018  HGBA1C 4.6 - 6.5 % 5.8 5.9  Some recent data might be hidden   Assessment: 1. Pain due to onychomycosis of toenails of both feet   2. Callus   3. Acquired hammertoes of both feet   4. Controlled type 2 diabetes mellitus without complication, without long-term current use  of insulin ([place])   5. Encounter for diabetic foot exam ([place])     Plan: -Diabetic foot examination performed on today's visit. -Continue diabetic foot care principles. Literature dispensed on today.  -Toenails 1-5 b/l were debrided in length and girth with sterile nail nippers and dremel without iatrogenic bleeding.  -Callus(es) R hallux were debrided without complication or incident. Total number debrided =1. -[name] to continue soft, supportive shoe gear daily. Start procedure for diabetic shoes. [name] qualifies based on diagnoses. -[name] to report any pedal injuries to medical professional immediately. -[name]/[name] to call should there be question/concern in the interim.  Return in about 3 months (around 10/27/2019) for diabetic nail and callus trim.

## 2019-07-28 NOTE — Telephone Encounter (Signed)
Verbal orders given to [facility] to extend OT 2 x week for four more weeks.

## 2019-07-28 NOTE — Telephone Encounter (Signed)
[name] OT with [facility] left a voicemail requesting an extension on her OT. [name] requested twice a week for four more weeks.

## 2019-07-28 NOTE — Telephone Encounter (Signed)
[name] called back and would like to move forward with the social worker referral.  They would prefer to meet with her on a Monday or Friday.

## 2019-07-28 NOTE — Patient Instructions (Signed)
Diabetes Mellitus and Foot Care Foot care is an important part of your health, especially when you have diabetes. Diabetes may cause you to have problems because of poor blood flow (circulation) to your feet and legs, which can cause your skin to:  Become thinner and drier.  Break more easily.  Heal more slowly.  Peel and crack. You may also have nerve damage (neuropathy) in your legs and feet, causing decreased feeling in them. This means that you may not notice minor injuries to your feet that could lead to more serious problems. Noticing and addressing any potential problems early is the best way to prevent future foot problems. How to care for your feet Foot hygiene  Wash your feet daily with warm water and mild soap. Do not use hot water. Then, pat your feet and the areas between your toes until they are completely dry. Do not soak your feet as this can dry your skin.  Trim your toenails straight across. Do not dig under them or around the cuticle. File the edges of your nails with an emery board or nail file.  Apply a moisturizing lotion or petroleum jelly to the skin on your feet and to dry, brittle toenails. Use lotion that does not contain alcohol and is unscented. Do not apply lotion between your toes. Shoes and socks  Wear clean socks or stockings every day. Make sure they are not too tight. Do not wear knee-high stockings since they may decrease blood flow to your legs.  Wear shoes that fit properly and have enough cushioning. Always look in your shoes before you put them on to be sure there are no objects inside.  To break in new shoes, wear them for just a few hours a day. This prevents injuries on your feet. Wounds, scrapes, corns, and calluses  Check your feet daily for blisters, cuts, bruises, sores, and redness. If you cannot see the bottom of your feet, use a mirror or ask someone for help.  Do not cut corns or calluses or try to remove them with medicine.  If you  find a minor scrape, cut, or break in the skin on your feet, keep it and the skin around it clean and dry. You may clean these areas with mild soap and water. Do not clean the area with peroxide, alcohol, or iodine.  If you have a wound, scrape, corn, or callus on your foot, look at it several times a day to make sure it is healing and not infected. Check for: ? Redness, swelling, or pain. ? Fluid or blood. ? Warmth. ? Pus or a bad smell. General instructions  Do not cross your legs. This may decrease blood flow to your feet.  Do not use heating pads or hot water bottles on your feet. They may burn your skin. If you have lost feeling in your feet or legs, you may not know this is happening until it is too late.  Protect your feet from hot and cold by wearing shoes, such as at the beach or on hot pavement.  Schedule a complete foot exam at least once a year (annually) or more often if you have foot problems. If you have foot problems, report any cuts, sores, or bruises to your health care provider immediately. Contact a health care provider if:  You have a medical condition that increases your risk of infection and you have any cuts, sores, or bruises on your feet.  You have an injury that is not   healing.  You have redness on your legs or feet.  You feel burning or tingling in your legs or feet.  You have pain or cramps in your legs and feet.  Your legs or feet are numb.  Your feet always feel cold.  You have pain around a toenail. Get help right away if:  You have a wound, scrape, corn, or callus on your foot and: ? You have pain, swelling, or redness that gets worse. ? You have fluid or blood coming from the wound, scrape, corn, or callus. ? Your wound, scrape, corn, or callus feels warm to the touch. ? You have pus or a bad smell coming from the wound, scrape, corn, or callus. ? You have a fever. ? You have a red line going up your leg. Summary  Check your feet every day  for cuts, sores, red spots, swelling, and blisters.  Moisturize feet and legs daily.  Wear shoes that fit properly and have enough cushioning.  If you have foot problems, report any cuts, sores, or bruises to your health care provider immediately.  Schedule a complete foot exam at least once a year (annually) or more often if you have foot problems. This information is not intended to replace advice given to you by your health care provider. Make sure you discuss any questions you have with your health care provider. Document Revised: 01/01/2019 Document Reviewed: 05/12/2016 Elsevier Patient Education  2020 [employer]  

## 2019-07-29 NOTE — Telephone Encounter (Signed)
Referral sent.Marland Kitchen let daughter or patient know.

## 2019-07-29 NOTE — Addendum Note (Signed)
Addended by: Eliezer Lofts E on: 07/29/2019 05:41 PM   Modules accepted: Orders

## 2019-07-30 ENCOUNTER — Telehealth: Payer: Self-pay

## 2019-07-30 DIAGNOSIS — M170 Bilateral primary osteoarthritis of knee: Secondary | ICD-10-CM | POA: Diagnosis not present

## 2019-07-30 DIAGNOSIS — M19041 Primary osteoarthritis, right hand: Secondary | ICD-10-CM | POA: Diagnosis not present

## 2019-07-30 DIAGNOSIS — M19011 Primary osteoarthritis, right shoulder: Secondary | ICD-10-CM | POA: Diagnosis not present

## 2019-07-30 DIAGNOSIS — M19042 Primary osteoarthritis, left hand: Secondary | ICD-10-CM | POA: Diagnosis not present

## 2019-07-30 DIAGNOSIS — M19012 Primary osteoarthritis, left shoulder: Secondary | ICD-10-CM | POA: Diagnosis not present

## 2019-07-30 DIAGNOSIS — M47816 Spondylosis without myelopathy or radiculopathy, lumbar region: Secondary | ICD-10-CM | POA: Diagnosis not present

## 2019-07-30 NOTE — Telephone Encounter (Signed)
Margaret notified by telephone that Dr. Diona Browner has put in referral for social worker.

## 2019-07-30 NOTE — Telephone Encounter (Signed)
Pt due for next Prolia injection 09-16-19.  Pt has MCR and supp.  Pt would owe approximately $0.

## 2019-08-01 DIAGNOSIS — M47816 Spondylosis without myelopathy or radiculopathy, lumbar region: Secondary | ICD-10-CM | POA: Diagnosis not present

## 2019-08-01 DIAGNOSIS — M19012 Primary osteoarthritis, left shoulder: Secondary | ICD-10-CM | POA: Diagnosis not present

## 2019-08-01 DIAGNOSIS — I10 Essential (primary) hypertension: Secondary | ICD-10-CM | POA: Diagnosis not present

## 2019-08-01 DIAGNOSIS — E785 Hyperlipidemia, unspecified: Secondary | ICD-10-CM | POA: Diagnosis not present

## 2019-08-01 DIAGNOSIS — M85842 Other specified disorders of bone density and structure, left hand: Secondary | ICD-10-CM | POA: Diagnosis not present

## 2019-08-01 DIAGNOSIS — E119 Type 2 diabetes mellitus without complications: Secondary | ICD-10-CM | POA: Diagnosis not present

## 2019-08-01 DIAGNOSIS — M85841 Other specified disorders of bone density and structure, right hand: Secondary | ICD-10-CM | POA: Diagnosis not present

## 2019-08-01 DIAGNOSIS — Z9181 History of falling: Secondary | ICD-10-CM | POA: Diagnosis not present

## 2019-08-01 DIAGNOSIS — M19042 Primary osteoarthritis, left hand: Secondary | ICD-10-CM | POA: Diagnosis not present

## 2019-08-01 DIAGNOSIS — D1809 Hemangioma of other sites: Secondary | ICD-10-CM | POA: Diagnosis not present

## 2019-08-01 DIAGNOSIS — M5030 Other cervical disc degeneration, unspecified cervical region: Secondary | ICD-10-CM | POA: Diagnosis not present

## 2019-08-01 DIAGNOSIS — K219 Gastro-esophageal reflux disease without esophagitis: Secondary | ICD-10-CM | POA: Diagnosis not present

## 2019-08-01 DIAGNOSIS — Z87891 Personal history of nicotine dependence: Secondary | ICD-10-CM | POA: Diagnosis not present

## 2019-08-01 DIAGNOSIS — M109 Gout, unspecified: Secondary | ICD-10-CM | POA: Diagnosis not present

## 2019-08-01 DIAGNOSIS — M19041 Primary osteoarthritis, right hand: Secondary | ICD-10-CM | POA: Diagnosis not present

## 2019-08-01 DIAGNOSIS — M19011 Primary osteoarthritis, right shoulder: Secondary | ICD-10-CM | POA: Diagnosis not present

## 2019-08-01 DIAGNOSIS — M170 Bilateral primary osteoarthritis of knee: Secondary | ICD-10-CM | POA: Diagnosis not present

## 2019-08-01 DIAGNOSIS — G8929 Other chronic pain: Secondary | ICD-10-CM | POA: Diagnosis not present

## 2019-08-01 NOTE — Telephone Encounter (Signed)
Will f/u with pt at the end of the month to set up for lab apt and then injection.

## 2019-08-04 DIAGNOSIS — M19011 Primary osteoarthritis, right shoulder: Secondary | ICD-10-CM | POA: Diagnosis not present

## 2019-08-04 DIAGNOSIS — M19041 Primary osteoarthritis, right hand: Secondary | ICD-10-CM | POA: Diagnosis not present

## 2019-08-04 DIAGNOSIS — M19042 Primary osteoarthritis, left hand: Secondary | ICD-10-CM | POA: Diagnosis not present

## 2019-08-04 DIAGNOSIS — M47816 Spondylosis without myelopathy or radiculopathy, lumbar region: Secondary | ICD-10-CM | POA: Diagnosis not present

## 2019-08-04 DIAGNOSIS — M19012 Primary osteoarthritis, left shoulder: Secondary | ICD-10-CM | POA: Diagnosis not present

## 2019-08-04 DIAGNOSIS — M170 Bilateral primary osteoarthritis of knee: Secondary | ICD-10-CM | POA: Diagnosis not present

## 2019-08-05 DIAGNOSIS — M19011 Primary osteoarthritis, right shoulder: Secondary | ICD-10-CM | POA: Diagnosis not present

## 2019-08-05 DIAGNOSIS — M170 Bilateral primary osteoarthritis of knee: Secondary | ICD-10-CM | POA: Diagnosis not present

## 2019-08-05 DIAGNOSIS — M19012 Primary osteoarthritis, left shoulder: Secondary | ICD-10-CM | POA: Diagnosis not present

## 2019-08-05 DIAGNOSIS — M19041 Primary osteoarthritis, right hand: Secondary | ICD-10-CM | POA: Diagnosis not present

## 2019-08-05 DIAGNOSIS — M47816 Spondylosis without myelopathy or radiculopathy, lumbar region: Secondary | ICD-10-CM | POA: Diagnosis not present

## 2019-08-05 DIAGNOSIS — M19042 Primary osteoarthritis, left hand: Secondary | ICD-10-CM | POA: Diagnosis not present

## 2019-08-06 DIAGNOSIS — M19011 Primary osteoarthritis, right shoulder: Secondary | ICD-10-CM | POA: Diagnosis not present

## 2019-08-06 DIAGNOSIS — M170 Bilateral primary osteoarthritis of knee: Secondary | ICD-10-CM | POA: Diagnosis not present

## 2019-08-06 DIAGNOSIS — M19012 Primary osteoarthritis, left shoulder: Secondary | ICD-10-CM | POA: Diagnosis not present

## 2019-08-06 DIAGNOSIS — M19042 Primary osteoarthritis, left hand: Secondary | ICD-10-CM | POA: Diagnosis not present

## 2019-08-06 DIAGNOSIS — M19041 Primary osteoarthritis, right hand: Secondary | ICD-10-CM | POA: Diagnosis not present

## 2019-08-06 DIAGNOSIS — M47816 Spondylosis without myelopathy or radiculopathy, lumbar region: Secondary | ICD-10-CM | POA: Diagnosis not present

## 2019-08-11 DIAGNOSIS — M19042 Primary osteoarthritis, left hand: Secondary | ICD-10-CM | POA: Diagnosis not present

## 2019-08-11 DIAGNOSIS — M19011 Primary osteoarthritis, right shoulder: Secondary | ICD-10-CM | POA: Diagnosis not present

## 2019-08-11 DIAGNOSIS — M19041 Primary osteoarthritis, right hand: Secondary | ICD-10-CM | POA: Diagnosis not present

## 2019-08-11 DIAGNOSIS — M19012 Primary osteoarthritis, left shoulder: Secondary | ICD-10-CM | POA: Diagnosis not present

## 2019-08-11 DIAGNOSIS — M47816 Spondylosis without myelopathy or radiculopathy, lumbar region: Secondary | ICD-10-CM | POA: Diagnosis not present

## 2019-08-11 DIAGNOSIS — M170 Bilateral primary osteoarthritis of knee: Secondary | ICD-10-CM | POA: Diagnosis not present

## 2019-08-12 DIAGNOSIS — M19042 Primary osteoarthritis, left hand: Secondary | ICD-10-CM | POA: Diagnosis not present

## 2019-08-12 DIAGNOSIS — M47816 Spondylosis without myelopathy or radiculopathy, lumbar region: Secondary | ICD-10-CM | POA: Diagnosis not present

## 2019-08-12 DIAGNOSIS — M170 Bilateral primary osteoarthritis of knee: Secondary | ICD-10-CM | POA: Diagnosis not present

## 2019-08-12 DIAGNOSIS — M19011 Primary osteoarthritis, right shoulder: Secondary | ICD-10-CM | POA: Diagnosis not present

## 2019-08-12 DIAGNOSIS — M19041 Primary osteoarthritis, right hand: Secondary | ICD-10-CM | POA: Diagnosis not present

## 2019-08-12 DIAGNOSIS — M19012 Primary osteoarthritis, left shoulder: Secondary | ICD-10-CM | POA: Diagnosis not present

## 2019-08-18 ENCOUNTER — Telehealth: Payer: Self-pay

## 2019-08-18 DIAGNOSIS — M47816 Spondylosis without myelopathy or radiculopathy, lumbar region: Secondary | ICD-10-CM | POA: Diagnosis not present

## 2019-08-18 DIAGNOSIS — M170 Bilateral primary osteoarthritis of knee: Secondary | ICD-10-CM | POA: Diagnosis not present

## 2019-08-18 DIAGNOSIS — M19041 Primary osteoarthritis, right hand: Secondary | ICD-10-CM | POA: Diagnosis not present

## 2019-08-18 DIAGNOSIS — M19012 Primary osteoarthritis, left shoulder: Secondary | ICD-10-CM | POA: Diagnosis not present

## 2019-08-18 DIAGNOSIS — M19042 Primary osteoarthritis, left hand: Secondary | ICD-10-CM | POA: Diagnosis not present

## 2019-08-18 DIAGNOSIS — M19011 Primary osteoarthritis, right shoulder: Secondary | ICD-10-CM | POA: Diagnosis not present

## 2019-08-18 NOTE — Telephone Encounter (Signed)
Pt left v/m; pts daughter in law advised pt that she should not be taking a vitamin with fish oil in it. Pt said she has been taking vitamin with fish oil for years; is it OK for pt to continue taking vitamin with fish oil? Pt request cb.

## 2019-08-19 DIAGNOSIS — M19011 Primary osteoarthritis, right shoulder: Secondary | ICD-10-CM | POA: Diagnosis not present

## 2019-08-19 DIAGNOSIS — M19012 Primary osteoarthritis, left shoulder: Secondary | ICD-10-CM | POA: Diagnosis not present

## 2019-08-19 DIAGNOSIS — M47816 Spondylosis without myelopathy or radiculopathy, lumbar region: Secondary | ICD-10-CM | POA: Diagnosis not present

## 2019-08-19 DIAGNOSIS — M19042 Primary osteoarthritis, left hand: Secondary | ICD-10-CM | POA: Diagnosis not present

## 2019-08-19 DIAGNOSIS — M170 Bilateral primary osteoarthritis of knee: Secondary | ICD-10-CM | POA: Diagnosis not present

## 2019-08-19 DIAGNOSIS — M19041 Primary osteoarthritis, right hand: Secondary | ICD-10-CM | POA: Diagnosis not present

## 2019-08-19 NOTE — Telephone Encounter (Signed)
Ms. Handshoe notified as instructed by telephone.   °

## 2019-08-19 NOTE — Telephone Encounter (Signed)
Ok but not necessary to take vitamin with fish oil.

## 2019-08-21 DIAGNOSIS — M19042 Primary osteoarthritis, left hand: Secondary | ICD-10-CM | POA: Diagnosis not present

## 2019-08-21 DIAGNOSIS — M170 Bilateral primary osteoarthritis of knee: Secondary | ICD-10-CM | POA: Diagnosis not present

## 2019-08-21 DIAGNOSIS — M19011 Primary osteoarthritis, right shoulder: Secondary | ICD-10-CM | POA: Diagnosis not present

## 2019-08-21 DIAGNOSIS — M19012 Primary osteoarthritis, left shoulder: Secondary | ICD-10-CM | POA: Diagnosis not present

## 2019-08-21 DIAGNOSIS — M47816 Spondylosis without myelopathy or radiculopathy, lumbar region: Secondary | ICD-10-CM | POA: Diagnosis not present

## 2019-08-21 DIAGNOSIS — M19041 Primary osteoarthritis, right hand: Secondary | ICD-10-CM | POA: Diagnosis not present

## 2019-08-22 ENCOUNTER — Ambulatory Visit (INDEPENDENT_AMBULATORY_CARE_PROVIDER_SITE_OTHER): Payer: Medicare Other | Admitting: Family Medicine

## 2019-08-22 ENCOUNTER — Encounter: Payer: Self-pay | Admitting: Family Medicine

## 2019-08-22 ENCOUNTER — Other Ambulatory Visit: Payer: Self-pay

## 2019-08-22 VITALS — BP 120/62 | HR 88 | Temp 98.4°F | Ht 63.0 in | Wt 116.2 lb

## 2019-08-22 DIAGNOSIS — R351 Nocturia: Secondary | ICD-10-CM | POA: Insufficient documentation

## 2019-08-22 DIAGNOSIS — E119 Type 2 diabetes mellitus without complications: Secondary | ICD-10-CM

## 2019-08-22 DIAGNOSIS — I10 Essential (primary) hypertension: Secondary | ICD-10-CM | POA: Diagnosis not present

## 2019-08-22 DIAGNOSIS — J3089 Other allergic rhinitis: Secondary | ICD-10-CM

## 2019-08-22 LAB — POCT GLYCOSYLATED HEMOGLOBIN (HGB A1C): Hemoglobin A1C: 5.5 % (ref 4.0–5.6)

## 2019-08-22 MED ORDER — OXYBUTYNIN CHLORIDE 5 MG PO TABS: 5.0000 mg | ORAL_TABLET | Freq: Every day | ORAL | 3 refills | Status: DC

## 2019-08-22 NOTE — Assessment & Plan Note (Signed)
Well controlled. Continue current medication.  Ramipril may be causing chronic cough but she does not wish to change.

## 2019-08-22 NOTE — Assessment & Plan Note (Signed)
Stay on claritin long term. Allergies may be causing chronic cough.

## 2019-08-22 NOTE — Assessment & Plan Note (Signed)
Diet controlled.  

## 2019-08-22 NOTE — Assessment & Plan Note (Signed)
No naps.  Water earlier in day. Start oxybutynin at night only.  May need to try melatonin if not improving.

## 2019-08-22 NOTE — Progress Notes (Signed)
Chief Complaint  Patient presents with  . Diabetes    History of Present Illness: HPI    86 year old female presents for DM follow up  Diabetes:   Well controlled with diet. Lab Results  Component Value Date   HGBA1C 5.8 02/18/2019  Using medications without difficulties: Hypoglycemic episodes: Hyperglycemic episodes: Feet problems: no ulcer Blood Sugars averaging: not  checking eye exam within last year: 02/2019 Wt Readings from Last 3 Encounters:  08/22/19 116 lb 4 oz (52.7 kg)  07/23/19 113 lb (51.3 kg)  07/01/19 116 lb 12 oz (53 kg)   Hypertension:    BP Readings from Last 3 Encounters:  08/22/19 120/62  07/23/19 (!) 184/75  07/08/19 137/74  Using medication without problems or lightheadedness: none Chest pain with exertion:none Edema:none Short of breath:none Average home BPs: Other issues:   She is using the rolling walker.  No further falls after starting to use this.   She still has tenderness of left cheek where she had a hematoma after facial laceration.. No fracture seen on maxillofacial CT in 07/08/2019 following fall.   She has intermittant cough for years, tickle in throat,  On claritin. No sneeze.  occ post nasal drip. She is on ramipril Not bothering enough to try different BP med.  Using systane for dry eye.  This visit occurred during the SARS-CoV-2 public health emergency.  Safety protocols were in place, including screening questions prior to the visit, additional usage of staff PPE, and extensive cleaning of exam room while observing appropriate contact time as indicated for disinfecting solutions.   COVID 19 screen:  No recent travel or known exposure to COVID19 The patient denies respiratory symptoms of COVID 19 at this time. The importance of social distancing was discussed today.     Review of Systems  Constitutional: Positive for malaise/fatigue. Negative for chills and fever.  HENT: Negative for congestion and ear pain.   Eyes:  Negative for pain and redness.  Respiratory: Negative for cough and shortness of breath.   Cardiovascular: Negative for chest pain, palpitations and leg swelling.  Gastrointestinal: Negative for abdominal pain, blood in stool, constipation, diarrhea, nausea and vomiting.  Genitourinary: Negative for dysuria, flank pain, frequency, hematuria and urgency.       Waking up many times at night to urinate... this is contributing to her fatigue.  no dysuria, no daytime frequency.  she does nap during the day 2 hours.  she does have some incontinence of urine at night that soaks thorugh the pad.  Musculoskeletal: Negative for falls and myalgias.  Skin: Negative for rash.  Neurological: Negative for dizziness.  Psychiatric/Behavioral: Negative for depression. The patient is not nervous/anxious.       Past Medical History:  Diagnosis Date  . Acute pharyngitis   . Acute sinusitis, unspecified   . Acute upper respiratory infections of unspecified site   . Allergy   . Anal and rectal polyp   . Cataract    History of  . Cavernous angioma   . Degenerative cervical disc 02/2006  . Diabetes mellitus    Type II  . GERD (gastroesophageal reflux disease)   . Gout   . Hemangioma of unspecified site   . Hyperlipidemia   . Hypertension   . Orthostatic hypotension 04/2006   [facility]ospital  . Osteoarthritis   . Osteopenia   . Other abscess of vulva   . Palpitations   . Syncope and collapse    1 episode    reports that   she has quit smoking. She has never used smokeless tobacco. She reports current alcohol use. She reports that she does not use drugs.   Current Outpatient Medications:  .  ACCU-CHEK AVIVA PLUS test strip, USE TO CHECK BLOOD SUGAR DAILY AS NEEDED. DX: E11.9, Disp: 100 each, Rfl: 3 .  acetaminophen (TYLENOL) 325 MG tablet, Take 650 mg by mouth 2 (two) times daily. , Disp: , Rfl:  .  aspirin EC 81 MG tablet, Take 81 mg by mouth at bedtime. , Disp: , Rfl:  .  Blood Glucose Monitoring Suppl  (ACCU-CHEK GUIDE) w/Device KIT, 1 each by Does not apply route daily. Use to check blood sugar daily as needed.  Dx: E11.9, Disp: 1 kit, Rfl: 0 .  Fiber POWD, Take by mouth See admin instructions. Mix 1 rounded teaspoonful into 6-8 ounces of water and drink three times a day, Disp: , Rfl:  .  fish oil-omega-3 fatty acids 1000 MG capsule, Take 1,000 mg by mouth daily with breakfast. , Disp: , Rfl:  .  fluticasone (FLONASE) 50 MCG/ACT nasal spray, Place 2 sprays into both nostrils daily as needed for allergies., Disp: 16 g, Rfl: 3 .  Lancets (FREESTYLE) lancets, CHECK BLOOD SUGAR 1-2 TIMES A DAY. DX: E11.9 NON INSULIN DEPENDENT, Disp: 100 each, Rfl: 3 .  Multiple Vitamins-Minerals (ONE-A-DAY WOMENS 50+ ADVANTAGE) TABS, Take 1 tablet by mouth daily with breakfast., Disp: , Rfl:  .  omeprazole (PRILOSEC) 40 MG capsule, TAKE 1 CAPSULE BY MOUTH EVERY DAY, Disp: 90 capsule, Rfl: 1 .  polyethylene glycol (MIRALAX / GLYCOLAX) packet, Take 17 g by mouth daily as needed for mild constipation (MIX AND DRINK). , Disp: , Rfl:  .  Propylene Glycol (SYSTANE BALANCE) 0.6 % SOLN, Place 1-2 drops into both eyes daily., Disp: , Rfl:  .  ramipril (ALTACE) 5 MG capsule, TAKE 1 CAPSULE (5 MG TOTAL) BY MOUTH DAILY. (Patient taking differently: Take 5 mg by mouth daily. ), Disp: 90 capsule, Rfl: 1   Observations/Objective: Blood pressure 120/62, pulse 88, temperature 98.4 F (36.9 C), temperature source Temporal, height 5' 3" (1.6 m), weight 116 lb 4 oz (52.7 kg), SpO2 98 %.  Physical Exam Constitutional:      General: She is not in acute distress.    Appearance: Normal appearance. She is well-developed. She is not ill-appearing or toxic-appearing.  HENT:     Head: Normocephalic.     Right Ear: Hearing, tympanic membrane, ear canal and external ear normal. Tympanic membrane is not erythematous, retracted or bulging.     Left Ear: Hearing, tympanic membrane, ear canal and external ear normal. Tympanic membrane is not  erythematous, retracted or bulging.     Nose: No mucosal edema or rhinorrhea.     Right Sinus: No maxillary sinus tenderness or frontal sinus tenderness.     Left Sinus: No maxillary sinus tenderness or frontal sinus tenderness.     Mouth/Throat:     Pharynx: Uvula midline.  Eyes:     General: Lids are normal. Lids are everted, no foreign bodies appreciated.     Conjunctiva/sclera: Conjunctivae normal.     Pupils: Pupils are equal, round, and reactive to light.  Neck:     Thyroid: No thyroid mass or thyromegaly.     Vascular: No carotid bruit.     Trachea: Trachea normal.  Cardiovascular:     Rate and Rhythm: Normal rate and regular rhythm.     Pulses: Normal pulses.     Heart sounds: Normal  heart sounds, S1 normal and S2 normal. No murmur. No friction rub. No gallop.   Pulmonary:     Effort: Pulmonary effort is normal. No tachypnea or respiratory distress.     Breath sounds: Normal breath sounds. No decreased breath sounds, wheezing, rhonchi or rales.  Abdominal:     General: Bowel sounds are normal.     Palpations: Abdomen is soft.     Tenderness: There is no abdominal tenderness.  Musculoskeletal:     Cervical back: Normal range of motion and neck supple.     Comments:  Severe deformity from OA in bilateral hands PIP and DIP joints.. limiting grip and function  Bilateral knee deformity and mild swelling, decreased ROM bilateral knees Decreased ROM bialterl shoulders  Skin:    General: Skin is warm and dry.     Findings: No rash.  Neurological:     Mental Status: She is alert and oriented to person, place, and time.     Cranial Nerves: Cranial nerves are intact.     Sensory: Sensation is intact.     Motor: Motor function is intact.     Gait: Gait abnormal.     Comments:  Slow unsteady gait  Psychiatric:        Mood and Affect: Mood is not anxious or depressed.        Speech: Speech normal.        Behavior: Behavior normal. Behavior is cooperative.        Thought Content:  Thought content normal.        Judgment: Judgment normal.      Assessment and Plan Essential hypertension, benign Well controlled. Continue current medication.  Ramipril may be causing chronic cough but she does not wish to change.  Allergic rhinitis  Stay on claritin long term. Allergies may be causing chronic cough.  Controlled type 2 diabetes mellitus without complication, without long-term current use of insulin (HCC) Diet controlled.  Nocturia more than twice per night No naps.  Water earlier in day. Start oxybutynin at night only.  May need to try melatonin if not improving.       Eliezer Lofts, MD

## 2019-08-25 ENCOUNTER — Telehealth: Payer: Self-pay

## 2019-08-25 DIAGNOSIS — M170 Bilateral primary osteoarthritis of knee: Secondary | ICD-10-CM | POA: Diagnosis not present

## 2019-08-25 DIAGNOSIS — M47816 Spondylosis without myelopathy or radiculopathy, lumbar region: Secondary | ICD-10-CM | POA: Diagnosis not present

## 2019-08-25 DIAGNOSIS — M19012 Primary osteoarthritis, left shoulder: Secondary | ICD-10-CM | POA: Diagnosis not present

## 2019-08-25 DIAGNOSIS — M19041 Primary osteoarthritis, right hand: Secondary | ICD-10-CM | POA: Diagnosis not present

## 2019-08-25 DIAGNOSIS — M19011 Primary osteoarthritis, right shoulder: Secondary | ICD-10-CM | POA: Diagnosis not present

## 2019-08-25 DIAGNOSIS — M19042 Primary osteoarthritis, left hand: Secondary | ICD-10-CM | POA: Diagnosis not present

## 2019-08-25 NOTE — Telephone Encounter (Signed)
Pt left v/m that she had visit with Dr Diona Browner on 08/22/19 and pt just got the oxybutynin 5 mg taking one daily delivered today and pt understood from information that came with medicine that pt should call if side effects. Pt wants to know if she should call before taking med or wait until takes med to see if having side effects. Unable to reach pt by phone and phone rang numerous times with no v/m. Tried to call pt x 2.

## 2019-08-25 NOTE — Telephone Encounter (Signed)
I spoke with pt; pt was not sure note from pharmacy meant that pt should call your doctor for medical advice about side effects or before starting new med. Pt spoke with pharmacist since pt left v/m at [facility] and was advised to contact Dr Diona Browner if pt had any side effects or problems after taking the oxybutynin. Pt voiced understanding to pharmacy and will start oxybutynin tonight and pt will call Dr Diona Browner if needed. Nothing further needed at this time.

## 2019-08-26 NOTE — Telephone Encounter (Signed)
Tried to reach pt but line is busy.

## 2019-08-27 ENCOUNTER — Telehealth: Payer: Self-pay

## 2019-08-27 DIAGNOSIS — M47816 Spondylosis without myelopathy or radiculopathy, lumbar region: Secondary | ICD-10-CM | POA: Diagnosis not present

## 2019-08-27 DIAGNOSIS — M19011 Primary osteoarthritis, right shoulder: Secondary | ICD-10-CM | POA: Diagnosis not present

## 2019-08-27 DIAGNOSIS — M19012 Primary osteoarthritis, left shoulder: Secondary | ICD-10-CM | POA: Diagnosis not present

## 2019-08-27 DIAGNOSIS — M19041 Primary osteoarthritis, right hand: Secondary | ICD-10-CM | POA: Diagnosis not present

## 2019-08-27 DIAGNOSIS — M170 Bilateral primary osteoarthritis of knee: Secondary | ICD-10-CM | POA: Diagnosis not present

## 2019-08-27 DIAGNOSIS — M19042 Primary osteoarthritis, left hand: Secondary | ICD-10-CM | POA: Diagnosis not present

## 2019-08-27 NOTE — Telephone Encounter (Signed)
Left message for Tharon Aquas giving verbal orders to extend pts HH PT 1 x a wk for 4 wks and then one x every other week for 4 wks.

## 2019-08-27 NOTE — Telephone Encounter (Signed)
[name] PT with [facility] requesting verbal orders to extend pts HH PT; pt is doing well and requesting v/o HH PT 1 x a wk for 4 wks and then one x every other week for 4 wks.

## 2019-08-29 ENCOUNTER — Telehealth: Payer: Self-pay | Admitting: Family Medicine

## 2019-08-29 NOTE — Telephone Encounter (Signed)
Call  Has she had any improvement with urination at night.. decrease in frequency, resting better? If significant improvement in sleep and nocturia.. continue.. have her check BP. If still not improving or worsening in 1-2 more weeks  and not enough improvement to make continued use worthwhile then stop med.  If not improvement  or very bothered by symptoms stop med now. The swelling could be from the med, but the numbness is not unless fingers are swollen too.

## 2019-08-29 NOTE — Telephone Encounter (Signed)
Patient called She stated that she has been taking the  oxybutynin (DITROPAN) 5 MG tablet  She stated she has read that if she has any problems/side effects to call the office. Patient stated that she has noticed that her legs are slightly swollen. Patient said it is not bad just puffy. She also stated that her fingers are feeling numb.   Patient would like to know what you suggest she do.   Please advise

## 2019-08-29 NOTE — Telephone Encounter (Signed)
Spoke with Ms. [name].  She states that she has only had 2 doses of the oxybutynin but she did rest better the two nights that she took it.  She states she is just having a little swelling in her left ankle but denies swelling in her fingers.  She is agreeable to continuing the medication and will keep a check on her blood pressure.  She will call back if symptoms worsen.

## 2019-08-31 DIAGNOSIS — M19041 Primary osteoarthritis, right hand: Secondary | ICD-10-CM | POA: Diagnosis not present

## 2019-08-31 DIAGNOSIS — E785 Hyperlipidemia, unspecified: Secondary | ICD-10-CM | POA: Diagnosis not present

## 2019-08-31 DIAGNOSIS — E119 Type 2 diabetes mellitus without complications: Secondary | ICD-10-CM | POA: Diagnosis not present

## 2019-08-31 DIAGNOSIS — D1809 Hemangioma of other sites: Secondary | ICD-10-CM | POA: Diagnosis not present

## 2019-08-31 DIAGNOSIS — K219 Gastro-esophageal reflux disease without esophagitis: Secondary | ICD-10-CM | POA: Diagnosis not present

## 2019-08-31 DIAGNOSIS — M5030 Other cervical disc degeneration, unspecified cervical region: Secondary | ICD-10-CM | POA: Diagnosis not present

## 2019-08-31 DIAGNOSIS — G8929 Other chronic pain: Secondary | ICD-10-CM | POA: Diagnosis not present

## 2019-08-31 DIAGNOSIS — M19011 Primary osteoarthritis, right shoulder: Secondary | ICD-10-CM | POA: Diagnosis not present

## 2019-08-31 DIAGNOSIS — I10 Essential (primary) hypertension: Secondary | ICD-10-CM | POA: Diagnosis not present

## 2019-08-31 DIAGNOSIS — M109 Gout, unspecified: Secondary | ICD-10-CM | POA: Diagnosis not present

## 2019-08-31 DIAGNOSIS — M19042 Primary osteoarthritis, left hand: Secondary | ICD-10-CM | POA: Diagnosis not present

## 2019-08-31 DIAGNOSIS — Z87891 Personal history of nicotine dependence: Secondary | ICD-10-CM | POA: Diagnosis not present

## 2019-08-31 DIAGNOSIS — M170 Bilateral primary osteoarthritis of knee: Secondary | ICD-10-CM | POA: Diagnosis not present

## 2019-08-31 DIAGNOSIS — M19012 Primary osteoarthritis, left shoulder: Secondary | ICD-10-CM | POA: Diagnosis not present

## 2019-08-31 DIAGNOSIS — M85842 Other specified disorders of bone density and structure, left hand: Secondary | ICD-10-CM | POA: Diagnosis not present

## 2019-08-31 DIAGNOSIS — M47816 Spondylosis without myelopathy or radiculopathy, lumbar region: Secondary | ICD-10-CM | POA: Diagnosis not present

## 2019-08-31 DIAGNOSIS — M85841 Other specified disorders of bone density and structure, right hand: Secondary | ICD-10-CM | POA: Diagnosis not present

## 2019-08-31 DIAGNOSIS — Z9181 History of falling: Secondary | ICD-10-CM | POA: Diagnosis not present

## 2019-09-03 DIAGNOSIS — M170 Bilateral primary osteoarthritis of knee: Secondary | ICD-10-CM | POA: Diagnosis not present

## 2019-09-03 DIAGNOSIS — M5030 Other cervical disc degeneration, unspecified cervical region: Secondary | ICD-10-CM | POA: Diagnosis not present

## 2019-09-03 DIAGNOSIS — M19041 Primary osteoarthritis, right hand: Secondary | ICD-10-CM | POA: Diagnosis not present

## 2019-09-03 DIAGNOSIS — M19042 Primary osteoarthritis, left hand: Secondary | ICD-10-CM | POA: Diagnosis not present

## 2019-09-03 DIAGNOSIS — M19011 Primary osteoarthritis, right shoulder: Secondary | ICD-10-CM | POA: Diagnosis not present

## 2019-09-03 DIAGNOSIS — M47816 Spondylosis without myelopathy or radiculopathy, lumbar region: Secondary | ICD-10-CM | POA: Diagnosis not present

## 2019-09-08 ENCOUNTER — Telehealth: Payer: Self-pay | Admitting: Family Medicine

## 2019-09-08 NOTE — Telephone Encounter (Signed)
Patient called today She stated that earlier today she received a call from [place].  She stated that they tried to reach out to her daughter also  Please advise

## 2019-09-08 NOTE — Telephone Encounter (Signed)
Contacted pt and advised that this nurse called this morning regarding she is due for her prolia injection at the end of the month. Pt reports a lot went on today and this is not something she wants to do right now.  Inquired concerning Dr. Diona Browner questions.  Pt reports she did not miss any meals but did only eat cereal with berries this morning. Pt is feeling fine now but reports her eyes are heavy. She could not elaborate on that symptom, just that "they are heavy."  Pt reports she thinks the oxybutynin is helping because she has only been getting up about 4 times a night. Pt is also taking an antihistamine but could not find the name of it. She said it was over the counter. Talked to pt's daughter, [name], who was with the pt, pt gave ok to talk to [place]. Advised pt wanted to wait on prolia so this nurse would f/u in about 1 month. Educated daughter on hypoglycemia and things to have ready if needed for hypoglycemia.  She also could not think of the name of the antihistamine but asked if the pt should take AM or PM. Advised most of the time antihistamines are taken PM because they can cause some drowsiness but the directions on the package should be followed. Advised if there were any changes to contact the office. [name] verbalized understanding.

## 2019-09-08 NOTE — Telephone Encounter (Signed)
She is  not on any med to drop sugar. Make sure not skipping meals.  Does she think the new oxybutynin is helping or making her oversedated feeling? If so.. stop.

## 2019-09-08 NOTE — Telephone Encounter (Signed)
Returned [name]'s call. I advised [name] that I had not called her or [name] today.  While on the phone [name] stated that she almost passed out today around 12:15 pm. She states this lasted for about 15 to 20 minutes.  Her daughter [name])  called 911 and when they came out her BS was 60 mg/dl.  BP 130/72 and pulse was 71.  They had her drink some OJ and milk.  She ate some peanut butter and her blood sugar came back up immediately but she could not recall the BS reading when they checked her the 2nd time.  She states she had also been taking a generic antihistamine and wasn't sure if this was the cause.  She declined going to the ER since her blood sugar came back up.  I advised her that she probably felt bad because her blood sugar was so low but I would send a message to Dr. Diona Browner for recommendation or to see if patient needs to be seen here at our office. Please advise.

## 2019-09-10 DIAGNOSIS — M19041 Primary osteoarthritis, right hand: Secondary | ICD-10-CM | POA: Diagnosis not present

## 2019-09-10 DIAGNOSIS — M19011 Primary osteoarthritis, right shoulder: Secondary | ICD-10-CM | POA: Diagnosis not present

## 2019-09-10 DIAGNOSIS — M19042 Primary osteoarthritis, left hand: Secondary | ICD-10-CM | POA: Diagnosis not present

## 2019-09-10 DIAGNOSIS — M5030 Other cervical disc degeneration, unspecified cervical region: Secondary | ICD-10-CM | POA: Diagnosis not present

## 2019-09-10 DIAGNOSIS — M170 Bilateral primary osteoarthritis of knee: Secondary | ICD-10-CM | POA: Diagnosis not present

## 2019-09-10 DIAGNOSIS — M47816 Spondylosis without myelopathy or radiculopathy, lumbar region: Secondary | ICD-10-CM | POA: Diagnosis not present

## 2019-09-12 ENCOUNTER — Other Ambulatory Visit: Payer: Self-pay

## 2019-09-12 ENCOUNTER — Ambulatory Visit (INDEPENDENT_AMBULATORY_CARE_PROVIDER_SITE_OTHER)
Admission: RE | Admit: 2019-09-12 | Discharge: 2019-09-12 | Disposition: A | Discharge status: Home / Self Care | Payer: Medicare Other | Source: Ambulatory Visit | Attending: Family Medicine | Admitting: Family Medicine

## 2019-09-12 ENCOUNTER — Encounter: Payer: Self-pay | Admitting: Family Medicine

## 2019-09-12 ENCOUNTER — Ambulatory Visit (INDEPENDENT_AMBULATORY_CARE_PROVIDER_SITE_OTHER): Payer: Medicare Other | Admitting: Family Medicine

## 2019-09-12 VITALS — BP 100/60 | HR 75 | Temp 98.3°F | Ht 63.0 in | Wt 116.5 lb

## 2019-09-12 DIAGNOSIS — R5383 Other fatigue: Secondary | ICD-10-CM | POA: Diagnosis not present

## 2019-09-12 DIAGNOSIS — R053 Chronic cough: Secondary | ICD-10-CM

## 2019-09-12 DIAGNOSIS — H5713 Ocular pain, bilateral: Secondary | ICD-10-CM | POA: Diagnosis not present

## 2019-09-12 DIAGNOSIS — R05 Cough: Secondary | ICD-10-CM

## 2019-09-12 NOTE — Progress Notes (Signed)
Chief Complaint  Patient presents with  . Eye Pain  . Fatigue    "Just doesn't feel like she can make it"    History of Present Illness: HPI   86 year old female patient with history of TIA, HTN, DM and allergic rhinitis presents with  intermittent chills in last 2-3 days.  She is more tired. She as been more tired lately.  Eyes feeling heavy for months. She feels her vision is worse in last several months off and on. No watery, no itching in eyes.   Today she has felt " bad", she has hard time describe. She has felt nauseous off and on. No fever.  He blood sugars has been low.. today 126. No new pain, no SOB.   She has been having dry cough,  No sure if post nasal drip but does loose up mucus in the morning. . Seems worse in morning.   Has been going on > 2 months.    This visit occurred during the SARS-CoV-2 public health emergency.  Safety protocols were in place, including screening questions prior to the visit, additional usage of staff PPE, and extensive cleaning of exam room while observing appropriate contact time as indicated for disinfecting solutions.   COVID 19 screen:  No recent travel or known exposure to COVID19 The patient denies respiratory symptoms of COVID 19 at this time. The importance of social distancing was discussed today.     Review of Systems  Constitutional: Positive for malaise/fatigue. Negative for chills and fever.  HENT: Negative for congestion and ear pain.   Eyes: Positive for pain. Negative for redness.  Respiratory: Negative for cough and shortness of breath.   Cardiovascular: Negative for chest pain, palpitations and leg swelling.  Gastrointestinal: Negative for abdominal pain, blood in stool, constipation, diarrhea, nausea and vomiting.  Genitourinary: Negative for dysuria.  Musculoskeletal: Negative for falls and myalgias.  Skin: Negative for rash.  Neurological: Negative for dizziness.  Psychiatric/Behavioral: Negative for  depression. The patient is not nervous/anxious.       Past Medical History:  Diagnosis Date  . Acute pharyngitis   . Acute sinusitis, unspecified   . Acute upper respiratory infections of unspecified site   . Allergy   . Anal and rectal polyp   . Cataract    History of  . Cavernous angioma   . Degenerative cervical disc 02/2006  . Diabetes mellitus    Type II  . GERD (gastroesophageal reflux disease)   . Gout   . Hemangioma of unspecified site   . Hyperlipidemia   . Hypertension   . Orthostatic hypotension 04/2006   Hospital  . Osteoarthritis   . Osteopenia   . Other abscess of vulva   . Palpitations   . Syncope and collapse    1 episode    reports that she has quit smoking. She has never used smokeless tobacco. She reports current alcohol use. She reports that she does not use drugs.   Current Outpatient Medications:  .  ACCU-CHEK AVIVA PLUS test strip, USE TO CHECK BLOOD SUGAR DAILY AS NEEDED. DX: E11.9, Disp: 100 each, Rfl: 3 .  acetaminophen (TYLENOL) 325 MG tablet, Take 650 mg by mouth 2 (two) times daily. , Disp: , Rfl:  .  aspirin EC 81 MG tablet, Take 81 mg by mouth at bedtime. , Disp: , Rfl:  .  Blood Glucose Monitoring Suppl (ACCU-CHEK GUIDE) w/Device KIT, 1 each by Does not apply route daily. Use to check blood sugar  daily as needed.  Dx: E11.9, Disp: 1 kit, Rfl: 0 .  cetirizine (ZYRTEC) 10 MG tablet, Take 10 mg by mouth daily., Disp: , Rfl:  .  Fiber POWD, Take by mouth See admin instructions. Mix 1 rounded teaspoonful into 6-8 ounces of water and drink three times a day, Disp: , Rfl:  .  fish oil-omega-3 fatty acids 1000 MG capsule, Take 1,000 mg by mouth daily with breakfast. , Disp: , Rfl:  .  fluticasone (FLONASE) 50 MCG/ACT nasal spray, Place 2 sprays into both nostrils daily as needed for allergies., Disp: 16 g, Rfl: 3 .  Lancets (FREESTYLE) lancets, CHECK BLOOD SUGAR 1-2 TIMES A DAY. DX: E11.9 NON INSULIN DEPENDENT, Disp: 100 each, Rfl: 3 .  Multiple  Vitamins-Minerals (ONE-A-DAY WOMENS 50+ ADVANTAGE) TABS, Take 1 tablet by mouth daily with breakfast., Disp: , Rfl:  .  omeprazole (PRILOSEC) 40 MG capsule, TAKE 1 CAPSULE BY MOUTH EVERY DAY, Disp: 90 capsule, Rfl: 1 .  oxybutynin (DITROPAN) 5 MG tablet, Take 1 tablet (5 mg total) by mouth at bedtime., Disp: 30 tablet, Rfl: 3 .  polyethylene glycol (MIRALAX / GLYCOLAX) packet, Take 17 g by mouth daily as needed for mild constipation (MIX AND DRINK). , Disp: , Rfl:  .  Propylene Glycol (SYSTANE BALANCE) 0.6 % SOLN, Place 1-2 drops into both eyes daily., Disp: , Rfl:  .  ramipril (ALTACE) 5 MG capsule, TAKE 1 CAPSULE (5 MG TOTAL) BY MOUTH DAILY., Disp: 90 capsule, Rfl: 1   Observations/Objective: Temperature 98.3 F (36.8 C), temperature source Temporal, height 5' 3"  (1.6 m), weight 116 lb 8 oz (52.8 kg).  BP Readings from Last 3 Encounters:  09/12/19 100/60  08/22/19 120/62  07/23/19 (!) 184/75    Physical Exam Constitutional:      General: She is not in acute distress.    Appearance: Normal appearance. She is well-developed. She is not ill-appearing or toxic-appearing.  HENT:     Head: Normocephalic.     Right Ear: Hearing, tympanic membrane, ear canal and external ear normal. Tympanic membrane is not erythematous, retracted or bulging.     Left Ear: Hearing, tympanic membrane, ear canal and external ear normal. Tympanic membrane is not erythematous, retracted or bulging.     Nose: No mucosal edema or rhinorrhea.     Right Sinus: No maxillary sinus tenderness or frontal sinus tenderness.     Left Sinus: No maxillary sinus tenderness or frontal sinus tenderness.     Mouth/Throat:     Pharynx: Uvula midline.  Eyes:     General: Lids are normal. Lids are everted, no foreign bodies appreciated.     Conjunctiva/sclera: Conjunctivae normal.     Pupils: Pupils are equal, round, and reactive to light.  Neck:     Thyroid: No thyroid mass or thyromegaly.     Vascular: No carotid bruit.      Trachea: Trachea normal.  Cardiovascular:     Rate and Rhythm: Normal rate and regular rhythm.     Pulses: Normal pulses.     Heart sounds: Normal heart sounds, S1 normal and S2 normal. No murmur heard.  No friction rub. No gallop.   Pulmonary:     Effort: Pulmonary effort is normal. No tachypnea or respiratory distress.     Breath sounds: Normal breath sounds. No decreased breath sounds, wheezing, rhonchi or rales.  Abdominal:     General: Bowel sounds are normal.     Palpations: Abdomen is soft.     Tenderness:  There is no abdominal tenderness.  Musculoskeletal:     Cervical back: Normal range of motion and neck supple.  Skin:    General: Skin is warm and dry.     Findings: No rash.  Neurological:     Mental Status: She is alert.  Psychiatric:        Mood and Affect: Mood is not anxious or depressed.        Speech: Speech normal.        Behavior: Behavior normal. Behavior is cooperative.        Thought Content: Thought content normal.        Judgment: Judgment normal.      Assessment and Plan   Cough, persistent Eval with CXR.  Possibly due to allergies.  Eye pain Have eye eval a planned by ophtamology. Possibly due to allergies.   Fatigue Likely due to medication SE. Stop oxybutynin.  Stop cetrizine and change to Claritin (loratadine)      Eliezer Lofts, MD

## 2019-09-12 NOTE — Patient Instructions (Addendum)
Stop oxybutynin.  Stop cetrizine and change to Claritin (loratadine)  Make sure to have 3 meals and 2 snacks daily.  Consider hypoallergenic pillow and pillowcase.  We will call with CXR results.  Have eye exam.

## 2019-09-17 DIAGNOSIS — M170 Bilateral primary osteoarthritis of knee: Secondary | ICD-10-CM | POA: Diagnosis not present

## 2019-09-17 DIAGNOSIS — M19011 Primary osteoarthritis, right shoulder: Secondary | ICD-10-CM | POA: Diagnosis not present

## 2019-09-17 DIAGNOSIS — M5030 Other cervical disc degeneration, unspecified cervical region: Secondary | ICD-10-CM | POA: Diagnosis not present

## 2019-09-17 DIAGNOSIS — M47816 Spondylosis without myelopathy or radiculopathy, lumbar region: Secondary | ICD-10-CM | POA: Diagnosis not present

## 2019-09-17 DIAGNOSIS — M19042 Primary osteoarthritis, left hand: Secondary | ICD-10-CM | POA: Diagnosis not present

## 2019-09-17 DIAGNOSIS — M19041 Primary osteoarthritis, right hand: Secondary | ICD-10-CM | POA: Diagnosis not present

## 2019-09-18 ENCOUNTER — Telehealth: Payer: Self-pay | Admitting: Family Medicine

## 2019-09-18 NOTE — Telephone Encounter (Signed)
Left message for [name] that her CXR showed no active cardiopulmonary disease.

## 2019-09-18 NOTE — Telephone Encounter (Signed)
Patient called about her xray results. She would like to know if those results are back yet

## 2019-09-24 DIAGNOSIS — M170 Bilateral primary osteoarthritis of knee: Secondary | ICD-10-CM | POA: Diagnosis not present

## 2019-09-24 DIAGNOSIS — M19042 Primary osteoarthritis, left hand: Secondary | ICD-10-CM | POA: Diagnosis not present

## 2019-09-24 DIAGNOSIS — M19041 Primary osteoarthritis, right hand: Secondary | ICD-10-CM | POA: Diagnosis not present

## 2019-09-24 DIAGNOSIS — M47816 Spondylosis without myelopathy or radiculopathy, lumbar region: Secondary | ICD-10-CM | POA: Diagnosis not present

## 2019-09-24 DIAGNOSIS — M5030 Other cervical disc degeneration, unspecified cervical region: Secondary | ICD-10-CM | POA: Diagnosis not present

## 2019-09-24 DIAGNOSIS — M19011 Primary osteoarthritis, right shoulder: Secondary | ICD-10-CM | POA: Diagnosis not present

## 2019-09-26 ENCOUNTER — Ambulatory Visit (INDEPENDENT_AMBULATORY_CARE_PROVIDER_SITE_OTHER): Payer: Medicare Other | Admitting: Family Medicine

## 2019-09-26 ENCOUNTER — Encounter: Payer: Self-pay | Admitting: Family Medicine

## 2019-09-26 ENCOUNTER — Telehealth: Payer: Self-pay | Admitting: Family Medicine

## 2019-09-26 ENCOUNTER — Other Ambulatory Visit: Payer: Self-pay

## 2019-09-26 VITALS — BP 120/72 | HR 87 | Temp 97.9°F | Ht 63.0 in | Wt 115.5 lb

## 2019-09-26 DIAGNOSIS — L219 Seborrheic dermatitis, unspecified: Secondary | ICD-10-CM | POA: Insufficient documentation

## 2019-09-26 DIAGNOSIS — M542 Cervicalgia: Secondary | ICD-10-CM

## 2019-09-26 MED ORDER — KETOCONAZOLE 2 % EX SHAM: 1.0000 "application " | MEDICATED_SHAMPOO | CUTANEOUS | 2 refills | Status: DC

## 2019-09-26 NOTE — Telephone Encounter (Signed)
Ms. Lender notified as instructed by telephone.  Patient states understanding. 

## 2019-09-26 NOTE — Assessment & Plan Note (Signed)
Will treat with ketoconazole shampoo. She will call /email for me to identify what tube of cream  From Derm to use on lesions on face.

## 2019-09-26 NOTE — Telephone Encounter (Signed)
Patient daughter called back to let you know the names of the 2 creams the patient has used for her face    Ketoconazole cream     Triamcinolone acetonide 5%

## 2019-09-26 NOTE — Patient Instructions (Addendum)
Can use tylenol for pain.  Continue range of motion exercise. Can use heat on neck.

## 2019-09-26 NOTE — Assessment & Plan Note (Signed)
Now resolved. Likely muscle spasm vs OA.  If recurs use tylenol, massage, gentle ROM and heat.

## 2019-09-26 NOTE — Progress Notes (Signed)
Chief Complaint  Patient presents with  . Neck Pain    x 6 days ago.  PT worked with her and it feels better today    History of Present Illness: HPI    86 year old female presents with new onset pain in neck, started 7 days ago.  Woke up  With it. Pain with moving neck. TTP in trapezius on right.  Decreased ROM.  no weakness, no numbness.  no CP, NO SOB.   She had PT work with her and now it has improved. Started home stretching   Took tylenol.   She denies falls, change in activity.   Has seborrheic dermatitis of scalp.. requesting refill of medication to treat given by dermatologist. Not sure what it was.    This visit occurred during the SARS-CoV-2 public health emergency.  Safety protocols were in place, including screening questions prior to the visit, additional usage of staff PPE, and extensive cleaning of exam room while observing appropriate contact time as indicated for disinfecting solutions.   COVID 19 screen:  No recent travel or known exposure to COVID19 The patient denies respiratory symptoms of COVID 19 at this time. The importance of social distancing was discussed today.     Review of Systems  Constitutional: Negative for chills and fever.  HENT: Negative for congestion and ear pain.   Eyes: Negative for pain and redness.  Respiratory: Negative for cough and shortness of breath.   Cardiovascular: Negative for chest pain, palpitations and leg swelling.  Gastrointestinal: Negative for abdominal pain, blood in stool, constipation, diarrhea, nausea and vomiting.  Genitourinary: Negative for dysuria.  Musculoskeletal: Negative for falls and myalgias.  Skin: Negative for rash.  Neurological: Negative for dizziness.  Psychiatric/Behavioral: Negative for depression. The patient is not nervous/anxious.       Past Medical History:  Diagnosis Date  . Acute pharyngitis   . Acute sinusitis, unspecified   . Acute upper respiratory infections of unspecified  site   . Allergy   . Anal and rectal polyp   . Cataract    History of  . Cavernous angioma   . Degenerative cervical disc 02/2006  . Diabetes mellitus    Type II  . GERD (gastroesophageal reflux disease)   . Gout   . Hemangioma of unspecified site   . Hyperlipidemia   . Hypertension   . Orthostatic hypotension 04/2006   Hospital  . Osteoarthritis   . Osteopenia   . Other abscess of vulva   . Palpitations   . Syncope and collapse    1 episode    reports that she has quit smoking. She has never used smokeless tobacco. She reports current alcohol use. She reports that she does not use drugs.   Current Outpatient Medications:  .  ACCU-CHEK AVIVA PLUS test strip, USE TO CHECK BLOOD SUGAR DAILY AS NEEDED. DX: E11.9, Disp: 100 each, Rfl: 3 .  acetaminophen (TYLENOL) 325 MG tablet, Take 650 mg by mouth 2 (two) times daily. , Disp: , Rfl:  .  aspirin EC 81 MG tablet, Take 81 mg by mouth at bedtime. , Disp: , Rfl:  .  Blood Glucose Monitoring Suppl (ACCU-CHEK GUIDE) w/Device KIT, 1 each by Does not apply route daily. Use to check blood sugar daily as needed.  Dx: E11.9, Disp: 1 kit, Rfl: 0 .  cetirizine (ZYRTEC) 10 MG tablet, Take 10 mg by mouth daily., Disp: , Rfl:  .  Fiber POWD, Take by mouth See admin instructions. Mix 1  rounded teaspoonful into 6-8 ounces of water and drink three times a day, Disp: , Rfl:  .  fish oil-omega-3 fatty acids 1000 MG capsule, Take 1,000 mg by mouth daily with breakfast. , Disp: , Rfl:  .  fluticasone (FLONASE) 50 MCG/ACT nasal spray, Place 2 sprays into both nostrils daily as needed for allergies., Disp: 16 g, Rfl: 3 .  Lancets (FREESTYLE) lancets, CHECK BLOOD SUGAR 1-2 TIMES A DAY. DX: E11.9 NON INSULIN DEPENDENT, Disp: 100 each, Rfl: 3 .  Multiple Vitamins-Minerals (ONE-A-DAY WOMENS 50+ ADVANTAGE) TABS, Take 1 tablet by mouth daily with breakfast., Disp: , Rfl:  .  omeprazole (PRILOSEC) 40 MG capsule, TAKE 1 CAPSULE BY MOUTH EVERY DAY, Disp: 90 capsule,  Rfl: 1 .  oxybutynin (DITROPAN) 5 MG tablet, Take 1 tablet (5 mg total) by mouth at bedtime., Disp: 30 tablet, Rfl: 3 .  polyethylene glycol (MIRALAX / GLYCOLAX) packet, Take 17 g by mouth daily as needed for mild constipation (MIX AND DRINK). , Disp: , Rfl:  .  Propylene Glycol (SYSTANE BALANCE) 0.6 % SOLN, Place 1-2 drops into both eyes daily., Disp: , Rfl:  .  ramipril (ALTACE) 5 MG capsule, TAKE 1 CAPSULE (5 MG TOTAL) BY MOUTH DAILY., Disp: 90 capsule, Rfl: 1   Observations/Objective: Blood pressure 120/72, pulse 87, temperature 97.9 F (36.6 C), temperature source Temporal, height 5' 3"  (1.6 m), weight 115 lb 8 oz (52.4 kg), SpO2 96 %.  Physical Exam Constitutional:      General: She is not in acute distress.    Appearance: Normal appearance. She is well-developed. She is not ill-appearing or toxic-appearing.  HENT:     Head: Normocephalic.     Right Ear: Hearing, tympanic membrane, ear canal and external ear normal. Tympanic membrane is not erythematous, retracted or bulging.     Left Ear: Hearing, tympanic membrane, ear canal and external ear normal. Tympanic membrane is not erythematous, retracted or bulging.     Nose: No mucosal edema or rhinorrhea.     Right Sinus: No maxillary sinus tenderness or frontal sinus tenderness.     Left Sinus: No maxillary sinus tenderness or frontal sinus tenderness.     Mouth/Throat:     Pharynx: Uvula midline.  Eyes:     General: Lids are normal. Lids are everted, no foreign bodies appreciated.     Conjunctiva/sclera: Conjunctivae normal.     Pupils: Pupils are equal, round, and reactive to light.  Neck:     Thyroid: No thyroid mass or thyromegaly.     Vascular: No carotid bruit.     Trachea: Trachea normal.  Cardiovascular:     Rate and Rhythm: Normal rate and regular rhythm.     Pulses: Normal pulses.     Heart sounds: Normal heart sounds, S1 normal and S2 normal. No murmur. No friction rub. No gallop.   Pulmonary:     Effort: Pulmonary  effort is normal. No tachypnea or respiratory distress.     Breath sounds: Normal breath sounds. No decreased breath sounds, wheezing, rhonchi or rales.  Abdominal:     General: Bowel sounds are normal.     Palpations: Abdomen is soft.     Tenderness: There is no abdominal tenderness.  Musculoskeletal:     Cervical back: Neck supple. No rigidity. No pain with movement or muscular tenderness. Decreased range of motion.  Lymphadenopathy:     Cervical: No cervical adenopathy.  Skin:    General: Skin is warm and dry.  Findings: No rash.  Neurological:     Mental Status: She is alert.  Psychiatric:        Mood and Affect: Mood is not anxious or depressed.        Speech: Speech normal.        Behavior: Behavior normal. Behavior is cooperative.        Thought Content: Thought content normal.        Judgment: Judgment normal.      Assessment and Plan   Acute neck pain Now resolved. Likely muscle spasm vs OA.  If recurs use tylenol, massage, gentle ROM and heat.  Seborrheic dermatitis of scalp Will treat with ketoconazole shampoo. She will call /email for me to identify what tube of cream  From Derm to use on lesions on face.     Eliezer Lofts, MD

## 2019-09-26 NOTE — Telephone Encounter (Signed)
Have her put the ketoconazole cream on the skin lesions on her face.

## 2019-09-28 ENCOUNTER — Encounter (HOSPITAL_COMMUNITY): Payer: Self-pay

## 2019-09-28 ENCOUNTER — Other Ambulatory Visit: Payer: Self-pay

## 2019-09-28 ENCOUNTER — Ambulatory Visit (HOSPITAL_COMMUNITY)
Admission: EM | Admit: 2019-09-28 | Discharge: 2019-09-28 | Disposition: A | Discharge status: Home / Self Care | Payer: Medicare Other | Attending: Family Medicine | Admitting: Family Medicine

## 2019-09-28 DIAGNOSIS — H00014 Hordeolum externum left upper eyelid: Secondary | ICD-10-CM | POA: Diagnosis not present

## 2019-09-28 MED ORDER — ERYTHROMYCIN 5 MG/GM OP OINT
TOPICAL_OINTMENT | OPHTHALMIC | Status: AC
  Filled 2019-09-28: qty 3.5

## 2019-09-28 NOTE — ED Triage Notes (Signed)
Pt has swollen left eye, watery eyex2 days. PT denies pain. Pt states she "can't see as well out of the eye."

## 2019-09-28 NOTE — ED Provider Notes (Signed)
[facility]    CSN: [id number] Arrival date & time: 09/28/19  1356      History   Chief Complaint Chief Complaint  Patient presents with  . eye irritation    HPI [name] is a 86 y.o. female.   HPI  [name] 86 year old woman who lives alone.  Is brought in by her daughter for some redness and irritation in her left eye.  No trauma or injury.  No change in vision.  Patient has been using some Systane drops for discomfort.  This helps somewhat.  Daughter brought her in today because the lid looked more swollen to her.  They do have an eye doctor to follow-up with if needed. She is in very good health for 89.  Lives independently.  Past Medical History:  Diagnosis Date  . Acute pharyngitis   . Acute sinusitis, unspecified   . Acute upper respiratory infections of unspecified site   . Allergy   . Anal and rectal polyp   . Cataract    History of  . Cavernous angioma   . Degenerative cervical disc 02/2006  . Diabetes mellitus    Type II  . GERD (gastroesophageal reflux disease)   . Gout   . Hemangioma of unspecified site   . Hyperlipidemia   . Hypertension   . Orthostatic hypotension 04/2006   Hospital  . Osteoarthritis   . Osteopenia   . Other abscess of vulva   . Palpitations   . Syncope and collapse    1 episode    Patient Active Problem List   Diagnosis Date Noted  . Acute neck pain 09/26/2019  . Seborrheic dermatitis of scalp 09/26/2019  . Nocturia more than twice per night 08/22/2019  . Seborrheic keratoses 10/16/2017  . Hyponatremia 09/14/2017  . Left upper quadrant pain 08/07/2017  . Right hip pain 07/12/2017  . Hearing loss 02/20/2017  . TIA (transient ischemic attack), possible 11/14/2016  . Left carpal tunnel syndrome 08/11/2016  . Epigastric abdominal pain 06/15/2016  . Constipation, chronic 03/21/2016  . Near syncope 05/07/2015  . Osteoarthritis of right shoulder 05/04/2015  . Counseling regarding end of life decision making  01/15/2015  . Skin lesion of right leg 07/10/2014  . Insomnia 04/10/2014  . Osteoarthritis of right knee 10/14/2013  . Osteoarthritis of left knee 10/14/2013  . Infected sebaceous cyst 11/29/2012  . Gout, chronic, without tophus 12/13/2009  . Osteoporosis 10/29/2009  . PALPITATIONS, OCCASIONAL 07/07/2008  . Allergic rhinitis 06/02/2008  . HEMANGIOMA 07/30/2006  . Controlled type 2 diabetes mellitus without complication, without long-term current use of insulin ([place]) 07/30/2006  . Hyperlipidemia LDL goal <100 07/30/2006  . Essential hypertension, benign 07/30/2006  . GERD 07/30/2006  . RECTAL POLYPS 07/30/2006  . OSTEOARTHRITIS 07/30/2006  . CATARACT, HX OF 07/30/2006    Past Surgical History:  Procedure Laterality Date  . ABDOMINAL HYSTERECTOMY    . Aortic dopplers     Mild plaque.  EEG okay  . CATARACT EXTRACTION    . Cavernous angioma    . Cavernous hemangioma  04/2004   Hospital  . [place]     Right    OB History   No obstetric history on file.      Home Medications    Prior to Admission medications   Medication Sig Start Date End Date Taking? Authorizing Provider  ACCU-CHEK AVIVA PLUS test strip USE TO CHECK BLOOD SUGAR DAILY AS NEEDED. DX: E11.9 06/17/18   [name], MD  acetaminophen (TYLENOL) 325 MG tablet Take 650 mg by mouth 2 (two) times daily.     [provider]  aspirin EC 81 MG tablet Take 81 mg by mouth at bedtime.     [provider]  Blood Glucose Monitoring Suppl (ACCU-CHEK GUIDE) w/Device KIT 1 each by Does not apply route daily. Use to check blood sugar daily as needed.  Dx: E11.9 04/30/18   [name], MD  cetirizine (ZYRTEC) 10 MG tablet Take 10 mg by mouth daily.    [provider]  Fiber POWD Take by mouth See admin instructions. Mix 1 rounded teaspoonful into 6-8 ounces of water and drink three times a day    [provider]  fish oil-omega-3 fatty acids 1000 MG capsule Take 1,000 mg by  mouth daily with breakfast.     [provider]  fluticasone (FLONASE) 50 MCG/ACT nasal spray Place 2 sprays into both nostrils daily as needed for allergies. 03/31/19   Bedsole, Amy E, MD  ketoconazole (NIZORAL) 2 % shampoo Apply 1 application topically 2 (two) times a week. 09/29/19   Bedsole, Amy E, MD  Lancets (FREESTYLE) lancets CHECK BLOOD SUGAR 1-2 TIMES A DAY. DX: E11.9 NON INSULIN DEPENDENT 08/05/18   [name], MD  Multiple Vitamins-Minerals (ONE-A-DAY WOMENS 50+ ADVANTAGE) TABS Take 1 tablet by mouth daily with breakfast.    [provider]  omeprazole (PRILOSEC) 40 MG capsule TAKE 1 CAPSULE BY MOUTH EVERY DAY 07/21/19   Bedsole, Amy E, MD  oxybutynin (DITROPAN) 5 MG tablet Take 1 tablet (5 mg total) by mouth at bedtime. 08/22/19   Bedsole, Amy E, MD  polyethylene glycol (MIRALAX / GLYCOLAX) packet Take 17 g by mouth daily as needed for mild constipation (MIX AND DRINK).     [provider]  Propylene Glycol (SYSTANE BALANCE) 0.6 % SOLN Place 1-2 drops into both eyes daily.    [provider]  ramipril (ALTACE) 5 MG capsule TAKE 1 CAPSULE (5 MG TOTAL) BY MOUTH DAILY. 04/08/19   [name], MD    Family History Family History  Problem Relation Age of Onset  . Heart disease Other        CHF  . Cancer Maternal Aunt        Breast CA    Social History Social History   Tobacco Use  . Smoking status: Former Research scientist (life sciences)  . Smokeless tobacco: Never Used  . Tobacco comment: quit 1970  Substance Use Topics  . Alcohol use: Yes    Alcohol/week: 0.0 standard drinks    Comment: wine rarely  . Drug use: No     Allergies   Codeine and Statins   Review of Systems Review of Systems  HENT: Negative for congestion.   Eyes: Positive for pain and redness. Negative for photophobia, discharge, itching and visual disturbance.     Physical Exam Triage Vital Signs ED Triage Vitals [09/28/19 1501]  Enc Vitals Group     BP (!) 160/71     Pulse Rate  87     Resp 18     Temp (!) 97.5 F (36.4 C)     Temp Source Oral     SpO2 98 %     Weight 115 lb (52.2 kg)     Height 5' 3"  (1.6 m)     Head Circumference      Peak Flow      Pain Score 0     Pain Loc  Pain Edu?      Excl. in [place]?    No data found.  Updated Vital Signs BP (!) 160/71   Pulse 87   Temp (!) 97.5 F (36.4 C) (Oral)   Resp 18   Ht 5' 3"  (1.6 m)   Wt 52.2 kg   SpO2 98%   BMI 20.37 kg/m      Physical Exam Constitutional:      General: She is not in acute distress.    Appearance: She is well-developed and normal weight.  HENT:     Head: Normocephalic and atraumatic.     Mouth/Throat:     Comments: Mask is in place Eyes:     Conjunctiva/sclera: Conjunctivae normal.     Pupils: Pupils are equal, round, and reactive to light.      Comments: Stye is visible on the left outer eyelid.  With eversion of lid the pustule is apparent.  No drainage.  No conjunctival injection  Cardiovascular:     Rate and Rhythm: Normal rate.  Pulmonary:     Effort: Pulmonary effort is normal. No respiratory distress.  Abdominal:     General: There is no distension.     Palpations: Abdomen is soft.  Musculoskeletal:        General: Normal range of motion.     Cervical back: Normal range of motion.  Skin:    General: Skin is warm and dry.  Neurological:     Mental Status: She is alert.  Psychiatric:        Mood and Affect: Mood normal.        Behavior: Behavior normal.      UC Treatments / Results  Labs (all labs ordered are listed, but only abnormal results are displayed) Labs Reviewed - No data to display  EKG   Radiology No results found.  Procedures Procedures (including critical care time)  Medications Ordered in UC Medications - No data to display  Initial Impression / Assessment and Plan / UC Course  I have reviewed the triage vital signs and the nursing notes.  Pertinent labs & imaging results that were available during my care of the  patient were reviewed by me and considered in my medical decision making (see chart for details).     Discussed management.  Reasons for follow-up I put erythromycin ointment in the eye while patient was here in the office.  Discussed warm compresses I gave her the rest of the tube.  She will call her eye doctor if not better in a couple of days Final Clinical Impressions(s) / UC Diagnoses   Final diagnoses:  Hordeolum externum of left upper eyelid     Discharge Instructions     Use eye ointment to 3 times a day for the next few days  this should resolve by itself  may continue to use Systane as needed for comfort May use warm compress (warm water or washcloth) as needed     ED Prescriptions    None     PDMP not reviewed this encounter.   Raylene Everts, MD 09/28/19 [facility] Hare

## 2019-09-28 NOTE — Discharge Instructions (Signed)
Use eye ointment to 3 times a day for the next few days  this should resolve by itself  may continue to use Systane as needed for comfort May use warm compress (warm water or washcloth) as needed

## 2019-09-29 DIAGNOSIS — M19011 Primary osteoarthritis, right shoulder: Secondary | ICD-10-CM | POA: Diagnosis not present

## 2019-09-29 DIAGNOSIS — M47816 Spondylosis without myelopathy or radiculopathy, lumbar region: Secondary | ICD-10-CM | POA: Diagnosis not present

## 2019-09-29 DIAGNOSIS — M5030 Other cervical disc degeneration, unspecified cervical region: Secondary | ICD-10-CM | POA: Diagnosis not present

## 2019-09-29 DIAGNOSIS — M170 Bilateral primary osteoarthritis of knee: Secondary | ICD-10-CM | POA: Diagnosis not present

## 2019-09-29 DIAGNOSIS — M19042 Primary osteoarthritis, left hand: Secondary | ICD-10-CM | POA: Diagnosis not present

## 2019-09-29 DIAGNOSIS — M19041 Primary osteoarthritis, right hand: Secondary | ICD-10-CM | POA: Diagnosis not present

## 2019-09-30 DIAGNOSIS — D1809 Hemangioma of other sites: Secondary | ICD-10-CM | POA: Diagnosis not present

## 2019-09-30 DIAGNOSIS — M170 Bilateral primary osteoarthritis of knee: Secondary | ICD-10-CM | POA: Diagnosis not present

## 2019-09-30 DIAGNOSIS — M19011 Primary osteoarthritis, right shoulder: Secondary | ICD-10-CM | POA: Diagnosis not present

## 2019-09-30 DIAGNOSIS — M85842 Other specified disorders of bone density and structure, left hand: Secondary | ICD-10-CM | POA: Diagnosis not present

## 2019-09-30 DIAGNOSIS — Z9181 History of falling: Secondary | ICD-10-CM | POA: Diagnosis not present

## 2019-09-30 DIAGNOSIS — E119 Type 2 diabetes mellitus without complications: Secondary | ICD-10-CM | POA: Diagnosis not present

## 2019-09-30 DIAGNOSIS — M5030 Other cervical disc degeneration, unspecified cervical region: Secondary | ICD-10-CM | POA: Diagnosis not present

## 2019-09-30 DIAGNOSIS — K219 Gastro-esophageal reflux disease without esophagitis: Secondary | ICD-10-CM | POA: Diagnosis not present

## 2019-09-30 DIAGNOSIS — M19042 Primary osteoarthritis, left hand: Secondary | ICD-10-CM | POA: Diagnosis not present

## 2019-09-30 DIAGNOSIS — G8929 Other chronic pain: Secondary | ICD-10-CM | POA: Diagnosis not present

## 2019-09-30 DIAGNOSIS — Z87891 Personal history of nicotine dependence: Secondary | ICD-10-CM | POA: Diagnosis not present

## 2019-09-30 DIAGNOSIS — M109 Gout, unspecified: Secondary | ICD-10-CM | POA: Diagnosis not present

## 2019-09-30 DIAGNOSIS — E785 Hyperlipidemia, unspecified: Secondary | ICD-10-CM | POA: Diagnosis not present

## 2019-09-30 DIAGNOSIS — M47816 Spondylosis without myelopathy or radiculopathy, lumbar region: Secondary | ICD-10-CM | POA: Diagnosis not present

## 2019-09-30 DIAGNOSIS — I10 Essential (primary) hypertension: Secondary | ICD-10-CM | POA: Diagnosis not present

## 2019-09-30 DIAGNOSIS — M19041 Primary osteoarthritis, right hand: Secondary | ICD-10-CM | POA: Diagnosis not present

## 2019-09-30 DIAGNOSIS — M85841 Other specified disorders of bone density and structure, right hand: Secondary | ICD-10-CM | POA: Diagnosis not present

## 2019-09-30 DIAGNOSIS — M19012 Primary osteoarthritis, left shoulder: Secondary | ICD-10-CM | POA: Diagnosis not present

## 2019-10-02 DIAGNOSIS — H00024 Hordeolum internum left upper eyelid: Secondary | ICD-10-CM | POA: Diagnosis not present

## 2019-10-02 DIAGNOSIS — H0288A Meibomian gland dysfunction right eye, upper and lower eyelids: Secondary | ICD-10-CM | POA: Diagnosis not present

## 2019-10-02 DIAGNOSIS — H0288B Meibomian gland dysfunction left eye, upper and lower eyelids: Secondary | ICD-10-CM | POA: Diagnosis not present

## 2019-10-08 ENCOUNTER — Other Ambulatory Visit: Payer: Self-pay

## 2019-10-08 ENCOUNTER — Ambulatory Visit: Payer: Medicare Other | Admitting: Orthotics

## 2019-10-13 DIAGNOSIS — M5030 Other cervical disc degeneration, unspecified cervical region: Secondary | ICD-10-CM | POA: Diagnosis not present

## 2019-10-13 DIAGNOSIS — M19042 Primary osteoarthritis, left hand: Secondary | ICD-10-CM | POA: Diagnosis not present

## 2019-10-13 DIAGNOSIS — M170 Bilateral primary osteoarthritis of knee: Secondary | ICD-10-CM | POA: Diagnosis not present

## 2019-10-13 DIAGNOSIS — M19041 Primary osteoarthritis, right hand: Secondary | ICD-10-CM | POA: Diagnosis not present

## 2019-10-13 DIAGNOSIS — M19011 Primary osteoarthritis, right shoulder: Secondary | ICD-10-CM | POA: Diagnosis not present

## 2019-10-13 DIAGNOSIS — M47816 Spondylosis without myelopathy or radiculopathy, lumbar region: Secondary | ICD-10-CM | POA: Diagnosis not present

## 2019-10-15 ENCOUNTER — Telehealth: Payer: Self-pay

## 2019-10-15 DIAGNOSIS — M170 Bilateral primary osteoarthritis of knee: Secondary | ICD-10-CM

## 2019-10-15 DIAGNOSIS — M810 Age-related osteoporosis without current pathological fracture: Secondary | ICD-10-CM

## 2019-10-15 NOTE — Telephone Encounter (Signed)
[facility] [place], [place]     2:53 PM Note Pt due for next Prolia injection 09-16-19.  Pt has MCR and supp.  Pt would owe approximately $0.       Contacted pt and scheduled for lab on 6/29 and inj on 7/6. Added BMP order.

## 2019-10-16 ENCOUNTER — Telehealth: Payer: Self-pay | Admitting: Family Medicine

## 2019-10-16 NOTE — Telephone Encounter (Signed)
Patient called today to cancel her lab appt and her prolia appointment She stated she wanted to talk with Dr Diona Browner about it first.  Patient asked for a call back from Dr Diona Browner to talk to her about prolia.   Advised patient that an appointment may be needed for her to come in and further discuss with Dr Diona Browner. Patient stated if that's needed she will not do it.

## 2019-10-21 ENCOUNTER — Other Ambulatory Visit: Payer: Medicare Other

## 2019-10-21 NOTE — Telephone Encounter (Signed)
We can hold off on prolia and discuss with her and daughter at next routine OV.

## 2019-10-21 NOTE — Telephone Encounter (Signed)
Ms. Davilla notified as instructed by telephone.

## 2019-10-28 ENCOUNTER — Ambulatory Visit (INDEPENDENT_AMBULATORY_CARE_PROVIDER_SITE_OTHER): Payer: Medicare Other | Admitting: Podiatry

## 2019-10-28 ENCOUNTER — Encounter: Payer: Self-pay | Admitting: Podiatry

## 2019-10-28 ENCOUNTER — Other Ambulatory Visit: Payer: Self-pay

## 2019-10-28 ENCOUNTER — Ambulatory Visit: Payer: Medicare Other

## 2019-10-28 ENCOUNTER — Other Ambulatory Visit: Payer: Self-pay | Admitting: Family Medicine

## 2019-10-28 DIAGNOSIS — L84 Corns and callosities: Secondary | ICD-10-CM

## 2019-10-28 DIAGNOSIS — M19011 Primary osteoarthritis, right shoulder: Secondary | ICD-10-CM | POA: Diagnosis not present

## 2019-10-28 DIAGNOSIS — B351 Tinea unguium: Secondary | ICD-10-CM

## 2019-10-28 DIAGNOSIS — M5030 Other cervical disc degeneration, unspecified cervical region: Secondary | ICD-10-CM | POA: Diagnosis not present

## 2019-10-28 DIAGNOSIS — M79674 Pain in right toe(s): Secondary | ICD-10-CM | POA: Diagnosis not present

## 2019-10-28 DIAGNOSIS — M19042 Primary osteoarthritis, left hand: Secondary | ICD-10-CM | POA: Diagnosis not present

## 2019-10-28 DIAGNOSIS — M47816 Spondylosis without myelopathy or radiculopathy, lumbar region: Secondary | ICD-10-CM | POA: Diagnosis not present

## 2019-10-28 DIAGNOSIS — M19041 Primary osteoarthritis, right hand: Secondary | ICD-10-CM | POA: Diagnosis not present

## 2019-10-28 DIAGNOSIS — M79675 Pain in left toe(s): Secondary | ICD-10-CM | POA: Diagnosis not present

## 2019-10-28 DIAGNOSIS — M170 Bilateral primary osteoarthritis of knee: Secondary | ICD-10-CM | POA: Diagnosis not present

## 2019-10-28 DIAGNOSIS — E119 Type 2 diabetes mellitus without complications: Secondary | ICD-10-CM

## 2019-10-28 NOTE — Patient Instructions (Signed)
Diabetes Mellitus and Foot Care Foot care is an important part of your health, especially when you have diabetes. Diabetes may cause you to have problems because of poor blood flow (circulation) to your feet and legs, which can cause your skin to:  Become thinner and drier.  Break more easily.  Heal more slowly.  Peel and crack. You may also have nerve damage (neuropathy) in your legs and feet, causing decreased feeling in them. This means that you may not notice minor injuries to your feet that could lead to more serious problems. Noticing and addressing any potential problems early is the best way to prevent future foot problems. How to care for your feet Foot hygiene  Wash your feet daily with warm water and mild soap. Do not use hot water. Then, pat your feet and the areas between your toes until they are completely dry. Do not soak your feet as this can dry your skin.  Trim your toenails straight across. Do not dig under them or around the cuticle. File the edges of your nails with an emery board or nail file.  Apply a moisturizing lotion or petroleum jelly to the skin on your feet and to dry, brittle toenails. Use lotion that does not contain alcohol and is unscented. Do not apply lotion between your toes. Shoes and socks  Wear clean socks or stockings every day. Make sure they are not too tight. Do not wear knee-high stockings since they may decrease blood flow to your legs.  Wear shoes that fit properly and have enough cushioning. Always look in your shoes before you put them on to be sure there are no objects inside.  To break in new shoes, wear them for just a few hours a day. This prevents injuries on your feet. Wounds, scrapes, corns, and calluses  Check your feet daily for blisters, cuts, bruises, sores, and redness. If you cannot see the bottom of your feet, use a mirror or ask someone for help.  Do not cut corns or calluses or try to remove them with medicine.  If you  find a minor scrape, cut, or break in the skin on your feet, keep it and the skin around it clean and dry. You may clean these areas with mild soap and water. Do not clean the area with peroxide, alcohol, or iodine.  If you have a wound, scrape, corn, or callus on your foot, look at it several times a day to make sure it is healing and not infected. Check for: ? Redness, swelling, or pain. ? Fluid or blood. ? Warmth. ? Pus or a bad smell. General instructions  Do not cross your legs. This may decrease blood flow to your feet.  Do not use heating pads or hot water bottles on your feet. They may burn your skin. If you have lost feeling in your feet or legs, you may not know this is happening until it is too late.  Protect your feet from hot and cold by wearing shoes, such as at the beach or on hot pavement.  Schedule a complete foot exam at least once a year (annually) or more often if you have foot problems. If you have foot problems, report any cuts, sores, or bruises to your health care provider immediately. Contact a health care provider if:  You have a medical condition that increases your risk of infection and you have any cuts, sores, or bruises on your feet.  You have an injury that is not   healing.  You have redness on your legs or feet.  You feel burning or tingling in your legs or feet.  You have pain or cramps in your legs and feet.  Your legs or feet are numb.  Your feet always feel cold.  You have pain around a toenail. Get help right away if:  You have a wound, scrape, corn, or callus on your foot and: ? You have pain, swelling, or redness that gets worse. ? You have fluid or blood coming from the wound, scrape, corn, or callus. ? Your wound, scrape, corn, or callus feels warm to the touch. ? You have pus or a bad smell coming from the wound, scrape, corn, or callus. ? You have a fever. ? You have a red line going up your leg. Summary  Check your feet every day  for cuts, sores, red spots, swelling, and blisters.  Moisturize feet and legs daily.  Wear shoes that fit properly and have enough cushioning.  If you have foot problems, report any cuts, sores, or bruises to your health care provider immediately.  Schedule a complete foot exam at least once a year (annually) or more often if you have foot problems. This information is not intended to replace advice given to you by your health care provider. Make sure you discuss any questions you have with your health care provider. Document Revised: 01/01/2019 Document Reviewed: 05/12/2016 Elsevier Patient Education  2020 [employer]  

## 2019-10-31 NOTE — Progress Notes (Signed)
Subjective:  Patient ID: [name], female    DOB: May 07, 1922,  MRN: [id number]  86 y.o. female presents with preventative diabetic foot care and painful callus(es) right great toe and painful thick toenails that are difficult to trim. Pain interferes with ambulation. Aggravating factors include wearing enclosed shoe gear. Pain is relieved with periodic professional debridement.   She is inquiring about status of her diabetic shoes.  Review of Systems: Negative except as noted in the HPI.  Past Medical History:  Diagnosis Date  . Acute pharyngitis   . Acute sinusitis, unspecified   . Acute upper respiratory infections of unspecified site   . Allergy   . Anal and rectal polyp   . Cataract    History of  . Cavernous angioma   . Degenerative cervical disc 02/2006  . Diabetes mellitus    Type II  . GERD (gastroesophageal reflux disease)   . Gout   . Hemangioma of unspecified site   . Hyperlipidemia   . Hypertension   . Orthostatic hypotension 04/2006   [facility]ospital  . Osteoarthritis   . Osteopenia   . Other abscess of vulva   . Palpitations   . Syncope and collapse    1 episode   Past Surgical History:  Procedure Laterality Date  . ABDOMINAL HYSTERECTOMY    . Aortic dopplers     Mild plaque.  EEG okay  . CATARACT EXTRACTION    . Cavernous angioma    . Cavernous hemangioma  04/2004   Hospital  . TOTAL KNEE ARTHROPLASTY     Right   Patient Active Problem List   Diagnosis Date Noted  . Acute neck pain 09/26/2019  . Seborrheic dermatitis of scalp 09/26/2019  . Nocturia more than twice per night 08/22/2019  . Seborrheic keratoses 10/16/2017  . Hyponatremia 09/14/2017  . Left upper quadrant pain 08/07/2017  . Right hip pain 07/12/2017  . Hearing loss 02/20/2017  . TIA (transient ischemic attack), possible 11/14/2016  . Left carpal tunnel syndrome 08/11/2016  . Epigastric abdominal pain 06/15/2016  . Constipation, chronic 03/21/2016  . Near syncope 05/07/2015  .  Osteoarthritis of right shoulder 05/04/2015  . Counseling regarding end of life decision making 01/15/2015  . Skin lesion of right leg 07/10/2014  . Insomnia 04/10/2014  . Osteoarthritis of right knee 10/14/2013  . Osteoarthritis of left knee 10/14/2013  . Infected sebaceous cyst 11/29/2012  . Gout, chronic, without tophus 12/13/2009  . Osteoporosis 10/29/2009  . PALPITATIONS, OCCASIONAL 07/07/2008  . Allergic rhinitis 06/02/2008  . HEMANGIOMA 07/30/2006  . Controlled type 2 diabetes mellitus without complication, without long-term current use of insulin ([facility]) 07/30/2006  . Hyperlipidemia LDL goal <100 07/30/2006  . Essential hypertension, benign 07/30/2006  . GERD 07/30/2006  . RECTAL POLYPS 07/30/2006  . OSTEOARTHRITIS 07/30/2006  . CATARACT, HX OF 07/30/2006    Current Outpatient Medications:  .  ACCU-CHEK AVIVA PLUS test strip, USE TO CHECK BLOOD SUGAR DAILY AS NEEDED. DX: E11.9, Disp: 100 each, Rfl: 3 .  acetaminophen (TYLENOL) 325 MG tablet, Take 650 mg by mouth 2 (two) times daily. , Disp: , Rfl:  .  aspirin EC 81 MG tablet, Take 81 mg by mouth at bedtime. , Disp: , Rfl:  .  Blood Glucose Monitoring Suppl (ACCU-CHEK GUIDE) w/Device KIT, 1 each by Does not apply route daily. Use to check blood sugar daily as needed.  Dx: E11.9, Disp: 1 kit, Rfl: 0 .  cetirizine (ZYRTEC) 10 MG tablet, Take 10 mg by mouth  daily., Disp: , Rfl:  .  erythromycin ophthalmic ointment, SMARTSIG:0.25 Unit(s) Left Eye 3 Times Daily, Disp: , Rfl:  .  Fiber POWD, Take by mouth See admin instructions. Mix 1 rounded teaspoonful into 6-8 ounces of water and drink three times a day, Disp: , Rfl:  .  fish oil-omega-3 fatty acids 1000 MG capsule, Take 1,000 mg by mouth daily with breakfast. , Disp: , Rfl:  .  fluticasone (FLONASE) 50 MCG/ACT nasal spray, Place 2 sprays into both nostrils daily as needed for allergies., Disp: 16 g, Rfl: 3 .  ketoconazole (NIZORAL) 2 % shampoo, Apply 1 application topically 2 (two)  times a week., Disp: 120 mL, Rfl: 2 .  Lancets (FREESTYLE) lancets, CHECK BLOOD SUGAR 1-2 TIMES A DAY. DX: E11.9 NON INSULIN DEPENDENT, Disp: 100 each, Rfl: 3 .  MAXITROL 3.5-10000-0.1 OINT, Apply 1 Small Amount Both Eyes twice a day, Disp: , Rfl:  .  Multiple Vitamins-Minerals (ONE-A-DAY WOMENS 50+ ADVANTAGE) TABS, Take 1 tablet by mouth daily with breakfast., Disp: , Rfl:  .  omeprazole (PRILOSEC) 40 MG capsule, TAKE 1 CAPSULE BY MOUTH EVERY DAY, Disp: 90 capsule, Rfl: 1 .  oxybutynin (DITROPAN) 5 MG tablet, Take 1 tablet (5 mg total) by mouth at bedtime., Disp: 30 tablet, Rfl: 3 .  polyethylene glycol (MIRALAX / GLYCOLAX) packet, Take 17 g by mouth daily as needed for mild constipation (MIX AND DRINK). , Disp: , Rfl:  .  Propylene Glycol (SYSTANE BALANCE) 0.6 % SOLN, Place 1-2 drops into both eyes daily., Disp: , Rfl:  .  ramipril (ALTACE) 5 MG capsule, TAKE 1 CAPSULE BY MOUTH DAILY, Disp: 90 capsule, Rfl: 1 Allergies  Allergen Reactions  . Codeine Nausea And Vomiting  . Statins Other (See Comments)    Pt does not remember the reaction   Social History   Tobacco Use  Smoking Status Former Smoker  Smokeless Tobacco Never Used  Tobacco Comment   quit 1970   Objective:  There were no vitals filed for this visit. Constitutional Patient is a pleasant 86 y.o. Caucasian female, in NAD.Marland Kitchen  Vascular Capillary fill time to digits <3 seconds b/l lower extremities. Palpable pedal pulses b/l LE. Pedal hair absent. Lower extremity skin temperature gradient within normal limits. Capillary refill normal to all digits.  No cyanosis or clubbing noted.  Neurologic Normal speech. Oriented to person, place, and time. Protective sensation intact 5/5 intact bilaterally with 10g monofilament b/l. Vibratory sensation intact b/l. Clonus negative b/l.  Dermatologic Pedal skin is thin shiny, atrophic b/l lower extremities. No open wounds bilaterally. No interdigital macerations bilaterally. Toenails 1-5 b/l  elongated, discolored, dystrophic, thickened, crumbly with subungual debris and tenderness to dorsal palpation. Hyperkeratotic lesion(s) R hallux.  No erythema, no edema, no drainage, no flocculence.  Orthopedic: Normal muscle strength 5/5 to all lower extremity muscle groups bilaterally. No pain crepitus or joint limitation noted with ROM b/l. Hallux valgus with bunion deformity noted b/l lower extremities. Hammertoes noted to the 2-5 bilaterally. Utilizes cane for ambulation assistance.   Radiographs: None Assessment:  No diagnosis found. Plan:  Patient was evaluated and treated and all questions answered.  Onychomycosis with pain -Nails palliatively debridement as below. -Educated on self-care  Procedure: Nail Debridement Rationale: Pain Type of Debridement: manual, sharp debridement. Instrumentation: Nail nipper, rotary burr. Number of Nails: 10  -Examined patient. -No new findings. No new orders. -Continue diabetic foot care principles. -Spoke to [employer] in [employer] office. Her diabetic shoes should ship from vendor this week.  Our office will give Ms. Landgren a call when they arrive. -Toenails 1-5 b/l were debrided in length and girth with sterile nail nippers and dremel without iatrogenic bleeding.  -Callus(es) R hallux pared utilizing sterile scalpel blade without complication or incident. Total number debrided =1. -Patient to report any pedal injuries to medical professional immediately. -Patient to continue soft, supportive shoe gear daily. -Patient/POA to call should there be question/concern in the interim.  Return in about 3 months (around 01/28/2020) for diabetic nail and callus trim.  Marzetta Board, DPM

## 2019-11-05 ENCOUNTER — Telehealth: Payer: Self-pay

## 2019-11-05 NOTE — Telephone Encounter (Signed)
FL-2 form printed and placed in Dr. Rometta Emery office in box to complete.

## 2019-11-05 NOTE — Telephone Encounter (Signed)
Pt left v/m requesting FL2 form filled out for pt to go into a retirement home. Pt request cb when FL2 form ready for pick up.

## 2019-11-05 NOTE — Telephone Encounter (Signed)
Will complete on Friday when back in office.

## 2019-11-06 DIAGNOSIS — R053 Chronic cough: Secondary | ICD-10-CM | POA: Insufficient documentation

## 2019-11-06 DIAGNOSIS — H5710 Ocular pain, unspecified eye: Secondary | ICD-10-CM | POA: Insufficient documentation

## 2019-11-06 NOTE — Assessment & Plan Note (Addendum)
Have eye eval a planned by ophtamology. Possibly due to allergies.

## 2019-11-06 NOTE — Assessment & Plan Note (Signed)
Likely due to medication SE. Stop oxybutynin.  Stop cetrizine and change to Claritin (loratadine)

## 2019-11-06 NOTE — Assessment & Plan Note (Addendum)
Eval with CXR.  Possibly due to allergies.

## 2019-11-10 ENCOUNTER — Other Ambulatory Visit: Payer: Self-pay

## 2019-11-10 DIAGNOSIS — E119 Type 2 diabetes mellitus without complications: Secondary | ICD-10-CM

## 2019-11-10 MED ORDER — FREESTYLE LANCETS MISC: 3 refills | Status: DC

## 2019-11-10 MED ORDER — ACCU-CHEK AVIVA PLUS VI STRP: ORAL_STRIP | 3 refills | Status: DC

## 2019-11-10 NOTE — Telephone Encounter (Signed)
FL-2 as placed in my in box but nothing about been completed by Dr. Diona Browner.  Placed form back in Dr. Rometta Emery in box to complete.

## 2019-11-11 ENCOUNTER — Other Ambulatory Visit: Payer: Self-pay | Admitting: Family Medicine

## 2019-11-11 DIAGNOSIS — E119 Type 2 diabetes mellitus without complications: Secondary | ICD-10-CM

## 2019-11-11 MED ORDER — ACCU-CHEK AVIVA PLUS VI STRP: ORAL_STRIP | 3 refills | Status: DC

## 2019-11-11 MED ORDER — FREESTYLE LANCETS MISC: 3 refills | Status: DC

## 2019-11-11 NOTE — Telephone Encounter (Signed)
Pt called back and states that her daughter is going to pick up this form - please do not mail to patient.

## 2019-11-11 NOTE — Addendum Note (Signed)
Addended by: Carter Kitten on: 11/11/2019 02:24 PM   Modules accepted: Orders

## 2019-11-11 NOTE — Telephone Encounter (Signed)
Ms. Stargell notified FL-2 form is ready. She ask that we mail it to her home address.  Mailed as requested.

## 2019-11-11 NOTE — Telephone Encounter (Signed)
FL-2 form placed up front for pick up.

## 2019-11-12 ENCOUNTER — Other Ambulatory Visit: Payer: Self-pay

## 2019-11-12 ENCOUNTER — Ambulatory Visit: Payer: Medicare Other | Admitting: Orthotics

## 2019-11-12 DIAGNOSIS — E119 Type 2 diabetes mellitus without complications: Secondary | ICD-10-CM

## 2019-11-12 DIAGNOSIS — M2041 Other hammer toe(s) (acquired), right foot: Secondary | ICD-10-CM

## 2019-11-12 DIAGNOSIS — L84 Corns and callosities: Secondary | ICD-10-CM

## 2019-11-12 DIAGNOSIS — M2042 Other hammer toe(s) (acquired), left foot: Secondary | ICD-10-CM

## 2019-11-12 NOTE — Telephone Encounter (Signed)
Noted  

## 2019-11-12 NOTE — Telephone Encounter (Signed)
Pt scheduled for OV 11-21-19.

## 2019-11-19 ENCOUNTER — Telehealth: Payer: Self-pay

## 2019-11-19 NOTE — Telephone Encounter (Signed)
Patient contacted the office and states that her family is looking into placing patient at [facility]. They contacted the representative at [facility] named Andree Elk, and she will need patient's medication list and demographics faxed to their facility at [phone]. I have faxed this information to their facility and will send FYI to Sandrea Hughs there needs further follow up.

## 2019-11-19 NOTE — Telephone Encounter (Signed)
Noted.  FYI to Dr. Bedsole. 

## 2019-11-21 ENCOUNTER — Encounter: Payer: Self-pay | Admitting: Family Medicine

## 2019-11-21 ENCOUNTER — Ambulatory Visit (INDEPENDENT_AMBULATORY_CARE_PROVIDER_SITE_OTHER): Payer: Medicare Other | Admitting: Family Medicine

## 2019-11-21 ENCOUNTER — Other Ambulatory Visit: Payer: Self-pay

## 2019-11-21 VITALS — BP 120/72 | HR 81 | Temp 97.9°F | Ht 63.0 in | Wt 117.5 lb

## 2019-11-21 DIAGNOSIS — M159 Polyosteoarthritis, unspecified: Secondary | ICD-10-CM | POA: Diagnosis not present

## 2019-11-21 DIAGNOSIS — L989 Disorder of the skin and subcutaneous tissue, unspecified: Secondary | ICD-10-CM

## 2019-11-21 NOTE — Progress Notes (Signed)
Chief Complaint  Patient presents with  . Follow-up    3 month    History of Present Illness: HPI   86 year old female presents for 3 months follow up. Her family is working on setting up nursing home placement. She is less active, less mobile. Needs help with care.  She is using walker regularly given frequent falls.  Needs refill of triamcinolone for dermatitis on right lower leg.  Lab Results  Component Value Date   HGBA1C 5.5 08/22/2019     This visit occurred during the SARS-CoV-2 public health emergency.  Safety protocols were in place, including screening questions prior to the visit, additional usage of staff PPE, and extensive cleaning of exam room while observing appropriate contact time as indicated for disinfecting solutions.   COVID 19 screen:  No recent travel or known exposure to COVID19 The patient denies respiratory symptoms of COVID 19 at this time. The importance of social distancing was discussed today.     Review of Systems  Constitutional: Negative for chills and fever.  HENT: Negative for congestion and ear pain.   Eyes: Negative for pain and redness.  Respiratory: Negative for cough and shortness of breath.   Cardiovascular: Negative for chest pain, palpitations and leg swelling.  Gastrointestinal: Positive for constipation. Negative for abdominal pain, blood in stool, diarrhea, nausea and vomiting.  Genitourinary: Negative for dysuria.  Musculoskeletal: Negative for falls and myalgias.  Skin: Negative for rash.  Neurological: Negative for dizziness.  Psychiatric/Behavioral: Negative for depression. The patient is not nervous/anxious.       Past Medical History:  Diagnosis Date  . Acute pharyngitis   . Acute sinusitis, unspecified   . Acute upper respiratory infections of unspecified site   . Allergy   . Anal and rectal polyp   . Cataract    History of  . Cavernous angioma   . Degenerative cervical disc 02/2006  . Diabetes mellitus     Type II  . GERD (gastroesophageal reflux disease)   . Gout   . Hemangioma of unspecified site   . Hyperlipidemia   . Hypertension   . Orthostatic hypotension 04/2006   Hospital  . Osteoarthritis   . Osteopenia   . Other abscess of vulva   . Palpitations   . Syncope and collapse    1 episode    reports that she has quit smoking. She has never used smokeless tobacco. She reports current alcohol use. She reports that she does not use drugs.   Current Outpatient Medications:  .  acetaminophen (TYLENOL) 325 MG tablet, Take 650 mg by mouth 2 (two) times daily. , Disp: , Rfl:  .  aspirin EC 81 MG tablet, Take 81 mg by mouth at bedtime. , Disp: , Rfl:  .  Blood Glucose Monitoring Suppl (ACCU-CHEK GUIDE) w/Device KIT, 1 each by Does not apply route daily. Use to check blood sugar daily as needed.  Dx: E11.9, Disp: 1 kit, Rfl: 0 .  Fiber POWD, Take by mouth See admin instructions. Mix 1 rounded teaspoonful into 6-8 ounces of water and drink three times a day, Disp: , Rfl:  .  fluticasone (FLONASE) 50 MCG/ACT nasal spray, Place 2 sprays into both nostrils daily as needed for allergies., Disp: 16 g, Rfl: 3 .  glucose blood (ACCU-CHEK AVIVA PLUS) test strip, Use to check blood sugar 1 time daily, Disp: 100 strip, Rfl: 3 .  ketoconazole (NIZORAL) 2 % shampoo, Apply 1 application topically 2 (two) times a week., Disp:  120 mL, Rfl: 2 .  Lancets (FREESTYLE) lancets, CHECK BLOOD SUGAR 1 TIME A DAY. DX: E11.9 NON INSULIN DEPENDENT, Disp: 100 each, Rfl: 3 .  loratadine (CLARITIN) 10 MG tablet, Take 10 mg by mouth daily., Disp: , Rfl:  .  Multiple Vitamins-Minerals (ONE-A-DAY WOMENS 50+ ADVANTAGE) TABS, Take 1 tablet by mouth daily with breakfast., Disp: , Rfl:  .  polyethylene glycol (MIRALAX / GLYCOLAX) packet, Take 17 g by mouth daily as needed for mild constipation (MIX AND DRINK). , Disp: , Rfl:  .  Propylene Glycol (SYSTANE BALANCE) 0.6 % SOLN, Place 1-2 drops into both eyes daily., Disp: , Rfl:  .   ramipril (ALTACE) 5 MG capsule, TAKE 1 CAPSULE BY MOUTH DAILY, Disp: 90 capsule, Rfl: 1   Observations/Objective: Blood pressure 120/72, pulse 81, temperature 97.9 F (36.6 C), temperature source Temporal, height _0  (1.6 m), weight 117 lb 8 oz (53.3 kg), SpO2 98 %.  Physical Exam Constitutional:      General: She is not in acute distress.    Appearance: Normal appearance. She is well-developed. She is not ill-appearing or toxic-appearing.  HENT:     Head: Normocephalic.     Right Ear: Hearing, tympanic membrane, ear canal and external ear normal. Tympanic membrane is not erythematous, retracted or bulging.     Left Ear: Hearing, tympanic membrane, ear canal and external ear normal. Tympanic membrane is not erythematous, retracted or bulging.     Nose: No mucosal edema or rhinorrhea.     Right Sinus: No maxillary sinus tenderness or frontal sinus tenderness.     Left Sinus: No maxillary sinus tenderness or frontal sinus tenderness.     Mouth/Throat:     Pharynx: Uvula midline.  Eyes:     General: Lids are normal. Lids are everted, no foreign bodies appreciated.     Conjunctiva/sclera: Conjunctivae normal.     Pupils: Pupils are equal, round, and reactive to light.  Neck:     Thyroid: No thyroid mass or thyromegaly.     Vascular: No carotid bruit.     Trachea: Trachea normal.  Cardiovascular:     Rate and Rhythm: Normal rate and regular rhythm.     Pulses: Normal pulses.     Heart sounds: Normal heart sounds, S1 normal and S2 normal. No murmur heard.  No friction rub. No gallop.   Pulmonary:     Effort: Pulmonary effort is normal. No tachypnea or respiratory distress.     Breath sounds: Normal breath sounds. No decreased breath sounds, wheezing, rhonchi or rales.  Abdominal:     General: Bowel sounds are normal.     Palpations: Abdomen is soft.     Tenderness: There is no abdominal tenderness.  Musculoskeletal:     Cervical back: Normal range of motion and neck supple.   Skin:    General: Skin is warm and dry.     Findings: No rash.  Neurological:     Mental Status: She is alert.  Psychiatric:        Mood and Affect: Mood is not anxious or depressed.        Speech: Speech normal.        Behavior: Behavior normal. Behavior is cooperative.        Thought Content: Thought content normal.        Judgment: Judgment normal.      Assessment and Plan    Eliezer Lofts, MD

## 2019-11-21 NOTE — Patient Instructions (Addendum)
Can use triamcinolone cream on right lower leg twice daily for  Rash.. no longer than 2 weeks.  For arthritis pain can use tylenol extras strength 650 mg  2 times daily for pain. If that does not help you can use diclofenac gel/cream over the counter

## 2019-12-02 NOTE — Telephone Encounter (Signed)
Family is looking at nursing home placement.

## 2019-12-16 ENCOUNTER — Ambulatory Visit (INDEPENDENT_AMBULATORY_CARE_PROVIDER_SITE_OTHER): Payer: Medicare Other | Admitting: Family Medicine

## 2019-12-16 ENCOUNTER — Encounter: Payer: Self-pay | Admitting: Family Medicine

## 2019-12-16 ENCOUNTER — Other Ambulatory Visit: Payer: Self-pay

## 2019-12-16 VITALS — BP 100/60 | HR 84 | Temp 98.5°F | Ht 63.0 in | Wt 117.5 lb

## 2019-12-16 DIAGNOSIS — R350 Frequency of micturition: Secondary | ICD-10-CM | POA: Insufficient documentation

## 2019-12-16 LAB — POC URINALSYSI DIPSTICK (AUTOMATED)
Bilirubin, UA: NEGATIVE
Glucose, UA: NEGATIVE
Ketones, UA: NEGATIVE
Nitrite, UA: POSITIVE
Protein, UA: POSITIVE — AB
Spec Grav, UA: 1.02 (ref 1.010–1.025)
Urobilinogen, UA: 0.2 E.U./dL
pH, UA: 6 (ref 5.0–8.0)

## 2019-12-16 MED ORDER — CEPHALEXIN 500 MG PO CAPS: 500.0000 mg | ORAL_CAPSULE | Freq: Three times a day (TID) | ORAL | 0 refills | Status: DC

## 2019-12-16 NOTE — Patient Instructions (Signed)
Start antibiotics. Increase water. We will call with culture results.Marland Kitchen

## 2019-12-16 NOTE — Assessment & Plan Note (Signed)
Likely UTI. Treat with keflx and verify with culture. Increase water and avoid bladder triggers.

## 2019-12-16 NOTE — Progress Notes (Signed)
Chief Complaint  Patient presents with  . Urinary Frequency    History of Present Illness: Urinary Tract Infection  This is a new problem. The current episode started in the past 7 days. The problem has been gradually worsening. The quality of the pain is described as burning. The pain is mild. There has been no fever. She is not sexually active. There is no history of pyelonephritis. Associated symptoms include frequency and urgency. Pertinent negatives include no chills, discharge, hematuria, nausea or vomiting. She has tried increased fluids for the symptoms. The treatment provided mild relief. There is no history of catheterization, recurrent UTIs, a single kidney, urinary stasis or a urological procedure.    No recent UTIs.  No recent antibiotics.   This visit occurred during the SARS-CoV-2 public health emergency.  Safety protocols were in place, including screening questions prior to the visit, additional usage of staff PPE, and extensive cleaning of exam room while observing appropriate contact time as indicated for disinfecting solutions.   COVID 19 screen:  No recent travel or known exposure to COVID19 The patient denies respiratory symptoms of COVID 19 at this time. The importance of social distancing was discussed today.     Review of Systems  Constitutional: Negative for chills.  Gastrointestinal: Negative for nausea and vomiting.  Genitourinary: Positive for frequency and urgency. Negative for hematuria.      Past Medical History:  Diagnosis Date  . Acute pharyngitis   . Acute sinusitis, unspecified   . Acute upper respiratory infections of unspecified site   . Allergy   . Anal and rectal polyp   . Cataract    History of  . Cavernous angioma   . Degenerative cervical disc 02/2006  . Diabetes mellitus    Type II  . GERD (gastroesophageal reflux disease)   . Gout   . Hemangioma of unspecified site   . Hyperlipidemia   . Hypertension   . Orthostatic  hypotension 04/2006   Hospital  . Osteoarthritis   . Osteopenia   . Other abscess of vulva   . Palpitations   . Syncope and collapse    1 episode    reports that she has quit smoking. She has never used smokeless tobacco. She reports current alcohol use. She reports that she does not use drugs.   Current Outpatient Medications:  .  acetaminophen (TYLENOL) 325 MG tablet, Take 650 mg by mouth 2 (two) times daily. , Disp: , Rfl:  .  aspirin EC 81 MG tablet, Take 81 mg by mouth at bedtime. , Disp: , Rfl:  .  Blood Glucose Monitoring Suppl (ACCU-CHEK GUIDE) w/Device KIT, 1 each by Does not apply route daily. Use to check blood sugar daily as needed.  Dx: E11.9, Disp: 1 kit, Rfl: 0 .  Fiber POWD, Take by mouth See admin instructions. Mix 1 rounded teaspoonful into 6-8 ounces of water and drink three times a day, Disp: , Rfl:  .  fluticasone (FLONASE) 50 MCG/ACT nasal spray, Place 2 sprays into both nostrils daily as needed for allergies., Disp: 16 g, Rfl: 3 .  glucose blood (ACCU-CHEK AVIVA PLUS) test strip, Use to check blood sugar 1 time daily, Disp: 100 strip, Rfl: 3 .  ketoconazole (NIZORAL) 2 % shampoo, Apply 1 application topically 2 (two) times a week., Disp: 120 mL, Rfl: 2 .  Lancets (FREESTYLE) lancets, CHECK BLOOD SUGAR 1 TIME A DAY. DX: E11.9 NON INSULIN DEPENDENT, Disp: 100 each, Rfl: 3 .  loratadine (CLARITIN) 10 MG  tablet, Take 10 mg by mouth daily., Disp: , Rfl:  .  Multiple Vitamins-Minerals (ONE-A-DAY WOMENS 50+ ADVANTAGE) TABS, Take 1 tablet by mouth daily with breakfast., Disp: , Rfl:  .  polyethylene glycol (MIRALAX / GLYCOLAX) packet, Take 17 g by mouth daily as needed for mild constipation (MIX AND DRINK). , Disp: , Rfl:  .  Propylene Glycol (SYSTANE BALANCE) 0.6 % SOLN, Place 1-2 drops into both eyes daily., Disp: , Rfl:  .  ramipril (ALTACE) 5 MG capsule, TAKE 1 CAPSULE BY MOUTH DAILY, Disp: 90 capsule, Rfl: 1   Observations/Objective: Blood pressure 100/60, pulse 84,  temperature 98.5 F (36.9 C), temperature source Temporal, height 5' 3"  (1.6 m), weight 117 lb 8 oz (53.3 kg), SpO2 97 %.  Physical Exam Constitutional:      General: She is not in acute distress.    Appearance: She is well-developed. She is not ill-appearing or toxic-appearing.  HENT:     Head: Normocephalic.     Right Ear: Hearing, tympanic membrane, ear canal and external ear normal. Tympanic membrane is not erythematous, retracted or bulging.     Left Ear: Hearing, tympanic membrane, ear canal and external ear normal. Tympanic membrane is not erythematous, retracted or bulging.     Nose: Mucosal edema present. No rhinorrhea.     Right Sinus: No maxillary sinus tenderness or frontal sinus tenderness.     Left Sinus: No maxillary sinus tenderness or frontal sinus tenderness.     Mouth/Throat:     Pharynx: Uvula midline.  Eyes:     General: Lids are normal. Lids are everted, no foreign bodies appreciated.     Conjunctiva/sclera: Conjunctivae normal.     Pupils: Pupils are equal, round, and reactive to light.  Neck:     Thyroid: No thyroid mass or thyromegaly.     Vascular: No carotid bruit.     Trachea: Trachea normal.  Cardiovascular:     Rate and Rhythm: Normal rate and regular rhythm.     Pulses: Normal pulses.     Heart sounds: Normal heart sounds, S1 normal and S2 normal. No murmur heard.  No friction rub. No gallop.   Pulmonary:     Effort: Pulmonary effort is normal. No tachypnea or respiratory distress.     Breath sounds: Normal breath sounds. No decreased breath sounds, wheezing, rhonchi or rales.  Abdominal:     General: There is no distension.     Tenderness: There is no abdominal tenderness. There is no right CVA tenderness, left CVA tenderness, guarding or rebound.  Musculoskeletal:     Cervical back: Normal range of motion and neck supple.  Skin:    General: Skin is warm and dry.     Findings: No rash.  Neurological:     Mental Status: She is alert.   Psychiatric:        Mood and Affect: Mood is not anxious or depressed.        Speech: Speech normal.        Behavior: Behavior normal. Behavior is cooperative.        Judgment: Judgment normal.      Assessment and Plan    Urinary frequency Likely UTI. Treat with keflx and verify with culture. Increase water and avoid bladder triggers.    Eliezer Lofts, MD

## 2019-12-18 ENCOUNTER — Telehealth: Payer: Self-pay | Admitting: Family Medicine

## 2019-12-18 NOTE — Telephone Encounter (Signed)
Pt would like a call back about her results. I let her know that it could be we are waiting for her provider to get results before calling.

## 2019-12-18 NOTE — Telephone Encounter (Signed)
Pt left msg on triage requesting results   Pt reports she is still having urinary frequency but now she is having abdominal pain  Urine culture results are back   Escherichia coliAbnormal P    Comment: Greater than 100,000 CFU/mL of Escherichia coli , susceptibility test report to follow.

## 2019-12-18 NOTE — Telephone Encounter (Signed)
Urine culture is still preliminary.  Still waiting on sensitivities.

## 2019-12-19 LAB — URINE CULTURE
MICRO NUMBER:: 10865029
SPECIMEN QUALITY:: ADEQUATE

## 2019-12-19 NOTE — Telephone Encounter (Signed)
See result note:    Ms. Fite notified of urine culture results.  She states she started the antibiotic the day it was prescribed on 12/16/2019.  She states she thought the antibiotic was going to make her sick but she drank a lot of water and feels better.

## 2019-12-22 ENCOUNTER — Telehealth: Payer: Self-pay

## 2019-12-22 NOTE — Telephone Encounter (Signed)
[facility] - Client TELEPHONE ADVICE RECORD [facility] Patient Name: [name] Gender: Female DOB: [birth year] Age: 86 Y 32 M 13 D Return Phone Number: [phone] (Primary) Address: [address] City/State/Zip: [name] [address] Client [name] - Client Client Site [place] - Day Physician Eliezer Lofts - MD Contact Type Call Who Is Calling Patient / Member / Family / Caregiver Call Type Triage / Clinical Relationship To Patient Self Return Phone Number [phone] (Primary) Chief Complaint ABDOMINAL PAIN - Severe and only in abdomen Reason for Call Symptomatic / Request for [place] states she has severe left abd pain. Started today around lunch time. Has not had a bm after Miralax. Translation No Nurse Assessment Nurse: Jimmye Norman, RN, Ailene Ravel Date/Time [name] Time): 12/22/2019 2:45:45 PM Confirm and document reason for call. If symptomatic, describe symptoms. ---Caller states was dx with UTI on antibiotics last week. Stomach is distended today and pharmacist recommended miralax. Abdominal pain on left side Has the patient had close contact with a person known or suspected to have the novel coronavirus illness OR traveled / lives in area with major community spread (including international travel) in the last 14 days from the onset of symptoms? * If Asymptomatic, screen for exposure and travel within the last 14 days. ---No Does the patient have any new or worsening symptoms? ---Yes Will a triage be completed? ---Yes Related visit to physician within the last 2 weeks? ---No Does the PT have any chronic conditions? (i.e. diabetes, asthma, this includes High risk factors for pregnancy, etc.) ---Unknown Is this a behavioral health or substance abuse call? ---No Guidelines Guideline Title Affirmed Question Affirmed Notes Nurse Date/Time  (Eastern Time) Constipation Abdomen is more swollen than usual Hassel Neth 12/22/2019 2:49:47 PM Disp. Time [name] Time) Disposition Final User 12/22/2019 2:44:11 PM Send to Urgent Queue Valene Bors 12/22/2019 2:54:52 PM See PCP within 24 Hours Yes Jimmye Norman, RN, Ailene Ravel PLEASE NOTE: All timestamps contained within this report are represented as [place] Standard Time. CONFIDENTIALTY NOTICE: This fax transmission is intended only for the addressee. It contains information that is legally privileged, confidential or otherwise protected from use or disclosure. If you are not the intended recipient, you are strictly prohibited from reviewing, disclosing, copying using or disseminating any of this information or taking any action in reliance on or regarding this information. If you have received this fax in error, please notify us immediately by telephone so that we can arrange for its return to [place]. Phone: [phone], Toll-Free: [phone], Fax: [phone] Page: 2 of 2 Call Id: [id number] [place] Disagree/Comply Comply Caller Understands Yes PreDisposition Call Pharmacist Care Advice Given Per Guideline SEE PCP WITHIN 24 HOURS: * IF OFFICE WILL BE OPEN: You need to be examined within the next 24 hours. Call your doctor (or NP/PA) when the office opens and make an appointment. GENERAL CONSTIPATION INSTRUCTIONS: CALL BACK IF: * Constant abdominal pain lasting over 2 hours * Vomiting occurs * You become worse. CARE ADVICE given per Constipation (Adult) guideline. Comments User: Tivis Ringer, RN Date/Time [name] Time): 12/22/2019 2:54:30 PM Patient transfered to office for appointment tomorrow morning User: Tivis Ringer, RN Date/Time [name] Time): 12/22/2019 2:56:15 PM Caller states she had a BM after she took her fiber mediations. Having slight abdominal pain on left abdomen Referrals Warm transfer to backline

## 2019-12-22 NOTE — Telephone Encounter (Signed)
Per access nurse note access nurse was aware that pts abd was distended. pts daughter also called and is aware pt is feeling some better since BM and pt has appt  12/23/19 with Dr Diona Browner. UC & ED precautions given and pts daughter voice understanding and if no additional symptoms pt will keep appt with Dr Diona Browner on 12/23/19.

## 2019-12-22 NOTE — Telephone Encounter (Signed)
Per Shirlean Mylar pt was triaged by access nurse and was advised to be seen in 24 hrs. Pt already has appt to see Dr Diona Browner on 12/23/19 at 10:40 for lt side stomach pain. Shirlean Mylar said that pt mentioned that her abd is distended; do not have faxed or in portal access nurse note and do not know if access nurse was aware pts abd is distended; I went on line but call had been disconnected, I called pts home # several times and line remains busy. I left v/m for [name] (DPR signed) to call [facility] for additional info.

## 2019-12-23 ENCOUNTER — Other Ambulatory Visit: Payer: Self-pay

## 2019-12-23 ENCOUNTER — Ambulatory Visit (INDEPENDENT_AMBULATORY_CARE_PROVIDER_SITE_OTHER): Payer: Medicare Other | Admitting: Family Medicine

## 2019-12-23 ENCOUNTER — Encounter: Payer: Self-pay | Admitting: Family Medicine

## 2019-12-23 VITALS — BP 118/68 | HR 74 | Temp 97.5°F | Ht 63.0 in | Wt 119.2 lb

## 2019-12-23 DIAGNOSIS — G8929 Other chronic pain: Secondary | ICD-10-CM | POA: Diagnosis not present

## 2019-12-23 DIAGNOSIS — R1012 Left upper quadrant pain: Secondary | ICD-10-CM | POA: Diagnosis not present

## 2019-12-23 DIAGNOSIS — K5909 Other constipation: Secondary | ICD-10-CM | POA: Diagnosis not present

## 2019-12-23 NOTE — Progress Notes (Signed)
Chief Complaint  Patient presents with  . Abdominal Pain    left sided, c/o abdominal distention.    History of Present Illness: HPI    86 year old female with history of chronic constipation presents with new onset left upper quadrant abdominal pain and abdominal distention.  She is almost done UTI treatment, started 8/24 Keflex. Urinary symptoms ae improved.  She noted the pain start yesterday at lunchtime before she ate. Tomato soup and half peach. Last BM had been day before. Took miralax after abdominal pain. Then had BM.Marland Kitchen normal size BM.   Pain improved after BM... but still sore in left upper quadrant... she feels this soreness has been present off and on for years.   No blood in stool. No fever.  No CP, No SOB.   CT abd pelvis: 03/2019: IMPRESSION: 1. No evidence of significant acute traumatic injury in the abdomen or pelvis. 2. No acute findings to account for the patient's symptoms. 3. Colonic diverticulosis without evidence to suggest an acute diverticulitis at this time. However, there is a very thick-walled large diverticulum in the mid sigmoid colon, as detailed above. The possibility of colonic neoplasm should be considered, and consideration for follow-up nonemergent colonoscopy is suggested if clinically appropriate given the patient's age and comorbidities.    AGAIN today, Discussed further eval of the thickening...  In past offered GI referral in past.. myself, daughter, patient agreed colonoscopy was too high risk. She again reports not wanting to look in to thickening with further evaluation.   This visit occurred during the SARS-CoV-2 public health emergency.  Safety protocols were in place, including screening questions prior to the visit, additional usage of staff PPE, and extensive cleaning of exam room while observing appropriate contact time as indicated for disinfecting solutions.   COVID 19 screen:  No recent travel or known exposure to  COVID19 The patient denies respiratory symptoms of COVID 19 at this time. The importance of social distancing was discussed today.     Review of Systems  Constitutional: Negative for chills and fever.  HENT: Negative for congestion and ear pain.   Eyes: Negative for pain and redness.  Respiratory: Negative for cough and shortness of breath.   Cardiovascular: Negative for chest pain, palpitations and leg swelling.  Gastrointestinal: Positive for abdominal pain and constipation. Negative for blood in stool, diarrhea, nausea and vomiting.  Genitourinary: Negative for dysuria.  Musculoskeletal: Negative for falls and myalgias.  Skin: Negative for rash.  Neurological: Negative for dizziness.  Psychiatric/Behavioral: Negative for depression. The patient is not nervous/anxious.       Past Medical History:  Diagnosis Date  . Acute pharyngitis   . Acute sinusitis, unspecified   . Acute upper respiratory infections of unspecified site   . Allergy   . Anal and rectal polyp   . Cataract    History of  . Cavernous angioma   . Degenerative cervical disc 02/2006  . Diabetes mellitus    Type II  . GERD (gastroesophageal reflux disease)   . Gout   . Hemangioma of unspecified site   . Hyperlipidemia   . Hypertension   . Orthostatic hypotension 04/2006   Hospital  . Osteoarthritis   . Osteopenia   . Other abscess of vulva   . Palpitations   . Syncope and collapse    1 episode    reports that she has quit smoking. She has never used smokeless tobacco. She reports current alcohol use. She reports that she does not  use drugs.   Current Outpatient Medications:  .  acetaminophen (TYLENOL) 325 MG tablet, Take 650 mg by mouth 2 (two) times daily. , Disp: , Rfl:  .  aspirin EC 81 MG tablet, Take 81 mg by mouth at bedtime. , Disp: , Rfl:  .  Blood Glucose Monitoring Suppl (ACCU-CHEK GUIDE) w/Device KIT, 1 each by Does not apply route daily. Use to check blood sugar daily as needed.  Dx:  E11.9, Disp: 1 kit, Rfl: 0 .  cephALEXin (KEFLEX) 500 MG capsule, Take 1 capsule (500 mg total) by mouth 3 (three) times daily., Disp: 21 capsule, Rfl: 0 .  Fiber POWD, Take by mouth See admin instructions. Mix 1 rounded teaspoonful into 6-8 ounces of water and drink three times a day, Disp: , Rfl:  .  fluticasone (FLONASE) 50 MCG/ACT nasal spray, Place 2 sprays into both nostrils daily as needed for allergies., Disp: 16 g, Rfl: 3 .  glucose blood (ACCU-CHEK AVIVA PLUS) test strip, Use to check blood sugar 1 time daily, Disp: 100 strip, Rfl: 3 .  Lancets (FREESTYLE) lancets, CHECK BLOOD SUGAR 1 TIME A DAY. DX: E11.9 NON INSULIN DEPENDENT, Disp: 100 each, Rfl: 3 .  Multiple Vitamins-Minerals (ONE-A-DAY WOMENS 50+ ADVANTAGE) TABS, Take 1 tablet by mouth daily with breakfast., Disp: , Rfl:  .  polyethylene glycol (MIRALAX / GLYCOLAX) packet, Take 17 g by mouth daily as needed for mild constipation (MIX AND DRINK). , Disp: , Rfl:  .  Propylene Glycol (SYSTANE BALANCE) 0.6 % SOLN, Place 1-2 drops into both eyes daily., Disp: , Rfl:  .  ramipril (ALTACE) 5 MG capsule, TAKE 1 CAPSULE BY MOUTH DAILY, Disp: 90 capsule, Rfl: 1 .  ketoconazole (NIZORAL) 2 % shampoo, Apply 1 application topically 2 (two) times a week. (Patient not taking: Reported on 12/23/2019), Disp: 120 mL, Rfl: 2 .  loratadine (CLARITIN) 10 MG tablet, Take 10 mg by mouth daily. (Patient not taking: Reported on 12/23/2019), Disp: , Rfl:    Observations/Objective: Blood pressure 118/68, pulse 74, temperature (!) 97.5 F (36.4 C), temperature source Temporal, height 5' 3"  (1.6 m), weight 119 lb 4 oz (54.1 kg), SpO2 98 %.  Physical Exam Constitutional:      General: She is not in acute distress.    Appearance: Normal appearance. She is well-developed. She is not ill-appearing or toxic-appearing.     Comments: Elderly in NAD  HENT:     Head: Normocephalic.     Right Ear: Hearing, tympanic membrane, ear canal and external ear normal.  Tympanic membrane is not erythematous, retracted or bulging.     Left Ear: Hearing, tympanic membrane, ear canal and external ear normal. Tympanic membrane is not erythematous, retracted or bulging.     Nose: No mucosal edema or rhinorrhea.     Right Sinus: No maxillary sinus tenderness or frontal sinus tenderness.     Left Sinus: No maxillary sinus tenderness or frontal sinus tenderness.     Mouth/Throat:     Pharynx: Uvula midline.  Eyes:     General: Lids are normal. Lids are everted, no foreign bodies appreciated.     Conjunctiva/sclera: Conjunctivae normal.     Pupils: Pupils are equal, round, and reactive to light.  Neck:     Thyroid: No thyroid mass or thyromegaly.     Vascular: No carotid bruit.     Trachea: Trachea normal.  Cardiovascular:     Rate and Rhythm: Normal rate and regular rhythm.     Pulses: Normal  pulses.     Heart sounds: Normal heart sounds, S1 normal and S2 normal. No murmur heard.  No friction rub. No gallop.   Pulmonary:     Effort: Pulmonary effort is normal. No tachypnea or respiratory distress.     Breath sounds: Normal breath sounds. No decreased breath sounds, wheezing, rhonchi or rales.  Abdominal:     General: Bowel sounds are normal.     Palpations: Abdomen is soft.     Tenderness: There is no abdominal tenderness.  Musculoskeletal:     Cervical back: Normal range of motion and neck supple.  Skin:    General: Skin is warm and dry.     Findings: No rash.  Neurological:     Mental Status: She is alert.  Psychiatric:        Mood and Affect: Mood is not anxious or depressed.        Speech: Speech normal.        Behavior: Behavior normal. Behavior is cooperative.        Thought Content: Thought content normal.        Judgment: Judgment normal.      Assessment and Plan Constipation, chronic Discussed fiber, water and miralax prn.  Chronic left upper quadrant pain  AGAIN today, Discussed further eval of the thickening...  In past offered GI  referral in past.. myself, daughter, patient agreed colonoscopy was too high risk. She again reports not wanting to look in to thickening with further evaluation.   Pain she is having intermittently may be due to this versus constipation.. currently treating with symptomatic care.       Eliezer Lofts, MD

## 2019-12-23 NOTE — Telephone Encounter (Signed)
Ms. Carrillo did not wish to cancel appointment.

## 2019-12-23 NOTE — Assessment & Plan Note (Signed)
AGAIN today, Discussed further eval of the thickening...  In past offered GI referral in past.. myself, daughter, patient agreed colonoscopy was too high risk. She again reports not wanting to look in to thickening with further evaluation.   Pain she is having intermittently may be due to this versus constipation.. currently treating with symptomatic care.

## 2019-12-23 NOTE — Telephone Encounter (Signed)
If symptoms resolved after BM.. can cancel appt.

## 2019-12-23 NOTE — Assessment & Plan Note (Signed)
Discussed fiber, water and miralax prn.

## 2019-12-23 NOTE — Patient Instructions (Addendum)
Keep up  With water and fiber in diet. Can take fiber supplement.  Use mirlax daily to as needed.  Complete keflex course for urinary  Tract infection.

## 2020-01-05 NOTE — Assessment & Plan Note (Signed)
For arthritis pain can use tylenol extras strength 650 mg  2 times daily for pain. If that does not help you can use diclofenac gel/cream over the counter

## 2020-01-05 NOTE — Assessment & Plan Note (Signed)
Can use triamcinolone cream on right lower leg twice daily for  Rash.. no longer than 2 weeks.

## 2020-01-21 ENCOUNTER — Telehealth: Payer: Self-pay | Admitting: Family Medicine

## 2020-01-21 DIAGNOSIS — M25511 Pain in right shoulder: Secondary | ICD-10-CM

## 2020-01-21 NOTE — Telephone Encounter (Signed)
Ms. Kastens notified by telephone that Dr. Diona Browner has placed a referral to orthopedics and our referral coordinators  will be in touch with her once they have her appointment set up.  FYI to [employer].

## 2020-01-21 NOTE — Telephone Encounter (Signed)
Patient called. Patient has been having pain in both shoulders, but her left shoulder is worse.  Patient said she discussed it with Dr.Bedsole and she said she could refer her to someone. Patient has been taking Tylenol, but it doesn't help with the pain.  Patient would like to go to [place].  Patient can go anytime depending on transportation.

## 2020-01-21 NOTE — Telephone Encounter (Signed)
Referral sent 

## 2020-01-23 DIAGNOSIS — M19011 Primary osteoarthritis, right shoulder: Secondary | ICD-10-CM | POA: Diagnosis not present

## 2020-01-23 DIAGNOSIS — M19012 Primary osteoarthritis, left shoulder: Secondary | ICD-10-CM | POA: Diagnosis not present

## 2020-01-27 ENCOUNTER — Other Ambulatory Visit: Payer: Self-pay

## 2020-01-27 ENCOUNTER — Ambulatory Visit (INDEPENDENT_AMBULATORY_CARE_PROVIDER_SITE_OTHER): Payer: Medicare Other | Admitting: Podiatry

## 2020-01-27 ENCOUNTER — Encounter: Payer: Self-pay | Admitting: Podiatry

## 2020-01-27 DIAGNOSIS — B351 Tinea unguium: Secondary | ICD-10-CM

## 2020-01-27 DIAGNOSIS — L84 Corns and callosities: Secondary | ICD-10-CM

## 2020-01-27 DIAGNOSIS — M79674 Pain in right toe(s): Secondary | ICD-10-CM

## 2020-01-27 DIAGNOSIS — E119 Type 2 diabetes mellitus without complications: Secondary | ICD-10-CM

## 2020-01-27 DIAGNOSIS — M79675 Pain in left toe(s): Secondary | ICD-10-CM

## 2020-01-27 DIAGNOSIS — M2041 Other hammer toe(s) (acquired), right foot: Secondary | ICD-10-CM

## 2020-01-27 DIAGNOSIS — M2042 Other hammer toe(s) (acquired), left foot: Secondary | ICD-10-CM

## 2020-01-28 ENCOUNTER — Emergency Department (HOSPITAL_COMMUNITY): Payer: Medicare Other

## 2020-01-28 ENCOUNTER — Emergency Department (HOSPITAL_COMMUNITY)
Admission: EM | Admit: 2020-01-28 | Discharge: 2020-01-28 | Disposition: A | Discharge status: Home / Self Care | Payer: Medicare Other | Attending: Emergency Medicine | Admitting: Emergency Medicine

## 2020-01-28 DIAGNOSIS — Z7982 Long term (current) use of aspirin: Secondary | ICD-10-CM | POA: Insufficient documentation

## 2020-01-28 DIAGNOSIS — I10 Essential (primary) hypertension: Secondary | ICD-10-CM | POA: Diagnosis not present

## 2020-01-28 DIAGNOSIS — R079 Chest pain, unspecified: Secondary | ICD-10-CM | POA: Diagnosis not present

## 2020-01-28 DIAGNOSIS — Z96651 Presence of right artificial knee joint: Secondary | ICD-10-CM | POA: Diagnosis not present

## 2020-01-28 DIAGNOSIS — E119 Type 2 diabetes mellitus without complications: Secondary | ICD-10-CM | POA: Insufficient documentation

## 2020-01-28 DIAGNOSIS — Z87891 Personal history of nicotine dependence: Secondary | ICD-10-CM | POA: Diagnosis not present

## 2020-01-28 DIAGNOSIS — R0789 Other chest pain: Secondary | ICD-10-CM | POA: Diagnosis not present

## 2020-01-28 DIAGNOSIS — R072 Precordial pain: Secondary | ICD-10-CM | POA: Diagnosis not present

## 2020-01-28 DIAGNOSIS — Z79899 Other long term (current) drug therapy: Secondary | ICD-10-CM | POA: Insufficient documentation

## 2020-01-28 DIAGNOSIS — R0902 Hypoxemia: Secondary | ICD-10-CM | POA: Diagnosis not present

## 2020-01-28 LAB — COMPREHENSIVE METABOLIC PANEL
ALT: 17 U/L (ref 0–44)
AST: 17 U/L (ref 15–41)
Albumin: 3.5 g/dL (ref 3.5–5.0)
Alkaline Phosphatase: 50 U/L (ref 38–126)
Anion gap: 10 (ref 5–15)
BUN: 39 mg/dL — ABNORMAL HIGH (ref 8–23)
CO2: 22 mmol/L (ref 22–32)
Calcium: 9.5 mg/dL (ref 8.9–10.3)
Chloride: 102 mmol/L (ref 98–111)
Creatinine, Ser: 1.2 mg/dL — ABNORMAL HIGH (ref 0.44–1.00)
GFR calc non Af Amer: 38 mL/min — ABNORMAL LOW (ref >60)
Glucose, Bld: 83 mg/dL (ref 70–99)
Potassium: 4.4 mmol/L (ref 3.5–5.1)
Sodium: 134 mmol/L — ABNORMAL LOW (ref 135–145)
Total Bilirubin: 0.5 mg/dL (ref 0.3–1.2)
Total Protein: 6.7 g/dL (ref 6.5–8.1)

## 2020-01-28 LAB — CBC WITH DIFFERENTIAL/PLATELET
Abs Immature Granulocytes: 0.02 10*3/uL (ref 0.00–0.07)
Basophils Absolute: 0 10*3/uL (ref 0.0–0.1)
Basophils Relative: 0 %
Eosinophils Absolute: 0.2 10*3/uL (ref 0.0–0.5)
Eosinophils Relative: 2 %
HCT: 36.2 % (ref 36.0–46.0)
Hemoglobin: 11.5 g/dL — ABNORMAL LOW (ref 12.0–15.0)
Immature Granulocytes: 0 %
Lymphocytes Relative: 15 %
Lymphs Abs: 1.7 10*3/uL (ref 0.7–4.0)
MCH: 31.2 pg (ref 26.0–34.0)
MCHC: 31.8 g/dL (ref 30.0–36.0)
MCV: 98.1 fL (ref 80.0–100.0)
Monocytes Absolute: 0.9 10*3/uL (ref 0.1–1.0)
Monocytes Relative: 8 %
Neutro Abs: 8.6 10*3/uL — ABNORMAL HIGH (ref 1.7–7.7)
Neutrophils Relative %: 75 %
Platelets: 281 10*3/uL (ref 150–400)
RBC: 3.69 MIL/uL — ABNORMAL LOW (ref 3.87–5.11)
RDW: 13.2 % (ref 11.5–15.5)
WBC: 11.4 10*3/uL — ABNORMAL HIGH (ref 4.0–10.5)
nRBC: 0 % (ref 0.0–0.2)

## 2020-01-28 LAB — TROPONIN I (HIGH SENSITIVITY)
Troponin I (High Sensitivity): 7 ng/L (ref <18)
Troponin I (High Sensitivity): 7 ng/L (ref <18)

## 2020-01-28 LAB — LIPASE, BLOOD: Lipase: 83 U/L — ABNORMAL HIGH (ref 11–51)

## 2020-01-28 NOTE — ED Triage Notes (Signed)
Pt bib ems from home after sudden onset CP today while she was reading a book. Pt took 4 asa prior to ems arrival and pain has since resolved. 20g LFA. VSS with EMS.

## 2020-01-28 NOTE — ED Provider Notes (Signed)
[facility] EMERGENCY DEPARTMENT Provider Note   CSN: [id number] Arrival date & time: 01/28/20  1737     History Chief Complaint  Patient presents with  . Chest Pain    [name] is a 86 y.o. female.  She said she was sitting reading a book when she experienced some central chest pain.  She said it radiated to the left and the right and lasted 30 minutes.  She took 4 baby aspirin.  The pain had resolved by the time EMS got there.  She said she has had this pain before but it usually only lasts less than 10 minutes.  It was not associated with any nausea vomiting diaphoresis or shortness of breath.  She said she had a little bit of a bad taste in her mouth.  The history is provided by the patient.  Chest Pain Pain location:  Substernal area Pain radiates to:  Does not radiate Pain severity:  Moderate Onset quality:  Sudden Duration:  30 minutes Progression:  Resolved Context: at rest   Relieved by:  Aspirin Worsened by:  Nothing Ineffective treatments:  None tried Associated symptoms: fatigue   Associated symptoms: no abdominal pain, no back pain, no cough, no diaphoresis, no fever, no headache, no lower extremity edema, no nausea, no shortness of breath and no vomiting   Risk factors: diabetes mellitus, high cholesterol and hypertension     HPI: A 86 year old patient with a history of treated diabetes, hypertension and hypercholesterolemia presents for evaluation of chest pain. Initial onset of pain was approximately 1-3 hours ago. The patient's chest pain is described as heaviness/pressure/tightness and is not worse with exertion. The patient's chest pain is middle- or left-sided, is not well-localized, is not sharp and does not radiate to the arms/jaw/neck. The patient does not complain of nausea and denies diaphoresis. The patient has no history of stroke, has no history of peripheral artery disease, has not smoked in the past 90 days, has no relevant  family history of coronary artery disease (first degree relative at less than age 30) and does not have an elevated BMI (>=30).   Past Medical History:  Diagnosis Date  . Acute pharyngitis   . Acute sinusitis, unspecified   . Acute upper respiratory infections of unspecified site   . Allergy   . Anal and rectal polyp   . Cataract    History of  . Cavernous angioma   . Degenerative cervical disc 02/2006  . Diabetes mellitus    Type II  . GERD (gastroesophageal reflux disease)   . Gout   . Hemangioma of unspecified site   . Hyperlipidemia   . Hypertension   . Orthostatic hypotension 04/2006   Hospital  . Osteoarthritis   . Osteopenia   . Other abscess of vulva   . Palpitations   . Syncope and collapse    1 episode    Patient Active Problem List   Diagnosis Date Noted  . Urinary frequency 12/16/2019  . Cough, persistent 11/06/2019  . Acute neck pain 09/26/2019  . Seborrheic dermatitis of scalp 09/26/2019  . Nocturia more than twice per night 08/22/2019  . Seborrheic keratoses 10/16/2017  . Hyponatremia 09/14/2017  . Chronic left upper quadrant pain 08/07/2017  . Right hip pain 07/12/2017  . Hearing loss 02/20/2017  . TIA (transient ischemic attack), possible 11/14/2016  . Left carpal tunnel syndrome 08/11/2016  . Fatigue 06/15/2016  . Constipation, chronic 03/21/2016  . Near syncope 05/07/2015  . Osteoarthritis  of right shoulder 05/04/2015  . Counseling regarding end of life decision making 01/15/2015  . Skin lesion of right leg 07/10/2014  . Insomnia 04/10/2014  . Osteoarthritis of right knee 10/14/2013  . Osteoarthritis of left knee 10/14/2013  . Infected sebaceous cyst 11/29/2012  . Gout, chronic, without tophus 12/13/2009  . Osteoporosis 10/29/2009  . PALPITATIONS, OCCASIONAL 07/07/2008  . Allergic rhinitis 06/02/2008  . HEMANGIOMA 07/30/2006  . Controlled type 2 diabetes mellitus without complication, without long-term current use of insulin ([place])  07/30/2006  . Hyperlipidemia LDL goal <100 07/30/2006  . Essential hypertension, benign 07/30/2006  . GERD 07/30/2006  . RECTAL POLYPS 07/30/2006  . Osteoarthritis 07/30/2006  . CATARACT, HX OF 07/30/2006    Past Surgical History:  Procedure Laterality Date  . ABDOMINAL HYSTERECTOMY    . Aortic dopplers     Mild plaque.  EEG okay  . CATARACT EXTRACTION    . Cavernous angioma    . Cavernous hemangioma  04/2004   Hospital  . [place]     Right     OB History   No obstetric history on file.     Family History  Problem Relation Age of Onset  . Heart disease Other        CHF  . Cancer Maternal Aunt        Breast CA    Social History   Tobacco Use  . Smoking status: Former Research scientist (life sciences)  . Smokeless tobacco: Never Used  . Tobacco comment: quit 1970  Vaping Use  . Vaping Use: Never used  Substance Use Topics  . Alcohol use: Yes    Alcohol/week: 0.0 standard drinks    Comment: wine rarely  . Drug use: No    Home Medications Prior to Admission medications   Medication Sig Start Date End Date Taking? Authorizing Provider  acetaminophen (TYLENOL) 325 MG tablet Take 650 mg by mouth 2 (two) times daily.     [provider]  aspirin EC 81 MG tablet Take 81 mg by mouth at bedtime.     [provider]  Blood Glucose Monitoring Suppl (ACCU-CHEK GUIDE) w/Device KIT 1 each by Does not apply route daily. Use to check blood sugar daily as needed.  Dx: E11.9 04/30/18   Bedsole, Amy E, MD  cephALEXin (KEFLEX) 500 MG capsule Take 1 capsule (500 mg total) by mouth 3 (three) times daily. 12/16/19   [name], MD  Fiber POWD Take by mouth See admin instructions. Mix 1 rounded teaspoonful into 6-8 ounces of water and drink three times a day    [provider]  fluticasone (FLONASE) 50 MCG/ACT nasal spray Place 2 sprays into both nostrils daily as needed for allergies. 03/31/19   Bedsole, Amy E, MD  glucose blood (ACCU-CHEK AVIVA PLUS) test strip Use  to check blood sugar 1 time daily 11/11/19   Bedsole, Amy E, MD  ketoconazole (NIZORAL) 2 % shampoo Apply 1 application topically 2 (two) times a week. 09/29/19   Bedsole, Amy E, MD  Lancets (FREESTYLE) lancets CHECK BLOOD SUGAR 1 TIME A DAY. DX: E11.9 NON INSULIN DEPENDENT 11/11/19   Bedsole, Amy E, MD  loratadine (CLARITIN) 10 MG tablet Take 10 mg by mouth daily.     [provider]  Multiple Vitamins-Minerals (ONE-A-DAY WOMENS 50+ ADVANTAGE) TABS Take 1 tablet by mouth daily with breakfast.    [provider]  polyethylene glycol (MIRALAX / GLYCOLAX) packet Take 17 g by mouth daily as needed for mild  constipation (MIX AND DRINK).     [provider]  Propylene Glycol (SYSTANE BALANCE) 0.6 % SOLN Place 1-2 drops into both eyes daily.    [provider]  ramipril (ALTACE) 5 MG capsule TAKE 1 CAPSULE BY MOUTH DAILY 10/28/19   Diona Browner, Amy E, MD    Allergies    Codeine and Statins  Review of Systems   Review of Systems  Constitutional: Positive for fatigue. Negative for diaphoresis and fever.  HENT: Negative for sore throat.   Eyes: Negative for pain.  Respiratory: Negative for cough and shortness of breath.   Cardiovascular: Positive for chest pain.  Gastrointestinal: Negative for abdominal pain, nausea and vomiting.  Genitourinary: Negative for dysuria.  Musculoskeletal: Negative for back pain.  Skin: Negative for rash.  Neurological: Negative for headaches.    Physical Exam Updated Vital Signs BP (!) 143/73 (BP Location: Right Arm)   Pulse 66   Temp 97.9 F (36.6 C) (Oral)   Resp 18   Ht 5' 3" (1.6 m)   Wt 51.3 kg   SpO2 97%   BMI 20.02 kg/m   Physical Exam Vitals and nursing note reviewed.  Constitutional:      General: She is not in acute distress.    Appearance: She is well-developed.  HENT:     Head: Normocephalic and atraumatic.  Eyes:     Conjunctiva/sclera: Conjunctivae normal.  Cardiovascular:     Rate and Rhythm: Normal rate  and regular rhythm.     Heart sounds: Normal heart sounds. No murmur heard.   Pulmonary:     Effort: Pulmonary effort is normal. No respiratory distress.     Breath sounds: Normal breath sounds.  Abdominal:     Palpations: Abdomen is soft.     Tenderness: There is no abdominal tenderness.  Musculoskeletal:        General: Normal range of motion.     Cervical back: Neck supple.     Right lower leg: No tenderness. No edema.     Left lower leg: No tenderness. No edema.  Skin:    General: Skin is warm and dry.     Capillary Refill: Capillary refill takes less than 2 seconds.  Neurological:     General: No focal deficit present.     Mental Status: She is alert.     ED Results / Procedures / Treatments   Labs (all labs ordered are listed, but only abnormal results are displayed) Labs Reviewed  CBC WITH DIFFERENTIAL/PLATELET - Abnormal; Notable for the following components:      Result Value   WBC 11.4 (*)    RBC 3.69 (*)    Hemoglobin 11.5 (*)    Neutro Abs 8.6 (*)    All other components within normal limits  COMPREHENSIVE METABOLIC PANEL - Abnormal; Notable for the following components:   Sodium 134 (*)    BUN 39 (*)    Creatinine, Ser 1.20 (*)    GFR calc non Af Amer 38 (*)    All other components within normal limits  LIPASE, BLOOD - Abnormal; Notable for the following components:   Lipase 83 (*)    All other components within normal limits  TROPONIN I (HIGH SENSITIVITY)  TROPONIN I (HIGH SENSITIVITY)    EKG EKG Interpretation  Date/Time:  Wednesday January 28 2020 17:45:16 EDT Ventricular Rate:  63 PR Interval:    QRS Duration: 82 QT Interval:  421 QTC Calculation: 431 R Axis:   -47 Text Interpretation: Sinus rhythm  Left ventricular hypertrophy Inferior infarct, old Anterior infarct, old No significant change since prior 1/21 Confirmed by Aletta Edouard [phone]) on 01/28/2020 5:46:21 PM   Radiology DG Chest Port 1 View  Result Date: 01/28/2020 CLINICAL  DATA:  Sudden onset chest pain. EXAM: PORTABLE CHEST 1 VIEW COMPARISON:  Chest x-ray dated Sep 12, 2019. FINDINGS: The heart size and mediastinal contours are within normal limits. Normal pulmonary vascularity. Lungs remain hyperinflated. No focal consolidation, pleural effusion, or pneumothorax. No acute osseous abnormality. IMPRESSION: No active disease. Electronically Signed   By: Titus Dubin M.D.   On: 01/28/2020 18:38    Procedures Procedures (including critical care time)  Medications Ordered in ED Medications - No data to display  ED Course  I have reviewed the triage vital signs and the nursing notes.  Pertinent labs & imaging results that were available during my care of the patient were reviewed by me and considered in my medical decision making (see chart for details).  Clinical Course as of Jan 28 1017  Wed Jan 28, 2020  2048 Reviewed results with patient's daughter after patient gave consent.  Patient's daughter is going to come up here and give her a ride home and stay with her overnight.   [MB]    Clinical Course User Index [MB] Hayden Rasmussen, MD   MDM Rules/Calculators/A&P HEAR Score: 6                       This patient complains of 30 minutes of chest pain at rest; this involves an extensive number of treatment Options and is a complaint that carries with it a high risk of complications and Morbidity. The differential includes ACS, musculoskeletal, GERD, pneumonia, pneumothorax, vascular, metabolic derangement  I ordered, reviewed and interpreted labs, which included mildly elevated white count, hemoglobin slightly lower than baseline, chemistries with mild elevation of BUN and creatinine, LFTs normal, lipase mildly elevated at 83, delta troponins flat Patient had received aspirin by EMS prior to arrival I ordered imaging studies which included chest x-ray and I independently    visualized and interpreted imaging which showed no acute disease Additional  history obtained from patient's daughter and EMS Previous records obtained and reviewed in epic, no recent cardiology visits although does have history of atypical chest pain  After the interventions stated above, I reevaluated the patient and found patient to be asymptomatic.  She has had no further episodes of chest pain.  Cardiac work-up shows no signs of cardiac injury.  Patient asking to go home.  She does have an elevated here score.  Discussed with patient's daughter and she is comfortable taking her home.  Return instructions discussed.   Final Clinical Impression(s) / ED Diagnoses Final diagnoses:  Nonspecific chest pain    Rx / DC Orders ED Discharge Orders    None       Hayden Rasmussen, MD 01/29/20 1022

## 2020-01-28 NOTE — Discharge Instructions (Addendum)
You were seen in the emergency department for chest pain.  You had blood work EKG and a chest x-ray that did not show an obvious explanation for your pain.  Please follow-up with your primary care doctor or cardiologist if you have one.  Return to the emergency department if you have any worsening or concerning symptoms.

## 2020-01-31 NOTE — Progress Notes (Signed)
Subjective:  Patient ID: [name], female    DOB: 1922-07-01,  MRN: [id number]  86 y.o. female presents with preventative diabetic foot care and painful callus(es) right great toe and painful thick toenails that are difficult to trim. Pain interferes with ambulation. Aggravating factors include wearing enclosed shoe gear. Pain is relieved with periodic professional debridement.   She voices no new pedal problems on today's visit.  PCP is Dr. Eliezer Lofts. Last visit was 12/16/2019.  Review of Systems: Negative except as noted in the HPI.  Past Medical History:  Diagnosis Date  . Acute pharyngitis   . Acute sinusitis, unspecified   . Acute upper respiratory infections of unspecified site   . Allergy   . Anal and rectal polyp   . Cataract    History of  . Cavernous angioma   . Degenerative cervical disc 02/2006  . Diabetes mellitus    Type II  . GERD (gastroesophageal reflux disease)   . Gout   . Hemangioma of unspecified site   . Hyperlipidemia   . Hypertension   . Orthostatic hypotension 04/2006   Hospital  . Osteoarthritis   . Osteopenia   . Other abscess of vulva   . Palpitations   . Syncope and collapse    1 episode   Past Surgical History:  Procedure Laterality Date  . ABDOMINAL HYSTERECTOMY    . Aortic dopplers     Mild plaque.  EEG okay  . CATARACT EXTRACTION    . Cavernous angioma    . Cavernous hemangioma  04/2004   Hospital  . TOTAL KNEE ARTHROPLASTY     Right   Patient Active Problem List   Diagnosis Date Noted  . Urinary frequency 12/16/2019  . Cough, persistent 11/06/2019  . Acute neck pain 09/26/2019  . Seborrheic dermatitis of scalp 09/26/2019  . Nocturia more than twice per night 08/22/2019  . Seborrheic keratoses 10/16/2017  . Hyponatremia 09/14/2017  . Chronic left upper quadrant pain 08/07/2017  . Right hip pain 07/12/2017  . Hearing loss 02/20/2017  . TIA (transient ischemic attack), possible 11/14/2016  . Left carpal tunnel  syndrome 08/11/2016  . Fatigue 06/15/2016  . Constipation, chronic 03/21/2016  . Near syncope 05/07/2015  . Osteoarthritis of right shoulder 05/04/2015  . Counseling regarding end of life decision making 01/15/2015  . Skin lesion of right leg 07/10/2014  . Insomnia 04/10/2014  . Osteoarthritis of right knee 10/14/2013  . Osteoarthritis of left knee 10/14/2013  . Infected sebaceous cyst 11/29/2012  . Gout, chronic, without tophus 12/13/2009  . Osteoporosis 10/29/2009  . PALPITATIONS, OCCASIONAL 07/07/2008  . Allergic rhinitis 06/02/2008  . HEMANGIOMA 07/30/2006  . Controlled type 2 diabetes mellitus without complication, without long-term current use of insulin ([place]) 07/30/2006  . Hyperlipidemia LDL goal <100 07/30/2006  . Essential hypertension, benign 07/30/2006  . GERD 07/30/2006  . RECTAL POLYPS 07/30/2006  . Osteoarthritis 07/30/2006  . CATARACT, HX OF 07/30/2006    Current Outpatient Medications:  .  acetaminophen (TYLENOL) 325 MG tablet, Take 650 mg by mouth 2 (two) times daily. , Disp: , Rfl:  .  aspirin EC 81 MG tablet, Take 81 mg by mouth at bedtime. , Disp: , Rfl:  .  Blood Glucose Monitoring Suppl (ACCU-CHEK GUIDE) w/Device KIT, 1 each by Does not apply route daily. Use to check blood sugar daily as needed.  Dx: E11.9, Disp: 1 kit, Rfl: 0 .  cephALEXin (KEFLEX) 500 MG capsule, Take 1 capsule (500 mg total) by mouth  3 (three) times daily., Disp: 21 capsule, Rfl: 0 .  Fiber POWD, Take by mouth See admin instructions. Mix 1 rounded teaspoonful into 6-8 ounces of water and drink three times a day, Disp: , Rfl:  .  fluticasone (FLONASE) 50 MCG/ACT nasal spray, Place 2 sprays into both nostrils daily as needed for allergies., Disp: 16 g, Rfl: 3 .  glucose blood (ACCU-CHEK AVIVA PLUS) test strip, Use to check blood sugar 1 time daily, Disp: 100 strip, Rfl: 3 .  ketoconazole (NIZORAL) 2 % shampoo, Apply 1 application topically 2 (two) times a week., Disp: 120 mL, Rfl: 2 .   Lancets (FREESTYLE) lancets, CHECK BLOOD SUGAR 1 TIME A DAY. DX: E11.9 NON INSULIN DEPENDENT, Disp: 100 each, Rfl: 3 .  loratadine (CLARITIN) 10 MG tablet, Take 10 mg by mouth daily. , Disp: , Rfl:  .  Multiple Vitamins-Minerals (ONE-A-DAY WOMENS 50+ ADVANTAGE) TABS, Take 1 tablet by mouth daily with breakfast., Disp: , Rfl:  .  polyethylene glycol (MIRALAX / GLYCOLAX) packet, Take 17 g by mouth daily as needed for mild constipation (MIX AND DRINK). , Disp: , Rfl:  .  Propylene Glycol (SYSTANE BALANCE) 0.6 % SOLN, Place 1-2 drops into both eyes daily., Disp: , Rfl:  .  ramipril (ALTACE) 5 MG capsule, TAKE 1 CAPSULE BY MOUTH DAILY, Disp: 90 capsule, Rfl: 1 Allergies  Allergen Reactions  . Codeine Nausea And Vomiting  . Statins Other (See Comments)    Pt does not remember the reaction   Social History   Tobacco Use  Smoking Status Former Smoker  Smokeless Tobacco Never Used  Tobacco Comment   quit 1970   Objective:  There were no vitals filed for this visit. Constitutional Patient is a pleasant 86 y.o. Caucasian female, in NAD.Marland Kitchen  Vascular Capillary fill time to digits <3 seconds b/l lower extremities. Palpable pedal pulses b/l LE. Pedal hair absent. Lower extremity skin temperature gradient within normal limits. Capillary refill normal to all digits. No cyanosis or clubbing noted.  Neurologic Normal speech. Oriented to person, place, and time. Protective sensation intact 5/5 intact bilaterally with 10g monofilament b/l. Vibratory sensation intact b/l. Clonus negative b/l.  Dermatologic Pedal skin is thin shiny, atrophic b/l lower extremities. No open wounds bilaterally. No interdigital macerations bilaterally. Toenails 1-5 b/l elongated, discolored, dystrophic, thickened, crumbly with subungual debris and tenderness to dorsal palpation. Hyperkeratotic lesion(s) R hallux.  No erythema, no edema, no drainage, no flocculence.  Orthopedic: Normal muscle strength 5/5 to all lower extremity muscle  groups bilaterally. No pain crepitus or joint limitation noted with ROM b/l. Hallux valgus with bunion deformity noted b/l lower extremities. Hammertoes noted to the 2-5 bilaterally. Utilizes cane for ambulation assistance.   Radiographs: None Assessment:   1. Pain due to onychomycosis of toenails of both feet   2. Callus   3. Acquired hammertoes of both feet   4. Controlled type 2 diabetes mellitus without complication, without long-term current use of insulin ([place])    Plan:  Patient was evaluated and treated and all questions answered.  Onychomycosis with pain -Nails palliatively debridement as below. -Educated on self-care  Procedure: Nail Debridement Rationale: Pain Type of Debridement: manual, sharp debridement. Instrumentation: Nail nipper, rotary burr. Number of Nails: 10  -Examined patient. -No new findings. No new orders. -Continue diabetic foot care principles. -Toenails 1-5 b/l were debrided in length and girth with sterile nail nippers and dremel without iatrogenic bleeding.  -Callus(es) R hallux pared utilizing sterile scalpel blade without complication or  incident. Total number debrided =1. -Patient to report any pedal injuries to medical professional immediately. -Patient to continue soft, supportive shoe gear daily. -Patient/POA to call should there be question/concern in the interim.  Return in about 3 months (around 04/28/2020).  Marzetta Board, DPM

## 2020-02-02 ENCOUNTER — Other Ambulatory Visit: Payer: Self-pay

## 2020-02-02 ENCOUNTER — Ambulatory Visit (INDEPENDENT_AMBULATORY_CARE_PROVIDER_SITE_OTHER): Payer: Medicare Other | Admitting: Family Medicine

## 2020-02-02 VITALS — BP 120/78 | HR 59 | Temp 98.1°F | Wt 115.0 lb

## 2020-02-02 DIAGNOSIS — R1012 Left upper quadrant pain: Secondary | ICD-10-CM

## 2020-02-02 DIAGNOSIS — G8929 Other chronic pain: Secondary | ICD-10-CM | POA: Diagnosis not present

## 2020-02-02 DIAGNOSIS — L989 Disorder of the skin and subcutaneous tissue, unspecified: Secondary | ICD-10-CM | POA: Diagnosis not present

## 2020-02-02 MED ORDER — TRIAMCINOLONE ACETONIDE 0.1 % EX CREA: 1.0000 "application " | TOPICAL_CREAM | Freq: Two times a day (BID) | CUTANEOUS | 0 refills | Status: DC

## 2020-02-02 NOTE — Assessment & Plan Note (Signed)
Reviewed known diverticulosis and CT with thickening and decision to not get GI work-up as discussed with PCP in August. Discussed that given somewhat new pain could get repeat CT to follow vs continue constipation treatment vs consider GI referral to see if non-colonoscopy options could be considered. History is somewhat confusing but does not seem consistent with diverticulitis at this time - though precautions discussed. She will cont miralax and fiber supplement and reach out of worsening or deciding to repeat CT

## 2020-02-02 NOTE — Progress Notes (Signed)
Subjective:     [name] is a 86 y.o. female presenting for Flank Pain (L  off and on x few months )     HPI   #Left abdominal pain - had an ER visit for cp - with normal cardiac work-up - pain off an on for years - was told to f/u after her ER visit  - improved with bowel movement - taking miralax - saw PCP 12/23/2019 - constipation vs thickening noted on CT  #Right LE skin lesion - never tried the treatment recommended by Dr. Diona Browner - present for a long time - did bleed some recently - reviewed PCP note from 10/2019 and recommended triamcinolone trial  Review of Systems   Social History   Tobacco Use  Smoking Status Former Smoker  Smokeless Tobacco Never Used  Tobacco Comment   quit 1970        Objective:    BP Readings from Last 3 Encounters:  02/02/20 120/78  01/28/20 (!) 147/78  12/23/19 118/68   Wt Readings from Last 3 Encounters:  02/02/20 115 lb (52.2 kg)  01/28/20 113 lb (51.3 kg)  12/23/19 119 lb 4 oz (54.1 kg)    BP 120/78   Pulse (!) 59   Temp 98.1 F (36.7 C) (Temporal)   Wt 115 lb (52.2 kg)   SpO2 98%   BMI 20.37 kg/m    Physical Exam Constitutional:      General: She is not in acute distress.    Appearance: She is well-developed. She is not diaphoretic.  HENT:     Right Ear: External ear normal.     Left Ear: External ear normal.  Eyes:     Conjunctiva/sclera: Conjunctivae normal.  Cardiovascular:     Rate and Rhythm: Normal rate and regular rhythm.     Heart sounds: No murmur heard.   Pulmonary:     Effort: Pulmonary effort is normal. No respiratory distress.     Breath sounds: Normal breath sounds. No wheezing.  Abdominal:     General: Abdomen is flat. Bowel sounds are normal. There is no distension.     Tenderness: There is abdominal tenderness (epigastric). There is no guarding or rebound.  Musculoskeletal:     Cervical back: Neck supple.  Skin:    General: Skin is warm and dry.     Capillary Refill:  Capillary refill takes less than 2 seconds.  Neurological:     Mental Status: She is alert. Mental status is at baseline.  Psychiatric:        Mood and Affect: Mood normal.        Behavior: Behavior normal.     04/15/2019 CT abd/pelvis IMPRESSION: 1. No evidence of significant acute traumatic injury in the abdomen or pelvis. 2. No acute findings to account for the patient's symptoms. 3. Colonic diverticulosis without evidence to suggest an acute diverticulitis at this time. However, there is a very thick-walled large diverticulum in the mid sigmoid colon, as detailed above. The possibility of colonic neoplasm should be considered, and consideration for follow-up nonemergent colonoscopy is suggested if clinically appropriate given the patient's age and comorbidities. 4. Aortic atherosclerosis, in addition to least 3 vessel coronary artery disease. 5. Additional incidental findings, as above.      Assessment & Plan:   Problem List Items Addressed This Visit      Musculoskeletal and Integument   Skin lesion of right leg - Primary    Triamcinolone x 2 weeks. Call if not  improved/resolved.       Relevant Medications   triamcinolone cream (KENALOG) 0.1 %     Other   Chronic left upper quadrant pain    Reviewed known diverticulosis and CT with thickening and decision to not get GI work-up as discussed with PCP in August. Discussed that given somewhat new pain could get repeat CT to follow vs continue constipation treatment vs consider GI referral to see if non-colonoscopy options could be considered. History is somewhat confusing but does not seem consistent with diverticulitis at this time - though precautions discussed. She will cont miralax and fiber supplement and reach out of worsening or deciding to repeat CT          Return if symptoms worsen or fail to improve.  Lesleigh Noe, MD  This visit occurred during the SARS-CoV-2 public health emergency.  Safety protocols  were in place, including screening questions prior to the visit, additional usage of staff PPE, and extensive cleaning of exam room while observing appropriate contact time as indicated for disinfecting solutions.

## 2020-02-02 NOTE — Assessment & Plan Note (Signed)
Triamcinolone x 2 weeks. Call if not improved/resolved.

## 2020-02-02 NOTE — Patient Instructions (Addendum)
#  Leg lesion - Use Triamcinolone cream for no more than 2 weeks  #Abdominal pain - Continue miralax  - if pain worsening, fevers, chills, tightness of the belly -- call and can get CT scan - continue to drink 3 bottles of water, try to increase to 4 bottles

## 2020-02-23 ENCOUNTER — Other Ambulatory Visit: Payer: Self-pay

## 2020-02-23 ENCOUNTER — Emergency Department (HOSPITAL_COMMUNITY)
Admission: EM | Admit: 2020-02-23 | Discharge: 2020-02-23 | Disposition: A | Discharge status: Home / Self Care | Payer: Medicare Other | Attending: Emergency Medicine | Admitting: Emergency Medicine

## 2020-02-23 ENCOUNTER — Encounter (HOSPITAL_COMMUNITY): Payer: Self-pay | Admitting: Pediatrics

## 2020-02-23 ENCOUNTER — Emergency Department (HOSPITAL_COMMUNITY): Payer: Medicare Other

## 2020-02-23 DIAGNOSIS — R1032 Left lower quadrant pain: Secondary | ICD-10-CM | POA: Diagnosis not present

## 2020-02-23 DIAGNOSIS — R109 Unspecified abdominal pain: Secondary | ICD-10-CM | POA: Diagnosis not present

## 2020-02-23 DIAGNOSIS — E119 Type 2 diabetes mellitus without complications: Secondary | ICD-10-CM | POA: Diagnosis not present

## 2020-02-23 DIAGNOSIS — Z96651 Presence of right artificial knee joint: Secondary | ICD-10-CM | POA: Insufficient documentation

## 2020-02-23 DIAGNOSIS — K219 Gastro-esophageal reflux disease without esophagitis: Secondary | ICD-10-CM | POA: Insufficient documentation

## 2020-02-23 DIAGNOSIS — N390 Urinary tract infection, site not specified: Secondary | ICD-10-CM

## 2020-02-23 DIAGNOSIS — Z87891 Personal history of nicotine dependence: Secondary | ICD-10-CM | POA: Insufficient documentation

## 2020-02-23 DIAGNOSIS — R42 Dizziness and giddiness: Secondary | ICD-10-CM | POA: Insufficient documentation

## 2020-02-23 DIAGNOSIS — I959 Hypotension, unspecified: Secondary | ICD-10-CM | POA: Diagnosis not present

## 2020-02-23 DIAGNOSIS — I10 Essential (primary) hypertension: Secondary | ICD-10-CM | POA: Diagnosis not present

## 2020-02-23 DIAGNOSIS — R197 Diarrhea, unspecified: Secondary | ICD-10-CM | POA: Diagnosis not present

## 2020-02-23 DIAGNOSIS — R0902 Hypoxemia: Secondary | ICD-10-CM | POA: Diagnosis not present

## 2020-02-23 DIAGNOSIS — I213 ST elevation (STEMI) myocardial infarction of unspecified site: Secondary | ICD-10-CM | POA: Diagnosis not present

## 2020-02-23 LAB — URINALYSIS, ROUTINE W REFLEX MICROSCOPIC
Bilirubin Urine: NEGATIVE
Glucose, UA: NEGATIVE mg/dL
Hgb urine dipstick: NEGATIVE
Ketones, ur: 5 mg/dL — AB
Nitrite: POSITIVE — AB
Protein, ur: NEGATIVE mg/dL
Specific Gravity, Urine: 1.016 (ref 1.005–1.030)
WBC, UA: >50 WBC/hpf — ABNORMAL HIGH (ref 0–5)
pH: 5 (ref 5.0–8.0)

## 2020-02-23 LAB — COMPREHENSIVE METABOLIC PANEL
ALT: 17 U/L (ref 0–44)
AST: 26 U/L (ref 15–41)
Albumin: 3.4 g/dL — ABNORMAL LOW (ref 3.5–5.0)
Alkaline Phosphatase: 43 U/L (ref 38–126)
Anion gap: 10 (ref 5–15)
BUN: 31 mg/dL — ABNORMAL HIGH (ref 8–23)
CO2: 22 mmol/L (ref 22–32)
Calcium: 9.3 mg/dL (ref 8.9–10.3)
Chloride: 96 mmol/L — ABNORMAL LOW (ref 98–111)
Creatinine, Ser: 1.27 mg/dL — ABNORMAL HIGH (ref 0.44–1.00)
GFR, Estimated: 39 mL/min — ABNORMAL LOW (ref >60)
Glucose, Bld: 97 mg/dL (ref 70–99)
Potassium: 4.8 mmol/L (ref 3.5–5.1)
Sodium: 128 mmol/L — ABNORMAL LOW (ref 135–145)
Total Bilirubin: 1 mg/dL (ref 0.3–1.2)
Total Protein: 6.4 g/dL — ABNORMAL LOW (ref 6.5–8.1)

## 2020-02-23 LAB — CBC
HCT: 35.6 % — ABNORMAL LOW (ref 36.0–46.0)
Hemoglobin: 11.8 g/dL — ABNORMAL LOW (ref 12.0–15.0)
MCH: 31.8 pg (ref 26.0–34.0)
MCHC: 33.1 g/dL (ref 30.0–36.0)
MCV: 96 fL (ref 80.0–100.0)
Platelets: 285 10*3/uL (ref 150–400)
RBC: 3.71 MIL/uL — ABNORMAL LOW (ref 3.87–5.11)
RDW: 13.1 % (ref 11.5–15.5)
WBC: 12.4 10*3/uL — ABNORMAL HIGH (ref 4.0–10.5)
nRBC: 0 % (ref 0.0–0.2)

## 2020-02-23 LAB — LIPASE, BLOOD: Lipase: 49 U/L (ref 11–51)

## 2020-02-23 MED ORDER — CEPHALEXIN 500 MG PO CAPS: 500.0000 mg | ORAL_CAPSULE | Freq: Three times a day (TID) | ORAL | 0 refills | Status: DC

## 2020-02-23 MED ORDER — IOHEXOL 300 MG/ML  SOLN
70.0000 mL | Freq: Once | INTRAMUSCULAR | Status: AC | PRN
  Administered 2020-02-23: 70 mL via INTRAVENOUS

## 2020-02-23 MED ORDER — SODIUM CHLORIDE 0.9 % IV BOLUS
500.0000 mL | Freq: Once | INTRAVENOUS | Status: AC
  Administered 2020-02-23: 500 mL via INTRAVENOUS

## 2020-02-23 NOTE — ED Triage Notes (Signed)
Came in via EMS; from home. Stated diarrhea x 2 days and felt weak and dizzy earlier today. EMS endorsed stable VS and given 500 NS bolus.

## 2020-02-23 NOTE — ED Provider Notes (Signed)
[facility] Emergency Department Provider Note MRN:  [id number]  Arrival date & time: 02/23/20     Chief Complaint   Dizziness and Diarrhea   History of Present Illness   [name] is a 86 y.o. year-old female with a history of hypertension, diabetes presenting to the ED with chief complaint of dizziness.  2 days of intermittent diarrhea, 2 or 3 days of abdominal pain worse in the left lower quadrant, today feeling lightheaded, which improves when sitting down.  Denies headache or vision change, no chest pain or shortness of breath, no numbness or weakness to the arms or legs.  Denies any room spinning sensation.  Review of Systems  A complete 10 system review of systems was obtained and all systems are negative except as noted in the HPI and PMH.   Patient's Health History    Past Medical History:  Diagnosis Date  . Acute pharyngitis   . Acute sinusitis, unspecified   . Acute upper respiratory infections of unspecified site   . Allergy   . Anal and rectal polyp   . Cataract    History of  . Cavernous angioma   . Degenerative cervical disc 02/2006  . Diabetes mellitus    Type II  . GERD (gastroesophageal reflux disease)   . Gout   . Hemangioma of unspecified site   . Hyperlipidemia   . Hypertension   . Orthostatic hypotension 04/2006   Hospital  . Osteoarthritis   . Osteopenia   . Other abscess of vulva   . Palpitations   . Syncope and collapse    1 episode    Past Surgical History:  Procedure Laterality Date  . ABDOMINAL HYSTERECTOMY    . Aortic dopplers     Mild plaque.  EEG okay  . CATARACT EXTRACTION    . Cavernous angioma    . Cavernous hemangioma  04/2004   Hospital  . TOTAL KNEE ARTHROPLASTY     Right    Family History  Problem Relation Age of Onset  . Heart disease Other        CHF  . Cancer Maternal Aunt        Breast CA    Social History   Socioeconomic History  . Marital status: Married    Spouse name: Not  on file  . Number of children: 3  . Years of education: Not on file  . Highest education level: Not on file  Occupational History  . 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 Smoking status: Former Research scientist (life sciences)  . Smokeless tobacco: Never Used  . Tobacco comment: quit 1970  Vaping Use  . Vaping Use: Never used  Substance and Sexual Activity  . Alcohol use: Yes    Alcohol/week: 0.0 standard drinks    Comment: wine rarely  . Drug use: No  . Sexual activity: Not on file  Other Topics Concern  . Not on file  Social History Narrative   Diet: fruit and veggies, water, lean protein.   Activity limited secondary to knee discomfort/arthritis, but going to church exersice group two times a week.             Social Determinants of Health   Financial Resource Strain:   . Difficulty of Paying Living Expenses: Not on file  Food Insecurity:   . Worried About Charity fundraiser in the Last Year: Not on file  . Ran  Out of Food in the Last Year: Not on file  Transportation Needs:   . Lack of Transportation (Medical): Not on file  . Lack of Transportation (Non-Medical): Not on file  Physical Activity:   . Days of Exercise per Week: Not on file  . Minutes of Exercise per Session: Not on file  Stress:   . Feeling of Stress : Not on file  Social Connections:   . Frequency of Communication with Friends and Family: Not on file  . Frequency of Social Gatherings with Friends and Family: Not on file  . Attends Religious Services: Not on file  . Active Member of Clubs or Organizations: Not on file  . Attends Archivist Meetings: Not on file  . Marital Status: Not on file  Intimate Partner Violence:   . Fear of Current or Ex-Partner: Not on file  . Emotionally Abused: Not on file  . Physically Abused: Not on file  . Sexually Abused: Not on file     Physical Exam   Vitals:   02/23/20 1515 02/23/20 1530  BP: (!) 146/71 (!)  151/76  Pulse: 76 77  Resp: (!) 35 (!) 23  Temp:    SpO2: 99% 96%    CONSTITUTIONAL: Well-appearing, NAD NEURO:  Alert and oriented x 3, no focal deficits EYES:  eyes equal and reactive ENT/NECK:  no LAD, no JVD CARDIO: Regular rate, well-perfused, normal S1 and S2 PULM:  CTAB no wheezing or rhonchi GI/GU:  normal bowel sounds, non-distended, mild left lower quadrant tenderness to palpation MSK/SPINE:  No gross deformities, no edema SKIN:  no rash, atraumatic PSYCH:  Appropriate speech and behavior  *Additional and/or pertinent findings included in MDM below  Diagnostic and Interventional Summary    EKG Interpretation  Date/Time:  Monday February 23 2020 16:24:29 EDT Ventricular Rate:  73 PR Interval:    QRS Duration: 87 QT Interval:  415 QTC Calculation: 458 R Axis:   -42 Text Interpretation: Sinus rhythm Short PR interval Right atrial enlargement LVH with secondary repolarization abnormality Inferior infarct, age indeterminate Anterior infarct, old Confirmed by Gerlene Fee [phone]) on 02/23/2020 4:28:25 PM      Labs Reviewed  COMPREHENSIVE METABOLIC PANEL - Abnormal; Notable for the following components:      Result Value   Sodium 128 (*)    Chloride 96 (*)    BUN 31 (*)    Creatinine, Ser 1.27 (*)    Total Protein 6.4 (*)    Albumin 3.4 (*)    GFR, Estimated 39 (*)    All other components within normal limits  CBC - Abnormal; Notable for the following components:   WBC 12.4 (*)    RBC 3.71 (*)    Hemoglobin 11.8 (*)    HCT 35.6 (*)    All other components within normal limits  LIPASE, BLOOD  URINALYSIS, ROUTINE W REFLEX MICROSCOPIC    CT ABDOMEN PELVIS W CONTRAST    (Results Pending)    Medications  sodium chloride 0.9 % bolus 500 mL (500 mLs Intravenous New Bag/Given 02/23/20 1547)     Procedures  /  Critical Care Procedures  ED Course and Medical Decision Making  I have reviewed the triage vital signs, the nursing notes, and pertinent available  records from the EMR.  Listed above are laboratory and imaging tests that I personally ordered, reviewed, and interpreted and then considered in my medical decision making (see below for details).  Lightheadedness, diarrhea, abdominal pain, mildly tender on exam.  Reassuring vital signs.  Discussed case with patient's daughter over the phone, this abdominal discomfort has been an issue on and off for a few months.  Will obtain CT abdomen today to exclude diverticulitis.  EKG is overall reassuring, sinus rhythm, labs reveal hyponatremia which seems chronic for her likely in the setting of dehydration and poor appetite.  With a nonacute CT, suspect patient will be appropriate for discharge.  Patient's daughter agreeable with this plan and will stay with patient tonight.  Signed out to default provider at shift change.       Barth Kirks. Sedonia Small, [place] [email or web address]  Final Clinical Impressions(s) / ED Diagnoses     ICD-10-CM   1. Abdominal pain, unspecified abdominal location  R10.9   2. Diarrhea, unspecified type  R19.7   3. Dizziness  R42     ED Discharge Orders    None       Discharge Instructions Discussed with and Provided to Patient:   Discharge Instructions   None       Maudie Flakes, MD 02/23/20 1652

## 2020-02-23 NOTE — ED Provider Notes (Signed)
Patient signed to me by Dr. Maurie Boettcher pending abdominal CT which showed diverticulosis but no-itis.  Does have an infected urine and will be placed on Keflex.   Lacretia Leigh, MD 02/23/20 [phone]

## 2020-02-24 ENCOUNTER — Encounter: Payer: Self-pay | Admitting: Family Medicine

## 2020-02-24 ENCOUNTER — Ambulatory Visit (INDEPENDENT_AMBULATORY_CARE_PROVIDER_SITE_OTHER): Payer: Medicare Other | Admitting: Family Medicine

## 2020-02-24 ENCOUNTER — Other Ambulatory Visit: Payer: Self-pay

## 2020-02-24 VITALS — BP 120/72 | HR 75 | Temp 98.0°F | Ht 63.0 in | Wt 115.5 lb

## 2020-02-24 DIAGNOSIS — E871 Hypo-osmolality and hyponatremia: Secondary | ICD-10-CM | POA: Diagnosis not present

## 2020-02-24 DIAGNOSIS — I10 Essential (primary) hypertension: Secondary | ICD-10-CM | POA: Diagnosis not present

## 2020-02-24 DIAGNOSIS — E119 Type 2 diabetes mellitus without complications: Secondary | ICD-10-CM | POA: Diagnosis not present

## 2020-02-24 DIAGNOSIS — N3000 Acute cystitis without hematuria: Secondary | ICD-10-CM | POA: Diagnosis not present

## 2020-02-24 LAB — POCT GLYCOSYLATED HEMOGLOBIN (HGB A1C): Hemoglobin A1C: 5.6 % (ref 4.0–5.6)

## 2020-02-24 NOTE — Progress Notes (Signed)
Chief Complaint  Patient presents with  . Follow-up    3 month  . Fall    Knee Swelling-Left  . Near Syncope    ER-yesterday-UTI/Dehydration    History of Present Illness: HPI    86 year old female presents for hospital follow up.   Was seen at ER yesterday for dehydration. Had not been feeling well for several days, felt tired for few days prior.  had not been drinking as much liquids. She went after she had dizziness ( not vertigo) and felt presyncopal.  No associated SOB or chest pain.   EKG was unremarkable  CT:  Diverticulosis without diverticulitis. Bilateral renal cyst both parapelvic and cortical. Mild fatty liver. Mild colonic constipation. Dx with UTI: started on ceflexin Sodium was 128. Given IVF  She had a fall.   Today she is feeling better. No dysuria.  Feeling less weak.    This visit occurred during the SARS-CoV-2 public health emergency.  Safety protocols were in place, including screening questions prior to the visit, additional usage of staff PPE, and extensive cleaning of exam room while observing appropriate contact time as indicated for disinfecting solutions.   COVID 19 screen:  No recent travel or known exposure to COVID19 The patient denies respiratory symptoms of COVID 19 at this time. The importance of social distancing was discussed today.     Review of Systems  Constitutional: Negative for chills and fever.  HENT: Negative for congestion and ear pain.   Eyes: Negative for pain and redness.  Respiratory: Negative for cough and shortness of breath.   Cardiovascular: Negative for chest pain, palpitations and leg swelling.  Gastrointestinal: Negative for abdominal pain, blood in stool, constipation, diarrhea, nausea and vomiting.  Genitourinary: Negative for dysuria.  Musculoskeletal: Negative for falls and myalgias.  Skin: Negative for rash.  Neurological: Positive for weakness. Negative for dizziness.  Psychiatric/Behavioral:  Negative for depression. The patient is not nervous/anxious.       Past Medical History:  Diagnosis Date  . Acute pharyngitis   . Acute sinusitis, unspecified   . Acute upper respiratory infections of unspecified site   . Allergy   . Anal and rectal polyp   . Cataract    History of  . Cavernous angioma   . Degenerative cervical disc 02/2006  . Diabetes mellitus    Type II  . GERD (gastroesophageal reflux disease)   . Gout   . Hemangioma of unspecified site   . Hyperlipidemia   . Hypertension   . Orthostatic hypotension 04/2006   Hospital  . Osteoarthritis   . Osteopenia   . Other abscess of vulva   . Palpitations   . Syncope and collapse    1 episode    reports that she has quit smoking. She has never used smokeless tobacco. She reports current alcohol use. She reports that she does not use drugs.   Current Outpatient Medications:  .  acetaminophen (TYLENOL) 325 MG tablet, Take 650 mg by mouth 2 (two) times daily. , Disp: , Rfl:  .  aspirin EC 81 MG tablet, Take 81 mg by mouth at bedtime. , Disp: , Rfl:  .  Blood Glucose Monitoring Suppl (ACCU-CHEK GUIDE) w/Device KIT, 1 each by Does not apply route daily. Use to check blood sugar daily as needed.  Dx: E11.9, Disp: 1 kit, Rfl: 0 .  cephALEXin (KEFLEX) 500 MG capsule, Take 1 capsule (500 mg total) by mouth 3 (three) times daily., Disp: 21 capsule, Rfl: 0 .  Fiber POWD, Take by mouth See admin instructions. Mix 1 rounded teaspoonful into 6-8 ounces of water and drink three times a day, Disp: , Rfl:  .  fluticasone (FLONASE) 50 MCG/ACT nasal spray, Place 2 sprays into both nostrils daily as needed for allergies., Disp: 16 g, Rfl: 3 .  glucose blood (ACCU-CHEK AVIVA PLUS) test strip, Use to check blood sugar 1 time daily, Disp: 100 strip, Rfl: 3 .  ketoconazole (NIZORAL) 2 % shampoo, Apply 1 application topically 2 (two) times a week., Disp: 120 mL, Rfl: 2 .  Lancets (FREESTYLE) lancets, CHECK BLOOD SUGAR 1 TIME A DAY. DX: E11.9  NON INSULIN DEPENDENT, Disp: 100 each, Rfl: 3 .  loratadine (CLARITIN) 10 MG tablet, Take 10 mg by mouth daily. , Disp: , Rfl:  .  Multiple Vitamins-Minerals (ONE-A-DAY WOMENS 50+ ADVANTAGE) TABS, Take 1 tablet by mouth daily with breakfast., Disp: , Rfl:  .  polyethylene glycol (MIRALAX / GLYCOLAX) packet, Take 17 g by mouth daily as needed for mild constipation (MIX AND DRINK). , Disp: , Rfl:  .  Propylene Glycol (SYSTANE BALANCE) 0.6 % SOLN, Place 1-2 drops into both eyes daily., Disp: , Rfl:  .  ramipril (ALTACE) 5 MG capsule, TAKE 1 CAPSULE BY MOUTH DAILY, Disp: 90 capsule, Rfl: 1 .  triamcinolone cream (KENALOG) 0.1 %, Apply 1 application topically 2 (two) times daily., Disp: 30 g, Rfl: 0   Observations/Objective: Blood pressure 120/72, pulse 75, temperature 98 F (36.7 C), temperature source Temporal, height 5' 3"  (1.6 m), weight 115 lb 8 oz (52.4 kg), SpO2 98 %.  Physical Exam Constitutional:      General: She is not in acute distress.Vital signs are normal.     Appearance: Normal appearance. She is well-developed and well-nourished. She is not ill-appearing or toxic-appearing.  HENT:     Head: Normocephalic.     Right Ear: Hearing, tympanic membrane, ear canal and external ear normal. Tympanic membrane is not erythematous, retracted or bulging.     Left Ear: Hearing, tympanic membrane, ear canal and external ear normal. Tympanic membrane is not erythematous, retracted or bulging.     Nose: No mucosal edema or rhinorrhea.     Right Sinus: No maxillary sinus tenderness or frontal sinus tenderness.     Left Sinus: No maxillary sinus tenderness or frontal sinus tenderness.     Mouth/Throat:     Mouth: Oropharynx is clear and moist and mucous membranes are normal.     Pharynx: Uvula midline.  Eyes:     General: Lids are normal. Lids are everted, no foreign bodies appreciated.     Extraocular Movements: EOM normal.     Conjunctiva/sclera: Conjunctivae normal.     Pupils: Pupils are  equal, round, and reactive to light.  Neck:     Thyroid: No thyroid mass or thyromegaly.     Vascular: No carotid bruit.     Trachea: Trachea normal.  Cardiovascular:     Rate and Rhythm: Normal rate and regular rhythm.     Pulses: Normal pulses and intact distal pulses.     Heart sounds: Normal heart sounds, S1 normal and S2 normal. No murmur heard. No friction rub. No gallop.   Pulmonary:     Effort: Pulmonary effort is normal. No tachypnea or respiratory distress.     Breath sounds: Normal breath sounds. No decreased breath sounds, wheezing, rhonchi or rales.  Abdominal:     General: Bowel sounds are normal.     Palpations: Abdomen is  soft.     Tenderness: There is no abdominal tenderness.  Musculoskeletal:     Cervical back: Normal range of motion and neck supple.  Skin:    General: Skin is warm, dry and intact.     Findings: No rash.  Neurological:     Mental Status: She is alert.  Psychiatric:        Mood and Affect: Mood is not anxious or depressed.        Speech: Speech normal.        Behavior: Behavior normal. Behavior is cooperative.        Thought Content: Thought content normal.        Cognition and Memory: Cognition and memory normal.        Judgment: Judgment normal.      Assessment and Plan    Problem List Items Addressed This Visit    Acute cystitis without hematuria    Resolving, complete antibiotics.      Controlled type 2 diabetes mellitus without complication, without long-term current use of insulin (HCC) (Chronic)    Stable, chronic.  Continue current medication.         Relevant Orders   POCT glycosylated hemoglobin (Hb A1C) (Completed)   Essential hypertension, benign   Hyponatremia - Primary    Do not restrict salt in diet.  Keep up with fluids.       Relevant Orders   Basic metabolic panel (Completed)     Orders Placed This Encounter  Procedures  . Basic metabolic panel    Standing Status:   Future    Number of Occurrences:    1    Standing Expiration Date:   06/03/2020    Order Specific Question:   Has the patient fasted?    Answer:   No  . POCT glycosylated hemoglobin (Hb A1C)      Eliezer Lofts, MD

## 2020-02-24 NOTE — Patient Instructions (Addendum)
Do not restrict salt in diet.  Keep up with fluids. Complete antibiotics.

## 2020-03-02 ENCOUNTER — Telehealth: Payer: Self-pay | Admitting: Family Medicine

## 2020-03-02 NOTE — Telephone Encounter (Signed)
Pt called she has questions regarding  cephalexin 500mg  She stated dr Diona Browner prescribed 11/2. Her pharmacy keeps calling to let her know she has more of this rx.  She wants to know what she needs to do.

## 2020-03-02 NOTE — Telephone Encounter (Signed)
Spoke with pharmacy at [facility].  She states for some reason they received that prescription twice.  They will delete the 2nd prescription.  [name] notified of this via telephone.

## 2020-03-03 ENCOUNTER — Other Ambulatory Visit (INDEPENDENT_AMBULATORY_CARE_PROVIDER_SITE_OTHER): Payer: Medicare Other

## 2020-03-03 ENCOUNTER — Ambulatory Visit (INDEPENDENT_AMBULATORY_CARE_PROVIDER_SITE_OTHER): Payer: Medicare Other

## 2020-03-03 ENCOUNTER — Telehealth: Payer: Self-pay | Admitting: Family Medicine

## 2020-03-03 ENCOUNTER — Other Ambulatory Visit: Payer: Self-pay

## 2020-03-03 DIAGNOSIS — Z23 Encounter for immunization: Secondary | ICD-10-CM | POA: Diagnosis not present

## 2020-03-03 DIAGNOSIS — E871 Hypo-osmolality and hyponatremia: Secondary | ICD-10-CM | POA: Diagnosis not present

## 2020-03-03 NOTE — Telephone Encounter (Signed)
-----   Message from Ellamae Sia sent at 02/26/2020 11:46 AM EDT ----- Regarding: Lab orders for Wednesday, 11.10.21 Lab orders, thanks

## 2020-03-04 ENCOUNTER — Ambulatory Visit: Payer: Medicare Other

## 2020-03-04 LAB — BASIC METABOLIC PANEL
BUN: 33 mg/dL — ABNORMAL HIGH (ref 6–23)
CO2: 25 mEq/L (ref 19–32)
Calcium: 9.2 mg/dL (ref 8.4–10.5)
Chloride: 103 mEq/L (ref 96–112)
Creatinine, Ser: 1.15 mg/dL (ref 0.40–1.20)
GFR: 40.59 mL/min — ABNORMAL LOW (ref >60.00)
Glucose, Bld: 103 mg/dL — ABNORMAL HIGH (ref 70–99)
Potassium: 4.4 mEq/L (ref 3.5–5.1)
Sodium: 136 mEq/L (ref 135–145)

## 2020-03-11 DIAGNOSIS — E119 Type 2 diabetes mellitus without complications: Secondary | ICD-10-CM | POA: Diagnosis not present

## 2020-03-11 DIAGNOSIS — H0102B Squamous blepharitis left eye, upper and lower eyelids: Secondary | ICD-10-CM | POA: Diagnosis not present

## 2020-03-11 DIAGNOSIS — H0102A Squamous blepharitis right eye, upper and lower eyelids: Secondary | ICD-10-CM | POA: Diagnosis not present

## 2020-03-11 DIAGNOSIS — H35363 Drusen (degenerative) of macula, bilateral: Secondary | ICD-10-CM | POA: Diagnosis not present

## 2020-03-15 DIAGNOSIS — Z23 Encounter for immunization: Secondary | ICD-10-CM | POA: Diagnosis not present

## 2020-03-22 ENCOUNTER — Ambulatory Visit (INDEPENDENT_AMBULATORY_CARE_PROVIDER_SITE_OTHER): Payer: Medicare Other | Admitting: Family Medicine

## 2020-03-22 ENCOUNTER — Telehealth: Payer: Self-pay

## 2020-03-22 ENCOUNTER — Encounter: Payer: Self-pay | Admitting: Family Medicine

## 2020-03-22 ENCOUNTER — Other Ambulatory Visit: Payer: Self-pay | Admitting: *Deleted

## 2020-03-22 ENCOUNTER — Other Ambulatory Visit: Payer: Self-pay

## 2020-03-22 VITALS — BP 106/58 | HR 88 | Temp 97.2°F | Ht 63.0 in | Wt 117.4 lb

## 2020-03-22 DIAGNOSIS — E119 Type 2 diabetes mellitus without complications: Secondary | ICD-10-CM | POA: Diagnosis not present

## 2020-03-22 DIAGNOSIS — R8290 Unspecified abnormal findings in urine: Secondary | ICD-10-CM

## 2020-03-22 DIAGNOSIS — R350 Frequency of micturition: Secondary | ICD-10-CM | POA: Diagnosis not present

## 2020-03-22 LAB — POC URINALSYSI DIPSTICK (AUTOMATED)
Bilirubin, UA: NEGATIVE
Blood, UA: 25
Glucose, UA: NEGATIVE
Ketones, UA: NEGATIVE
Nitrite, UA: NEGATIVE
Protein, UA: NEGATIVE
Spec Grav, UA: 1.025 (ref 1.010–1.025)
Urobilinogen, UA: 0.2 E.U./dL
pH, UA: 6 (ref 5.0–8.0)

## 2020-03-22 MED ORDER — ACCU-CHEK GUIDE VI STRP
ORAL_STRIP | 3 refills | Status: AC
Start: 2020-03-22 — End: ?

## 2020-03-22 MED ORDER — ACCU-CHEK SOFTCLIX LANCETS MISC: 3 refills | Status: AC

## 2020-03-22 MED ORDER — ACCU-CHEK GUIDE W/DEVICE KIT: 1.0000 | PACK | Freq: Every day | 0 refills | Status: AC

## 2020-03-22 NOTE — Telephone Encounter (Signed)
[facility] - Client TELEPHONE ADVICE RECORD [facility] Patient Name: [name] Gender: Female DOB: [birth year] Age: 86 Y 2 M 12 D Return Phone Number: [phone] (Primary) Address: [address] City/State/Zip: [place] [address] Client [facility] - Client Client Site [facility] - Day Physician Eliezer Lofts - MD Contact Type Call Who Is Calling Patient / Member / Family / Caregiver Call Type Triage / Clinical Relationship To Patient Self Return Phone Number [phone] (Primary) Chief Complaint FAINTING or [place] Reason for Call Symptomatic / Request for [name] states she has been feeling like she was going to faint last couple of days. Caller states she has blood sugar issues and she does not take any medications for it.[name] states her last blood sugar reading was in the 200s. Translation No Nurse Assessment Nurse: Markus Daft, RN, Sherre Poot Date/Time (Eastern Time): 03/22/2020 9:29:30 AM Confirm and document reason for call. If symptomatic, describe symptoms. ---Caller states she has been feeling like she was going to faint last couple of days, but not now. Last felt this way Saturday. Felt this way also about 2 wks ago around 2:30 am, called 911. It was d/t low blood sugar. She improved with eating. Blood sugar reading on Sunday was in the 200s, and w/o meds to treat (for about last 2 years). Fasting blood sugar 89 today. Does the patient have any new or worsening symptoms? ---Yes Will a triage be completed? ---Yes Related visit to physician within the last 2 weeks? ---No Does the PT have any chronic conditions? (i.e. diabetes, asthma, this includes High risk factors for pregnancy, etc.) ---Yes List chronic conditions. ---Diabetic, HTN / heart, seasonal allergies Is this a behavioral health or substance abuse call? ---No Guidelines Guideline  Title Affirmed Question Affirmed Notes Nurse Date/Time (Eastern Time) Diabetes - Low Blood Sugar [1] Blood glucose < 70 mg/dL (3.9 mmol/L) or symptomatic AND [2] cause known Markus Daft, RN, Windy 03/22/2020 9:33:55 AM Disp. Time [name] Time) Disposition Final User 03/22/2020 9:26:31 AM Send to Urgent Henriette Combs PLEASE NOTE: All timestamps contained within this report are represented as [place] Standard Time. CONFIDENTIALTY NOTICE: This fax transmission is intended only for the addressee. It contains information that is legally privileged, confidential or otherwise protected from use or disclosure. If you are not the intended recipient, you are strictly prohibited from reviewing, disclosing, copying using or disseminating any of this information or taking any action in reliance on or regarding this information. If you have received this fax in error, please notify us immediately by telephone so that we can arrange for its return to [place]. Phone: [phone], Toll-Free: [phone], Fax: [phone] Page: 2 of 2 Call Id: [id number] 03/22/2020 9:38:15 AM [place], RN, Sherre Poot Caller Disagree/Comply Comply Caller Understands Yes PreDisposition Go to Urgent Care/Walk-In Clinic Care Advice Given Per Guideline HOME CARE: * You should be able to treat this at home. REASSURANCE AND EDUCATION - LOW BLOOD SUGAR: * It sounds like this is low blood sugar (hypoglycemia) that we can treat at home. * Here is some care advice that should help. LOW BLOOD SUGAR (HYPOGLYCEMIA) - DEFINITION: * Low blood sugar (hypoglycemia) is defined as a blood glucose less than 70 mg/dL (3.9 mmol/L). * Symptoms of mild hypoglycemia: Shakiness, weakness, not thinking clearly, headache, trembling, sweating, dizziness, palpitations, and hunger. * Contributing factors: Too much insulin, delayed meal, insufficient food, strenuous exercise, alcohol. Comments User: Mayford Knife,  RN Date/Time [name] Time):  03/22/2020 9:36:58 AM <70 about 2 wks ago and then this past weekend.

## 2020-03-22 NOTE — Telephone Encounter (Signed)
That is fine occasionally if she does not feel like eating/fixing a meal

## 2020-03-22 NOTE — Assessment & Plan Note (Signed)
Lab Results  Component Value Date   HGBA1C 5.6 02/24/2020   Pt is concerned because glucose has been up and down  One reading of 219  Otherwise readings look normal to occ low  Adv pt NOT to take the metformin due to risk of low glucose  Reviewed dietary habits - recommend protein with each meal and a good fluid intake  Will update if glucose readings go up/stay up

## 2020-03-22 NOTE — Assessment & Plan Note (Signed)
ua shows mod leuk and sm blood-pending cx to treat  Enc a good water intake Only symptom is frequency   inst to let us know if she develops more symptoms

## 2020-03-22 NOTE — Patient Instructions (Addendum)
Eat regular meals (do not skip)  Make sure you get protein with every meal (like meat or eggs or dairy or peanut butter)  I want to prevent low blood sugar   If sugar goes up occasionally that is fine  Do not start the metformin   Leave [place] a urine sample today and we will call you about the result  Try to drink more fluids to keep from getting utis

## 2020-03-22 NOTE — Telephone Encounter (Signed)
Vm from pt left at triage.  Pt states she was seen by Dr. Glori Bickers today.  She wants to know if she can take Glucerna shakes as a good meal supplement.  Please advise at [phone].

## 2020-03-22 NOTE — Progress Notes (Signed)
Subjective:    Patient ID: [name], female    DOB: 01/30/1923, 86 y.o.   MRN: [id number]  This visit occurred during the SARS-CoV-2 public health emergency.  Safety protocols were in place, including screening questions prior to the visit, additional usage of staff PPE, and extensive cleaning of exam room while observing appropriate contact time as indicated for disinfecting solutions.    HPI 86 yo pt of Dr Diona Browner presents with blood sugar problems   Wt Readings from Last 3 Encounters:  03/22/20 117 lb 6 oz (53.2 kg)  02/24/20 115 lb 8 oz (52.4 kg)  02/23/20 117 lb (53.1 kg)   20.79 kg/m   Her daughter requested a UA today  She has frequency of urination - but no other symptoms   Worried about her blood sugar  Lab Results  Component Value Date   HGBA1C 5.6 02/24/2020    H/o diet controlled DM2 Was on metformin in the past  (wonders if she should be on it)   She ate some pie and sugar went up to 219  That was the highest  occ goes low as well  (had to sit down one time and had to call 911 once)  Unsure how low it was  She ate peanut butter and ham sandwich and it came back "to normal"  During holiday weekend ups and downs (cannot tell me what)     She takes ramipril for HTN  BP Readings from Last 3 Encounters:  02/24/20 120/72  02/23/20 132/70  02/02/20 120/78   Pulse Readings from Last 3 Encounters:  02/24/20 75  02/23/20 74  02/02/20 (!) 59    Takes tylenol for joint pain/arthritis   ua today: Results for orders placed or performed in visit on 03/22/20  POCT Urinalysis Dipstick (Automated)  Result Value Ref Range   Color, UA Yellow    Clarity, UA Hazy    Glucose, UA Negative Negative   Bilirubin, UA Negative    Ketones, UA Negative    Spec Grav, UA 1.025 1.010 - 1.025   Blood, UA 25 Ery/uL    pH, UA 6.0 5.0 - 8.0   Protein, UA Negative Negative   Urobilinogen, UA 0.2 0.2 or 1.0 E.U./dL   Nitrite, UA Negative    Leukocytes, UA Moderate  (2+) (A) Negative    Patient Active Problem List   Diagnosis Date Noted  . Urinary frequency 12/16/2019  . Cough, persistent 11/06/2019  . Acute neck pain 09/26/2019  . Seborrheic dermatitis of scalp 09/26/2019  . Nocturia more than twice per night 08/22/2019  . Seborrheic keratoses 10/16/2017  . Hyponatremia 09/14/2017  . Chronic left upper quadrant pain 08/07/2017  . Right hip pain 07/12/2017  . Hearing loss 02/20/2017  . TIA (transient ischemic attack), possible 11/14/2016  . Left carpal tunnel syndrome 08/11/2016  . Fatigue 06/15/2016  . Constipation, chronic 03/21/2016  . Near syncope 05/07/2015  . Osteoarthritis of right shoulder 05/04/2015  . Counseling regarding end of life decision making 01/15/2015  . Skin lesion of right leg 07/10/2014  . Insomnia 04/10/2014  . Osteoarthritis of right knee 10/14/2013  . Osteoarthritis of left knee 10/14/2013  . Infected sebaceous cyst 11/29/2012  . Gout, chronic, without tophus 12/13/2009  . Osteoporosis 10/29/2009  . PALPITATIONS, OCCASIONAL 07/07/2008  . Allergic rhinitis 06/02/2008  . HEMANGIOMA 07/30/2006  . Controlled type 2 diabetes mellitus without complication, without long-term current use of insulin ([place]) 07/30/2006  . Hyperlipidemia LDL goal <100 07/30/2006  .  Essential hypertension, benign 07/30/2006  . GERD 07/30/2006  . RECTAL POLYPS 07/30/2006  . Osteoarthritis 07/30/2006  . CATARACT, HX OF 07/30/2006   Past Medical History:  Diagnosis Date  . Acute pharyngitis   . Acute sinusitis, unspecified   . Acute upper respiratory infections of unspecified site   . Allergy   . Anal and rectal polyp   . Cataract    History of  . Cavernous angioma   . Degenerative cervical disc 02/2006  . Diabetes mellitus    Type II  . GERD (gastroesophageal reflux disease)   . Gout   . Hemangioma of unspecified site   . Hyperlipidemia   . Hypertension   . Orthostatic hypotension 04/2006   Hospital  . Osteoarthritis   .  Osteopenia   . Other abscess of vulva   . Palpitations   . Syncope and collapse    1 episode   Past Surgical History:  Procedure Laterality Date  . ABDOMINAL HYSTERECTOMY    . Aortic dopplers     Mild plaque.  EEG okay  . CATARACT EXTRACTION    . Cavernous angioma    . Cavernous hemangioma  04/2004   Hospital  . TOTAL KNEE ARTHROPLASTY     Right   Social History   Tobacco Use  . Smoking status: Former Research scientist (life sciences)  . Smokeless tobacco: Never Used  . Tobacco comment: quit 1970  Vaping Use  . Vaping Use: Never used  Substance Use Topics  . Alcohol use: Yes    Alcohol/week: 0.0 standard drinks    Comment: wine rarely  . Drug use: No   Family History  Problem Relation Age of Onset  . Heart disease Other        CHF  . Cancer Maternal Aunt        Breast CA   Allergies  Allergen Reactions  . Codeine Nausea And Vomiting  . Statins Other (See Comments)    Pt does not remember the reaction   Current Outpatient Medications on File Prior to Visit  Medication Sig Dispense Refill  . acetaminophen (TYLENOL) 325 MG tablet Take 650 mg by mouth 2 (two) times daily.     Marland Kitchen aspirin EC 81 MG tablet Take 81 mg by mouth at bedtime.     . Fiber POWD Take by mouth See admin instructions. Mix 1 rounded teaspoonful into 6-8 ounces of water and drink three times a day    . fluticasone (FLONASE) 50 MCG/ACT nasal spray Place 2 sprays into both nostrils daily as needed for allergies. 16 g 3  . ketoconazole (NIZORAL) 2 % shampoo Apply 1 application topically 2 (two) times a week. 120 mL 2  . loratadine (CLARITIN) 10 MG tablet Take 10 mg by mouth daily.     . Multiple Vitamins-Minerals (ONE-A-DAY WOMENS 50+ ADVANTAGE) TABS Take 1 tablet by mouth daily with breakfast.    . polyethylene glycol (MIRALAX / GLYCOLAX) packet Take 17 g by mouth daily as needed for mild constipation (MIX AND DRINK).     . Propylene Glycol (SYSTANE BALANCE) 0.6 % SOLN Place 1-2 drops into both eyes daily.    . ramipril  (ALTACE) 5 MG capsule TAKE 1 CAPSULE BY MOUTH DAILY 90 capsule 1  . triamcinolone cream (KENALOG) 0.1 % Apply 1 application topically 2 (two) times daily. 30 g 0   No current facility-administered medications on file prior to visit.    Review of Systems  Constitutional: Negative for activity change, appetite change, fatigue, fever and  unexpected weight change.  HENT: Negative for congestion, ear pain, rhinorrhea, sinus pressure and sore throat.   Eyes: Negative for pain, redness and visual disturbance.  Respiratory: Negative for cough, shortness of breath and wheezing.   Cardiovascular: Negative for chest pain and palpitations.  Gastrointestinal: Negative for abdominal pain, blood in stool, constipation and diarrhea.  Endocrine: Negative for polydipsia, polyphagia and polyuria.  Genitourinary: Positive for frequency. Negative for dysuria, hematuria, pelvic pain and urgency.  Musculoskeletal: Negative for arthralgias, back pain and myalgias.  Skin: Negative for pallor and rash.  Allergic/Immunologic: Negative for environmental allergies.  Neurological: Negative for dizziness, syncope and headaches.  Hematological: Negative for adenopathy. Does not bruise/bleed easily.  Psychiatric/Behavioral: Negative for decreased concentration and dysphoric mood. The patient is not nervous/anxious.        Objective:   Physical Exam Constitutional:      General: She is not in acute distress.    Appearance: Normal appearance. She is normal weight. She is not ill-appearing.  Eyes:     General: No scleral icterus.    Conjunctiva/sclera: Conjunctivae normal.     Pupils: Pupils are equal, round, and reactive to light.  Cardiovascular:     Rate and Rhythm: Normal rate and regular rhythm.  Pulmonary:     Effort: Pulmonary effort is normal. No respiratory distress.     Breath sounds: Normal breath sounds. No wheezing or rales.  Abdominal:     General: Abdomen is flat. Bowel sounds are normal. There is  no distension.     Palpations: Abdomen is soft.     Tenderness: There is no abdominal tenderness. There is no right CVA tenderness, left CVA tenderness, guarding or rebound.  Musculoskeletal:     Cervical back: Normal range of motion and neck supple. No tenderness.     Right lower leg: No edema.     Left lower leg: No edema.  Skin:    General: Skin is warm and dry.     Coloration: Skin is not pale.     Findings: No erythema or rash.  Neurological:     Mental Status: She is alert.  Psychiatric:        Mood and Affect: Mood normal.           Assessment & Plan:   Problem List Items Addressed This Visit      Endocrine   Controlled type 2 diabetes mellitus without complication, without long-term current use of insulin ([place]) - Primary    Lab Results  Component Value Date   HGBA1C 5.6 02/24/2020   Pt is concerned because glucose has been up and down  One reading of 219  Otherwise readings look normal to occ low  Adv pt NOT to take the metformin due to risk of low glucose  Reviewed dietary habits - recommend protein with each meal and a good fluid intake  Will update if glucose readings go up/stay up          Other   Urinary frequency    ua shows mod leuk and sm blood-pending cx to treat  Enc a good water intake Only symptom is frequency   inst to let us know if she develops more symptoms       Relevant Orders   POCT Urinalysis Dipstick (Automated) (Completed)   Urine Culture    Other Visit Diagnoses    Abnormal urinalysis       Relevant Orders   Urine Culture

## 2020-03-22 NOTE — Telephone Encounter (Signed)
[facility] - Client TELEPHONE ADVICE RECORD [facility] Patient Name: [name] Gender: Female DOB: 11/16/1922 Age: 86 Y 2 M 10 D Return Phone Number: [phone] (Primary), [phone] (Secondary) Address[place] [address] Client [facility] - Client Client Site [place] Physician Eliezer Lofts - MD Contact Type Call Who Is Calling Patient / Member / Family / Caregiver Call Type Triage / Clinical Caller Name [name] Relationship To Patient Daughter Return Phone Number [phone] (Primary) Chief Complaint Blood Sugar Low Reason for Call Symptomatic / Request for [place] states her mothers blood sugar was low on Thursday. Today she was feeling funny but her blood sugar was ok. She believes that she may need to have some medication for the diabetes. Translation No Nurse Assessment Nurse: Hassell Done, RN, Joelene Millin Date/Time [name] Time): 03/20/2020 5:03:22 PM Confirm and document reason for call. If symptomatic, describe symptoms. ---caller states she feels like her BS is low and she didn't feel too well. she states she feels like she is going to pass out. Does the patient have any new or worsening symptoms? ---Yes Will a triage be completed? ---Yes Related visit to physician within the last 2 weeks? ---No Does the PT have any chronic conditions? (i.e. diabetes, asthma, this includes High risk factors for pregnancy, etc.) ---Yes List chronic conditions. ---diabetes, allergies Is this a behavioral health or substance abuse call? ---No Guidelines Guideline Title Affirmed Question Affirmed Notes Nurse Date/Time (Eastern Time) Weakness (Generalized) and Fatigue [1] MODERATE weakness (i.e., interferes with work, school, normal activities) AND [2] cause unknown (Exceptions: weakness with acute minor illness, or weakness  from poor fluid intake) Hassell Done, RN, Joelene Millin 03/20/2020 5:09:16 PM Disp. Time [name] Time) Disposition Final User PLEASE NOTE: All timestamps contained within this report are represented as [place] Standard Time. CONFIDENTIALTY NOTICE: This fax transmission is intended only for the addressee. It contains information that is legally privileged, confidential or otherwise protected from use or disclosure. If you are not the intended recipient, you are strictly prohibited from reviewing, disclosing, copying using or disseminating any of this information or taking any action in reliance on or regarding this information. If you have received this fax in error, please notify us immediately by telephone so that we can arrange for its return to [place]. Phone: [phone], Toll-Free: [phone], Fax: [phone] Page: 2 of 2 Call Id: [id number] 03/20/2020 5:14:21 PM See HCP within 4 Hours (or PCP triage) Yes Hassell Done, RN, Renea Ee Disagree/Comply Comply Caller Understands Yes PreDisposition Call Doctor Care Advice Given Per Guideline SEE HCP (OR PCP TRIAGE) WITHIN 4 HOURS: * IF OFFICE WILL BE CLOSED AND NO PCP (PRIMARY CARE PROVIDER) SECOND-LEVEL TRIAGE: You need to be seen within the next 3 or 4 hours. A nearby Urgent Care Center [facility]) is often a good source of care. Another choice is to go to the ED. Go sooner if you become worse. CALL BACK IF: * You become worse CARE ADVICE given per Weakness and Fatigue (Adult) guideline. Referrals [facility] [place]

## 2020-03-23 NOTE — Telephone Encounter (Signed)
Left VM letting pt know Dr. Tower's comments  

## 2020-03-24 LAB — URINE CULTURE
MICRO NUMBER:: 11251999
SPECIMEN QUALITY:: ADEQUATE

## 2020-03-25 ENCOUNTER — Other Ambulatory Visit: Payer: Self-pay | Admitting: *Deleted

## 2020-03-25 MED ORDER — CEPHALEXIN 250 MG PO CAPS: 250.0000 mg | ORAL_CAPSULE | Freq: Two times a day (BID) | ORAL | 0 refills | Status: DC

## 2020-03-25 NOTE — Telephone Encounter (Signed)
Pt notified of Urine cx results and Dr. Marliss Coots comments. Rx sent to pharmacy.   Pt wanted me to send a message to her PCP to see if there is anything that pt can do to prevent getting UTIs pt said this is the 2nd UTI that she has had recently. Pt said she has to wear pads due to a small amount of urinary incontinence so she doesn't know what she can do to prevent these, I advise pt I would send this to PCP and they would f/u up her

## 2020-03-25 NOTE — Telephone Encounter (Signed)
Pt returning the call

## 2020-03-25 NOTE — Telephone Encounter (Signed)
Have her increase water intake. Can try cranberry tablets OTC. If still occurring we can discuss UTI prophylaxis antibiotic daily.

## 2020-03-25 NOTE — Telephone Encounter (Signed)
Left VM requesting pt to call the office back 

## 2020-03-25 NOTE — Telephone Encounter (Signed)
-----   Message from Abner Greenspan, MD sent at 03/24/2020  5:02 PM EST ----- Uti- e coli on culture  Please send in keflex 250 mg 1 po bid for 7d  #14 no ref  She was having urinary frequency

## 2020-03-26 ENCOUNTER — Telehealth: Payer: Self-pay

## 2020-03-26 NOTE — Telephone Encounter (Signed)
Pt left a message on triage line stating a few days ago she had some sneezing. Has a runny nose. Temperature 94.8. Asked what to do.

## 2020-03-26 NOTE — Telephone Encounter (Signed)
Patient is returning your call. Please call her back .EM 

## 2020-03-26 NOTE — Telephone Encounter (Signed)
Pt thought we got disconnected. Temp was 97.7.

## 2020-03-26 NOTE — Telephone Encounter (Signed)
[name] notified as instructed by telephone.  Patient states understanding.

## 2020-03-26 NOTE — Telephone Encounter (Signed)
I called her back to get more information. Right now she has a stuffy/runny nose. No more sneezing. I advised her that her thermometer is not correct if it says 94.8. She is taking Claritin and Flonase. Says she is real tired but is always real tired.

## 2020-03-26 NOTE — Telephone Encounter (Signed)
Can change Claritin to Zyrtec at bedtime... if not improving make an appt for exam.  Go to ER if SOB.

## 2020-03-29 NOTE — Telephone Encounter (Signed)
Ms. [name] notified as instructed by telephone.  Patient states understanding.

## 2020-04-28 ENCOUNTER — Other Ambulatory Visit: Payer: Self-pay

## 2020-04-28 ENCOUNTER — Other Ambulatory Visit: Payer: Self-pay | Admitting: Family Medicine

## 2020-04-28 ENCOUNTER — Ambulatory Visit (INDEPENDENT_AMBULATORY_CARE_PROVIDER_SITE_OTHER): Payer: Medicare Other | Admitting: Podiatry

## 2020-04-28 ENCOUNTER — Encounter: Payer: Self-pay | Admitting: Podiatry

## 2020-04-28 ENCOUNTER — Other Ambulatory Visit: Payer: Self-pay | Admitting: *Deleted

## 2020-04-28 DIAGNOSIS — M79675 Pain in left toe(s): Secondary | ICD-10-CM | POA: Diagnosis not present

## 2020-04-28 DIAGNOSIS — M2042 Other hammer toe(s) (acquired), left foot: Secondary | ICD-10-CM

## 2020-04-28 DIAGNOSIS — B351 Tinea unguium: Secondary | ICD-10-CM

## 2020-04-28 DIAGNOSIS — M79674 Pain in right toe(s): Secondary | ICD-10-CM | POA: Diagnosis not present

## 2020-04-28 DIAGNOSIS — Z86010 Personal history of colonic polyps: Secondary | ICD-10-CM | POA: Insufficient documentation

## 2020-04-28 DIAGNOSIS — L84 Corns and callosities: Secondary | ICD-10-CM

## 2020-04-28 DIAGNOSIS — M2041 Other hammer toe(s) (acquired), right foot: Secondary | ICD-10-CM

## 2020-04-28 DIAGNOSIS — E1142 Type 2 diabetes mellitus with diabetic polyneuropathy: Secondary | ICD-10-CM

## 2020-04-28 NOTE — Telephone Encounter (Signed)
Refill was sent earlier today to [facility] on [address]

## 2020-04-28 NOTE — Telephone Encounter (Signed)
Patient left a voicemail stating that she had changed her Ramipril prescription to [facility] because she is closer to them than [facility]. Patient stated that [facility] charged her a lot of money for the Ramipril. Patient stated that she tried to get the [facility] at [facility] and because it was transferred they do not have that medication on file for her. Patient is requesting that a new script for her Ramipril be [facility] to [facility] so she can start back getting the medication from there.

## 2020-04-29 NOTE — Progress Notes (Signed)
Subjective:  Patient ID: [name], female    DOB: [birth year],  MRN: [id number]  87 y.o. female presents with preventative diabetic foot care and painful callus(es) right great toe and painful thick toenails that are difficult to trim. Pain interferes with ambulation. Aggravating factors include wearing enclosed shoe gear. Pain is relieved with periodic professional debridement.   She voices no new pedal problems on today's visit.  PCP is Dr. Eliezer Lofts. Last visit was 02/24/2020.  Patient is requesting a toe cap on today's visit.  Review of Systems: Negative except as noted in the HPI.  Past Medical History:  Diagnosis Date  . Acute pharyngitis   . Acute sinusitis, unspecified   . Acute upper respiratory infections of unspecified site   . Allergy   . Anal and rectal polyp   . Cataract    History of  . Cavernous angioma   . Degenerative cervical disc 02/2006  . Diabetes mellitus    Type II  . GERD (gastroesophageal reflux disease)   . Gout   . Hemangioma of unspecified site   . Hyperlipidemia   . Hypertension   . Orthostatic hypotension 04/2006   Hospital  . Osteoarthritis   . Osteopenia   . Other abscess of vulva   . Palpitations   . Syncope and collapse    1 episode   Past Surgical History:  Procedure Laterality Date  . ABDOMINAL HYSTERECTOMY    . Aortic dopplers     Mild plaque.  EEG okay  . CATARACT EXTRACTION    . Cavernous angioma    . Cavernous hemangioma  04/2004   Hospital  . TOTAL KNEE ARTHROPLASTY     Right   Patient Active Problem List   Diagnosis Date Noted  . History of colonic polyps 04/28/2020  . Urinary frequency 12/16/2019  . Cough, persistent 11/06/2019  . Acute neck pain 09/26/2019  . Seborrheic dermatitis of scalp 09/26/2019  . Nocturia more than twice per night 08/22/2019  . Seborrheic keratoses 10/16/2017  . Hyponatremia 09/14/2017  . Chronic left upper quadrant pain 08/07/2017  . Right hip pain 07/12/2017  . Hearing loss  02/20/2017  . TIA (transient ischemic attack), possible 11/14/2016  . Left carpal tunnel syndrome 08/11/2016  . Fatigue 06/15/2016  . Constipation, chronic 03/21/2016  . Near syncope 05/07/2015  . Osteoarthritis of right shoulder 05/04/2015  . Counseling regarding end of life decision making 01/15/2015  . Skin lesion of right leg 07/10/2014  . Insomnia 04/10/2014  . Osteoarthritis of right knee 10/14/2013  . Osteoarthritis of left knee 10/14/2013  . Infected sebaceous cyst 11/29/2012  . Gout, chronic, without tophus 12/13/2009  . Osteoporosis 10/29/2009  . PALPITATIONS, OCCASIONAL 07/07/2008  . Allergic rhinitis 06/02/2008  . HEMANGIOMA 07/30/2006  . Controlled type 2 diabetes mellitus without complication, without long-term current use of insulin ([place]) 07/30/2006  . Hyperlipidemia LDL goal <100 07/30/2006  . Essential hypertension, benign 07/30/2006  . GERD 07/30/2006  . RECTAL POLYPS 07/30/2006  . Osteoarthritis 07/30/2006  . CATARACT, HX OF 07/30/2006    Current Outpatient Medications:  .  Accu-Chek Softclix Lancets lancets, CHECK BLOOD SUGAR 1 TIME A DAY. DX: E11.9, Disp: 100 each, Rfl: 3 .  acetaminophen (TYLENOL) 325 MG tablet, Take 650 mg by mouth 2 (two) times daily., Disp: , Rfl:  .  alendronate (FOSAMAX) 70 MG tablet, 1 tablet, Disp: , Rfl:  .  aspirin EC 81 MG tablet, Take 81 mg by mouth at bedtime. , Disp: , Rfl:  .  Blood Glucose Monitoring Suppl (ACCU-CHEK GUIDE) w/Device KIT, 1 each by Does not apply route daily. Use to check blood sugar daily as needed.  Dx: E11.9, Disp: 1 kit, Rfl: 0 .  cephALEXin (KEFLEX) 250 MG capsule, Take 1 capsule (250 mg total) by mouth 2 (two) times daily., Disp: 14 capsule, Rfl: 0 .  Fiber POWD, Take by mouth See admin instructions. Mix 1 rounded teaspoonful into 6-8 ounces of water and drink three times a day, Disp: , Rfl:  .  fluticasone (FLONASE) 50 MCG/ACT nasal spray, Place 2 sprays into both nostrils daily as needed for allergies.,  Disp: 16 g, Rfl: 3 .  glucose blood (ACCU-CHEK GUIDE) test strip, Use to check blood sugar 1 time daily, Disp: 100 each, Rfl: 3 .  ketoconazole (NIZORAL) 2 % shampoo, Apply 1 application topically 2 (two) times a week., Disp: 120 mL, Rfl: 2 .  loratadine (CLARITIN) 10 MG tablet, Take 10 mg by mouth daily. , Disp: , Rfl:  .  Multiple Vitamins-Minerals (ONE-A-DAY WOMENS 50+ ADVANTAGE) TABS, Take 1 tablet by mouth daily with breakfast., Disp: , Rfl:  .  Omega-3 Fatty Acids (FISH OIL) 1000 MG CAPS, 1 capsule with a meal, Disp: , Rfl:  .  polyethylene glycol (MIRALAX / GLYCOLAX) packet, Take 17 g by mouth daily as needed for mild constipation (MIX AND DRINK). , Disp: , Rfl:  .  Propylene Glycol (SYSTANE BALANCE) 0.6 % SOLN, Place 1-2 drops into both eyes daily., Disp: , Rfl:  .  ramipril (ALTACE) 5 MG capsule, TAKE 1 CAPSULE (5 MG TOTAL) BY MOUTH DAILY., Disp: 90 capsule, Rfl: 1 .  simvastatin (ZOCOR) 40 MG tablet, 1 tablet every evening, Disp: , Rfl:  .  triamcinolone cream (KENALOG) 0.1 %, Apply 1 application topically 2 (two) times daily., Disp: 30 g, Rfl: 0 Allergies  Allergen Reactions  . Codeine Nausea And Vomiting  . Statins Other (See Comments)    Pt does not remember the reaction   Social History   Tobacco Use  Smoking Status Former Smoker  Smokeless Tobacco Never Used  Tobacco Comment   quit 1970   Objective:  There were no vitals filed for this visit. Constitutional Patient is a pleasant 87 y.o. Caucasian female, in NAD.Marland Kitchen  Vascular Capillary fill time to digits <3 seconds b/l lower extremities. Palpable pedal pulses b/l LE. Pedal hair absent. Lower extremity skin temperature gradient within normal limits. Capillary refill normal to all digits. No cyanosis or clubbing noted.  Neurologic Normal speech. Oriented to person, place, and time. Protective sensation intact 5/5 intact bilaterally with 10g monofilament b/l. Vibratory sensation intact b/l. Clonus negative b/l.  Dermatologic  Pedal skin is thin shiny, atrophic b/l lower extremities. No open wounds bilaterally. No interdigital macerations bilaterally. Toenails 1-5 b/l elongated, discolored, dystrophic, thickened, crumbly with subungual debris and tenderness to dorsal palpation. Hyperkeratotic lesion(s) R hallux.  No erythema, no edema, no drainage, no flocculence.  Orthopedic: Normal muscle strength 5/5 to all lower extremity muscle groups bilaterally. No pain crepitus or joint limitation noted with ROM b/l. Hallux valgus with bunion deformity noted b/l lower extremities. Hammertoes noted to the 2-5 bilaterally. Utilizes cane for ambulation assistance.   Radiographs: None Assessment:   No diagnosis found. Plan:  Patient was evaluated and treated and all questions answered.  Onychomycosis with pain -Nails palliatively debridement as below. -Educated on self-care  Procedure: Nail Debridement Rationale: Pain Type of Debridement: manual, sharp debridement. Instrumentation: Nail nipper, rotary burr. Number of Nails: 10  -Examined  patient. -No new findings. No new orders. -Continue diabetic foot care principles. -Toenails 1-5 b/l were debrided in length and girth with sterile nail nippers and dremel without iatrogenic bleeding.  -Callus(es) R hallux pared utilizing sterile scalpel blade without complication or incident. Total number debrided =1. -Patient to report any pedal injuries to medical professional immediately. -Patient to continue soft, supportive shoe gear daily. -Patient/POA to call should there be question/concern in the interim.  No follow-ups on file.  Marzetta Board, DPM

## 2020-04-30 DIAGNOSIS — M19011 Primary osteoarthritis, right shoulder: Secondary | ICD-10-CM | POA: Diagnosis not present

## 2020-04-30 DIAGNOSIS — M19012 Primary osteoarthritis, left shoulder: Secondary | ICD-10-CM | POA: Diagnosis not present

## 2020-05-10 DIAGNOSIS — N3000 Acute cystitis without hematuria: Secondary | ICD-10-CM | POA: Insufficient documentation

## 2020-05-10 NOTE — Assessment & Plan Note (Signed)
Stable, chronic.  Continue current medication.    

## 2020-05-10 NOTE — Assessment & Plan Note (Signed)
Do not restrict salt in diet.  Keep up with fluids.

## 2020-05-10 NOTE — Assessment & Plan Note (Signed)
Resolving, complete antibiotics. 

## 2020-05-19 ENCOUNTER — Telehealth: Payer: Self-pay

## 2020-05-19 NOTE — Telephone Encounter (Signed)
Pt said for few nights pt has had to get up 6 -8 times a night to urinate; pt said has frequency of urine and urgency at night time. No fever and no other covid symptoms; pt said that she coughs every once in a while but she has been doing that for years. Pt treated for UTI on 02/24/20 and 03/22/20. Pt has already scheduled an appt on 05/21/20 at 12 noon with Dr Glori Bickers; pt said that was first available appt. Pt had pfizer covid vaccines on 05/13/19, 06/01/19 and 03/15/20.pt said previously pt had been allowed to bring in urine and Dr Diona Browner would prescribe medicine. Pt said if Dr Diona Browner would let pt do that she would and if not would wait on appt with Dr Glori Bickers on 05/21/20. UC & ED precautions given and pt voiced understanding. Sending note to Dr Diona Browner who is out of office, Dr Glori Bickers who pt is to see 05/21/20 and Butch Penny CMA.

## 2020-05-20 NOTE — Telephone Encounter (Signed)
For her safety, patient should be seen and  ideally examined. Can only bring in sample like that  in rare circumstances...if physically cannot come in or if cannot do virtual/phone. Keep appt with Dr. Glori Bickers.

## 2020-05-20 NOTE — Telephone Encounter (Signed)
Pt called and pt notified as instructed. Pt said today she has head or nasal congestion and per Dr Glori Bickers changed 05/21/20 appt to virtual phone call appt and some one will pick up sterile urine container and supplies and will bring back to office on 05/21/20 between 10:30 - 11:00 AM. Pt will have vital signs ready when CMA calls on 05/21/20. Pt will decide if she is going to go to [facility] for covid test due to also having a runny nose a few days ago also.

## 2020-05-21 ENCOUNTER — Other Ambulatory Visit: Payer: Self-pay

## 2020-05-21 ENCOUNTER — Encounter: Payer: Self-pay | Admitting: Family Medicine

## 2020-05-21 ENCOUNTER — Telehealth (INDEPENDENT_AMBULATORY_CARE_PROVIDER_SITE_OTHER): Payer: Medicare Other | Admitting: Family Medicine

## 2020-05-21 ENCOUNTER — Other Ambulatory Visit: Payer: Medicare Other

## 2020-05-21 VITALS — BP 136/89 | Temp 96.8°F

## 2020-05-21 DIAGNOSIS — R350 Frequency of micturition: Secondary | ICD-10-CM

## 2020-05-21 DIAGNOSIS — N3000 Acute cystitis without hematuria: Secondary | ICD-10-CM | POA: Diagnosis not present

## 2020-05-21 LAB — POC URINALSYSI DIPSTICK (AUTOMATED)
Bilirubin, UA: NEGATIVE
Blood, UA: 25
Glucose, UA: NEGATIVE
Ketones, UA: NEGATIVE
Leukocytes, UA: NEGATIVE
Nitrite, UA: NEGATIVE
Protein, UA: NEGATIVE
Spec Grav, UA: 1.02 (ref 1.010–1.025)
Urobilinogen, UA: 0.2 E.U./dL
pH, UA: 6 (ref 5.0–8.0)

## 2020-05-21 MED ORDER — CEPHALEXIN 250 MG PO CAPS: 250.0000 mg | ORAL_CAPSULE | Freq: Two times a day (BID) | ORAL | 0 refills | Status: DC

## 2020-05-21 NOTE — Patient Instructions (Signed)
Drink water  Take the keflex as directed  We will call when urine culture result returns If symptoms suddenly worsen please call or go to ER

## 2020-05-21 NOTE — Progress Notes (Signed)
Virtual Visit via Telephone Note  I connected with [name] on 05/21/20 at 12:00 PM EST by telephone and verified that I am speaking with the correct person using two identifiers.  Location: Patient: home  Provider: office    I discussed the limitations, risks, security and privacy concerns of performing an evaluation and management service by telephone and the availability of in person appointments. I also discussed with the patient that there may be a patient responsible charge related to this service. The patient expressed understanding and agreed to proceed.  Parties involved in encounter  Patient: [name] Daughter [name]  Provider:  Loura Pardon MD   History of Present Illness: Pt presents with frequent urination   Wt Readings from Last 3 Encounters:  03/22/20 117 lb 6 oz (53.2 kg)  02/24/20 115 lb 8 oz (52.4 kg)  02/23/20 117 lb (53.1 kg)    Problem is worse at night -has to get up and go urgenty  (last night 6 times)  Several weeks / getting worse   Wondered if she had a uti   No discomfort or pain  No burning to urinate  No blood in urine  No new back pain  Bladder pressure /pain when she has to urinate  No incomplete emptying that she can tell  She has baseline incontinence (she leaks at night a lot)  She drinks a lot of water but does not feel thirsty   She had a uti 1/17  Drinks one tea per day   Ua: Results for orders placed or performed in visit on 05/21/20  POCT Urinalysis Dipstick (Automated)  Result Value Ref Range   Color, UA Yellow    Clarity, UA Clear    Glucose, UA Negative Negative   Bilirubin, UA Negative    Ketones, UA Negative    Spec Grav, UA 1.020 1.010 - 1.025   Blood, UA 25 Ery/uL    pH, UA 6.0 5.0 - 8.0   Protein, UA Negative Negative   Urobilinogen, UA 0.2 0.2 or 1.0 E.U./dL   Nitrite, UA Negative    Leukocytes, UA Negative Negative     pt takes 81 mg asa daily  Lab Results  Component Value Date    CREATININE 1.15 03/03/2020   BUN 33 (H) 03/03/2020   NA 136 03/03/2020   K 4.4 03/03/2020   CL 103 03/03/2020   CO2 25 03/03/2020   Did a covid test yesterday  Was negative Had runny nose and sneezing  Symptoms are better today -may have been allergies   Patient Active Problem List   Diagnosis Date Noted  . Acute cystitis without hematuria 05/10/2020  . History of colonic polyps 04/28/2020  . Urinary frequency 12/16/2019  . Cough, persistent 11/06/2019  . Acute neck pain 09/26/2019  . Seborrheic dermatitis of scalp 09/26/2019  . Nocturia more than twice per night 08/22/2019  . Seborrheic keratoses 10/16/2017  . Hyponatremia 09/14/2017  . Chronic left upper quadrant pain 08/07/2017  . Right hip pain 07/12/2017  . Hearing loss 02/20/2017  . TIA (transient ischemic attack), possible 11/14/2016  . Left carpal tunnel syndrome 08/11/2016  . Fatigue 06/15/2016  . Constipation, chronic 03/21/2016  . Near syncope 05/07/2015  . Osteoarthritis of right shoulder 05/04/2015  . Counseling regarding end of life decision making 01/15/2015  . Skin lesion of right leg 07/10/2014  . Insomnia 04/10/2014  . Osteoarthritis of right knee 10/14/2013  . Osteoarthritis of left knee 10/14/2013  . Infected sebaceous cyst  11/29/2012  . Gout, chronic, without tophus 12/13/2009  . Osteoporosis 10/29/2009  . PALPITATIONS, OCCASIONAL 07/07/2008  . Allergic rhinitis 06/02/2008  . HEMANGIOMA 07/30/2006  . Controlled type 2 diabetes mellitus without complication, without long-term current use of insulin ([place]) 07/30/2006  . Hyperlipidemia LDL goal <100 07/30/2006  . Essential hypertension, benign 07/30/2006  . GERD 07/30/2006  . RECTAL POLYPS 07/30/2006  . Osteoarthritis 07/30/2006  . CATARACT, HX OF 07/30/2006   Past Medical History:  Diagnosis Date  . Acute pharyngitis   . Acute sinusitis, unspecified   . Acute upper respiratory infections of unspecified site   . Allergy   . Anal and rectal  polyp   . Cataract    History of  . Cavernous angioma   . Degenerative cervical disc 02/2006  . Diabetes mellitus    Type II  . GERD (gastroesophageal reflux disease)   . Gout   . Hemangioma of unspecified site   . Hyperlipidemia   . Hypertension   . Orthostatic hypotension 04/2006   Hospital  . Osteoarthritis   . Osteopenia   . Other abscess of vulva   . Palpitations   . Syncope and collapse    1 episode   Past Surgical History:  Procedure Laterality Date  . ABDOMINAL HYSTERECTOMY    . Aortic dopplers     Mild plaque.  EEG okay  . CATARACT EXTRACTION    . Cavernous angioma    . Cavernous hemangioma  04/2004   Hospital  . TOTAL KNEE ARTHROPLASTY     Right   Social History   Tobacco Use  . Smoking status: Former Research scientist (life sciences)  . Smokeless tobacco: Never Used  . Tobacco comment: quit 1970  Vaping Use  . Vaping Use: Never used  Substance Use Topics  . Alcohol use: Yes    Alcohol/week: 0.0 standard drinks    Comment: wine rarely  . Drug use: No   Family History  Problem Relation Age of Onset  . Heart disease Other        CHF  . Cancer Maternal Aunt        Breast CA   Allergies  Allergen Reactions  . Codeine Nausea And Vomiting  . Statins Other (See Comments)    Pt does not remember the reaction   Current Outpatient Medications on File Prior to Visit  Medication Sig Dispense Refill  . Accu-Chek Softclix Lancets lancets CHECK BLOOD SUGAR 1 TIME A DAY. DX: E11.9 100 each 3  . acetaminophen (TYLENOL) 325 MG tablet Take 650 mg by mouth 2 (two) times daily.    Marland Kitchen alendronate (FOSAMAX) 70 MG tablet 1 tablet    . aspirin EC 81 MG tablet Take 81 mg by mouth at bedtime.     . Blood Glucose Monitoring Suppl (ACCU-CHEK GUIDE) w/Device KIT 1 each by Does not apply route daily. Use to check blood sugar daily as needed.  Dx: E11.9 1 kit 0  . Fiber POWD Take by mouth See admin instructions. Mix 1 rounded teaspoonful into 6-8 ounces of water and drink three times a day    .  fluticasone (FLONASE) 50 MCG/ACT nasal spray Place 2 sprays into both nostrils daily as needed for allergies. 16 g 3  . glucose blood (ACCU-CHEK GUIDE) test strip Use to check blood sugar 1 time daily 100 each 3  . ketoconazole (NIZORAL) 2 % shampoo Apply 1 application topically 2 (two) times a week. 120 mL 2  . loratadine (CLARITIN) 10 MG tablet Take 10  mg by mouth daily.     . Multiple Vitamins-Minerals (ONE-A-DAY WOMENS 50+ ADVANTAGE) TABS Take 1 tablet by mouth daily with breakfast.    . Omega-3 Fatty Acids (FISH OIL) 1000 MG CAPS 1 capsule with a meal    . polyethylene glycol (MIRALAX / GLYCOLAX) packet Take 17 g by mouth daily as needed for mild constipation (MIX AND DRINK).     . Propylene Glycol (SYSTANE BALANCE) 0.6 % SOLN Place 1-2 drops into both eyes daily.    . ramipril (ALTACE) 5 MG capsule TAKE 1 CAPSULE (5 MG TOTAL) BY MOUTH DAILY. 90 capsule 1  . simvastatin (ZOCOR) 40 MG tablet 1 tablet every evening    . triamcinolone cream (KENALOG) 0.1 % Apply 1 application topically 2 (two) times daily. 30 g 0   No current facility-administered medications on file prior to visit.    Observations/Objective: Pt sounds well  Not distressed  Voice is clear  Response to questions is sometimes delayed (hard of hearing)  Fair historian /confuses easily  Nl mood   Assessment and Plan: Problem List Items Addressed This Visit      Genitourinary   Acute cystitis without hematuria    Recurrence of urinary frequency -worse at night  Some bladder pressure /discomfort that is relieved by emptying  Has h/o mixed incontinence as well  ua showed rbc Sent for cx tx with keflex pending result Enc water intake inst to call if symptoms suddenly worsen        Other   Urinary frequency - Primary   Relevant Orders   POCT Urinalysis Dipstick (Automated) (Completed)       Follow Up Instructions: Drink water  Take the keflex as directed  We will call when urine culture result returns If  symptoms suddenly worsen please call or go to ER   I discussed the assessment and treatment plan with the patient. The patient was provided an opportunity to ask questions and all were answered. The patient agreed with the plan and demonstrated an understanding of the instructions.   The patient was advised to call back or seek an in-person evaluation if the symptoms worsen or if the condition fails to improve as anticipated.  I provided 17 minutes of non-face-to-face time during this encounter.   Loura Pardon, MD

## 2020-05-21 NOTE — Assessment & Plan Note (Signed)
Recurrence of urinary frequency -worse at night  Some bladder pressure /discomfort that is relieved by emptying  Has h/o mixed incontinence as well  ua showed rbc Sent for cx tx with keflex pending result Enc water intake inst to call if symptoms suddenly worsen

## 2020-05-22 LAB — URINE CULTURE
MICRO NUMBER:: 11470161
SPECIMEN QUALITY:: ADEQUATE

## 2020-05-30 DIAGNOSIS — M7989 Other specified soft tissue disorders: Secondary | ICD-10-CM | POA: Diagnosis not present

## 2020-05-30 DIAGNOSIS — M25572 Pain in left ankle and joints of left foot: Secondary | ICD-10-CM | POA: Diagnosis not present

## 2020-05-31 ENCOUNTER — Telehealth: Payer: Self-pay | Admitting: *Deleted

## 2020-05-31 NOTE — Telephone Encounter (Signed)
[name],  Can you call [name] and find out what she is needing??

## 2020-05-31 NOTE — Telephone Encounter (Signed)
Spoke with patient. Home Health aide only service is not covered under insurance unless it is Medicaid. It can be ordered through Home health if another service is needed along with health aide. After speaking with patient she does not seem to need PT or OT. Advised patient to keep [place] posted if there is anything else we can help her with. FYI to Dr Diona Browner.

## 2020-05-31 NOTE — Telephone Encounter (Signed)
Patient called stating Thursday when she was coming down some steps her leg gave out on her. Patient stated that the person with her helped prevent her from falling. Patient stated Saturday her left leg and foot were swollen. Patient stated that she went to the Urgent Care on [address] yesterday and saw a [place].  Patient stated that they did a x-ray and advised her that she did not have a fracture. Patient stated that she has a sprained anterior ligament.  Patient stated that they gave her diclofenac 50 mg and her foot was wrapped. Patient was advised to stay off of her foot for a few weeks. Patient stated that the PA there told her that Dr. Diona Browner may be able to get her some help in her home while she is recovering.  Patient is aware that Dr. Diona Browner is out of the office today.

## 2020-05-31 NOTE — Telephone Encounter (Signed)
Call   She is requesting home health? PT?  OT?   If she is looking to have a home health aide to help with ADLS... this is not usually covered by Medicare , if by change it is .. I belive there is a shortage... ask [employer] [employer] options for patient if this is what she is requesting.

## 2020-06-01 NOTE — Telephone Encounter (Signed)
She does not have medicare so home health aide like she wants is not covered.  She or a family member can call personal care service like   [facility] [phone] etc.. I placed list of agencies on [name]'s desk

## 2020-06-01 NOTE — Telephone Encounter (Signed)
Pt left v/m with same info that she almost fell on 05/27/20 but she did not fall; on 05/30/20 was seen at [facility] on [address] and pt repeated what was in this phone note on 05/31/20. Pt wants someone to come out and help pt with a bath while recovering. Pt spoke with Anastasiya CMA on 05/31/20 and pt is requesting cb after Dr Diona Browner reviews this note with what can be done to help pt in the home.

## 2020-06-01 NOTE — Telephone Encounter (Signed)
[name] notified as instructed by telephone.  Patient states understanding.  She was seen at Urgent Care on 05/30/2020 and was diagnosed with a sprained ankle.  She was given Diclofenac 50 mg to take one twice a day and advised to take Tylenol for break through pain.  I tried to reassure her that as long as she takes her medication that the urgent care prescribed and try to stay off foot as much as able as well as ice the ankle, she should see daily improvements.

## 2020-06-07 ENCOUNTER — Telehealth: Payer: Self-pay | Admitting: *Deleted

## 2020-06-07 NOTE — Telephone Encounter (Signed)
Patient's daughter called and left a voicemail stating that her mom almost fell a week or so ago. [name] stated that she has been wrapping her ankle and putting ice on it. Patient's daughter wants to know how long she should do that? [name] stated that her mom is also having some pain off and on on the left side of her buttocks and wants to know what Dr. Diona Browner would recommend for that.  [name] stated that you can call her mom back.

## 2020-06-08 NOTE — Telephone Encounter (Signed)
Margaret notified as instructed by telephone.  Ankle Sprain exercises mailed to patient as instructed by Dr. Diona Browner.  Follow up appointment scheduled for 06/22/2020 at 2:20 pm.

## 2020-06-08 NOTE — Telephone Encounter (Signed)
Continue to wrap or better yet get lace up ankle brace for stability to wear  during the day. Likely can stop ice. Elevate ankle when sitting.  Mail her ankle sprain home PT exercises to start twice daily ( take off brace during these exercises) Use tylenol ES 500 mg 2 tabs three times daily prn pain.  Have her make follow up appt  in 2 weeks.Marland Kitchen earlier if pain increasing. Can assess back then if pain continued

## 2020-06-08 NOTE — Telephone Encounter (Signed)
Patients daughter called and wanted to know if her mother needed to wear the brace at night. Informed patients daughter that she could take the brace off at night and during exercises, but to put the boot on during the day when she is up. Patients daughter verbalized understanding.

## 2020-06-10 ENCOUNTER — Telehealth: Payer: Self-pay

## 2020-06-10 ENCOUNTER — Other Ambulatory Visit: Payer: Self-pay | Admitting: Family Medicine

## 2020-06-10 NOTE — Telephone Encounter (Signed)
Vm from pt stating she sprained her ankle about 2 wks ago.  Says the ankle is still swollen but foot swelling has decreased.  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  Plz advise at [phone].

## 2020-06-10 NOTE — Telephone Encounter (Signed)
Agree with recommendations.  

## 2020-06-10 NOTE — Telephone Encounter (Signed)
Spoke with [name] and advised her that it would okay for her to attend the funeral tomorrow afternoon as long as she wears her lace up ankle brace for support.  She is also schedule to go Monday for a hearing test and she wanted to make sure she can still go to that.  I assured her that she go keep appointment and again advised her that when she is up and about to make sure she is wearing her ankle brace and when she is back at home to rest and elevate.  [name] wanted to know how long she will need to wear the ankle brace.  I recommended that she continue to wear the brace until she follows up with Dr. Diona Browner on 07/01/2020.  Patient states understanding.

## 2020-06-22 ENCOUNTER — Other Ambulatory Visit: Payer: Self-pay

## 2020-06-22 ENCOUNTER — Ambulatory Visit (INDEPENDENT_AMBULATORY_CARE_PROVIDER_SITE_OTHER): Payer: Medicare Other | Admitting: Family Medicine

## 2020-06-22 ENCOUNTER — Encounter: Payer: Self-pay | Admitting: Family Medicine

## 2020-06-22 VITALS — BP 104/60 | HR 91 | Temp 98.4°F | Ht 63.0 in | Wt 122.5 lb

## 2020-06-22 DIAGNOSIS — S93492A Sprain of other ligament of left ankle, initial encounter: Secondary | ICD-10-CM | POA: Diagnosis not present

## 2020-06-22 NOTE — Patient Instructions (Signed)
  Triamcinolone Acetonide 0.1 % 1 application Topical 2 times daily to right leg skin spot.  Continue wearing brace when up on feet until left ankle pain resolved.  Do ankle rehab exercise as able.

## 2020-06-22 NOTE — Progress Notes (Signed)
Patient ID: [name], female    DOB: 10-18-1922, 87 y.o.   MRN: [id number]  This visit was conducted in person.  BP 104/60   Pulse 91   Temp 98.4 F (36.9 C) (Temporal)   Ht 5' 3"  (1.6 m)   Wt 122 lb 8 oz (55.6 kg)   SpO2 96%   BMI 21.70 kg/m    CC:  Chief Complaint  Patient presents with  . Follow-up    Ankle Sprain    Subjective:   HPI: [name] is a 87 y.o. female presenting on 06/22/2020 for Follow-up (Ankle Sprain)  87 year old female presents for follow up ankle sprain. She hurt her left ankle.  Saw Dr. Ola Spurr 05/30/2020.  She had lost her balance on stairs, twisted ankle. Dx with ankle sprain,   Clear X-ray per report. Treated with acetaminophen prn and diclofenac 50 mg BID x 7 days. I recommend ice, start home PT and wear brace.   Today she reports pain gradually improved.  Full ROM     Relevant past medical, surgical, family and social history reviewed and updated as indicated. Interim medical history since our last visit reviewed. Allergies and medications reviewed and updated. Outpatient Medications Prior to Visit  Medication Sig Dispense Refill  . Accu-Chek Softclix Lancets lancets CHECK BLOOD SUGAR 1 TIME A DAY. DX: E11.9 100 each 3  . acetaminophen (TYLENOL) 325 MG tablet Take 650 mg by mouth 2 (two) times daily.    Marland Kitchen aspirin EC 81 MG tablet Take 81 mg by mouth at bedtime.     . Blood Glucose Monitoring Suppl (ACCU-CHEK GUIDE) w/Device KIT 1 each by Does not apply route daily. Use to check blood sugar daily as needed.  Dx: E11.9 1 kit 0  . Fiber POWD Take by mouth See admin instructions. Mix 1 rounded teaspoonful into 6-8 ounces of water and drink three times a day    . fluticasone (FLONASE) 50 MCG/ACT nasal spray PLACE 2 SPRAYS INTO BOTH NOSTRILS DAILY AS NEEDED FOR ALLERGIES. 48 mL 1  . glucose blood (ACCU-CHEK GUIDE) test strip Use to check blood sugar 1 time daily 100 each 3  . ketoconazole (NIZORAL) 2 % shampoo Apply 1 application  topically 2 (two) times a week. 120 mL 2  . loratadine (CLARITIN) 10 MG tablet Take 10 mg by mouth daily.     . Multiple Vitamins-Minerals (ONE-A-DAY WOMENS 50+ ADVANTAGE) TABS Take 1 tablet by mouth daily with breakfast.    . Omega-3 Fatty Acids (FISH OIL) 1000 MG CAPS 1 capsule with a meal    . polyethylene glycol (MIRALAX / GLYCOLAX) packet Take 17 g by mouth daily as needed for mild constipation (MIX AND DRINK).     . Propylene Glycol (SYSTANE BALANCE) 0.6 % SOLN Place 1-2 drops into both eyes daily.    . ramipril (ALTACE) 5 MG capsule TAKE 1 CAPSULE (5 MG TOTAL) BY MOUTH DAILY. 90 capsule 1  . triamcinolone cream (KENALOG) 0.1 % Apply 1 application topically 2 (two) times daily. 30 g 0  . simvastatin (ZOCOR) 40 MG tablet 1 tablet every evening (Patient not taking: Reported on 06/22/2020)    . alendronate (FOSAMAX) 70 MG tablet 1 tablet    . cephALEXin (KEFLEX) 250 MG capsule Take 1 capsule (250 mg total) by mouth 2 (two) times daily. 14 capsule 0   No facility-administered medications prior to visit.     Per HPI unless specifically indicated in ROS section below Review  of Systems  Constitutional: Negative for fatigue and fever.  HENT: Negative for congestion.   Eyes: Negative for pain.  Respiratory: Negative for cough and shortness of breath.   Cardiovascular: Negative for chest pain, palpitations and leg swelling.  Gastrointestinal: Negative for abdominal pain.  Genitourinary: Negative for dysuria and vaginal bleeding.  Musculoskeletal: Negative for back pain.  Neurological: Negative for syncope, light-headedness and headaches.  Psychiatric/Behavioral: Negative for dysphoric mood.   Objective:  BP 104/60   Pulse 91   Temp 98.4 F (36.9 C) (Temporal)   Ht 5' 3"  (1.6 m)   Wt 122 lb 8 oz (55.6 kg)   SpO2 96%   BMI 21.70 kg/m   Wt Readings from Last 3 Encounters:  06/22/20 122 lb 8 oz (55.6 kg)  03/22/20 117 lb 6 oz (53.2 kg)  02/24/20 115 lb 8 oz (52.4 kg)      Physical  Exam Constitutional:      General: She is not in acute distress.Vital signs are normal.     Appearance: Normal appearance. She is well-developed and well-nourished. She is not ill-appearing or toxic-appearing.  HENT:     Head: Normocephalic.     Right Ear: Hearing, tympanic membrane, ear canal and external ear normal. Tympanic membrane is not erythematous, retracted or bulging.     Left Ear: Hearing, tympanic membrane, ear canal and external ear normal. Tympanic membrane is not erythematous, retracted or bulging.     Nose: No mucosal edema or rhinorrhea.     Right Sinus: No maxillary sinus tenderness or frontal sinus tenderness.     Left Sinus: No maxillary sinus tenderness or frontal sinus tenderness.     Mouth/Throat:     Mouth: Oropharynx is clear and moist and mucous membranes are normal.     Pharynx: Uvula midline.  Eyes:     General: Lids are normal. Lids are everted, no foreign bodies appreciated.     Extraocular Movements: EOM normal.     Conjunctiva/sclera: Conjunctivae normal.     Pupils: Pupils are equal, round, and reactive to light.  Neck:     Thyroid: No thyroid mass or thyromegaly.     Vascular: No carotid bruit.     Trachea: Trachea normal.  Cardiovascular:     Rate and Rhythm: Normal rate and regular rhythm.     Pulses: Normal pulses and intact distal pulses.     Heart sounds: Normal heart sounds, S1 normal and S2 normal. No murmur heard. No friction rub. No gallop.   Pulmonary:     Effort: Pulmonary effort is normal. No tachypnea or respiratory distress.     Breath sounds: Normal breath sounds. No decreased breath sounds, wheezing, rhonchi or rales.  Abdominal:     General: Bowel sounds are normal.     Palpations: Abdomen is soft.     Tenderness: There is no abdominal tenderness.  Musculoskeletal:     Cervical back: Normal range of motion and neck supple.     Left ankle: Swelling present. Tenderness present over the ATF ligament. No lateral malleolus or medial  malleolus tenderness. Normal range of motion.     Comments: Very mild pain and minimal swelling medially.  Skin:    General: Skin is warm, dry and intact.     Findings: No rash.  Neurological:     Mental Status: She is alert.  Psychiatric:        Mood and Affect: Mood is not anxious or depressed.  Speech: Speech normal.        Behavior: Behavior normal. Behavior is cooperative.        Thought Content: Thought content normal.        Cognition and Memory: Cognition and memory normal.        Judgment: Judgment normal.       Results for orders placed or performed in visit on 05/21/20  Urine Culture   Specimen: Urine  Result Value Ref Range   MICRO NUMBER: [id number]    SPECIMEN QUALITY: Adequate    Sample Source NOT GIVEN    STATUS: FINAL    Result:      Less than 10,000 CFU/mL of single Gram negative organism isolated. No further testing will be performed. If clinically indicated, recollection using a method to minimize contamination, with prompt transfer to Urine Culture Transport Tube, is recommended.  POCT Urinalysis Dipstick (Automated)  Result Value Ref Range   Color, UA Yellow    Clarity, UA Clear    Glucose, UA Negative Negative   Bilirubin, UA Negative    Ketones, UA Negative    Spec Grav, UA 1.020 1.010 - 1.025   Blood, UA 25 Ery/uL    pH, UA 6.0 5.0 - 8.0   Protein, UA Negative Negative   Urobilinogen, UA 0.2 0.2 or 1.0 E.U./dL   Nitrite, UA Negative    Leukocytes, UA Negative Negative    This visit occurred during the SARS-CoV-2 public health emergency.  Safety protocols were in place, including screening questions prior to the visit, additional usage of staff PPE, and extensive cleaning of exam room while observing appropriate contact time as indicated for disinfecting solutions.   COVID 19 screen:  No recent travel or known exposure to COVID19 The patient denies respiratory symptoms of COVID 19 at this time. The importance of social distancing was  discussed today.   Assessment and Plan    Problem List Items Addressed This Visit    Sprain of anterior talofibular ligament of left ankle - Primary    Continue wearing brace when up on feet until left ankle pain resolved.  Do ankle rehab exercise as able.           Eliezer Lofts, MD

## 2020-06-22 NOTE — Assessment & Plan Note (Signed)
Continue wearing brace when up on feet until left ankle pain resolved.  Do ankle rehab exercise as able.

## 2020-07-28 ENCOUNTER — Ambulatory Visit: Payer: Medicare Other | Admitting: Podiatry

## 2020-07-30 ENCOUNTER — Encounter: Payer: Self-pay | Admitting: Podiatry

## 2020-07-30 ENCOUNTER — Ambulatory Visit (INDEPENDENT_AMBULATORY_CARE_PROVIDER_SITE_OTHER): Payer: Medicare Other | Admitting: Podiatry

## 2020-07-30 ENCOUNTER — Other Ambulatory Visit: Payer: Self-pay

## 2020-07-30 DIAGNOSIS — M79675 Pain in left toe(s): Secondary | ICD-10-CM | POA: Diagnosis not present

## 2020-07-30 DIAGNOSIS — E1142 Type 2 diabetes mellitus with diabetic polyneuropathy: Secondary | ICD-10-CM | POA: Diagnosis not present

## 2020-07-30 DIAGNOSIS — L84 Corns and callosities: Secondary | ICD-10-CM | POA: Diagnosis not present

## 2020-07-30 DIAGNOSIS — M2042 Other hammer toe(s) (acquired), left foot: Secondary | ICD-10-CM

## 2020-07-30 DIAGNOSIS — B351 Tinea unguium: Secondary | ICD-10-CM | POA: Diagnosis not present

## 2020-07-30 DIAGNOSIS — M2041 Other hammer toe(s) (acquired), right foot: Secondary | ICD-10-CM

## 2020-07-30 DIAGNOSIS — M79674 Pain in right toe(s): Secondary | ICD-10-CM | POA: Diagnosis not present

## 2020-08-02 NOTE — Progress Notes (Signed)
  Subjective:  Patient ID: [name], female    DOB: May 15, 1922,  MRN: [id number]  87 y.o. female presents with preventative diabetic foot care and painful callus(es) right great toe and painful thick toenails that are difficult to trim. Pain interferes with ambulation. Aggravating factors include wearing enclosed shoe gear. Pain is relieved with periodic professional debridement.   She states she sprained her left ankle a couple of months ago after a fall. She did see Dr. Diona Browner who prescribed pain medication and provided brace. She states it feels much better now.  PCP is Dr. Eliezer Lofts. Last visit was 06/22/2020.  Patient states blood glucose was 131 mg/dl yesterday. She has not checked it today.  Review of Systems: Negative except as noted in the HPI.   Allergies  Allergen Reactions  . Codeine Nausea And Vomiting  . Other     Other reaction(s): nausea  . Statins Other (See Comments)    Pt does not remember the reaction   Objective:  There were no vitals filed for this visit. Constitutional Patient is a pleasant 87 y.o. Caucasian female, in NAD.Marland Kitchen  Vascular Capillary fill time to digits <3 seconds b/l lower extremities. Palpable pedal pulses b/l LE. Pedal hair absent. Lower extremity skin temperature gradient within normal limits. Capillary refill normal to all digits. No cyanosis or clubbing noted.  Neurologic Normal speech. Oriented to person, place, and time. Protective sensation intact 5/5 intact bilaterally with 10g monofilament b/l. Vibratory sensation intact b/l. Clonus negative b/l.  Dermatologic Pedal skin is thin shiny, atrophic b/l lower extremities. No open wounds bilaterally. No interdigital macerations bilaterally. Toenails 1-5 b/l elongated, discolored, dystrophic, thickened, crumbly with subungual debris and tenderness to dorsal palpation. Hyperkeratotic lesion(s) R hallux.  No erythema, no edema, no drainage, no flocculence.  Orthopedic: Normal muscle strength  5/5 to all lower extremity muscle groups bilaterally. No pain crepitus or joint limitation noted with ROM b/l. Hallux valgus with bunion deformity noted b/l lower extremities. Hammertoes noted to the 2-5 bilaterally. Utilizes cane for ambulation assistance. No ecchymosis nor edema lateral aspect left ankle. No pain on palpation.   Radiographs: None Assessment:   1. Pain due to onychomycosis of toenails of both feet   2. Callus   3. Acquired hammertoes of both feet   4. Diabetic peripheral neuropathy associated with type 2 diabetes mellitus ([place])    Plan:  Patient was evaluated and treated and all questions answered.  Onychomycosis with pain -Nails palliatively debridement as below. -Educated on self-care  Procedure: Nail Debridement Rationale: Pain Type of Debridement: manual, sharp debridement. Instrumentation: Nail nipper, rotary burr. Number of Nails: 10  -Examined patient. -No new findings. No new orders. -Continue diabetic foot care principles. -Toenails 1-5 b/l were debrided in length and girth with sterile nail nippers and dremel without iatrogenic bleeding.  -Callus(es) R hallux pared utilizing sterile scalpel blade without complication or incident. Total number debrided =1. -Patient to report any pedal injuries to medical professional immediately. -Patient to continue soft, supportive shoe gear daily. -Patient/POA to call should there be question/concern in the interim.  Return in about 3 months (around 10/29/2020).  Marzetta Board, DPM

## 2020-08-18 DIAGNOSIS — M19011 Primary osteoarthritis, right shoulder: Secondary | ICD-10-CM | POA: Diagnosis not present

## 2020-08-18 DIAGNOSIS — M1712 Unilateral primary osteoarthritis, left knee: Secondary | ICD-10-CM | POA: Diagnosis not present

## 2020-08-18 DIAGNOSIS — M19012 Primary osteoarthritis, left shoulder: Secondary | ICD-10-CM | POA: Diagnosis not present

## 2020-09-08 DIAGNOSIS — M1712 Unilateral primary osteoarthritis, left knee: Secondary | ICD-10-CM | POA: Diagnosis not present

## 2020-09-16 ENCOUNTER — Telehealth: Payer: Self-pay

## 2020-09-16 NOTE — Telephone Encounter (Signed)
Spoke with Ms. [name] and scheduled her with Dr. Diona Browner on 09/17/2020 at 2:40 pm.

## 2020-09-16 NOTE — Telephone Encounter (Signed)
Pt said for several days her rt foot was itching; pt used an anti itch spray on rt foot and the itching has stopped but pt said there are several red splotches on rt foot; not sure if had blisters or not; pt said the darker red splotches might have had blisters previously. No fever, pt does not feel bad; no covid symptoms or known exposure per pt. Dr Diona Browner said to send pictures thru my chart. Someone is helping pt today at her home and she will send pictures to Dr Diona Browner of pts foot. Sending note to Dr Diona Browner.

## 2020-09-17 ENCOUNTER — Other Ambulatory Visit: Payer: Self-pay

## 2020-09-17 ENCOUNTER — Ambulatory Visit (INDEPENDENT_AMBULATORY_CARE_PROVIDER_SITE_OTHER): Payer: Medicare Other | Admitting: Family Medicine

## 2020-09-17 ENCOUNTER — Encounter: Payer: Self-pay | Admitting: Family Medicine

## 2020-09-17 VITALS — BP 110/60 | HR 86 | Temp 97.9°F | Ht 63.0 in | Wt 127.8 lb

## 2020-09-17 DIAGNOSIS — B353 Tinea pedis: Secondary | ICD-10-CM

## 2020-09-17 MED ORDER — CLOTRIMAZOLE-BETAMETHASONE 1-0.05 % EX CREA: 1.0000 "application " | TOPICAL_CREAM | Freq: Two times a day (BID) | CUTANEOUS | 1 refills | Status: DC

## 2020-09-17 NOTE — Progress Notes (Signed)
Patient ID: [name], female    DOB: 1923/02/14, 87 y.o.   MRN: [id number]  This visit was conducted in person.  BP 110/60   Pulse 86   Temp 97.9 F (36.6 C) (Temporal)   Ht 5' 3"  (1.6 m)   Wt 127 lb 12 oz (57.9 kg)   SpO2 98%   BMI 22.63 kg/m    CC:  Chief Complaint  Patient presents with   Red Spots on Feet    Subjective:   HPI: [name] is a 87 y.o. female presenting on 09/17/2020 for Red Spots on Feet   She has noted recently in last 1-2 weeks of red itchy right foot. Flacky dry skin, very itchy.     She has treated with tinactin spray and  Some OTC cream for antiitch cream.   Relevant past medical, surgical, family and social history reviewed and updated as indicated. Interim medical history since our last visit reviewed. Allergies and medications reviewed and updated. Outpatient Medications Prior to Visit  Medication Sig Dispense Refill   Accu-Chek Softclix Lancets lancets CHECK BLOOD SUGAR 1 TIME A DAY. DX: E11.9 100 each 3   acetaminophen (TYLENOL) 325 MG tablet Take 650 mg by mouth 2 (two) times daily.     aspirin EC 81 MG tablet Take 81 mg by mouth at bedtime.      Blood Glucose Monitoring Suppl (ACCU-CHEK GUIDE) w/Device KIT 1 each by Does not apply route daily. Use to check blood sugar daily as needed.  Dx: E11.9 1 kit 0   Fiber POWD Take by mouth See admin instructions. Mix 1 rounded teaspoonful into 6-8 ounces of water and drink three times a day     fluticasone (FLONASE) 50 MCG/ACT nasal spray PLACE 2 SPRAYS INTO BOTH NOSTRILS DAILY AS NEEDED FOR ALLERGIES. 48 mL 1   glucose blood (ACCU-CHEK GUIDE) test strip Use to check blood sugar 1 time daily 100 each 3   loratadine (CLARITIN) 10 MG tablet Take 10 mg by mouth daily.      Multiple Vitamins-Minerals (ONE-A-DAY WOMENS 50+ ADVANTAGE) TABS Take 1 tablet by mouth daily with breakfast.     Omega-3 Fatty Acids (FISH OIL) 1000 MG CAPS 1 capsule with a meal     polyethylene glycol (MIRALAX /  GLYCOLAX) packet Take 17 g by mouth daily as needed for mild constipation (MIX AND DRINK).      Propylene Glycol (SYSTANE BALANCE) 0.6 % SOLN Place 1-2 drops into both eyes daily.     ramipril (ALTACE) 5 MG capsule TAKE 1 CAPSULE (5 MG TOTAL) BY MOUTH DAILY. 90 capsule 1   triamcinolone cream (KENALOG) 0.1 % Apply 1 application topically 2 (two) times daily. 30 g 0   diclofenac (VOLTAREN) 50 MG EC tablet Take 50 mg by mouth 2 (two) times daily.     ketoconazole (NIZORAL) 2 % shampoo Apply 1 application topically 2 (two) times a week. 120 mL 2   amoxicillin (AMOXIL) 500 MG capsule SMARTSIG:4 Capsule(s) By Mouth     No facility-administered medications prior to visit.     Per HPI unless specifically indicated in ROS section below Review of Systems  Constitutional:  Negative for fatigue and fever.  HENT:  Negative for ear pain.   Eyes:  Negative for pain.  Respiratory:  Negative for chest tightness and shortness of breath.   Cardiovascular:  Negative for chest pain, palpitations and leg swelling.  Gastrointestinal:  Negative for abdominal pain.  Genitourinary:  Negative  for dysuria.  Skin:  Positive for rash.  Objective:  BP 110/60   Pulse 86   Temp 97.9 F (36.6 C) (Temporal)   Ht 5' 3"  (1.6 m)   Wt 127 lb 12 oz (57.9 kg)   SpO2 98%   BMI 22.63 kg/m   Wt Readings from Last 3 Encounters:  09/17/20 127 lb 12 oz (57.9 kg)  06/22/20 122 lb 8 oz (55.6 kg)  03/22/20 117 lb 6 oz (53.2 kg)      Physical Exam Constitutional:      General: She is not in acute distress.    Appearance: Normal appearance. She is well-developed. She is not ill-appearing or toxic-appearing.  HENT:     Head: Normocephalic.     Right Ear: Hearing, tympanic membrane, ear canal and external ear normal. Tympanic membrane is not erythematous, retracted or bulging.     Left Ear: Hearing, tympanic membrane, ear canal and external ear normal. Tympanic membrane is not erythematous, retracted or bulging.     Nose:  No mucosal edema or rhinorrhea.     Right Sinus: No maxillary sinus tenderness or frontal sinus tenderness.     Left Sinus: No maxillary sinus tenderness or frontal sinus tenderness.     Mouth/Throat:     Pharynx: Uvula midline.  Eyes:     General: Lids are normal. Lids are everted, no foreign bodies appreciated.     Conjunctiva/sclera: Conjunctivae normal.     Pupils: Pupils are equal, round, and reactive to light.  Neck:     Thyroid: No thyroid mass or thyromegaly.     Vascular: No carotid bruit.     Trachea: Trachea normal.  Cardiovascular:     Rate and Rhythm: Normal rate and regular rhythm.     Pulses: Normal pulses.     Heart sounds: Normal heart sounds, S1 normal and S2 normal. No murmur heard.   No friction rub. No gallop.  Pulmonary:     Effort: Pulmonary effort is normal. No tachypnea or respiratory distress.     Breath sounds: Normal breath sounds. No decreased breath sounds, wheezing, rhonchi or rales.  Abdominal:     General: Bowel sounds are normal.     Palpations: Abdomen is soft.     Tenderness: There is no abdominal tenderness.  Musculoskeletal:     Cervical back: Normal range of motion and neck supple.  Skin:    General: Skin is warm and dry.     Findings: No rash.     Comments: Dry flaky skin with erythematous edge on bilateral feet.. consistent with fungal infeciton  Neurological:     Mental Status: She is alert.  Psychiatric:        Mood and Affect: Mood is not anxious or depressed.        Speech: Speech normal.        Behavior: Behavior normal. Behavior is cooperative.        Thought Content: Thought content normal.        Judgment: Judgment normal.      Results for orders placed or performed in visit on 05/21/20  Urine Culture   Specimen: Urine  Result Value Ref Range   MICRO NUMBER: [id number]    SPECIMEN QUALITY: Adequate    Sample Source NOT GIVEN    STATUS: FINAL    Result:      Less than 10,000 CFU/mL of single Gram negative organism  isolated. No further testing will be performed. If clinically indicated, recollection using a  method to minimize contamination, with prompt transfer to Urine Culture Transport Tube, is recommended.  POCT Urinalysis Dipstick (Automated)  Result Value Ref Range   Color, UA Yellow    Clarity, UA Clear    Glucose, UA Negative Negative   Bilirubin, UA Negative    Ketones, UA Negative    Spec Grav, UA 1.020 1.010 - 1.025   Blood, UA 25 Ery/uL    pH, UA 6.0 5.0 - 8.0   Protein, UA Negative Negative   Urobilinogen, UA 0.2 0.2 or 1.0 E.U./dL   Nitrite, UA Negative    Leukocytes, UA Negative Negative    This visit occurred during the SARS-CoV-2 public health emergency.  Safety protocols were in place, including screening questions prior to the visit, additional usage of staff PPE, and extensive cleaning of exam room while observing appropriate contact time as indicated for disinfecting solutions.   COVID 19 screen:  No recent travel or known exposure to COVID19 The patient denies respiratory symptoms of COVID 19 at this time. The importance of social distancing was discussed today.   Assessment and Plan    Problem List Items Addressed This Visit     Tinea pedis of both feet - Primary    Wash socks in hot or bleach water.  Spray shoes with tinactin 1-2 a week.  Apply topical cream to right foot twice daily.      Meds ordered this encounter  Medications   DISCONTD: clotrimazole-betamethasone (LOTRISONE) cream    Sig: Apply 1 application topically 2 (two) times daily.    Dispense:  30 g    Refill:  1      Eliezer Lofts, MD

## 2020-09-17 NOTE — Patient Instructions (Addendum)
Wash socks in hot or bleach water.  Spray shoes with tinactin 1-2 a week.  Apply topical cream to right foot twice daily.  Athlete's Foot Athlete's foot (tinea pedis) is a fungal infection of the skin on your feet. It often occurs on the skin that is between or underneath the toes. It can also occur on the soles of your feet. The infection can spread from person to person (is contagious). It can also spread when a person's bare feet come in contact with the fungus on shower floors or on items such as shoes. What are the causes? This condition is caused by a fungus that grows in warm, moist places. You can get athlete's foot by sharing shoes, shower stalls, towels, and wet floors with someone who is infected. Not washing your feet or changing your socks often enough can also lead to athlete's foot. What increases the risk? This condition is more likely to develop in: Men. People who have a weak body defense system (immune system). People who have diabetes. People who use public showers, such as at a gym. People who wear heavy-duty shoes, such as Environmental manager. Seasons with warm, humid weather. What are the signs or symptoms? Symptoms of this condition include: Itchy areas between your toes or on the soles of your feet. White, flaky, or scaly areas between your toes or on the soles of your feet. Very itchy small blisters between your toes or on the soles of your feet. Small cuts in your skin. These cuts can become infected. Thick or discolored toenails.   How is this diagnosed? This condition may be diagnosed with a physical exam and a review of your medical history. Your health care provider may also take a skin or toenail sample to examine under a microscope. How is this treated? This condition is treated with antifungal medicines. These may be applied as powders, ointments, or creams. In severe cases, an oral antifungal medicine may be given. Follow these instructions at  home: Medicines Apply or take over-the-counter and prescription medicines only as told by your health care provider. Apply your antifungal medicine as told by your health care provider. Do not stop using the antifungal even if your condition improves. Foot care Do not scratch your feet. Keep your feet dry: Wear cotton or wool socks. Change your socks every day or if they become wet. Wear shoes that allow air to flow, such as sandals or canvas tennis shoes. Wash and dry your feet, including the area between your toes. Also, wash and dry your feet: Every day or as told by your health care provider. After exercising. General instructions Do not let others use towels, shoes, nail clippers, or other personal items that touch your feet. Protect your feet by wearing sandals in wet areas, such as locker rooms and shared showers. Keep all follow-up visits as told by your health care provider. This is important. If you have diabetes, keep your blood sugar under control. Contact a health care provider if: You have a fever. You have swelling, soreness, warmth, or redness in your foot. Your feet are not getting better with treatment. Your symptoms get worse. You have new symptoms. Summary Athlete's foot (tinea pedis) is a fungal infection of the skin on your feet. It often occurs on skin that is between or underneath the toes. This condition is caused by a fungus that grows in warm, moist places. Symptoms include white, flaky, or scaly areas between your toes or on the soles  of your feet. This condition is treated with antifungal medicines. Keep your feet clean. Always dry them thoroughly. This information is not intended to replace advice given to you by your health care provider. Make sure you discuss any questions you have with your health care provider. Document Revised: 11/27/2019 Document Reviewed: 11/27/2019 Elsevier Patient Education  2021 [employer].

## 2020-10-07 ENCOUNTER — Telehealth: Payer: Self-pay

## 2020-10-07 NOTE — Telephone Encounter (Signed)
Pt left a message on triage line stating she is still having increased urinary frequency and would like to start the "medication" that was discussed recently. Please advise at [phone]

## 2020-10-08 ENCOUNTER — Other Ambulatory Visit: Payer: Self-pay | Admitting: Family Medicine

## 2020-10-08 MED ORDER — MIRABEGRON ER 25 MG PO TB24: 25.0000 mg | ORAL_TABLET | Freq: Every day | ORAL | 11 refills | Status: DC

## 2020-10-08 NOTE — Telephone Encounter (Signed)
Pt called back and left VM on triage line in regards to her urinary frequency that she called about yesterday. She is requesting a call back to [phone].

## 2020-10-08 NOTE — Telephone Encounter (Signed)
Spoke with Ms. [name].  She denies any other symptoms other than frequency.  She states she gets up 5 to 6 times a night to go to the bathroom.  [place]

## 2020-10-08 NOTE — Addendum Note (Signed)
Addended by: Carter Kitten on: 10/08/2020 03:16 PM   Modules accepted: Orders

## 2020-10-08 NOTE — Telephone Encounter (Signed)
Patient left a voicemail requesting a call back about her urinary problems.

## 2020-10-11 ENCOUNTER — Telehealth: Payer: Self-pay

## 2020-10-11 NOTE — Telephone Encounter (Signed)
Pt called to say the Myrbetriq was too expensive. She has hit her doughnut hole. Asking what else can she take. Please advise at [phone]. Send to [place]. Pt aware Dr Diona Browner is out of the office and it could be tomorrow.

## 2020-10-12 NOTE — Telephone Encounter (Signed)
Patient called back following up on the message that she had left yesterday. Advised patient that Dr. Diona Browner if out of the office this week and may not see the message until next week. Patient stated that the Myrbetriq was going to cost her over $300. Patient was advised that she should reach out to her insurance company and see if they cover something else that would be less expensive for her. Patient stated that she will call her insurance company to get more information from them and will call the office back.

## 2020-10-12 NOTE — Telephone Encounter (Signed)
Reviewed.  There are a number of other options, but this should be able to wait for Dr. Jacinto Reap.

## 2020-10-13 ENCOUNTER — Ambulatory Visit (INDEPENDENT_AMBULATORY_CARE_PROVIDER_SITE_OTHER): Payer: Medicare Other

## 2020-10-13 ENCOUNTER — Other Ambulatory Visit: Payer: Self-pay | Admitting: Family Medicine

## 2020-10-13 ENCOUNTER — Other Ambulatory Visit: Payer: Self-pay

## 2020-10-13 DIAGNOSIS — Z0000 Encounter for general adult medical examination without abnormal findings: Secondary | ICD-10-CM

## 2020-10-13 MED ORDER — OXYBUTYNIN CHLORIDE 5 MG PO TABS: 5.0000 mg | ORAL_TABLET | Freq: Every day | ORAL | 11 refills | Status: DC

## 2020-10-13 NOTE — Progress Notes (Signed)
Subjective:   [name] is a 87 y.o. female who presents for Medicare Annual (Subsequent) preventive examination.  Review of Systems      I connected with the patient today by telephone and verified that I am speaking with the correct person using two identifiers. Location patient: home Location nurse: work Persons participating in the telephone visit: patient, nurse.   I discussed the limitations, risks, security and privacy concerns of performing an evaluation and management service by telephone and the availability of in person appointments. I also discussed with the patient that there may be a patient responsible charge related to this service. The patient expressed understanding and verbally consented to this telephonic visit.        Cardiac Risk Factors include: advanced age (>54mn, >>69women);diabetes mellitus;Other (see comment), Risk factor comments: hyperlipidemia     Objective:    Today's Vitals   There is no height or weight on file to calculate BMI.  Advanced Directives 10/13/2020 02/23/2020 09/28/2019 07/23/2019 07/08/2019 05/21/2019 04/15/2019  Does Patient Have a Medical Advance Directive? Yes No Yes No No No No  Type of [place] - - - -  Does patient want to make changes to medical advance directive? - - - - No - Patient declined - No - Patient declined  Copy of [place] Chart? No - copy requested - No - copy requested - - - -  Would patient like information on creating a medical advance directive? - No - Patient declined - - - No - Patient declined -    Current Medications (verified) Outpatient Encounter Medications as of 10/13/2020  Medication Sig   Accu-Chek Softclix Lancets lancets CHECK BLOOD SUGAR 1 TIME A DAY. DX: E11.9   acetaminophen (TYLENOL) 325 MG tablet Take 650 mg by mouth 2 (two) times daily.   aspirin EC 81 MG tablet Take 81 mg  by mouth at bedtime.    Blood Glucose Monitoring Suppl (ACCU-CHEK GUIDE) w/Device KIT 1 each by Does not apply route daily. Use to check blood sugar daily as needed.  Dx: E11.9   clotrimazole-betamethasone (LOTRISONE) cream Apply 1 application topically 2 (two) times daily.   Fiber POWD Take by mouth See admin instructions. Mix 1 rounded teaspoonful into 6-8 ounces of water and drink three times a day   fluticasone (FLONASE) 50 MCG/ACT nasal spray PLACE 2 SPRAYS INTO BOTH NOSTRILS DAILY AS NEEDED FOR ALLERGIES.   glucose blood (ACCU-CHEK GUIDE) test strip Use to check blood sugar 1 time daily   loratadine (CLARITIN) 10 MG tablet Take 10 mg by mouth daily.    Multiple Vitamins-Minerals (ONE-A-DAY WOMENS 50+ ADVANTAGE) TABS Take 1 tablet by mouth daily with breakfast.   Omega-3 Fatty Acids (FISH OIL) 1000 MG CAPS 1 capsule with a meal   oxybutynin (DITROPAN) 5 MG tablet Take 1 tablet (5 mg total) by mouth at bedtime.   polyethylene glycol (MIRALAX / GLYCOLAX) packet Take 17 g by mouth daily as needed for mild constipation (MIX AND DRINK).    Propylene Glycol (SYSTANE BALANCE) 0.6 % SOLN Place 1-2 drops into both eyes daily.   ramipril (ALTACE) 5 MG capsule TAKE 1 CAPSULE (5 MG TOTAL) BY MOUTH DAILY.   triamcinolone cream (KENALOG) 0.1 % Apply 1 application topically 2 (two) times daily.   No facility-administered encounter medications on file as of 10/13/2020.    Allergies (verified) Codeine, Other, and Statins  History: Past Medical History:  Diagnosis Date   Acute pharyngitis    Acute sinusitis, unspecified    Acute upper respiratory infections of unspecified site    Allergy    Anal and rectal polyp    Cataract    History of   Cavernous angioma    Degenerative cervical disc 02/2006   Diabetes mellitus    Type II   GERD (gastroesophageal reflux disease)    Gout    Hemangioma of unspecified site    Hyperlipidemia    Hypertension    Orthostatic hypotension 04/2006   Hospital    Osteoarthritis    Osteopenia    Other abscess of vulva    Palpitations    Syncope and collapse    1 episode   Past Surgical History:  Procedure Laterality Date   ABDOMINAL HYSTERECTOMY     Aortic dopplers     Mild plaque.  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   Activity limited secondary to knee discomfort/arthritis, but going to church exersice group two times a week.             Social Determinants of Health   Financial Resource Strain: Low Risk    Difficulty of Paying Living Expenses: Not hard at all  Food Insecurity: No Food Insecurity   Worried About Charity fundraiser in the Last Year: Never true   [place] in the Last Year: Never true  Transportation Needs: No Transportation Needs   Lack of Transportation (Medical): No   Lack of Transportation (Non-Medical): No  Physical Activity: Inactive   Days of Exercise per Week: 0 days   Minutes of Exercise per Session: 0 min  Stress: No Stress Concern Present    Feeling of Stress : Not at all  Social Connections: Not on file    Tobacco Counseling Counseling given: Not Answered Tobacco comments: quit 1970   Clinical Intake:  Pre-visit preparation completed: Yes  Pain : 0-10 Pain Type: Chronic pain Pain Location: Shoulder Pain Orientation: Left, Right Pain Descriptors / Indicators: Aching Pain Onset: More than a month ago Pain Frequency: Intermittent     Nutritional Risks: None (burping sometimes) Diabetes: Yes CBG done?: No Did pt.  bring in CBG monitor from home?: No  How often do you need to have someone help you when you read instructions, pamphlets, or other written materials from your doctor or pharmacy?: 1 - Never  Diabetic: Yes Nutrition Risk Assessment:  Has the patient had any N/V/D within the last 2 months?  No  Does the patient have any non-healing wounds?  No  Has the patient had any unintentional weight loss or weight gain?  No   Diabetes:  Is the patient diabetic?  Yes  If diabetic, was a CBG obtained today?  No  telephone visit Did the patient bring in their glucometer from home?  No  telephone visit How often do you monitor your CBG's? sometimes.   Financial Strains and Diabetes Management:  Are you having any financial strains with the device, your supplies or your medication? No .  Does the patient want to be seen by Chronic Care Management for management of their diabetes?  No  Would the patient like to be referred to a Nutritionist or for Diabetic Management?  No   Diabetic Exams:  Diabetic Eye Exam: Completed 11/23/2019 Diabetic Foot Exam: Overdue, Pt has been advised about the importance in completing this exam.  Interpreter Needed?: No  Information entered by :: CJohnson, LPN   Activities of Daily Living In your present state of health, do you have any difficulty performing the following activities: 10/13/2020  Hearing? Y  Comment wears hearing aids  Vision? N  Difficulty concentrating or  making decisions? N  Walking or climbing stairs? N  Dressing or bathing? N  Doing errands, shopping? N  Preparing Food and eating ? N  Using the Toilet? N  In the past six months, have you accidently leaked urine? Y  Comment wears depends  Do you have problems with loss of bowel control? N  Managing your Medications? N  Managing your Finances? N  Housekeeping or managing your Housekeeping? N  Some recent data might be hidden    Patient Care Team: [name], MD as PCP - General  Indicate any recent Medical Services you may have received from other than [facility] providers in the past year (date may be approximate).     Assessment:   This is a routine wellness examination for [place].  Hearing/Vision screen Vision Screening - Comments:: Patient gets annual eye exams   Dietary issues and exercise activities discussed: Current Exercise Habits: The patient does not participate in regular exercise at present, Exercise limited by: None identified   Goals Addressed             This Visit's Progress    Patient Stated       10/13/2020, I will maintain and continue medications as prescribed.         Depression Screen PHQ 2/9 Scores 10/13/2020 02/28/2019 02/13/2018 02/12/2017 02/10/2016 01/15/2015 10/07/2013  PHQ - 2 Score 2 0 2 3 0 0 0  PHQ- 9 Score 2 - 2 6 - - -    Fall Risk Fall Risk  10/13/2020 02/28/2019 03/08/2018 02/13/2018 02/12/2017  Falls in the past year? 0 0 0 Yes Yes  Comment - - Emmi Telephone Survey: data to providers prior to load pt fell while walking into church; denies injury tripped while walking into church; lost balance when entering door at home  Number falls in past yr: 0 - - 1 2 or more  Injury with Fall? 0 - - No Yes  Risk for fall due to : Medication side  effect;Impaired balance/gait - - History of fall(s);Impaired balance/gait -  Follow up Falls evaluation completed;Falls prevention discussed - - - -    FALL RISK PREVENTION PERTAINING TO THE  HOME:  Any stairs in or around the home? Yes  If so, are there any without handrails? No  Home free of loose throw rugs in walkways, pet beds, electrical cords, etc? Yes  Adequate lighting in your home to reduce risk of falls? Yes   ASSISTIVE DEVICES UTILIZED TO PREVENT FALLS:  Life alert? No  Use of a cane, walker or w/c? Yes  Grab bars in the bathroom? No  Shower chair or bench in shower? No  Elevated toilet seat or a handicapped toilet? No   TIMED UP AND GO:  Was the test performed?  N/A telephone visit .    Cognitive Function: MMSE - Mini Mental State Exam 10/13/2020 02/13/2018 02/12/2017  Orientation to time 4 5 5   Orientation to Place 5 5 5   Registration 3 3 3   Attention/ Calculation 5 0 0  Recall 3 3 3   Language- name 2 objects - 0 0  Language- repeat 1 1 1   Language- follow 3 step command - 3 3  Language- read & follow direction - 0 0  Write a sentence - 0 0  Copy design - 0 0  Total score - 20 20  Mini Cog  Mini-Cog screen was completed. Maximum score is 22. A value of 0 denotes this part of the MMSE was not completed or the patient failed this part of the Mini-Cog screening.       Immunizations Immunization History  Administered Date(s) Administered   Fluad Quad(high Dose 65+) 03/03/2020   Influenza Split 01/16/2012   Influenza Whole 01/27/2003, 01/25/2007, 02/14/2011   Influenza, High Dose Seasonal PF 01/14/2014, 01/18/2016, 01/29/2017, 12/14/2017, 01/15/2019   Influenza,inj,Quad PF,6+ Mos 01/19/2015   PFIZER(Purple Top)SARS-COV-2 Vaccination 05/13/2019, 06/01/2019, 03/15/2020   Pneumococcal Conjugate-13 10/07/2013   Pneumococcal Polysaccharide-23 02/22/1994   Td 04/24/1997, 10/29/2009   Zoster, Live 02/28/2008    TDAP status: Due, Education has been provided regarding the importance of this vaccine. Advised may receive this vaccine at local pharmacy or Health Dept. Aware to provide a copy of the vaccination record if obtained from local pharmacy or  Health Dept. Verbalized acceptance and understanding.  Flu Vaccine status: Up to date  Pneumococcal vaccine status: Up to date  Covid-19 vaccine status: Completed 3 vaccines.   Qualifies for Shingles Vaccine? Yes   Zostavax completed Yes   Shingrix Completed?: No.    Education has been provided regarding the importance of this vaccine. Patient has been advised to call insurance company to determine out of pocket expense if they have not yet received this vaccine. Advised may also receive vaccine at local pharmacy or Health Dept. Verbalized acceptance and understanding.  Screening Tests Health Maintenance  Topic Date Due   Zoster Vaccines- Shingrix (1 of 2) Never done   COVID-19 Vaccine (4 - Booster for Pfizer series) 06/15/2020   FOOT EXAM  07/27/2020   HEMOGLOBIN A1C  08/23/2020   TETANUS/TDAP  10/14/2023 (Originally 10/30/2019)   INFLUENZA VACCINE  11/22/2020   OPHTHALMOLOGY EXAM  11/22/2020   DEXA SCAN  Completed   PNA vac Low Risk Adult  Completed   HPV VACCINES  Aged Out    Health Maintenance  Health Maintenance Due  Topic Date Due   Zoster Vaccines- Shingrix (1 of 2) Never done   COVID-19 Vaccine (4 - Booster for [employer] series) 06/15/2020  FOOT EXAM  07/27/2020   HEMOGLOBIN A1C  08/23/2020    Colorectal cancer screening: No longer required.   Mammogram status: No longer required due to age.  Bone Density status: declined  Lung Cancer Screening: (Low Dose CT Chest recommended if Age 21-80 years, 30 pack-year currently smoking OR have quit w/in 15years.) does not qualify.    Additional Screening:  Hepatitis C Screening: does not qualify; Completed N/A  Vision Screening: Recommended annual ophthalmology exams for early detection of glaucoma and other disorders of the eye. Is the patient up to date with their annual eye exam?  Yes  Who is the provider or what is the name of the office in which the patient attends annual eye exams? Can't remember name  If pt is  not established with a provider, would they like to be referred to a provider to establish care? No .   Dental Screening: Recommended annual dental exams for proper oral hygiene  Community Resource Referral / Chronic Care Management: CRR required this visit?  No   CCM required this visit?  No      Plan:     I have personally reviewed and noted the following in the patient's chart:   Medical and social history Use of alcohol, tobacco or illicit drugs  Current medications and supplements including opioid prescriptions.  Functional ability and status Nutritional status Physical activity Advanced directives List of other physicians Hospitalizations, surgeries, and ER visits in previous 12 months Vitals Screenings to include cognitive, depression, and falls Referrals and appointments  In addition, I have reviewed and discussed with patient certain preventive protocols, quality metrics, and best practice recommendations. A written personalized care plan for preventive services as well as general preventive health recommendations were provided to patient.   Due to this being a telephonic visit, the after visit summary with patients personalized plan was offered to patient via office or my-chart. Patient preferred to pick up at office at next visit or via mychart.   Andrez Grime, LPN   01/03/2582

## 2020-10-13 NOTE — Telephone Encounter (Signed)
Per last phone call: She states she gets up 5 to 6 times a night to go to the bathroom.

## 2020-10-13 NOTE — Progress Notes (Signed)
Okay.. sent in medication to use before bed.. have her make appt to discuss response in 3-4 weeks.

## 2020-10-13 NOTE — Progress Notes (Signed)
[name] notified as instructed by telephone.  Follow up appointment scheduled 11/09/2020 at 4:00 pm with Dr. Diona Browner.

## 2020-10-13 NOTE — Patient Instructions (Signed)
Ms. [name] , Thank you for taking time to come for your Medicare Wellness Visit. I appreciate your ongoing commitment to your health goals. Please review the following plan we discussed and let me know if I can assist you in the future.   Screening recommendations/referrals: Colonoscopy: no longer required  Mammogram: no longer required  Bone Density: declined Recommended yearly ophthalmology/optometry visit for glaucoma screening and checkup Recommended yearly dental visit for hygiene and checkup  Vaccinations: Influenza vaccine: Up to date, completed 03/03/2020, due 11/2020 Pneumococcal vaccine: Completed series Tdap vaccine: decline-insurance  Shingles vaccine: due, check with your insurance regarding coverage if interested    Covid-19:completed 3 vaccines   Advanced directives: .Please bring a copy of your POA (Power of [place]) and/or Living Will to your next appointment.   Conditions/risks identified: diabetes, hyperlipidemia   Next appointment: Follow up in one year for your annual wellness visit    Preventive Care 65 Years and Older, Female Preventive care refers to lifestyle choices and visits with your health care provider that can promote health and wellness. What does preventive care include? A yearly physical exam. This is also called an annual well check. Dental exams once or twice a year. Routine eye exams. Ask your health care provider how often you should have your eyes checked. Personal lifestyle choices, including: Daily care of your teeth and gums. Regular physical activity. Eating a healthy diet. Avoiding tobacco and drug use. Limiting alcohol use. Practicing safe sex. Taking low-dose aspirin every day. Taking vitamin and mineral supplements as recommended by your health care provider. What happens during an annual well check? The services and screenings done by your health care provider during your annual well check will depend on your age, overall  health, lifestyle risk factors, and family history of disease. Counseling  Your health care provider may ask you questions about your: Alcohol use. Tobacco use. Drug use. Emotional well-being. Home and relationship well-being. Sexual activity. Eating habits. History of falls. Memory and ability to understand (cognition). Work and work Statistician. Reproductive health. Screening  You may have the following tests or measurements: Height, weight, and BMI. Blood pressure. Lipid and cholesterol levels. These may be checked every 5 years, or more frequently if you are over 78 years old. Skin check. Lung cancer screening. You may have this screening every year starting at age 25 if you have a 30-pack-year history of smoking and currently smoke or have quit within the past 15 years. Fecal occult blood test (FOBT) of the stool. You may have this test every year starting at age 19. Flexible sigmoidoscopy or colonoscopy. You may have a sigmoidoscopy every 5 years or a colonoscopy every 10 years starting at age 38. Hepatitis C blood test. Hepatitis B blood test. Sexually transmitted disease (STD) testing. Diabetes screening. This is done by checking your blood sugar (glucose) after you have not eaten for a while (fasting). You may have this done every 1-3 years. Bone density scan. This is done to screen for osteoporosis. You may have this done starting at age 67. Mammogram. This may be done every 1-2 years. Talk to your health care provider about how often you should have regular mammograms. Talk with your health care provider about your test results, treatment options, and if necessary, the need for more tests. Vaccines  Your health care provider may recommend certain vaccines, such as: Influenza vaccine. This is recommended every year. Tetanus, diphtheria, and acellular pertussis (Tdap, Td) vaccine. You may need a Td booster every 10  years. Zoster vaccine. You may need this after age  15. Pneumococcal 13-valent conjugate (PCV13) vaccine. One dose is recommended after age 63. Pneumococcal polysaccharide (PPSV23) vaccine. One dose is recommended after age 49. Talk to your health care provider about which screenings and vaccines you need and how often you need them. This information is not intended to replace advice given to you by your health care provider. Make sure you discuss any questions you have with your health care provider. Document Released: 05/07/2015 Document Revised: 12/29/2015 Document Reviewed: 02/09/2015 Elsevier Interactive Patient Education  2017 [place] Prevention in the Home Falls can cause injuries. They can happen to people of all ages. There are many things you can do to make your home safe and to help prevent falls. What can I do on the outside of my home? Regularly fix the edges of walkways and driveways and fix any cracks. Remove anything that might make you trip as you walk through a door, such as a raised step or threshold. Trim any bushes or trees on the path to your home. Use bright outdoor lighting. Clear any walking paths of anything that might make someone trip, such as rocks or tools. Regularly check to see if handrails are loose or broken. Make sure that both sides of any steps have handrails. Any raised decks and porches should have guardrails on the edges. Have any leaves, snow, or ice cleared regularly. Use sand or salt on walking paths during winter. Clean up any spills in your garage right away. This includes oil or grease spills. What can I do in the bathroom? Use night lights. Install grab bars by the toilet and in the tub and shower. Do not use towel bars as grab bars. Use non-skid mats or decals in the tub or shower. If you need to sit down in the shower, use a plastic, non-slip stool. Keep the floor dry. Clean up any water that spills on the floor as soon as it happens. Remove soap buildup in the tub or shower  regularly. Attach bath mats securely with double-sided non-slip rug tape. Do not have throw rugs and other things on the floor that can make you trip. What can I do in the bedroom? Use night lights. Make sure that you have a light by your bed that is easy to reach. Do not use any sheets or blankets that are too big for your bed. They should not hang down onto the floor. Have a firm chair that has side arms. You can use this for support while you get dressed. Do not have throw rugs and other things on the floor that can make you trip. What can I do in the kitchen? Clean up any spills right away. Avoid walking on wet floors. Keep items that you use a lot in easy-to-reach places. If you need to reach something above you, use a strong step stool that has a grab bar. Keep electrical cords out of the way. Do not use floor polish or wax that makes floors slippery. If you must use wax, use non-skid floor wax. Do not have throw rugs and other things on the floor that can make you trip. What can I do with my stairs? Do not leave any items on the stairs. Make sure that there are handrails on both sides of the stairs and use them. Fix handrails that are broken or loose. Make sure that handrails are as long as the stairways. Check any carpeting to make sure  that it is firmly attached to the stairs. Fix any carpet that is loose or worn. Avoid having throw rugs at the top or bottom of the stairs. If you do have throw rugs, attach them to the floor with carpet tape. Make sure that you have a light switch at the top of the stairs and the bottom of the stairs. If you do not have them, ask someone to add them for you. What else can I do to help prevent falls? Wear shoes that: Do not have high heels. Have rubber bottoms. Are comfortable and fit you well. Are closed at the toe. Do not wear sandals. If you use a stepladder: Make sure that it is fully opened. Do not climb a closed stepladder. Make sure that  both sides of the stepladder are locked into place. Ask someone to hold it for you, if possible. Clearly mark and make sure that you can see: Any grab bars or handrails. First and last steps. Where the edge of each step is. Use tools that help you move around (mobility aids) if they are needed. These include: Canes. Walkers. Scooters. Crutches. Turn on the lights when you go into a dark area. Replace any light bulbs as soon as they burn out. Set up your furniture so you have a clear path. Avoid moving your furniture around. If any of your floors are uneven, fix them. If there are any pets around you, be aware of where they are. Review your medicines with your doctor. Some medicines can make you feel dizzy. This can increase your chance of falling. Ask your doctor what other things that you can do to help prevent falls. This information is not intended to replace advice given to you by your health care provider. Make sure you discuss any questions you have with your health care provider. Document Released: 02/04/2009 Document Revised: 09/16/2015 Document Reviewed: 05/15/2014 Elsevier Interactive Patient Education  2017 [employer].

## 2020-10-13 NOTE — Progress Notes (Signed)
PCP notes:  Health Maintenance: Dexa- declined Foot exam- due Tdap- insurance Shingrix- due    Abnormal Screenings: none   Patient concerns: Left foot swollen    Nurse concerns: none   Next PCP appt.: none

## 2020-10-18 NOTE — Telephone Encounter (Signed)
She stated that the medication for her UTI its making her sleepy, she started it 4 days ago. She stated that she didn't take one last night and wanted to know about what to do it she should still take it or not. She stated she get sleepy around lunch time but not at night. She took her BP while on the phone with me and it read 178/95 pulse is 84.

## 2020-10-18 NOTE — Telephone Encounter (Signed)
[name] notified as instructed by telephone.  Patient states she did notice some improvement.  She will restart medication tonight and give it more time.  She will continue to follow up blood pressure and call us back by the end of the week if her numbers remain high.

## 2020-10-23 DIAGNOSIS — R350 Frequency of micturition: Secondary | ICD-10-CM | POA: Diagnosis not present

## 2020-10-23 DIAGNOSIS — R251 Tremor, unspecified: Secondary | ICD-10-CM | POA: Diagnosis not present

## 2020-10-23 DIAGNOSIS — R9431 Abnormal electrocardiogram [ECG] [EKG]: Secondary | ICD-10-CM | POA: Diagnosis not present

## 2020-10-25 DIAGNOSIS — I517 Cardiomegaly: Secondary | ICD-10-CM | POA: Diagnosis not present

## 2020-10-26 ENCOUNTER — Telehealth: Payer: Self-pay

## 2020-10-26 NOTE — Telephone Encounter (Signed)
[name] Night - Client TELEPHONE ADVICE RECORD [facility] Patient Name: [name] Gender: Female DOB: Jun 15, 1922 Age: 87 Y 9 M 16 D Return Phone Number: [phone] (Primary), [phone] (Secondary) Address: City/ State/ Zip: [place]  [address] Client [facility] Night - Client Client Site [place] Physician Eliezer Lofts - MD Contact Type Call Who Is Calling Patient / Member / Family / Caregiver Call Type Triage / Clinical Caller Name [name] Relationship To Patient Daughter Return Phone Number [phone] (Primary) Chief Complaint Medication reaction Reason for Call Symptomatic / Request for [name] states her mom called her around 1230 because she was cold and shaky. She is on new medication. She had taking her bs and it was 118. They took her blood pressure which was 182/116. They called 911 and blood sugar was 90 when they came. The medicine is making her sleepy and not clear minded. She didn't eat lunch when she usually does. She did eat and had protein shake and meatloaf sandwich. Her blood pressure is 155/89 and blood sugar 173. The medication is Oxybutynin 1 at bedtime. Should she stop taking it or take 1/2 tablet? This is happening when she only started taking this medication. Translation No Nurse Assessment Nurse: Lissa Hoard, RN, [place] Date/Time [name] Time): 10/23/2020 3:06:59 PM Confirm and document reason for call. If symptomatic, describe symptoms. ---caller states that she was prescribed oxybutynin about a week ago for nocturia; feels sleepy with medication, called and was told that it is a side effect of the medication; felt cold and shaky today; BS 118, BP 182/116 at 140pm today, then called for ambulance, BS was 90 when they came and BP was about the same; refused transport, had lunch and protein shake, BS  173, BP 155/89; felt like she needed to breathe through her mouth; denies other symptoms Does the patient have any new or worsening symptoms? ---Yes Will a triage be completed? ---Yes Related visit to physician within the last 2 weeks? ---No Does the PT have any chronic conditions? (i.e. diabetes, asthma, this includes High risk factors for pregnancy, etc.) ---Yes List chronic conditions. ---HTN, ramipril diabetes PLEASE NOTE: All timestamps contained within this report are represented as [place] Standard Time. CONFIDENTIALTY NOTICE: This fax transmission is intended only for the addressee. It contains information that is legally privileged, confidential or otherwise protected from use or disclosure. If you are not the intended recipient, you are strictly prohibited from reviewing, disclosing, copying using or disseminating any of this information or taking any action in reliance on or regarding this information. If you have received this fax in error, please notify us immediately by telephone so that we can arrange for its return to [place]. Phone: [phone], Toll-Free: [phone], Fax: [phone] Page: 2 of 3 Call Id: [id number] Nurse Assessment Is this a behavioral health or substance abuse call? ---No Guidelines Guideline Title Affirmed Question Affirmed Notes Nurse Date/Time [name] Time) Blood Pressure - High [5] Systolic BP >= 732 OR Diastolic >= 80 AND [2] taking BP medications Talbert Nan 10/23/2020 3:13:15 PM Disp. Time [name] Time) Disposition Final User 10/23/2020 3:26:19 PM Called On-Call Provider Lissa Hoard, RN, [place] 10/23/2020 3:21:00 PM See PCP within 24 Hours Yes Lissa Hoard, RN, [place] Disposition Overriden: See PCP within 2 Weeks Override Reason: Patient's symptoms need a higher level of care Caller Disagree/Comply Comply Caller Understands Yes PreDisposition InappropriateToAsk Care Advice Given Per Guideline SEE PCP WITHIN 2 WEEKS:  SEE PCP WITHIN 24 HOURS:  * IF OFFICE WILL BE CLOSED: You need to be seen within the next 24 hours. A clinic or an urgent care center is often a good source of care if your doctor's office is closed or you can't get an appointment. HIGH BLOOD PRESSURE: * The goal of blood pressure treatment for most people with hypertension is to keep the blood pressure under 140/90. For people that are 60 years or older, your doctor may instead want to keep the blood pressure under 150/90. CALL BACK IF: * Weakness or numbness of the face, arm or leg on one side of the body occurs * Difficulty walking, difficulty talking, or severe headache occurs * Chest pain or difficulty breathing occurs * You become worse CARE ADVICE given per High Blood Pressure (Adult) guideline. Comments User: [name] Gab, RN Date/Time (Eastern Time): 10/23/2020 3:19:27 PM fireman was the responder on the scene, not an ambulance-decided to eat instead to maintain blood sugar User: [name] Gab, RN Date/Time (Eastern Time): 10/23/2020 3:20:04 PM states that she feels very tired and thinks it is because of her new medication User: [name] Gab, RN Date/Time (Eastern Time): 10/23/2020 3:21:45 PM diet controlled diabetic-not on meds now User: [name] Gab, RN Date/Time ([employer] Time): 10/23/2020 3:30:46 PM caller updated on provider response/recommendation; caller states will be taking her mother to UC near them and verbalized understanding PLEASE NOTE: All timestamps contained within this report are represented as [place] Standard Time. CONFIDENTIALTY NOTICE: This fax transmission is intended only for the addressee. It contains information that is legally privileged, confidential or otherwise protected from use or disclosure. If you are not the intended recipient, you are strictly prohibited from reviewing, disclosing, copying using or disseminating any of this information or taking any action in reliance on or regarding this information. If you have received  this fax in error, please notify us immediately by telephone so that we can arrange for its return to [place]. Phone: [phone], Toll-Free: [phone], Fax: [phone] Page: 3 of 3 Call Id: [id number] Referrals GO TO FACILITY UNDECIDED Paging DoctorName Phone DateTime Result/ Outcome Message Type Notes Orlie Dakin MD [phone] 10/23/2020 3:26:19 PM Called On Call Provider - Reached Doctor Paged Arlester Marker "Lennox Grumbles"- MD 10/23/2020 3:27:48 PM Spoke with On Call - General Message Result report given to provider; to ER or UC for eval, may continue, 1/2 or hold oxybutyin medication since it is unrelated to other symptoms and follow up with PCP on tuesday; RN to update caller

## 2020-10-27 ENCOUNTER — Ambulatory Visit (INDEPENDENT_AMBULATORY_CARE_PROVIDER_SITE_OTHER): Payer: Medicare Other | Admitting: Family Medicine

## 2020-10-27 ENCOUNTER — Other Ambulatory Visit: Payer: Self-pay

## 2020-10-27 ENCOUNTER — Encounter: Payer: Self-pay | Admitting: Family Medicine

## 2020-10-27 VITALS — BP 130/82 | HR 81 | Temp 97.9°F | Ht 63.0 in | Wt 127.0 lb

## 2020-10-27 DIAGNOSIS — D72829 Elevated white blood cell count, unspecified: Secondary | ICD-10-CM

## 2020-10-27 DIAGNOSIS — R351 Nocturia: Secondary | ICD-10-CM | POA: Diagnosis not present

## 2020-10-27 DIAGNOSIS — R251 Tremor, unspecified: Secondary | ICD-10-CM

## 2020-10-27 LAB — CBC WITH DIFFERENTIAL/PLATELET
Basophils Absolute: 0 10*3/uL (ref 0.0–0.1)
Basophils Relative: 0.4 % (ref 0.0–3.0)
Eosinophils Absolute: 0.3 10*3/uL (ref 0.0–0.7)
Eosinophils Relative: 2.5 % (ref 0.0–5.0)
HCT: 38 % (ref 36.0–46.0)
Hemoglobin: 12.6 g/dL (ref 12.0–15.0)
Lymphocytes Relative: 11.3 % — ABNORMAL LOW (ref 12.0–46.0)
Lymphs Abs: 1.3 10*3/uL (ref 0.7–4.0)
MCHC: 33.1 g/dL (ref 30.0–36.0)
MCV: 94.8 fl (ref 78.0–100.0)
Monocytes Absolute: 1 10*3/uL (ref 0.1–1.0)
Monocytes Relative: 9.2 % (ref 3.0–12.0)
Neutro Abs: 8.5 10*3/uL — ABNORMAL HIGH (ref 1.4–7.7)
Neutrophils Relative %: 76.6 % (ref 43.0–77.0)
Platelets: 301 10*3/uL (ref 150.0–400.0)
RBC: 4.01 Mil/uL (ref 3.87–5.11)
RDW: 14.8 % (ref 11.5–15.5)
WBC: 11.2 10*3/uL — ABNORMAL HIGH (ref 4.0–10.5)

## 2020-10-27 NOTE — Assessment & Plan Note (Signed)
May be related to med SE versus sensitivity to lower normal blood sugar/blood sugar change.  Encouraged her to eat protein  With eat meal and not to go > 5 hours without eating.

## 2020-10-27 NOTE — Progress Notes (Signed)
Patient ID: [name], female    DOB: 1922-09-01, 87 y.o.   MRN: [id number]  This visit was conducted in person.  BP 130/82   Pulse 81   Temp 97.9 F (36.6 C) (Temporal)   Ht _0  (1.6 m)   Wt 127 lb (57.6 kg)   SpO2 96%   BMI 22.50 kg/m    CC: Chief Complaint  Patient presents with   Follow-up    Urgent Care 10/23/20-See Care Everywhere. Had elevated WBC 13.1    Subjective:   HPI: [name] is a 87 y.o. female presenting on 10/27/2020 for Follow-up (Urgent Care 10/23/20-See Care Everywhere. Had elevated WBC 13.1)  Reviewed Urgent Care note form 10/23/2020  She had been feeling shaky, cbg 118, later 90.. she is sensitive to sugars at this level.. felt better   BP elevated 170-180/90-111  Glucose , EKG and UA were normal.  CMP na 133, cr 1.24  CBC: wbc 13.1, hg 12.4  She has recently started on oxybutynin.. feeling tired with this, even with low dose. Does help decrease nocturia from 6 times a t night to 2 times a night.    She reports she feel better now. No shakiness.  BP now running normal.  Not on any DM meds.  No new cough, no new rash, no tick bites. No change in SOB, no dysuria, no ear pain, no ST.  No diarrhea now... did have one day las week.  No abd pain  BP Readings from Last 3 Encounters:  10/27/20 130/82  09/17/20 110/60  06/22/20 104/60     Relevant past medical, surgical, family and social history reviewed and updated as indicated. Interim medical history since our last visit reviewed. Allergies and medications reviewed and updated. Outpatient Medications Prior to Visit  Medication Sig Dispense Refill   Accu-Chek Softclix Lancets lancets CHECK BLOOD SUGAR 1 TIME A DAY. DX: E11.9 100 each 3   acetaminophen (TYLENOL) 325 MG tablet Take 650 mg by mouth 2 (two) times daily.     aspirin EC 81 MG tablet Take 81 mg by mouth at bedtime.      Blood Glucose Monitoring Suppl (ACCU-CHEK GUIDE) w/Device KIT 1 each by Does not apply route daily. Use  to check blood sugar daily as needed.  Dx: E11.9 1 kit 0   clotrimazole-betamethasone (LOTRISONE) cream Apply 1 application topically 2 (two) times daily. 30 g 1   Fiber POWD Take by mouth See admin instructions. Mix 1 rounded teaspoonful into 6-8 ounces of water and drink three times a day     fluticasone (FLONASE) 50 MCG/ACT nasal spray PLACE 2 SPRAYS INTO BOTH NOSTRILS DAILY AS NEEDED FOR ALLERGIES. 48 mL 1   glucose blood (ACCU-CHEK GUIDE) test strip Use to check blood sugar 1 time daily 100 each 3   loratadine (CLARITIN) 10 MG tablet Take 10 mg by mouth daily.      Multiple Vitamins-Minerals (ONE-A-DAY WOMENS 50+ ADVANTAGE) TABS Take 1 tablet by mouth daily with breakfast.     Omega-3 Fatty Acids (FISH OIL) 1000 MG CAPS 1 capsule with a meal     polyethylene glycol (MIRALAX / GLYCOLAX) packet Take 17 g by mouth daily as needed for mild constipation (MIX AND DRINK).      Propylene Glycol (SYSTANE BALANCE) 0.6 % SOLN Place 1-2 drops into both eyes daily.     ramipril (ALTACE) 5 MG capsule TAKE 1 CAPSULE (5 MG TOTAL) BY MOUTH DAILY. 90 capsule 1  triamcinolone cream (KENALOG) 0.1 % Apply 1 application topically 2 (two) times daily. 30 g 0   oxybutynin (DITROPAN) 5 MG tablet Take 1 tablet (5 mg total) by mouth at bedtime. (Patient not taking: Reported on 10/27/2020) 30 tablet 11   No facility-administered medications prior to visit.     Per HPI unless specifically indicated in ROS section below Review of Systems  Constitutional:  Negative for fatigue and fever.  HENT:  Negative for ear pain.   Eyes:  Negative for pain.  Respiratory:  Negative for chest tightness and shortness of breath.   Cardiovascular:  Negative for chest pain, palpitations and leg swelling.  Gastrointestinal:  Negative for abdominal pain.  Genitourinary:  Negative for dysuria.  Objective:  BP 130/82   Pulse 81   Temp 97.9 F (36.6 C) (Temporal)   Ht _0  (1.6 m)   Wt 127 lb (57.6 kg)   SpO2 96%   BMI 22.50 kg/m    Wt Readings from Last 3 Encounters:  10/27/20 127 lb (57.6 kg)  09/17/20 127 lb 12 oz (57.9 kg)  06/22/20 122 lb 8 oz (55.6 kg)      Physical Exam Constitutional:      General: She is not in acute distress.    Appearance: Normal appearance. She is well-developed. She is not ill-appearing or toxic-appearing.  HENT:     Head: Normocephalic.     Right Ear: Hearing, tympanic membrane, ear canal and external ear normal. Tympanic membrane is not erythematous, retracted or bulging.     Left Ear: Hearing, tympanic membrane, ear canal and external ear normal. Tympanic membrane is not erythematous, retracted or bulging.     Nose: No mucosal edema or rhinorrhea.     Right Sinus: No maxillary sinus tenderness or frontal sinus tenderness.     Left Sinus: No maxillary sinus tenderness or frontal sinus tenderness.     Mouth/Throat:     Pharynx: Uvula midline.  Eyes:     General: Lids are normal. Lids are everted, no foreign bodies appreciated.     Conjunctiva/sclera: Conjunctivae normal.     Pupils: Pupils are equal, round, and reactive to light.  Neck:     Thyroid: No thyroid mass or thyromegaly.     Vascular: No carotid bruit.     Trachea: Trachea normal.  Cardiovascular:     Rate and Rhythm: Normal rate and regular rhythm.     Pulses: Normal pulses.     Heart sounds: Normal heart sounds, S1 normal and S2 normal. No murmur heard.   No friction rub. No gallop.  Pulmonary:     Effort: Pulmonary effort is normal. No tachypnea or respiratory distress.     Breath sounds: Normal breath sounds. No decreased breath sounds, wheezing, rhonchi or rales.  Abdominal:     General: Bowel sounds are normal.     Palpations: Abdomen is soft.     Tenderness: There is no abdominal tenderness.  Musculoskeletal:     Cervical back: Normal range of motion and neck supple.  Skin:    General: Skin is warm and dry.     Findings: No rash.  Neurological:     Mental Status: She is alert and oriented to person,  place, and time.     GCS: GCS eye subscore is 4. GCS verbal subscore is 5. GCS motor subscore is 6.     Cranial Nerves: No cranial nerve deficit.     Sensory: No sensory deficit.     Motor:  No abnormal muscle tone.     Coordination: Coordination normal.     Gait: Gait normal.     Deep Tendon Reflexes: Reflexes are normal and symmetric.     Comments: Nml cerebellar exam   No papilledema  Psychiatric:        Mood and Affect: Mood is not anxious or depressed.        Speech: Speech normal.        Behavior: Behavior normal. Behavior is cooperative.        Thought Content: Thought content normal.        Cognition and Memory: Memory is not impaired. She does not exhibit impaired recent memory or impaired remote memory.        Judgment: Judgment normal.      Results for orders placed or performed in visit on 05/21/20  Urine Culture   Specimen: Urine  Result Value Ref Range   MICRO NUMBER: [id number]    SPECIMEN QUALITY: Adequate    Sample Source NOT GIVEN    STATUS: FINAL    Result:      Less than 10,000 CFU/mL of single Gram negative organism isolated. No further testing will be performed. If clinically indicated, recollection using a method to minimize contamination, with prompt transfer to Urine Culture Transport Tube, is recommended.  POCT Urinalysis Dipstick (Automated)  Result Value Ref Range   Color, UA Yellow    Clarity, UA Clear    Glucose, UA Negative Negative   Bilirubin, UA Negative    Ketones, UA Negative    Spec Grav, UA 1.020 1.010 - 1.025   Blood, UA 25 Ery/uL    pH, UA 6.0 5.0 - 8.0   Protein, UA Negative Negative   Urobilinogen, UA 0.2 0.2 or 1.0 E.U./dL   Nitrite, UA Negative    Leukocytes, UA Negative Negative    This visit occurred during the SARS-CoV-2 public health emergency.  Safety protocols were in place, including screening questions prior to the visit, additional usage of staff PPE, and extensive cleaning of exam room while observing appropriate contact  time as indicated for disinfecting solutions.   COVID 19 screen:  No recent travel or known exposure to COVID19 The patient denies respiratory symptoms of COVID 19 at this time. The importance of social distancing was discussed today.   Assessment and Plan Problem List Items Addressed This Visit     Leukocytosis - Primary    No S/S of infeciton. Repeat today.  Has been slightly elevated in past.. trending up.       Relevant Orders   CBC with Differential/Platelet   Nocturia    Pt reports improvement of nocturia with oxybutynin, but cannot tolerate  More that every few days as it cause fuzzy-headedness and sedation.  If sedation not improved using 2-3 days a week... will consider complete prior authorization for myrbetriq.       Shakiness    May be related to med SE versus sensitivity to lower normal blood sugar/blood sugar change.  Encouraged her to eat protein  With eat meal and not to go > 5 hours without eating.       Relevant Orders   CBC with Differential/Platelet       Eliezer Lofts, MD

## 2020-10-27 NOTE — Patient Instructions (Signed)
Please stop at the lab to have labs drawn. Okay to use oxybutynin intermittently for nocturia.

## 2020-10-27 NOTE — Assessment & Plan Note (Addendum)
No S/S of infeciton. Repeat today.  Has been slightly elevated in past.. trending up.

## 2020-10-27 NOTE — Assessment & Plan Note (Signed)
Pt reports improvement of nocturia with oxybutynin, but cannot tolerate  More that every few days as it cause fuzzy-headedness and sedation.  If sedation not improved using 2-3 days a week... will consider complete prior authorization for myrbetriq.

## 2020-10-29 ENCOUNTER — Other Ambulatory Visit: Payer: Self-pay

## 2020-10-29 ENCOUNTER — Emergency Department (HOSPITAL_COMMUNITY): Payer: Medicare Other

## 2020-10-29 ENCOUNTER — Inpatient Hospital Stay (HOSPITAL_COMMUNITY)
Admission: EM | Admit: 2020-10-29 | Discharge: 2020-11-03 | DRG: 522 | Disposition: A | Discharge status: Skilled Nursing Facility | Payer: Medicare Other | Attending: Family Medicine | Admitting: Family Medicine

## 2020-10-29 ENCOUNTER — Encounter (HOSPITAL_COMMUNITY): Payer: Self-pay

## 2020-10-29 DIAGNOSIS — W19XXXD Unspecified fall, subsequent encounter: Secondary | ICD-10-CM | POA: Diagnosis not present

## 2020-10-29 DIAGNOSIS — R7989 Other specified abnormal findings of blood chemistry: Secondary | ICD-10-CM | POA: Diagnosis present

## 2020-10-29 DIAGNOSIS — R400 Somnolence: Secondary | ICD-10-CM | POA: Diagnosis not present

## 2020-10-29 DIAGNOSIS — H5789 Other specified disorders of eye and adnexa: Secondary | ICD-10-CM | POA: Diagnosis not present

## 2020-10-29 DIAGNOSIS — S72009A Fracture of unspecified part of neck of unspecified femur, initial encounter for closed fracture: Secondary | ICD-10-CM | POA: Diagnosis present

## 2020-10-29 DIAGNOSIS — S72011A Unspecified intracapsular fracture of right femur, initial encounter for closed fracture: Secondary | ICD-10-CM | POA: Diagnosis not present

## 2020-10-29 DIAGNOSIS — W010XXA Fall on same level from slipping, tripping and stumbling without subsequent striking against object, initial encounter: Secondary | ICD-10-CM | POA: Diagnosis present

## 2020-10-29 DIAGNOSIS — Z7401 Bed confinement status: Secondary | ICD-10-CM | POA: Diagnosis not present

## 2020-10-29 DIAGNOSIS — I10 Essential (primary) hypertension: Secondary | ICD-10-CM | POA: Diagnosis present

## 2020-10-29 DIAGNOSIS — Z7982 Long term (current) use of aspirin: Secondary | ICD-10-CM

## 2020-10-29 DIAGNOSIS — R531 Weakness: Secondary | ICD-10-CM | POA: Diagnosis not present

## 2020-10-29 DIAGNOSIS — K5900 Constipation, unspecified: Secondary | ICD-10-CM | POA: Diagnosis not present

## 2020-10-29 DIAGNOSIS — Z66 Do not resuscitate: Secondary | ICD-10-CM | POA: Diagnosis present

## 2020-10-29 DIAGNOSIS — M7989 Other specified soft tissue disorders: Secondary | ICD-10-CM | POA: Diagnosis not present

## 2020-10-29 DIAGNOSIS — T07XXXA Unspecified multiple injuries, initial encounter: Secondary | ICD-10-CM | POA: Diagnosis not present

## 2020-10-29 DIAGNOSIS — Z20822 Contact with and (suspected) exposure to covid-19: Secondary | ICD-10-CM | POA: Diagnosis not present

## 2020-10-29 DIAGNOSIS — Z888 Allergy status to other drugs, medicaments and biological substances status: Secondary | ICD-10-CM

## 2020-10-29 DIAGNOSIS — M19011 Primary osteoarthritis, right shoulder: Secondary | ICD-10-CM | POA: Diagnosis present

## 2020-10-29 DIAGNOSIS — M80051A Age-related osteoporosis with current pathological fracture, right femur, initial encounter for fracture: Secondary | ICD-10-CM | POA: Diagnosis not present

## 2020-10-29 DIAGNOSIS — Z9181 History of falling: Secondary | ICD-10-CM | POA: Diagnosis not present

## 2020-10-29 DIAGNOSIS — Z96649 Presence of unspecified artificial hip joint: Secondary | ICD-10-CM

## 2020-10-29 DIAGNOSIS — M160 Bilateral primary osteoarthritis of hip: Secondary | ICD-10-CM | POA: Diagnosis not present

## 2020-10-29 DIAGNOSIS — M810 Age-related osteoporosis without current pathological fracture: Secondary | ICD-10-CM | POA: Diagnosis present

## 2020-10-29 DIAGNOSIS — S72041A Displaced fracture of base of neck of right femur, initial encounter for closed fracture: Secondary | ICD-10-CM

## 2020-10-29 DIAGNOSIS — Z4789 Encounter for other orthopedic aftercare: Secondary | ICD-10-CM | POA: Diagnosis not present

## 2020-10-29 DIAGNOSIS — E1122 Type 2 diabetes mellitus with diabetic chronic kidney disease: Secondary | ICD-10-CM | POA: Diagnosis present

## 2020-10-29 DIAGNOSIS — Z96641 Presence of right artificial hip joint: Secondary | ICD-10-CM | POA: Diagnosis not present

## 2020-10-29 DIAGNOSIS — Z96651 Presence of right artificial knee joint: Secondary | ICD-10-CM | POA: Diagnosis present

## 2020-10-29 DIAGNOSIS — Z7901 Long term (current) use of anticoagulants: Secondary | ICD-10-CM | POA: Diagnosis not present

## 2020-10-29 DIAGNOSIS — M109 Gout, unspecified: Secondary | ICD-10-CM | POA: Diagnosis not present

## 2020-10-29 DIAGNOSIS — W19XXXA Unspecified fall, initial encounter: Secondary | ICD-10-CM

## 2020-10-29 DIAGNOSIS — R351 Nocturia: Secondary | ICD-10-CM | POA: Diagnosis present

## 2020-10-29 DIAGNOSIS — M1611 Unilateral primary osteoarthritis, right hip: Secondary | ICD-10-CM | POA: Diagnosis not present

## 2020-10-29 DIAGNOSIS — M62838 Other muscle spasm: Secondary | ICD-10-CM | POA: Diagnosis not present

## 2020-10-29 DIAGNOSIS — N1831 Chronic kidney disease, stage 3a: Secondary | ICD-10-CM | POA: Diagnosis not present

## 2020-10-29 DIAGNOSIS — D72829 Elevated white blood cell count, unspecified: Secondary | ICD-10-CM | POA: Diagnosis present

## 2020-10-29 DIAGNOSIS — S72001A Fracture of unspecified part of neck of right femur, initial encounter for closed fracture: Secondary | ICD-10-CM | POA: Diagnosis present

## 2020-10-29 DIAGNOSIS — M25551 Pain in right hip: Secondary | ICD-10-CM | POA: Diagnosis not present

## 2020-10-29 DIAGNOSIS — I129 Hypertensive chronic kidney disease with stage 1 through stage 4 chronic kidney disease, or unspecified chronic kidney disease: Secondary | ICD-10-CM | POA: Diagnosis not present

## 2020-10-29 DIAGNOSIS — M47816 Spondylosis without myelopathy or radiculopathy, lumbar region: Secondary | ICD-10-CM | POA: Diagnosis not present

## 2020-10-29 DIAGNOSIS — J309 Allergic rhinitis, unspecified: Secondary | ICD-10-CM | POA: Diagnosis not present

## 2020-10-29 DIAGNOSIS — K219 Gastro-esophageal reflux disease without esophagitis: Secondary | ICD-10-CM | POA: Diagnosis not present

## 2020-10-29 DIAGNOSIS — R42 Dizziness and giddiness: Secondary | ICD-10-CM | POA: Diagnosis not present

## 2020-10-29 DIAGNOSIS — S72001D Fracture of unspecified part of neck of right femur, subsequent encounter for closed fracture with routine healing: Secondary | ICD-10-CM | POA: Diagnosis not present

## 2020-10-29 DIAGNOSIS — G4700 Insomnia, unspecified: Secondary | ICD-10-CM | POA: Diagnosis not present

## 2020-10-29 DIAGNOSIS — E119 Type 2 diabetes mellitus without complications: Secondary | ICD-10-CM

## 2020-10-29 DIAGNOSIS — E871 Hypo-osmolality and hyponatremia: Secondary | ICD-10-CM | POA: Diagnosis not present

## 2020-10-29 DIAGNOSIS — Z043 Encounter for examination and observation following other accident: Secondary | ICD-10-CM | POA: Diagnosis not present

## 2020-10-29 DIAGNOSIS — M19012 Primary osteoarthritis, left shoulder: Secondary | ICD-10-CM | POA: Diagnosis not present

## 2020-10-29 DIAGNOSIS — M6281 Muscle weakness (generalized): Secondary | ICD-10-CM | POA: Diagnosis not present

## 2020-10-29 DIAGNOSIS — M25572 Pain in left ankle and joints of left foot: Secondary | ICD-10-CM | POA: Diagnosis not present

## 2020-10-29 DIAGNOSIS — I700 Atherosclerosis of aorta: Secondary | ICD-10-CM | POA: Diagnosis not present

## 2020-10-29 DIAGNOSIS — E785 Hyperlipidemia, unspecified: Secondary | ICD-10-CM | POA: Diagnosis present

## 2020-10-29 DIAGNOSIS — Y92010 Kitchen of single-family (private) house as the place of occurrence of the external cause: Secondary | ICD-10-CM | POA: Diagnosis not present

## 2020-10-29 DIAGNOSIS — R279 Unspecified lack of coordination: Secondary | ICD-10-CM | POA: Diagnosis not present

## 2020-10-29 DIAGNOSIS — Y93G1 Activity, food preparation and clean up: Secondary | ICD-10-CM

## 2020-10-29 DIAGNOSIS — R2681 Unsteadiness on feet: Secondary | ICD-10-CM | POA: Diagnosis not present

## 2020-10-29 DIAGNOSIS — Z885 Allergy status to narcotic agent status: Secondary | ICD-10-CM | POA: Diagnosis not present

## 2020-10-29 DIAGNOSIS — H04123 Dry eye syndrome of bilateral lacrimal glands: Secondary | ICD-10-CM | POA: Diagnosis not present

## 2020-10-29 DIAGNOSIS — R52 Pain, unspecified: Secondary | ICD-10-CM

## 2020-10-29 DIAGNOSIS — Z79899 Other long term (current) drug therapy: Secondary | ICD-10-CM

## 2020-10-29 LAB — CBC WITH DIFFERENTIAL/PLATELET
Abs Immature Granulocytes: 0.1 10*3/uL — ABNORMAL HIGH (ref 0.00–0.07)
Basophils Absolute: 0.1 10*3/uL (ref 0.0–0.1)
Basophils Relative: 0 %
Eosinophils Absolute: 0.1 10*3/uL (ref 0.0–0.5)
Eosinophils Relative: 1 %
HCT: 41.7 % (ref 36.0–46.0)
Hemoglobin: 12.9 g/dL (ref 12.0–15.0)
Immature Granulocytes: 1 %
Lymphocytes Relative: 7 %
Lymphs Abs: 1.1 10*3/uL (ref 0.7–4.0)
MCH: 31.1 pg (ref 26.0–34.0)
MCHC: 30.9 g/dL (ref 30.0–36.0)
MCV: 100.5 fL — ABNORMAL HIGH (ref 80.0–100.0)
Monocytes Absolute: 0.9 10*3/uL (ref 0.1–1.0)
Monocytes Relative: 6 %
Neutro Abs: 12.3 10*3/uL — ABNORMAL HIGH (ref 1.7–7.7)
Neutrophils Relative %: 85 %
Platelets: 296 10*3/uL (ref 150–400)
RBC: 4.15 MIL/uL (ref 3.87–5.11)
RDW: 14.1 % (ref 11.5–15.5)
WBC: 14.5 10*3/uL — ABNORMAL HIGH (ref 4.0–10.5)
nRBC: 0 % (ref 0.0–0.2)

## 2020-10-29 LAB — COMPREHENSIVE METABOLIC PANEL
ALT: 24 U/L (ref 0–44)
AST: 28 U/L (ref 15–41)
Albumin: 3.6 g/dL (ref 3.5–5.0)
Alkaline Phosphatase: 55 U/L (ref 38–126)
Anion gap: 9 (ref 5–15)
BUN: 30 mg/dL — ABNORMAL HIGH (ref 8–23)
CO2: 21 mmol/L — ABNORMAL LOW (ref 22–32)
Calcium: 9.4 mg/dL (ref 8.9–10.3)
Chloride: 106 mmol/L (ref 98–111)
Creatinine, Ser: 1.11 mg/dL — ABNORMAL HIGH (ref 0.44–1.00)
GFR, Estimated: 46 mL/min — ABNORMAL LOW (ref >60)
Glucose, Bld: 101 mg/dL — ABNORMAL HIGH (ref 70–99)
Potassium: 4.7 mmol/L (ref 3.5–5.1)
Sodium: 136 mmol/L (ref 135–145)
Total Bilirubin: 0.9 mg/dL (ref 0.3–1.2)
Total Protein: 6.7 g/dL (ref 6.5–8.1)

## 2020-10-29 LAB — PROTIME-INR
INR: 1 (ref 0.8–1.2)
Prothrombin Time: 12.8 seconds (ref 11.4–15.2)

## 2020-10-29 LAB — HEMOGLOBIN A1C
Hgb A1c MFr Bld: 6 % — ABNORMAL HIGH (ref 4.8–5.6)
Mean Plasma Glucose: 125.5 mg/dL

## 2020-10-29 LAB — RESP PANEL BY RT-PCR (FLU A&B, COVID) ARPGX2
Influenza A by PCR: NEGATIVE
Influenza B by PCR: NEGATIVE
SARS Coronavirus 2 by RT PCR: NEGATIVE

## 2020-10-29 LAB — GLUCOSE, CAPILLARY
Glucose-Capillary: 202 mg/dL — ABNORMAL HIGH (ref 70–99)
Glucose-Capillary: 122 mg/dL — ABNORMAL HIGH (ref 70–99)

## 2020-10-29 LAB — VITAMIN D 25 HYDROXY (VIT D DEFICIENCY, FRACTURES): Vit D, 25-Hydroxy: 28.27 ng/mL — ABNORMAL LOW (ref 30–100)

## 2020-10-29 MED ORDER — ENSURE ENLIVE PO LIQD
237.0000 mL | Freq: Two times a day (BID) | ORAL | Status: DC
  Filled 2020-10-29: qty 237

## 2020-10-29 MED ORDER — INSULIN ASPART 100 UNIT/ML IJ SOLN
0.0000 [IU] | Freq: Three times a day (TID) | INTRAMUSCULAR | Status: DC
  Administered 2020-10-30 – 2020-10-31 (×3): 1 [IU] via SUBCUTANEOUS
  Administered 2020-11-02 (×2): 2 [IU] via SUBCUTANEOUS
  Administered 2020-11-03: 1 [IU] via SUBCUTANEOUS

## 2020-10-29 MED ORDER — HYDROMORPHONE HCL 1 MG/ML IJ SOLN
0.5000 mg | INTRAMUSCULAR | Status: DC | PRN
  Administered 2020-10-29: 0.5 mg via INTRAVENOUS
  Filled 2020-10-29: qty 1

## 2020-10-29 MED ORDER — GLUCERNA SHAKE PO LIQD
237.0000 mL | Freq: Three times a day (TID) | ORAL | Status: DC
  Filled 2020-10-29: qty 237

## 2020-10-29 MED ORDER — SENNOSIDES-DOCUSATE SODIUM 8.6-50 MG PO TABS
1.0000 | ORAL_TABLET | Freq: Every evening | ORAL | Status: DC | PRN
  Administered 2020-10-30: 1 via ORAL
  Filled 2020-10-29: qty 1

## 2020-10-29 MED ORDER — ONDANSETRON HCL 4 MG/2ML IJ SOLN
4.0000 mg | Freq: Four times a day (QID) | INTRAMUSCULAR | Status: DC | PRN
  Administered 2020-10-29 (×2): 4 mg via INTRAVENOUS
  Filled 2020-10-29 (×2): qty 2

## 2020-10-29 MED ORDER — LACTATED RINGERS IV SOLN: INTRAVENOUS | Status: DC

## 2020-10-29 MED ORDER — GLUCERNA SHAKE PO LIQD
237.0000 mL | Freq: Two times a day (BID) | ORAL | Status: DC
  Administered 2020-10-30: 237 mL via ORAL
  Filled 2020-10-29: qty 237

## 2020-10-29 MED ORDER — ENOXAPARIN SODIUM 40 MG/0.4ML IJ SOSY
40.0000 mg | PREFILLED_SYRINGE | INTRAMUSCULAR | Status: DC
  Administered 2020-10-29: 40 mg via SUBCUTANEOUS
  Filled 2020-10-29: qty 0.4

## 2020-10-29 MED ORDER — HYDROCODONE-ACETAMINOPHEN 5-325 MG PO TABS
1.0000 | ORAL_TABLET | Freq: Four times a day (QID) | ORAL | Status: DC | PRN
  Administered 2020-10-29 – 2020-10-30 (×5): 1 via ORAL
  Administered 2020-10-31: 2 via ORAL
  Administered 2020-11-01: 1 via ORAL
  Filled 2020-10-29 (×4): qty 1
  Filled 2020-10-29: qty 2
  Filled 2020-10-29 (×2): qty 1

## 2020-10-29 MED ORDER — HYDROMORPHONE HCL 1 MG/ML IJ SOLN
0.5000 mg | Freq: Once | INTRAMUSCULAR | Status: AC
  Administered 2020-10-29: 0.5 mg via INTRAVENOUS
  Filled 2020-10-29: qty 1

## 2020-10-29 MED ORDER — MORPHINE SULFATE (PF) 2 MG/ML IV SOLN
0.5000 mg | INTRAVENOUS | Status: DC | PRN
Start: 2020-10-29 — End: 2020-11-01
  Administered 2020-11-01 (×2): 0.5 mg via INTRAVENOUS
  Filled 2020-10-29 (×3): qty 1

## 2020-10-29 NOTE — H&P (View-Only) (Signed)
 ORTHOPAEDIC CONSULTATION  REQUESTING PHYSICIAN: Wolfe, Allison, MD  PCP:  Bedsole, Amy E, MD  Chief Complaint: Right and Left Hip Pain   HPI: [name] is a 87 y.o. female who complains of right and left hip pain worse on the right after a fall when unloading the dishwasher this morning.  Patient reports that she slipped and fell landing on her right hip.  She had presented to the emergency room with a significant amount of pain of the right hip, reports also pain of the left hip.  Denies previous surgery to the right or left hip.  The initial fall occurred around 7:30 AM, patient's emergency call button's battery was dead and did not function.  Her caregiver arrived a few hours later and called for help.  Patient arrived to the ER via EMS.   Patient currently lives alone with assistance that comes few times a week.  She lives close to her daughter.  Patient has a past medical history of type 2 diabetes controlled by diet.  Reports a history of intermittent left eye discharge previously treated with ointment.  Patient also has a history of a right total knee replacement.  Past Medical History:  Diagnosis Date   Acute pharyngitis    Acute sinusitis, unspecified    Acute upper respiratory infections of unspecified site    Allergy    Anal and rectal polyp    Cataract    History of   Cavernous angioma    Degenerative cervical disc 02/2006   Diabetes mellitus    Type II   GERD (gastroesophageal reflux disease)    Gout    Hemangioma of unspecified site    Hyperlipidemia    Hypertension    Orthostatic hypotension 04/2006   Hospital   Osteoarthritis    Osteopenia    Other abscess of vulva    Palpitations    Syncope and collapse    1 episode   Past Surgical History:  Procedure Laterality Date   ABDOMINAL HYSTERECTOMY     Aortic dopplers     Mild plaque.  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   Activity limited secondary to knee discomfort/arthritis, but going to church exersice group two times a week.             Social Determinants of Health   Financial Resource Strain: Low Risk    Difficulty of Paying Living Expenses: Not hard   at all  Food Insecurity: No Food Insecurity   Worried About Charity fundraiser in the Last Year: Never true   Ran Out of Food in the Last Year: Never true  Transportation Needs: No Transportation Needs   Lack of Transportation (Medical): No   Lack of Transportation (Non-Medical): No  Physical Activity: Inactive   Days of Exercise per Week: 0 days   Minutes of Exercise per Session: 0 min  Stress: No Stress Concern Present   Feeling of Stress : Not at all  Social Connections: Not on file   Family History  Problem Relation Age of Onset   Heart disease Other        CHF   Cancer Maternal Aunt        Breast CA   Allergies  Allergen Reactions   Codeine Nausea And Vomiting   Other     Other reaction(s): nausea   Statins Other (See Comments)    Pt does not remember the reaction   Prior to Admission medications   Medication Sig Start Date End Date Taking? Authorizing Provider   acetaminophen (TYLENOL) 325 MG tablet Take 650 mg by mouth 2 (two) times daily.   Yes [provider]  aspirin EC 81 MG tablet Take 81 mg by mouth at bedtime.    Yes [provider]  Fiber POWD Take by mouth See admin instructions. Mix 1 rounded teaspoonful into 6-8 ounces of water and drink three times a day   Yes [provider]  fluticasone (FLONASE) 50 MCG/ACT nasal spray PLACE 2 SPRAYS INTO BOTH NOSTRILS DAILY AS NEEDED FOR ALLERGIES. 06/10/20  Yes Bedsole, Amy E, MD  loratadine (CLARITIN) 10 MG tablet Take 10 mg by mouth daily.    Yes [provider]  Multiple Vitamins-Minerals (ONE-A-DAY WOMENS 50+ ADVANTAGE) TABS Take 1 tablet by mouth daily with breakfast.   Yes [provider]  oxybutynin (DITROPAN) 5 MG tablet Take 1 tablet (5 mg total) by mouth at bedtime. 10/13/20  Yes Bedsole, Amy E, MD  polyethylene glycol (MIRALAX / GLYCOLAX) packet Take 17 g by mouth daily as needed for mild constipation (MIX AND DRINK).    Yes [provider]  Propylene Glycol (SYSTANE BALANCE) 0.6 % SOLN Place 1-2 drops into both eyes daily.   Yes [provider]  ramipril (ALTACE) 5 MG capsule TAKE 1 CAPSULE (5 MG TOTAL) BY MOUTH DAILY. 04/28/20  Yes Bedsole, Amy E, MD  Accu-Chek Softclix Lancets lancets CHECK BLOOD SUGAR 1 TIME A DAY. DX: E11.9 03/22/20   Bedsole, Amy E, MD  Blood Glucose Monitoring Suppl (ACCU-CHEK GUIDE) w/Device KIT 1 each by Does not apply route daily. Use to check blood sugar daily as needed.  Dx: E11.9 03/22/20   [name], MD  clotrimazole-betamethasone (LOTRISONE) cream Apply 1 application topically 2 (two) times daily. Patient not taking: Reported on 10/29/2020 09/17/20   [name], MD  glucose blood (ACCU-CHEK GUIDE) test strip Use to check blood sugar 1 time daily 03/22/20   Bedsole, Amy E, MD  Omega-3 Fatty Acids (FISH OIL) 1000 MG CAPS 1 capsule with a meal    [provider]  triamcinolone cream (KENALOG)  0.1 % Apply 1 application topically 2 (two) times daily. Patient not taking: Reported on 10/29/2020 02/02/20   Lesleigh Noe, MD   DG Chest 1 View  Result Date: 10/29/2020 CLINICAL DATA:  Fall. EXAM: CHEST  1 VIEW COMPARISON:  Radiographs 01/28/2020 and 09/12/2019. FINDINGS: 1155 hours. The  heart size and mediastinal contours are stable with mild aortic atherosclerosis. The lungs appear clear. There is no pleural effusion or pneumothorax. No acute fractures are evident. Advanced glenohumeral degenerative changes are present bilaterally. Telemetry leads overlie the chest. IMPRESSION: Stable chest. No evidence of acute injury or active cardiopulmonary process. Electronically Signed   By: William  Veazey M.D.   On: 10/29/2020 12:26   DG HIP UNILAT WITH PELVIS 2-3 VIEWS RIGHT  Result Date: 10/29/2020 CLINICAL DATA:  Hip pain EXAM: DG HIP (WITH OR WITHOUT PELVIS) 2-3V RIGHT COMPARISON:  Radiograph 07/08/2019 FINDINGS: There is a displaced subcapital/transcervical femoral neck fracture with varus angulation. Adjacent soft tissue swelling. Osteopenia. There is moderate bilateral hip osteoarthritis. Bilateral SI joint and lower lumbar spine degenerative change. IMPRESSION: Displaced right femoral neck fracture with varus angulation. Electronically Signed   By: Jacob  Kahn   On: 10/29/2020 12:27    Positive ROS: All other systems have been reviewed and were otherwise negative with the exception of those mentioned in the HPI and as above.  Physical Exam: General: Alert, no acute distress , discharge of the left eye Cardiovascular: No pedal edema Respiratory: No cyanosis, no use of accessory musculature GI: No organomegaly, abdomen is soft and non-tender Skin: No lesions in the area of chief complaint Neurologic: Sensation intact distally Psychiatric: Patient is competent for consent with normal mood and affect Lymphatic: No axillary or cervical lymphadenopathy  Right Lower Extremity:  Right lower  extremity is externally rotated and shortened. Skin is clean, dry, and intact.  Healed total knee replacement incision over the right knee  Pain with passive ankle motion at the hip.  No significant tenderness at the right hip, right knee, or right ankle.  SENSORY: sensation is intact to light touch in:  superficial peroneal nerve distribution (over dorsum of foot) deep peroneal nerve distribution (over first dorsal web space) sural nerve distribution (posterior to lateral malleolus) saphenous nerve distribution (over medial malleolus) medial plantar nerve distribution (medial sole of foot anteriorly) lateral plantar nerve distribution (lateral sole of foot anteriorly)  MOTOR:  +motor EHL (great toe dorsiflexion) + FHL (great toe plantar flexion)  + TA (ankle dorsiflexion)  + GSC (ankle plantar flexion)  VASCULAR: 2+ dorsalis pedis pulse, capillary refill < 2   Left lower extremity:  No tenderness to the left knee, ankle, or hip.  Able to passively move the hip and do a logroll with mild pain.  Neurovascularly intact.  Assessment: Displaced right femoral neck fracture with varus angulation, underlying arthritis  Left hip osteoarthritis  Plan: -Discussed with the patient and her daughter that due to the location of her fracture and her underlying right hip arthritis that her injury requires treatment with a total hip arthroplasty.  Unfortunately no total hip arthroplasty specialists are available this  weekend, the case has been discussed with Dr. Swinteck and we will plan for a right total hip arthroplasty with him on Monday.  -Discussed with the patient and her daughter the general risks and benefits of surgery including risk of blood clots, anesthesia, infection, bleeding, injury to nerves and vessels, failure of hardware and components.  Discussed that she would likely need to be discharged to a skilled nursing facility following surgery.  -Admit to  medicine  -Nonweightbearing to the right lower extremity.  -Start diet, Ensure supplements  - DVT Prophylaxis per primary   Estill Llerena D Demareon Coldwell, PA    10/29/2020 2:37 PM  

## 2020-10-29 NOTE — Progress Notes (Signed)
Pt received in room 5N31. Pt alert and oriented. Daughters x 2 accompanied pt to unit. Admission vitals done and pt made comfortable. Admission history obtained from daughters.

## 2020-10-29 NOTE — ED Notes (Addendum)
Call light is with patient and awaiting to see the doctor. Warm blankets given.

## 2020-10-29 NOTE — ED Notes (Signed)
Pt arrived soiled in urine and feces. NT and I cleaned pt up and applied purewick.

## 2020-10-29 NOTE — ED Notes (Signed)
The floor nurse is at lunch and not will not be able to take report at this time. They will call back after lunch.

## 2020-10-29 NOTE — H&P (Signed)
History and Physical    [name] AJG:[id number] DOB: 07-27-1922 DOA: 10/29/2020  PCP: [name], MD Consultants:  none  Patient coming from:  Home - lives alone. Margaret elliot   Chief Complaint: fall with right hip pain   HPI: [name] is a 87 y.o. female with medical history significant of diet controlled DM2, HTN, HLD, GERD, osteoporosis, nocturia and leukocytosis who presented to ER after fall onto right hip.  She states she was fixing breakfast this AM and was opening the dishwasher and lost her balance and fell onto her right hip.  She thinks this was around 8:00 or later. She tried to stand up and couldn't stand up. Her emergency help button had no battery so it didn't work. She pulled herself out of the kitchen on the floor. She can not bear weight. Her helper came at 9:15 this Am and heard her yelling to go to her neighbors house to get the key. They came over and called 911. Hip pain 10/10. She denies any headache, syncope, chest pain, palpitations, recent illness, fever/chills.    Recently treated for left eye discharge and using sustane daily. Denies any vision changes.    ED Course: vitals: afebrile, BP: 175/95, HR: 85, RR: 20, oxygen 100% RA, pain 10/10 in right hip. Right hip xray with displaced right femoral neck fracture with varus angulation. Ortho consulted in ER and plans for surgery Monday. Asked to admit.   Review of Systems: As per HPI; otherwise review of systems reviewed and negative.   Ambulatory Status:  Ambulates with walker    Past Medical History:  Diagnosis Date   Acute pharyngitis    Acute sinusitis, unspecified    Acute upper respiratory infections of unspecified site    Allergy    Anal and rectal polyp    Cataract    History of   Cavernous angioma    Degenerative cervical disc 02/2006   Diabetes mellitus    Type II   GERD (gastroesophageal reflux disease)    Gout    Hemangioma of unspecified site    Hyperlipidemia     Hypertension    Orthostatic hypotension 04/2006   Hospital   Osteoarthritis    Osteopenia    Other abscess of vulva    Palpitations    Syncope and collapse    1 episode    Past Surgical History:  Procedure Laterality Date   ABDOMINAL HYSTERECTOMY     Aortic dopplers     Mild plaque.  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   Activity limited secondary to knee discomfort/arthritis, but going to church exersice group two times a week.             Social Determinants of Health   Financial Resource Strain: Low Risk    Difficulty of Paying Living Expenses: Not hard at all  Food Insecurity: No Food Insecurity   Worried About Charity fundraiser in the Last Year: Never true   [place] in the Last Year: Never true  Transportation Needs: No Transportation Needs   Lack of Transportation (Medical): No   Lack of Transportation (Non-Medical): No  Physical Activity: Inactive   Days of Exercise per Week: 0 days   Minutes of Exercise per Session: 0 min  Stress: No Stress Concern Present   Feeling of Stress : Not at all  Social Connections: Not on file   Intimate Partner Violence: Not At Risk   Fear of Current or Ex-Partner: No   Emotionally Abused: No   Physically Abused: No   Sexually Abused: No    Allergies  Allergen Reactions   Codeine Nausea And Vomiting   Other     Other reaction(s): nausea   Statins Other (See Comments)    Pt does not remember the reaction    Family History  Problem Relation Age of Onset   Heart disease Other        CHF   Cancer Maternal Aunt        Breast CA    Prior to Admission medications   Medication Sig Start Date End Date Taking? Authorizing Provider  acetaminophen (TYLENOL) 325 MG tablet Take 650 mg by mouth 2 (two) times daily.   Yes [provider]  aspirin EC 81 MG tablet Take 81 mg by mouth at bedtime.    Yes [provider]  Fiber POWD Take 5 mLs by mouth See admin instructions. Mix 1 rounded teaspoonful into 6-8 ounces of water and drink three times a day   Yes [provider]  fluticasone (FLONASE) 50 MCG/ACT nasal spray PLACE 2 SPRAYS INTO BOTH NOSTRILS DAILY AS NEEDED FOR ALLERGIES. 06/10/20  Yes Bedsole, Amy E, MD  loratadine (CLARITIN) 10 MG tablet Take 10 mg by mouth daily.    Yes [provider]  Multiple Vitamins-Minerals (ONE-A-DAY WOMENS 50+ ADVANTAGE) TABS Take 1 tablet by mouth daily with breakfast.   Yes [provider]  oxybutynin (DITROPAN) 5 MG tablet Take 1 tablet (5 mg total) by mouth at bedtime. 10/13/20  Yes Bedsole, Amy E, MD  polyethylene glycol (MIRALAX / GLYCOLAX) packet Take 17 g by mouth daily as needed for mild constipation (MIX AND DRINK).    Yes [provider]  Propylene Glycol (SYSTANE BALANCE) 0.6 % SOLN Place 1-2 drops into both eyes daily.   Yes [provider]  ramipril (ALTACE) 5 MG capsule TAKE 1 CAPSULE (5 MG TOTAL) BY MOUTH DAILY. Patient taking differently: Take 5 mg by mouth daily. 04/28/20  Yes Bedsole, Amy E, MD  Accu-Chek Softclix Lancets lancets CHECK BLOOD SUGAR 1 TIME A DAY. DX: E11.9  03/22/20   Bedsole, Amy E, MD  Blood Glucose Monitoring Suppl (ACCU-CHEK GUIDE) w/Device KIT 1 each by Does not apply route daily. Use to check blood sugar daily as needed.  Dx: E11.9 03/22/20   [name], MD  clotrimazole-betamethasone (LOTRISONE) cream Apply 1 application  topically 2 (two) times daily. Patient not taking: No sig reported 09/17/20   Diona Browner, Amy E, MD  glucose blood (ACCU-CHEK GUIDE) test strip Use to check blood sugar 1 time daily 03/22/20   Bedsole, Amy E, MD  triamcinolone cream (KENALOG) 0.1 % Apply 1 application topically 2 (two) times daily. Patient not taking: No sig reported 02/02/20   Lesleigh Noe, MD    Physical Exam: Vitals:   10/29/20 1400 10/29/20 1415 10/29/20 1430 10/29/20 1445  BP: (!) 177/80 (!) 177/79 (!) 186/70 (!) 186/98  Pulse: 75 81 79 78  Resp: 16 (!) 25 15 16   Temp:      TempSrc:      SpO2: 98% 100% 97% 98%  Weight:      Height:         General:  Appears calm and comfortable and is in NAD Eyes:  PERRL, EOMI, normal lids, iris. She has white discharge from left eye. Sclera clear.  ENT:  hard of hearing, lips & tongue, mmm; appropriate dentition Neck:  no LAD, masses or thyromegaly; no carotid bruits Cardiovascular:  RRR, no m/r/g. No LE edema.  Respiratory:   CTA bilaterally with no wheezes/rales/rhonchi.  Normal respiratory effort. Abdomen:  soft, NT, ND, NABS Back:   can not examine  Skin:  no rash or induration seen on limited exam Musculoskeletal:  right leg shortened and externally rotated. Sensation intact to foot and can move toes. Pedal pulses intact. LLE wnl.  Lower extremity:  No LE edema.  Limited foot exam with no ulcerations.  2+ distal pulses. Psychiatric:  grossly normal mood and affect, speech fluent and appropriate, AOx3 Neurologic:  CN 2-12 grossly intact, moves all extremities in coordinated fashion, sensation intact    Radiological Exams on Admission: Independently reviewed - see discussion in A/P where  applicable  DG Chest 1 View  Result Date: 10/29/2020 CLINICAL DATA:  Fall. EXAM: CHEST  1 VIEW COMPARISON:  Radiographs 01/28/2020 and 09/12/2019. FINDINGS: 1155 hours. The heart size and mediastinal contours are stable with mild aortic atherosclerosis. The lungs appear clear. There is no pleural effusion or pneumothorax. No acute fractures are evident. Advanced glenohumeral degenerative changes are present bilaterally. Telemetry leads overlie the chest. IMPRESSION: Stable chest. No evidence of acute injury or active cardiopulmonary process. Electronically Signed   By: Richardean Sale M.D.   On: 10/29/2020 12:26   DG HIP UNILAT WITH PELVIS 2-3 VIEWS RIGHT  Result Date: 10/29/2020 CLINICAL DATA:  Hip pain EXAM: DG HIP (WITH OR WITHOUT PELVIS) 2-3V RIGHT COMPARISON:  Radiograph 07/08/2019 FINDINGS: There is a displaced subcapital/transcervical femoral neck fracture with varus angulation. Adjacent soft tissue swelling. Osteopenia. There is moderate bilateral hip osteoarthritis. Bilateral SI joint and lower lumbar spine degenerative change. IMPRESSION: Displaced right femoral neck fracture with varus angulation. Electronically Signed   By: Maurine Simmering   On: 10/29/2020 12:27    EKG: Independently reviewed.  NSR with rate 83; nonspecific ST changes with no evidence of acute ischemia. Similar to previous tracing.    Labs on Admission: I have personally reviewed the available labs and imaging studies at the time of the admission.  Pertinent labs:  Displaced right femoral neck fracture with varus angulation  Wbc: 14.5 Mcv: 100.5 Creatinine: 1.11/BUN 30  (creatinine: .85-1.20)  INR: 1.0  Assessment/Plan Closed displaced fracture of right femoral neck (HCC) -ortho consulted and plan for surgery with a right total hip arthroplasty on Monday with Dr. Lyla Glassing.  -likely discharged to SNF for rehab.  -  will do pain control with tylenol, norco q6 hours prn and then IV morphine .59m q2 hours for severe pain.  Continuous pulse ox while on pain meds.  -non weight bearing to RLE -lovenox prophylaxis since surgery Monday   Elevated creatinine -slight bump, not AKI, will give her light IVF x 12 hours -encourage oral intake -recheck in AM    Controlled type 2 diabetes mellitus without complication, without long-term current use of insulin ([facility] [place] -diet controlled -glucerna TID -SSI with accuchecks -a1c: 5.6 in 11/21.     Essential hypertension, benign -elevated here likely secondary to pain -home readings and readings in office have been to goal per PCP notes -continue home ACE-I.     Leukocytosis Has been monitored outpatient by PCP Will continue to monitor while here unless clinically indicated to work up     Discharge of eye, left -has been treated outpatient. No sign of bacterial infection. Continue to monitor.  -continue sustane and wipe away discharge.     Hyperlipidemia LDL goal <100 -no medication, allergy to statin     Osteoporosis -checking vitamin D -discussed calcium and vitamin D as she is only on MV -in light of hip fracture would discuss treatment outpatient and put her on correct calcium/D for fracture prevention     Nocturia  -recently started ditropan outpatient, but doesn't like how it makes her feel. Was told by PCP to try 2-3x/week; however, she would like to not have it while in hospital.  -tried to get myrbetriq but too costly.    Body mass index is 22.49 kg/m.  Note: This patient has been tested and is negative for the novel coronavirus COVID-19.   Level of care: Med-Surg DVT prophylaxis:  Lovenox Code Status:  DNR - confirmed with patient Family Communication: daughter present: margaret elliot  Disposition Plan:  The patient is from: home  Anticipated d/c is to: SNF with rehab    Patient is currently: inpatient with need for surgical intervention for her fractured hip.  Consults called: ortho by EDP.   Admission status:  inpatient    AOrma Flaming MD [facility]   How to contact the [facility] Of [facility] JerseyAttending or Consulting provider 7[place]or covering provider during after hours [place] for this patient?  Check the care team in [facility] look for a) attending/consulting [facility] provider listed and b) the [facility]team listed Log into [email or web address] and use [place]'s universal password to access. If you do not have the password, please contact the hospital operator. Locate the TA[facility] Campusprovider you are looking for under [facility] and page to a number that you can be directly reached. If you still have difficulty reaching the provider, please page the [facility](Director on Call) for the Hospitalists listed on amion for assistance.   10/29/2020, 3:07 PM

## 2020-10-29 NOTE — ED Triage Notes (Signed)
Patient had a mechanical fall at home today around 7am, was not able to get up until home health aid came by 9am. C/o right hip pain with shorting noted. EMS gave 50 mcg of fentanyl.

## 2020-10-29 NOTE — ED Notes (Signed)
Nurse report given to Vanita Ingles, RN

## 2020-10-29 NOTE — ED Notes (Signed)
ED Provider at bedside. 

## 2020-10-29 NOTE — ED Notes (Addendum)
Pt had medium sized bm that soiled pure wick no urine able to be obtained, pure wick replaced

## 2020-10-29 NOTE — Consult Note (Signed)
ORTHOPAEDIC CONSULTATION  REQUESTING PHYSICIAN: Orma Flaming, MD  PCP:  [name], MD  Chief Complaint: Right and Left Hip Pain   HPI: [name] is a 87 y.o. female who complains of right and left hip pain worse on the right after a fall when unloading the dishwasher this morning.  Patient reports that she slipped and fell landing on her right hip.  She had presented to the emergency room with a significant amount of pain of the right hip, reports also pain of the left hip.  Denies previous surgery to the right or left hip.  The initial fall occurred around 7:30 AM, patient's emergency call button's battery was dead and did not function.  Her caregiver arrived a few hours later and called for help.  Patient arrived to the ER via EMS.   Patient currently lives alone with assistance that comes few times a week.  She lives close to her daughter.  Patient has a past medical history of type 2 diabetes controlled by diet.  Reports a history of intermittent left eye discharge previously treated with ointment.  Patient also has a history of a right total knee replacement.  Past Medical History:  Diagnosis Date   Acute pharyngitis    Acute sinusitis, unspecified    Acute upper respiratory infections of unspecified site    Allergy    Anal and rectal polyp    Cataract    History of   Cavernous angioma    Degenerative cervical disc 02/2006   Diabetes mellitus    Type II   GERD (gastroesophageal reflux disease)    Gout    Hemangioma of unspecified site    Hyperlipidemia    Hypertension    Orthostatic hypotension 04/2006   Hospital   Osteoarthritis    Osteopenia    Other abscess of vulva    Palpitations    Syncope and collapse    1 episode   Past Surgical History:  Procedure Laterality Date   ABDOMINAL HYSTERECTOMY     Aortic dopplers     Mild plaque.  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   Activity limited secondary to knee discomfort/arthritis, but going to church exersice group two times a week.             Social Determinants of Health   Financial Resource Strain: Low Risk    Difficulty of Paying Living Expenses: Not hard  at all  Food Insecurity: No Food Insecurity   Worried About Charity fundraiser in the Last Year: Never true   Ran Out of Food in the Last Year: Never true  Transportation Needs: No Transportation Needs   Lack of Transportation (Medical): No   Lack of Transportation (Non-Medical): No  Physical Activity: Inactive   Days of Exercise per Week: 0 days   Minutes of Exercise per Session: 0 min  Stress: No Stress Concern Present   Feeling of Stress : Not at all  Social Connections: Not on file   Family History  Problem Relation Age of Onset   Heart disease Other        CHF   Cancer Maternal Aunt        Breast CA   Allergies  Allergen Reactions   Codeine Nausea And Vomiting   Other     Other reaction(s): nausea   Statins Other (See Comments)    Pt does not remember the reaction   Prior to Admission medications   Medication Sig Start Date End Date Taking? Authorizing Provider   acetaminophen (TYLENOL) 325 MG tablet Take 650 mg by mouth 2 (two) times daily.   Yes [provider]  aspirin EC 81 MG tablet Take 81 mg by mouth at bedtime.    Yes [provider]  Fiber POWD Take by mouth See admin instructions. Mix 1 rounded teaspoonful into 6-8 ounces of water and drink three times a day   Yes [provider]  fluticasone (FLONASE) 50 MCG/ACT nasal spray PLACE 2 SPRAYS INTO BOTH NOSTRILS DAILY AS NEEDED FOR ALLERGIES. 06/10/20  Yes Bedsole, Amy E, MD  loratadine (CLARITIN) 10 MG tablet Take 10 mg by mouth daily.    Yes [provider]  Multiple Vitamins-Minerals (ONE-A-DAY WOMENS 50+ ADVANTAGE) TABS Take 1 tablet by mouth daily with breakfast.   Yes [provider]  oxybutynin (DITROPAN) 5 MG tablet Take 1 tablet (5 mg total) by mouth at bedtime. 10/13/20  Yes Bedsole, Amy E, MD  polyethylene glycol (MIRALAX / GLYCOLAX) packet Take 17 g by mouth daily as needed for mild constipation (MIX AND DRINK).    Yes [provider]  Propylene Glycol (SYSTANE BALANCE) 0.6 % SOLN Place 1-2 drops into both eyes daily.   Yes [provider]  ramipril (ALTACE) 5 MG capsule TAKE 1 CAPSULE (5 MG TOTAL) BY MOUTH DAILY. 04/28/20  Yes Bedsole, Amy E, MD  Accu-Chek Softclix Lancets lancets CHECK BLOOD SUGAR 1 TIME A DAY. DX: E11.9 03/22/20   Bedsole, Amy E, MD  Blood Glucose Monitoring Suppl (ACCU-CHEK GUIDE) w/Device KIT 1 each by Does not apply route daily. Use to check blood sugar daily as needed.  Dx: E11.9 03/22/20   [name], MD  clotrimazole-betamethasone (LOTRISONE) cream Apply 1 application topically 2 (two) times daily. Patient not taking: Reported on 10/29/2020 09/17/20   [name], MD  glucose blood (ACCU-CHEK GUIDE) test strip Use to check blood sugar 1 time daily 03/22/20   Bedsole, Amy E, MD  Omega-3 Fatty Acids (FISH OIL) 1000 MG CAPS 1 capsule with a meal    [provider]  triamcinolone cream (KENALOG)  0.1 % Apply 1 application topically 2 (two) times daily. Patient not taking: Reported on 10/29/2020 02/02/20   Lesleigh Noe, MD   DG Chest 1 View  Result Date: 10/29/2020 CLINICAL DATA:  Fall. EXAM: CHEST  1 VIEW COMPARISON:  Radiographs 01/28/2020 and 09/12/2019. FINDINGS: 1155 hours. The  heart size and mediastinal contours are stable with mild aortic atherosclerosis. The lungs appear clear. There is no pleural effusion or pneumothorax. No acute fractures are evident. Advanced glenohumeral degenerative changes are present bilaterally. Telemetry leads overlie the chest. IMPRESSION: Stable chest. No evidence of acute injury or active cardiopulmonary process. Electronically Signed   By: Richardean Sale M.D.   On: 10/29/2020 12:26   DG HIP UNILAT WITH PELVIS 2-3 VIEWS RIGHT  Result Date: 10/29/2020 CLINICAL DATA:  Hip pain EXAM: DG HIP (WITH OR WITHOUT PELVIS) 2-3V RIGHT COMPARISON:  Radiograph 07/08/2019 FINDINGS: There is a displaced subcapital/transcervical femoral neck fracture with varus angulation. Adjacent soft tissue swelling. Osteopenia. There is moderate bilateral hip osteoarthritis. Bilateral SI joint and lower lumbar spine degenerative change. IMPRESSION: Displaced right femoral neck fracture with varus angulation. Electronically Signed   By: Maurine Simmering   On: 10/29/2020 12:27    Positive ROS: All other systems have been reviewed and were otherwise negative with the exception of those mentioned in the HPI and as above.  Physical Exam: General: Alert, no acute distress , discharge of the left eye Cardiovascular: No pedal edema Respiratory: No cyanosis, no use of accessory musculature GI: No organomegaly, abdomen is soft and non-tender Skin: No lesions in the area of chief complaint Neurologic: Sensation intact distally Psychiatric: Patient is competent for consent with normal mood and affect Lymphatic: No axillary or cervical lymphadenopathy  Right Lower Extremity:  Right lower  extremity is externally rotated and shortened. Skin is clean, dry, and intact.  Healed total knee replacement incision over the right knee  Pain with passive ankle motion at the hip.  No significant tenderness at the right hip, right knee, or right ankle.  SENSORY: sensation is intact to light touch in:  superficial peroneal nerve distribution (over dorsum of foot) deep peroneal nerve distribution (over first dorsal web space) sural nerve distribution (posterior to lateral malleolus) saphenous nerve distribution (over medial malleolus) medial plantar nerve distribution (medial sole of foot anteriorly) lateral plantar nerve distribution (lateral sole of foot anteriorly)  MOTOR:  +motor EHL (great toe dorsiflexion) + FHL (great toe plantar flexion)  + TA (ankle dorsiflexion)  + GSC (ankle plantar flexion)  VASCULAR: 2+ dorsalis pedis pulse, capillary refill < 2   Left lower extremity:  No tenderness to the left knee, ankle, or hip.  Able to passively move the hip and do a logroll with mild pain.  Neurovascularly intact.  Assessment: Displaced right femoral neck fracture with varus angulation, underlying arthritis  Left hip osteoarthritis  Plan: -Discussed with the patient and her daughter that due to the location of her fracture and her underlying right hip arthritis that her injury requires treatment with a total hip arthroplasty.  Unfortunately no total hip arthroplasty specialists are available this  weekend, the case has been discussed with Dr. Lyla Glassing and we will plan for a right total hip arthroplasty with him on Monday.  -Discussed with the patient and her daughter the general risks and benefits of surgery including risk of blood clots, anesthesia, infection, bleeding, injury to nerves and vessels, failure of hardware and components.  Discussed that she would likely need to be discharged to a skilled nursing facility following surgery.  -Admit to  medicine  -Nonweightbearing to the right lower extremity.  -Start diet, Ensure supplements  - DVT Prophylaxis per primary   Faythe Casa, PA    10/29/2020 2:37 PM

## 2020-10-29 NOTE — ED Notes (Signed)
The OR staff PA met with patient and family. They will not be doing the surgery today. The orthopedic doctor that does hips is not working today. The PA gave the okay to eat at this time.

## 2020-10-29 NOTE — ED Notes (Signed)
Family updated as to patient's status.

## 2020-10-29 NOTE — ED Provider Notes (Signed)
[facility] EMERGENCY DEPARTMENT Provider Note   CSN: [id number] Arrival date & time: 10/29/20  1023     History Chief Complaint  Patient presents with   Fall   Hip Pain    Right     [name] is a 87 y.o. female.  HPI Patient reports that she was unloading her dishwasher this morning.  She slipped in the process and fell to the floor.  She reports that she had a lot of pain in her right hip.  She was unable to get back up.  This happened at about 7:30 AM.  She reports her emergency call button did not work.  She had to wait for her caregiver to arrive several hours later for assistance.  Patient denies that he struck her head.  She denies headache or neck pain.  She denies shortness of breath chest pain.  She denies abdominal pain.  She reports she has a lot of pain in the right hip.  She also ports her some pain to the left hip as well.  She reports she has diabetes and this weekend was not feeling well.  She reports she was a little tremulous but did not have fever chills vomiting or diarrhea.  She reports she was started on a medication for urinary frequency.    Past Medical History:  Diagnosis Date   Acute pharyngitis    Acute sinusitis, unspecified    Acute upper respiratory infections of unspecified site    Allergy    Anal and rectal polyp    Cataract    History of   Cavernous angioma    Degenerative cervical disc 02/2006   Diabetes mellitus    Type II   GERD (gastroesophageal reflux disease)    Gout    Hemangioma of unspecified site    Hyperlipidemia    Hypertension    Orthostatic hypotension 04/2006   Hospital   Osteoarthritis    Osteopenia    Other abscess of vulva    Palpitations    Syncope and collapse    1 episode    Patient Active Problem List   Diagnosis Date Noted   Closed displaced fracture of right femoral neck ([place]) 10/29/2020   Sprain of anterior talofibular ligament of left ankle 06/22/2020   Acute cystitis without  hematuria 05/10/2020   History of colonic polyps 04/28/2020   Urinary frequency 12/16/2019   Cough, persistent 11/06/2019   Acute neck pain 09/26/2019   Seborrheic dermatitis of scalp 09/26/2019   Nocturia 08/22/2019   Leukocytosis 06/13/2018   Seborrheic keratoses 10/16/2017   Hyponatremia 09/14/2017   Chronic left upper quadrant pain 08/07/2017   Right hip pain 07/12/2017   Hearing loss 02/20/2017   TIA (transient ischemic attack), possible 11/14/2016   Left carpal tunnel syndrome 08/11/2016   Fatigue 06/15/2016   Constipation, chronic 03/21/2016   Near syncope 05/07/2015   Osteoarthritis of right shoulder 05/04/2015   Counseling regarding end of life decision making 01/15/2015   Skin lesion of right leg 07/10/2014   Insomnia 04/10/2014   Shakiness 12/16/2013   Osteoarthritis of right knee 10/14/2013   Osteoarthritis of left knee 10/14/2013   Infected sebaceous cyst 11/29/2012   Gout, chronic, without tophus 12/13/2009   Osteoporosis 10/29/2009   PALPITATIONS, OCCASIONAL 07/07/2008   Allergic rhinitis 06/02/2008   HEMANGIOMA 07/30/2006   Controlled type 2 diabetes mellitus without complication, without long-term current use of insulin ([place]) 07/30/2006   Hyperlipidemia LDL goal <100 07/30/2006   Essential  hypertension, benign 07/30/2006   GERD 07/30/2006   RECTAL POLYPS 07/30/2006   Osteoarthritis 07/30/2006   CATARACT, HX OF 07/30/2006    Past Surgical History:  Procedure Laterality Date   ABDOMINAL HYSTERECTOMY     Aortic dopplers     Mild plaque.  EEG okay   CATARACT EXTRACTION     Cavernous angioma     Cavernous hemangioma  04/2004   Hospital   TOTAL KNEE ARTHROPLASTY     Right     OB History   No obstetric history on file.     Family History  Problem Relation Age of Onset   Heart disease Other        CHF   Cancer Maternal Aunt        Breast CA    Social History   Tobacco Use   Smoking status: Former    Pack years: 0.00   Smokeless tobacco:  Never   Tobacco comments:    quit 1970  Vaping Use   Vaping Use: Never used  Substance Use Topics   Alcohol use: Yes    Alcohol/week: 0.0 standard drinks    Comment: wine rarely   Drug use: No    Home Medications Prior to Admission medications   Medication Sig Start Date End Date Taking? Authorizing Provider  acetaminophen (TYLENOL) 325 MG tablet Take 650 mg by mouth 2 (two) times daily.   Yes [provider]  aspirin EC 81 MG tablet Take 81 mg by mouth at bedtime.    Yes [provider]  Fiber POWD Take 5 mLs by mouth See admin instructions. Mix 1 rounded teaspoonful into 6-8 ounces of water and drink three times a day   Yes [provider]  fluticasone (FLONASE) 50 MCG/ACT nasal spray PLACE 2 SPRAYS INTO BOTH NOSTRILS DAILY AS NEEDED FOR ALLERGIES. 06/10/20  Yes Bedsole, Amy E, MD  loratadine (CLARITIN) 10 MG tablet Take 10 mg by mouth daily.    Yes [provider]  Multiple Vitamins-Minerals (ONE-A-DAY WOMENS 50+ ADVANTAGE) TABS Take 1 tablet by mouth daily with breakfast.   Yes [provider]  oxybutynin (DITROPAN) 5 MG tablet Take 1 tablet (5 mg total) by mouth at bedtime. 10/13/20  Yes Bedsole, Amy E, MD  polyethylene glycol (MIRALAX / GLYCOLAX) packet Take 17 g by mouth daily as needed for mild constipation (MIX AND DRINK).    Yes [provider]  Propylene Glycol (SYSTANE BALANCE) 0.6 % SOLN Place 1-2 drops into both eyes daily.   Yes [provider]  ramipril (ALTACE) 5 MG capsule TAKE 1 CAPSULE (5 MG TOTAL) BY MOUTH DAILY. Patient taking differently: Take 5 mg by mouth daily. 04/28/20  Yes Bedsole, Amy E, MD  Accu-Chek Softclix Lancets lancets CHECK BLOOD SUGAR 1 TIME A DAY. DX: E11.9 03/22/20   Bedsole, Amy E, MD  Blood Glucose Monitoring Suppl (ACCU-CHEK GUIDE) w/Device KIT 1 each by Does not apply route daily. Use to check blood sugar daily as needed.  Dx: E11.9 03/22/20   [name], MD   clotrimazole-betamethasone (LOTRISONE) cream Apply 1 application topically 2 (two) times daily. Patient not taking: No sig reported 09/17/20   Diona Browner, Amy E, MD  glucose blood (ACCU-CHEK GUIDE) test strip Use to check blood sugar 1 time daily 03/22/20   Bedsole, Amy E, MD  triamcinolone cream (KENALOG) 0.1 % Apply 1 application topically 2 (two) times daily. Patient not taking: No sig reported 02/02/20   Lesleigh Noe, MD  Allergies    Codeine, Other, and Statins  Review of Systems   Review of Systems 10 systems reviewed and negative except as per HPI Physical Exam Updated Vital Signs BP (!) 148/97   Pulse 84   Temp 97.9 F (36.6 C) (Oral)   Resp 20   Ht _0  (1.6 m)   Wt 57.6 kg   SpO2 98%   BMI 22.49 kg/m   Physical Exam Constitutional:      Comments: Alert.  Clear mental status.  GCS 15.  No respiratory distress  HENT:     Head: Normocephalic and atraumatic.     Nose: Nose normal.     Mouth/Throat:     Pharynx: Oropharynx is clear.  Eyes:     Comments: Patient has some thick drainage from the left eye.  This has been chronic and has been treated with several different types of drops.  Extraocular motions intact  Neck:     Comments: No midline C-spine tenderness Cardiovascular:     Rate and Rhythm: Normal rate and regular rhythm.  Pulmonary:     Effort: Pulmonary effort is normal.     Breath sounds: Normal breath sounds.  Chest:     Chest wall: No tenderness.  Abdominal:     General: There is no distension.     Palpations: Abdomen is soft.     Tenderness: There is no abdominal tenderness.  Musculoskeletal:     Comments: Patient has external rotation and shortening of the right lower extremity.  Dorsalis pedis pulses are 2+ and symmetric.  Patient can move the toes of the right foot  Skin:    General: Skin is warm and dry.  Neurological:     General: No focal deficit present.     Mental Status: She is oriented to person, place, and time.      Coordination: Coordination normal.  Psychiatric:        Mood and Affect: Mood normal.    ED Results / Procedures / Treatments   Labs (all labs ordered are listed, but only abnormal results are displayed) Labs Reviewed  COMPREHENSIVE METABOLIC PANEL - Abnormal; Notable for the following components:      Result Value   CO2 21 (*)    Glucose, Bld 101 (*)    BUN 30 (*)    Creatinine, Ser 1.11 (*)    GFR, Estimated 46 (*)    All other components within normal limits  CBC WITH DIFFERENTIAL/PLATELET - Abnormal; Notable for the following components:   WBC 14.5 (*)    MCV 100.5 (*)    Neutro Abs 12.3 (*)    Abs Immature Granulocytes 0.10 (*)    All other components within normal limits  RESP PANEL BY RT-PCR (FLU A&B, COVID) [id number]  PROTIME-INR  URINALYSIS, ROUTINE W REFLEX MICROSCOPIC    EKG EKG Interpretation  Date/Time:  Friday October 29 2020 10:42:56 EDT Ventricular Rate:  83 PR Interval:  164 QRS Duration: 84 QT Interval:  391 QTC Calculation: 460 R Axis:   -57 Text Interpretation: Sinus or ectopic atrial rhythm LVH with secondary repolarization abnormality Inferior infarct, old Anterior infarct, old no acute ischemic changes since previous Confirmed by Charlesetta Shanks [phone]) on 10/29/2020 2:04:49 PM  Radiology DG Chest 1 View  Result Date: 10/29/2020 CLINICAL DATA:  Fall. EXAM: CHEST  1 VIEW COMPARISON:  Radiographs 01/28/2020 and 09/12/2019. FINDINGS: 1155 hours. The heart size and mediastinal contours are stable with mild aortic atherosclerosis. The lungs appear clear. There is  no pleural effusion or pneumothorax. No acute fractures are evident. Advanced glenohumeral degenerative changes are present bilaterally. Telemetry leads overlie the chest. IMPRESSION: Stable chest. No evidence of acute injury or active cardiopulmonary process. Electronically Signed   By: Richardean Sale M.D.   On: 10/29/2020 12:26   DG HIP UNILAT WITH PELVIS 2-3 VIEWS RIGHT  Result Date:  10/29/2020 CLINICAL DATA:  Hip pain EXAM: DG HIP (WITH OR WITHOUT PELVIS) 2-3V RIGHT COMPARISON:  Radiograph 07/08/2019 FINDINGS: There is a displaced subcapital/transcervical femoral neck fracture with varus angulation. Adjacent soft tissue swelling. Osteopenia. There is moderate bilateral hip osteoarthritis. Bilateral SI joint and lower lumbar spine degenerative change. IMPRESSION: Displaced right femoral neck fracture with varus angulation. Electronically Signed   By: Maurine Simmering   On: 10/29/2020 12:27    Procedures Procedures   Medications Ordered in ED Medications  lactated ringers infusion ( Intravenous New Bag/Given 10/29/20 1128)  ondansetron (ZOFRAN) injection 4 mg (4 mg Intravenous Given 10/29/20 1127)  HYDROmorphone (DILAUDID) injection 0.5 mg (has no administration in time range)  HYDROmorphone (DILAUDID) injection 0.5 mg (0.5 mg Intravenous Given 10/29/20 1128)    ED Course  I have reviewed the triage vital signs and the nursing notes.  Pertinent labs & imaging results that were available during my care of the patient were reviewed by me and considered in my medical decision making (see chart for details).  Clinical Course as of 10/29/20 1405  Fri Oct 29, 2020  1323 Ortho plan: Dr. Lyla Glassing to repair on Monday.  Patient being monitored by Dr. Stann Mainland over the weekend. [MP]  1402 Consult: [facility] hospitalist Dr. Alvino Chapel to admit [MP]    Clinical Course User Index [MP] Charlesetta Shanks, MD   MDM Rules/Calculators/A&P                         Patient had a mechanical fall.  At this point, findings are consistent with isolated hip fracture.  Patient does not complain of other areas of pain.  Physical exam does not suggest other fracture or significant injury.  Patient does report feeling somewhat shaky and generally weak over the weekend.  However, as she describes episode leading to fall at this does not suggest syncope or near syncope.  Patient has stable blood pressures and heart rate.   No signs of sirs symptoms.  Patient does describe a new medication for urinary frequency.  Urinalysis is pending at this time.  Ental status is clear.  She follows commands that difficulty.  No signs of stroke.  Plan will be for admission to hospitalist service and orthopedic management for hip fracture.  Final Clinical Impression(s) / ED Diagnoses Final diagnoses:  Closed fracture of right hip, initial encounter [facility])    Rx / [place] Orders ED Discharge Orders     None        Charlesetta Shanks, MD 10/29/20 1409

## 2020-10-30 DIAGNOSIS — R7989 Other specified abnormal findings of blood chemistry: Secondary | ICD-10-CM

## 2020-10-30 DIAGNOSIS — H5789 Other specified disorders of eye and adnexa: Secondary | ICD-10-CM

## 2020-10-30 DIAGNOSIS — E119 Type 2 diabetes mellitus without complications: Secondary | ICD-10-CM

## 2020-10-30 LAB — BASIC METABOLIC PANEL
Anion gap: 4 — ABNORMAL LOW (ref 5–15)
BUN: 21 mg/dL (ref 8–23)
CO2: 27 mmol/L (ref 22–32)
Calcium: 9.1 mg/dL (ref 8.9–10.3)
Chloride: 103 mmol/L (ref 98–111)
Creatinine, Ser: 1.06 mg/dL — ABNORMAL HIGH (ref 0.44–1.00)
GFR, Estimated: 48 mL/min — ABNORMAL LOW (ref >60)
Glucose, Bld: 117 mg/dL — ABNORMAL HIGH (ref 70–99)
Potassium: 4.2 mmol/L (ref 3.5–5.1)
Sodium: 134 mmol/L — ABNORMAL LOW (ref 135–145)

## 2020-10-30 LAB — CBC
HCT: 34.7 % — ABNORMAL LOW (ref 36.0–46.0)
Hemoglobin: 11.7 g/dL — ABNORMAL LOW (ref 12.0–15.0)
MCH: 31.9 pg (ref 26.0–34.0)
MCHC: 33.7 g/dL (ref 30.0–36.0)
MCV: 94.6 fL (ref 80.0–100.0)
Platelets: 240 10*3/uL (ref 150–400)
RBC: 3.67 MIL/uL — ABNORMAL LOW (ref 3.87–5.11)
RDW: 13.7 % (ref 11.5–15.5)
WBC: 13.2 10*3/uL — ABNORMAL HIGH (ref 4.0–10.5)
nRBC: 0 % (ref 0.0–0.2)

## 2020-10-30 LAB — GLUCOSE, CAPILLARY
Glucose-Capillary: 100 mg/dL — ABNORMAL HIGH (ref 70–99)
Glucose-Capillary: 158 mg/dL — ABNORMAL HIGH (ref 70–99)
Glucose-Capillary: 142 mg/dL — ABNORMAL HIGH (ref 70–99)
Glucose-Capillary: 140 mg/dL — ABNORMAL HIGH (ref 70–99)

## 2020-10-30 MED ORDER — ADULT MULTIVITAMIN W/MINERALS CH
1.0000 | ORAL_TABLET | Freq: Every day | ORAL | Status: DC
  Administered 2020-10-30 – 2020-10-31 (×2): 1 via ORAL
  Filled 2020-10-30 (×2): qty 1

## 2020-10-30 MED ORDER — ENOXAPARIN SODIUM 30 MG/0.3ML IJ SOSY
30.0000 mg | PREFILLED_SYRINGE | INTRAMUSCULAR | Status: DC
  Administered 2020-10-30 – 2020-10-31 (×2): 30 mg via SUBCUTANEOUS
  Filled 2020-10-30 (×2): qty 0.3

## 2020-10-30 MED ORDER — ONE-A-DAY WOMENS 50+ ADVANTAGE PO TABS: 1.0000 | ORAL_TABLET | Freq: Every day | ORAL | Status: DC

## 2020-10-30 MED ORDER — POLYVINYL ALCOHOL 1.4 % OP SOLN
1.0000 [drp] | Freq: Every day | OPHTHALMIC | Status: DC
  Administered 2020-10-30 – 2020-11-03 (×5): 1 [drp] via OPHTHALMIC
  Filled 2020-10-30 (×2): qty 15

## 2020-10-30 MED ORDER — LORATADINE 10 MG PO TABS
5.0000 mg | ORAL_TABLET | Freq: Every day | ORAL | Status: DC
  Administered 2020-10-30 – 2020-10-31 (×2): 5 mg via ORAL
  Filled 2020-10-30 (×2): qty 1

## 2020-10-30 MED ORDER — FIBER PO POWD: 5.0000 mL | ORAL | Status: DC

## 2020-10-30 MED ORDER — VITAMIN D (ERGOCALCIFEROL) 1.25 MG (50000 UNIT) PO CAPS
50000.0000 [IU] | ORAL_CAPSULE | ORAL | Status: DC
  Administered 2020-10-30: 50000 [IU] via ORAL
  Filled 2020-10-30: qty 1

## 2020-10-30 MED ORDER — PROPYLENE GLYCOL 0.6 % OP SOLN: 1.0000 [drp] | Freq: Every day | OPHTHALMIC | Status: DC

## 2020-10-30 MED ORDER — GLUCERNA SHAKE PO LIQD
237.0000 mL | Freq: Three times a day (TID) | ORAL | Status: DC
  Administered 2020-10-30 – 2020-11-03 (×11): 237 mL via ORAL
  Filled 2020-10-30 (×8): qty 237

## 2020-10-30 MED ORDER — HYDRALAZINE HCL 10 MG PO TABS
10.0000 mg | ORAL_TABLET | Freq: Three times a day (TID) | ORAL | Status: DC
  Administered 2020-10-30 – 2020-11-03 (×12): 10 mg via ORAL
  Filled 2020-10-30 (×14): qty 1

## 2020-10-30 MED ORDER — PSYLLIUM 95 % PO PACK
1.0000 | PACK | Freq: Every day | ORAL | Status: DC
  Administered 2020-10-30 – 2020-11-03 (×4): 1 via ORAL
  Filled 2020-10-30 (×5): qty 1

## 2020-10-30 NOTE — Progress Notes (Signed)
Patient and two daughters is requesting that SS coverage for insulin only be given if CBG is greater than 150. RN was informed that patient's blood sugar drops quick and they are concerned that "patient will get into trouble." RN will continue to monitor.

## 2020-10-30 NOTE — Progress Notes (Signed)
Subjective: 87 y/o female with R femoral neck fracture.  She c/o mod pain in R hip with any motion.  Better with rest.  She also c/o being hungry and wants to know if she can eat.  Per notes, she's scheduled for OR Monday with Dr. Lyla Glassing for R THA.  Her hearing aids are at home.   Objective: Vital signs in last 24 hours: Temp:  [97.5 F (36.4 C)-97.9 F (36.6 C)] 97.5 F (36.4 C) (07/08 2026) Pulse Rate:  [66-87] 66 (07/08 2026) Resp:  [11-32] 16 (07/08 2026) BP: (148-186)/(70-120) 175/74 (07/08 2026) SpO2:  [93 %-100 %] 100 % (07/08 2026) Weight:  [57.6 kg] 57.6 kg (07/08 1029)  Intake/Output from previous day: 07/08 0701 - 07/09 0700 In: 360.4 [I.V.:360.4] Out: 700 [Urine:700] Intake/Output this shift: No intake/output data recorded.  Recent Labs    10/27/20 1159 10/29/20 1037 10/30/20 0114  HGB 12.6 12.9 11.7*   Recent Labs    10/29/20 1037 10/30/20 0114  WBC 14.5* 13.2*  RBC 4.15 3.67*  HCT 41.7 34.7*  PLT 296 240   Recent Labs    10/29/20 1037 10/30/20 0114  NA 136 134*  K 4.7 4.2  CL 106 103  CO2 21* 27  BUN 30* 21  CREATININE 1.11* 1.06*  GLUCOSE 101* 117*  CALCIUM 9.4 9.1   Recent Labs    10/29/20 1037  INR 1.0    PE:  elderly thin female in nad.  HOH.  A and O.  EOMI.  Res punlabored.  R LE shortened.  2+ dp pulse. Feels LT dorsally at the foot.  Wiggles toes with 5/5 strength.    Assessment/Plan: R femoral neck fracture.  Spoke with Dr. Stann Mainland and Dr. Cruzita Lederer about the plan.  She can eat.  Dr. Darnell Level wrote her for a regular diet.  OR Monday at 5 pm.  NWB on R LE.  Pt understands the plan and agrees.     Wylene Simmer 10/30/2020, 8:45 AM

## 2020-10-30 NOTE — Plan of Care (Signed)

## 2020-10-30 NOTE — Progress Notes (Signed)
PROGRESS NOTE  [name] XBD:[id number] DOB: 19-Dec-1922 DOA: 10/29/2020 PCP: [name], MD   LOS: 1 day   Brief Narrative / Interim history: 87 year old female with history of diet-controlled DM2, HTN, HLD, osteoporosis, leukocytosis comes to the hospital after a fall onto the right hip.  She was doing breakfast, opened the dishwasher and lost her balance and fell on the floor.  She could not stand up.  She was brought to the hospital and x-ray was found to have a displaced right femoral neck fracture with varus angulation.  Orthopedic surgery consulted  Subjective / 24h Interval events: Doing well this morning, complains of hip pain with movement.  No chest pain, no shortness of breath.  No abdominal pain, no nausea or vomiting.  Assessment & Plan: Principal Problem Right femoral neck fracture-orthopedic surgery consulted, appreciate input.  She will undergo operative repair on Monday 7/11.  PT following surgery, DVT prophylaxis Lovenox.  Active Problems Chronic kidney disease stage IIIa-Baseline creatinine 1.1-1.2, currently at baseline  Leukocytosis-she has a degree of chronic elevation in her WBC, noted as an outpatient  Osteoporosis-vitamin D low, start high-dose supplementations  Type 2 diabetes mellitus, controlled-sliding scale.  A1c 6.0.  CBG (last 3)  Recent Labs    10/29/20 1727 10/29/20 2028 10/30/20 0620  GLUCAP 202* 122* 100*   Essential hypertension-quite hypertensive, likely in the setting of pain.  Hold ACE inhibitor perioperatively, placed on low-dose hydralazine    Scheduled Meds:  enoxaparin (LOVENOX) injection  40 mg Subcutaneous Q24H   feeding supplement (GLUCERNA SHAKE)  237 mL Oral BID BM   insulin aspart  0-6 Units Subcutaneous TID WC   Vitamin D (Ergocalciferol)  50,000 Units Oral Q7 days   Continuous Infusions: PRN Meds:.HYDROcodone-acetaminophen, morphine injection, ondansetron (ZOFRAN) IV, senna-docusate  Diet Orders (From  admission, onward)     Start     Ordered   10/30/20 0814  Diet regular Room service appropriate? Yes; Fluid consistency: Thin  Diet effective now       Question Answer Comment  Room service appropriate? Yes   Fluid consistency: Thin      10/30/20 0813            DVT prophylaxis: enoxaparin (LOVENOX) injection 40 mg Start: 10/29/20 1530     Code Status: DNR  Family Communication: no family at bedside   Status is: Inpatient  Remains inpatient appropriate because:Inpatient level of care appropriate due to severity of illness  Dispo: The patient is from: Home              Anticipated d/c is to: SNF              Patient currently is not medically stable to d/c.   Difficult to place patient No   Level of care: Med-Surg  Consultants:  Orthopedic surgery   Procedures:  none  Microbiology  none  Antimicrobials: none    Objective: Vitals:   10/29/20 1645 10/29/20 1740 10/29/20 2026 10/30/20 0900  BP: (!) 179/105 (!) 183/71 (!) 175/74 (!) 186/74  Pulse: 77 72 66 76  Resp: 20 17 16 17   Temp:  97.6 F (36.4 C) (!) 97.5 F (36.4 C) 98.1 F (36.7 C)  TempSrc:  Oral Oral Oral  SpO2: 100% 98% 100% 96%  Weight:      Height:        Intake/Output Summary (Last 24 hours) at 10/30/2020 1014 Last data filed at 10/30/2020 0500 Gross per 24 hour  Intake 360.42 ml  Output 700 ml  Net -339.58 ml   Filed Weights   10/29/20 1029  Weight: 57.6 kg    Examination:  Constitutional: NAD Eyes: no scleral icterus ENMT: Mucous membranes are moist.  Neck: normal, supple Respiratory: clear to auscultation bilaterally, no wheezing, no crackles. Normal respiratory effort.  Cardiovascular: Regular rate and rhythm, no murmurs / rubs / gallops. No LE edema.  Abdomen: non distended, no tenderness. Bowel sounds positive.  Musculoskeletal: no clubbing / cyanosis.  Skin: no rashes Neurologic: CN 2-12 grossly intact. Strength 5/5 in all 4.   Data Reviewed: I have independently  reviewed following labs and imaging studies  CBC: Recent Labs  Lab 10/27/20 1159 10/29/20 1037 10/30/20 0114  WBC 11.2* 14.5* 13.2*  NEUTROABS 8.5* 12.3*  --   HGB 12.6 12.9 11.7*  HCT 38.0 41.7 34.7*  MCV 94.8 100.5* 94.6  PLT 301.0 296 440   Basic Metabolic Panel: Recent Labs  Lab 10/29/20 1037 10/30/20 0114  NA 136 134*  K 4.7 4.2  CL 106 103  CO2 21* 27  GLUCOSE 101* 117*  BUN 30* 21  CREATININE 1.11* 1.06*  CALCIUM 9.4 9.1   Liver Function Tests: Recent Labs  Lab 10/29/20 1037  AST 28  ALT 24  ALKPHOS 55  BILITOT 0.9  PROT 6.7  ALBUMIN 3.6   Coagulation Profile: Recent Labs  Lab 10/29/20 1037  INR 1.0   HbA1C: Recent Labs    10/29/20 1749  HGBA1C 6.0*   CBG: Recent Labs  Lab 10/29/20 1727 10/29/20 2028 10/30/20 0620  GLUCAP 202* 122* 100*    Recent Results (from the past 240 hour(s))  Resp Panel by RT-PCR (Flu A&B, Covid) Nasopharyngeal Swab     Status: None   Collection Time: 10/29/20 11:23 AM   Specimen: Nasopharyngeal Swab; Nasopharyngeal(NP) swabs in vial transport medium  Result Value Ref Range Status   SARS Coronavirus 2 by RT PCR NEGATIVE NEGATIVE Final    Comment: (NOTE) SARS-CoV-2 target nucleic acids are NOT DETECTED.  The SARS-CoV-2 RNA is generally detectable in upper respiratory specimens during the acute phase of infection. The lowest concentration of SARS-CoV-2 viral copies this assay can detect is 138 copies/mL. A negative result does not preclude SARS-Cov-2 infection and should not be used as the sole basis for treatment or other patient management decisions. A negative result may occur with  improper specimen collection/handling, submission of specimen other than nasopharyngeal swab, presence of viral mutation(s) within the areas targeted by this assay, and inadequate number of viral copies(<138 copies/mL). A negative result must be combined with clinical observations, patient history, and  epidemiological information. The expected result is Negative.  Fact Sheet for Patients:  EntrepreneurPulse.com.au  Fact Sheet for Healthcare Providers:  IncredibleEmployment.be  This test is no t yet approved or cleared by the [place] FDA and  has been authorized for detection and/or diagnosis of SARS-CoV-2 by FDA under an Emergency Use Authorization (EUA). This EUA will remain  in effect (meaning this test can be used) for the duration of the COVID-19 declaration under Section 564(b)(1) of the Act, 21 U.S.C.section 360bbb-3(b)(1), unless the authorization is terminated  or revoked sooner.       Influenza A by PCR NEGATIVE NEGATIVE Final   Influenza B by PCR NEGATIVE NEGATIVE Final    Comment: (NOTE) The Xpert Xpress SARS-CoV-2/FLU/RSV plus assay is intended as an aid in the diagnosis of influenza from Nasopharyngeal swab specimens and should not be used as a sole basis for treatment. Nasal  washings and aspirates are unacceptable for Xpert Xpress SARS-CoV-2/FLU/RSV testing.  Fact Sheet for Patients: EntrepreneurPulse.com.au  Fact Sheet for Healthcare Providers: IncredibleEmployment.be  This test is not yet approved or cleared by the [place] FDA and has been authorized for detection and/or diagnosis of SARS-CoV-2 by FDA under an Emergency Use Authorization (EUA). This EUA will remain in effect (meaning this test can be used) for the duration of the COVID-19 declaration under Section 564(b)(1) of the Act, 21 U.S.C. section 360bbb-3(b)(1), unless the authorization is terminated or revoked.  Performed at [facility], [place] [address], [place], [name] [address]      Radiology Studies: DG Chest 1 View  Result Date: 10/29/2020 CLINICAL DATA:  Fall. EXAM: CHEST  1 VIEW COMPARISON:  Radiographs 01/28/2020 and 09/12/2019. FINDINGS: 1155 hours. The heart size and mediastinal contours are  stable with mild aortic atherosclerosis. The lungs appear clear. There is no pleural effusion or pneumothorax. No acute fractures are evident. Advanced glenohumeral degenerative changes are present bilaterally. Telemetry leads overlie the chest. IMPRESSION: Stable chest. No evidence of acute injury or active cardiopulmonary process. Electronically Signed   By: Richardean Sale M.D.   On: 10/29/2020 12:26   DG HIP UNILAT WITH PELVIS 2-3 VIEWS RIGHT  Result Date: 10/29/2020 CLINICAL DATA:  Hip pain EXAM: DG HIP (WITH OR WITHOUT PELVIS) 2-3V RIGHT COMPARISON:  Radiograph 07/08/2019 FINDINGS: There is a displaced subcapital/transcervical femoral neck fracture with varus angulation. Adjacent soft tissue swelling. Osteopenia. There is moderate bilateral hip osteoarthritis. Bilateral SI joint and lower lumbar spine degenerative change. IMPRESSION: Displaced right femoral neck fracture with varus angulation. Electronically Signed   By: Maurine Simmering   On: 10/29/2020 12:27    Marzetta Board, MD, PhD [facility]  Between 7 am - 7 pm I am available, please contact me via Amion (for emergencies) or Securechat (non urgent messages)  Between 7 pm - 7 am I am not available, please contact night coverage MD/APP via Amion

## 2020-10-30 NOTE — Plan of Care (Signed)
  Problem: Safety: Goal: Ability to remain free from injury will improve Outcome: Progressing   Problem: Skin Integrity: Goal: Risk for impaired skin integrity will decrease Outcome: Progressing   Problem: Activity: Goal: Ability to ambulate and perform ADLs will improve Outcome: Progressing   Problem: Pain Management: Goal: Pain level will decrease Outcome: Progressing

## 2020-10-30 NOTE — Progress Notes (Signed)
Initial Nutrition Assessment  DOCUMENTATION CODES:   Not applicable  INTERVENTION:   Increase Glucerna Shake to TID between meals, each supplement provides 220 kcal and 10 grams of protein Add MVI with minerals daily Continue vitamin D supplementation  NUTRITION DIAGNOSIS:   Increased nutrient needs related to hip fracture, post-op healing as evidenced by estimated needs.  GOAL:   Patient will meet greater than or equal to 90% of their needs  MONITOR:   PO intake, Supplement acceptance  REASON FOR ASSESSMENT:   Consult Hip fracture protocol  ASSESSMENT:   87 yo female admitted with R femoral neck fracture. PMH includes HLD, HTN, DM, GERD, osteopenia, osteoarthritis, degenerative cervical disc.  Plans for right THA surgery on Monday, 7/11. RD working remotely. No answer when phone number called. Unable to obtain any nutrition history.  Patient will increased nutrient needs to support healing. Currently on a regular diet. Meal intakes not recorded. She is receiving Glucerna Shake supplements BID.   Labs reviewed. Na 134; vitamin D, 25-Hydroxy 28.27; A1C 6 CBG: 202-122-100  Medications reviewed and include novolog, ergocalciferol 50,000 units weekly.  Usual weights reviewed. Weight has trended up slightly over the past year.   NUTRITION - FOCUSED PHYSICAL EXAM:  Unable to complete  Diet Order:   Diet Order             Diet regular Room service appropriate? Yes; Fluid consistency: Thin  Diet effective now                   EDUCATION NEEDS:   No education needs have been identified at this time  Skin:  Skin Assessment: Reviewed RN Assessment  Last BM:  7/8 type 6  Height:   Ht Readings from Last 1 Encounters:  10/29/20 5\' 3"  (1.6 m)    Weight:   Wt Readings from Last 1 Encounters:  10/29/20 57.6 kg    Ideal Body Weight:  52.3 kg  BMI:  Body mass index is 22.49 kg/m.  Estimated Nutritional Needs:   Kcal:  1400-1600  Protein:  70-80  gm  Fluid:  >/= 1.5 L    Lucas Mallow, RD, LDN, CNSC Please refer to Amion for contact information.

## 2020-10-31 LAB — BASIC METABOLIC PANEL
Anion gap: 9 (ref 5–15)
BUN: 18 mg/dL (ref 8–23)
CO2: 28 mmol/L (ref 22–32)
Calcium: 9.7 mg/dL (ref 8.9–10.3)
Chloride: 96 mmol/L — ABNORMAL LOW (ref 98–111)
Creatinine, Ser: 0.99 mg/dL (ref 0.44–1.00)
GFR, Estimated: 53 mL/min — ABNORMAL LOW (ref >60)
Glucose, Bld: 141 mg/dL — ABNORMAL HIGH (ref 70–99)
Potassium: 3.9 mmol/L (ref 3.5–5.1)
Sodium: 133 mmol/L — ABNORMAL LOW (ref 135–145)

## 2020-10-31 LAB — GLUCOSE, CAPILLARY
Glucose-Capillary: 133 mg/dL — ABNORMAL HIGH (ref 70–99)
Glucose-Capillary: 153 mg/dL — ABNORMAL HIGH (ref 70–99)
Glucose-Capillary: 149 mg/dL — ABNORMAL HIGH (ref 70–99)
Glucose-Capillary: 165 mg/dL — ABNORMAL HIGH (ref 70–99)

## 2020-10-31 LAB — CBC
HCT: 40.2 % (ref 36.0–46.0)
Hemoglobin: 13.3 g/dL (ref 12.0–15.0)
MCH: 31.7 pg (ref 26.0–34.0)
MCHC: 33.1 g/dL (ref 30.0–36.0)
MCV: 95.9 fL (ref 80.0–100.0)
Platelets: 254 10*3/uL (ref 150–400)
RBC: 4.19 MIL/uL (ref 3.87–5.11)
RDW: 13.7 % (ref 11.5–15.5)
WBC: 15.5 10*3/uL — ABNORMAL HIGH (ref 4.0–10.5)
nRBC: 0 % (ref 0.0–0.2)

## 2020-10-31 LAB — SURGICAL PCR SCREEN
MRSA, PCR: NEGATIVE
Staphylococcus aureus: NEGATIVE

## 2020-10-31 MED ORDER — CEFAZOLIN SODIUM-DEXTROSE 2-4 GM/100ML-% IV SOLN
2.0000 g | INTRAVENOUS | Status: AC
  Administered 2020-11-01: 2 g via INTRAVENOUS
  Filled 2020-10-31: qty 100

## 2020-10-31 MED ORDER — TRANEXAMIC ACID-NACL 1000-0.7 MG/100ML-% IV SOLN
1000.0000 mg | INTRAVENOUS | Status: AC
  Administered 2020-11-01: 1000 mg via INTRAVENOUS
  Filled 2020-10-31: qty 100

## 2020-10-31 MED ORDER — POVIDONE-IODINE 10 % EX SWAB
2.0000 "application " | Freq: Once | CUTANEOUS | Status: AC
  Administered 2020-11-01: 2 via TOPICAL

## 2020-10-31 MED ORDER — CHLORHEXIDINE GLUCONATE 4 % EX LIQD: 60.0000 mL | Freq: Once | CUTANEOUS | Status: DC

## 2020-10-31 NOTE — Plan of Care (Signed)

## 2020-10-31 NOTE — Progress Notes (Addendum)
Patient ID: [name], female   DOB: 1923-03-23, 87 y.o.   MRN: [id number] R hip fx To OR tomorrow  for R THA - NPO after MN for tomorrow Anticoags will need to be held accordingly

## 2020-10-31 NOTE — Plan of Care (Signed)
  Problem: Pain Managment: Goal: General experience of comfort will improve Outcome: Progressing   Problem: Safety: Goal: Ability to remain free from injury will improve Outcome: Progressing   Problem: Skin Integrity: Goal: Risk for impaired skin integrity will decrease Outcome: Progressing   Problem: Activity: Goal: Ability to ambulate and perform ADLs will improve Outcome: Progressing   Problem: Pain Management: Goal: Pain level will decrease Outcome: Progressing   

## 2020-10-31 NOTE — TOC CAGE-AID Note (Signed)
Transition of Care [facility]) - CAGE-AID Screening   Patient Details  Name: [name] MRN: [id number] Date of Birth: 19-Apr-1923  [facility]) CM/SW Contact:    Clovis Cao, RN Phone Number: [phone] 10/31/2020, 2:49 PM   Clinical Narrative: Pt denies alcohol or drug use.   CAGE-AID Screening:    Have You Ever Felt You Ought to Cut Down on Your Drinking or Drug Use?: No Have People Annoyed You By Critizing Your Drinking Or Drug Use?: No Have You Felt Bad Or Guilty About Your Drinking Or Drug Use?: No Have You Ever Had a Drink or Used Drugs First Thing In The Morning to Steady Your Nerves or to Get Rid of a Hangover?: No CAGE-AID Score: 0  Substance Abuse Education Offered: No

## 2020-10-31 NOTE — Progress Notes (Signed)
PROGRESS NOTE  [name] AXK:[id number] DOB: 22-Apr-1923 DOA: 10/29/2020 PCP: [name], MD   LOS: 2 days   Brief Narrative / Interim history: 87 year old female with history of diet-controlled DM2, HTN, HLD, osteoporosis, leukocytosis comes to the hospital after a fall onto the right hip.  She was doing breakfast, opened the dishwasher and lost her balance and fell on the floor.  She could not stand up.  She was brought to the hospital and x-ray was found to have a displaced right femoral neck fracture with varus angulation.  Orthopedic surgery consulted  Subjective / 24h Interval events: No complaints, no chest pain, no shortness of breath.  Assessment & Plan: Principal Problem Right femoral neck fracture-orthopedic surgery consulted, appreciate input.  To go to the OR tomorrow 7/11.  PT postop, DVT prophylaxis per Ortho  Active Problems Chronic kidney disease stage IIIa-Baseline creatinine 1.1-1.2, currently at baseline  Leukocytosis-she has a degree of chronic elevation in her WBC, noted as an outpatient.  WBC 15 this morning, slightly higher.  Possibly reactive due to her hip fracture.  No fever, no chills, no dysuria, no cough  Osteoporosis-vitamin D low, started on high-dose supplementation  Type 2 diabetes mellitus, controlled-sliding scale.  A1c 6.0.  CBGs controlled  CBG (last 3)  Recent Labs    10/30/20 2021 10/31/20 0640 10/31/20 0826  GLUCAP 140* 133* 153*    Essential hypertension-quite hypertensive, likely in the setting of pain.  Hold ACE inhibitor perioperatively, placed on low-dose hydralazine.  Blood pressure still high today but overall better than yesterday    Scheduled Meds:  enoxaparin (LOVENOX) injection  30 mg Subcutaneous Q24H   feeding supplement (GLUCERNA SHAKE)  237 mL Oral TID BM   hydrALAZINE  10 mg Oral Q8H   insulin aspart  0-6 Units Subcutaneous TID WC   loratadine  5 mg Oral Daily   multivitamin with minerals  1 tablet Oral Daily    polyvinyl alcohol  1-2 drop Both Eyes Daily   psyllium  1 packet Oral Daily   Vitamin D (Ergocalciferol)  50,000 Units Oral Q7 days   Continuous Infusions: PRN Meds:.HYDROcodone-acetaminophen, morphine injection, ondansetron (ZOFRAN) IV, senna-docusate  Diet Orders (From admission, onward)     Start     Ordered   11/01/20 0001  Diet NPO time specified  Diet effective midnight        10/31/20 0851   10/30/20 0814  Diet regular Room service appropriate? Yes; Fluid consistency: Thin  Diet effective now       Question Answer Comment  Room service appropriate? Yes   Fluid consistency: Thin      10/30/20 0813            DVT prophylaxis: enoxaparin (LOVENOX) injection 30 mg Start: 10/30/20 1530     Code Status: DNR  Family Communication: no family at bedside   Status is: Inpatient  Remains inpatient appropriate because:Inpatient level of care appropriate due to severity of illness  Dispo: The patient is from: Home              Anticipated d/c is to: SNF              Patient currently is not medically stable to d/c.   Difficult to place patient No   Level of care: Med-Surg  Consultants:  Orthopedic surgery   Procedures:  none  Microbiology  none  Antimicrobials: none    Objective: Vitals:   10/30/20 1236 10/30/20 2053 10/30/20 2129 10/31/20 0736  BP: (!) 178/88 (!) 156/76  (!) 173/120  Pulse: 77 79  79  Resp: 18 17  14   Temp: 97.9 F (36.6 C) 98 F (36.7 C)  97.7 F (36.5 C)  TempSrc: Oral Oral  Oral  SpO2: 97% 98% 98% 98%  Weight:      Height:        Intake/Output Summary (Last 24 hours) at 10/31/2020 1141 Last data filed at 10/31/2020 0300 Gross per 24 hour  Intake --  Output 1300 ml  Net -1300 ml    Filed Weights   10/29/20 1029  Weight: 57.6 kg    Examination:  Constitutional: NAD Eyes: No icterus ENMT: mmm Neck: normal, supple Respiratory: Clear bilaterally, no wheezing or crackles Cardiovascular: Regular rate and rhythm, no  murmurs Abdomen: Soft, NT, ND, bowel sounds positive Musculoskeletal: no clubbing / cyanosis.  Skin: No rashes Neurologic: Nonfocal  Data Reviewed: I have independently reviewed following labs and imaging studies  CBC: Recent Labs  Lab 10/27/20 1159 10/29/20 1037 10/30/20 0114 10/31/20 0332  WBC 11.2* 14.5* 13.2* 15.5*  NEUTROABS 8.5* 12.3*  --   --   HGB 12.6 12.9 11.7* 13.3  HCT 38.0 41.7 34.7* 40.2  MCV 94.8 100.5* 94.6 95.9  PLT 301.0 296 240 767    Basic Metabolic Panel: Recent Labs  Lab 10/29/20 1037 10/30/20 0114 10/31/20 0332  NA 136 134* 133*  K 4.7 4.2 3.9  CL 106 103 96*  CO2 21* 27 28  GLUCOSE 101* 117* 141*  BUN 30* 21 18  CREATININE 1.11* 1.06* 0.99  CALCIUM 9.4 9.1 9.7    Liver Function Tests: Recent Labs  Lab 10/29/20 1037  AST 28  ALT 24  ALKPHOS 55  BILITOT 0.9  PROT 6.7  ALBUMIN 3.6    Coagulation Profile: Recent Labs  Lab 10/29/20 1037  INR 1.0    HbA1C: Recent Labs    10/29/20 1749  HGBA1C 6.0*    CBG: Recent Labs  Lab 10/30/20 1233 10/30/20 1829 10/30/20 2021 10/31/20 0640 10/31/20 0826  GLUCAP 158* 142* 140* 133* 153*     Recent Results (from the past 240 hour(s))  Resp Panel by RT-PCR (Flu A&B, Covid) Nasopharyngeal Swab     Status: None   Collection Time: 10/29/20 11:23 AM   Specimen: Nasopharyngeal Swab; Nasopharyngeal(NP) swabs in vial transport medium  Result Value Ref Range Status   SARS Coronavirus 2 by RT PCR NEGATIVE NEGATIVE Final    Comment: (NOTE) SARS-CoV-2 target nucleic acids are NOT DETECTED.  The SARS-CoV-2 RNA is generally detectable in upper respiratory specimens during the acute phase of infection. The lowest concentration of SARS-CoV-2 viral copies this assay can detect is 138 copies/mL. A negative result does not preclude SARS-Cov-2 infection and should not be used as the sole basis for treatment or other patient management decisions. A negative result may occur with  improper  specimen collection/handling, submission of specimen other than nasopharyngeal swab, presence of viral mutation(s) within the areas targeted by this assay, and inadequate number of viral copies(<138 copies/mL). A negative result must be combined with clinical observations, patient history, and epidemiological information. The expected result is Negative.  Fact Sheet for Patients:  EntrepreneurPulse.com.au  Fact Sheet for Healthcare Providers:  IncredibleEmployment.be  This test is no t yet approved or cleared by the [place] FDA and  has been authorized for detection and/or diagnosis of SARS-CoV-2 by FDA under an Emergency Use Authorization (EUA). This EUA will remain  in effect (meaning this test  can be used) for the duration of the COVID-19 declaration under Section 564(b)(1) of the Act, 21 U.S.C.section 360bbb-3(b)(1), unless the authorization is terminated  or revoked sooner.       Influenza A by PCR NEGATIVE NEGATIVE Final   Influenza B by PCR NEGATIVE NEGATIVE Final    Comment: (NOTE) The Xpert Xpress SARS-CoV-2/FLU/RSV plus assay is intended as an aid in the diagnosis of influenza from Nasopharyngeal swab specimens and should not be used as a sole basis for treatment. Nasal washings and aspirates are unacceptable for Xpert Xpress SARS-CoV-2/FLU/RSV testing.  Fact Sheet for Patients: EntrepreneurPulse.com.au  Fact Sheet for Healthcare Providers: IncredibleEmployment.be  This test is not yet approved or cleared by the [place] FDA and has been authorized for detection and/or diagnosis of SARS-CoV-2 by FDA under an Emergency Use Authorization (EUA). This EUA will remain in effect (meaning this test can be used) for the duration of the COVID-19 declaration under Section 564(b)(1) of the Act, 21 U.S.C. section 360bbb-3(b)(1), unless the authorization is terminated or revoked.  Performed at  [facility], [place] [address], [place], [place] [address]       Radiology Studies: No results found.  Marzetta Board, MD, PhD [facility]  Between 7 am - 7 pm I am available, please contact me via Amion (for emergencies) or Securechat (non urgent messages)  Between 7 pm - 7 am I am not available, please contact night coverage MD/APP via Amion

## 2020-11-01 ENCOUNTER — Inpatient Hospital Stay (HOSPITAL_COMMUNITY): Payer: Medicare Other

## 2020-11-01 ENCOUNTER — Inpatient Hospital Stay (HOSPITAL_COMMUNITY): Payer: Medicare Other | Admitting: Certified Registered Nurse Anesthetist

## 2020-11-01 ENCOUNTER — Encounter (HOSPITAL_COMMUNITY): Payer: Self-pay | Admitting: Family Medicine

## 2020-11-01 ENCOUNTER — Encounter (HOSPITAL_COMMUNITY)
Admission: EM | Disposition: A | Discharge status: Skilled Nursing Facility | Payer: Self-pay | Source: Home / Self Care | Attending: Internal Medicine

## 2020-11-01 DIAGNOSIS — S72009A Fracture of unspecified part of neck of unspecified femur, initial encounter for closed fracture: Secondary | ICD-10-CM | POA: Diagnosis present

## 2020-11-01 HISTORY — PX: TOTAL HIP ARTHROPLASTY: SHX124

## 2020-11-01 LAB — CBC
HCT: 34.3 % — ABNORMAL LOW (ref 36.0–46.0)
Hemoglobin: 11.6 g/dL — ABNORMAL LOW (ref 12.0–15.0)
MCH: 31.7 pg (ref 26.0–34.0)
MCHC: 33.8 g/dL (ref 30.0–36.0)
MCV: 93.7 fL (ref 80.0–100.0)
Platelets: 220 10*3/uL (ref 150–400)
RBC: 3.66 MIL/uL — ABNORMAL LOW (ref 3.87–5.11)
RDW: 13.6 % (ref 11.5–15.5)
WBC: 14 10*3/uL — ABNORMAL HIGH (ref 4.0–10.5)
nRBC: 0 % (ref 0.0–0.2)

## 2020-11-01 LAB — GLUCOSE, CAPILLARY
Glucose-Capillary: 135 mg/dL — ABNORMAL HIGH (ref 70–99)
Glucose-Capillary: 135 mg/dL — ABNORMAL HIGH (ref 70–99)
Glucose-Capillary: 135 mg/dL — ABNORMAL HIGH (ref 70–99)
Glucose-Capillary: 139 mg/dL — ABNORMAL HIGH (ref 70–99)
Glucose-Capillary: 165 mg/dL — ABNORMAL HIGH (ref 70–99)
Glucose-Capillary: 190 mg/dL — ABNORMAL HIGH (ref 70–99)

## 2020-11-01 LAB — BASIC METABOLIC PANEL
Anion gap: 8 (ref 5–15)
BUN: 19 mg/dL (ref 8–23)
CO2: 25 mmol/L (ref 22–32)
Calcium: 8.8 mg/dL — ABNORMAL LOW (ref 8.9–10.3)
Chloride: 99 mmol/L (ref 98–111)
Creatinine, Ser: 0.9 mg/dL (ref 0.44–1.00)
GFR, Estimated: 59 mL/min — ABNORMAL LOW (ref >60)
Glucose, Bld: 153 mg/dL — ABNORMAL HIGH (ref 70–99)
Potassium: 3.8 mmol/L (ref 3.5–5.1)
Sodium: 132 mmol/L — ABNORMAL LOW (ref 135–145)

## 2020-11-01 LAB — TYPE AND SCREEN
ABO/RH(D): B POS
Antibody Screen: NEGATIVE

## 2020-11-01 LAB — ABO/RH: ABO/RH(D): B POS

## 2020-11-01 SURGERY — ARTHROPLASTY, HIP, TOTAL, ANTERIOR APPROACH
Anesthesia: General | Site: Hip | Laterality: Right

## 2020-11-01 MED ORDER — MAGNESIUM CITRATE PO SOLN: 1.0000 | Freq: Once | ORAL | Status: DC | PRN

## 2020-11-01 MED ORDER — FENTANYL CITRATE (PF) 100 MCG/2ML IJ SOLN
INTRAMUSCULAR | Status: AC
  Filled 2020-11-01: qty 2

## 2020-11-01 MED ORDER — LACTATED RINGERS IV SOLN: INTRAVENOUS | Status: DC

## 2020-11-01 MED ORDER — DEXAMETHASONE SODIUM PHOSPHATE 10 MG/ML IJ SOLN
10.0000 mg | Freq: Once | INTRAMUSCULAR | Status: AC
  Administered 2020-11-02: 10 mg via INTRAVENOUS
  Filled 2020-11-01: qty 1

## 2020-11-01 MED ORDER — BISACODYL 10 MG RE SUPP: 10.0000 mg | Freq: Every day | RECTAL | Status: DC | PRN

## 2020-11-01 MED ORDER — PHENOL 1.4 % MT LIQD: 1.0000 | OROMUCOSAL | Status: DC | PRN

## 2020-11-01 MED ORDER — ACETAMINOPHEN 325 MG PO TABS
325.0000 mg | ORAL_TABLET | Freq: Four times a day (QID) | ORAL | Status: DC | PRN
  Administered 2020-11-02 – 2020-11-03 (×4): 650 mg via ORAL
  Filled 2020-11-01 (×4): qty 2

## 2020-11-01 MED ORDER — 0.9 % SODIUM CHLORIDE (POUR BTL) OPTIME
TOPICAL | Status: DC | PRN
  Administered 2020-11-01: 1000 mL

## 2020-11-01 MED ORDER — ONDANSETRON HCL 4 MG/2ML IJ SOLN: 4.0000 mg | Freq: Four times a day (QID) | INTRAMUSCULAR | Status: DC | PRN

## 2020-11-01 MED ORDER — CEFAZOLIN SODIUM-DEXTROSE 2-4 GM/100ML-% IV SOLN
2.0000 g | Freq: Four times a day (QID) | INTRAVENOUS | Status: DC
  Administered 2020-11-01: 2 g via INTRAVENOUS
  Filled 2020-11-01 (×2): qty 100

## 2020-11-01 MED ORDER — PHENYLEPHRINE HCL-NACL 10-0.9 MG/250ML-% IV SOLN
INTRAVENOUS | Status: DC | PRN
  Administered 2020-11-01: 25 ug/min via INTRAVENOUS

## 2020-11-01 MED ORDER — LIDOCAINE HCL (CARDIAC) PF 100 MG/5ML IV SOSY
PREFILLED_SYRINGE | INTRAVENOUS | Status: DC | PRN
  Administered 2020-11-01: 20 mg via INTRAVENOUS

## 2020-11-01 MED ORDER — SODIUM CHLORIDE 0.9 % IV SOLN
2.0000 g | Freq: Four times a day (QID) | INTRAVENOUS | Status: AC
  Administered 2020-11-02: 2 g via INTRAVENOUS
  Filled 2020-11-01: qty 2

## 2020-11-01 MED ORDER — POLYETHYLENE GLYCOL 3350 17 G PO PACK: 17.0000 g | PACK | Freq: Every day | ORAL | Status: DC | PRN

## 2020-11-01 MED ORDER — TRAMADOL HCL 50 MG PO TABS
50.0000 mg | ORAL_TABLET | Freq: Four times a day (QID) | ORAL | Status: DC | PRN
Start: 2020-11-01 — End: 2020-11-04

## 2020-11-01 MED ORDER — ONDANSETRON HCL 4 MG/2ML IJ SOLN
INTRAMUSCULAR | Status: DC | PRN
  Administered 2020-11-01: 4 mg via INTRAVENOUS

## 2020-11-01 MED ORDER — ONDANSETRON HCL 4 MG PO TABS: 4.0000 mg | ORAL_TABLET | Freq: Four times a day (QID) | ORAL | Status: DC | PRN

## 2020-11-01 MED ORDER — METOCLOPRAMIDE HCL 5 MG PO TABS: 5.0000 mg | ORAL_TABLET | Freq: Three times a day (TID) | ORAL | Status: DC | PRN

## 2020-11-01 MED ORDER — ASPIRIN EC 81 MG PO TBEC
81.0000 mg | DELAYED_RELEASE_TABLET | Freq: Two times a day (BID) | ORAL | Status: DC
  Administered 2020-11-02 – 2020-11-03 (×3): 81 mg via ORAL
  Filled 2020-11-01 (×4): qty 1

## 2020-11-01 MED ORDER — MENTHOL 3 MG MT LOZG: 1.0000 | LOZENGE | OROMUCOSAL | Status: DC | PRN

## 2020-11-01 MED ORDER — METOCLOPRAMIDE HCL 5 MG/ML IJ SOLN: 5.0000 mg | Freq: Three times a day (TID) | INTRAMUSCULAR | Status: DC | PRN

## 2020-11-01 MED ORDER — LIP MEDEX EX OINT
TOPICAL_OINTMENT | CUTANEOUS | Status: AC
  Filled 2020-11-01: qty 7

## 2020-11-01 MED ORDER — DOCUSATE SODIUM 100 MG PO CAPS
100.0000 mg | ORAL_CAPSULE | Freq: Two times a day (BID) | ORAL | Status: DC
  Administered 2020-11-01 – 2020-11-03 (×4): 100 mg via ORAL
  Filled 2020-11-01 (×4): qty 1

## 2020-11-01 MED ORDER — WATER FOR IRRIGATION, STERILE IR SOLN
Status: DC | PRN
  Administered 2020-11-01: 2000 mL

## 2020-11-01 MED ORDER — METHOCARBAMOL 500 MG PO TABS: 500.0000 mg | ORAL_TABLET | Freq: Four times a day (QID) | ORAL | Status: DC | PRN

## 2020-11-01 MED ORDER — BUPIVACAINE-EPINEPHRINE (PF) 0.25% -1:200000 IJ SOLN
INTRAMUSCULAR | Status: DC | PRN
  Administered 2020-11-01: 30 mL

## 2020-11-01 MED ORDER — PHENYLEPHRINE 40 MCG/ML (10ML) SYRINGE FOR IV PUSH (FOR BLOOD PRESSURE SUPPORT)
PREFILLED_SYRINGE | INTRAVENOUS | Status: DC | PRN
  Administered 2020-11-01 (×3): 40 ug via INTRAVENOUS

## 2020-11-01 MED ORDER — SODIUM CHLORIDE 0.9 % IV SOLN: INTRAVENOUS | Status: DC

## 2020-11-01 MED ORDER — FENTANYL CITRATE (PF) 100 MCG/2ML IJ SOLN
INTRAMUSCULAR | Status: DC | PRN
  Administered 2020-11-01: 25 ug via INTRAVENOUS
  Administered 2020-11-01: 50 ug via INTRAVENOUS
  Administered 2020-11-01: 25 ug via INTRAVENOUS
  Administered 2020-11-01: 50 ug via INTRAVENOUS

## 2020-11-01 MED ORDER — FENTANYL CITRATE (PF) 100 MCG/2ML IJ SOLN: 25.0000 ug | INTRAMUSCULAR | Status: DC | PRN

## 2020-11-01 MED ORDER — PROPOFOL 10 MG/ML IV BOLUS
INTRAVENOUS | Status: DC | PRN
  Administered 2020-11-01: 30 mg via INTRAVENOUS
  Administered 2020-11-01: 70 mg via INTRAVENOUS

## 2020-11-01 MED ORDER — DEXAMETHASONE SODIUM PHOSPHATE 10 MG/ML IJ SOLN
INTRAMUSCULAR | Status: DC | PRN
  Administered 2020-11-01: 5 mg via INTRAVENOUS

## 2020-11-01 MED ORDER — MORPHINE SULFATE (PF) 4 MG/ML IV SOLN: 0.5000 mg | INTRAVENOUS | Status: DC | PRN

## 2020-11-01 MED ORDER — METHOCARBAMOL 500 MG IVPB - SIMPLE MED
500.0000 mg | Freq: Four times a day (QID) | INTRAVENOUS | Status: DC | PRN
  Filled 2020-11-01: qty 50

## 2020-11-01 MED ORDER — HYDROCODONE-ACETAMINOPHEN 5-325 MG PO TABS: 1.0000 | ORAL_TABLET | ORAL | Status: DC | PRN

## 2020-11-01 MED ORDER — BUPIVACAINE-EPINEPHRINE (PF) 0.25% -1:200000 IJ SOLN
INTRAMUSCULAR | Status: AC
  Filled 2020-11-01: qty 30

## 2020-11-01 SURGICAL SUPPLY — 44 items
BAG COUNTER SPONGE SURGICOUNT (BAG) IMPLANT
BAG DECANTER FOR FLEXI CONT (MISCELLANEOUS) IMPLANT
BAG SPEC THK2 15X12 ZIP CLS (MISCELLANEOUS)
BAG SPNG CNTER NS LX DISP (BAG)
BAG ZIPLOCK 12X15 (MISCELLANEOUS) IMPLANT
BLADE SAG 18X100X1.27 (BLADE) ×2 IMPLANT
CLSR STERI-STRIP ANTIMIC 1/2X4 (GAUZE/BANDAGES/DRESSINGS) ×1 IMPLANT
COVER PERINEAL POST (MISCELLANEOUS) ×2 IMPLANT
COVER SURGICAL LIGHT HANDLE (MISCELLANEOUS) ×2 IMPLANT
CUP ACETBLR 48 OD SECTOR II (Hips) ×1 IMPLANT
DECANTER SPIKE VIAL GLASS SM (MISCELLANEOUS) ×2 IMPLANT
DRAPE FOOT SWITCH (DRAPES) ×2 IMPLANT
DRAPE STERI IOBAN 125X83 (DRAPES) ×2 IMPLANT
DRAPE U-SHAPE 47X51 STRL (DRAPES) ×4 IMPLANT
DRSG AQUACEL AG ADV 3.5X10 (GAUZE/BANDAGES/DRESSINGS) ×2 IMPLANT
DURAPREP 26ML APPLICATOR (WOUND CARE) ×2 IMPLANT
ELECT REM PT RETURN 15FT ADLT (MISCELLANEOUS) ×2 IMPLANT
GLOVE SRG 8 PF TXTR STRL LF DI (GLOVE) ×1 IMPLANT
GLOVE SURG ENC MOIS LTX SZ6.5 (GLOVE) ×2 IMPLANT
GLOVE SURG ENC MOIS LTX SZ7 (GLOVE) ×2 IMPLANT
GLOVE SURG ENC MOIS LTX SZ8 (GLOVE) ×4 IMPLANT
GLOVE SURG UNDER POLY LF SZ7 (GLOVE) ×2 IMPLANT
GLOVE SURG UNDER POLY LF SZ8 (GLOVE) ×2
GLOVE SURG UNDER POLY LF SZ8.5 (GLOVE) IMPLANT
GOWN STRL REUS W/TWL LRG LVL3 (GOWN DISPOSABLE) ×4 IMPLANT
GOWN STRL REUS W/TWL XL LVL3 (GOWN DISPOSABLE) IMPLANT
HEAD FEM STD 28X+1.5 STRL (Hips) ×1 IMPLANT
HOLDER FOLEY CATH W/STRAP (MISCELLANEOUS) ×2 IMPLANT
KIT TURNOVER KIT A (KITS) ×2 IMPLANT
LINER MARATHON 28 48 (Hips) ×1 IMPLANT
MANIFOLD NEPTUNE II (INSTRUMENTS) ×2 IMPLANT
PACK ANTERIOR HIP CUSTOM (KITS) ×2 IMPLANT
PENCIL SMOKE EVACUATOR COATED (MISCELLANEOUS) ×2 IMPLANT
STEM FEMORAL SZ 5MM STD ACTIS (Stem) ×1 IMPLANT
STRIP CLOSURE SKIN 1/2X4 (GAUZE/BANDAGES/DRESSINGS) ×2 IMPLANT
SUT ETHIBOND NAB CT1 #1 30IN (SUTURE) ×2 IMPLANT
SUT MNCRL AB 4-0 PS2 18 (SUTURE) ×2 IMPLANT
SUT STRATAFIX 0 PDS 27 VIOLET (SUTURE) ×2
SUT VIC AB 2-0 CT1 27 (SUTURE) ×4
SUT VIC AB 2-0 CT1 TAPERPNT 27 (SUTURE) ×2 IMPLANT
SUTURE STRATFX 0 PDS 27 VIOLET (SUTURE) ×1 IMPLANT
SYR 50ML LL SCALE MARK (SYRINGE) IMPLANT
TRAY FOLEY MTR SLVR 16FR STAT (SET/KITS/TRAYS/PACK) ×2 IMPLANT
TUBE SUCTION HIGH CAP CLEAR NV (SUCTIONS) ×2 IMPLANT

## 2020-11-01 NOTE — Anesthesia Preprocedure Evaluation (Addendum)
Anesthesia Evaluation  Patient identified by MRN, date of birth, ID band Patient awake    Reviewed: Allergy & Precautions, NPO status , Patient's Chart, lab work & pertinent test results  History of Anesthesia Complications Negative for: history of anesthetic complications  Airway Mallampati: II  TM Distance: >3 FB Neck ROM: Full    Dental  (+) Partial Upper, Dental Advisory Given   Pulmonary former smoker,    breath sounds clear to auscultation       Cardiovascular hypertension, Pt. on medications (-) angina Rhythm:Regular Rate:Normal  '14 ECHO: stress test was normal   Neuro/Psych TIA   GI/Hepatic Neg liver ROS, GERD  Controlled,  Endo/Other  diabetes (glu 139, diet controlled)  Renal/GU negative Renal ROS     Musculoskeletal  (+) Arthritis ,   Abdominal   Peds  Hematology negative hematology ROS (+)   Anesthesia Other Findings   Reproductive/Obstetrics                            Anesthesia Physical Anesthesia Plan  ASA: 3  Anesthesia Plan: General   Post-op Pain Management:    Induction: Intravenous  PONV Risk Score and Plan: 3 and Ondansetron, Dexamethasone and Treatment may vary due to age or medical condition  Airway Management Planned: LMA  Additional Equipment: None  Intra-op Plan:   Post-operative Plan:   Informed Consent: I have reviewed the patients History and Physical, chart, labs and discussed the procedure including the risks, benefits and alternatives for the proposed anesthesia with the patient or authorized representative who has indicated his/her understanding and acceptance.   Patient has DNR.  Discussed DNR with patient, Suspend DNR and Discussed DNR with power of attorney.   Dental advisory given and Consent reviewed with POA  Plan Discussed with: CRNA and Surgeon  Anesthesia Plan Comments: (Discussed with patient and her daughter)        Anesthesia Quick Evaluation

## 2020-11-01 NOTE — Op Note (Signed)
OPERATIVE REPORT- TOTAL HIP ARTHROPLASTY   PREOPERATIVE DIAGNOSIS: Right femoral neck fracture  POSTOPERATIVE DIAGNOSIS: Right femoral neck fracture  PROCEDURE: Right total hip arthroplasty, anterior approach.   SURGEON: Gaynelle Arabian, MD   ASSISTANT: Theresa Duty, PA-C  ANESTHESIA:  General  ESTIMATED BLOOD LOSS:-150 mL    DRAINS: Hemovac x1.   COMPLICATIONS: None   CONDITION: PACU - hemodynamically stable.   BRIEF CLINICAL NOTE: [name] is a 87 y.o. female who had a fall at home 3 days ago with a displaced right femoral neck fracture. She presents for right Total Hip Arthroplasty  PROCEDURE IN DETAIL: After successful administration of spinal  anesthetic, the traction boots for the [facility] bed were placed on both  feet and the patient was placed onto the [facility] bed, boots placed into the leg  holders. The Right hip was then isolated from the perineum with plastic  drapes and prepped and draped in the usual sterile fashion. ASIS and  greater trochanter were marked and a oblique incision was made, starting  at about 1 cm lateral and 2 cm distal to the ASIS and coursing towards  the anterior cortex of the femur. The skin was cut with a 10 blade  through subcutaneous tissue to the level of the fascia overlying the  tensor fascia lata muscle. The fascia was then incised in line with the  incision at the junction of the anterior third and posterior 2/3rd. The  muscle was teased off the fascia and then the interval between the TFL  and the rectus was developed. The Hohmann retractor was then placed at  the top of the femoral neck over the capsule. The vessels overlying the  capsule were cauterized and the fat on top of the capsule was removed.  A Hohmann retractor was then placed anterior underneath the rectus  femoris to give exposure to the entire anterior capsule. A T-shaped  capsulotomy was performed. The edges were tagged and the femoral head  was  identified.       Osteophytes are removed off the superior acetabulum.  The femoral neck was then cut in situ with an oscillating saw. Traction  was then applied to the left lower extremity utilizing the [facility]  traction. The femoral head was then removed. Retractors were placed  around the acetabulum and then circumferential removal of the labrum was  performed. Osteophytes were also removed. Reaming starts at 45 mm to  medialize and  Increased in 2 mm increments to 47 mm. We reamed in  approximately 40 degrees of abduction, 20 degrees anteversion. A 48 mm  pinnacle acetabular shell was then impacted in anatomic position under  fluoroscopic guidance with excellent purchase. We did not need to place  any additional dome screws. A 28 mm neutral + 4 marathon liner was then  placed into the acetabular shell.       The femoral lift was then placed along the lateral aspect of the femur  just distal to the vastus ridge. The leg was  externally rotated and capsule  was stripped off the inferior aspect of the femoral neck down to the  level of the lesser trochanter, this was done with electrocautery. The femur was lifted after this was performed. The  leg was then placed in an extended and adducted position essentially delivering the femur. We also removed the capsule superiorly and the piriformis from the piriformis fossa to gain excellent exposure of the  proximal femur. Rongeur was used to  remove some cancellous bone to get  into the lateral portion of the proximal femur for placement of the  initial starter reamer. The starter broaches was placed  the starter broach  and was shown to go down the center of the canal. Broaching  with the Actis system was then performed starting at size 0  coursing  Up to size 5. A size 5 had excellent torsional and rotational  and axial stability. The trial standard offset neck was then placed  with a 28 + 1.5 trial head. The hip was then reduced. We confirmed that   the stem was in the canal both on AP and lateral x-rays. It also has excellent sizing. The hip was reduced with outstanding stability through full extension and full external rotation.. AP pelvis was taken and the leg lengths were measured and found to be equal. Hip was then dislocated again and the femoral head and neck removed. The  femoral broach was removed. Size 5 Actis stem with a standard offset  neck was then impacted into the femur following native anteversion. Has  excellent purchase in the canal. Excellent torsional and rotational and  axial stability. It is confirmed to be in the canal on AP and lateral  fluoroscopic views. The 28 + 1.5 metal head was placed and the hip  reduced with outstanding stability. Again AP pelvis was taken and it  confirmed that the leg lengths were equal. The wound was then copiously  irrigated with saline solution and the capsule reattached and repaired  with Ethibond suture. 30 ml of .25% Bupivicaine was  injected into the capsule and into the edge of the tensor fascia lata as well as subcutaneous tissue. The fascia overlying the tensor fascia lata was then closed with a running #1 V-Loc. Subcu was closed with interrupted 2-0 Vicryl and subcuticular running 4-0 Monocryl. Incision was cleaned  and dried. Steri-Strips and a bulky sterile dressing applied. The patient was awakened and transported to  recovery in stable condition.        Please note that a surgical assistant was a medical necessity for this procedure to perform it in a safe and expeditious manner. Assistant was necessary to provide appropriate retraction of vital neurovascular structures and to prevent femoral fracture and allow for anatomic placement of the prosthesis.  Gaynelle Arabian, M.D.

## 2020-11-01 NOTE — Interval H&P Note (Signed)
History and Physical Interval Note:  11/01/2020 2:35 PM  [name]  has presented today for surgery, with the diagnosis of Right Hip Fx.  The various methods of treatment have been discussed with the patient and family. After consideration of risks, benefits and other options for treatment, the patient has consented to  Procedure(s): TOTAL HIP ARTHROPLASTY ANTERIOR APPROACH (Right) as a surgical intervention.  The patient's history has been reviewed, patient examined, no change in status, stable for surgery.  I have reviewed the patient's chart and labs.  Questions were answered to the patient's satisfaction.     Pilar Plate Kensli Bowley

## 2020-11-01 NOTE — Progress Notes (Addendum)
PROGRESS NOTE  [name] QZE:[id number] DOB: 03-Oct-1922 DOA: 10/29/2020 PCP: [name], MD   LOS: 3 days   Brief Narrative / Interim history: 87 year old female with history of diet-controlled DM2, HTN, HLD, osteoporosis, leukocytosis comes to the hospital after a fall onto the right hip.  She was doing breakfast, opened the dishwasher and lost her balance and fell on the floor.  She could not stand up.  She was brought to the hospital and x-ray was found to have a displaced right femoral neck fracture with varus angulation.  Orthopedic surgery consulted  Subjective / 24h Interval events: Complains of right shoulder pain, has been hurting more this morning  Assessment & Plan: Principal Problem Right femoral neck fracture-orthopedic surgery consulted, appreciate input.  -She is scheduled to go to the OR today but will be transported to [facility] for surgery, discussed with Dr. Wynelle Link, she will remain at [facility] postoperatively -PT eval postop, DVT prophylaxis per surgery -suspect she needs SNF  Active Problems Chronic kidney disease stage IIIa-Baseline creatinine 1.1-1.2, currently at baseline  Leukocytosis-she has a degree of chronic elevation in her WBC, noted as an outpatient.  WBC overall stable, 14 today.  Clinically no signs of an active infection  Right shoulder pain-plain x-ray without any fractures, per radiology difficulty exclude the hemarthrosis, which is possible as she fell on that side.  Defer management to orthopedic surgery  Osteoporosis-vitamin D low, started on high-dose supplementation  Type 2 diabetes mellitus, controlled-sliding scale.  A1c 6.0.  CBGs controlled  CBG (last 3)  Recent Labs    11/01/20 0042 11/01/20 0405 11/01/20 0711  GLUCAP 190* 135* 165*    Essential hypertension-quite hypertensive, likely in the setting of pain.  Hold ACE inhibitor perioperatively, placed on low-dose hydralazine.  Blood pressure acceptable    Scheduled Meds:   chlorhexidine  60 mL Topical Once   enoxaparin (LOVENOX) injection  30 mg Subcutaneous Q24H   feeding supplement (GLUCERNA SHAKE)  237 mL Oral TID BM   hydrALAZINE  10 mg Oral Q8H   insulin aspart  0-6 Units Subcutaneous TID WC   loratadine  5 mg Oral Daily   multivitamin with minerals  1 tablet Oral Daily   polyvinyl alcohol  1-2 drop Both Eyes Daily   povidone-iodine  2 application Topical Once   psyllium  1 packet Oral Daily   Vitamin D (Ergocalciferol)  50,000 Units Oral Q7 days   Continuous Infusions:   ceFAZolin (ANCEF) IV     tranexamic acid     PRN Meds:.HYDROcodone-acetaminophen, morphine injection, ondansetron (ZOFRAN) IV, senna-docusate  Diet Orders (From admission, onward)     Start     Ordered   11/01/20 0001  Diet NPO time specified  Diet effective midnight        10/31/20 0851            DVT prophylaxis: enoxaparin (LOVENOX) injection 30 mg Start: 10/30/20 1530     Code Status: DNR  Family Communication: no family at bedside   Status is: Inpatient  Remains inpatient appropriate because:Inpatient level of care appropriate due to severity of illness  Dispo: The patient is from: Home              Anticipated d/c is to: SNF              Patient currently is not medically stable to d/c.   Difficult to place patient No  Level of care: Med-Surg  Consultants:  Orthopedic surgery   Procedures:  none  Microbiology  none  Antimicrobials: none    Objective: Vitals:   10/31/20 1504 10/31/20 2056 11/01/20 0551 11/01/20 0728  BP: (!) 147/71 (!) 142/62 (!) 156/97 (!) 155/77  Pulse: 97 94 92 94  Resp: 14 16 18 17   Temp: 98.4 F (36.9 C) 98.2 F (36.8 C) 98.9 F (37.2 C) 97.6 F (36.4 C)  TempSrc: Oral Oral Oral Oral  SpO2: 95% 94% 95% 95%  Weight:      Height:        Intake/Output Summary (Last 24 hours) at 11/01/2020 1018 Last data filed at 11/01/2020 0900 Gross per 24 hour  Intake 0 ml  Output 1100 ml  Net -1100 ml    Filed Weights    10/29/20 1029  Weight: 57.6 kg    Examination:  Constitutional: No distress Eyes: No scleral icterus ENMT: mmm Neck: normal, supple Respiratory: Clear bilaterally, no wheezing, no crackles Cardiovascular: Regular rate and rhythm, no murmurs, no edema Abdomen: Soft, nontender, nondistended, bowel sounds positive Musculoskeletal: no clubbing / cyanosis.  Skin: No rashes seen Neurologic: No focal deficits  Data Reviewed: I have independently reviewed following labs and imaging studies  CBC: Recent Labs  Lab 10/27/20 1159 10/29/20 1037 10/30/20 0114 10/31/20 0332 11/01/20 0137  WBC 11.2* 14.5* 13.2* 15.5* 14.0*  NEUTROABS 8.5* 12.3*  --   --   --   HGB 12.6 12.9 11.7* 13.3 11.6*  HCT 38.0 41.7 34.7* 40.2 34.3*  MCV 94.8 100.5* 94.6 95.9 93.7  PLT 301.0 296 240 254 403    Basic Metabolic Panel: Recent Labs  Lab 10/29/20 1037 10/30/20 0114 10/31/20 0332 11/01/20 0137  NA 136 134* 133* 132*  K 4.7 4.2 3.9 3.8  CL 106 103 96* 99  CO2 21* 27 28 25   GLUCOSE 101* 117* 141* 153*  BUN 30* 21 18 19   CREATININE 1.11* 1.06* 0.99 0.90  CALCIUM 9.4 9.1 9.7 8.8*    Liver Function Tests: Recent Labs  Lab 10/29/20 1037  AST 28  ALT 24  ALKPHOS 55  BILITOT 0.9  PROT 6.7  ALBUMIN 3.6    Coagulation Profile: Recent Labs  Lab 10/29/20 1037  INR 1.0    HbA1C: Recent Labs    10/29/20 1749  HGBA1C 6.0*    CBG: Recent Labs  Lab 10/31/20 1155 10/31/20 1626 11/01/20 0042 11/01/20 0405 11/01/20 0711  GLUCAP 149* 165* 190* 135* 165*     Recent Results (from the past 240 hour(s))  Resp Panel by RT-PCR (Flu A&B, Covid) Nasopharyngeal Swab     Status: None   Collection Time: 10/29/20 11:23 AM   Specimen: Nasopharyngeal Swab; Nasopharyngeal(NP) swabs in vial transport medium  Result Value Ref Range Status   SARS Coronavirus 2 by RT PCR NEGATIVE NEGATIVE Final    Comment: (NOTE) SARS-CoV-2 target nucleic acids are NOT DETECTED.  The SARS-CoV-2 RNA is  generally detectable in upper respiratory specimens during the acute phase of infection. The lowest concentration of SARS-CoV-2 viral copies this assay can detect is 138 copies/mL. A negative result does not preclude SARS-Cov-2 infection and should not be used as the sole basis for treatment or other patient management decisions. A negative result may occur with  improper specimen collection/handling, submission of specimen other than nasopharyngeal swab, presence of viral mutation(s) within the areas targeted by this assay, and inadequate number of viral copies(<138 copies/mL). A negative result must be combined with clinical observations, patient history, and epidemiological information. The expected result is Negative.  Fact Sheet  for Patients:  EntrepreneurPulse.com.au  Fact Sheet for Healthcare Providers:  IncredibleEmployment.be  This test is no t yet approved or cleared by the [place] FDA and  has been authorized for detection and/or diagnosis of SARS-CoV-2 by FDA under an Emergency Use Authorization (EUA). This EUA will remain  in effect (meaning this test can be used) for the duration of the COVID-19 declaration under Section 564(b)(1) of the Act, 21 U.S.C.section 360bbb-3(b)(1), unless the authorization is terminated  or revoked sooner.       Influenza A by PCR NEGATIVE NEGATIVE Final   Influenza B by PCR NEGATIVE NEGATIVE Final    Comment: (NOTE) The Xpert Xpress SARS-CoV-2/FLU/RSV plus assay is intended as an aid in the diagnosis of influenza from Nasopharyngeal swab specimens and should not be used as a sole basis for treatment. Nasal washings and aspirates are unacceptable for Xpert Xpress SARS-CoV-2/FLU/RSV testing.  Fact Sheet for Patients: EntrepreneurPulse.com.au  Fact Sheet for Healthcare Providers: IncredibleEmployment.be  This test is not yet approved or cleared by the [place] FDA and has been authorized for detection and/or diagnosis of SARS-CoV-2 by FDA under an Emergency Use Authorization (EUA). This EUA will remain in effect (meaning this test can be used) for the duration of the COVID-19 declaration under Section 564(b)(1) of the Act, 21 U.S.C. section 360bbb-3(b)(1), unless the authorization is terminated or revoked.  Performed at [facility], [place] [address] [place], [place] [address]   Surgical pcr screen     Status: None   Collection Time: 10/31/20  9:45 PM   Specimen: Nasal Mucosa; Nasal Swab  Result Value Ref Range Status   MRSA, PCR NEGATIVE NEGATIVE Final   Staphylococcus aureus NEGATIVE NEGATIVE Final    Comment: (NOTE) The Xpert SA Assay (FDA approved for NASAL specimens in patients 25 years of age and older), is one component of a comprehensive surveillance program. It is not intended to diagnose infection nor to guide or monitor treatment. Performed at [facility], [place] [address] [place], [place] [address]       Radiology Studies: DG Shoulder Right Port  Result Date: 11/01/2020 CLINICAL DATA:  Right shoulder pain with limited mobility after a fall. EXAM: PORTABLE RIGHT SHOULDER COMPARISON:  03/28/2016. FINDINGS: No fracture or dislocation. Advanced degenerative changes in the right glenohumeral and acromioclavicular joints. There may be slight increase in the acromial humeral interval when compared with 03/28/2016. Visualized right chest is unremarkable. Visualized right ribs appear intact. IMPRESSION: 1. No fracture or dislocation. Difficult to exclude a hemarthrosis in the right shoulder. 2. 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 Electronically Signed   By: Lorin Picket M.D.   On: 11/01/2020 08:26    Marzetta Board, MD, PhD [facility]  Between 7 am - 7 pm I am available, please contact me via Amion (for emergencies) or Securechat (non urgent messages)  Between 7 pm - 7 am I  am not available, please contact night coverage MD/APP via Amion

## 2020-11-01 NOTE — Plan of Care (Signed)
Plan of care reviewed and discussed with the patient's daughters.

## 2020-11-01 NOTE — Progress Notes (Signed)
I have seen and examined [name] and she had a mechanical fall in her kitchen a few days ago with a displaced right femoral neck fracture. She has underlying hip OA but did not complain of hip pain prior to her fall. She also has left forearm pain but had negative x-rays Her RLE is shortened and externally rotated with intact motor and sensation in her foot. She presents today for right anterior THA. I discussed this in detail with the patient and her family member and they elect to proceed

## 2020-11-01 NOTE — Anesthesia Postprocedure Evaluation (Signed)
Anesthesia Post Note  Patient: [name]  Procedure(s) Performed: TOTAL HIP ARTHROPLASTY ANTERIOR APPROACH (Right: Hip)     Patient location during evaluation: PACU Anesthesia Type: General Level of consciousness: patient cooperative, sedated and oriented Pain management: pain level controlled Vital Signs Assessment: post-procedure vital signs reviewed and stable Respiratory status: spontaneous breathing, nonlabored ventilation and respiratory function stable Cardiovascular status: blood pressure returned to baseline and stable Postop Assessment: no apparent nausea or vomiting Anesthetic complications: no Comments: Called to PACU to eval pt's irregular HR. Pt in NSR with frequent PAC's. VSS. This rhythm is similar to that seen on prior EKGs. Stable   No notable events documented.  Last Vitals:  Vitals:   11/01/20 1733 11/01/20 1740  BP: (!) 158/76   Pulse:  94  Resp:    Temp:    SpO2:  99%    Last Pain:  Vitals:   11/01/20 1740  TempSrc:   PainSc: 0-No pain                 Malissia Rabbani,E. Sreya Froio

## 2020-11-01 NOTE — Plan of Care (Signed)

## 2020-11-01 NOTE — Transfer of Care (Signed)
Immediate Anesthesia Transfer of Care Note  Patient: [name]  Procedure(s) Performed: TOTAL HIP ARTHROPLASTY ANTERIOR APPROACH (Right: Hip)  Patient Location: PACU  Anesthesia Type:General  Level of Consciousness: awake, drowsy and patient cooperative  Airway & Oxygen Therapy: Patient Spontanous Breathing and Patient connected to face mask oxygen  Post-op Assessment: Report given to RN and Post -op Vital signs reviewed and stable  Post vital signs: Reviewed and stable  Last Vitals:  Vitals Value Taken Time  BP 172/89 11/01/20 1641  Temp    Pulse 72 11/01/20 1644  Resp 19 11/01/20 1644  SpO2 99 % 11/01/20 1644  Vitals shown include unvalidated device data.  Last Pain:  Vitals:   11/01/20 1331  TempSrc:   PainSc: 4       Patients Stated Pain Goal: 3 (64/68/03 2122)  Complications: No notable events documented.

## 2020-11-01 NOTE — Discharge Instructions (Addendum)
[name] Arabian, MD Total Joint Specialist [facility] [address], [address] [place], [place] [address] [phone]  ANTERIOR APPROACH TOTAL HIP REPLACEMENT POSTOPERATIVE DIRECTIONS     Hip Rehabilitation, Guidelines Following Surgery  The results of a hip operation are greatly improved after range of motion and muscle strengthening exercises. Follow all safety measures which are given to protect your hip. If any of these exercises cause increased pain or swelling in your joint, decrease the amount until you are comfortable again. Then slowly increase the exercises. Call your caregiver if you have problems or questions.   BLOOD CLOT PREVENTION Take a 325 mg Aspirin two times a day for three weeks following surgery. Then resume one 81 mg aspirin once a day. You may resume your vitamins/supplements upon discharge from the hospital. Do not take any NSAIDs (Advil, Aleve, Ibuprofen, Meloxicam, etc.) until you have discontinued the 325 mg Aspirin.  HOME CARE INSTRUCTIONS  Remove items at home which could result in a fall. This includes throw rugs or furniture in walking pathways.  ICE to the affected hip as frequently as 20-30 minutes an hour and then as needed for pain and swelling. Continue to use ice on the hip for pain and swelling from surgery. You may notice swelling that will progress down to the foot and ankle. This is normal after surgery. Elevate the leg when you are not up walking on it.   Continue to use the breathing machine which will help keep your temperature down.  It is common for your temperature to cycle up and down following surgery, especially at night when you are not up moving around and exerting yourself.  The breathing machine keeps your lungs expanded and your temperature down.  DIET You may resume your previous home diet once your are discharged from the hospital.  DRESSING / WOUND CARE / SHOWERING You have an adhesive waterproof bandage over the  incision. Leave this in place until your first follow-up appointment. Once you remove this you will not need to place another bandage.  You may begin showering 3 days following surgery, but do not submerge the incision under water.  ACTIVITY For the first 3-5 days, it is important to rest and keep the operative leg elevated. You should, as a general rule, rest for 50 minutes and walk/stretch for 10 minutes per hour. After 5 days, you may slowly increase activity as tolerated.  Perform the exercises you were provided twice a day for about 15-20 minutes each session. Begin these 2 days following surgery. Walk with your walker as instructed. Use the walker until you are comfortable transitioning to a cane. Walk with the cane in the opposite hand of the operative leg. You may discontinue the cane once you are comfortable and walking steadily. Avoid periods of inactivity such as sitting longer than an hour when not asleep. This helps prevent blood clots.  Do not drive a car for 6 weeks or until released by your surgeon.  Do not drive while taking narcotics.  TED HOSE STOCKINGS Wear the elastic stockings on both legs for three weeks following surgery during the day. You may remove them at night while sleeping.  WEIGHT BEARING Weight bearing as tolerated with assist device (walker, cane, etc) as directed, use it as long as suggested by your surgeon or therapist, typically at least 4-6 weeks.  POSTOPERATIVE CONSTIPATION PROTOCOL Constipation - defined medically as fewer than three stools per week and severe constipation as less than one stool per week.  less than one stool per week.  One of the most common issues patients have following surgery is constipation.  Even if you have a regular bowel pattern at home, your normal regimen is likely to be disrupted due to multiple reasons following surgery.  Combination of anesthesia, postoperative narcotics, change in appetite and fluid intake all can  affect your bowels.  In order to avoid complications following surgery, here are some recommendations in order to help you during your recovery period.  Colace (docusate) - Pick up an over-the-counter form of Colace or another stool softener and take twice a day as long as you are requiring postoperative pain medications.  Take with a full glass of water daily.  If you experience loose stools or diarrhea, hold the colace until you stool forms back up.  If your symptoms do not get better within 1 week or if they get worse, check with your doctor. Dulcolax (bisacodyl) - Pick up over-the-counter and take as directed by the product packaging as needed to assist with the movement of your bowels.  Take with a full glass of water.  Use this product as needed if not relieved by Colace only.  MiraLax (polyethylene glycol) - Pick up over-the-counter to have on hand.  MiraLax is a solution that will increase the amount of water in your bowels to assist with bowel movements.  Take as directed and can mix with a glass of water, juice, soda, coffee, or tea.  Take if you go more than two days without a movement.Do not use MiraLax more than once per day. Call your doctor if you are still constipated or irregular after using this medication for 7 days in a row.  If you continue to have problems with postoperative constipation, please contact the office for further assistance and recommendations.  If you experience "the worst abdominal pain ever" or develop nausea or vomiting, please contact the office immediatly for further recommendations for treatment.  ITCHING  If you experience itching with your medications, try taking only a single pain pill, or even half a pain pill at a time.  You can also use Benadryl over the counter for itching or also to help with sleep.   MEDICATIONS See your medication summary on the "After Visit Summary" that the nursing staff will review with you prior to discharge.  You may have some home  medications which will be placed on hold until you complete the course of blood thinner medication.  It is important for you to complete the blood thinner medication as prescribed by your surgeon.  Continue your approved medications as instructed at time of discharge.  PRECAUTIONS If you experience chest pain or shortness of breath - call 911 immediately for transfer to the hospital emergency department.  If you develop a fever greater that 101 F, purulent drainage from wound, increased redness or drainage from wound, foul odor from the wound/dressing, or calf pain - CONTACT YOUR SURGEON.                                                   FOLLOW-UP APPOINTMENTS Make sure you keep all of your appointments after your operation with your surgeon and caregivers. You should call the office at the above phone number and make an appointment for approximately two weeks after the date of your surgery or on the   date instructed by your surgeon outlined in the "After Visit Summary".  RANGE OF MOTION AND STRENGTHENING EXERCISES  These exercises are designed to help you keep full movement of your hip joint. Follow your caregiver's or physical therapist's instructions. Perform all exercises about fifteen times, three times per day or as directed. Exercise both hips, even if you have had only one joint replacement. These exercises can be done on a training (exercise) mat, on the floor, on a table or on a bed. Use whatever works the best and is most comfortable for you. Use music or television while you are exercising so that the exercises are a pleasant break in your day. This will make your life better with the exercises acting as a break in routine you can look forward to.  Lying on your back, slowly slide your foot toward your buttocks, raising your knee up off the floor. Then slowly slide your foot back down until your leg is straight again.  Lying on your back spread your legs as far apart as you can without causing  discomfort.  Lying on your side, raise your upper leg and foot straight up from the floor as far as is comfortable. Slowly lower the leg and repeat.  Lying on your back, tighten up the muscle in the front of your thigh (quadriceps muscles). You can do this by keeping your leg straight and trying to raise your heel off the floor. This helps strengthen the largest muscle supporting your knee.  Lying on your back, tighten up the muscles of your buttocks both with the legs straight and with the knee bent at a comfortable angle while keeping your heel on the floor.   POST-OPERATIVE OPIOID TAPER INSTRUCTIONS: It is important to wean off of your opioid medication as soon as possible. If you do not need pain medication after your surgery it is ok to stop day one. Opioids include: Codeine, Hydrocodone(Norco, Vicodin), Oxycodone(Percocet, oxycontin) and hydromorphone amongst others.  Long term and even short term use of opiods can cause: Increased pain response Dependence Constipation Depression Respiratory depression And more.  Withdrawal symptoms can include Flu like symptoms Nausea, vomiting And more Techniques to manage these symptoms Hydrate well Eat regular healthy meals Stay active Use relaxation techniques(deep breathing, meditating, yoga) Do Not substitute Alcohol to help with tapering If you have been on opioids for less than two weeks and do not have pain than it is ok to stop all together.  Plan to wean off of opioids This plan should start within one week post op of your joint replacement. Maintain the same interval or time between taking each dose and first decrease the dose.  Cut the total daily intake of opioids by one tablet each day Next start to increase the time between doses. The last dose that should be eliminated is the evening dose.   IF YOU ARE TRANSFERRED TO A SKILLED REHAB FACILITY If the patient is transferred to a skilled rehab facility following release from the  hospital, a list of the current medications will be sent to the facility for the patient to continue.  When discharged from the skilled rehab facility, please have the facility set up the patient's Home Health Physical Therapy prior to being released. Also, the skilled facility will be responsible for providing the patient with their medications at time of release from the facility to include their pain medication, the muscle relaxants, and their blood thinner medication. If the patient is still at the rehab facility   up appointment, the skilled rehab facility will also need to assist the patient in arranging follow up appointment in our office and any transportation needs.  MAKE SURE YOU:  Understand these instructions.  Get help right away if you are not doing well or get worse.    DENTAL ANTIBIOTICS:  In most cases prophylactic antibiotics for Dental procdeures after total joint surgery are not necessary.  Exceptions are as follows:  1. History of prior total joint infection  2. Severely immunocompromised (Organ Transplant, cancer chemotherapy, Rheumatoid biologic meds such as Humera)  3. Poorly controlled diabetes (A1C &gt; 8.0, blood glucose over 200)  If you have one of these conditions, contact your surgeon for an antibiotic prescription, prior to your dental procedure.    Pick up stool softner and laxative for home use following surgery while on pain medications. Do not submerge incision under water. Please use good hand washing techniques while changing dressing each day. May shower starting three days after surgery. Please use a clean towel to pat the incision dry following showers. Continue to use ice for pain and swelling after surgery. Do not use any lotions or creams on the incision until instructed by your surgeon.  

## 2020-11-01 NOTE — Anesthesia Procedure Notes (Signed)
Procedure Name: LMA Insertion Date/Time: 11/01/2020 3:07 PM Performed by: Raenette Rover, CRNA Pre-anesthesia Checklist: Patient identified, Emergency Drugs available, Suction available and Patient being monitored Patient Re-evaluated:Patient Re-evaluated prior to induction Oxygen Delivery Method: Circle system utilized Preoxygenation: Pre-oxygenation with 100% oxygen Induction Type: IV induction LMA: LMA inserted LMA Size: 4.0 Number of attempts: 1 Placement Confirmation: positive ETCO2 and breath sounds checked- equal and bilateral Tube secured with: Tape Dental Injury: Teeth and Oropharynx as per pre-operative assessment

## 2020-11-01 NOTE — Plan of Care (Signed)
  Problem: Education: Goal: Knowledge of General Education information will improve Description Including pain rating scale, medication(s)/side effects and non-pharmacologic comfort measures Outcome: Progressing   Problem: Health Behavior/Discharge Planning: Goal: Ability to manage health-related needs will improve Outcome: Progressing   

## 2020-11-01 NOTE — Progress Notes (Addendum)
RN received call from OR this AM about pt needing to transfer to [facility] for surgery this afternoon. Pt made aware of plans and agreeable. RN attempted to call daughter, [name], per pt request, voicemail left with call back number.  1019: Attempted to call other daughter, [name], with no answer.  1100: Both daughters now in room and updated on plan of care.

## 2020-11-02 ENCOUNTER — Encounter (HOSPITAL_COMMUNITY): Payer: Self-pay | Admitting: Orthopedic Surgery

## 2020-11-02 DIAGNOSIS — S72001A Fracture of unspecified part of neck of right femur, initial encounter for closed fracture: Secondary | ICD-10-CM

## 2020-11-02 LAB — BASIC METABOLIC PANEL
Anion gap: 8 (ref 5–15)
BUN: 19 mg/dL (ref 8–23)
CO2: 24 mmol/L (ref 22–32)
Calcium: 8.5 mg/dL — ABNORMAL LOW (ref 8.9–10.3)
Chloride: 102 mmol/L (ref 98–111)
Creatinine, Ser: 0.82 mg/dL (ref 0.44–1.00)
GFR, Estimated: >60 mL/min (ref >60)
Glucose, Bld: 132 mg/dL — ABNORMAL HIGH (ref 70–99)
Potassium: 4.4 mmol/L (ref 3.5–5.1)
Sodium: 134 mmol/L — ABNORMAL LOW (ref 135–145)

## 2020-11-02 LAB — GLUCOSE, CAPILLARY
Glucose-Capillary: 124 mg/dL — ABNORMAL HIGH (ref 70–99)
Glucose-Capillary: 203 mg/dL — ABNORMAL HIGH (ref 70–99)
Glucose-Capillary: 201 mg/dL — ABNORMAL HIGH (ref 70–99)
Glucose-Capillary: 186 mg/dL — ABNORMAL HIGH (ref 70–99)

## 2020-11-02 LAB — CBC
HCT: 34.9 % — ABNORMAL LOW (ref 36.0–46.0)
Hemoglobin: 11.2 g/dL — ABNORMAL LOW (ref 12.0–15.0)
MCH: 30.9 pg (ref 26.0–34.0)
MCHC: 32.1 g/dL (ref 30.0–36.0)
MCV: 96.4 fL (ref 80.0–100.0)
Platelets: 205 10*3/uL (ref 150–400)
RBC: 3.62 MIL/uL — ABNORMAL LOW (ref 3.87–5.11)
RDW: 13.9 % (ref 11.5–15.5)
WBC: 17.4 10*3/uL — ABNORMAL HIGH (ref 4.0–10.5)
nRBC: 0 % (ref 0.0–0.2)

## 2020-11-02 MED ORDER — LORATADINE 10 MG PO TABS
10.0000 mg | ORAL_TABLET | Freq: Every day | ORAL | Status: DC
  Administered 2020-11-02 – 2020-11-03 (×2): 10 mg via ORAL
  Filled 2020-11-02 (×2): qty 1

## 2020-11-02 NOTE — NC FL2 (Signed)
[place] LEVEL OF CARE SCREENING TOOL     IDENTIFICATION  Patient Name: [name] Birthdate: 1922/12/18 Sex: female Admission Date (Current Location): 10/29/2020  [facility] and [place] Number:  Herbalist and Address:  [facility],  [address], [place]      Provider Number: [id number]  Attending Physician Name and Address:  Darliss Cheney, MD  Relative Name and Phone Number:  [name] (daughter) Ph: [phone]    Current Level of Care: Hospital Recommended Level of Care: [place] Prior Approval Number:    Date Approved/Denied:   PASRR Number: [id number]  Discharge Plan: SNF    Current Diagnoses: Patient Active Problem List   Diagnosis Date Noted   Hip fracture ([place]) 11/01/2020   Closed displaced fracture of right femoral neck ([place]) 10/29/2020   Discharge of eye, left 10/29/2020   Elevated serum creatinine 10/29/2020   Sprain of anterior talofibular ligament of left ankle 06/22/2020   Acute cystitis without hematuria 05/10/2020   History of colonic polyps 04/28/2020   Urinary frequency 12/16/2019   Cough, persistent 11/06/2019   Acute neck pain 09/26/2019   Seborrheic dermatitis of scalp 09/26/2019   Nocturia 08/22/2019   Leukocytosis 06/13/2018   Seborrheic keratoses 10/16/2017   Hyponatremia 09/14/2017   Chronic left upper quadrant pain 08/07/2017   Right hip pain 07/12/2017   Hearing loss 02/20/2017   TIA (transient ischemic attack), possible 11/14/2016   Left carpal tunnel syndrome 08/11/2016   Fatigue 06/15/2016   Constipation, chronic 03/21/2016   Near syncope 05/07/2015   Osteoarthritis of right shoulder 05/04/2015   Counseling regarding end of life decision making 01/15/2015   Skin lesion of right leg 07/10/2014   Insomnia 04/10/2014   Shakiness 12/16/2013   Osteoarthritis of right knee 10/14/2013   Osteoarthritis of left knee 10/14/2013   Infected sebaceous cyst  11/29/2012   Gout, chronic, without tophus 12/13/2009   Osteoporosis 10/29/2009   PALPITATIONS, OCCASIONAL 07/07/2008   Allergic rhinitis 06/02/2008   HEMANGIOMA 07/30/2006   Controlled type 2 diabetes mellitus without complication, without long-term current use of insulin ([place]) 07/30/2006   Hyperlipidemia LDL goal <100 07/30/2006   Essential hypertension, benign 07/30/2006   RECTAL POLYPS 07/30/2006   Osteoarthritis 07/30/2006   CATARACT, HX OF 07/30/2006    Orientation RESPIRATION BLADDER Height & Weight     Self, Time, Situation, Place  Normal Incontinent Weight: 126 lb 15.8 oz (57.6 kg) Height:  5\' 3"  (160 cm)  BEHAVIORAL SYMPTOMS/MOOD NEUROLOGICAL BOWEL NUTRITION STATUS      Continent Diet (Regular diet)  AMBULATORY STATUS COMMUNICATION OF NEEDS Skin   Limited Assist Verbally Other (Comment) (Ecchymosis: bilateral arms; Blister: perineum)                       Personal Care Assistance Level of Assistance  Bathing, Feeding, Dressing Bathing Assistance: Limited assistance Feeding assistance: Independent Dressing Assistance: Limited assistance     Functional Limitations Info  Sight, Hearing, Speech Sight Info: Impaired Hearing Info: Impaired Speech Info: Adequate    SPECIAL CARE FACTORS FREQUENCY  PT (By licensed PT), OT (By licensed OT)     PT Frequency: 5x's/week OT Frequency: 5x's/week            Contractures Contractures Info: Not present    Additional Factors Info  Code Status, Allergies, Insulin Sliding Scale Code Status Info: DNR Allergies Info: Codeine, Statins   Insulin Sliding Scale Info: See discharge summary  Current Medications (11/02/2020):  This is the current hospital active medication list Current Facility-Administered Medications  Medication Dose Route Frequency Provider Last Rate Last Admin   0.9 %  sodium chloride infusion   Intravenous Continuous Edmisten, Kristie L, PA 30 mL/hr at 11/02/20 0843 Infusion Verify at 11/02/20  0843   acetaminophen (TYLENOL) tablet 325-650 mg  325-650 mg Oral Q6H PRN Edmisten, Kristie L, PA   650 mg at 11/02/20 1020   aspirin EC tablet 81 mg  81 mg Oral BID Edmisten, Kristie L, PA   81 mg at 11/02/20 0857   bisacodyl (DULCOLAX) suppository 10 mg  10 mg Rectal Daily PRN Edmisten, Kristie L, PA       docusate sodium (COLACE) capsule 100 mg  100 mg Oral BID Edmisten, Kristie L, PA   100 mg at 11/02/20 0857   feeding supplement (GLUCERNA SHAKE) (GLUCERNA SHAKE) liquid 237 mL  237 mL Oral TID BM Edmisten, Kristie L, PA   237 mL at 11/02/20 0856   hydrALAZINE (APRESOLINE) tablet 10 mg  10 mg Oral Q8H Edmisten, Kristie L, PA   10 mg at 11/02/20 1522   HYDROcodone-acetaminophen (NORCO/VICODIN) 5-325 MG per tablet 1-2 tablet  1-2 tablet Oral Q4H PRN Edmisten, Kristie L, PA       insulin aspart (novoLOG) injection 0-6 Units  0-6 Units Subcutaneous TID WC Edmisten, Kristie L, PA   2 Units at 11/02/20 1314   loratadine (CLARITIN) tablet 10 mg  10 mg Oral Daily Pahwani, Einar Grad, MD       magnesium citrate solution 1 Bottle  1 Bottle Oral Once PRN Edmisten, Kristie L, PA       menthol-cetylpyridinium (CEPACOL) lozenge 3 mg  1 lozenge Oral PRN Edmisten, Kristie L, PA       Or   phenol (CHLORASEPTIC) mouth spray 1 spray  1 spray Mouth/Throat PRN Edmisten, Kristie L, PA       methocarbamol (ROBAXIN) tablet 500 mg  500 mg Oral Q6H PRN Edmisten, Kristie L, PA       Or   methocarbamol (ROBAXIN) 500 mg in dextrose 5 % 50 mL IVPB  500 mg Intravenous Q6H PRN Edmisten, Kristie L, PA       metoCLOPramide (REGLAN) tablet 5-10 mg  5-10 mg Oral Q8H PRN Edmisten, Kristie L, PA       Or   metoCLOPramide (REGLAN) injection 5-10 mg  5-10 mg Intravenous Q8H PRN Edmisten, Kristie L, PA       morphine 4 MG/ML injection 0.52-1 mg  0.52-1 mg Intravenous Q2H PRN Edmisten, Kristie L, PA       ondansetron (ZOFRAN) tablet 4 mg  4 mg Oral Q6H PRN Edmisten, Kristie L, PA       Or   ondansetron (ZOFRAN) injection 4 mg  4 mg  Intravenous Q6H PRN Edmisten, Kristie L, PA       polyethylene glycol (MIRALAX / GLYCOLAX) packet 17 g  17 g Oral Daily PRN Edmisten, Kristie L, PA       polyvinyl alcohol (LIQUIFILM TEARS) 1.4 % ophthalmic solution 1-2 drop  1-2 drop Both Eyes Daily Edmisten, Kristie L, PA   1 drop at 11/02/20 1058   psyllium (HYDROCIL/METAMUCIL) 1 packet  1 packet Oral Daily Edmisten, Kristie L, PA   1 packet at 11/02/20 0855   senna-docusate (Senokot-S) tablet 1 tablet  1 tablet Oral QHS PRN Edmisten, Kristie L, PA   1 tablet at 10/30/20 2132   traMADol (ULTRAM) tablet 50-100 mg  50-100  mg Oral Q6H PRN Derl Barrow, PA         Discharge Medications: Please see discharge summary for a list of discharge medications.  Relevant Imaging Results:  Relevant Lab Results:   Additional Information SSN: [id number]  Sherie Don, LCSW

## 2020-11-02 NOTE — Plan of Care (Signed)
  Problem: Health Behavior/Discharge Planning: Goal: Ability to manage health-related needs will improve Outcome: Progressing   Problem: Clinical Measurements: Goal: Ability to maintain clinical measurements within normal limits will improve 11/02/2020 0836 by Williams Che, RN Outcome: Progressing 11/01/2020 1929 by Williams Che, RN Outcome: Progressing   Problem: Activity: Goal: Risk for activity intolerance will decrease 11/02/2020 0836 by Williams Che, RN Outcome: Progressing 11/01/2020 1929 by Williams Che, RN Outcome: Progressing   Problem: Nutrition: Goal: Adequate nutrition will be maintained 11/02/2020 0836 by Williams Che, RN Outcome: Progressing 11/01/2020 1929 by Williams Che, RN Outcome: Progressing   Problem: Coping: Goal: Level of anxiety will decrease 11/02/2020 0836 by Williams Che, RN Outcome: Progressing 11/01/2020 1929 by Williams Che, RN Outcome: Progressing   Problem: Elimination: Goal: Will not experience complications related to bowel motility 11/02/2020 0836 by Williams Che, RN Outcome: Progressing 11/01/2020 1929 by Williams Che, RN Outcome: Progressing   Problem: Pain Managment: Goal: General experience of comfort will improve Outcome: Progressing   Problem: Safety: Goal: Ability to remain free from injury will improve 11/02/2020 0836 by Williams Che, RN Outcome: Progressing 11/01/2020 1929 by Williams Che, RN Outcome: Progressing   Problem: Skin Integrity: Goal: Risk for impaired skin integrity will decrease 11/02/2020 0836 by Williams Che, RN Outcome: Progressing 11/01/2020 1929 by Williams Che, RN Outcome: Progressing

## 2020-11-02 NOTE — Progress Notes (Signed)
  11/02/2020 1 Day Post-Op Procedure(s) (LRB): TOTAL HIP ARTHROPLASTY ANTERIOR APPROACH (Right) Patient reports pain as mild.   Patient seen in rounds by Dr. Wynelle Link. Patient is well, and has had no acute complaints or problems They will be WBAT to the right. Plan is to go Skilled nursing facility after hospital stay.  Vital signs in last 24 hours: Temp:  [97.7 F (36.5 C)-101 F (38.3 C)] 98.9 F (37.2 C) (07/12 1006) Pulse Rate:  [66-111] 91 (07/12 1006) Resp:  [16-23] 16 (07/12 1006) BP: (122-177)/(67-98) 122/67 (07/12 1006) SpO2:  [95 %-100 %] 95 % (07/12 1006)  I&O's: I/O last 3 completed shifts: In: 1990.4 [P.O.:60; I.V.:1430.4; IV Piggyback:500] Out: 1600 [Urine:1450; Blood:150] Total I/O In: 104.9 [I.V.:104.9] Out: -   Labs: Recent Labs    10/31/20 0332 11/01/20 0137 11/02/20 0324  HGB 13.3 11.6* 11.2*   Recent Labs    11/01/20 0137 11/02/20 0324  WBC 14.0* 17.4*  RBC 3.66* 3.62*  HCT 34.3* 34.9*  PLT 220 205   Recent Labs    11/01/20 0137 11/02/20 0324  NA 132* 134*  K 3.8 4.4  CL 99 102  CO2 25 24  BUN 19 19  CREATININE 0.90 0.82  GLUCOSE 153* 132*  CALCIUM 8.8* 8.5*   No results for input(s): LABPT, INR in the last 72 hours.  Exam: General - Patient is Alert and Oriented Extremity - Neurologically intact Neurovascular intact Intact pulses distally Dorsiflexion/Plantar flexion intact Dressing - dressing C/D/I Motor Function - intact, moving foot and toes well on exam.   Past Medical History:  Diagnosis Date   Acute pharyngitis    Acute sinusitis, unspecified    Acute upper respiratory infections of unspecified site    Allergy    Anal and rectal polyp    Cataract    History of   Cavernous angioma    Degenerative cervical disc 02/2006   Diabetes mellitus    Type II   GERD (gastroesophageal reflux disease)    Gout    Hemangioma of unspecified site    Hyperlipidemia    Hypertension    Orthostatic hypotension 04/2006    Hospital   Osteoarthritis    Osteopenia    Other abscess of vulva    Palpitations    Syncope and collapse    1 episode    Assessment/Plan: 1 Day Post-Op Procedure(s) (LRB): TOTAL HIP ARTHROPLASTY ANTERIOR APPROACH (Right) Principal Problem:   Closed displaced fracture of right femoral neck (HCC) Active Problems:   Controlled type 2 diabetes mellitus without complication, without long-term current use of insulin (HCC)   Hyperlipidemia LDL goal <100   Essential hypertension, benign   Osteoporosis   Leukocytosis   Nocturia   Discharge of eye, left   Elevated serum creatinine   Hip fracture (HCC)  Estimated body mass index is 22.49 kg/m as calculated from the following:   Height as of this encounter: 5\' 3"  (1.6 m).   Weight as of this encounter: 57.6 kg. Up with therapy  DVT Prophylaxis - Aspirin Weight-bearing as tolerated  Plan for two sessions with PT today, and if meeting goals, will plan for discharge once bed is available with SNF.   Patient to follow up in two weeks with Dr. Wynelle Link in clinic.   The PDMP database was reviewed today prior to any opioid medications being prescribed to this patient.Fenton Foy, MBA, PA-C Orthopedic Surgery 11/02/2020, 10:51 AM

## 2020-11-02 NOTE — TOC Initial Note (Signed)
Transition of Care [facility] - Initial/Assessment Note   Patient Details  Name: [name] MRN: [id number] Date of Birth: 11/17/1922  [facility] of [facility] CM/SW Contact:    Sherie Don, LCSW Phone Number: 11/02/2020, 4:24 PM  Clinical Narrative: PT evaluation recommended SNF. CSW spoke with patient's daughter, [name], to confirm family is agreeable to rehab. Patient is vaccinated and boosted x1 for COVID. FL2 done; PASRR received. Initial referral faxed out. TOC awaiting bed offers.  Expected Discharge Plan: Skilled Nursing Facility Barriers to Discharge: SNF Pending bed offer, Continued Medical Work up  Patient Goals and CMS Choice Patient states their goals for this hospitalization and ongoing recovery are:: Discharge to rehab CMS Medicare.gov Compare Post Acute Care list provided to:: Patient Represenative (must comment) Choice offered to / list presented to : Adult Children  Expected Discharge Plan and Services Expected Discharge Plan: [place] In-house Referral: Clinical Social Work Post Acute Care Choice: [place] Living arrangements for the past 2 months: [place]               DME Arranged: N/A DME Agency: NA  Prior Living Arrangements/Services Living arrangements for the past 2 months: Single Family Home Patient language and need for interpreter reviewed:: Yes Do you feel safe going back to the place where you live?: Yes      Need for Family Participation in Patient Care: Yes (Comment) Care giver support system in place?: Yes (comment) Criminal Activity/Legal Involvement Pertinent to Current Situation/Hospitalization: No - Comment as needed  Activities of Daily Living Home Assistive Devices/Equipment: Walker (specify type), Shower chair with back, Grab bars in shower, Grab bars around toilet, Bedside commode/3-in-1 (Toilet chair with arms) ADL Screening (condition at time of admission) Patient's cognitive  ability adequate to safely complete daily activities?: Yes Is the patient deaf or have difficulty hearing?: Yes (Hearing aides. They are not available.) Does the patient have difficulty seeing, even when wearing glasses/contacts?: No Does the patient have difficulty concentrating, remembering, or making decisions?: No Patient able to express need for assistance with ADLs?: Yes Does the patient have difficulty dressing or bathing?: Yes Independently performs ADLs?: Yes (appropriate for developmental age) Does the patient have difficulty walking or climbing stairs?: No Weakness of Legs: Both Weakness of Arms/Hands: None  Permission Sought/Granted Permission sought to share information with : Facility Art therapist granted to share information with : Yes, Verbal Permission Granted Permission granted to share info w AGENCY: SNFs  Emotional Assessment Appearance:: Appears stated age Orientation: : Oriented to Self, Oriented to Place, Oriented to  Time, Oriented to Situation Alcohol / Substance Use: Not Applicable Psych Involvement: No (comment)  Admission diagnosis:  [place] [W19.XXXA] Closed fracture of right hip, initial encounter ([place]) [S72.001A] Closed displaced fracture of right femoral neck ([place]) [S72.001A] Hip fracture ([place]) [S72.009A] Patient Active Problem List   Diagnosis Date Noted   Hip fracture ([place]) 11/01/2020   Closed displaced fracture of right femoral neck ([place]) 10/29/2020   Discharge of eye, left 10/29/2020   Elevated serum creatinine 10/29/2020   Sprain of anterior talofibular ligament of left ankle 06/22/2020   Acute cystitis without hematuria 05/10/2020   History of colonic polyps 04/28/2020   Urinary frequency 12/16/2019   Cough, persistent 11/06/2019   Acute neck pain 09/26/2019   Seborrheic dermatitis of scalp 09/26/2019   Nocturia 08/22/2019   Leukocytosis 06/13/2018   Seborrheic keratoses 10/16/2017   Hyponatremia 09/14/2017   Chronic  left upper quadrant pain 08/07/2017  Right hip pain 07/12/2017   Hearing loss 02/20/2017   TIA (transient ischemic attack), possible 11/14/2016   Left carpal tunnel syndrome 08/11/2016   Fatigue 06/15/2016   Constipation, chronic 03/21/2016   Near syncope 05/07/2015   Osteoarthritis of right shoulder 05/04/2015   Counseling regarding end of life decision making 01/15/2015   Skin lesion of right leg 07/10/2014   Insomnia 04/10/2014   Shakiness 12/16/2013   Osteoarthritis of right knee 10/14/2013   Osteoarthritis of left knee 10/14/2013   Infected sebaceous cyst 11/29/2012   Gout, chronic, without tophus 12/13/2009   Osteoporosis 10/29/2009   PALPITATIONS, OCCASIONAL 07/07/2008   Allergic rhinitis 06/02/2008   HEMANGIOMA 07/30/2006   Controlled type 2 diabetes mellitus without complication, without long-term current use of insulin ([place]) 07/30/2006   Hyperlipidemia LDL goal <100 07/30/2006   Essential hypertension, benign 07/30/2006   RECTAL POLYPS 07/30/2006   Osteoarthritis 07/30/2006   CATARACT, HX OF 07/30/2006   PCP:  [name], MD Pharmacy:   [id number] [address], [place] [address] Phone: [phone] Fax: [phone]  [place], [place] [address] Phone: [phone] Fax: [phone]  Readmission Risk Interventions No flowsheet data found.

## 2020-11-02 NOTE — Progress Notes (Signed)
PROGRESS NOTE    [name]  NGE:[id number] DOB: [birth year] DOA: 10/29/2020 PCP: [name], MD   Brief Narrative:  87 year old female with history of diet-controlled DM2, HTN, HLD, osteoporosis, leukocytosis comes to the hospital after a fall onto the right hip. She was brought to the hospital and x-ray was found to have a displaced right femoral neck fracture with varus angulation.  Orthopedic surgery consulted.  She underwent total hip arthroplasty anterior approach right on 11/01/2020.  Assessment & Plan:   Principal Problem:   Closed displaced fracture of right femoral neck (HCC) Active Problems:   Controlled type 2 diabetes mellitus without complication, without long-term current use of insulin (HCC)   Hyperlipidemia LDL goal <100   Essential hypertension, benign   Osteoporosis   Leukocytosis   Nocturia   Discharge of eye, left   Elevated serum creatinine   Hip fracture (HCC)  Closed displaced fracture of right femoral neck: S/p surgical repair on 11/01/2020.  Pain controlled.  Management per orthopedics.  CKD stage IIIa: At baseline.  Chronic leukocytosis: No signs of infection.  Monitor.  Chronic hyponatremia: Ranges between 128 and 134.  Stable.  Right shoulder pain/hemarthrosis: X-ray shows hemarthrosis.  No fracture.  Still with pain.  Defer management to orthopedics.  Osteoporosis: Vitamin D is low.  Started on high-dose supplementation.  Type 2 diabetes mellitus: Blood sugar controlled.  Hemoglobin A1c 6.0.  Continue SSI.  Essential hypertension: Blood pressure controlled.  Continue to hold home antihypertensives.  Continue as needed hydralazine.  DVT prophylaxis: SCDs and aspirin 81 mg p.o. twice daily per orthopedics. Place TED hose Start: 11/01/20 1803   Code Status: DNR  Family Communication: None present at bedside.  Plan of care discussed with patient in length and he verbalized understanding and agreed with it.  Status is: Inpatient  Remains  inpatient appropriate because:Inpatient level of care appropriate due to severity of illness  Dispo: The patient is from: Home              Anticipated d/c is to: SNF              Patient currently is not medically stable to d/c.   Difficult to place patient No        Estimated body mass index is 22.49 kg/m as calculated from the following:   Height as of this encounter: 5\' 3"  (1.6 m).   Weight as of this encounter: 57.6 kg.      Nutritional status:  Nutrition Problem: Increased nutrient needs Etiology: hip fracture, post-op healing   Signs/Symptoms: estimated needs   Interventions: Glucerna shake, MVI    Consultants:  Orthopedics  Procedures:  Total hip arthroplasty  Antimicrobials:  Anti-infectives (From admission, onward)    Start     Dose/Rate Route Frequency Ordered Stop   11/02/20 0300  ceFAZolin (ANCEF) 2 g in sodium chloride 0.9 % 100 mL IVPB        2 g 200 mL/hr over 30 Minutes Intravenous Every 6 hours 11/01/20 2044 11/02/20 0335   11/01/20 2100  ceFAZolin (ANCEF) IVPB 2g/100 mL premix  Status:  Discontinued        2 g 200 mL/hr over 30 Minutes Intravenous Every 6 hours 11/01/20 1802 11/01/20 2044   11/01/20 0600  ceFAZolin (ANCEF) IVPB 2g/100 mL premix        2 g 200 mL/hr over 30 Minutes Intravenous On call to O.R. 10/31/20 2144 11/01/20 1541  Subjective: Seen and examined.  Feels better.  Pain controlled.  No new complaint.  Objective: Vitals:   11/02/20 0534 11/02/20 0534 11/02/20 0904 11/02/20 1006  BP: (!) 143/72 (!) 143/72  122/67  Pulse: 91 90  91  Resp: 17 17  16   Temp: 97.7 F (36.5 C) 97.7 F (36.5 C)  98.9 F (37.2 C)  TempSrc: Oral Oral  Oral  SpO2: 99% 98% 95% 95%  Weight:      Height:        Intake/Output Summary (Last 24 hours) at 11/02/2020 1312 Last data filed at 11/02/2020 0843 Gross per 24 hour  Intake 2095.34 ml  Output 500 ml  Net 1595.34 ml   Filed Weights   10/29/20 1029  Weight: 57.6 kg     Examination:  General exam: Appears calm and comfortable  Respiratory system: Clear to auscultation. Respiratory effort normal. Cardiovascular system: S1 & S2 heard, RRR. No JVD, murmurs, rubs, gallops or clicks. No pedal edema. Gastrointestinal system: Abdomen is nondistended, soft and nontender. No organomegaly or masses felt. Normal bowel sounds heard. Central nervous system: Alert and oriented. No focal neurological deficits. Skin: No rashes, lesions or ulcers Psychiatry: Judgement and insight appear normal. Mood & affect appropriate.    Data Reviewed: I have personally reviewed following labs and imaging studies  CBC: Recent Labs  Lab 10/27/20 1159 10/29/20 1037 10/30/20 0114 10/31/20 0332 11/01/20 0137 11/02/20 0324  WBC 11.2* 14.5* 13.2* 15.5* 14.0* 17.4*  NEUTROABS 8.5* 12.3*  --   --   --   --   HGB 12.6 12.9 11.7* 13.3 11.6* 11.2*  HCT 38.0 41.7 34.7* 40.2 34.3* 34.9*  MCV 94.8 100.5* 94.6 95.9 93.7 96.4  PLT 301.0 296 240 254 220 680   Basic Metabolic Panel: Recent Labs  Lab 10/29/20 1037 10/30/20 0114 10/31/20 0332 11/01/20 0137 11/02/20 0324  NA 136 134* 133* 132* 134*  K 4.7 4.2 3.9 3.8 4.4  CL 106 103 96* 99 102  CO2 21* 27 28 25 24   GLUCOSE 101* 117* 141* 153* 132*  BUN 30* 21 18 19 19   CREATININE 1.11* 1.06* 0.99 0.90 0.82  CALCIUM 9.4 9.1 9.7 8.8* 8.5*   GFR: Estimated Creatinine Clearance: 33.9 mL/min (by C-G formula based on SCr of 0.82 mg/dL). Liver Function Tests: Recent Labs  Lab 10/29/20 1037  AST 28  ALT 24  ALKPHOS 55  BILITOT 0.9  PROT 6.7  ALBUMIN 3.6   No results for input(s): LIPASE, AMYLASE in the last 168 hours. No results for input(s): AMMONIA in the last 168 hours. Coagulation Profile: Recent Labs  Lab 10/29/20 1037  INR 1.0   Cardiac Enzymes: No results for input(s): CKTOTAL, CKMB, CKMBINDEX, TROPONINI in the last 168 hours. BNP (last 3 results) No results for input(s): PROBNP in the last 8760  hours. HbA1C: No results for input(s): HGBA1C in the last 72 hours. CBG: Recent Labs  Lab 11/01/20 1155 11/01/20 1328 11/01/20 1647 11/02/20 0749 11/02/20 1215  GLUCAP 135* 139* 135* 124* 203*   Lipid Profile: No results for input(s): CHOL, HDL, LDLCALC, TRIG, CHOLHDL, LDLDIRECT in the last 72 hours. Thyroid Function Tests: No results for input(s): TSH, T4TOTAL, FREET4, T3FREE, THYROIDAB in the last 72 hours. Anemia Panel: No results for input(s): VITAMINB12, FOLATE, FERRITIN, TIBC, IRON, RETICCTPCT in the last 72 hours. Sepsis Labs: No results for input(s): PROCALCITON, LATICACIDVEN in the last 168 hours.  Recent Results (from the past 240 hour(s))  Resp Panel by RT-PCR (Flu A&B, Covid)  Nasopharyngeal Swab     Status: None   Collection Time: 10/29/20 11:23 AM   Specimen: Nasopharyngeal Swab; Nasopharyngeal(NP) swabs in vial transport medium  Result Value Ref Range Status   SARS Coronavirus 2 by RT PCR NEGATIVE NEGATIVE Final    Comment: (NOTE) SARS-CoV-2 target nucleic acids are NOT DETECTED.  The SARS-CoV-2 RNA is generally detectable in upper respiratory specimens during the acute phase of infection. The lowest concentration of SARS-CoV-2 viral copies this assay can detect is 138 copies/mL. A negative result does not preclude SARS-Cov-2 infection and should not be used as the sole basis for treatment or other patient management decisions. A negative result may occur with  improper specimen collection/handling, submission of specimen other than nasopharyngeal swab, presence of viral mutation(s) within the areas targeted by this assay, and inadequate number of viral copies(<138 copies/mL). A negative result must be combined with clinical observations, patient history, and epidemiological information. The expected result is Negative.  Fact Sheet for Patients:  EntrepreneurPulse.com.au  Fact Sheet for Healthcare Providers:   IncredibleEmployment.be  This test is no t yet approved or cleared by the [place] FDA and  has been authorized for detection and/or diagnosis of SARS-CoV-2 by FDA under an Emergency Use Authorization (EUA). This EUA will remain  in effect (meaning this test can be used) for the duration of the COVID-19 declaration under Section 564(b)(1) of the Act, 21 U.S.C.section 360bbb-3(b)(1), unless the authorization is terminated  or revoked sooner.       Influenza A by PCR NEGATIVE NEGATIVE Final   Influenza B by PCR NEGATIVE NEGATIVE Final    Comment: (NOTE) The Xpert Xpress SARS-CoV-2/FLU/RSV plus assay is intended as an aid in the diagnosis of influenza from Nasopharyngeal swab specimens and should not be used as a sole basis for treatment. Nasal washings and aspirates are unacceptable for Xpert Xpress SARS-CoV-2/FLU/RSV testing.  Fact Sheet for Patients: EntrepreneurPulse.com.au  Fact Sheet for Healthcare Providers: IncredibleEmployment.be  This test is not yet approved or cleared by the [place] FDA and has been authorized for detection and/or diagnosis of SARS-CoV-2 by FDA under an Emergency Use Authorization (EUA). This EUA will remain in effect (meaning this test can be used) for the duration of the COVID-19 declaration under Section 564(b)(1) of the Act, 21 U.S.C. section 360bbb-3(b)(1), unless the authorization is terminated or revoked.  Performed at [facility], [place] [address], [place], [place] [address]   Surgical pcr screen     Status: None   Collection Time: 10/31/20  9:45 PM   Specimen: Nasal Mucosa; Nasal Swab  Result Value Ref Range Status   MRSA, PCR NEGATIVE NEGATIVE Final   Staphylococcus aureus NEGATIVE NEGATIVE Final    Comment: (NOTE) The Xpert SA Assay (FDA approved for NASAL specimens in patients 61 years of age and older), is one component of a comprehensive surveillance program.  It is not intended to diagnose infection nor to guide or monitor treatment. Performed at [facility], [place] [address], [place], [place] [address]       Radiology Studies: DG Pelvis Portable  Result Date: 11/01/2020 CLINICAL DATA:  87 year old female status post right hip arthroplasty. EXAM: PORTABLE PELVIS 1-2 VIEWS COMPARISON:  Right hip radiograph dated 10/29/2020. FINDINGS: There is a total right hip arthroplasty. The arthroplasty components appear intact and in anatomic alignment. There is no acute fracture or dislocation. Moderate left hip arthritic changes. The bones are osteopenic. There is degenerative changes of the lower lumbar spine. Postsurgical changes  in the soft tissues of the right hip. IMPRESSION: Status post total right hip arthroplasty. No acute fracture or dislocation. Electronically Signed   By: Anner Crete M.D.   On: 11/01/2020 21:19   DG Shoulder Right Port  Result Date: 11/01/2020 CLINICAL DATA:  Right shoulder pain with limited mobility after a fall. EXAM: PORTABLE RIGHT SHOULDER COMPARISON:  03/28/2016. FINDINGS: No fracture or dislocation. Advanced degenerative changes in the right glenohumeral and acromioclavicular joints. There may be slight increase in the acromial humeral interval when compared with 03/28/2016. Visualized right chest is unremarkable. Visualized right ribs appear intact. IMPRESSION: 1. No fracture or dislocation. Difficult to exclude a hemarthrosis in the right shoulder. 2. 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 Electronically Signed   By: Lorin Picket M.D.   On: 11/01/2020 08:26   DG C-Arm 1-60 Min-No Report  Result Date: 11/01/2020 Fluoroscopy was utilized by the requesting physician.  No radiographic interpretation.   DG HIP OPERATIVE UNILAT W OR W/O PELVIS RIGHT  Result Date: 11/01/2020 CLINICAL DATA:  Right total hip replacement. EXAM: OPERATIVE right HIP (WITH PELVIS IF PERFORMED) 2 VIEWS TECHNIQUE:  Fluoroscopic spot image(s) were submitted for interpretation post-operatively. COMPARISON:  Right hip radiographs 10/29/2020 FINDINGS: Right total hip arthroplasty noted. Intraoperative fluoroscopic images demonstrate no radiographic evidence for complication. Acetabular and femoral components appear well seated on these images. IMPRESSION: Right total hip arthroplasty without radiographic evidence for complication. Electronically Signed   By: San Morelle M.D.   On: 11/01/2020 17:02    Scheduled Meds:  aspirin EC  81 mg Oral BID   docusate sodium  100 mg Oral BID   feeding supplement (GLUCERNA SHAKE)  237 mL Oral TID BM   hydrALAZINE  10 mg Oral Q8H   insulin aspart  0-6 Units Subcutaneous TID WC   polyvinyl alcohol  1-2 drop Both Eyes Daily   psyllium  1 packet Oral Daily   Continuous Infusions:  sodium chloride 30 mL/hr at 11/02/20 0843   methocarbamol (ROBAXIN) IV       LOS: 4 days   Time spent: 35-minute   Darliss Cheney, MD [facility]  11/02/2020, 1:12 PM   How to contact the [facility] Attending or Consulting provider [place] or covering provider during after hours [address], for this patient?  Check the care team in [facility] and look for a) attending/consulting TR[facility] provider listed and b) the [facility] team listed. Page or secure chat [phone]. Log into [email or web address] and use [place]'s universal password to access. If you do not have the password, please contact the hospital operator. Locate the [facility] provider you are looking for under [facility] and page to a number that you can be directly reached. If you still have difficulty reaching the provider, please page the [facility] (Director on Call) for the Hospitalists listed on amion for assistance.

## 2020-11-02 NOTE — Care Management Important Message (Signed)
Important Message  Patient Details IM Letter placed in Patient's room. Name: [name] MRN: [id number] Date of Birth: 1923-04-06   Medicare Important Message Given:  Yes     Kerin Salen 11/02/2020, 11:27 AM

## 2020-11-02 NOTE — Evaluation (Signed)
Physical Therapy Evaluation Patient Details Name: [name] MRN: [id number] DOB: 1922-11-13 Today's Date: 11/02/2020   History of Present Illness  87 y.o. female admitted 10/29/2020 with a fall, R hip fx. s/p R AA-THA 11/01/2020. Past medical history of diet-controlled DM2, HTN, HLD, osteoporosis, leukocytosis.  Clinical Impression  Pt admitted with above diagnosis. +2 total assist for supine to sit, sit to stand, and for stand pivot transfer to recliner.  Pt currently with functional limitations due to the deficits listed below (see PT Problem List). Pt will benefit from skilled PT to increase their independence and safety with mobility to allow discharge to the venue listed below.       Follow Up Recommendations SNF;Supervision for mobility/OOB;Supervision/Assistance - 24 hour    Equipment Recommendations  Rolling walker with 5" wheels;3in1 (PT)    Recommendations for Other Services   OT    Precautions / Restrictions Precautions Precautions: Fall Restrictions Weight Bearing Restrictions: No RLE Weight Bearing: Weight bearing as tolerated      Mobility  Bed Mobility Overal bed mobility: Needs Assistance Bed Mobility: Supine to Sit     Supine to sit: +2 for physical assistance;Total assist     General bed mobility comments: assist to raise trunk, pivot hips, and advance BLEs (pt 10%)    Transfers Overall transfer level: Needs assistance Equipment used: Rolling walker (2 wheeled) Transfers: Sit to/from [facility] Sit to Stand: +2 physical assistance;Total assist Stand pivot transfers: Total assist;+2 physical assistance       General transfer comment: +2 total A to power up, (pt 15%),  VCs hand placement. sit to stand x 2 trials. Pt unable to come to full upright position with RW, nor able to weight shift in standing. So performed side by side stand pivot tx with +2 total assist (pt 15%) bed to recliner.  Ambulation/Gait                 Stairs            Wheelchair Mobility    Modified Rankin (Stroke Patients Only)       Balance Overall balance assessment: Needs assistance   Sitting balance-Leahy Scale: Fair     Standing balance support: Bilateral upper extremity supported Standing balance-Leahy Scale: Zero                               Pertinent Vitals/Pain Pain Assessment: Faces Faces Pain Scale: Hurts whole lot Pain Location: R hip with movement Pain Descriptors / Indicators: Operative site guarding;Guarding;Grimacing Pain Intervention(s): Limited activity within patient's tolerance;Monitored during session;RN gave pain meds during session;Repositioned;Ice applied    Home Living Family/patient expects to be discharged to:: Skilled nursing facility                      Prior Function Level of Independence: Needs assistance         Comments: walked with 3 wheeled RW PTA, independent bathing/dressing, aide comes once a week, family/friends assist with transportation. 4 steps to enter home, B rails (cannot reach both) and required assistance for stairs     Hand Dominance        Extremity/Trunk Assessment   Upper Extremity Assessment Upper Extremity Assessment: RUE deficits/detail RUE Deficits / Details: R shoulder is painful since pt's fall, imaging negative; R elbow flexion/extension +3/5, grip +3/5, will order OT    Lower Extremity Assessment Lower Extremity Assessment: RLE deficits/detail RLE  Deficits / Details: knee ext -3/5, hip ~ 2/5 RLE: Unable to fully assess due to pain RLE Sensation: WNL    Cervical / Trunk Assessment Cervical / Trunk Assessment: Kyphotic  Communication   Communication: HOH  Cognition Arousal/Alertness: Awake/alert Behavior During Therapy: WFL for tasks assessed/performed Overall Cognitive Status: Within Functional Limits for tasks assessed                                        General Comments       Exercises Total Joint Exercises Ankle Circles/Pumps: AROM;Both;10 reps;Supine Heel Slides: AAROM;Right;10 reps;Supine Long Arc Quad: AAROM;Right;5 reps;Supine   Assessment/Plan    PT Assessment Patient needs continued PT services  PT Problem List Decreased strength;Decreased range of motion;Decreased activity tolerance;Decreased balance;Pain;Decreased mobility       PT Treatment Interventions Gait training;Functional mobility training;Therapeutic activities;Therapeutic exercise;Patient/family education    PT Goals (Current goals can be found in the Care Plan section)  Acute Rehab PT Goals Patient Stated Goal: eventually move to an ALF after ST-SNF PT Goal Formulation: With patient/family Time For Goal Achievement: 11/16/20 Potential to Achieve Goals: Fair    Frequency Min 3X/week   Barriers to discharge        Co-evaluation               AM-PAC PT "6 Clicks" Mobility  Outcome Measure Help needed turning from your back to your side while in a flat bed without using bedrails?: Total Help needed moving from lying on your back to sitting on the side of a flat bed without using bedrails?: Total Help needed moving to and from a bed to a chair (including a wheelchair)?: Total Help needed standing up from a chair using your arms (e.g., wheelchair or bedside chair)?: Total Help needed to walk in hospital room?: Total Help needed climbing 3-5 steps with a railing? : Total 6 Click Score: 6    End of Session Equipment Utilized During Treatment: Gait belt Activity Tolerance: Patient limited by pain Patient left: in chair;with call bell/phone within reach;with chair alarm set Nurse Communication: Mobility status PT Visit Diagnosis: Difficulty in walking, not elsewhere classified (R26.2);Pain;Other abnormalities of gait and mobility (R26.89);History of falling (Z91.81) Pain - Right/Left: Right Pain - part of body: Hip    Time: 0947-0962 PT Time Calculation (min) (ACUTE  ONLY): 38 min   Charges:   PT Evaluation $PT Eval High Complexity: 1 High PT Treatments $Therapeutic Activity: 23-37 mins        Blondell Reveal Kistler PT 11/02/2020  Acute Rehabilitation Services Pager [phone] Office [phone]

## 2020-11-03 DIAGNOSIS — Z96641 Presence of right artificial hip joint: Secondary | ICD-10-CM | POA: Diagnosis not present

## 2020-11-03 DIAGNOSIS — R2681 Unsteadiness on feet: Secondary | ICD-10-CM | POA: Diagnosis not present

## 2020-11-03 DIAGNOSIS — H5789 Other specified disorders of eye and adnexa: Secondary | ICD-10-CM | POA: Diagnosis not present

## 2020-11-03 DIAGNOSIS — J309 Allergic rhinitis, unspecified: Secondary | ICD-10-CM | POA: Diagnosis not present

## 2020-11-03 DIAGNOSIS — E119 Type 2 diabetes mellitus without complications: Secondary | ICD-10-CM | POA: Diagnosis not present

## 2020-11-03 DIAGNOSIS — R351 Nocturia: Secondary | ICD-10-CM | POA: Diagnosis not present

## 2020-11-03 DIAGNOSIS — K5900 Constipation, unspecified: Secondary | ICD-10-CM | POA: Diagnosis not present

## 2020-11-03 DIAGNOSIS — I10 Essential (primary) hypertension: Secondary | ICD-10-CM | POA: Diagnosis not present

## 2020-11-03 DIAGNOSIS — R531 Weakness: Secondary | ICD-10-CM | POA: Diagnosis not present

## 2020-11-03 DIAGNOSIS — M62838 Other muscle spasm: Secondary | ICD-10-CM | POA: Diagnosis not present

## 2020-11-03 DIAGNOSIS — M6281 Muscle weakness (generalized): Secondary | ICD-10-CM | POA: Diagnosis not present

## 2020-11-03 DIAGNOSIS — Z9181 History of falling: Secondary | ICD-10-CM | POA: Diagnosis not present

## 2020-11-03 DIAGNOSIS — Z7401 Bed confinement status: Secondary | ICD-10-CM | POA: Diagnosis not present

## 2020-11-03 DIAGNOSIS — S72001A Fracture of unspecified part of neck of right femur, initial encounter for closed fracture: Secondary | ICD-10-CM | POA: Diagnosis not present

## 2020-11-03 DIAGNOSIS — Z7901 Long term (current) use of anticoagulants: Secondary | ICD-10-CM | POA: Diagnosis not present

## 2020-11-03 DIAGNOSIS — R7989 Other specified abnormal findings of blood chemistry: Secondary | ICD-10-CM | POA: Diagnosis not present

## 2020-11-03 DIAGNOSIS — Z4789 Encounter for other orthopedic aftercare: Secondary | ICD-10-CM | POA: Diagnosis not present

## 2020-11-03 DIAGNOSIS — E785 Hyperlipidemia, unspecified: Secondary | ICD-10-CM | POA: Diagnosis not present

## 2020-11-03 DIAGNOSIS — H04123 Dry eye syndrome of bilateral lacrimal glands: Secondary | ICD-10-CM | POA: Diagnosis not present

## 2020-11-03 DIAGNOSIS — W19XXXA Unspecified fall, initial encounter: Secondary | ICD-10-CM | POA: Diagnosis not present

## 2020-11-03 DIAGNOSIS — W19XXXD Unspecified fall, subsequent encounter: Secondary | ICD-10-CM | POA: Diagnosis not present

## 2020-11-03 DIAGNOSIS — R279 Unspecified lack of coordination: Secondary | ICD-10-CM | POA: Diagnosis not present

## 2020-11-03 DIAGNOSIS — M810 Age-related osteoporosis without current pathological fracture: Secondary | ICD-10-CM | POA: Diagnosis not present

## 2020-11-03 DIAGNOSIS — D72829 Elevated white blood cell count, unspecified: Secondary | ICD-10-CM | POA: Diagnosis not present

## 2020-11-03 DIAGNOSIS — S72001D Fracture of unspecified part of neck of right femur, subsequent encounter for closed fracture with routine healing: Secondary | ICD-10-CM | POA: Diagnosis not present

## 2020-11-03 DIAGNOSIS — T07XXXA Unspecified multiple injuries, initial encounter: Secondary | ICD-10-CM | POA: Diagnosis not present

## 2020-11-03 DIAGNOSIS — M25551 Pain in right hip: Secondary | ICD-10-CM | POA: Diagnosis not present

## 2020-11-03 LAB — BASIC METABOLIC PANEL
Anion gap: 7 (ref 5–15)
BUN: 34 mg/dL — ABNORMAL HIGH (ref 8–23)
CO2: 24 mmol/L (ref 22–32)
Calcium: 8.9 mg/dL (ref 8.9–10.3)
Chloride: 103 mmol/L (ref 98–111)
Creatinine, Ser: 0.87 mg/dL (ref 0.44–1.00)
GFR, Estimated: >60 mL/min (ref >60)
Glucose, Bld: 138 mg/dL — ABNORMAL HIGH (ref 70–99)
Potassium: 4.5 mmol/L (ref 3.5–5.1)
Sodium: 134 mmol/L — ABNORMAL LOW (ref 135–145)

## 2020-11-03 LAB — CBC
HCT: 34.8 % — ABNORMAL LOW (ref 36.0–46.0)
Hemoglobin: 11.2 g/dL — ABNORMAL LOW (ref 12.0–15.0)
MCH: 31.5 pg (ref 26.0–34.0)
MCHC: 32.2 g/dL (ref 30.0–36.0)
MCV: 97.8 fL (ref 80.0–100.0)
Platelets: 256 10*3/uL (ref 150–400)
RBC: 3.56 MIL/uL — ABNORMAL LOW (ref 3.87–5.11)
RDW: 13.9 % (ref 11.5–15.5)
WBC: 19.1 10*3/uL — ABNORMAL HIGH (ref 4.0–10.5)
nRBC: 0 % (ref 0.0–0.2)

## 2020-11-03 LAB — GLUCOSE, CAPILLARY
Glucose-Capillary: 129 mg/dL — ABNORMAL HIGH (ref 70–99)
Glucose-Capillary: 164 mg/dL — ABNORMAL HIGH (ref 70–99)
Glucose-Capillary: 143 mg/dL — ABNORMAL HIGH (ref 70–99)

## 2020-11-03 LAB — SARS CORONAVIRUS 2 (TAT 6-24 HRS): SARS Coronavirus 2: NEGATIVE

## 2020-11-03 MED ORDER — TRAMADOL HCL 50 MG PO TABS: 50.0000 mg | ORAL_TABLET | Freq: Four times a day (QID) | ORAL | 0 refills | Status: DC | PRN

## 2020-11-03 MED ORDER — ASPIRIN 81 MG PO TBEC: 81.0000 mg | DELAYED_RELEASE_TABLET | Freq: Two times a day (BID) | ORAL | 0 refills | Status: AC

## 2020-11-03 MED ORDER — HYDROCODONE-ACETAMINOPHEN 5-325 MG PO TABS: 1.0000 | ORAL_TABLET | Freq: Four times a day (QID) | ORAL | 0 refills | Status: DC | PRN

## 2020-11-03 MED ORDER — ASPIRIN 81 MG PO TBEC: 81.0000 mg | DELAYED_RELEASE_TABLET | Freq: Two times a day (BID) | ORAL | 0 refills | Status: DC

## 2020-11-03 MED ORDER — BISACODYL 10 MG RE SUPP
10.0000 mg | Freq: Once | RECTAL | Status: AC
  Administered 2020-11-03: 10 mg via RECTAL
  Filled 2020-11-03: qty 1

## 2020-11-03 MED ORDER — METHOCARBAMOL 500 MG PO TABS: 500.0000 mg | ORAL_TABLET | Freq: Four times a day (QID) | ORAL | 0 refills | Status: DC | PRN

## 2020-11-03 MED ORDER — METHOCARBAMOL 500 MG PO TABS
500.0000 mg | ORAL_TABLET | Freq: Four times a day (QID) | ORAL | 0 refills | Status: DC | PRN
Start: 2020-11-03 — End: 2021-06-14

## 2020-11-03 NOTE — Progress Notes (Signed)
Report given to [facility] at [facility]. Patient to be picked up by [facility].

## 2020-11-03 NOTE — Progress Notes (Signed)
Occupational Therapy Evaluation  Patient presenting with functional deficits listed below due to recent fall and hip fracture s/p repair. At baseline patient was able to ambulate with walker and perform dressing/bathing tasks at mod I level. Patient now with R LE pain, impaired global strength, activity tolerance and balance needing set up to min A for upper body ADL and max to total A for lower body ADLs. Patient needing initial max x2 to power up to standing with difficulty extending at knees, then mod x2 to complete pivot transfer with walker first to bedside commode then to recliner. Patient needing cues for hand placement and total A for peri care due to heavy reliance on arms on walker and mod A from PT to stand. Recommend continued acute OT services to facilitate D/C to venue listed below.     11/03/20 1301  OT Visit Information  Last OT Received On 11/03/20  Assistance Needed +2  PT/OT/SLP Co-Evaluation/Treatment Yes  Reason for Co-Treatment To address functional/ADL transfers;For patient/therapist safety  PT goals addressed during session Mobility/safety with mobility  OT goals addressed during session ADL's and self-care  History of Present Illness 87 y.o. female admitted 10/29/2020 with a fall, R hip fx. s/p R AA-THA 11/01/2020. Past medical history of diet-controlled DM2, HTN, HLD, osteoporosis, leukocytosis.  Precautions  Precautions Fall  Restrictions  RLE Weight Bearing WBAT  Home Living  Family/patient expects to be discharged to: Skilled nursing facility  Prior Function  Level of Independence Needs assistance  Comments walked with 3 wheeled RW PTA, independent bathing/dressing, aide comes once a week, family/friends assist with transportation. 4 steps to enter home, B rails (cannot reach both) and required assistance for stairs  Communication  Communication HOH  Pain Assessment  Pain Assessment Faces  Faces Pain Scale 6  Pain Location R hip with movement  Pain Descriptors /  Indicators Operative site guarding;Guarding;Grimacing  Pain Intervention(s) Monitored during session  Cognition  Arousal/Alertness Awake/alert  Behavior During Therapy WFL for tasks assessed/performed  Overall Cognitive Status Within Functional Limits for tasks assessed  Upper Extremity Assessment  Upper Extremity Assessment Generalized weakness  RUE Deficits / Details daughters report patient has bilateral shoulder arthritis and was supposed to go for cortizone shots but then she fell  Lower Extremity Assessment  Lower Extremity Assessment Defer to PT evaluation  Cervical / Trunk Assessment  Cervical / Trunk Assessment Kyphotic  ADL  Overall ADL's  Needs assistance/impaired  Eating/Feeding Set up;Sitting  Grooming Set up;Sitting  Upper Body Bathing Sitting;Minimal assistance  Lower Body Bathing Maximal assistance;Sitting/lateral leans;Sit to/from stand  Upper Body Dressing  Minimal assistance;Sitting  Lower Body Dressing Total assistance;Sitting/lateral leans  Lower Body Dressing Details (indicate cue type and reason) to pull up socks, limited 2* pain  Toilet Transfer Moderate assistance;+2 for physical assistance;+2 for safety/equipment;Stand-pivot;Cueing for safety;Cueing for sequencing;BSC;RW  Toilet Transfer Details (indicate cue type and reason) needing step by step cues for hand placement, assist to turn walker and mod x2 for standing balance to slide feet to bedside commode. patient progress to being able to take very small steps from commode to recliner  Toileting- Clothing Manipulation and Hygiene Total assistance;Sit to/from stand  Toileting - Clothing Manipulation Details (indicate cue type and reason) heavily reliant on UE support on walker and mod A from PT in standing for OT to complete perianal care  Functional mobility during ADLs Moderate assistance;+2 for physical assistance;+2 for safety/equipment;Cueing for safety;Cueing for sequencing;Rolling walker  General ADL  Comments at baseline patient was dressing and  bathing herself, now needing significant assistance due to pain, impaired strength, activity tolerance, balance, safety  Bed Mobility  Overal bed mobility Needs Assistance  Bed Mobility Supine to Sit  Supine to sit +2 for physical assistance;Total assist  General bed mobility comments assist to raise trunk, pivot hips, and advance BLEs (pt 10%)  Transfers  Overall transfer level Needs assistance  Equipment used Rolling walker (2 wheeled)  Transfers Sit to/from [employer] Transfers  Sit to Stand +2 physical assistance;Max assist;+2 safety/equipment  Stand pivot transfers Mod assist;+2 safety/equipment;+2 physical assistance  General transfer comment +2 max assist to power up with initial difficulty extending knees. VCs hand placement, sit to stand x 3 trials, SPT bed to bedside commode then to recliner. Pt able to take several shuffling steps for SPT.  Balance  Overall balance assessment Needs assistance  Sitting-balance support Feet supported  Sitting balance-Leahy Scale Fair  Standing balance support Bilateral upper extremity supported  Standing balance-Leahy Scale Poor  OT - End of Session  Equipment Utilized During Treatment Gait belt;Rolling walker  Activity Tolerance Patient tolerated treatment well  Patient left in chair;with call bell/phone within reach;with chair alarm set;with family/visitor present  Nurse Communication Mobility status;Other (comment) (able to have bowel movement)  OT Assessment  OT Recommendation/Assessment Patient needs continued OT Services  OT Visit Diagnosis Unsteadiness on feet (R26.81);Other abnormalities of gait and mobility (R26.89);Muscle weakness (generalized) (M62.81);History of falling (Z91.81);Pain  Pain - Right/Left Right  Pain - part of body Hip  OT Problem List Decreased strength;Decreased activity tolerance;Impaired balance (sitting and/or standing);Decreased safety awareness;Decreased  knowledge of use of DME or AE;Pain  OT Plan  OT Frequency (ACUTE ONLY) Min 2X/week  OT Treatment/Interventions (ACUTE ONLY) Self-care/ADL training;Therapeutic exercise;DME and/or AE instruction;Therapeutic activities;Patient/family education;Balance training  AM-PAC OT "6 Clicks" Daily Activity Outcome Measure (Version 2)  Help from another person eating meals? 3  Help from another person taking care of personal grooming? 3  Help from another person toileting, which includes using toliet, bedpan, or urinal? 1  Help from another person bathing (including washing, rinsing, drying)? 2  Help from another person to put on and taking off regular upper body clothing? 3  Help from another person to put on and taking off regular lower body clothing? 1  6 Click Score 13  Progressive Mobility  What is the highest level of mobility based on the progressive mobility assessment? Level 3 (Stands with assist) - Balance while standing  and cannot march in place  Mobility Out of bed for toileting;Out of bed to chair with meals  OT Recommendation  Follow Up Recommendations SNF  OT Equipment 3 in 1 bedside commode  Individuals Consulted  Consulted and Agree with Results and Recommendations Patient  Acute Rehab OT Goals  Patient Stated Goal eventually move to an ALF after ST-SNF  OT Goal Formulation With patient/family  Time For Goal Achievement 11/17/20  Potential to Achieve Goals Good  OT Time Calculation  OT Start Time (ACUTE ONLY) 1212  OT Stop Time (ACUTE ONLY) 1235  OT Time Calculation (min) 23 min  OT General Charges  $OT Visit 1 Visit  OT Evaluation  $OT Eval Low Complexity 1 Low  Written Expression  Dominant Hand  (did not specify)   Delbert Phenix OT OT pager: [phone]

## 2020-11-03 NOTE — TOC Transition Note (Signed)
Transition of Care [facility]) - CM/SW Discharge Note  Patient Details  Name: [name] MRN: [id number] Date of Birth: 03/09/1923  Transition of Care [facility]) CM/SW Contact:  Sherie Don, LCSW Phone Number: 11/03/2020, 2:46 PM  Clinical Narrative: CSW provided family with SNF bed offers. Family selected [facility]. Bed is available today. COVID test was negative. Patient will go to room 105 and the number for report is [phone]. Discharge summary, discharge orders, and SNF transfer report faxed to facility in hub. Medical necessity form done; PTAR scheduled. Discharge packet completed. CSW updated RN. CSW spoke with patient's daughters to answer their questions. TOC signing off.  Final next level of care: Skilled Nursing Facility Barriers to Discharge: Barriers Resolved  Patient Goals and CMS Choice Patient states their goals for this hospitalization and ongoing recovery are:: Discharge to rehab CMS Medicare.gov Compare Post Acute Care list provided to:: Patient Represenative (must comment) Choice offered to / list presented to : Patient, Adult Children  Discharge Placement PASRR number recieved: 11/02/20         Patient chooses bed at: [place], [place] Patient to be transferred to facility by: [facility] Name of family member notified: [name] (daughter) Patient and family notified of of transfer: 11/03/20  Discharge Plan and Services In-house Referral: Clinical Social Work Post Acute Care Choice: [facility]          DME Arranged: N/A DME Agency: NA  Readmission Risk Interventions No flowsheet data found.

## 2020-11-03 NOTE — Progress Notes (Signed)
Physical Therapy Treatment Patient Details Name: [name] MRN: [id number] DOB: Aug 02, 1922 Today's Date: 11/03/2020    History of Present Illness 87 y.o. female admitted 10/29/2020 with a fall, R hip fx. s/p R AA-THA 11/01/2020. Past medical history of diet-controlled DM2, HTN, HLD, osteoporosis, leukocytosis.    PT Comments    Pt was able to participate more with therapy today. +2 max assist for sit to stand, she was able to take several shuffling steps with RW to pivot to bedside commode, then to recliner.  Pt performed RLE exercises with min assist.    Follow Up Recommendations  SNF;Supervision for mobility/OOB;Supervision/Assistance - 24 hour     Equipment Recommendations  Rolling walker with 5" wheels;3in1 (PT)    Recommendations for Other Services       Precautions / Restrictions Precautions Precautions: Fall Restrictions Weight Bearing Restrictions: No RLE Weight Bearing: Weight bearing as tolerated    Mobility  Bed Mobility Overal bed mobility: Needs Assistance Bed Mobility: Supine to Sit     Supine to sit: +2 for physical assistance;Total assist     General bed mobility comments: assist to raise trunk, pivot hips, and advance BLEs (pt 10%)    Transfers Overall transfer level: Needs assistance Equipment used: Rolling walker (2 wheeled) Transfers: Sit to/from [facility] Sit to Stand: +2 physical assistance;Max assist;+2 safety/equipment Stand pivot transfers: Mod assist;+2 safety/equipment;+2 physical assistance       General transfer comment: +2 max assist to power up (pt 40%), VCs hand placement, sit to stand x 3 trials, SPT bed to bedside commode then to recliner. Pt able to take several shuffling steps for SPT.  Ambulation/Gait                 Stairs             Wheelchair Mobility    Modified Rankin (Stroke Patients Only)       Balance Overall balance assessment: Needs assistance Sitting-balance support:  Feet supported;Single extremity supported Sitting balance-Leahy Scale: Fair     Standing balance support: Bilateral upper extremity supported Standing balance-Leahy Scale: Poor                              Cognition Arousal/Alertness: Awake/alert Behavior During Therapy: WFL for tasks assessed/performed Overall Cognitive Status: Within Functional Limits for tasks assessed                                        Exercises Total Joint Exercises Ankle Circles/Pumps: AROM;Both;10 reps;Supine Heel Slides: AAROM;Right;10 reps;Supine Hip ABduction/ADduction: AAROM;Right;10 reps;Supine    General Comments        Pertinent Vitals/Pain Faces Pain Scale: Hurts even more Pain Location: R hip with movement Pain Descriptors / Indicators: Operative site guarding;Guarding;Grimacing Pain Intervention(s): Limited activity within patient's tolerance;Monitored during session;Premedicated before session;Ice applied;Repositioned    Home Living                      Prior Function            PT Goals (current goals can now be found in the care plan section) Acute Rehab PT Goals Patient Stated Goal: eventually move to an ALF after ST-SNF PT Goal Formulation: With patient/family Time For Goal Achievement: 11/16/20 Potential to Achieve Goals: Fair Progress towards PT goals: Progressing toward goals  Frequency    Min 3X/week      PT Plan Current plan remains appropriate    Co-evaluation PT/OT/SLP Co-Evaluation/Treatment: Yes Reason for Co-Treatment: Complexity of the patient's impairments (multi-system involvement);For patient/therapist safety;To address functional/ADL transfers PT goals addressed during session: Mobility/safety with mobility;Balance;Proper use of DME;Strengthening/ROM        AM-PAC PT "6 Clicks" Mobility   Outcome Measure  Help needed turning from your back to your side while in a flat bed without using bedrails?:  Total Help needed moving from lying on your back to sitting on the side of a flat bed without using bedrails?: Total Help needed moving to and from a bed to a chair (including a wheelchair)?: Total Help needed standing up from a chair using your arms (e.g., wheelchair or bedside chair)?: Total Help needed to walk in hospital room?: Total Help needed climbing 3-5 steps with a railing? : Total 6 Click Score: 6    End of Session Equipment Utilized During Treatment: Gait belt Activity Tolerance: Patient limited by pain;Patient tolerated treatment well;Patient limited by fatigue Patient left: in chair;with call bell/phone within reach;with chair alarm set;with family/visitor present Nurse Communication: Mobility status PT Visit Diagnosis: Difficulty in walking, not elsewhere classified (R26.2);Pain;Other abnormalities of gait and mobility (R26.89);History of falling (Z91.81) Pain - Right/Left: Right Pain - part of body: Hip     Time: 1212-1242 PT Time Calculation (min) (ACUTE ONLY): 30 min  Charges:  $Therapeutic Activity: 8-22 mins                     Blondell Reveal Kistler PT 11/03/2020  Acute Rehabilitation Services Pager [phone] Office [phone]

## 2020-11-03 NOTE — Plan of Care (Signed)
Goals for plan of care adequate for discharge.

## 2020-11-03 NOTE — Progress Notes (Signed)
Subjective: 2 Days Post-Op Procedure(s) (LRB): TOTAL HIP ARTHROPLASTY ANTERIOR APPROACH (Right) Patient reports pain as mild.   Patient seen in rounds for Dr. Wynelle Link. Patient is concerned about constipation, has not had bowel movement in a few days. Discussed that this was normal with the opioid medications, has stool softeners ordered. Positive flatus. Denies abdominal pain. Pain well controlled currently. Plan is to go Skilled nursing facility after hospital stay.  Objective: Vital signs in last 24 hours: Temp:  [97.6 F (36.4 C)-98.9 F (37.2 C)] 98 F (36.7 C) (07/13 0552) Pulse Rate:  [78-91] 78 (07/13 0552) Resp:  [16-18] 17 (07/13 0552) BP: (122-156)/(64-87) 156/87 (07/13 0552) SpO2:  [94 %-99 %] 98 % (07/13 0552)  Intake/Output from previous day:  Intake/Output Summary (Last 24 hours) at 11/03/2020 0747 Last data filed at 11/03/2020 0553 Gross per 24 hour  Intake 321.59 ml  Output 650 ml  Net -328.41 ml    Intake/Output this shift: No intake/output data recorded.  Labs: Recent Labs    11/01/20 0137 11/02/20 0324 11/03/20 0318  HGB 11.6* 11.2* 11.2*   Recent Labs    11/02/20 0324 11/03/20 0318  WBC 17.4* 19.1*  RBC 3.62* 3.56*  HCT 34.9* 34.8*  PLT 205 256   Recent Labs    11/02/20 0324 11/03/20 0318  NA 134* 134*  K 4.4 4.5  CL 102 103  CO2 24 24  BUN 19 34*  CREATININE 0.82 0.87  GLUCOSE 132* 138*  CALCIUM 8.5* 8.9   No results for input(s): LABPT, INR in the last 72 hours.  Exam: General - Patient is Alert and Oriented Extremity - Neurologically intact Neurovascular intact Sensation intact distally Dorsiflexion/Plantar flexion intact Dressing/Incision - clean, dry, no drainage Motor Function - intact, moving foot and toes well on exam.   Past Medical History:  Diagnosis Date   Acute pharyngitis    Acute sinusitis, unspecified    Acute upper respiratory infections of unspecified site    Allergy    Anal and rectal polyp     Cataract    History of   Cavernous angioma    Degenerative cervical disc 02/2006   Diabetes mellitus    Type II   GERD (gastroesophageal reflux disease)    Gout    Hemangioma of unspecified site    Hyperlipidemia    Hypertension    Orthostatic hypotension 04/2006   Hospital   Osteoarthritis    Osteopenia    Other abscess of vulva    Palpitations    Syncope and collapse    1 episode    Assessment/Plan: 2 Days Post-Op Procedure(s) (LRB): TOTAL HIP ARTHROPLASTY ANTERIOR APPROACH (Right) Principal Problem:   Closed displaced fracture of right femoral neck (HCC) Active Problems:   Controlled type 2 diabetes mellitus without complication, without long-term current use of insulin (HCC)   Hyperlipidemia LDL goal <100   Essential hypertension, benign   Osteoporosis   Leukocytosis   Nocturia   Discharge of eye, left   Elevated serum creatinine   Hip fracture (HCC)  Estimated body mass index is 22.49 kg/m as calculated from the following:   Height as of this encounter: 5\' 3"  (1.6 m).   Weight as of this encounter: 57.6 kg. Up with therapy  DVT Prophylaxis - Aspirin Weight-bearing as tolerated  Continue working with PT until bed arranged with SNF. Scripts printed and placed in chart.  Will follow-up with our clinic in 2 weeks.   The PDMP database was reviewed today prior to  any opioid medications being prescribed to this patient.   Theresa Duty, PA-C Orthopedic Surgery [phone] 11/03/2020, 7:47 AM

## 2020-11-03 NOTE — Progress Notes (Signed)
Discharged to [facility] via [facility] in no apparent distress.  Belongings sent with the patient's daughters.

## 2020-11-03 NOTE — Discharge Summary (Addendum)
Physician Discharge Summary  [name]:[id number] DOB: 25-Apr-1922 DOA: 10/29/2020  PCP: [name] Sanders, MD  Admit date: 10/29/2020 Discharge date: 11/03/2020 30 Day Unplanned Readmission Risk Score    Flowsheet Row ED to Hosp-Admission (Current) from 10/29/2020 in [facility]  30 Day Unplanned Readmission Risk Score (%) 12.71 Filed at 11/03/2020 0801       This score is the patient's risk of an unplanned readmission within 30 days of being discharged (0 -100%). The score is based on dignosis, age, lab data, medications, orders, and past utilization.   Low:  0-14.9   Medium: 15-21.9   High: 22-29.9   Extreme: 30 and above           Admitted From: Home Disposition: SNF  Recommendations for Outpatient Follow-up:  Follow up with PCP in 1-2 weeks Follow-up with orthopedics in 2 weeks Please obtain BMP/CBC in one week Please follow up with your PCP on the following pending results: Unresulted Labs (From admission, onward)     Start     Ordered   11/02/20 0500  CBC  Daily,   R      11/01/20 [place]: None Equipment/Devices: None  Discharge Condition: Stable CODE STATUS: DNR Diet recommendation: Cardiac  Subjective: Seen and examined.  She has no complaints other than painBut she looks very comfortable.  Brief/Interim Summary: 87 year old female with history of diet-controlled DM2, HTN, HLD, osteoporosis, leukocytosis comes to the hospital after a fall onto the right hip. She was brought to the hospital and x-ray was found to have a displaced right femoral neck fracture with varus angulation.  Known history of osteoporosis is felt to have played a role.  Orthopedic surgery consulted.  She underwent total hip arthroplasty anterior approach right on 11/01/2020 and did very well postoperatively.  Seen by PT OT who recommended SNF.  She has been seen by orthopedic and they have cleared her for discharge and will follow up with her in  the office in 2 weeks as well.  Orthopedics have also prescribed her pain medications as well as doubled her aspirin 81 mg to twice daily for 21 days for DVT prophylaxis.  Rest of the medical issues remained stable.  She is being discharged in stable condition to SNF.  ADDENDUM (7/13 8pm)  Prescriptions were printed and signed by Dr. Inda Merlin, night covering provider due to daytime provider already going home for the day.   [name] [name] [facility]  Discharge Diagnoses:  Principal Problem:   Closed displaced fracture of right femoral neck ([place]) thought to be in part caused by history of osteoporosis Active Problems:   Controlled type 2 diabetes mellitus without complication, without long-term current use of insulin (HCC)   Hyperlipidemia LDL goal <100   Essential hypertension, benign   Osteoporosis   Leukocytosis   Nocturia   Discharge of eye, left   Elevated serum creatinine   Hip fracture [facility])    Discharge Instructions  Discharge Instructions     Call MD / Call 911   Complete by: As directed    If you experience chest pain or shortness of breath, CALL 911 and be transported to the hospital emergency room.  If you develope a fever above 101 F, pus (white drainage) or increased drainage or redness at the wound, or calf pain, call your surgeon's office.   Change dressing   Complete by: As directed  You have an adhesive waterproof bandage over the incision. Leave this in place until your first follow-up appointment. Once you remove this you will not need to place another bandage.   Constipation Prevention   Complete by: As directed    Drink plenty of fluids.  Prune juice may be helpful.  You may use a stool softener, such as Colace (over the counter) 100 mg twice a day.  Use MiraLax (over the counter) for constipation as needed.   Diet - low sodium heart healthy   Complete by: As directed    Do not sit on low chairs, stoools or toilet seats, as it may be  difficult to get up from low surfaces   Complete by: As directed    Driving restrictions   Complete by: As directed    No driving for two weeks   Post-operative opioid taper instructions:   Complete by: As directed    POST-OPERATIVE OPIOID TAPER INSTRUCTIONS: It is important to wean off of your opioid medication as soon as possible. If you do not need pain medication after your surgery it is ok to stop day one. Opioids include: Codeine, Hydrocodone(Norco, Vicodin), Oxycodone(Percocet, oxycontin) and hydromorphone amongst others.  Long term and even short term use of opiods can cause: Increased pain response Dependence Constipation Depression Respiratory depression And more.  Withdrawal symptoms can include Flu like symptoms Nausea, vomiting And more Techniques to manage these symptoms Hydrate well Eat regular healthy meals Stay active Use relaxation techniques(deep breathing, meditating, yoga) Do Not substitute Alcohol to help with tapering If you have been on opioids for less than two weeks and do not have pain than it is ok to stop all together.  Plan to wean off of opioids This plan should start within one week post op of your joint replacement. Maintain the same interval or time between taking each dose and first decrease the dose.  Cut the total daily intake of opioids by one tablet each day Next start to increase the time between doses. The last dose that should be eliminated is the evening dose.      TED hose   Complete by: As directed    Use stockings (TED hose) for three weeks on both leg(s).  You may remove them at night for sleeping.   Weight bearing as tolerated   Complete by: As directed       Allergies as of 11/03/2020       Reactions   Codeine Nausea And Vomiting   Other    Other reaction(s): nausea   Statins Other (See Comments)   Pt does not remember the reaction        Medication List     STOP taking these medications     clotrimazole-betamethasone cream Commonly known as: LOTRISONE   triamcinolone cream 0.1 % Commonly known as: KENALOG       TAKE these medications    Accu-Chek Guide test strip Generic drug: glucose blood Use to check blood sugar 1 time daily   Accu-Chek Guide w/Device Kit 1 each by Does not apply route daily. Use to check blood sugar daily as needed.  Dx: E11.9   Accu-Chek Softclix Lancets lancets CHECK BLOOD SUGAR 1 TIME A DAY. DX: E11.9   acetaminophen 325 MG tablet Commonly known as: TYLENOL Take 650 mg by mouth 2 (two) times daily.   aspirin 81 MG EC tablet Take 1 tablet (81 mg total) by mouth 2 (two) times daily for 21 days. Then resume one  81 mg aspirin once a day. What changed:  when to take this additional instructions   Fiber Powd Take 5 mLs by mouth See admin instructions. Mix 1 rounded teaspoonful into 6-8 ounces of water and drink three times a day   fluticasone 50 MCG/ACT nasal spray Commonly known as: FLONASE PLACE 2 SPRAYS INTO BOTH NOSTRILS DAILY AS NEEDED FOR ALLERGIES.   HYDROcodone-acetaminophen 5-325 MG tablet Commonly known as: NORCO/VICODIN Take 1 tablet by mouth every 6 (six) hours as needed for severe pain.   loratadine 10 MG tablet Commonly known as: CLARITIN Take 10 mg by mouth daily.   methocarbamol 500 MG tablet Commonly known as: ROBAXIN Take 1 tablet (500 mg total) by mouth every 6 (six) hours as needed for muscle spasms.   One-A-Day Womens 50+ Advantage Tabs Take 1 tablet by mouth daily with breakfast.   oxybutynin 5 MG tablet Commonly known as: DITROPAN Take 1 tablet (5 mg total) by mouth at bedtime.   polyethylene glycol 17 g packet Commonly known as: MIRALAX / GLYCOLAX Take 17 g by mouth daily as needed for mild constipation (MIX AND DRINK).   ramipril 5 MG capsule Commonly known as: ALTACE TAKE 1 CAPSULE (5 MG TOTAL) BY MOUTH DAILY. What changed: See the new instructions.   Systane Balance 0.6 % Soln Generic  drug: Propylene Glycol Place 1-2 drops into both eyes daily.   traMADol 50 MG tablet Commonly known as: ULTRAM Take 1-2 tablets (50-100 mg total) by mouth every 6 (six) hours as needed for moderate pain.               Discharge Care Instructions  (From admission, onward)           Start     Ordered   11/03/20 0000  Weight bearing as tolerated        11/03/20 0750   11/03/20 0000  Change dressing       Comments: You have an adhesive waterproof bandage over the incision. Leave this in place until your first follow-up appointment. Once you remove this you will not need to place another bandage.   11/03/20 0750            Follow-up Information     Gaynelle Arabian, MD. Schedule an appointment as soon as possible for a visit in 2 week(s).   Specialty: Orthopedic Surgery Contact information: [address] [place] [address] [phone]         [name] Sanders, MD Follow up in 1 week(s).   Specialty: Family Medicine Contact information: [place] [address] [phone]                Allergies  Allergen Reactions   Codeine Nausea And Vomiting   Other     Other reaction(s): nausea   Statins Other (See Comments)    Pt does not remember the reaction    Consultations: Orthopedics   Procedures/Studies: DG Chest 1 View  Result Date: 10/29/2020 CLINICAL DATA:  Fall. EXAM: CHEST  1 VIEW COMPARISON:  Radiographs 01/28/2020 and 09/12/2019. FINDINGS: 1155 hours. The heart size and mediastinal contours are stable with mild aortic atherosclerosis. The lungs appear clear. There is no pleural effusion or pneumothorax. No acute fractures are evident. Advanced glenohumeral degenerative changes are present bilaterally. Telemetry leads overlie the chest. IMPRESSION: Stable chest. No evidence of acute injury or active cardiopulmonary process. Electronically Signed   By: Richardean Sale M.D.   On: 10/29/2020 12:26  DG Pelvis  Portable  Result Date: 11/01/2020 CLINICAL DATA:  87 year old female status post right hip arthroplasty. EXAM: PORTABLE PELVIS 1-2 VIEWS COMPARISON:  Right hip radiograph dated 10/29/2020. FINDINGS: There is a total right hip arthroplasty. The arthroplasty components appear intact and in anatomic alignment. There is no acute fracture or dislocation. Moderate left hip arthritic changes. The bones are osteopenic. There is degenerative changes of the lower lumbar spine. Postsurgical changes in the soft tissues of the right hip. IMPRESSION: Status post total right hip arthroplasty. No acute fracture or dislocation. Electronically Signed   By: Anner Crete M.D.   On: 11/01/2020 21:19   DG Shoulder Right Port  Result Date: 11/01/2020 CLINICAL DATA:  Right shoulder pain with limited mobility after a fall. EXAM: PORTABLE RIGHT SHOULDER COMPARISON:  03/28/2016. FINDINGS: No fracture or dislocation. Advanced degenerative changes in the right glenohumeral and acromioclavicular joints. There may be slight increase in the acromial humeral interval when compared with 03/28/2016. Visualized right chest is unremarkable. Visualized right ribs appear intact. IMPRESSION: 1. No fracture or dislocation. Difficult to exclude a hemarthrosis in the right shoulder. 2. 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 Electronically Signed   By: Lorin Picket M.D.   On: 11/01/2020 08:26   DG C-Arm 1-60 Min-No Report  Result Date: 11/01/2020 Fluoroscopy was utilized by the requesting physician.  No radiographic interpretation.   DG HIP OPERATIVE UNILAT W OR W/O PELVIS RIGHT  Result Date: 11/01/2020 CLINICAL DATA:  Right total hip replacement. EXAM: OPERATIVE right HIP (WITH PELVIS IF PERFORMED) 2 VIEWS TECHNIQUE: Fluoroscopic spot image(s) were submitted for interpretation post-operatively. COMPARISON:  Right hip radiographs 10/29/2020 FINDINGS: Right total hip arthroplasty noted. Intraoperative  fluoroscopic images demonstrate no radiographic evidence for complication. Acetabular and femoral components appear well seated on these images. IMPRESSION: Right total hip arthroplasty without radiographic evidence for complication. Electronically Signed   By: San Morelle M.D.   On: 11/01/2020 17:02   DG HIP UNILAT WITH PELVIS 2-3 VIEWS RIGHT  Result Date: 10/29/2020 CLINICAL DATA:  Hip pain EXAM: DG HIP (WITH OR WITHOUT PELVIS) 2-3V RIGHT COMPARISON:  Radiograph 07/08/2019 FINDINGS: There is a displaced subcapital/transcervical femoral neck fracture with varus angulation. Adjacent soft tissue swelling. Osteopenia. There is moderate bilateral hip osteoarthritis. Bilateral SI joint and lower lumbar spine degenerative change. IMPRESSION: Displaced right femoral neck fracture with varus angulation. Electronically Signed   By: Maurine Simmering   On: 10/29/2020 12:27     Discharge Exam: Vitals:   11/03/20 0552 11/03/20 0955  BP: (!) 156/87 (!) 164/95  Pulse: 78 90  Resp: 17 16  Temp: 98 F (36.7 C) 98 F (36.7 C)  SpO2: 98% 98%   Vitals:   11/02/20 2100 11/03/20 0542 11/03/20 0552 11/03/20 0955  BP: 129/72 (!) 156/87 (!) 156/87 (!) 164/95  Pulse: 88 87 78 90  Resp: 17 18 17 16   Temp: 98.7 F (37.1 C) 98 F (36.7 C) 98 F (36.7 C) 98 F (36.7 C)  TempSrc: Oral Axillary Axillary Oral  SpO2: 99% 98% 98% 98%  Weight:      Height:        General: Pt is alert, awake, not in acute distress Cardiovascular: RRR, S1/S2 +, no rubs, no gallops Respiratory: CTA bilaterally, no wheezing, no rhonchi Abdominal: Soft, NT, ND, bowel sounds + Extremities: no edema, no cyanosis    The results of significant diagnostics from this hospitalization (including imaging, microbiology, ancillary and laboratory) are listed below for reference.     Microbiology:  Recent Results (from the past 240 hour(s))  Resp Panel by RT-PCR (Flu A&B, Covid) Nasopharyngeal Swab     Status: None   Collection Time:  10/29/20 11:23 AM   Specimen: Nasopharyngeal Swab; Nasopharyngeal(NP) swabs in vial transport medium  Result Value Ref Range Status   SARS Coronavirus 2 by RT PCR NEGATIVE NEGATIVE Final    Comment: (NOTE) SARS-CoV-2 target nucleic acids are NOT DETECTED.  The SARS-CoV-2 RNA is generally detectable in upper respiratory specimens during the acute phase of infection. The lowest concentration of SARS-CoV-2 viral copies this assay can detect is 138 copies/mL. A negative result does not preclude SARS-Cov-2 infection and should not be used as the sole basis for treatment or other patient management decisions. A negative result may occur with  improper specimen collection/handling, submission of specimen other than nasopharyngeal swab, presence of viral mutation(s) within the areas targeted by this assay, and inadequate number of viral copies(<138 copies/mL). A negative result must be combined with clinical observations, patient history, and epidemiological information. The expected result is Negative.  Fact Sheet for Patients:  EntrepreneurPulse.com.au  Fact Sheet for Healthcare Providers:  IncredibleEmployment.be  This test is no t yet approved or cleared by the [place] FDA and  has been authorized for detection and/or diagnosis of SARS-CoV-2 by FDA under an Emergency Use Authorization (EUA). This EUA will remain  in effect (meaning this test can be used) for the duration of the COVID-19 declaration under Section 564(b)(1) of the Act, 21 U.S.C.section 360bbb-3(b)(1), unless the authorization is terminated  or revoked sooner.       Influenza A by PCR NEGATIVE NEGATIVE Final   Influenza B by PCR NEGATIVE NEGATIVE Final    Comment: (NOTE) The Xpert Xpress SARS-CoV-2/FLU/RSV plus assay is intended as an aid in the diagnosis of influenza from Nasopharyngeal swab specimens and should not be used as a sole basis for treatment. Nasal washings  and aspirates are unacceptable for Xpert Xpress SARS-CoV-2/FLU/RSV testing.  Fact Sheet for Patients: EntrepreneurPulse.com.au  Fact Sheet for Healthcare Providers: IncredibleEmployment.be  This test is not yet approved or cleared by the [place] FDA and has been authorized for detection and/or diagnosis of SARS-CoV-2 by FDA under an Emergency Use Authorization (EUA). This EUA will remain in effect (meaning this test can be used) for the duration of the COVID-19 declaration under Section 564(b)(1) of the Act, 21 U.S.C. section 360bbb-3(b)(1), unless the authorization is terminated or revoked.  Performed at [facility], [place] [address], [place], [place] [address]   Surgical pcr screen     Status: None   Collection Time: 10/31/20  9:45 PM   Specimen: Nasal Mucosa; Nasal Swab  Result Value Ref Range Status   MRSA, PCR NEGATIVE NEGATIVE Final   Staphylococcus aureus NEGATIVE NEGATIVE Final    Comment: (NOTE) The Xpert SA Assay (FDA approved for NASAL specimens in patients 67 years of age and older), is one component of a comprehensive surveillance program. It is not intended to diagnose infection nor to guide or monitor treatment. Performed at [facility], [place] [address], [place], [place] [address]   SARS CORONAVIRUS 2 (TAT 6-24 HRS) Nasopharyngeal Nasopharyngeal Swab     Status: None   Collection Time: 11/02/20  5:07 PM   Specimen: Nasopharyngeal Swab  Result Value Ref Range Status   SARS Coronavirus 2 NEGATIVE NEGATIVE Final    Comment: (NOTE) SARS-CoV-2 target nucleic acids are NOT DETECTED.  The SARS-CoV-2 RNA is generally detectable in upper and  lower respiratory specimens during the acute phase of infection. Negative results do not preclude SARS-CoV-2 infection, do not rule out co-infections with other pathogens, and should not be used as the sole basis for treatment or other patient management decisions. Negative  results must be combined with clinical observations, patient history, and epidemiological information. The expected result is Negative.  Fact Sheet for Patients: SugarRoll.be  Fact Sheet for Healthcare Providers: https://www.woods-mathews.com/  This test is not yet approved or cleared by the [place] FDA and  has been authorized for detection and/or diagnosis of SARS-CoV-2 by FDA under an Emergency Use Authorization (EUA). This EUA will remain  in effect (meaning this test can be used) for the duration of the COVID-19 declaration under Se ction 564(b)(1) of the Act, 21 U.S.C. section 360bbb-3(b)(1), unless the authorization is terminated or revoked sooner.  Performed at [facility], [place] [address] [place], [place] [address]      Labs: BNP (last 3 results) No results for input(s): BNP in the last 8760 hours. Basic Metabolic Panel: Recent Labs  Lab 10/30/20 0114 10/31/20 0332 11/01/20 0137 11/02/20 0324 11/03/20 0318  NA 134* 133* 132* 134* 134*  K 4.2 3.9 3.8 4.4 4.5  CL 103 96* 99 102 103  CO2 27 28 25 24 24   GLUCOSE 117* 141* 153* 132* 138*  BUN 21 18 19 19  34*  CREATININE 1.06* 0.99 0.90 0.82 0.87  CALCIUM 9.1 9.7 8.8* 8.5* 8.9   Liver Function Tests: Recent Labs  Lab 10/29/20 1037  AST 28  ALT 24  ALKPHOS 55  BILITOT 0.9  PROT 6.7  ALBUMIN 3.6   No results for input(s): LIPASE, AMYLASE in the last 168 hours. No results for input(s): AMMONIA in the last 168 hours. CBC: Recent Labs  Lab 10/27/20 1159 10/29/20 1037 10/30/20 0114 10/31/20 0332 11/01/20 0137 11/02/20 0324 11/03/20 0318  WBC 11.2* 14.5* 13.2* 15.5* 14.0* 17.4* 19.1*  NEUTROABS 8.5* 12.3*  --   --   --   --   --   HGB 12.6 12.9 11.7* 13.3 11.6* 11.2* 11.2*  HCT 38.0 41.7 34.7* 40.2 34.3* 34.9* 34.8*  MCV 94.8 100.5* 94.6 95.9 93.7 96.4 97.8  PLT 301.0 296 240 254 220 205 256   Cardiac Enzymes: No results for input(s): CKTOTAL,  CKMB, CKMBINDEX, TROPONINI in the last 168 hours. BNP: Invalid input(s): POCBNP CBG: Recent Labs  Lab 11/02/20 0749 11/02/20 1215 11/02/20 1641 11/02/20 2141 11/03/20 0739  GLUCAP 124* 203* 201* 186* 129*   D-Dimer No results for input(s): DDIMER in the last 72 hours. Hgb A1c No results for input(s): HGBA1C in the last 72 hours. Lipid Profile No results for input(s): CHOL, HDL, LDLCALC, TRIG, CHOLHDL, LDLDIRECT in the last 72 hours. Thyroid function studies No results for input(s): TSH, T4TOTAL, T3FREE, THYROIDAB in the last 72 hours.  Invalid input(s): FREET3 Anemia work up No results for input(s): VITAMINB12, FOLATE, FERRITIN, TIBC, IRON, RETICCTPCT in the last 72 hours. Urinalysis    Component Value Date/Time   COLORURINE YELLOW 02/23/2020 1736   APPEARANCEUR HAZY (A) 02/23/2020 1736   LABSPEC 1.016 02/23/2020 1736   PHURINE 5.0 02/23/2020 1736   GLUCOSEU NEGATIVE 02/23/2020 1736   HGBUR NEGATIVE 02/23/2020 1736   BILIRUBINUR Negative 05/21/2020 1210   KETONESUR 5 (A) 02/23/2020 1736   PROTEINUR Negative 05/21/2020 1210   PROTEINUR NEGATIVE 02/23/2020 1736   UROBILINOGEN 0.2 05/21/2020 1210   UROBILINOGEN 0.2 11/21/2013 0951   NITRITE Negative 05/21/2020 1210   NITRITE  POSITIVE (A) 02/23/2020 1736   LEUKOCYTESUR Negative 05/21/2020 1210   LEUKOCYTESUR LARGE (A) 02/23/2020 1736   Sepsis Labs Invalid input(s): PROCALCITONIN,  WBC,  LACTICIDVEN Microbiology Recent Results (from the past 240 hour(s))  Resp Panel by RT-PCR (Flu A&B, Covid) Nasopharyngeal Swab     Status: None   Collection Time: 10/29/20 11:23 AM   Specimen: Nasopharyngeal Swab; Nasopharyngeal(NP) swabs in vial transport medium  Result Value Ref Range Status   SARS Coronavirus 2 by RT PCR NEGATIVE NEGATIVE Final    Comment: (NOTE) SARS-CoV-2 target nucleic acids are NOT DETECTED.  The SARS-CoV-2 RNA is generally detectable in upper respiratory specimens during the acute phase of infection. The  lowest concentration of SARS-CoV-2 viral copies this assay can detect is 138 copies/mL. A negative result does not preclude SARS-Cov-2 infection and should not be used as the sole basis for treatment or other patient management decisions. A negative result may occur with  improper specimen collection/handling, submission of specimen other than nasopharyngeal swab, presence of viral mutation(s) within the areas targeted by this assay, and inadequate number of viral copies(<138 copies/mL). A negative result must be combined with clinical observations, patient history, and epidemiological information. The expected result is Negative.  Fact Sheet for Patients:  EntrepreneurPulse.com.au  Fact Sheet for Healthcare Providers:  IncredibleEmployment.be  This test is no t yet approved or cleared by the [place] FDA and  has been authorized for detection and/or diagnosis of SARS-CoV-2 by FDA under an Emergency Use Authorization (EUA). This EUA will remain  in effect (meaning this test can be used) for the duration of the COVID-19 declaration under Section 564(b)(1) of the Act, 21 U.S.C.section 360bbb-3(b)(1), unless the authorization is terminated  or revoked sooner.       Influenza A by PCR NEGATIVE NEGATIVE Final   Influenza B by PCR NEGATIVE NEGATIVE Final    Comment: (NOTE) The Xpert Xpress SARS-CoV-2/FLU/RSV plus assay is intended as an aid in the diagnosis of influenza from Nasopharyngeal swab specimens and should not be used as a sole basis for treatment. Nasal washings and aspirates are unacceptable for Xpert Xpress SARS-CoV-2/FLU/RSV testing.  Fact Sheet for Patients: EntrepreneurPulse.com.au  Fact Sheet for Healthcare Providers: IncredibleEmployment.be  This test is not yet approved or cleared by the [place] FDA and has been authorized for detection and/or diagnosis of SARS-CoV-2 by FDA under  an Emergency Use Authorization (EUA). This EUA will remain in effect (meaning this test can be used) for the duration of the COVID-19 declaration under Section 564(b)(1) of the Act, 21 U.S.C. section 360bbb-3(b)(1), unless the authorization is terminated or revoked.  Performed at [facility], [place] [address] [place], [place] [address]   Surgical pcr screen     Status: None   Collection Time: 10/31/20  9:45 PM   Specimen: Nasal Mucosa; Nasal Swab  Result Value Ref Range Status   MRSA, PCR NEGATIVE NEGATIVE Final   Staphylococcus aureus NEGATIVE NEGATIVE Final    Comment: (NOTE) The Xpert SA Assay (FDA approved for NASAL specimens in patients 76 years of age and older), is one component of a comprehensive surveillance program. It is not intended to diagnose infection nor to guide or monitor treatment. Performed at [facility], [place] [address] [place], [place] [address]   SARS CORONAVIRUS 2 (TAT 6-24 HRS) Nasopharyngeal Nasopharyngeal Swab     Status: None   Collection Time: 11/02/20  5:07 PM   Specimen: Nasopharyngeal Swab  Result Value Ref Range Status  SARS Coronavirus 2 NEGATIVE NEGATIVE Final    Comment: (NOTE) SARS-CoV-2 target nucleic acids are NOT DETECTED.  The SARS-CoV-2 RNA is generally detectable in upper and lower respiratory specimens during the acute phase of infection. Negative results do not preclude SARS-CoV-2 infection, do not rule out co-infections with other pathogens, and should not be used as the sole basis for treatment or other patient management decisions. Negative results must be combined with clinical observations, patient history, and epidemiological information. The expected result is Negative.  Fact Sheet for Patients: SugarRoll.be  Fact Sheet for Healthcare Providers: https://www.woods-mathews.com/  This test is not yet approved or cleared by the [place] FDA and  has been  authorized for detection and/or diagnosis of SARS-CoV-2 by FDA under an Emergency Use Authorization (EUA). This EUA will remain  in effect (meaning this test can be used) for the duration of the COVID-19 declaration under Se ction 564(b)(1) of the Act, 21 U.S.C. section 360bbb-3(b)(1), unless the authorization is terminated or revoked sooner.  Performed at [facility], [place] [address] [place], [place] [address]      Time coordinating discharge: Over 30 minutes  SIGNED:   Darliss Cheney, MD  [facility] 11/03/2020, 10:42 AM  If 7PM-7AM, please contact night-coverage [email or web address]

## 2020-11-04 DIAGNOSIS — M810 Age-related osteoporosis without current pathological fracture: Secondary | ICD-10-CM | POA: Diagnosis not present

## 2020-11-04 DIAGNOSIS — S72001A Fracture of unspecified part of neck of right femur, initial encounter for closed fracture: Secondary | ICD-10-CM | POA: Diagnosis not present

## 2020-11-04 DIAGNOSIS — I10 Essential (primary) hypertension: Secondary | ICD-10-CM | POA: Diagnosis not present

## 2020-11-04 DIAGNOSIS — E785 Hyperlipidemia, unspecified: Secondary | ICD-10-CM | POA: Diagnosis not present

## 2020-11-09 ENCOUNTER — Ambulatory Visit: Payer: Medicare Other | Admitting: Family Medicine

## 2020-11-15 ENCOUNTER — Ambulatory Visit: Payer: Medicare Other | Admitting: Podiatry

## 2020-11-24 ENCOUNTER — Telehealth: Payer: Self-pay | Admitting: Family Medicine

## 2020-11-24 NOTE — Telephone Encounter (Signed)
Dr. Diona Browner is out of the office until next Tuesday.  FL-2 forms will not be completed until then.

## 2020-11-24 NOTE — Telephone Encounter (Signed)
[name] called stating she will be faxing over [facility] forms that needs to be filled out . She can be reach at [phone]

## 2020-11-28 DIAGNOSIS — M810 Age-related osteoporosis without current pathological fracture: Secondary | ICD-10-CM | POA: Diagnosis not present

## 2020-11-28 DIAGNOSIS — I10 Essential (primary) hypertension: Secondary | ICD-10-CM | POA: Diagnosis not present

## 2020-11-28 DIAGNOSIS — Z9181 History of falling: Secondary | ICD-10-CM | POA: Diagnosis not present

## 2020-11-28 DIAGNOSIS — E785 Hyperlipidemia, unspecified: Secondary | ICD-10-CM | POA: Diagnosis not present

## 2020-11-28 DIAGNOSIS — Z96641 Presence of right artificial hip joint: Secondary | ICD-10-CM | POA: Diagnosis not present

## 2020-11-28 DIAGNOSIS — D72829 Elevated white blood cell count, unspecified: Secondary | ICD-10-CM | POA: Diagnosis not present

## 2020-11-28 DIAGNOSIS — Z7982 Long term (current) use of aspirin: Secondary | ICD-10-CM | POA: Diagnosis not present

## 2020-11-28 DIAGNOSIS — E119 Type 2 diabetes mellitus without complications: Secondary | ICD-10-CM | POA: Diagnosis not present

## 2020-11-28 DIAGNOSIS — S72001D Fracture of unspecified part of neck of right femur, subsequent encounter for closed fracture with routine healing: Secondary | ICD-10-CM | POA: Diagnosis not present

## 2020-12-03 ENCOUNTER — Other Ambulatory Visit: Payer: Self-pay | Admitting: Family Medicine

## 2020-12-03 DIAGNOSIS — S72001D Fracture of unspecified part of neck of right femur, subsequent encounter for closed fracture with routine healing: Secondary | ICD-10-CM | POA: Diagnosis not present

## 2020-12-03 DIAGNOSIS — M810 Age-related osteoporosis without current pathological fracture: Secondary | ICD-10-CM | POA: Diagnosis not present

## 2020-12-03 DIAGNOSIS — E785 Hyperlipidemia, unspecified: Secondary | ICD-10-CM | POA: Diagnosis not present

## 2020-12-03 DIAGNOSIS — E119 Type 2 diabetes mellitus without complications: Secondary | ICD-10-CM | POA: Diagnosis not present

## 2020-12-03 DIAGNOSIS — D72829 Elevated white blood cell count, unspecified: Secondary | ICD-10-CM | POA: Diagnosis not present

## 2020-12-03 DIAGNOSIS — I10 Essential (primary) hypertension: Secondary | ICD-10-CM | POA: Diagnosis not present

## 2020-12-06 DIAGNOSIS — E785 Hyperlipidemia, unspecified: Secondary | ICD-10-CM | POA: Diagnosis not present

## 2020-12-06 DIAGNOSIS — E119 Type 2 diabetes mellitus without complications: Secondary | ICD-10-CM | POA: Diagnosis not present

## 2020-12-06 DIAGNOSIS — S72001D Fracture of unspecified part of neck of right femur, subsequent encounter for closed fracture with routine healing: Secondary | ICD-10-CM | POA: Diagnosis not present

## 2020-12-06 DIAGNOSIS — D72829 Elevated white blood cell count, unspecified: Secondary | ICD-10-CM | POA: Diagnosis not present

## 2020-12-06 DIAGNOSIS — B353 Tinea pedis: Secondary | ICD-10-CM | POA: Insufficient documentation

## 2020-12-06 DIAGNOSIS — I10 Essential (primary) hypertension: Secondary | ICD-10-CM | POA: Diagnosis not present

## 2020-12-06 DIAGNOSIS — M810 Age-related osteoporosis without current pathological fracture: Secondary | ICD-10-CM | POA: Diagnosis not present

## 2020-12-06 NOTE — Assessment & Plan Note (Signed)
Wash socks in hot or bleach water.  Spray shoes with tinactin 1-2 a week.  Apply topical cream to right foot twice daily.

## 2020-12-07 DIAGNOSIS — Z96641 Presence of right artificial hip joint: Secondary | ICD-10-CM | POA: Diagnosis not present

## 2020-12-07 DIAGNOSIS — Z471 Aftercare following joint replacement surgery: Secondary | ICD-10-CM | POA: Diagnosis not present

## 2020-12-08 DIAGNOSIS — E785 Hyperlipidemia, unspecified: Secondary | ICD-10-CM | POA: Diagnosis not present

## 2020-12-08 DIAGNOSIS — D72829 Elevated white blood cell count, unspecified: Secondary | ICD-10-CM | POA: Diagnosis not present

## 2020-12-08 DIAGNOSIS — M810 Age-related osteoporosis without current pathological fracture: Secondary | ICD-10-CM | POA: Diagnosis not present

## 2020-12-08 DIAGNOSIS — I10 Essential (primary) hypertension: Secondary | ICD-10-CM | POA: Diagnosis not present

## 2020-12-08 DIAGNOSIS — E119 Type 2 diabetes mellitus without complications: Secondary | ICD-10-CM | POA: Diagnosis not present

## 2020-12-08 DIAGNOSIS — S72001D Fracture of unspecified part of neck of right femur, subsequent encounter for closed fracture with routine healing: Secondary | ICD-10-CM | POA: Diagnosis not present

## 2020-12-09 DIAGNOSIS — D72829 Elevated white blood cell count, unspecified: Secondary | ICD-10-CM | POA: Diagnosis not present

## 2020-12-09 DIAGNOSIS — S72001D Fracture of unspecified part of neck of right femur, subsequent encounter for closed fracture with routine healing: Secondary | ICD-10-CM | POA: Diagnosis not present

## 2020-12-09 DIAGNOSIS — M810 Age-related osteoporosis without current pathological fracture: Secondary | ICD-10-CM | POA: Diagnosis not present

## 2020-12-09 DIAGNOSIS — E785 Hyperlipidemia, unspecified: Secondary | ICD-10-CM | POA: Diagnosis not present

## 2020-12-09 DIAGNOSIS — I10 Essential (primary) hypertension: Secondary | ICD-10-CM | POA: Diagnosis not present

## 2020-12-09 DIAGNOSIS — E119 Type 2 diabetes mellitus without complications: Secondary | ICD-10-CM | POA: Diagnosis not present

## 2020-12-13 DIAGNOSIS — M810 Age-related osteoporosis without current pathological fracture: Secondary | ICD-10-CM | POA: Diagnosis not present

## 2020-12-13 DIAGNOSIS — E785 Hyperlipidemia, unspecified: Secondary | ICD-10-CM | POA: Diagnosis not present

## 2020-12-13 DIAGNOSIS — S72001D Fracture of unspecified part of neck of right femur, subsequent encounter for closed fracture with routine healing: Secondary | ICD-10-CM | POA: Diagnosis not present

## 2020-12-13 DIAGNOSIS — D72829 Elevated white blood cell count, unspecified: Secondary | ICD-10-CM | POA: Diagnosis not present

## 2020-12-13 DIAGNOSIS — I10 Essential (primary) hypertension: Secondary | ICD-10-CM | POA: Diagnosis not present

## 2020-12-13 DIAGNOSIS — E119 Type 2 diabetes mellitus without complications: Secondary | ICD-10-CM | POA: Diagnosis not present

## 2020-12-15 DIAGNOSIS — M810 Age-related osteoporosis without current pathological fracture: Secondary | ICD-10-CM | POA: Diagnosis not present

## 2020-12-15 DIAGNOSIS — I10 Essential (primary) hypertension: Secondary | ICD-10-CM | POA: Diagnosis not present

## 2020-12-15 DIAGNOSIS — E119 Type 2 diabetes mellitus without complications: Secondary | ICD-10-CM | POA: Diagnosis not present

## 2020-12-15 DIAGNOSIS — S72001D Fracture of unspecified part of neck of right femur, subsequent encounter for closed fracture with routine healing: Secondary | ICD-10-CM | POA: Diagnosis not present

## 2020-12-15 DIAGNOSIS — E785 Hyperlipidemia, unspecified: Secondary | ICD-10-CM | POA: Diagnosis not present

## 2020-12-15 DIAGNOSIS — D72829 Elevated white blood cell count, unspecified: Secondary | ICD-10-CM | POA: Diagnosis not present

## 2020-12-16 DIAGNOSIS — E119 Type 2 diabetes mellitus without complications: Secondary | ICD-10-CM | POA: Diagnosis not present

## 2020-12-16 DIAGNOSIS — I10 Essential (primary) hypertension: Secondary | ICD-10-CM | POA: Diagnosis not present

## 2020-12-16 DIAGNOSIS — D72829 Elevated white blood cell count, unspecified: Secondary | ICD-10-CM | POA: Diagnosis not present

## 2020-12-16 DIAGNOSIS — S72001D Fracture of unspecified part of neck of right femur, subsequent encounter for closed fracture with routine healing: Secondary | ICD-10-CM | POA: Diagnosis not present

## 2020-12-16 DIAGNOSIS — E785 Hyperlipidemia, unspecified: Secondary | ICD-10-CM | POA: Diagnosis not present

## 2020-12-16 DIAGNOSIS — M810 Age-related osteoporosis without current pathological fracture: Secondary | ICD-10-CM | POA: Diagnosis not present

## 2020-12-20 DIAGNOSIS — K5901 Slow transit constipation: Secondary | ICD-10-CM | POA: Diagnosis not present

## 2020-12-20 DIAGNOSIS — J302 Other seasonal allergic rhinitis: Secondary | ICD-10-CM | POA: Diagnosis not present

## 2020-12-20 DIAGNOSIS — N3281 Overactive bladder: Secondary | ICD-10-CM | POA: Diagnosis not present

## 2020-12-20 DIAGNOSIS — E785 Hyperlipidemia, unspecified: Secondary | ICD-10-CM | POA: Diagnosis not present

## 2020-12-20 DIAGNOSIS — D72829 Elevated white blood cell count, unspecified: Secondary | ICD-10-CM | POA: Diagnosis not present

## 2020-12-20 DIAGNOSIS — I10 Essential (primary) hypertension: Secondary | ICD-10-CM | POA: Diagnosis not present

## 2020-12-20 DIAGNOSIS — E119 Type 2 diabetes mellitus without complications: Secondary | ICD-10-CM | POA: Diagnosis not present

## 2020-12-20 DIAGNOSIS — S72001D Fracture of unspecified part of neck of right femur, subsequent encounter for closed fracture with routine healing: Secondary | ICD-10-CM | POA: Diagnosis not present

## 2020-12-20 DIAGNOSIS — M810 Age-related osteoporosis without current pathological fracture: Secondary | ICD-10-CM | POA: Diagnosis not present

## 2020-12-21 DIAGNOSIS — E119 Type 2 diabetes mellitus without complications: Secondary | ICD-10-CM | POA: Diagnosis not present

## 2020-12-21 DIAGNOSIS — I10 Essential (primary) hypertension: Secondary | ICD-10-CM | POA: Diagnosis not present

## 2020-12-21 DIAGNOSIS — S72001D Fracture of unspecified part of neck of right femur, subsequent encounter for closed fracture with routine healing: Secondary | ICD-10-CM | POA: Diagnosis not present

## 2020-12-21 DIAGNOSIS — D72829 Elevated white blood cell count, unspecified: Secondary | ICD-10-CM | POA: Diagnosis not present

## 2020-12-21 DIAGNOSIS — E785 Hyperlipidemia, unspecified: Secondary | ICD-10-CM | POA: Diagnosis not present

## 2020-12-21 DIAGNOSIS — M810 Age-related osteoporosis without current pathological fracture: Secondary | ICD-10-CM | POA: Diagnosis not present

## 2020-12-22 DIAGNOSIS — S72001D Fracture of unspecified part of neck of right femur, subsequent encounter for closed fracture with routine healing: Secondary | ICD-10-CM | POA: Diagnosis not present

## 2020-12-22 DIAGNOSIS — M810 Age-related osteoporosis without current pathological fracture: Secondary | ICD-10-CM | POA: Diagnosis not present

## 2020-12-22 DIAGNOSIS — E119 Type 2 diabetes mellitus without complications: Secondary | ICD-10-CM | POA: Diagnosis not present

## 2020-12-22 DIAGNOSIS — E785 Hyperlipidemia, unspecified: Secondary | ICD-10-CM | POA: Diagnosis not present

## 2020-12-22 DIAGNOSIS — D72829 Elevated white blood cell count, unspecified: Secondary | ICD-10-CM | POA: Diagnosis not present

## 2020-12-22 DIAGNOSIS — I10 Essential (primary) hypertension: Secondary | ICD-10-CM | POA: Diagnosis not present

## 2020-12-23 DIAGNOSIS — E119 Type 2 diabetes mellitus without complications: Secondary | ICD-10-CM | POA: Diagnosis not present

## 2020-12-23 DIAGNOSIS — I10 Essential (primary) hypertension: Secondary | ICD-10-CM | POA: Diagnosis not present

## 2020-12-23 DIAGNOSIS — E782 Mixed hyperlipidemia: Secondary | ICD-10-CM | POA: Diagnosis not present

## 2020-12-23 DIAGNOSIS — E039 Hypothyroidism, unspecified: Secondary | ICD-10-CM | POA: Diagnosis not present

## 2020-12-27 DIAGNOSIS — S72001D Fracture of unspecified part of neck of right femur, subsequent encounter for closed fracture with routine healing: Secondary | ICD-10-CM | POA: Diagnosis not present

## 2020-12-27 DIAGNOSIS — E785 Hyperlipidemia, unspecified: Secondary | ICD-10-CM | POA: Diagnosis not present

## 2020-12-27 DIAGNOSIS — I10 Essential (primary) hypertension: Secondary | ICD-10-CM | POA: Diagnosis not present

## 2020-12-27 DIAGNOSIS — D72829 Elevated white blood cell count, unspecified: Secondary | ICD-10-CM | POA: Diagnosis not present

## 2020-12-27 DIAGNOSIS — M810 Age-related osteoporosis without current pathological fracture: Secondary | ICD-10-CM | POA: Diagnosis not present

## 2020-12-27 DIAGNOSIS — E119 Type 2 diabetes mellitus without complications: Secondary | ICD-10-CM | POA: Diagnosis not present

## 2020-12-28 DIAGNOSIS — S72001D Fracture of unspecified part of neck of right femur, subsequent encounter for closed fracture with routine healing: Secondary | ICD-10-CM | POA: Diagnosis not present

## 2020-12-28 DIAGNOSIS — E785 Hyperlipidemia, unspecified: Secondary | ICD-10-CM | POA: Diagnosis not present

## 2020-12-28 DIAGNOSIS — D72829 Elevated white blood cell count, unspecified: Secondary | ICD-10-CM | POA: Diagnosis not present

## 2020-12-28 DIAGNOSIS — Z9181 History of falling: Secondary | ICD-10-CM | POA: Diagnosis not present

## 2020-12-28 DIAGNOSIS — Z96641 Presence of right artificial hip joint: Secondary | ICD-10-CM | POA: Diagnosis not present

## 2020-12-28 DIAGNOSIS — Z7982 Long term (current) use of aspirin: Secondary | ICD-10-CM | POA: Diagnosis not present

## 2020-12-28 DIAGNOSIS — E119 Type 2 diabetes mellitus without complications: Secondary | ICD-10-CM | POA: Diagnosis not present

## 2020-12-28 DIAGNOSIS — I10 Essential (primary) hypertension: Secondary | ICD-10-CM | POA: Diagnosis not present

## 2020-12-28 DIAGNOSIS — M810 Age-related osteoporosis without current pathological fracture: Secondary | ICD-10-CM | POA: Diagnosis not present

## 2021-01-06 DIAGNOSIS — E119 Type 2 diabetes mellitus without complications: Secondary | ICD-10-CM | POA: Diagnosis not present

## 2021-01-06 DIAGNOSIS — M810 Age-related osteoporosis without current pathological fracture: Secondary | ICD-10-CM | POA: Diagnosis not present

## 2021-01-06 DIAGNOSIS — E785 Hyperlipidemia, unspecified: Secondary | ICD-10-CM | POA: Diagnosis not present

## 2021-01-06 DIAGNOSIS — I10 Essential (primary) hypertension: Secondary | ICD-10-CM | POA: Diagnosis not present

## 2021-01-06 DIAGNOSIS — S72001D Fracture of unspecified part of neck of right femur, subsequent encounter for closed fracture with routine healing: Secondary | ICD-10-CM | POA: Diagnosis not present

## 2021-01-06 DIAGNOSIS — D72829 Elevated white blood cell count, unspecified: Secondary | ICD-10-CM | POA: Diagnosis not present

## 2021-01-17 DIAGNOSIS — M19011 Primary osteoarthritis, right shoulder: Secondary | ICD-10-CM | POA: Diagnosis not present

## 2021-01-17 DIAGNOSIS — M1712 Unilateral primary osteoarthritis, left knee: Secondary | ICD-10-CM | POA: Diagnosis not present

## 2021-01-17 DIAGNOSIS — M19012 Primary osteoarthritis, left shoulder: Secondary | ICD-10-CM | POA: Diagnosis not present

## 2021-01-18 DIAGNOSIS — E785 Hyperlipidemia, unspecified: Secondary | ICD-10-CM | POA: Diagnosis not present

## 2021-01-18 DIAGNOSIS — D72829 Elevated white blood cell count, unspecified: Secondary | ICD-10-CM | POA: Diagnosis not present

## 2021-01-18 DIAGNOSIS — S72001D Fracture of unspecified part of neck of right femur, subsequent encounter for closed fracture with routine healing: Secondary | ICD-10-CM | POA: Diagnosis not present

## 2021-01-18 DIAGNOSIS — I10 Essential (primary) hypertension: Secondary | ICD-10-CM | POA: Diagnosis not present

## 2021-01-18 DIAGNOSIS — E119 Type 2 diabetes mellitus without complications: Secondary | ICD-10-CM | POA: Diagnosis not present

## 2021-01-18 DIAGNOSIS — M810 Age-related osteoporosis without current pathological fracture: Secondary | ICD-10-CM | POA: Diagnosis not present

## 2021-01-24 DIAGNOSIS — K5901 Slow transit constipation: Secondary | ICD-10-CM | POA: Diagnosis not present

## 2021-01-24 DIAGNOSIS — I10 Essential (primary) hypertension: Secondary | ICD-10-CM | POA: Diagnosis not present

## 2021-01-24 DIAGNOSIS — N3281 Overactive bladder: Secondary | ICD-10-CM | POA: Diagnosis not present

## 2021-01-24 DIAGNOSIS — J302 Other seasonal allergic rhinitis: Secondary | ICD-10-CM | POA: Diagnosis not present

## 2021-01-25 DIAGNOSIS — E119 Type 2 diabetes mellitus without complications: Secondary | ICD-10-CM | POA: Diagnosis not present

## 2021-01-25 DIAGNOSIS — D72829 Elevated white blood cell count, unspecified: Secondary | ICD-10-CM | POA: Diagnosis not present

## 2021-01-25 DIAGNOSIS — S72001D Fracture of unspecified part of neck of right femur, subsequent encounter for closed fracture with routine healing: Secondary | ICD-10-CM | POA: Diagnosis not present

## 2021-01-25 DIAGNOSIS — M810 Age-related osteoporosis without current pathological fracture: Secondary | ICD-10-CM | POA: Diagnosis not present

## 2021-01-25 DIAGNOSIS — I10 Essential (primary) hypertension: Secondary | ICD-10-CM | POA: Diagnosis not present

## 2021-01-25 DIAGNOSIS — E785 Hyperlipidemia, unspecified: Secondary | ICD-10-CM | POA: Diagnosis not present

## 2021-01-27 DIAGNOSIS — D72829 Elevated white blood cell count, unspecified: Secondary | ICD-10-CM | POA: Diagnosis not present

## 2021-01-27 DIAGNOSIS — I10 Essential (primary) hypertension: Secondary | ICD-10-CM | POA: Diagnosis not present

## 2021-01-27 DIAGNOSIS — E785 Hyperlipidemia, unspecified: Secondary | ICD-10-CM | POA: Diagnosis not present

## 2021-01-27 DIAGNOSIS — M810 Age-related osteoporosis without current pathological fracture: Secondary | ICD-10-CM | POA: Diagnosis not present

## 2021-01-27 DIAGNOSIS — E119 Type 2 diabetes mellitus without complications: Secondary | ICD-10-CM | POA: Diagnosis not present

## 2021-01-27 DIAGNOSIS — Z7982 Long term (current) use of aspirin: Secondary | ICD-10-CM | POA: Diagnosis not present

## 2021-01-27 DIAGNOSIS — Z96641 Presence of right artificial hip joint: Secondary | ICD-10-CM | POA: Diagnosis not present

## 2021-01-27 DIAGNOSIS — S72001D Fracture of unspecified part of neck of right femur, subsequent encounter for closed fracture with routine healing: Secondary | ICD-10-CM | POA: Diagnosis not present

## 2021-01-27 DIAGNOSIS — Z9181 History of falling: Secondary | ICD-10-CM | POA: Diagnosis not present

## 2021-02-07 DIAGNOSIS — D72829 Elevated white blood cell count, unspecified: Secondary | ICD-10-CM | POA: Diagnosis not present

## 2021-02-07 DIAGNOSIS — S72001D Fracture of unspecified part of neck of right femur, subsequent encounter for closed fracture with routine healing: Secondary | ICD-10-CM | POA: Diagnosis not present

## 2021-02-07 DIAGNOSIS — E785 Hyperlipidemia, unspecified: Secondary | ICD-10-CM | POA: Diagnosis not present

## 2021-02-07 DIAGNOSIS — I10 Essential (primary) hypertension: Secondary | ICD-10-CM | POA: Diagnosis not present

## 2021-02-07 DIAGNOSIS — M810 Age-related osteoporosis without current pathological fracture: Secondary | ICD-10-CM | POA: Diagnosis not present

## 2021-02-07 DIAGNOSIS — E119 Type 2 diabetes mellitus without complications: Secondary | ICD-10-CM | POA: Diagnosis not present

## 2021-02-16 DIAGNOSIS — I129 Hypertensive chronic kidney disease with stage 1 through stage 4 chronic kidney disease, or unspecified chronic kidney disease: Secondary | ICD-10-CM | POA: Diagnosis not present

## 2021-02-21 DIAGNOSIS — R059 Cough, unspecified: Secondary | ICD-10-CM | POA: Diagnosis not present

## 2021-02-21 DIAGNOSIS — N3281 Overactive bladder: Secondary | ICD-10-CM | POA: Diagnosis not present

## 2021-02-21 DIAGNOSIS — R079 Chest pain, unspecified: Secondary | ICD-10-CM | POA: Diagnosis not present

## 2021-02-21 DIAGNOSIS — J302 Other seasonal allergic rhinitis: Secondary | ICD-10-CM | POA: Diagnosis not present

## 2021-02-21 DIAGNOSIS — N1831 Chronic kidney disease, stage 3a: Secondary | ICD-10-CM | POA: Diagnosis not present

## 2021-02-21 DIAGNOSIS — R0789 Other chest pain: Secondary | ICD-10-CM | POA: Diagnosis not present

## 2021-02-21 DIAGNOSIS — K5901 Slow transit constipation: Secondary | ICD-10-CM | POA: Diagnosis not present

## 2021-02-21 DIAGNOSIS — I129 Hypertensive chronic kidney disease with stage 1 through stage 4 chronic kidney disease, or unspecified chronic kidney disease: Secondary | ICD-10-CM | POA: Diagnosis not present

## 2021-02-23 DIAGNOSIS — E119 Type 2 diabetes mellitus without complications: Secondary | ICD-10-CM | POA: Diagnosis not present

## 2021-02-23 DIAGNOSIS — M810 Age-related osteoporosis without current pathological fracture: Secondary | ICD-10-CM | POA: Diagnosis not present

## 2021-02-23 DIAGNOSIS — S72001D Fracture of unspecified part of neck of right femur, subsequent encounter for closed fracture with routine healing: Secondary | ICD-10-CM | POA: Diagnosis not present

## 2021-02-23 DIAGNOSIS — D72829 Elevated white blood cell count, unspecified: Secondary | ICD-10-CM | POA: Diagnosis not present

## 2021-02-23 DIAGNOSIS — I10 Essential (primary) hypertension: Secondary | ICD-10-CM | POA: Diagnosis not present

## 2021-02-23 DIAGNOSIS — E785 Hyperlipidemia, unspecified: Secondary | ICD-10-CM | POA: Diagnosis not present

## 2021-02-26 DIAGNOSIS — I10 Essential (primary) hypertension: Secondary | ICD-10-CM | POA: Diagnosis not present

## 2021-02-26 DIAGNOSIS — M810 Age-related osteoporosis without current pathological fracture: Secondary | ICD-10-CM | POA: Diagnosis not present

## 2021-02-26 DIAGNOSIS — Z9181 History of falling: Secondary | ICD-10-CM | POA: Diagnosis not present

## 2021-02-26 DIAGNOSIS — E119 Type 2 diabetes mellitus without complications: Secondary | ICD-10-CM | POA: Diagnosis not present

## 2021-02-26 DIAGNOSIS — Z23 Encounter for immunization: Secondary | ICD-10-CM | POA: Diagnosis not present

## 2021-02-26 DIAGNOSIS — E785 Hyperlipidemia, unspecified: Secondary | ICD-10-CM | POA: Diagnosis not present

## 2021-02-26 DIAGNOSIS — D72829 Elevated white blood cell count, unspecified: Secondary | ICD-10-CM | POA: Diagnosis not present

## 2021-02-26 DIAGNOSIS — S72001D Fracture of unspecified part of neck of right femur, subsequent encounter for closed fracture with routine healing: Secondary | ICD-10-CM | POA: Diagnosis not present

## 2021-02-26 DIAGNOSIS — Z96641 Presence of right artificial hip joint: Secondary | ICD-10-CM | POA: Diagnosis not present

## 2021-02-26 DIAGNOSIS — Z7982 Long term (current) use of aspirin: Secondary | ICD-10-CM | POA: Diagnosis not present

## 2021-03-11 DIAGNOSIS — D72829 Elevated white blood cell count, unspecified: Secondary | ICD-10-CM | POA: Diagnosis not present

## 2021-03-11 DIAGNOSIS — E119 Type 2 diabetes mellitus without complications: Secondary | ICD-10-CM | POA: Diagnosis not present

## 2021-03-11 DIAGNOSIS — I10 Essential (primary) hypertension: Secondary | ICD-10-CM | POA: Diagnosis not present

## 2021-03-11 DIAGNOSIS — S72001D Fracture of unspecified part of neck of right femur, subsequent encounter for closed fracture with routine healing: Secondary | ICD-10-CM | POA: Diagnosis not present

## 2021-03-11 DIAGNOSIS — E785 Hyperlipidemia, unspecified: Secondary | ICD-10-CM | POA: Diagnosis not present

## 2021-03-11 DIAGNOSIS — M810 Age-related osteoporosis without current pathological fracture: Secondary | ICD-10-CM | POA: Diagnosis not present

## 2021-03-14 DIAGNOSIS — D485 Neoplasm of uncertain behavior of skin: Secondary | ICD-10-CM | POA: Diagnosis not present

## 2021-03-16 DIAGNOSIS — M19012 Primary osteoarthritis, left shoulder: Secondary | ICD-10-CM | POA: Diagnosis not present

## 2021-03-16 DIAGNOSIS — M19011 Primary osteoarthritis, right shoulder: Secondary | ICD-10-CM | POA: Diagnosis not present

## 2021-03-21 DIAGNOSIS — N1831 Chronic kidney disease, stage 3a: Secondary | ICD-10-CM | POA: Diagnosis not present

## 2021-03-21 DIAGNOSIS — E1122 Type 2 diabetes mellitus with diabetic chronic kidney disease: Secondary | ICD-10-CM | POA: Diagnosis not present

## 2021-03-21 DIAGNOSIS — J302 Other seasonal allergic rhinitis: Secondary | ICD-10-CM | POA: Diagnosis not present

## 2021-03-21 DIAGNOSIS — I129 Hypertensive chronic kidney disease with stage 1 through stage 4 chronic kidney disease, or unspecified chronic kidney disease: Secondary | ICD-10-CM | POA: Diagnosis not present

## 2021-03-21 DIAGNOSIS — N3281 Overactive bladder: Secondary | ICD-10-CM | POA: Diagnosis not present

## 2021-03-21 DIAGNOSIS — K5901 Slow transit constipation: Secondary | ICD-10-CM | POA: Diagnosis not present

## 2021-03-23 DIAGNOSIS — I10 Essential (primary) hypertension: Secondary | ICD-10-CM | POA: Diagnosis not present

## 2021-03-23 DIAGNOSIS — M810 Age-related osteoporosis without current pathological fracture: Secondary | ICD-10-CM | POA: Diagnosis not present

## 2021-03-23 DIAGNOSIS — D72829 Elevated white blood cell count, unspecified: Secondary | ICD-10-CM | POA: Diagnosis not present

## 2021-03-23 DIAGNOSIS — E785 Hyperlipidemia, unspecified: Secondary | ICD-10-CM | POA: Diagnosis not present

## 2021-03-23 DIAGNOSIS — E119 Type 2 diabetes mellitus without complications: Secondary | ICD-10-CM | POA: Diagnosis not present

## 2021-03-23 DIAGNOSIS — S72001D Fracture of unspecified part of neck of right femur, subsequent encounter for closed fracture with routine healing: Secondary | ICD-10-CM | POA: Diagnosis not present

## 2021-03-29 DIAGNOSIS — E1142 Type 2 diabetes mellitus with diabetic polyneuropathy: Secondary | ICD-10-CM | POA: Diagnosis not present

## 2021-03-29 DIAGNOSIS — M79675 Pain in left toe(s): Secondary | ICD-10-CM | POA: Diagnosis not present

## 2021-03-29 DIAGNOSIS — B351 Tinea unguium: Secondary | ICD-10-CM | POA: Diagnosis not present

## 2021-03-29 DIAGNOSIS — L605 Yellow nail syndrome: Secondary | ICD-10-CM | POA: Diagnosis not present

## 2021-03-29 DIAGNOSIS — L851 Acquired keratosis [keratoderma] palmaris et plantaris: Secondary | ICD-10-CM | POA: Diagnosis not present

## 2021-03-29 DIAGNOSIS — R2681 Unsteadiness on feet: Secondary | ICD-10-CM | POA: Diagnosis not present

## 2021-04-06 DIAGNOSIS — H6123 Impacted cerumen, bilateral: Secondary | ICD-10-CM | POA: Diagnosis not present

## 2021-05-11 DIAGNOSIS — M47816 Spondylosis without myelopathy or radiculopathy, lumbar region: Secondary | ICD-10-CM | POA: Diagnosis not present

## 2021-05-11 DIAGNOSIS — M5136 Other intervertebral disc degeneration, lumbar region: Secondary | ICD-10-CM | POA: Diagnosis not present

## 2021-05-11 DIAGNOSIS — M4186 Other forms of scoliosis, lumbar region: Secondary | ICD-10-CM | POA: Diagnosis not present

## 2021-05-11 DIAGNOSIS — M5451 Vertebrogenic low back pain: Secondary | ICD-10-CM | POA: Diagnosis not present

## 2021-05-12 ENCOUNTER — Telehealth: Payer: Self-pay

## 2021-05-12 NOTE — Telephone Encounter (Signed)
Unable to reach pt by phone; recording has number no longer listed. I called both #s for [name] [facility] signed) and because end of day left v/m for [name] to call [phone] if Mrs Guion needs something or needs to speak with a nurse. Sending note to Dr Diona Browner and Butch Penny CMA.

## 2021-05-12 NOTE — Telephone Encounter (Signed)
[place] RECORD AccessNurse Patient Name: [name] Gender: Female DOB: 1923-04-22 Age: 88 Y 79 M 2 D Return Phone Number: [phone] (Primary) Address: [address] City/ State/ Zip: [place]  [address] Client [facility] Day - Client Client Site [place] - Day Provider Eliezer Lofts - MD Contact Type Call Who Is Calling Patient / Member / Family / Caregiver Call Type Triage / Clinical Caller Name Denisha Relationship To Patient Other Return Phone Number [phone] (Primary) Chief Complaint Fatigue (greater than THREE MONTHS old) Reason for Call Symptomatic / Request for [place] states calling that her pt is extremely tired and she doesn't want to do anything for herself. Translation No Disp. Time [name] Time) Disposition Final User 05/12/2021 3:26:34 PM Attempt made - line busy Doren Custard, RN, Caryl Pina 05/12/2021 3:48:22 PM Attempt made - no message left Doren Custard, RN, Caryl Pina 05/12/2021 3:58:26 PM FINAL ATTEMPT MADE - no message left Yes Doren Custard, RN, Debbe Bales

## 2021-05-12 NOTE — Telephone Encounter (Signed)
Noted  

## 2021-05-13 NOTE — Telephone Encounter (Signed)
I called [name] (DPR signed) and she said that pt was having same problem she has had for a long time when she does not drink enough water and eat fiber. [name] said that pt is no longer at home; pt is at [facility] [address] in [place] and Nanine Means has a provider assigned to pt at [facility]. Margaret appreciated call and said that pt is OK. Sending note to Dr Diona Browner.

## 2021-05-14 DIAGNOSIS — Z79899 Other long term (current) drug therapy: Secondary | ICD-10-CM | POA: Diagnosis not present

## 2021-05-14 DIAGNOSIS — Z0000 Encounter for general adult medical examination without abnormal findings: Secondary | ICD-10-CM | POA: Diagnosis not present

## 2021-05-14 DIAGNOSIS — R350 Frequency of micturition: Secondary | ICD-10-CM | POA: Diagnosis not present

## 2021-05-14 DIAGNOSIS — E1165 Type 2 diabetes mellitus with hyperglycemia: Secondary | ICD-10-CM | POA: Diagnosis not present

## 2021-05-14 DIAGNOSIS — R5383 Other fatigue: Secondary | ICD-10-CM | POA: Diagnosis not present

## 2021-05-16 DIAGNOSIS — M5459 Other low back pain: Secondary | ICD-10-CM | POA: Diagnosis not present

## 2021-05-16 DIAGNOSIS — E1122 Type 2 diabetes mellitus with diabetic chronic kidney disease: Secondary | ICD-10-CM | POA: Diagnosis not present

## 2021-05-16 DIAGNOSIS — I129 Hypertensive chronic kidney disease with stage 1 through stage 4 chronic kidney disease, or unspecified chronic kidney disease: Secondary | ICD-10-CM | POA: Diagnosis not present

## 2021-05-16 DIAGNOSIS — N1831 Chronic kidney disease, stage 3a: Secondary | ICD-10-CM | POA: Diagnosis not present

## 2021-05-18 ENCOUNTER — Encounter: Payer: Self-pay | Admitting: Dermatology

## 2021-05-18 ENCOUNTER — Ambulatory Visit (INDEPENDENT_AMBULATORY_CARE_PROVIDER_SITE_OTHER): Payer: Medicare Other | Admitting: Dermatology

## 2021-05-18 ENCOUNTER — Other Ambulatory Visit: Payer: Self-pay

## 2021-05-18 DIAGNOSIS — C4492 Squamous cell carcinoma of skin, unspecified: Secondary | ICD-10-CM

## 2021-05-18 DIAGNOSIS — C44311 Basal cell carcinoma of skin of nose: Secondary | ICD-10-CM | POA: Diagnosis not present

## 2021-05-18 DIAGNOSIS — L578 Other skin changes due to chronic exposure to nonionizing radiation: Secondary | ICD-10-CM | POA: Diagnosis not present

## 2021-05-18 DIAGNOSIS — L570 Actinic keratosis: Secondary | ICD-10-CM | POA: Diagnosis not present

## 2021-05-18 DIAGNOSIS — D0471 Carcinoma in situ of skin of right lower limb, including hip: Secondary | ICD-10-CM

## 2021-05-18 DIAGNOSIS — L821 Other seborrheic keratosis: Secondary | ICD-10-CM | POA: Diagnosis not present

## 2021-05-18 DIAGNOSIS — C44629 Squamous cell carcinoma of skin of left upper limb, including shoulder: Secondary | ICD-10-CM | POA: Diagnosis not present

## 2021-05-18 DIAGNOSIS — L814 Other melanin hyperpigmentation: Secondary | ICD-10-CM | POA: Diagnosis not present

## 2021-05-18 DIAGNOSIS — C4491 Basal cell carcinoma of skin, unspecified: Secondary | ICD-10-CM

## 2021-05-18 DIAGNOSIS — D492 Neoplasm of unspecified behavior of bone, soft tissue, and skin: Secondary | ICD-10-CM

## 2021-05-18 DIAGNOSIS — D099 Carcinoma in situ, unspecified: Secondary | ICD-10-CM

## 2021-05-18 HISTORY — DX: Basal cell carcinoma of skin, unspecified: C44.91

## 2021-05-18 HISTORY — DX: Squamous cell carcinoma of skin, unspecified: C44.92

## 2021-05-18 HISTORY — DX: Carcinoma in situ, unspecified: D09.9

## 2021-05-18 MED ORDER — MUPIROCIN 2 % EX OINT: TOPICAL_OINTMENT | CUTANEOUS | 1 refills | Status: DC

## 2021-05-18 NOTE — Patient Instructions (Addendum)
Wound Care Instructions  Cleanse wound gently with soap and water once a day then pat dry with clean gauze. Apply a thing coat of Petrolatum (petroleum jelly, "Vaseline") over the wound (unless you have an allergy to this). We recommend that you use a new, sterile tube of Vaseline. Do not pick or remove scabs. Do not remove the yellow or white "healing tissue" from the base of the wound.  Cover the wound with fresh, clean, nonstick gauze and secure with paper tape. You may use Band-Aids in place of gauze and tape if the would is small enough, but would recommend trimming much of the tape off as there is often too much. Sometimes Band-Aids can irritate the skin.  You should call the office for your biopsy report after 1 week if you have not already been contacted.  If you experience any problems, such as abnormal amounts of bleeding, swelling, significant bruising, significant pain, or evidence of infection, please call the office immediately.  FOR ADULT SURGERY PATIENTS: If you need something for pain relief you may take 1 extra strength Tylenol (acetaminophen) AND 2 Ibuprofen (200mg  each) together every 4 hours as needed for pain. (do not take these if you are allergic to them or if you have a reason you should not take them.) Typically, you may only need pain medication for 1 to 3 days.    Apply Mupirocin ointment once daily with band-aid change.   Recommend using OTC Sarna lotion for itching on back.   Cryotherapy Aftercare  Wash gently with soap and water everyday.   Apply Vaseline and Band-Aid daily until healed.   Prior to procedure, discussed risks of blister formation, small wound, skin dyspigmentation, or rare scar following cryotherapy. Recommend Vaseline ointment to treated areas while healing.    If You Need Anything After Your Visit  If you have any questions or concerns for your doctor, please call our main line at [phone] and press option 4 to reach your doctor's  medical assistant. If no one answers, please leave a voicemail as directed and we will return your call as soon as possible. Messages left after 4 pm will be answered the following business day.   You may also send [place] a message via [place]. We typically respond to MyChart messages within 1-2 business days.  For prescription refills, please ask your pharmacy to contact our office. Our fax number is [phone].  If you have an urgent issue when the clinic is closed that cannot wait until the next business day, you can page your doctor at the number below.    Please note that while we do our best to be available for urgent issues outside of office hours, we are not available 24/7.   If you have an urgent issue and are unable to reach [place], you may choose to seek medical care at your doctor's office, retail clinic, urgent care center, or emergency room.  If you have a medical emergency, please immediately call 911 or go to the emergency department.  Pager Numbers  - Dr. Nehemiah Massed: [phone]  - Dr. Laurence Ferrari: [phone]  - Dr. Nicole Kindred: [phone]  In the event of inclement weather, please call our main line at [phone] for an update on the status of any delays or closures.  Dermatology Medication Tips: Please keep the boxes that topical medications come in in order to help keep track of the instructions about where and how to use these. Pharmacies typically print the medication instructions only on the boxes  and not directly on the medication tubes.   If your medication is too expensive, please contact our office at [phone] option 4 or send [place] a message through [place].   We are unable to tell what your co-pay for medications will be in advance as this is different depending on your insurance coverage. However, we may be able to find a substitute medication at lower cost or fill out paperwork to get insurance to cover a needed medication.   If a prior authorization is required to  get your medication covered by your insurance company, please allow [place] 1-2 business days to complete this process.  Drug prices often vary depending on where the prescription is filled and some pharmacies may offer cheaper prices.  The website www.goodrx.com contains coupons for medications through different pharmacies. The prices here do not account for what the cost may be with help from insurance (it may be cheaper with your insurance), but the website can give you the price if you did not use any insurance.  - You can print the associated coupon and take it with your prescription to the pharmacy.  - You may also stop by our office during regular business hours and pick up a GoodRx coupon card.  - If you need your prescription sent electronically to a different pharmacy, notify our office through [facility] or by phone at [phone] option 4.     Si Usted Necesita Algo Despus de Su Visita  Tambin puede enviarnos un mensaje a travs de Pharmacist, community. Por lo general respondemos a los mensajes de MyChart en el transcurso de 1 a 2 das hbiles.  Para renovar recetas, por favor pida a su farmacia que se ponga en contacto con nuestra oficina. Harland Dingwall de fax es [place] [phone].  Si tiene un asunto urgente cuando la clnica est cerrada y que no puede esperar hasta el siguiente da hbil, puede llamar/localizar a su doctor(a) al nmero que aparece a continuacin.   Por favor, tenga en cuenta que aunque hacemos todo lo posible para estar disponibles para asuntos urgentes fuera del horario de [place], no estamos disponibles las 24 horas del da, los 7 das de la [place].   Si tiene un problema urgente y no puede comunicarse con nosotros, puede optar por buscar atencin mdica  en el consultorio de su doctor(a), en una clnica privada, en un centro de atencin urgente o en una sala de emergencias.  Si tiene Engineering geologist, por favor llame inmediatamente al 911 o vaya a la sala de  emergencias.  Nmeros de bper  - Dr. Nehemiah Massed: [phone]  - Dra. Moye: [phone]  - Dra. Nicole Kindred: [phone]  En caso de inclemencias del [place], por favor llame a Johnsie Kindred principal al [phone] para una actualizacin sobre el [place] de cualquier retraso o cierre.  Consejos para la medicacin en dermatologa: Por favor, guarde las cajas en las que vienen los medicamentos de uso tpico para ayudarle a seguir las instrucciones sobre dnde y cmo usarlos. Las farmacias generalmente imprimen las instrucciones del medicamento slo en las cajas y no directamente en los tubos del [place].   Si su medicamento es muy caro, por favor, pngase en contacto con Zigmund Daniel llamando al [phone] y presione la opcin 4 o envenos un mensaje a travs de Pharmacist, community.   No podemos decirle cul ser su copago por los medicamentos por adelantado ya que esto es diferente dependiendo de la cobertura de su seguro. Sin embargo, es posible que podamos Pension scheme manager  un medicamento sustituto a Electrical engineer un formulario para que el seguro cubra el medicamento que se considera necesario.   Si se requiere una autorizacin previa para que su compaa de seguros [place] su medicamento, por favor permtanos de 1 a 2 das hbiles para completar este proceso.  Los precios de los medicamentos varan con frecuencia dependiendo del Environmental consultant de dnde se surte la receta y alguna farmacias pueden ofrecer precios ms baratos.  El sitio web www.goodrx.com tiene cupones para medicamentos de Airline pilot. Los precios aqu no tienen en cuenta lo que podra costar con la ayuda del seguro (puede ser ms barato con su seguro), pero el sitio web puede darle el precio si no utiliz Research scientist (physical sciences).  - Puede imprimir el cupn correspondiente y llevarlo con su receta a la farmacia.  - Tambin puede pasar por nuestra oficina durante el horario de atencin regular y Charity fundraiser una tarjeta de cupones de GoodRx.  - Si  necesita que su receta se enve electrnicamente a una farmacia diferente, informe a nuestra oficina a travs de MyChart de  o por telfono llamando al [phone] y presione la opcin 4.

## 2021-05-18 NOTE — Progress Notes (Signed)
Follow-Up Visit   Subjective  [name] is a 88 y.o. female who presents for the following: lesions (Check spots. Right side of nose and right lower leg).  Daughter, [name], with patient.   The following portions of the chart were reviewed this encounter and updated as appropriate:  Tobacco   Allergies   Meds   Problems   Med Hx   Surg Hx   Fam Hx       Review of Systems: No other skin or systemic complaints except as noted in HPI or Assessment and Plan.   Objective  Well appearing patient in no apparent distress; mood and affect are within normal limits.  A focused examination was performed including face, hands, arms, legs, back. Relevant physical exam findings are noted in the Assessment and Plan.  Right Nasal Sidewall 1.2 cm scaly pink plaque     Right Lower Leg - Medial 2.4 cm thin pink plaque     Left Dorsal Hand over MCP 0.5cm pink papule with cutaneous horn     Left Dorsal Hand x3, right dorsal hand x1 (4) Hyperkeratotic erythematous papules   Assessment & Plan  Neoplasm of skin (3) Right Nasal Sidewall  Skin / nail biopsy Type of biopsy: tangential   Informed consent: discussed and consent obtained   Anesthesia: the lesion was anesthetized in a standard fashion   Anesthesia comment:  Area prepped with alcohol Anesthetic:  1% lidocaine w/ epinephrine 1-100,000 buffered w/ 8.4% NaHCO3 Instrument used: flexible razor blade   Hemostasis achieved with: pressure, aluminum chloride and electrodesiccation   Outcome: patient tolerated procedure well   Post-procedure details: wound care instructions given   Post-procedure details comment:  Ointment and small bandage applied  mupirocin ointment (BACTROBAN) 2 % Apply twice daily to affected areas until healed  Specimen 1 - Surgical pathology Differential Diagnosis: R/O SCC vs BCC or other  Check Margins: No  Right Lower Leg - Medial  Skin / nail biopsy Type of biopsy: tangential   Informed  consent: discussed and consent obtained   Anesthesia: the lesion was anesthetized in a standard fashion   Anesthesia comment:  Area prepped with alcohol Anesthetic:  1% lidocaine w/ epinephrine 1-100,000 buffered w/ 8.4% NaHCO3 Instrument used: flexible razor blade   Hemostasis achieved with: pressure, aluminum chloride and electrodesiccation   Outcome: patient tolerated procedure well   Post-procedure details: wound care instructions given   Post-procedure details comment:  Ointment and small bandage applied  Specimen 2 - Surgical pathology Differential Diagnosis: R/O SCCis  Check Margins: No  Left Dorsal Hand over MCP  Skin / nail biopsy Type of biopsy: tangential   Informed consent: discussed and consent obtained   Anesthesia: the lesion was anesthetized in a standard fashion   Anesthesia comment:  Area prepped with alcohol Anesthetic:  1% lidocaine w/ epinephrine 1-100,000 buffered w/ 8.4% NaHCO3 Instrument used: flexible razor blade   Hemostasis achieved with: pressure, aluminum chloride and electrodesiccation   Outcome: patient tolerated procedure well   Post-procedure details: wound care instructions given   Post-procedure details comment:  Ointment and small bandage applied  Specimen 3 - Surgical pathology Differential Diagnosis: R/O SCC  Check Margins: No  Call patient's daughter, [name] on cell with results [phone]  AK (actinic keratosis) (4) Left Dorsal Hand x3, right dorsal hand x1  Hypertrophic AKs  Actinic keratoses are precancerous spots that appear secondary to cumulative UV radiation exposure/sun exposure over time. They are chronic with expected duration over 1 year.  A portion of actinic keratoses will progress to squamous cell carcinoma of the skin. It is not possible to reliably predict which spots will progress to skin cancer and so treatment is recommended to prevent development of skin cancer.  Recommend daily broad spectrum sunscreen  SPF 30+ to sun-exposed areas, reapply every 2 hours as needed.  Recommend staying in the shade or wearing long sleeves, sun glasses (UVA+UVB protection) and wide brim hats (4-inch brim around the entire circumference of the hat). Call for new or changing lesions.  Defer treatment of thinner Aks and will monitor for changes.  Prior to procedure, discussed risks of blister formation, small wound, skin dyspigmentation, or rare scar following cryotherapy. Recommend Vaseline ointment to treated areas while healing.   Destruction of lesion - Left Dorsal Hand x3, right dorsal hand x1  Destruction method: cryotherapy   Informed consent: discussed and consent obtained   Lesion destroyed using liquid nitrogen: Yes   Outcome: patient tolerated procedure well with no complications   Post-procedure details: wound care instructions given     Actinic Damage - chronic, secondary to cumulative UV radiation exposure/sun exposure over time - diffuse scaly erythematous macules with underlying dyspigmentation - Recommend daily broad spectrum sunscreen SPF 30+ to sun-exposed areas, reapply every 2 hours as needed.  - Recommend staying in the shade or wearing long sleeves, sun glasses (UVA+UVB protection) and wide brim hats (4-inch brim around the entire circumference of the hat). - Call for new or changing lesions.  Seborrheic Keratoses - Stuck-on, waxy, tan-brown papules and/or plaques  - Benign-appearing - Discussed benign etiology and prognosis. - Observe - Call for any changes  Lentigines - Scattered tan macules - Due to sun exposure - Benign-appering, observe - Recommend daily broad spectrum sunscreen SPF 30+ to sun-exposed areas, reapply every 2 hours as needed. - Call for any changes   Return for AK Follow Up 3-4 months.  I, Emelia Salisbury, CMA, am acting as scribe for Forest Gleason, MD.  Documentation: I have reviewed the above documentation for accuracy and completeness, and I agree with  the above.  Forest Gleason, MD

## 2021-05-19 DIAGNOSIS — E119 Type 2 diabetes mellitus without complications: Secondary | ICD-10-CM | POA: Diagnosis not present

## 2021-05-19 DIAGNOSIS — I10 Essential (primary) hypertension: Secondary | ICD-10-CM | POA: Diagnosis not present

## 2021-05-19 DIAGNOSIS — E785 Hyperlipidemia, unspecified: Secondary | ICD-10-CM | POA: Diagnosis not present

## 2021-05-19 DIAGNOSIS — E039 Hypothyroidism, unspecified: Secondary | ICD-10-CM | POA: Diagnosis not present

## 2021-05-20 ENCOUNTER — Encounter: Payer: Self-pay | Admitting: Dermatology

## 2021-05-23 DIAGNOSIS — M5459 Other low back pain: Secondary | ICD-10-CM | POA: Diagnosis not present

## 2021-05-23 DIAGNOSIS — E1122 Type 2 diabetes mellitus with diabetic chronic kidney disease: Secondary | ICD-10-CM | POA: Diagnosis not present

## 2021-05-23 DIAGNOSIS — N1831 Chronic kidney disease, stage 3a: Secondary | ICD-10-CM | POA: Diagnosis not present

## 2021-05-23 DIAGNOSIS — K5901 Slow transit constipation: Secondary | ICD-10-CM | POA: Diagnosis not present

## 2021-05-23 DIAGNOSIS — I129 Hypertensive chronic kidney disease with stage 1 through stage 4 chronic kidney disease, or unspecified chronic kidney disease: Secondary | ICD-10-CM | POA: Diagnosis not present

## 2021-05-24 ENCOUNTER — Telehealth: Payer: Self-pay

## 2021-05-24 ENCOUNTER — Other Ambulatory Visit: Payer: Self-pay

## 2021-05-24 DIAGNOSIS — C4491 Basal cell carcinoma of skin, unspecified: Secondary | ICD-10-CM

## 2021-05-24 NOTE — Telephone Encounter (Signed)
Spoke with patient's daughter and scheduled patient for EDC x 2 on 2/21 at 10:30 am. Patient's referral sent to Radiation Oncology.Cherly Hensen

## 2021-05-24 NOTE — Progress Notes (Signed)
Error

## 2021-05-30 ENCOUNTER — Ambulatory Visit: Payer: Medicare Other | Admitting: Radiation Oncology

## 2021-05-31 DIAGNOSIS — M25551 Pain in right hip: Secondary | ICD-10-CM | POA: Diagnosis not present

## 2021-05-31 DIAGNOSIS — D485 Neoplasm of uncertain behavior of skin: Secondary | ICD-10-CM | POA: Diagnosis not present

## 2021-05-31 DIAGNOSIS — M159 Polyosteoarthritis, unspecified: Secondary | ICD-10-CM | POA: Diagnosis not present

## 2021-05-31 DIAGNOSIS — D72829 Elevated white blood cell count, unspecified: Secondary | ICD-10-CM | POA: Diagnosis not present

## 2021-05-31 DIAGNOSIS — E1122 Type 2 diabetes mellitus with diabetic chronic kidney disease: Secondary | ICD-10-CM | POA: Diagnosis not present

## 2021-05-31 DIAGNOSIS — N3000 Acute cystitis without hematuria: Secondary | ICD-10-CM | POA: Diagnosis not present

## 2021-05-31 DIAGNOSIS — M810 Age-related osteoporosis without current pathological fracture: Secondary | ICD-10-CM | POA: Diagnosis not present

## 2021-05-31 DIAGNOSIS — Z79891 Long term (current) use of opiate analgesic: Secondary | ICD-10-CM | POA: Diagnosis not present

## 2021-05-31 DIAGNOSIS — M542 Cervicalgia: Secondary | ICD-10-CM | POA: Diagnosis not present

## 2021-05-31 DIAGNOSIS — Z483 Aftercare following surgery for neoplasm: Secondary | ICD-10-CM | POA: Diagnosis not present

## 2021-05-31 DIAGNOSIS — Z7409 Other reduced mobility: Secondary | ICD-10-CM | POA: Diagnosis not present

## 2021-05-31 DIAGNOSIS — Z993 Dependence on wheelchair: Secondary | ICD-10-CM | POA: Diagnosis not present

## 2021-05-31 DIAGNOSIS — H5789 Other specified disorders of eye and adnexa: Secondary | ICD-10-CM | POA: Diagnosis not present

## 2021-05-31 DIAGNOSIS — G8929 Other chronic pain: Secondary | ICD-10-CM | POA: Diagnosis not present

## 2021-05-31 DIAGNOSIS — L219 Seborrheic dermatitis, unspecified: Secondary | ICD-10-CM | POA: Diagnosis not present

## 2021-05-31 DIAGNOSIS — L570 Actinic keratosis: Secondary | ICD-10-CM | POA: Diagnosis not present

## 2021-05-31 DIAGNOSIS — I129 Hypertensive chronic kidney disease with stage 1 through stage 4 chronic kidney disease, or unspecified chronic kidney disease: Secondary | ICD-10-CM | POA: Diagnosis not present

## 2021-05-31 DIAGNOSIS — Z87891 Personal history of nicotine dependence: Secondary | ICD-10-CM | POA: Diagnosis not present

## 2021-05-31 DIAGNOSIS — B353 Tinea pedis: Secondary | ICD-10-CM | POA: Diagnosis not present

## 2021-05-31 DIAGNOSIS — Z4801 Encounter for change or removal of surgical wound dressing: Secondary | ICD-10-CM | POA: Diagnosis not present

## 2021-05-31 DIAGNOSIS — N3281 Overactive bladder: Secondary | ICD-10-CM | POA: Diagnosis not present

## 2021-05-31 DIAGNOSIS — Z7982 Long term (current) use of aspirin: Secondary | ICD-10-CM | POA: Diagnosis not present

## 2021-05-31 DIAGNOSIS — R4189 Other symptoms and signs involving cognitive functions and awareness: Secondary | ICD-10-CM | POA: Diagnosis not present

## 2021-05-31 DIAGNOSIS — Z9181 History of falling: Secondary | ICD-10-CM | POA: Diagnosis not present

## 2021-05-31 DIAGNOSIS — N189 Chronic kidney disease, unspecified: Secondary | ICD-10-CM | POA: Diagnosis not present

## 2021-06-01 ENCOUNTER — Ambulatory Visit
Admission: RE | Admit: 2021-06-01 | Discharge: 2021-06-01 | Disposition: A | Discharge status: Home / Self Care | Payer: Medicare Other | Source: Ambulatory Visit | Attending: Radiation Oncology | Admitting: Radiation Oncology

## 2021-06-01 ENCOUNTER — Encounter: Payer: Self-pay | Admitting: Radiation Oncology

## 2021-06-01 ENCOUNTER — Other Ambulatory Visit: Payer: Self-pay

## 2021-06-01 ENCOUNTER — Other Ambulatory Visit: Payer: Self-pay | Admitting: *Deleted

## 2021-06-01 VITALS — BP 136/75 | HR 85 | Temp 97.2°F | Resp 20 | Wt 128.0 lb

## 2021-06-01 DIAGNOSIS — C44301 Unspecified malignant neoplasm of skin of nose: Secondary | ICD-10-CM | POA: Insufficient documentation

## 2021-06-01 DIAGNOSIS — C4490 Unspecified malignant neoplasm of skin, unspecified: Secondary | ICD-10-CM

## 2021-06-01 NOTE — Progress Notes (Signed)
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  Both patient who is 72 and her daughter live in [place] I am referring her to the cancer center at [place] [facility] for treatment.

## 2021-06-02 ENCOUNTER — Telehealth: Payer: Self-pay | Admitting: Radiation Oncology

## 2021-06-02 DIAGNOSIS — N189 Chronic kidney disease, unspecified: Secondary | ICD-10-CM | POA: Diagnosis not present

## 2021-06-02 DIAGNOSIS — Z4801 Encounter for change or removal of surgical wound dressing: Secondary | ICD-10-CM | POA: Diagnosis not present

## 2021-06-02 DIAGNOSIS — Z483 Aftercare following surgery for neoplasm: Secondary | ICD-10-CM | POA: Diagnosis not present

## 2021-06-02 DIAGNOSIS — I129 Hypertensive chronic kidney disease with stage 1 through stage 4 chronic kidney disease, or unspecified chronic kidney disease: Secondary | ICD-10-CM | POA: Diagnosis not present

## 2021-06-02 DIAGNOSIS — E1122 Type 2 diabetes mellitus with diabetic chronic kidney disease: Secondary | ICD-10-CM | POA: Diagnosis not present

## 2021-06-02 DIAGNOSIS — D485 Neoplasm of uncertain behavior of skin: Secondary | ICD-10-CM | POA: Diagnosis not present

## 2021-06-02 NOTE — Telephone Encounter (Signed)
Left  voicemail 2/9 @ 10:23 am with patient's daughter for patient to call our office.

## 2021-06-06 DIAGNOSIS — K5901 Slow transit constipation: Secondary | ICD-10-CM | POA: Diagnosis not present

## 2021-06-06 DIAGNOSIS — Z4801 Encounter for change or removal of surgical wound dressing: Secondary | ICD-10-CM | POA: Diagnosis not present

## 2021-06-06 DIAGNOSIS — N189 Chronic kidney disease, unspecified: Secondary | ICD-10-CM | POA: Diagnosis not present

## 2021-06-06 DIAGNOSIS — E1122 Type 2 diabetes mellitus with diabetic chronic kidney disease: Secondary | ICD-10-CM | POA: Diagnosis not present

## 2021-06-06 DIAGNOSIS — I129 Hypertensive chronic kidney disease with stage 1 through stage 4 chronic kidney disease, or unspecified chronic kidney disease: Secondary | ICD-10-CM | POA: Diagnosis not present

## 2021-06-06 DIAGNOSIS — N1831 Chronic kidney disease, stage 3a: Secondary | ICD-10-CM | POA: Diagnosis not present

## 2021-06-06 DIAGNOSIS — D485 Neoplasm of uncertain behavior of skin: Secondary | ICD-10-CM | POA: Diagnosis not present

## 2021-06-06 DIAGNOSIS — Z483 Aftercare following surgery for neoplasm: Secondary | ICD-10-CM | POA: Diagnosis not present

## 2021-06-07 ENCOUNTER — Ambulatory Visit: Payer: Medicare Other | Admitting: Radiation Oncology

## 2021-06-07 ENCOUNTER — Ambulatory Visit: Payer: Medicare Other

## 2021-06-09 DIAGNOSIS — D485 Neoplasm of uncertain behavior of skin: Secondary | ICD-10-CM | POA: Diagnosis not present

## 2021-06-09 DIAGNOSIS — Z4801 Encounter for change or removal of surgical wound dressing: Secondary | ICD-10-CM | POA: Diagnosis not present

## 2021-06-09 DIAGNOSIS — E1122 Type 2 diabetes mellitus with diabetic chronic kidney disease: Secondary | ICD-10-CM | POA: Diagnosis not present

## 2021-06-09 DIAGNOSIS — Z483 Aftercare following surgery for neoplasm: Secondary | ICD-10-CM | POA: Diagnosis not present

## 2021-06-09 DIAGNOSIS — I129 Hypertensive chronic kidney disease with stage 1 through stage 4 chronic kidney disease, or unspecified chronic kidney disease: Secondary | ICD-10-CM | POA: Diagnosis not present

## 2021-06-09 DIAGNOSIS — N189 Chronic kidney disease, unspecified: Secondary | ICD-10-CM | POA: Diagnosis not present

## 2021-06-09 NOTE — Progress Notes (Signed)
Oncology Nurse Navigator Documentation   Placed introductory call to new referral patient Amberlee Garvey. I spoke with her daughter, [name].  Introduced myself as the H&N oncology nurse navigator that works with Dr. Isidore Moos to whom she has been referred by Dr. Donella Stade. She confirmed understanding of referral. Briefly explained my role as her navigator, provided my contact information.  Confirmed understanding of upcoming appts and [place] location, explained arrival and registration process.   I encouraged her to call with questions/concerns as she moves forward with appts and procedures.   She verbalized understanding of information provided, expressed appreciation for my call.   Navigator Initial Assessment Employment Status:she is retired.  Currently on FMLA / STD:no Living Situation:She lives at [facility] [place] [address].  Support System:daughters, family PCP:Amy Bedsole MD PCD: na Financial Concerns:na Transportation Needs: no Sensory Deficits:na Language Barriers/Interpreter Needed:  no Ambulation Needs: no DME Used in Home: no Psychosocial Needs:  no Concerns/Needs Understanding Cancer:  addressed/answered by navigator to best of ability Self-Expressed Needs: no   Clinical biochemist, BSN, OCN Head & Neck Oncology Nurse [place] [facility] Phone # [phone]  Fax # [phone]

## 2021-06-10 DIAGNOSIS — Z4801 Encounter for change or removal of surgical wound dressing: Secondary | ICD-10-CM | POA: Diagnosis not present

## 2021-06-10 DIAGNOSIS — N189 Chronic kidney disease, unspecified: Secondary | ICD-10-CM | POA: Diagnosis not present

## 2021-06-10 DIAGNOSIS — D485 Neoplasm of uncertain behavior of skin: Secondary | ICD-10-CM | POA: Diagnosis not present

## 2021-06-10 DIAGNOSIS — Z483 Aftercare following surgery for neoplasm: Secondary | ICD-10-CM | POA: Diagnosis not present

## 2021-06-10 DIAGNOSIS — I129 Hypertensive chronic kidney disease with stage 1 through stage 4 chronic kidney disease, or unspecified chronic kidney disease: Secondary | ICD-10-CM | POA: Diagnosis not present

## 2021-06-10 DIAGNOSIS — E1122 Type 2 diabetes mellitus with diabetic chronic kidney disease: Secondary | ICD-10-CM | POA: Diagnosis not present

## 2021-06-13 ENCOUNTER — Ambulatory Visit (INDEPENDENT_AMBULATORY_CARE_PROVIDER_SITE_OTHER): Payer: Medicare Other | Admitting: Podiatry

## 2021-06-13 ENCOUNTER — Other Ambulatory Visit: Payer: Self-pay

## 2021-06-13 ENCOUNTER — Telehealth: Payer: Self-pay

## 2021-06-13 ENCOUNTER — Ambulatory Visit: Payer: Medicare Other

## 2021-06-13 DIAGNOSIS — M79675 Pain in left toe(s): Secondary | ICD-10-CM

## 2021-06-13 DIAGNOSIS — B351 Tinea unguium: Secondary | ICD-10-CM | POA: Diagnosis not present

## 2021-06-13 DIAGNOSIS — N189 Chronic kidney disease, unspecified: Secondary | ICD-10-CM | POA: Diagnosis not present

## 2021-06-13 DIAGNOSIS — M2041 Other hammer toe(s) (acquired), right foot: Secondary | ICD-10-CM

## 2021-06-13 DIAGNOSIS — M2042 Other hammer toe(s) (acquired), left foot: Secondary | ICD-10-CM

## 2021-06-13 DIAGNOSIS — Z4801 Encounter for change or removal of surgical wound dressing: Secondary | ICD-10-CM | POA: Diagnosis not present

## 2021-06-13 DIAGNOSIS — I129 Hypertensive chronic kidney disease with stage 1 through stage 4 chronic kidney disease, or unspecified chronic kidney disease: Secondary | ICD-10-CM | POA: Diagnosis not present

## 2021-06-13 DIAGNOSIS — E1122 Type 2 diabetes mellitus with diabetic chronic kidney disease: Secondary | ICD-10-CM | POA: Diagnosis not present

## 2021-06-13 DIAGNOSIS — E1142 Type 2 diabetes mellitus with diabetic polyneuropathy: Secondary | ICD-10-CM

## 2021-06-13 DIAGNOSIS — Z483 Aftercare following surgery for neoplasm: Secondary | ICD-10-CM | POA: Diagnosis not present

## 2021-06-13 DIAGNOSIS — E119 Type 2 diabetes mellitus without complications: Secondary | ICD-10-CM

## 2021-06-13 DIAGNOSIS — M79674 Pain in right toe(s): Secondary | ICD-10-CM

## 2021-06-13 DIAGNOSIS — D485 Neoplasm of uncertain behavior of skin: Secondary | ICD-10-CM | POA: Diagnosis not present

## 2021-06-13 DIAGNOSIS — L84 Corns and callosities: Secondary | ICD-10-CM

## 2021-06-13 NOTE — Telephone Encounter (Signed)
Casts Sent to [employer] - HOLD for CMN °

## 2021-06-13 NOTE — Patient Instructions (Signed)
For the right 3rd toe, clean with soap and water. Apply a small amount of antibiotic ointment and bandage. Change daily.   Apply moisturizer to the feet daily, do not apply between the toes.   If you notice any increase in swelling, redness, drainage or any signs of infection please let me know. We an be reached at [phone].

## 2021-06-13 NOTE — Progress Notes (Signed)
SITUATION Reason for Consult: Evaluation for Prefabricated Diabetic Shoes and Bilateral Custom Diabetic Inserts. Patient / Caregiver Report: Patient would like well fitting shoes  OBJECTIVE DATA: Patient History / Diagnosis:    ICD-10-CM   1. Controlled type 2 diabetes mellitus without complication, without long-term current use of insulin (HCC)  E11.9     2. Acquired hammertoes of both feet  M20.41    M20.42       Current or Previous Devices:   Historical user  In-Person Foot Examination: Ulcers & Callousing:   Calluses historical  Toe / Foot Deformities:   - Hammertoes   Shoe Size: 8.20M  ORTHOTIC RECOMMENDATION Recommended Devices: - 1x pair prefabricated PDAC approved diabetic shoes: Patient selects Apex [id number] 8.20M - 3x pair custom-to-patient vacuum formed diabetic insoles.   GOALS OF SHOES AND INSOLES - Reduce shear and pressure - Reduce / Prevent callus formation - Reduce / Prevent ulceration - Protect the fragile healing compromised diabetic foot.  Patient would benefit from diabetic shoes and inserts as patient has diabetes mellitus and the patient has one or more of the following conditions: - History of partial or complete amputation of the foot - History of previous foot ulceration. - History of pre-ulcerative callus - Peripheral neuropathy with evidence of callus formation - Foot deformity - Poor circulation  ACTIONS PERFORMED Patient was casted for insoles via crush box and measured for shoes via brannock device. Procedure was explained and patient tolerated procedure well. All questions were answered and concerns addressed.  PLAN Patient is to ensure treating physician receives and completes diabetic paperwork. Casts and shoe order are to be held until paperwork is received. Once received patient is to be scheduled for fitting in four weeks.

## 2021-06-13 NOTE — Progress Notes (Signed)
Radiation Oncology         [phone] ________________________________  Initial Outpatient Consultation  Name: [name] MRN: [id number]  Date: 06/14/2021  DOB: [birth year]  CC:No primary care provider on file.  [name], MD   REFERRING PHYSICIAN: [name], MD  DIAGNOSIS:    ICD-10-CM   1. Basal cell carcinoma (BCC) of lateral side wall of nose  C44.311     2. Basal cell carcinoma of nose  C44.311       Basal cell carcinoma - right nasal lesion   Cancer Staging  Basal cell carcinoma of nose Staging form: Cutaneous Carcinoma of the Head and Neck, AJCC 8th Edition - Clinical stage from 06/14/2021: Stage I (cT1, cN0, cM0) - Signed by Eppie Gibson, MD on 06/14/2021 Extraosseous extension: Absent   CHIEF COMPLAINT: Here to discuss management of skin cancer  HISTORY OF PRESENT ILLNESS::[name] is a 88 y.o. female who presented with Dr. Laurence Ferrari, [place], on 05/18/21 for evaluation of lesions of the right nose and right lower leg. Biopsies were obtained during this visit which revealed basal cell carcinoma of the right nasal lesion, with involvement of the peripheral and deep margins. Biopsy of the right lower leg lesion revealed squamous cell carcinoma in-situ with peripheral margin involvement. Biopsy was also performed of a lesion on the left dorsal hand which revealed well differentiated squamous cell carcinoma with superficial infiltration.   Subsequently, the patient was referred to Dr. Baruch Gouty on 06/01/21 for further evaluation. Ultimately, Dr. Baruch Gouty recommended  electron beam therapy to the site of [facility] on the right nose. Given that the patient and her daughter live in [place], the patient was referred to be to receive treatment closer to home.   Dr. Laurence Ferrari, [place], on 05/18/21 for evaluation of lesions of the right nose and right lower leg.  Past skin cancers, if any: N/A--this is patient's first experience   History of  Blistering sunburns, if any: Reports history of sun exposure as a child   SAFETY ISSUES: Prior radiation? No Pacemaker/ICD? No Possible current pregnancy? No--hysterectomy Is the patient on methotrexate? No  Current Complaints / other details:  Patient resides at [place]: No  PAST MEDICAL HISTORY:  has a past medical history of Acute pharyngitis, Acute sinusitis, unspecified, Acute upper respiratory infections of unspecified site, Allergy, Anal and rectal polyp, BCC (basal cell carcinoma) (05/18/2021), Cataract, Cavernous angioma, Degenerative cervical disc (02/22/2006), Diabetes mellitus, GERD (gastroesophageal reflux disease), Gout, Hemangioma of unspecified site, Hyperlipidemia, Hypertension, Orthostatic hypotension (04/24/2006), Osteoarthritis, Osteopenia, Other abscess of vulva, Palpitations, SCC (squamous cell carcinoma) (05/18/2021), Squamous cell carcinoma in situ (SCCIS) (05/18/2021), and Syncope and collapse.    PAST SURGICAL HISTORY: Past Surgical History:  Procedure Laterality Date   ABDOMINAL HYSTERECTOMY     Aortic dopplers     Mild plaque.  EEG okay   CATARACT EXTRACTION     Cavernous angioma     Cavernous hemangioma  04/2004   Hospital   TOTAL HIP ARTHROPLASTY Right 11/01/2020   Procedure: TOTAL HIP ARTHROPLASTY ANTERIOR APPROACH;  Surgeon: Gaynelle Arabian, MD;  Location: [facility];  Service: Orthopedics;  Laterality: Right;   TOTAL KNEE ARTHROPLASTY     Right    FAMILY HISTORY: family history includes Cancer in her maternal aunt; Heart disease in an other family member.  SOCIAL HISTORY:  reports that she has quit smoking. She has never used smokeless tobacco. She reports that she does not  currently use alcohol. She reports that she does not use drugs.  ALLERGIES: Codeine, Other, and Statins  MEDICATIONS:  Current Outpatient Medications  Medication Sig Dispense Refill   Accu-Chek Softclix Lancets lancets CHECK BLOOD SUGAR 1 TIME A  DAY. DX: E11.9 100 each 3   Blood Glucose Monitoring Suppl (ACCU-CHEK GUIDE) w/Device KIT 1 each by Does not apply route daily. Use to check blood sugar daily as needed.  Dx: E11.9 1 kit 0   fluticasone (FLONASE) 50 MCG/ACT nasal spray PLACE 2 SPRAYS INTO BOTH NOSTRILS DAILY AS NEEDED FOR ALLERGIES. 48 mL 1   glucose blood (ACCU-CHEK GUIDE) test strip Use to check blood sugar 1 time daily 100 each 3   HYDROcodone-acetaminophen (NORCO/VICODIN) 5-325 MG tablet Take 1 tablet by mouth every 6 (six) hours as needed for severe pain. 30 tablet 0   mupirocin ointment (BACTROBAN) 2 % Apply twice daily to affected areas until healed 22 g 1   MYRBETRIQ 25 MG TB24 tablet Take 25 mg by mouth daily.     Propylene Glycol (SYSTANE BALANCE) 0.6 % SOLN Place 1-2 drops into both eyes daily.     ramipril (ALTACE) 5 MG capsule TAKE 1 CAPSULE BY MOUTH EVERY DAY 90 capsule 1   TYLENOL 500 MG tablet Take 1,000 mg by mouth 2 (two) times daily.     No current facility-administered medications for this encounter.    REVIEW OF SYSTEMS:  Notable for that above.   PHYSICAL EXAM:  height is 5' 3"  (1.6 m) and weight is 131 lb 2 oz (59.5 kg). Her temporal temperature is 97.4 F (36.3 C) (abnormal). Her blood pressure is 132/64 and her pulse is 92. Her respiration is 18 and oxygen saturation is 98%.   General: Alert and conversant , in no acute distress   HEENT: Head is normocephalic. Extraocular movements are intact. Oropharynx is clear. Neck: Neck is supple, no palpable facial, cervical or supraclavicular lymphadenopathy. Skin: crusted/scabbed lesion on right lateral upper nose <2cm Neurologic: Cranial nerves II through XII are grossly intact. No obvious focalities. Speech is fluent.  Psychiatric: Judgment and insight are intact. Affect is appropriate.   ECOG = 3  0 - Asymptomatic (Fully active, able to carry on all predisease activities without restriction)  1 - Symptomatic but completely ambulatory (Restricted in  physically strenuous activity but ambulatory and able to carry out work of a light or sedentary nature. For example, light housework, office work)  2 - Symptomatic, <50% in bed during the day (Ambulatory and capable of all self care but unable to carry out any work activities. Up and about more than 50% of waking hours)  3 - Symptomatic, >50% in bed, but not bedbound (Capable of only limited self-care, confined to bed or chair 50% or more of waking hours)  4 - Bedbound (Completely disabled. Cannot carry on any self-care. Totally confined to bed or chair)  5 - Death   Eustace Pen MM, Creech RH, Tormey DC, et al. [phone]). "Toxicity and response criteria of the [facility] [employer] [facility]". [place] Oncol. 5 (6): 649-55   LABORATORY DATA:  Lab Results  Component Value Date   WBC 19.1 (H) 11/03/2020   HGB 11.2 (L) 11/03/2020   HCT 34.8 (L) 11/03/2020   MCV 97.8 11/03/2020   PLT 256 11/03/2020   CMP     Component Value Date/Time   NA 134 (L) 11/03/2020 0318   K 4.5 11/03/2020 0318   CL 103 11/03/2020 0318   CO2 24  11/03/2020 0318   GLUCOSE 138 (H) 11/03/2020 0318   BUN 34 (H) 11/03/2020 0318   CREATININE 0.87 11/03/2020 0318   CREATININE 1.10 (H) 05/10/2018 1639   CALCIUM 8.9 11/03/2020 0318   PROT 6.7 10/29/2020 1037   ALBUMIN 3.6 10/29/2020 1037   AST 28 10/29/2020 1037   ALT 24 10/29/2020 1037   ALKPHOS 55 10/29/2020 1037   BILITOT 0.9 10/29/2020 1037   GFRNONAA >60 11/03/2020 0318   GFRAA >60 07/23/2019 1253         RADIOGRAPHY: No results found.    IMPRESSION/PLAN: Basal cell skin cancer of nose  Today, I talked to the patient and her daughter about the findings and work-up thus far.  We discussed the patient's diagnosis of skin cancer and general treatment for this, highlighting the role of radiotherapy in the management.  We discussed the available radiation techniques, and focused on the details of logistics and delivery.   We reviewed various fractionation  schemes. Given her age and relative fragility, I think that 44Gy in 10 fractions given twice weekly will be best tolerated by her with an excellent chance of curing her cancer. We discussed pros and cons of this treatment.   We discussed the risks, benefits, and side effects of radiotherapy. Side effects may include but not necessarily be limited to: fatigue, skin irritation, nostril/nasal passage irritation, eye watering, eye dryness, rare cartilage/bone damage.  No guarantees of treatment were given. A consent form was signed and placed in the patient's medical record.  The patient was encouraged to ask questions that I answered to the best of my ability.  She and her daughter are enthusiastic to proceed with simulation today, and start RT to her nose next week. She will pursue treatments to her extremity skin cancers with her dermatologist.   On date of service, in total, I spent 55 minutes on this encounter. Patient was seen in person.   __________________________________________   Eppie Gibson, MD  This document serves as a record of services personally performed by Eppie Gibson, MD. It was created on her behalf by Roney Mans, a trained medical scribe. The creation of this record is based on the scribe's personal observations and the provider's statements to them. This document has been checked and approved by the attending provider.

## 2021-06-13 NOTE — Progress Notes (Signed)
Histology and Location of Primary Skin Cancer:  Skin , right nasal sidewall BASAL CELL CARCINOMA, NODULAR AND INFILTRATIVE PATTERNS, PERIPHERAL AND DEEP MARGINS INVOLVED 2.   Skin , right lower leg-medial SQUAMOUS CELL CARCINOMA IN SITU, PERIPHERAL MARGIN INVOLVED 3.  Skin , left dorsal hand over MCP WELL DIFFERENTIATED SQUAMOUS CELL CARCINOMA WITH SUPERFICIAL INFILTRATION, BASE INVOLVED  Past/Anticipated interventions by patient's surgeon/dermatologist for current problematic lesion, if any:  Dr. Laurence Ferrari, [place], on 05/18/21 for evaluation of lesions of the right nose and right lower leg.  Past skin cancers, if any: N/A--this is patient's first experience   History of Blistering sunburns, if any: Reports history of sun exposure as a child   SAFETY ISSUES: Prior radiation? No Pacemaker/ICD? No Possible current pregnancy? No--hysterectomy Is the patient on methotrexate? No  Current Complaints / other details:  Patient resides at [facility]

## 2021-06-14 ENCOUNTER — Ambulatory Visit
Admission: RE | Admit: 2021-06-14 | Discharge: 2021-06-14 | Disposition: A | Discharge status: Home / Self Care | Payer: Medicare Other | Source: Ambulatory Visit | Attending: Radiation Oncology | Admitting: Radiation Oncology

## 2021-06-14 ENCOUNTER — Ambulatory Visit: Admission: RE | Admit: 2021-06-14 | Payer: Medicare Other | Source: Ambulatory Visit | Admitting: Radiation Oncology

## 2021-06-14 ENCOUNTER — Encounter: Payer: Self-pay | Admitting: Radiation Oncology

## 2021-06-14 ENCOUNTER — Ambulatory Visit: Payer: Medicare Other | Admitting: Dermatology

## 2021-06-14 VITALS — BP 132/64 | HR 92 | Temp 97.4°F | Resp 18 | Ht 63.0 in | Wt 131.1 lb

## 2021-06-14 DIAGNOSIS — M8580 Other specified disorders of bone density and structure, unspecified site: Secondary | ICD-10-CM | POA: Diagnosis not present

## 2021-06-14 DIAGNOSIS — Z510 Encounter for antineoplastic radiation therapy: Secondary | ICD-10-CM | POA: Diagnosis not present

## 2021-06-14 DIAGNOSIS — I10 Essential (primary) hypertension: Secondary | ICD-10-CM | POA: Diagnosis not present

## 2021-06-14 DIAGNOSIS — C44311 Basal cell carcinoma of skin of nose: Secondary | ICD-10-CM | POA: Diagnosis not present

## 2021-06-14 DIAGNOSIS — Z86010 Personal history of colonic polyps: Secondary | ICD-10-CM | POA: Diagnosis not present

## 2021-06-14 DIAGNOSIS — C44301 Unspecified malignant neoplasm of skin of nose: Secondary | ICD-10-CM | POA: Insufficient documentation

## 2021-06-14 DIAGNOSIS — M1990 Unspecified osteoarthritis, unspecified site: Secondary | ICD-10-CM | POA: Diagnosis not present

## 2021-06-14 DIAGNOSIS — Z85828 Personal history of other malignant neoplasm of skin: Secondary | ICD-10-CM | POA: Insufficient documentation

## 2021-06-14 DIAGNOSIS — K219 Gastro-esophageal reflux disease without esophagitis: Secondary | ICD-10-CM | POA: Insufficient documentation

## 2021-06-14 DIAGNOSIS — M5030 Other cervical disc degeneration, unspecified cervical region: Secondary | ICD-10-CM | POA: Insufficient documentation

## 2021-06-14 DIAGNOSIS — Z809 Family history of malignant neoplasm, unspecified: Secondary | ICD-10-CM | POA: Diagnosis not present

## 2021-06-14 DIAGNOSIS — Z87891 Personal history of nicotine dependence: Secondary | ICD-10-CM | POA: Insufficient documentation

## 2021-06-14 DIAGNOSIS — E785 Hyperlipidemia, unspecified: Secondary | ICD-10-CM | POA: Diagnosis not present

## 2021-06-14 NOTE — Progress Notes (Signed)
Oncology Nurse Navigator Documentation   Met with patient during initial consult with Dr. Isidore Moos. She was accompanied by her daughter.  Further introduced myself as her/their Navigator, explained my role as a member of the Care Team. Assisted with post-consult appt scheduling. They verbalized understanding of information provided. I encouraged them to call with questions/concerns moving forward.  Harlow Asa, RN, BSN, OCN Head & Neck Oncology Nurse [place] at [place] [phone]

## 2021-06-15 DIAGNOSIS — Z4801 Encounter for change or removal of surgical wound dressing: Secondary | ICD-10-CM | POA: Diagnosis not present

## 2021-06-15 DIAGNOSIS — M1712 Unilateral primary osteoarthritis, left knee: Secondary | ICD-10-CM | POA: Diagnosis not present

## 2021-06-15 DIAGNOSIS — Z483 Aftercare following surgery for neoplasm: Secondary | ICD-10-CM | POA: Diagnosis not present

## 2021-06-15 DIAGNOSIS — I129 Hypertensive chronic kidney disease with stage 1 through stage 4 chronic kidney disease, or unspecified chronic kidney disease: Secondary | ICD-10-CM | POA: Diagnosis not present

## 2021-06-15 DIAGNOSIS — E1122 Type 2 diabetes mellitus with diabetic chronic kidney disease: Secondary | ICD-10-CM | POA: Diagnosis not present

## 2021-06-15 DIAGNOSIS — D485 Neoplasm of uncertain behavior of skin: Secondary | ICD-10-CM | POA: Diagnosis not present

## 2021-06-15 DIAGNOSIS — N189 Chronic kidney disease, unspecified: Secondary | ICD-10-CM | POA: Diagnosis not present

## 2021-06-15 DIAGNOSIS — M19012 Primary osteoarthritis, left shoulder: Secondary | ICD-10-CM | POA: Diagnosis not present

## 2021-06-16 DIAGNOSIS — I129 Hypertensive chronic kidney disease with stage 1 through stage 4 chronic kidney disease, or unspecified chronic kidney disease: Secondary | ICD-10-CM | POA: Diagnosis not present

## 2021-06-16 DIAGNOSIS — D485 Neoplasm of uncertain behavior of skin: Secondary | ICD-10-CM | POA: Diagnosis not present

## 2021-06-16 DIAGNOSIS — E1122 Type 2 diabetes mellitus with diabetic chronic kidney disease: Secondary | ICD-10-CM | POA: Diagnosis not present

## 2021-06-16 DIAGNOSIS — Z4801 Encounter for change or removal of surgical wound dressing: Secondary | ICD-10-CM | POA: Diagnosis not present

## 2021-06-16 DIAGNOSIS — Z483 Aftercare following surgery for neoplasm: Secondary | ICD-10-CM | POA: Diagnosis not present

## 2021-06-16 DIAGNOSIS — N189 Chronic kidney disease, unspecified: Secondary | ICD-10-CM | POA: Diagnosis not present

## 2021-06-17 DIAGNOSIS — Z483 Aftercare following surgery for neoplasm: Secondary | ICD-10-CM | POA: Diagnosis not present

## 2021-06-17 DIAGNOSIS — E1122 Type 2 diabetes mellitus with diabetic chronic kidney disease: Secondary | ICD-10-CM | POA: Diagnosis not present

## 2021-06-17 DIAGNOSIS — D485 Neoplasm of uncertain behavior of skin: Secondary | ICD-10-CM | POA: Diagnosis not present

## 2021-06-17 DIAGNOSIS — N189 Chronic kidney disease, unspecified: Secondary | ICD-10-CM | POA: Diagnosis not present

## 2021-06-17 DIAGNOSIS — I129 Hypertensive chronic kidney disease with stage 1 through stage 4 chronic kidney disease, or unspecified chronic kidney disease: Secondary | ICD-10-CM | POA: Diagnosis not present

## 2021-06-17 DIAGNOSIS — Z4801 Encounter for change or removal of surgical wound dressing: Secondary | ICD-10-CM | POA: Diagnosis not present

## 2021-06-18 NOTE — Progress Notes (Signed)
Subjective: 88 year old female presents the office today for concerns of thick, discolored, elongated toenails that she cannot trim her self as well as for callus.  She does not see any swelling or redness or any drainage or any open sores.  Also asked about diabetic shoes.  She has not taken her blood sugar in a while.  No other concerns.  Objective: AAO x3, NAD DP/PT pulses palpable bilaterally, CRT less than 3 seconds Nails are hypertrophic, dystrophic, brittle, discolored, elongated 10. No surrounding redness or drainage. Tenderness nails 1-5 bilaterally. No open lesions or pre-ulcerative lesions are identified today. Hyperkeratotic lesion right hallux.  No underlying ulceration drainage or signs of infection. No pain with calf compression, swelling, warmth, erythema  Assessment: 88 year old female with symptomatic onychosis, hyperkeratotic lesion  Plan: -All treatment options discussed with the patient including all alternatives, risks, complications.  -Jublia for the nails x10.  There is a small amount of bleeding noted along the right third toe.  There is no laceration present but from where the nail was growing into the skin some amount of bleeding noted.  I cleansed the wound with antibiotic ointment and a bandage applied.  Recommended daily dressing changes and monitor for any signs or symptoms of infection. -Follow up with Aaron Edelman, orthotist, for diabetic shoes.  -Debrided the hyperkeratotic lesion without any complications or bleeding. -Patient encouraged to call the office with any questions, concerns, change in symptoms.   Trula Slade DPM

## 2021-06-20 DIAGNOSIS — D485 Neoplasm of uncertain behavior of skin: Secondary | ICD-10-CM | POA: Diagnosis not present

## 2021-06-20 DIAGNOSIS — Z4801 Encounter for change or removal of surgical wound dressing: Secondary | ICD-10-CM | POA: Diagnosis not present

## 2021-06-20 DIAGNOSIS — N189 Chronic kidney disease, unspecified: Secondary | ICD-10-CM | POA: Diagnosis not present

## 2021-06-20 DIAGNOSIS — Z510 Encounter for antineoplastic radiation therapy: Secondary | ICD-10-CM | POA: Diagnosis not present

## 2021-06-20 DIAGNOSIS — C44311 Basal cell carcinoma of skin of nose: Secondary | ICD-10-CM | POA: Diagnosis not present

## 2021-06-20 DIAGNOSIS — I129 Hypertensive chronic kidney disease with stage 1 through stage 4 chronic kidney disease, or unspecified chronic kidney disease: Secondary | ICD-10-CM | POA: Diagnosis not present

## 2021-06-20 DIAGNOSIS — Z483 Aftercare following surgery for neoplasm: Secondary | ICD-10-CM | POA: Diagnosis not present

## 2021-06-20 DIAGNOSIS — E1122 Type 2 diabetes mellitus with diabetic chronic kidney disease: Secondary | ICD-10-CM | POA: Diagnosis not present

## 2021-06-21 ENCOUNTER — Ambulatory Visit: Admission: RE | Admit: 2021-06-21 | Payer: Medicare Other | Source: Ambulatory Visit | Admitting: Radiation Oncology

## 2021-06-21 ENCOUNTER — Ambulatory Visit
Admission: RE | Admit: 2021-06-21 | Discharge: 2021-06-21 | Disposition: A | Discharge status: Home / Self Care | Payer: Medicare Other | Source: Ambulatory Visit | Attending: Radiation Oncology | Admitting: Radiation Oncology

## 2021-06-21 ENCOUNTER — Other Ambulatory Visit: Payer: Self-pay

## 2021-06-21 DIAGNOSIS — C44311 Basal cell carcinoma of skin of nose: Secondary | ICD-10-CM | POA: Diagnosis not present

## 2021-06-21 DIAGNOSIS — Z510 Encounter for antineoplastic radiation therapy: Secondary | ICD-10-CM | POA: Diagnosis not present

## 2021-06-22 ENCOUNTER — Ambulatory Visit (INDEPENDENT_AMBULATORY_CARE_PROVIDER_SITE_OTHER): Payer: Medicare Other | Admitting: Dermatology

## 2021-06-22 ENCOUNTER — Ambulatory Visit: Payer: Medicare Other

## 2021-06-22 DIAGNOSIS — Z483 Aftercare following surgery for neoplasm: Secondary | ICD-10-CM | POA: Diagnosis not present

## 2021-06-22 DIAGNOSIS — D0471 Carcinoma in situ of skin of right lower limb, including hip: Secondary | ICD-10-CM | POA: Diagnosis not present

## 2021-06-22 DIAGNOSIS — Z4801 Encounter for change or removal of surgical wound dressing: Secondary | ICD-10-CM | POA: Diagnosis not present

## 2021-06-22 DIAGNOSIS — C44629 Squamous cell carcinoma of skin of left upper limb, including shoulder: Secondary | ICD-10-CM

## 2021-06-22 DIAGNOSIS — N189 Chronic kidney disease, unspecified: Secondary | ICD-10-CM | POA: Diagnosis not present

## 2021-06-22 DIAGNOSIS — D485 Neoplasm of uncertain behavior of skin: Secondary | ICD-10-CM | POA: Diagnosis not present

## 2021-06-22 DIAGNOSIS — C4492 Squamous cell carcinoma of skin, unspecified: Secondary | ICD-10-CM

## 2021-06-22 DIAGNOSIS — I129 Hypertensive chronic kidney disease with stage 1 through stage 4 chronic kidney disease, or unspecified chronic kidney disease: Secondary | ICD-10-CM | POA: Diagnosis not present

## 2021-06-22 DIAGNOSIS — E1122 Type 2 diabetes mellitus with diabetic chronic kidney disease: Secondary | ICD-10-CM | POA: Diagnosis not present

## 2021-06-22 DIAGNOSIS — D099 Carcinoma in situ, unspecified: Secondary | ICD-10-CM

## 2021-06-22 MED ORDER — MUPIROCIN 2 % EX OINT: 1.0000 "application " | TOPICAL_OINTMENT | Freq: Every day | CUTANEOUS | 0 refills | Status: DC

## 2021-06-22 NOTE — Patient Instructions (Signed)
Electrodesiccation and Curettage (Scrape and Burn) Wound Care Instructions  Leave the original bandage on for 24 hours if possible.  If the bandage becomes soaked or soiled before that time, it is OK to remove it and examine the wound.  A small amount of post-operative bleeding is normal.  If excessive bleeding occurs, remove the bandage, place gauze over the site and apply continuous pressure (no peeking) over the area for 30 minutes. If this does not work, please call our clinic as soon as possible or page your doctor if it is after hours.   Once a day, cleanse the wound with soap and water. It is fine to shower. If a thick crust develops you may use a Q-tip dipped into dilute hydrogen peroxide (mix 1:1 with water) to dissolve it.  Hydrogen peroxide can slow the healing process, so use it only as needed.    After washing, apply petroleum jelly (Vaseline) or an antibiotic ointment if your doctor prescribed one for you, followed by a bandage.    For best healing, the wound should be covered with a layer of ointment at all times. If you are not able to keep the area covered with a bandage to hold the ointment in place, this may mean re-applying the ointment several times a day.  Continue this wound care until the wound has healed and is no longer open. It may take several weeks for the wound to heal and close.  Itching and mild discomfort is normal during the healing process.  If you have any discomfort, you can take Tylenol (acetaminophen) or ibuprofen as directed on the bottle. (Please do not take these if you have an allergy to them or cannot take them for another reason).  Some redness, tenderness and white or yellow material in the wound is normal healing.  If the area becomes very sore and red, or develops a thick yellow-green material (pus), it may be infected; please notify us.    Wound healing continues for up to one year following surgery. It is not unusual to experience pain in the scar  from time to time during the interval.  If the pain becomes severe or the scar thickens, you should notify the office.    A slight amount of redness in a scar is expected for the first six months.  After six months, the redness will fade and the scar will soften and fade.  The color difference becomes less noticeable with time.  If there are any problems, return for a post-op surgery check at your earliest convenience.  To improve the appearance of the scar, you can use silicone scar gel, cream, or sheets (such as Mederma or Serica) every night for up to one year. These are available over the counter (without a prescription).  Please call our office at [phone] for any questions or concerns.       If You Need Anything After Your Visit  If you have any questions or concerns for your doctor, please call our main line at [phone] and press option 4 to reach your doctor's medical assistant. If no one answers, please leave a voicemail as directed and we will return your call as soon as possible. Messages left after 4 pm will be answered the following business day.   You may also send [place] a message via [place]. We typically respond to MyChart messages within 1-2 business days.  For prescription refills, please ask your pharmacy to contact our office. Our fax number is [phone].  If you have an urgent issue when the clinic is closed that cannot wait until the next business day, you can page your doctor at the number below.    Please note that while we do our best to be available for urgent issues outside of office hours, we are not available 24/7.   If you have an urgent issue and are unable to reach [place], you may choose to seek medical care at your doctor's office, retail clinic, urgent care center, or emergency room.  If you have a medical emergency, please immediately call 911 or go to the emergency department.  Pager Numbers  - Dr. Nehemiah Massed: [phone]  - Dr. Laurence Ferrari:  [phone]  - Dr. Nicole Kindred: [phone]  In the event of inclement weather, please call our main line at [phone] for an update on the status of any delays or closures.  Dermatology Medication Tips: Please keep the boxes that topical medications come in in order to help keep track of the instructions about where and how to use these. Pharmacies typically print the medication instructions only on the boxes and not directly on the medication tubes.   If your medication is too expensive, please contact our office at [phone] option 4 or send [place] a message through [place].   We are unable to tell what your co-pay for medications will be in advance as this is different depending on your insurance coverage. However, we may be able to find a substitute medication at lower cost or fill out paperwork to get insurance to cover a needed medication.   If a prior authorization is required to get your medication covered by your insurance company, please allow [place] 1-2 business days to complete this process.  Drug prices often vary depending on where the prescription is filled and some pharmacies may offer cheaper prices.  The website www.goodrx.com contains coupons for medications through different pharmacies. The prices here do not account for what the cost may be with help from insurance (it may be cheaper with your insurance), but the website can give you the price if you did not use any insurance.  - You can print the associated coupon and take it with your prescription to the pharmacy.  - You may also stop by our office during regular business hours and pick up a GoodRx coupon card.  - If you need your prescription sent electronically to a different pharmacy, notify our office through [facility] or by phone at [phone] option 4.     Si Usted Necesita Algo Despus de Su Visita  Tambin puede enviarnos un mensaje a travs de Pharmacist, community. Por lo general respondemos a los mensajes de  MyChart en el transcurso de 1 a 2 das hbiles.  Para renovar recetas, por favor pida a su farmacia que se ponga en contacto con nuestra oficina. Harland Dingwall de fax es [place] [phone].  Si tiene un asunto urgente cuando la clnica est cerrada y que no puede esperar hasta el siguiente da hbil, puede llamar/localizar a su doctor(a) al nmero que aparece a continuacin.   Por favor, tenga en cuenta que aunque hacemos todo lo posible para estar disponibles para asuntos urgentes fuera del horario de [place], no estamos disponibles las 24 horas del da, los 7 das de la [place].   Si tiene un problema urgente y no puede comunicarse con nosotros, puede optar por buscar atencin mdica  en el consultorio de su doctor(a), en una clnica privada, en un centro de atencin urgente o en  una sala de emergencias.  Si tiene Engineering geologist, por favor llame inmediatamente al 911 o vaya a la sala de emergencias.  Nmeros de bper  - Dr. Nehemiah Massed: [phone]  - Dra. Moye: [phone]  - Dra. Nicole Kindred: [phone]  En caso de inclemencias del [place], por favor llame a Johnsie Kindred principal al [phone] para una actualizacin sobre el [place] de cualquier retraso o cierre.  Consejos para la medicacin en dermatologa: Por favor, guarde las cajas en las que vienen los medicamentos de uso tpico para ayudarle a seguir las instrucciones sobre dnde y cmo usarlos. Las farmacias generalmente imprimen las instrucciones del medicamento slo en las cajas y no directamente en los tubos del [place].   Si su medicamento es muy caro, por favor, pngase en contacto con Zigmund Daniel llamando al [phone] y presione la opcin 4 o envenos un mensaje a travs de Pharmacist, community.   No podemos decirle cul ser su copago por los medicamentos por adelantado ya que esto es diferente dependiendo de la cobertura de su seguro. Sin embargo, es posible que podamos encontrar un medicamento sustituto a Contractor un formulario para que el seguro cubra el medicamento que se considera necesario.   Si se requiere una autorizacin previa para que su compaa de seguros [place] su medicamento, por favor permtanos de 1 a 2 das hbiles para completar este proceso.  Los precios de los medicamentos varan con frecuencia dependiendo del Environmental consultant de dnde se surte la receta y alguna farmacias pueden ofrecer precios ms baratos.  El sitio web www.goodrx.com tiene cupones para medicamentos de Airline pilot. Los precios aqu no tienen en cuenta lo que podra costar con la ayuda del seguro (puede ser ms barato con su seguro), pero el sitio web puede darle el precio si no utiliz Research scientist (physical sciences).  - Puede imprimir el cupn correspondiente y llevarlo con su receta a la farmacia.  - Tambin puede pasar por nuestra oficina durante el horario de atencin regular y Charity fundraiser una tarjeta de cupones de GoodRx.  - Si necesita que su receta se enve electrnicamente a una farmacia diferente, informe a nuestra oficina a travs de MyChart de [place] o por telfono llamando al [phone] y presione la opcin 4.

## 2021-06-22 NOTE — Progress Notes (Signed)
? ?Follow-Up Visit ?  ?Subjective  ?[name] is a 88 y.o. female who presents for the following: Follow-up (Patient here today for treatment biopsy proven scc at left dorsal hand over mcp and sccis at right lower leg medial. Patient also reports a few spots at left hand. ). ? ?Patient reports a spot at right dorsal hand she would like checked ? ?The following portions of the chart were reviewed this encounter and updated as appropriate:  Tobacco  Allergies  Meds  Problems  Med Hx  Surg Hx  Fam Hx   ?  ? ?Review of Systems: No other skin or systemic complaints except as noted in HPI or Assessment and Plan. ? ? ?Objective  ?Well appearing patient in no apparent distress; mood and affect are within normal limits. ? ?A focused examination was performed including right lower leg medial and left dorsal hand over mcp. Relevant physical exam findings are noted in the Assessment and Plan. ? ?Left dorsal hand over mcp ?Pink scaly papule ? ?right lower leg medial ?Scaly pink plaque ? ? ?Assessment & Plan  ?Squamous cell carcinoma of skin ?Left dorsal hand over mcp ? ?Destruction of lesion ? ?Destruction method: electrodesiccation and curettage   ?Informed consent: discussed and consent obtained   ?Timeout:  patient name, date of birth, surgical site, and procedure verified ?Patient was prepped and draped in usual sterile fashion: area prepped with isopropyl alcohol. ?Anesthesia: the lesion was anesthetized in a standard fashion   ?Anesthetic:  1% lidocaine w/ epinephrine 1-100,000 buffered w/ 8.4% NaHCO3 ?Curettage performed in three different directions: Yes   ?Electrodesiccation performed over the curetted area: Yes   ?Curettage cycles:  3 ?Final wound size (cm):  0.8 ?Hemostasis achieved with:  electrodesiccation ?Outcome: patient tolerated procedure well with no complications   ?Post-procedure details: sterile dressing applied and wound care instructions given   ?Dressing type: petrolatum   ?Additional  details:  Mupirocin and a pressure dressing applied ? ?Biopsy proven left dorsal hand over MCP ?WELL DIFFERENTIATED SQUAMOUS CELL CARCINOMA WITH SUPERFICIAL INFILTRATION, BASE INVOLVED  ? ?ED&C done today  ? ?Related Medications ?mupirocin ointment (BACTROBAN) 2 % ?Apply 1 application topically daily. Apply to open wounds at left hand and right lower leg until healed ? ?Squamous cell carcinoma in situ ?right lower leg medial ? ?Destruction of lesion ? ?Destruction method: electrodesiccation and curettage   ?Informed consent: discussed and consent obtained   ?Timeout:  patient name, date of birth, surgical site, and procedure verified ?Anesthesia: the lesion was anesthetized in a standard fashion   ?Anesthetic:  1% lidocaine w/ epinephrine 1-100,000 buffered w/ 8.4% NaHCO3 ?Curettage performed in three different directions: Yes   ?Electrodesiccation performed over the curetted area: Yes   ?Curettage cycles:  3 ?Final wound size (cm):  1.6 ?Hemostasis achieved with:  electrodesiccation ?Outcome: patient tolerated procedure well with no complications   ?Post-procedure details: sterile dressing applied and wound care instructions given   ?Dressing type: petrolatum   ? ?Biopsy proven right lower leg-medial ?SQUAMOUS CELL CARCINOMA IN SITU, PERIPHERAL MARGIN INVOLVED --> ED&C today ? ?Related Medications ?mupirocin ointment (BACTROBAN) 2 % ?Apply 1 application topically daily. Apply to open wounds at left hand and right lower leg until healed ? ? ?Return for keep follow up as schedule 08/22/21, 6 month tbse . ?I, Ruthell Rummage, CMA, am acting as scribe for Forest Gleason, MD. ? ?Documentation: I have reviewed the above documentation for accuracy and completeness, and I agree with the above. ? ?VIRGINA  Nhu Glasby, MD ? ? ?

## 2021-06-23 ENCOUNTER — Encounter: Payer: Self-pay | Admitting: Dermatology

## 2021-06-23 ENCOUNTER — Ambulatory Visit: Payer: Medicare Other

## 2021-06-23 DIAGNOSIS — E1122 Type 2 diabetes mellitus with diabetic chronic kidney disease: Secondary | ICD-10-CM | POA: Diagnosis not present

## 2021-06-23 DIAGNOSIS — Z483 Aftercare following surgery for neoplasm: Secondary | ICD-10-CM | POA: Diagnosis not present

## 2021-06-23 DIAGNOSIS — Z4801 Encounter for change or removal of surgical wound dressing: Secondary | ICD-10-CM | POA: Diagnosis not present

## 2021-06-23 DIAGNOSIS — I129 Hypertensive chronic kidney disease with stage 1 through stage 4 chronic kidney disease, or unspecified chronic kidney disease: Secondary | ICD-10-CM | POA: Diagnosis not present

## 2021-06-23 DIAGNOSIS — N189 Chronic kidney disease, unspecified: Secondary | ICD-10-CM | POA: Diagnosis not present

## 2021-06-23 DIAGNOSIS — D485 Neoplasm of uncertain behavior of skin: Secondary | ICD-10-CM | POA: Diagnosis not present

## 2021-06-24 ENCOUNTER — Ambulatory Visit
Admission: RE | Admit: 2021-06-24 | Discharge: 2021-06-24 | Disposition: A | Discharge status: Home / Self Care | Payer: Medicare Other | Source: Ambulatory Visit | Attending: Radiation Oncology | Admitting: Radiation Oncology

## 2021-06-24 ENCOUNTER — Other Ambulatory Visit: Payer: Self-pay

## 2021-06-24 DIAGNOSIS — C44311 Basal cell carcinoma of skin of nose: Secondary | ICD-10-CM | POA: Diagnosis not present

## 2021-06-27 ENCOUNTER — Other Ambulatory Visit: Payer: Self-pay

## 2021-06-27 ENCOUNTER — Ambulatory Visit
Admission: RE | Admit: 2021-06-27 | Discharge: 2021-06-27 | Disposition: A | Discharge status: Home / Self Care | Payer: Medicare Other | Source: Ambulatory Visit | Attending: Radiation Oncology | Admitting: Radiation Oncology

## 2021-06-27 DIAGNOSIS — C44311 Basal cell carcinoma of skin of nose: Secondary | ICD-10-CM | POA: Diagnosis not present

## 2021-06-27 DIAGNOSIS — E1122 Type 2 diabetes mellitus with diabetic chronic kidney disease: Secondary | ICD-10-CM | POA: Diagnosis not present

## 2021-06-27 DIAGNOSIS — D485 Neoplasm of uncertain behavior of skin: Secondary | ICD-10-CM | POA: Diagnosis not present

## 2021-06-27 DIAGNOSIS — N1831 Chronic kidney disease, stage 3a: Secondary | ICD-10-CM | POA: Diagnosis not present

## 2021-06-27 DIAGNOSIS — I129 Hypertensive chronic kidney disease with stage 1 through stage 4 chronic kidney disease, or unspecified chronic kidney disease: Secondary | ICD-10-CM | POA: Diagnosis not present

## 2021-06-27 MED ORDER — SONAFINE EX EMUL
1.0000 "application " | Freq: Two times a day (BID) | CUTANEOUS | Status: DC
  Administered 2021-06-27: 1 via TOPICAL

## 2021-06-28 ENCOUNTER — Ambulatory Visit: Payer: Medicare Other

## 2021-06-28 DIAGNOSIS — D485 Neoplasm of uncertain behavior of skin: Secondary | ICD-10-CM | POA: Diagnosis not present

## 2021-06-28 DIAGNOSIS — E1122 Type 2 diabetes mellitus with diabetic chronic kidney disease: Secondary | ICD-10-CM | POA: Diagnosis not present

## 2021-06-28 DIAGNOSIS — Z483 Aftercare following surgery for neoplasm: Secondary | ICD-10-CM | POA: Diagnosis not present

## 2021-06-28 DIAGNOSIS — Z4801 Encounter for change or removal of surgical wound dressing: Secondary | ICD-10-CM | POA: Diagnosis not present

## 2021-06-28 DIAGNOSIS — I129 Hypertensive chronic kidney disease with stage 1 through stage 4 chronic kidney disease, or unspecified chronic kidney disease: Secondary | ICD-10-CM | POA: Diagnosis not present

## 2021-06-28 DIAGNOSIS — N189 Chronic kidney disease, unspecified: Secondary | ICD-10-CM | POA: Diagnosis not present

## 2021-06-29 ENCOUNTER — Ambulatory Visit: Payer: Medicare Other

## 2021-06-29 DIAGNOSIS — E1122 Type 2 diabetes mellitus with diabetic chronic kidney disease: Secondary | ICD-10-CM | POA: Diagnosis not present

## 2021-06-29 DIAGNOSIS — D485 Neoplasm of uncertain behavior of skin: Secondary | ICD-10-CM | POA: Diagnosis not present

## 2021-06-29 DIAGNOSIS — N189 Chronic kidney disease, unspecified: Secondary | ICD-10-CM | POA: Diagnosis not present

## 2021-06-29 DIAGNOSIS — Z4801 Encounter for change or removal of surgical wound dressing: Secondary | ICD-10-CM | POA: Diagnosis not present

## 2021-06-29 DIAGNOSIS — I129 Hypertensive chronic kidney disease with stage 1 through stage 4 chronic kidney disease, or unspecified chronic kidney disease: Secondary | ICD-10-CM | POA: Diagnosis not present

## 2021-06-29 DIAGNOSIS — Z483 Aftercare following surgery for neoplasm: Secondary | ICD-10-CM | POA: Diagnosis not present

## 2021-06-29 NOTE — Progress Notes (Addendum)
Oncology Nurse Navigator Documentation  ? ?I received a call from Ms. Sidell's daughter, [name] that an order for sonafine to be applied to Ms. Hebel's radiation site twice daily was needed by her ALF as prescribed by Dr. Eppie Gibson. I have faxed the order with instructions to the number she provided with notification of successful fax transmission received. I have also called [name] to inform her that they should have the order and if they need anything else to give me a call and also let her know that according to Dr. Isidore Moos her radiation treatment is going well. She voiced her appreciation. ? ?Harlow Asa RN, BSN, OCN ?Head & Neck Oncology Nurse Navigator ?[place] [facility] ?Phone # [phone]  ?Fax # [phone]  ?

## 2021-06-30 ENCOUNTER — Ambulatory Visit: Payer: Medicare Other

## 2021-06-30 DIAGNOSIS — Z483 Aftercare following surgery for neoplasm: Secondary | ICD-10-CM | POA: Diagnosis not present

## 2021-06-30 DIAGNOSIS — Z993 Dependence on wheelchair: Secondary | ICD-10-CM | POA: Diagnosis not present

## 2021-06-30 DIAGNOSIS — D72829 Elevated white blood cell count, unspecified: Secondary | ICD-10-CM | POA: Diagnosis not present

## 2021-06-30 DIAGNOSIS — E1122 Type 2 diabetes mellitus with diabetic chronic kidney disease: Secondary | ICD-10-CM | POA: Diagnosis not present

## 2021-06-30 DIAGNOSIS — Z79891 Long term (current) use of opiate analgesic: Secondary | ICD-10-CM | POA: Diagnosis not present

## 2021-06-30 DIAGNOSIS — B353 Tinea pedis: Secondary | ICD-10-CM | POA: Diagnosis not present

## 2021-06-30 DIAGNOSIS — M542 Cervicalgia: Secondary | ICD-10-CM | POA: Diagnosis not present

## 2021-06-30 DIAGNOSIS — Z4801 Encounter for change or removal of surgical wound dressing: Secondary | ICD-10-CM | POA: Diagnosis not present

## 2021-06-30 DIAGNOSIS — Z87891 Personal history of nicotine dependence: Secondary | ICD-10-CM | POA: Diagnosis not present

## 2021-06-30 DIAGNOSIS — Z7982 Long term (current) use of aspirin: Secondary | ICD-10-CM | POA: Diagnosis not present

## 2021-06-30 DIAGNOSIS — G8929 Other chronic pain: Secondary | ICD-10-CM | POA: Diagnosis not present

## 2021-06-30 DIAGNOSIS — I129 Hypertensive chronic kidney disease with stage 1 through stage 4 chronic kidney disease, or unspecified chronic kidney disease: Secondary | ICD-10-CM | POA: Diagnosis not present

## 2021-06-30 DIAGNOSIS — L570 Actinic keratosis: Secondary | ICD-10-CM | POA: Diagnosis not present

## 2021-06-30 DIAGNOSIS — N189 Chronic kidney disease, unspecified: Secondary | ICD-10-CM | POA: Diagnosis not present

## 2021-06-30 DIAGNOSIS — M159 Polyosteoarthritis, unspecified: Secondary | ICD-10-CM | POA: Diagnosis not present

## 2021-06-30 DIAGNOSIS — M25551 Pain in right hip: Secondary | ICD-10-CM | POA: Diagnosis not present

## 2021-06-30 DIAGNOSIS — M810 Age-related osteoporosis without current pathological fracture: Secondary | ICD-10-CM | POA: Diagnosis not present

## 2021-06-30 DIAGNOSIS — Z7409 Other reduced mobility: Secondary | ICD-10-CM | POA: Diagnosis not present

## 2021-06-30 DIAGNOSIS — N3281 Overactive bladder: Secondary | ICD-10-CM | POA: Diagnosis not present

## 2021-06-30 DIAGNOSIS — D485 Neoplasm of uncertain behavior of skin: Secondary | ICD-10-CM | POA: Diagnosis not present

## 2021-06-30 DIAGNOSIS — L219 Seborrheic dermatitis, unspecified: Secondary | ICD-10-CM | POA: Diagnosis not present

## 2021-06-30 DIAGNOSIS — R4189 Other symptoms and signs involving cognitive functions and awareness: Secondary | ICD-10-CM | POA: Diagnosis not present

## 2021-06-30 DIAGNOSIS — H5789 Other specified disorders of eye and adnexa: Secondary | ICD-10-CM | POA: Diagnosis not present

## 2021-06-30 DIAGNOSIS — N3000 Acute cystitis without hematuria: Secondary | ICD-10-CM | POA: Diagnosis not present

## 2021-06-30 DIAGNOSIS — Z9181 History of falling: Secondary | ICD-10-CM | POA: Diagnosis not present

## 2021-07-01 ENCOUNTER — Other Ambulatory Visit: Payer: Self-pay

## 2021-07-01 ENCOUNTER — Ambulatory Visit
Admission: RE | Admit: 2021-07-01 | Discharge: 2021-07-01 | Disposition: A | Discharge status: Home / Self Care | Payer: Medicare Other | Source: Ambulatory Visit | Attending: Radiation Oncology | Admitting: Radiation Oncology

## 2021-07-01 DIAGNOSIS — C44311 Basal cell carcinoma of skin of nose: Secondary | ICD-10-CM | POA: Diagnosis not present

## 2021-07-04 ENCOUNTER — Other Ambulatory Visit: Payer: Self-pay

## 2021-07-04 ENCOUNTER — Ambulatory Visit
Admission: RE | Admit: 2021-07-04 | Discharge: 2021-07-04 | Disposition: A | Discharge status: Home / Self Care | Payer: Medicare Other | Source: Ambulatory Visit | Attending: Radiation Oncology | Admitting: Radiation Oncology

## 2021-07-04 DIAGNOSIS — C44311 Basal cell carcinoma of skin of nose: Secondary | ICD-10-CM | POA: Diagnosis not present

## 2021-07-04 DIAGNOSIS — Z510 Encounter for antineoplastic radiation therapy: Secondary | ICD-10-CM | POA: Diagnosis not present

## 2021-07-04 DIAGNOSIS — Z483 Aftercare following surgery for neoplasm: Secondary | ICD-10-CM | POA: Diagnosis not present

## 2021-07-04 DIAGNOSIS — N189 Chronic kidney disease, unspecified: Secondary | ICD-10-CM | POA: Diagnosis not present

## 2021-07-04 DIAGNOSIS — E1122 Type 2 diabetes mellitus with diabetic chronic kidney disease: Secondary | ICD-10-CM | POA: Diagnosis not present

## 2021-07-04 DIAGNOSIS — I129 Hypertensive chronic kidney disease with stage 1 through stage 4 chronic kidney disease, or unspecified chronic kidney disease: Secondary | ICD-10-CM | POA: Diagnosis not present

## 2021-07-04 DIAGNOSIS — Z4801 Encounter for change or removal of surgical wound dressing: Secondary | ICD-10-CM | POA: Diagnosis not present

## 2021-07-04 DIAGNOSIS — D485 Neoplasm of uncertain behavior of skin: Secondary | ICD-10-CM | POA: Diagnosis not present

## 2021-07-05 ENCOUNTER — Telehealth: Payer: Self-pay

## 2021-07-05 DIAGNOSIS — N189 Chronic kidney disease, unspecified: Secondary | ICD-10-CM | POA: Diagnosis not present

## 2021-07-05 DIAGNOSIS — I129 Hypertensive chronic kidney disease with stage 1 through stage 4 chronic kidney disease, or unspecified chronic kidney disease: Secondary | ICD-10-CM | POA: Diagnosis not present

## 2021-07-05 DIAGNOSIS — E1122 Type 2 diabetes mellitus with diabetic chronic kidney disease: Secondary | ICD-10-CM | POA: Diagnosis not present

## 2021-07-05 DIAGNOSIS — Z4801 Encounter for change or removal of surgical wound dressing: Secondary | ICD-10-CM | POA: Diagnosis not present

## 2021-07-05 DIAGNOSIS — D485 Neoplasm of uncertain behavior of skin: Secondary | ICD-10-CM | POA: Diagnosis not present

## 2021-07-05 DIAGNOSIS — Z483 Aftercare following surgery for neoplasm: Secondary | ICD-10-CM | POA: Diagnosis not present

## 2021-07-05 NOTE — Telephone Encounter (Signed)
Called nurse Elmyra Ricks back at [phone]. She was given verbal orders. She did state she can only do these orders three days a week. Monday, Wednesday and Friday. Advised her to take orders now. I would advised Dr. Nehemiah Massed and call back if any issues.  ?

## 2021-07-05 NOTE — Telephone Encounter (Signed)
Dr. Ms patient: ?[name] who is a nurse with patient's home health called today. She states with the facility patient lives in and for insurance purposes she is not getting the Mupirocin Ointment applied daily. Apparently Mupirocin is not seen as a "skill" and does not require orders or mandatory application as directed. [name] states the hand and legs are not healed and need better care. She states if you are OK with this they can apply xeroform wound dressings daily and this would be seen as a "skill" and a nurse aid would take care of this every day, along with redressing the wounds.  ? ?She states she stopped by today to check patient and was unsure when the last time her leg dressing had been cleaned or changed.  ?

## 2021-07-07 DIAGNOSIS — Z4801 Encounter for change or removal of surgical wound dressing: Secondary | ICD-10-CM | POA: Diagnosis not present

## 2021-07-07 DIAGNOSIS — Z483 Aftercare following surgery for neoplasm: Secondary | ICD-10-CM | POA: Diagnosis not present

## 2021-07-07 DIAGNOSIS — I129 Hypertensive chronic kidney disease with stage 1 through stage 4 chronic kidney disease, or unspecified chronic kidney disease: Secondary | ICD-10-CM | POA: Diagnosis not present

## 2021-07-07 DIAGNOSIS — D485 Neoplasm of uncertain behavior of skin: Secondary | ICD-10-CM | POA: Diagnosis not present

## 2021-07-07 DIAGNOSIS — E1122 Type 2 diabetes mellitus with diabetic chronic kidney disease: Secondary | ICD-10-CM | POA: Diagnosis not present

## 2021-07-07 DIAGNOSIS — N189 Chronic kidney disease, unspecified: Secondary | ICD-10-CM | POA: Diagnosis not present

## 2021-07-08 ENCOUNTER — Other Ambulatory Visit: Payer: Self-pay

## 2021-07-08 ENCOUNTER — Ambulatory Visit
Admission: RE | Admit: 2021-07-08 | Discharge: 2021-07-08 | Disposition: A | Discharge status: Home / Self Care | Payer: Medicare Other | Source: Ambulatory Visit | Attending: Radiation Oncology | Admitting: Radiation Oncology

## 2021-07-08 ENCOUNTER — Telehealth: Payer: Self-pay

## 2021-07-08 DIAGNOSIS — E1122 Type 2 diabetes mellitus with diabetic chronic kidney disease: Secondary | ICD-10-CM | POA: Diagnosis not present

## 2021-07-08 DIAGNOSIS — Z4801 Encounter for change or removal of surgical wound dressing: Secondary | ICD-10-CM | POA: Diagnosis not present

## 2021-07-08 DIAGNOSIS — Z483 Aftercare following surgery for neoplasm: Secondary | ICD-10-CM | POA: Diagnosis not present

## 2021-07-08 DIAGNOSIS — N189 Chronic kidney disease, unspecified: Secondary | ICD-10-CM | POA: Diagnosis not present

## 2021-07-08 DIAGNOSIS — I129 Hypertensive chronic kidney disease with stage 1 through stage 4 chronic kidney disease, or unspecified chronic kidney disease: Secondary | ICD-10-CM | POA: Diagnosis not present

## 2021-07-08 DIAGNOSIS — D485 Neoplasm of uncertain behavior of skin: Secondary | ICD-10-CM | POA: Diagnosis not present

## 2021-07-08 DIAGNOSIS — C44311 Basal cell carcinoma of skin of nose: Secondary | ICD-10-CM | POA: Diagnosis not present

## 2021-07-08 NOTE — Telephone Encounter (Signed)
Patient's daughter called to check on status of CMN. ?

## 2021-07-11 ENCOUNTER — Ambulatory Visit
Admission: RE | Admit: 2021-07-11 | Discharge: 2021-07-11 | Disposition: A | Discharge status: Home / Self Care | Payer: Medicare Other | Source: Ambulatory Visit | Attending: Radiation Oncology | Admitting: Radiation Oncology

## 2021-07-11 ENCOUNTER — Other Ambulatory Visit: Payer: Self-pay

## 2021-07-11 DIAGNOSIS — Z4801 Encounter for change or removal of surgical wound dressing: Secondary | ICD-10-CM | POA: Diagnosis not present

## 2021-07-11 DIAGNOSIS — N189 Chronic kidney disease, unspecified: Secondary | ICD-10-CM | POA: Diagnosis not present

## 2021-07-11 DIAGNOSIS — C44311 Basal cell carcinoma of skin of nose: Secondary | ICD-10-CM | POA: Diagnosis not present

## 2021-07-11 DIAGNOSIS — Z483 Aftercare following surgery for neoplasm: Secondary | ICD-10-CM | POA: Diagnosis not present

## 2021-07-11 DIAGNOSIS — E1122 Type 2 diabetes mellitus with diabetic chronic kidney disease: Secondary | ICD-10-CM | POA: Diagnosis not present

## 2021-07-11 DIAGNOSIS — I129 Hypertensive chronic kidney disease with stage 1 through stage 4 chronic kidney disease, or unspecified chronic kidney disease: Secondary | ICD-10-CM | POA: Diagnosis not present

## 2021-07-11 DIAGNOSIS — D485 Neoplasm of uncertain behavior of skin: Secondary | ICD-10-CM | POA: Diagnosis not present

## 2021-07-13 DIAGNOSIS — N189 Chronic kidney disease, unspecified: Secondary | ICD-10-CM | POA: Diagnosis not present

## 2021-07-13 DIAGNOSIS — M19011 Primary osteoarthritis, right shoulder: Secondary | ICD-10-CM | POA: Diagnosis not present

## 2021-07-13 DIAGNOSIS — Z483 Aftercare following surgery for neoplasm: Secondary | ICD-10-CM | POA: Diagnosis not present

## 2021-07-13 DIAGNOSIS — D485 Neoplasm of uncertain behavior of skin: Secondary | ICD-10-CM | POA: Diagnosis not present

## 2021-07-13 DIAGNOSIS — I129 Hypertensive chronic kidney disease with stage 1 through stage 4 chronic kidney disease, or unspecified chronic kidney disease: Secondary | ICD-10-CM | POA: Diagnosis not present

## 2021-07-13 DIAGNOSIS — E1122 Type 2 diabetes mellitus with diabetic chronic kidney disease: Secondary | ICD-10-CM | POA: Diagnosis not present

## 2021-07-13 DIAGNOSIS — Z4801 Encounter for change or removal of surgical wound dressing: Secondary | ICD-10-CM | POA: Diagnosis not present

## 2021-07-15 ENCOUNTER — Other Ambulatory Visit: Payer: Self-pay

## 2021-07-15 ENCOUNTER — Ambulatory Visit
Admission: RE | Admit: 2021-07-15 | Discharge: 2021-07-15 | Disposition: A | Discharge status: Home / Self Care | Payer: Medicare Other | Source: Ambulatory Visit | Attending: Radiation Oncology | Admitting: Radiation Oncology

## 2021-07-15 DIAGNOSIS — I129 Hypertensive chronic kidney disease with stage 1 through stage 4 chronic kidney disease, or unspecified chronic kidney disease: Secondary | ICD-10-CM | POA: Diagnosis not present

## 2021-07-15 DIAGNOSIS — D485 Neoplasm of uncertain behavior of skin: Secondary | ICD-10-CM | POA: Diagnosis not present

## 2021-07-15 DIAGNOSIS — Z4801 Encounter for change or removal of surgical wound dressing: Secondary | ICD-10-CM | POA: Diagnosis not present

## 2021-07-15 DIAGNOSIS — N189 Chronic kidney disease, unspecified: Secondary | ICD-10-CM | POA: Diagnosis not present

## 2021-07-15 DIAGNOSIS — E1122 Type 2 diabetes mellitus with diabetic chronic kidney disease: Secondary | ICD-10-CM | POA: Diagnosis not present

## 2021-07-15 DIAGNOSIS — C44311 Basal cell carcinoma of skin of nose: Secondary | ICD-10-CM | POA: Diagnosis not present

## 2021-07-15 DIAGNOSIS — Z483 Aftercare following surgery for neoplasm: Secondary | ICD-10-CM | POA: Diagnosis not present

## 2021-07-18 ENCOUNTER — Ambulatory Visit: Payer: Medicare Other

## 2021-07-18 ENCOUNTER — Telehealth: Payer: Self-pay | Admitting: Radiation Oncology

## 2021-07-18 DIAGNOSIS — I129 Hypertensive chronic kidney disease with stage 1 through stage 4 chronic kidney disease, or unspecified chronic kidney disease: Secondary | ICD-10-CM | POA: Diagnosis not present

## 2021-07-18 DIAGNOSIS — Z483 Aftercare following surgery for neoplasm: Secondary | ICD-10-CM | POA: Diagnosis not present

## 2021-07-18 DIAGNOSIS — D485 Neoplasm of uncertain behavior of skin: Secondary | ICD-10-CM | POA: Diagnosis not present

## 2021-07-18 DIAGNOSIS — K5901 Slow transit constipation: Secondary | ICD-10-CM | POA: Diagnosis not present

## 2021-07-18 DIAGNOSIS — E1122 Type 2 diabetes mellitus with diabetic chronic kidney disease: Secondary | ICD-10-CM | POA: Diagnosis not present

## 2021-07-18 DIAGNOSIS — N189 Chronic kidney disease, unspecified: Secondary | ICD-10-CM | POA: Diagnosis not present

## 2021-07-18 DIAGNOSIS — Z4801 Encounter for change or removal of surgical wound dressing: Secondary | ICD-10-CM | POA: Diagnosis not present

## 2021-07-18 DIAGNOSIS — N1831 Chronic kidney disease, stage 3a: Secondary | ICD-10-CM | POA: Diagnosis not present

## 2021-07-18 NOTE — Telephone Encounter (Signed)
Margrit Tiffany Kocher called on behalf of patient stating the patient is not feeling well, and will not be able to come in for 3/27 treatment. L3 notified.  ?

## 2021-07-19 DIAGNOSIS — D485 Neoplasm of uncertain behavior of skin: Secondary | ICD-10-CM | POA: Diagnosis not present

## 2021-07-19 DIAGNOSIS — N189 Chronic kidney disease, unspecified: Secondary | ICD-10-CM | POA: Diagnosis not present

## 2021-07-19 DIAGNOSIS — I129 Hypertensive chronic kidney disease with stage 1 through stage 4 chronic kidney disease, or unspecified chronic kidney disease: Secondary | ICD-10-CM | POA: Diagnosis not present

## 2021-07-19 DIAGNOSIS — Z483 Aftercare following surgery for neoplasm: Secondary | ICD-10-CM | POA: Diagnosis not present

## 2021-07-19 DIAGNOSIS — E1122 Type 2 diabetes mellitus with diabetic chronic kidney disease: Secondary | ICD-10-CM | POA: Diagnosis not present

## 2021-07-19 DIAGNOSIS — Z4801 Encounter for change or removal of surgical wound dressing: Secondary | ICD-10-CM | POA: Diagnosis not present

## 2021-07-20 DIAGNOSIS — Z4801 Encounter for change or removal of surgical wound dressing: Secondary | ICD-10-CM | POA: Diagnosis not present

## 2021-07-20 DIAGNOSIS — I129 Hypertensive chronic kidney disease with stage 1 through stage 4 chronic kidney disease, or unspecified chronic kidney disease: Secondary | ICD-10-CM | POA: Diagnosis not present

## 2021-07-20 DIAGNOSIS — E1122 Type 2 diabetes mellitus with diabetic chronic kidney disease: Secondary | ICD-10-CM | POA: Diagnosis not present

## 2021-07-20 DIAGNOSIS — Z483 Aftercare following surgery for neoplasm: Secondary | ICD-10-CM | POA: Diagnosis not present

## 2021-07-20 DIAGNOSIS — D485 Neoplasm of uncertain behavior of skin: Secondary | ICD-10-CM | POA: Diagnosis not present

## 2021-07-20 DIAGNOSIS — N189 Chronic kidney disease, unspecified: Secondary | ICD-10-CM | POA: Diagnosis not present

## 2021-07-21 DIAGNOSIS — E1122 Type 2 diabetes mellitus with diabetic chronic kidney disease: Secondary | ICD-10-CM | POA: Diagnosis not present

## 2021-07-21 DIAGNOSIS — D485 Neoplasm of uncertain behavior of skin: Secondary | ICD-10-CM | POA: Diagnosis not present

## 2021-07-21 DIAGNOSIS — N189 Chronic kidney disease, unspecified: Secondary | ICD-10-CM | POA: Diagnosis not present

## 2021-07-21 DIAGNOSIS — Z4801 Encounter for change or removal of surgical wound dressing: Secondary | ICD-10-CM | POA: Diagnosis not present

## 2021-07-21 DIAGNOSIS — Z483 Aftercare following surgery for neoplasm: Secondary | ICD-10-CM | POA: Diagnosis not present

## 2021-07-21 DIAGNOSIS — I129 Hypertensive chronic kidney disease with stage 1 through stage 4 chronic kidney disease, or unspecified chronic kidney disease: Secondary | ICD-10-CM | POA: Diagnosis not present

## 2021-07-22 ENCOUNTER — Ambulatory Visit: Payer: Medicare Other

## 2021-07-22 ENCOUNTER — Ambulatory Visit (HOSPITAL_COMMUNITY)
Admission: EM | Admit: 2021-07-22 | Discharge: 2021-07-22 | Disposition: A | Discharge status: Home / Self Care | Payer: Medicare Other | Attending: Family Medicine | Admitting: Family Medicine

## 2021-07-22 ENCOUNTER — Ambulatory Visit (INDEPENDENT_AMBULATORY_CARE_PROVIDER_SITE_OTHER): Payer: Medicare Other

## 2021-07-22 ENCOUNTER — Encounter (HOSPITAL_COMMUNITY): Payer: Self-pay

## 2021-07-22 DIAGNOSIS — R1010 Upper abdominal pain, unspecified: Secondary | ICD-10-CM | POA: Insufficient documentation

## 2021-07-22 DIAGNOSIS — H1033 Unspecified acute conjunctivitis, bilateral: Secondary | ICD-10-CM | POA: Insufficient documentation

## 2021-07-22 DIAGNOSIS — R5383 Other fatigue: Secondary | ICD-10-CM | POA: Diagnosis not present

## 2021-07-22 DIAGNOSIS — R1030 Lower abdominal pain, unspecified: Secondary | ICD-10-CM

## 2021-07-22 LAB — CBC
HCT: 39.2 % (ref 36.0–46.0)
Hemoglobin: 13.3 g/dL (ref 12.0–15.0)
MCH: 31.8 pg (ref 26.0–34.0)
MCHC: 33.9 g/dL (ref 30.0–36.0)
MCV: 93.8 fL (ref 80.0–100.0)
Platelets: 307 10*3/uL (ref 150–400)
RBC: 4.18 MIL/uL (ref 3.87–5.11)
RDW: 13.5 % (ref 11.5–15.5)
WBC: 13.8 10*3/uL — ABNORMAL HIGH (ref 4.0–10.5)
nRBC: 0 % (ref 0.0–0.2)

## 2021-07-22 LAB — POCT URINALYSIS DIPSTICK, ED / UC
Bilirubin Urine: NEGATIVE
Glucose, UA: NEGATIVE mg/dL
Ketones, ur: NEGATIVE mg/dL
Leukocytes,Ua: NEGATIVE
Nitrite: NEGATIVE
Protein, ur: NEGATIVE mg/dL
Specific Gravity, Urine: 1.015 (ref 1.005–1.030)
Urobilinogen, UA: 0.2 mg/dL (ref 0.0–1.0)
pH: 5 (ref 5.0–8.0)

## 2021-07-22 LAB — TSH: TSH: 2.182 u[IU]/mL (ref 0.350–4.500)

## 2021-07-22 LAB — BASIC METABOLIC PANEL
Anion gap: 9 (ref 5–15)
BUN: 27 mg/dL — ABNORMAL HIGH (ref 8–23)
CO2: 24 mmol/L (ref 22–32)
Calcium: 9.4 mg/dL (ref 8.9–10.3)
Chloride: 97 mmol/L — ABNORMAL LOW (ref 98–111)
Creatinine, Ser: 1.23 mg/dL — ABNORMAL HIGH (ref 0.44–1.00)
GFR, Estimated: 40 mL/min — ABNORMAL LOW (ref >60)
Glucose, Bld: 87 mg/dL (ref 70–99)
Potassium: 4.6 mmol/L (ref 3.5–5.1)
Sodium: 130 mmol/L — ABNORMAL LOW (ref 135–145)

## 2021-07-22 MED ORDER — GENTAMICIN SULFATE 0.3 % OP SOLN: 2.0000 [drp] | Freq: Three times a day (TID) | OPHTHALMIC | 0 refills | Status: AC

## 2021-07-22 NOTE — ED Triage Notes (Signed)
5 day h/o bilateral eye irritation and crusting and 2 days of intermittent abdominal pain. Daughter reports patient has been c/o fatigue. ?Negative at home covid test. One bout of diarrhea. No emesis. No meds taken. ?

## 2021-07-22 NOTE — Discharge Instructions (Addendum)
The urinalysis was overall normal, with a trace of hemoglobin in it.  That is often not of any significance, but a urine culture has been sent.  Staff will call you if it shows you need an antibiotic for a urinary infection ? ?The x-rays did not show any obvious specific problems in your abdomen.  There is some arthritis in your low back spine, and the radiologist noted some calcifications in your aorta, which also does not usually cause any abdominal pain ? ?Put gentamicin eyedrops in both eyes 3 times daily for 5 days ? ?Staff will call you if there are are any abnormalities on your blood work that is drawn today ? ?Tylenol is safe to take for your pain ?

## 2021-07-22 NOTE — ED Provider Notes (Signed)
?[place] ? ? ? ?CSN: [id number] ?Arrival date & time: 07/22/21  1225 ? ? ?  ? ?History   ?Chief Complaint ?Chief Complaint  ?Patient presents with  ? Eye Problem  ?  bilateral  ? Fatigue  ? ? ?HPI ?[name] is a 88 y.o. female.  ? ? ?Eye Problem ?Here for irritation in both eyes since March 27.  Also her left upper quadrant and right upper quadrant have been hurting intermittently.  The abdominal pain began about 3 days ago.  It is not been hurting very persistently, but she does mention some left upper quadrant pain happening right now.  She did have a loose stool on March 28 and then another one on March 29.  She has not had a bowel movement since.  No dysuria or hematuria noted.  No fever or chills. ? ?He has had a good bit of malaise in these last 5 days also.  She is eating, but daughter states she sometimes does not drink enough fluids. ? ?She is undergoing radiation for a squamous cell carcinoma on her right cheek onto the bridge of her nose ? ?Past Medical History:  ?Diagnosis Date  ? Acute pharyngitis   ? Acute sinusitis, unspecified   ? Acute upper respiratory infections of unspecified site   ? Allergy   ? Anal and rectal polyp   ? BCC (basal cell carcinoma) 05/18/2021  ? right nasal sidewall  ? Cataract   ? History of  ? Cavernous angioma   ? Degenerative cervical disc 02/22/2006  ? Diabetes mellitus   ? Type II  ? GERD (gastroesophageal reflux disease)   ? Gout   ? Hemangioma of unspecified site   ? Hyperlipidemia   ? Hypertension   ? Orthostatic hypotension 04/24/2006  ? Hospital  ? Osteoarthritis   ? Osteopenia   ? Other abscess of vulva   ? Palpitations   ? SCC (squamous cell carcinoma) 05/18/2021  ? left dorsal hand over MCP  ? Squamous cell carcinoma in situ (SCCIS) 05/18/2021  ? right lower leg medial  ? Syncope and collapse   ? 1 episode  ? ? ?Patient Active Problem List  ? Diagnosis Date Noted  ? Basal cell carcinoma of nose 06/14/2021  ? Tinea pedis of both feet 12/06/2020   ? Hip fracture ([place]) 11/01/2020  ? Closed displaced fracture of right femoral neck ([place]) 10/29/2020  ? Discharge of eye, left 10/29/2020  ? Elevated serum creatinine 10/29/2020  ? Sprain of anterior talofibular ligament of left ankle 06/22/2020  ? Acute cystitis without hematuria 05/10/2020  ? History of colonic polyps 04/28/2020  ? Urinary frequency 12/16/2019  ? Cough, persistent 11/06/2019  ? Acute neck pain 09/26/2019  ? Seborrheic dermatitis of scalp 09/26/2019  ? Nocturia 08/22/2019  ? Leukocytosis 06/13/2018  ? Seborrheic keratoses 10/16/2017  ? Hyponatremia 09/14/2017  ? Chronic left upper quadrant pain 08/07/2017  ? Right hip pain 07/12/2017  ? Hearing loss 02/20/2017  ? TIA (transient ischemic attack), possible 11/14/2016  ? Left carpal tunnel syndrome 08/11/2016  ? Fatigue 06/15/2016  ? Constipation, chronic 03/21/2016  ? Near syncope 05/07/2015  ? Osteoarthritis of right shoulder 05/04/2015  ? Counseling regarding end of life decision making 01/15/2015  ? Skin lesion of right leg 07/10/2014  ? Insomnia 04/10/2014  ? Shakiness 12/16/2013  ? Osteoarthritis of right knee 10/14/2013  ? Osteoarthritis of left knee 10/14/2013  ? Infected sebaceous cyst 11/29/2012  ? Gout, chronic, without tophus 12/13/2009  ?  Osteoporosis 10/29/2009  ? PALPITATIONS, OCCASIONAL 07/07/2008  ? Allergic rhinitis 06/02/2008  ? HEMANGIOMA 07/30/2006  ? Controlled type 2 diabetes mellitus without complication, without long-term current use of insulin ([place]) 07/30/2006  ? Hyperlipidemia LDL goal <100 07/30/2006  ? Essential hypertension, benign 07/30/2006  ? RECTAL POLYPS 07/30/2006  ? Osteoarthritis 07/30/2006  ? CATARACT, HX OF 07/30/2006  ? ? ?Past Surgical History:  ?Procedure Laterality Date  ? ABDOMINAL HYSTERECTOMY    ? Aortic dopplers    ? Mild plaque.  EEG okay  ? CATARACT EXTRACTION    ? Cavernous angioma    ? Cavernous hemangioma  04/2004  ? Hospital  ? TOTAL HIP ARTHROPLASTY Right 11/01/2020  ? Procedure: TOTAL HIP  ARTHROPLASTY ANTERIOR APPROACH;  Surgeon: Gaynelle Arabian, MD;  Location: [facility];  Service: Orthopedics;  Laterality: Right;  ? TOTAL KNEE ARTHROPLASTY    ? Right  ? ? ?OB History   ?No obstetric history on file. ?  ? ? ? ?Home Medications   ? ?Prior to Admission medications   ?Medication Sig Start Date End Date Taking? Authorizing Provider  ?gentamicin (GARAMYCIN) 0.3 % ophthalmic solution Place 2 drops into both eyes 3 (three) times daily for 5 days. 07/22/21 07/27/21 Yes Barrett Henle, MD  ?Accu-Chek Softclix Lancets lancets CHECK BLOOD SUGAR 1 TIME A DAY. DX: E11.9 03/22/20   Bedsole, Amy E, MD  ?Blood Glucose Monitoring Suppl (ACCU-CHEK GUIDE) w/Device KIT 1 each by Does not apply route daily. Use to check blood sugar daily as needed.  Dx: E11.9 03/22/20   Bedsole, Amy E, MD  ?fluticasone (FLONASE) 50 MCG/ACT nasal spray PLACE 2 SPRAYS INTO BOTH NOSTRILS DAILY AS NEEDED FOR ALLERGIES. 06/10/20   Bedsole, Amy E, MD  ?glucose blood (ACCU-CHEK GUIDE) test strip Use to check blood sugar 1 time daily 03/22/20   Eliezer Lofts E, MD  ?mupirocin ointment (BACTROBAN) 2 % Apply twice daily to affected areas until healed 05/18/21   Moye, [place], MD  ?mupirocin ointment (BACTROBAN) 2 % Apply 1 application topically daily. Apply to open wounds at left hand and right lower leg until healed 06/22/21   Moye, [place], MD  ?MYRBETRIQ 25 MG TB24 tablet Take 25 mg by mouth daily. 05/05/21   [provider]  ?Propylene Glycol (SYSTANE BALANCE) 0.6 % SOLN Place 1-2 drops into both eyes daily.    [provider]  ?ramipril (ALTACE) 5 MG capsule TAKE 1 CAPSULE BY MOUTH EVERY DAY 12/03/20   Bedsole, Amy E, MD  ?TYLENOL 500 MG tablet Take 1,000 mg by mouth 2 (two) times daily. 05/11/21   [provider]  ? ? ?Family History ?Family History  ?Problem Relation Age of Onset  ? Heart disease Other   ?     CHF  ? Cancer Maternal Aunt   ?     Breast CA  ? ? ?Social History ?Social History  ? ?Tobacco Use  ? Smoking  status: Former  ? Smokeless tobacco: Never  ? Tobacco comments:  ?  quit 1970  ?Vaping Use  ? Vaping Use: Never used  ?Substance Use Topics  ? Alcohol use: Not Currently  ?  Comment: wine rarely  ? Drug use: No  ? ? ? ?Allergies   ?Codeine, Other, and Statins ? ? ?Review of Systems ?Review of Systems ? ? ?Physical Exam ?Triage Vital Signs ?ED Triage Vitals  ?Enc Vitals Group  ?   BP 07/22/21 1305 136/86  ?   Pulse Rate 07/22/21 1305 73  ?  Resp 07/22/21 1305 16  ?   Temp 07/22/21 1305 97.6 ?F (36.4 ?C)  ?   Temp Source 07/22/21 1305 Oral  ?   SpO2 07/22/21 1305 97 %  ?   Weight --   ?   Height --   ?   Head Circumference --   ?   Peak Flow --   ?   Pain Score 07/22/21 1302 0  ?   Pain Loc --   ?   Pain Edu? --   ?   Excl. in [place]? --   ? ?No data found. ? ?Updated Vital Signs ?BP 136/86 (BP Location: Right Arm)   Pulse 73   Temp 97.6 ?F (36.4 ?C) (Oral)   Resp 16   SpO2 97%  ? ?Visual Acuity ?Right Eye Distance:   ?Left Eye Distance:   ?Bilateral Distance:   ? ?Right Eye Near:   ?Left Eye Near:    ?Bilateral Near:    ? ?Physical Exam ?Vitals reviewed.  ?Constitutional:   ?   General: She is not in acute distress. ?   Appearance: She is not ill-appearing, toxic-appearing or diaphoretic.  ?HENT:  ?   Mouth/Throat:  ?   Mouth: Mucous membranes are moist.  ?Eyes:  ?   Extraocular Movements: Extraocular movements intact.  ?   Pupils: Pupils are equal, round, and reactive to light.  ?   Comments: Bilaterally the conjunctiva are not really injected, but there is a good bit of dried discharge on her eyelashes and lids  ?Cardiovascular:  ?   Rate and Rhythm: Normal rate and regular rhythm.  ?Pulmonary:  ?   Effort: Pulmonary effort is normal.  ?   Breath sounds: Normal breath sounds. No stridor. No wheezing, rhonchi or rales.  ?Abdominal:  ?   General: There is no distension.  ?   Palpations: Abdomen is soft. There is no mass.  ?   Tenderness: There is abdominal tenderness (In the epigastrium and left upper quadrant.,   And tenderness is mild). There is no guarding.  ?Musculoskeletal:  ?   Cervical back: No tenderness.  ?Lymphadenopathy:  ?   Cervical: No cervical adenopathy.  ?Skin: ?   Coloration: Skin is not pale.  ?   Comments: Ther

## 2021-07-23 ENCOUNTER — Encounter (HOSPITAL_COMMUNITY): Payer: Self-pay | Admitting: Family Medicine

## 2021-07-23 LAB — URINE CULTURE: Culture: NO GROWTH

## 2021-07-23 NOTE — Progress Notes (Signed)
Spoke with Pt?s daughter, Rejeana Brock. Verified identity, and gave her results of labs. States her mom is feeling better today.

## 2021-07-25 ENCOUNTER — Ambulatory Visit
Admission: RE | Admit: 2021-07-25 | Discharge: 2021-07-25 | Disposition: A | Discharge status: Home / Self Care | Payer: Medicare Other | Source: Ambulatory Visit | Attending: Radiation Oncology | Admitting: Radiation Oncology

## 2021-07-25 ENCOUNTER — Other Ambulatory Visit: Payer: Self-pay

## 2021-07-25 DIAGNOSIS — D485 Neoplasm of uncertain behavior of skin: Secondary | ICD-10-CM | POA: Diagnosis not present

## 2021-07-25 DIAGNOSIS — I129 Hypertensive chronic kidney disease with stage 1 through stage 4 chronic kidney disease, or unspecified chronic kidney disease: Secondary | ICD-10-CM | POA: Diagnosis not present

## 2021-07-25 DIAGNOSIS — E1122 Type 2 diabetes mellitus with diabetic chronic kidney disease: Secondary | ICD-10-CM | POA: Diagnosis not present

## 2021-07-25 DIAGNOSIS — C44311 Basal cell carcinoma of skin of nose: Secondary | ICD-10-CM | POA: Diagnosis not present

## 2021-07-25 DIAGNOSIS — Z483 Aftercare following surgery for neoplasm: Secondary | ICD-10-CM | POA: Diagnosis not present

## 2021-07-25 DIAGNOSIS — Z4801 Encounter for change or removal of surgical wound dressing: Secondary | ICD-10-CM | POA: Diagnosis not present

## 2021-07-25 DIAGNOSIS — N189 Chronic kidney disease, unspecified: Secondary | ICD-10-CM | POA: Diagnosis not present

## 2021-07-27 DIAGNOSIS — Z483 Aftercare following surgery for neoplasm: Secondary | ICD-10-CM | POA: Diagnosis not present

## 2021-07-27 DIAGNOSIS — I129 Hypertensive chronic kidney disease with stage 1 through stage 4 chronic kidney disease, or unspecified chronic kidney disease: Secondary | ICD-10-CM | POA: Diagnosis not present

## 2021-07-27 DIAGNOSIS — Z4801 Encounter for change or removal of surgical wound dressing: Secondary | ICD-10-CM | POA: Diagnosis not present

## 2021-07-27 DIAGNOSIS — E1122 Type 2 diabetes mellitus with diabetic chronic kidney disease: Secondary | ICD-10-CM | POA: Diagnosis not present

## 2021-07-27 DIAGNOSIS — D485 Neoplasm of uncertain behavior of skin: Secondary | ICD-10-CM | POA: Diagnosis not present

## 2021-07-27 DIAGNOSIS — N189 Chronic kidney disease, unspecified: Secondary | ICD-10-CM | POA: Diagnosis not present

## 2021-07-29 ENCOUNTER — Encounter: Payer: Self-pay | Admitting: Radiation Oncology

## 2021-07-29 ENCOUNTER — Other Ambulatory Visit: Payer: Self-pay

## 2021-07-29 ENCOUNTER — Ambulatory Visit
Admission: RE | Admit: 2021-07-29 | Discharge: 2021-07-29 | Disposition: A | Discharge status: Home / Self Care | Payer: Medicare Other | Source: Ambulatory Visit | Attending: Radiation Oncology | Admitting: Radiation Oncology

## 2021-07-29 DIAGNOSIS — D485 Neoplasm of uncertain behavior of skin: Secondary | ICD-10-CM | POA: Diagnosis not present

## 2021-07-29 DIAGNOSIS — C44311 Basal cell carcinoma of skin of nose: Secondary | ICD-10-CM | POA: Diagnosis not present

## 2021-07-29 DIAGNOSIS — Z483 Aftercare following surgery for neoplasm: Secondary | ICD-10-CM | POA: Diagnosis not present

## 2021-07-29 DIAGNOSIS — I129 Hypertensive chronic kidney disease with stage 1 through stage 4 chronic kidney disease, or unspecified chronic kidney disease: Secondary | ICD-10-CM | POA: Diagnosis not present

## 2021-07-29 DIAGNOSIS — N189 Chronic kidney disease, unspecified: Secondary | ICD-10-CM | POA: Diagnosis not present

## 2021-07-29 DIAGNOSIS — Z4801 Encounter for change or removal of surgical wound dressing: Secondary | ICD-10-CM | POA: Diagnosis not present

## 2021-07-29 DIAGNOSIS — Z510 Encounter for antineoplastic radiation therapy: Secondary | ICD-10-CM | POA: Diagnosis not present

## 2021-07-29 DIAGNOSIS — E1122 Type 2 diabetes mellitus with diabetic chronic kidney disease: Secondary | ICD-10-CM | POA: Diagnosis not present

## 2021-07-30 DIAGNOSIS — L219 Seborrheic dermatitis, unspecified: Secondary | ICD-10-CM | POA: Diagnosis not present

## 2021-07-30 DIAGNOSIS — N189 Chronic kidney disease, unspecified: Secondary | ICD-10-CM | POA: Diagnosis not present

## 2021-07-30 DIAGNOSIS — H5789 Other specified disorders of eye and adnexa: Secondary | ICD-10-CM | POA: Diagnosis not present

## 2021-07-30 DIAGNOSIS — N3281 Overactive bladder: Secondary | ICD-10-CM | POA: Diagnosis not present

## 2021-07-30 DIAGNOSIS — R4189 Other symptoms and signs involving cognitive functions and awareness: Secondary | ICD-10-CM | POA: Diagnosis not present

## 2021-07-30 DIAGNOSIS — Z993 Dependence on wheelchair: Secondary | ICD-10-CM | POA: Diagnosis not present

## 2021-07-30 DIAGNOSIS — M810 Age-related osteoporosis without current pathological fracture: Secondary | ICD-10-CM | POA: Diagnosis not present

## 2021-07-30 DIAGNOSIS — B353 Tinea pedis: Secondary | ICD-10-CM | POA: Diagnosis not present

## 2021-07-30 DIAGNOSIS — L570 Actinic keratosis: Secondary | ICD-10-CM | POA: Diagnosis not present

## 2021-07-30 DIAGNOSIS — I129 Hypertensive chronic kidney disease with stage 1 through stage 4 chronic kidney disease, or unspecified chronic kidney disease: Secondary | ICD-10-CM | POA: Diagnosis not present

## 2021-07-30 DIAGNOSIS — Z7409 Other reduced mobility: Secondary | ICD-10-CM | POA: Diagnosis not present

## 2021-07-30 DIAGNOSIS — D485 Neoplasm of uncertain behavior of skin: Secondary | ICD-10-CM | POA: Diagnosis not present

## 2021-07-30 DIAGNOSIS — Z87891 Personal history of nicotine dependence: Secondary | ICD-10-CM | POA: Diagnosis not present

## 2021-07-30 DIAGNOSIS — G8929 Other chronic pain: Secondary | ICD-10-CM | POA: Diagnosis not present

## 2021-07-30 DIAGNOSIS — Z4801 Encounter for change or removal of surgical wound dressing: Secondary | ICD-10-CM | POA: Diagnosis not present

## 2021-07-30 DIAGNOSIS — D72829 Elevated white blood cell count, unspecified: Secondary | ICD-10-CM | POA: Diagnosis not present

## 2021-07-30 DIAGNOSIS — Z7982 Long term (current) use of aspirin: Secondary | ICD-10-CM | POA: Diagnosis not present

## 2021-07-30 DIAGNOSIS — M542 Cervicalgia: Secondary | ICD-10-CM | POA: Diagnosis not present

## 2021-07-30 DIAGNOSIS — E1122 Type 2 diabetes mellitus with diabetic chronic kidney disease: Secondary | ICD-10-CM | POA: Diagnosis not present

## 2021-07-30 DIAGNOSIS — Z79891 Long term (current) use of opiate analgesic: Secondary | ICD-10-CM | POA: Diagnosis not present

## 2021-07-30 DIAGNOSIS — M25551 Pain in right hip: Secondary | ICD-10-CM | POA: Diagnosis not present

## 2021-07-30 DIAGNOSIS — N3000 Acute cystitis without hematuria: Secondary | ICD-10-CM | POA: Diagnosis not present

## 2021-07-30 DIAGNOSIS — M170 Bilateral primary osteoarthritis of knee: Secondary | ICD-10-CM | POA: Diagnosis not present

## 2021-07-30 DIAGNOSIS — Z9181 History of falling: Secondary | ICD-10-CM | POA: Diagnosis not present

## 2021-07-30 DIAGNOSIS — Z483 Aftercare following surgery for neoplasm: Secondary | ICD-10-CM | POA: Diagnosis not present

## 2021-08-01 DIAGNOSIS — Z483 Aftercare following surgery for neoplasm: Secondary | ICD-10-CM | POA: Diagnosis not present

## 2021-08-01 DIAGNOSIS — N189 Chronic kidney disease, unspecified: Secondary | ICD-10-CM | POA: Diagnosis not present

## 2021-08-01 DIAGNOSIS — Z4801 Encounter for change or removal of surgical wound dressing: Secondary | ICD-10-CM | POA: Diagnosis not present

## 2021-08-01 DIAGNOSIS — D485 Neoplasm of uncertain behavior of skin: Secondary | ICD-10-CM | POA: Diagnosis not present

## 2021-08-01 DIAGNOSIS — N1831 Chronic kidney disease, stage 3a: Secondary | ICD-10-CM | POA: Diagnosis not present

## 2021-08-01 DIAGNOSIS — I129 Hypertensive chronic kidney disease with stage 1 through stage 4 chronic kidney disease, or unspecified chronic kidney disease: Secondary | ICD-10-CM | POA: Diagnosis not present

## 2021-08-01 DIAGNOSIS — K5901 Slow transit constipation: Secondary | ICD-10-CM | POA: Diagnosis not present

## 2021-08-01 DIAGNOSIS — E1122 Type 2 diabetes mellitus with diabetic chronic kidney disease: Secondary | ICD-10-CM | POA: Diagnosis not present

## 2021-08-03 DIAGNOSIS — D485 Neoplasm of uncertain behavior of skin: Secondary | ICD-10-CM | POA: Diagnosis not present

## 2021-08-03 DIAGNOSIS — Z4801 Encounter for change or removal of surgical wound dressing: Secondary | ICD-10-CM | POA: Diagnosis not present

## 2021-08-03 DIAGNOSIS — N189 Chronic kidney disease, unspecified: Secondary | ICD-10-CM | POA: Diagnosis not present

## 2021-08-03 DIAGNOSIS — Z483 Aftercare following surgery for neoplasm: Secondary | ICD-10-CM | POA: Diagnosis not present

## 2021-08-03 DIAGNOSIS — I129 Hypertensive chronic kidney disease with stage 1 through stage 4 chronic kidney disease, or unspecified chronic kidney disease: Secondary | ICD-10-CM | POA: Diagnosis not present

## 2021-08-03 DIAGNOSIS — E1122 Type 2 diabetes mellitus with diabetic chronic kidney disease: Secondary | ICD-10-CM | POA: Diagnosis not present

## 2021-08-05 DIAGNOSIS — E1122 Type 2 diabetes mellitus with diabetic chronic kidney disease: Secondary | ICD-10-CM | POA: Diagnosis not present

## 2021-08-05 DIAGNOSIS — Z4801 Encounter for change or removal of surgical wound dressing: Secondary | ICD-10-CM | POA: Diagnosis not present

## 2021-08-05 DIAGNOSIS — N189 Chronic kidney disease, unspecified: Secondary | ICD-10-CM | POA: Diagnosis not present

## 2021-08-05 DIAGNOSIS — D485 Neoplasm of uncertain behavior of skin: Secondary | ICD-10-CM | POA: Diagnosis not present

## 2021-08-05 DIAGNOSIS — Z483 Aftercare following surgery for neoplasm: Secondary | ICD-10-CM | POA: Diagnosis not present

## 2021-08-05 DIAGNOSIS — I129 Hypertensive chronic kidney disease with stage 1 through stage 4 chronic kidney disease, or unspecified chronic kidney disease: Secondary | ICD-10-CM | POA: Diagnosis not present

## 2021-08-08 DIAGNOSIS — N189 Chronic kidney disease, unspecified: Secondary | ICD-10-CM | POA: Diagnosis not present

## 2021-08-08 DIAGNOSIS — I129 Hypertensive chronic kidney disease with stage 1 through stage 4 chronic kidney disease, or unspecified chronic kidney disease: Secondary | ICD-10-CM | POA: Diagnosis not present

## 2021-08-08 DIAGNOSIS — E1122 Type 2 diabetes mellitus with diabetic chronic kidney disease: Secondary | ICD-10-CM | POA: Diagnosis not present

## 2021-08-08 DIAGNOSIS — Z483 Aftercare following surgery for neoplasm: Secondary | ICD-10-CM | POA: Diagnosis not present

## 2021-08-08 DIAGNOSIS — Z4801 Encounter for change or removal of surgical wound dressing: Secondary | ICD-10-CM | POA: Diagnosis not present

## 2021-08-08 DIAGNOSIS — D485 Neoplasm of uncertain behavior of skin: Secondary | ICD-10-CM | POA: Diagnosis not present

## 2021-08-10 DIAGNOSIS — D485 Neoplasm of uncertain behavior of skin: Secondary | ICD-10-CM | POA: Diagnosis not present

## 2021-08-10 DIAGNOSIS — I129 Hypertensive chronic kidney disease with stage 1 through stage 4 chronic kidney disease, or unspecified chronic kidney disease: Secondary | ICD-10-CM | POA: Diagnosis not present

## 2021-08-10 DIAGNOSIS — Z4801 Encounter for change or removal of surgical wound dressing: Secondary | ICD-10-CM | POA: Diagnosis not present

## 2021-08-10 DIAGNOSIS — N189 Chronic kidney disease, unspecified: Secondary | ICD-10-CM | POA: Diagnosis not present

## 2021-08-10 DIAGNOSIS — E1122 Type 2 diabetes mellitus with diabetic chronic kidney disease: Secondary | ICD-10-CM | POA: Diagnosis not present

## 2021-08-10 DIAGNOSIS — Z483 Aftercare following surgery for neoplasm: Secondary | ICD-10-CM | POA: Diagnosis not present

## 2021-08-12 DIAGNOSIS — D485 Neoplasm of uncertain behavior of skin: Secondary | ICD-10-CM | POA: Diagnosis not present

## 2021-08-12 DIAGNOSIS — Z483 Aftercare following surgery for neoplasm: Secondary | ICD-10-CM | POA: Diagnosis not present

## 2021-08-12 DIAGNOSIS — Z4801 Encounter for change or removal of surgical wound dressing: Secondary | ICD-10-CM | POA: Diagnosis not present

## 2021-08-12 DIAGNOSIS — E1122 Type 2 diabetes mellitus with diabetic chronic kidney disease: Secondary | ICD-10-CM | POA: Diagnosis not present

## 2021-08-12 DIAGNOSIS — N189 Chronic kidney disease, unspecified: Secondary | ICD-10-CM | POA: Diagnosis not present

## 2021-08-12 DIAGNOSIS — I129 Hypertensive chronic kidney disease with stage 1 through stage 4 chronic kidney disease, or unspecified chronic kidney disease: Secondary | ICD-10-CM | POA: Diagnosis not present

## 2021-08-15 DIAGNOSIS — Z4801 Encounter for change or removal of surgical wound dressing: Secondary | ICD-10-CM | POA: Diagnosis not present

## 2021-08-15 DIAGNOSIS — Z483 Aftercare following surgery for neoplasm: Secondary | ICD-10-CM | POA: Diagnosis not present

## 2021-08-15 DIAGNOSIS — N189 Chronic kidney disease, unspecified: Secondary | ICD-10-CM | POA: Diagnosis not present

## 2021-08-15 DIAGNOSIS — I129 Hypertensive chronic kidney disease with stage 1 through stage 4 chronic kidney disease, or unspecified chronic kidney disease: Secondary | ICD-10-CM | POA: Diagnosis not present

## 2021-08-15 DIAGNOSIS — E1122 Type 2 diabetes mellitus with diabetic chronic kidney disease: Secondary | ICD-10-CM | POA: Diagnosis not present

## 2021-08-15 DIAGNOSIS — D485 Neoplasm of uncertain behavior of skin: Secondary | ICD-10-CM | POA: Diagnosis not present

## 2021-08-17 DIAGNOSIS — D485 Neoplasm of uncertain behavior of skin: Secondary | ICD-10-CM | POA: Diagnosis not present

## 2021-08-17 DIAGNOSIS — N189 Chronic kidney disease, unspecified: Secondary | ICD-10-CM | POA: Diagnosis not present

## 2021-08-17 DIAGNOSIS — Z4801 Encounter for change or removal of surgical wound dressing: Secondary | ICD-10-CM | POA: Diagnosis not present

## 2021-08-17 DIAGNOSIS — E1122 Type 2 diabetes mellitus with diabetic chronic kidney disease: Secondary | ICD-10-CM | POA: Diagnosis not present

## 2021-08-17 DIAGNOSIS — I129 Hypertensive chronic kidney disease with stage 1 through stage 4 chronic kidney disease, or unspecified chronic kidney disease: Secondary | ICD-10-CM | POA: Diagnosis not present

## 2021-08-17 DIAGNOSIS — Z483 Aftercare following surgery for neoplasm: Secondary | ICD-10-CM | POA: Diagnosis not present

## 2021-08-19 DIAGNOSIS — D485 Neoplasm of uncertain behavior of skin: Secondary | ICD-10-CM | POA: Diagnosis not present

## 2021-08-19 DIAGNOSIS — E1122 Type 2 diabetes mellitus with diabetic chronic kidney disease: Secondary | ICD-10-CM | POA: Diagnosis not present

## 2021-08-19 DIAGNOSIS — Z4801 Encounter for change or removal of surgical wound dressing: Secondary | ICD-10-CM | POA: Diagnosis not present

## 2021-08-19 DIAGNOSIS — I129 Hypertensive chronic kidney disease with stage 1 through stage 4 chronic kidney disease, or unspecified chronic kidney disease: Secondary | ICD-10-CM | POA: Diagnosis not present

## 2021-08-19 DIAGNOSIS — Z483 Aftercare following surgery for neoplasm: Secondary | ICD-10-CM | POA: Diagnosis not present

## 2021-08-19 DIAGNOSIS — N189 Chronic kidney disease, unspecified: Secondary | ICD-10-CM | POA: Diagnosis not present

## 2021-08-22 ENCOUNTER — Ambulatory Visit (INDEPENDENT_AMBULATORY_CARE_PROVIDER_SITE_OTHER): Payer: Medicare Other | Admitting: Dermatology

## 2021-08-22 DIAGNOSIS — D485 Neoplasm of uncertain behavior of skin: Secondary | ICD-10-CM | POA: Diagnosis not present

## 2021-08-22 DIAGNOSIS — L821 Other seborrheic keratosis: Secondary | ICD-10-CM

## 2021-08-22 DIAGNOSIS — K5901 Slow transit constipation: Secondary | ICD-10-CM | POA: Diagnosis not present

## 2021-08-22 DIAGNOSIS — N189 Chronic kidney disease, unspecified: Secondary | ICD-10-CM | POA: Diagnosis not present

## 2021-08-22 DIAGNOSIS — D692 Other nonthrombocytopenic purpura: Secondary | ICD-10-CM

## 2021-08-22 DIAGNOSIS — Z85828 Personal history of other malignant neoplasm of skin: Secondary | ICD-10-CM

## 2021-08-22 DIAGNOSIS — L578 Other skin changes due to chronic exposure to nonionizing radiation: Secondary | ICD-10-CM | POA: Diagnosis not present

## 2021-08-22 DIAGNOSIS — N1831 Chronic kidney disease, stage 3a: Secondary | ICD-10-CM | POA: Diagnosis not present

## 2021-08-22 DIAGNOSIS — L570 Actinic keratosis: Secondary | ICD-10-CM

## 2021-08-22 DIAGNOSIS — Z4801 Encounter for change or removal of surgical wound dressing: Secondary | ICD-10-CM | POA: Diagnosis not present

## 2021-08-22 DIAGNOSIS — E1122 Type 2 diabetes mellitus with diabetic chronic kidney disease: Secondary | ICD-10-CM | POA: Diagnosis not present

## 2021-08-22 DIAGNOSIS — Z483 Aftercare following surgery for neoplasm: Secondary | ICD-10-CM | POA: Diagnosis not present

## 2021-08-22 DIAGNOSIS — I129 Hypertensive chronic kidney disease with stage 1 through stage 4 chronic kidney disease, or unspecified chronic kidney disease: Secondary | ICD-10-CM | POA: Diagnosis not present

## 2021-08-22 NOTE — Patient Instructions (Addendum)
Continue wound care at right lower leg.  ? ?Cryotherapy Aftercare ? ?Wash gently with soap and water everyday.   ?Apply Vaseline and Band-Aid daily until healed.  ? ? ?If You Need Anything After Your Visit ? ?If you have any questions or concerns for your doctor, please call our main line at [phone] and press option 4 to reach your doctor's medical assistant. If no one answers, please leave a voicemail as directed and we will return your call as soon as possible. Messages left after 4 pm will be answered the following business day.  ? ?You may also send [place] a message via MyChart. We typically respond to MyChart messages within 1-2 business days. ? ?For prescription refills, please ask your pharmacy to contact our office. Our fax number is [phone]. ? ?If you have an urgent issue when the clinic is closed that cannot wait until the next business day, you can page your doctor at the number below.   ? ?Please note that while we do our best to be available for urgent issues outside of office hours, we are not available 24/7.  ? ?If you have an urgent issue and are unable to reach [place], you may choose to seek medical care at your doctor's office, retail clinic, urgent care center, or emergency room. ? ?If you have a medical emergency, please immediately call 911 or go to the emergency department. ? ?Pager Numbers ? ?- Dr. Nehemiah Massed: [phone] ? ?- Dr. Laurence Ferrari: [phone] ? ?- Dr. Nicole Kindred: [phone] ? ?In the event of inclement weather, please call our main line at [phone] for an update on the status of any delays or closures. ? ?Dermatology Medication Tips: ?Please keep the boxes that topical medications come in in order to help keep track of the instructions about where and how to use these. Pharmacies typically print the medication instructions only on the boxes and not directly on the medication tubes.  ? ?If your medication is too expensive, please contact our office at [phone] option 4 or send [place]  a message through [place].  ? ?We are unable to tell what your co-pay for medications will be in advance as this is different depending on your insurance coverage. However, we may be able to find a substitute medication at lower cost or fill out paperwork to get insurance to cover a needed medication.  ? ?If a prior authorization is required to get your medication covered by your insurance company, please allow [place] 1-2 business days to complete this process. ? ?Drug prices often vary depending on where the prescription is filled and some pharmacies may offer cheaper prices. ? ?The website www.goodrx.com contains coupons for medications through different pharmacies. The prices here do not account for what the cost may be with help from insurance (it may be cheaper with your insurance), but the website can give you the price if you did not use any insurance.  ?- You can print the associated coupon and take it with your prescription to the pharmacy.  ?- You may also stop by our office during regular business hours and pick up a GoodRx coupon card.  ?- If you need your prescription sent electronically to a different pharmacy, notify our office through [facility] or by phone at [phone] option 4. ? ? ? ? ?Si Usted Necesita Algo Despu?s de Su Visita ? ?Tambi?n puede enviarnos un mensaje a trav?s de MyChart. Por lo general respondemos a los mensajes de MyChart en el transcurso de 1 a  2 d?as h?biles. ? ?Para renovar recetas, por favor pida a su farmacia que se ponga en contacto con nuestra oficina. Nuestro n?mero de fax es el [phone]. ? ?Si tiene un asunto urgente cuando la cl?nica est? cerrada y que no puede esperar hasta el siguiente d?a h?bil, puede llamar/localizar a su doctor(a) al n?mero que aparece a continuaci?n.  ? ?Por favor, tenga en cuenta que aunque hacemos todo lo posible para estar disponibles para asuntos urgentes fuera del horario de oficina, no estamos disponibles las 24 horas del d?a, los  7 d?as de la semana.  ? ?Si tiene un problema urgente y no puede comunicarse con nosotros, puede optar por buscar atenci?n m?dica  en el consultorio de su doctor(a), en una cl?nica privada, en un centro de atenci?n urgente o en una sala de emergencias. ? ?Si tiene Engineer, maintenance (IT) m?dica, por favor llame inmediatamente al 911 o vaya a la sala de emergencias. ? ?N?meros de b?per ? ?- Dr. Nehemiah Massed: [phone] ? ?- Dra. Moye: [phone] ? ?- Dra. Nicole Kindred: [phone] ? ?En caso de inclemencias del tiempo, por favor llame a nuestra l?nea principal al [phone] para una actualizaci?n sobre el estado de cualquier retraso o cierre. ? ?Consejos para la medicaci?n en dermatolog?a: ?Por favor, guarde las cajas en las que vienen los medicamentos de uso t?pico para ayudarle a seguir las instrucciones sobre d?nde y c?mo usarlos. Las farmacias generalmente imprimen las instrucciones del medicamento s?lo en las cajas y no directamente en los tubos del [place].  ? ?Si su medicamento es muy caro, por favor, p?ngase en contacto con Zigmund Daniel llamando al [phone] y presione la opci?n 4 o env?enos un mensaje a trav?s de MyChart.  ? ?No podemos decirle cu?l ser? su copago por los medicamentos por adelantado ya que esto es diferente dependiendo de la cobertura de su seguro. Sin embargo, es posible que podamos encontrar un medicamento sustituto a Electrical engineer un formulario para que el seguro cubra el medicamento que se considera necesario.  ? ?Si se requiere Ardelia Mems autorizaci?n previa para que su compa??a de seguros [place] su medicamento, por favor perm?tanos de 1 a 2 d?as h?biles para completar este proceso. ? ?Los precios de los medicamentos var?an con frecuencia dependiendo del Environmental consultant de d?nde se surte la receta y alguna farmacias pueden ofrecer precios m?s baratos. ? ?El sitio web www.goodrx.com tiene cupones para medicamentos de Airline pilot. Los precios aqu? no tienen en cuenta lo que podr?a costar con  la ayuda del seguro (puede ser m?s barato con su seguro), pero el sitio web puede darle el precio si no utiliz? ning?n seguro.  ?- Puede imprimir el cup?n correspondiente y llevarlo con su receta a la farmacia.  ?- Tambi?n puede pasar por nuestra oficina durante el horario de atenci?n regular y recoger una tarjeta de cupones de GoodRx.  ?- Si necesita que su receta se env?e electr?nicamente a Chiropodist, informe a nuestra oficina a trav?s de MyChart de [place] o por tel?fono llamando al [phone] y presione la opci?n 4. ? ?

## 2021-08-22 NOTE — Progress Notes (Signed)
? ?Follow-Up Visit ?  ?Subjective  ?[name] is a 88 y.o. female who presents for the following: Follow-up (Patient here today for AK follow up. AK's treated at last visit with LN2 at Left Dorsal Hand x3, right dorsal hand x1. Patient also with hx of SCC at Left dorsal hand over mcp and right lower leg medial treated with EDC. Patient advises she does have a few other spots she would like to point out, not sore but she picks at.). ? ?Patient accompanied by daughter who contributes to history. ? ?The following portions of the chart were reviewed this encounter and updated as appropriate:  ?  ?  ? ?Review of Systems:  No other skin or systemic complaints except as noted in HPI or Assessment and Plan. ? ?Objective  ?Well appearing patient in no apparent distress; mood and affect are within normal limits. ? ?A focused examination was performed including hands, arms, legs, face, neck. Relevant physical exam findings are noted in the Assessment and Plan. ? ?right forearm x 6, right wrist x 1, right hand x 2 (9) ?Keratotic papules ? ?right medial lower leg ?Stuck-on, waxy, tan-brown papule -- Discussed benign etiology and prognosis.  ? ? ? ?Assessment & Plan  ?AK (actinic keratosis) (9) ?right forearm x 6, right wrist x 1, right hand x 2 ? ?Hypertrophic  ? ?Destruction of lesion - right forearm x 6, right wrist x 1, right hand x 2 ? ?Destruction method: cryotherapy   ?Informed consent: discussed and consent obtained   ?Lesion destroyed using liquid nitrogen: Yes   ?Region frozen until ice ball extended beyond lesion: Yes   ?Outcome: patient tolerated procedure well with no complications   ?Post-procedure details: wound care instructions given   ?Additional details:  Prior to procedure, discussed risks of blister formation, small wound, skin dyspigmentation, or rare scar following cryotherapy. Recommend Vaseline ointment to treated areas while healing.  ? ?Seborrheic keratosis ?right medial lower leg ? ?Reassured  benign age-related growth.  Recommend observation.  Discussed cryotherapy if spot(s) become irritated or inflamed.  ? ? ?History of Squamous Cell Carcinoma in Situ of the Skin ?- No evidence of recurrence today at right lower leg medial ?- Recommend regular full body skin exams ?- Recommend daily broad spectrum sunscreen SPF 30+ to sun-exposed areas, reapply every 2 hours as needed.  ?- Call if any new or changing lesions are noted between office visits ? ?History of Squamous Cell Carcinoma of the Skin ?- No evidence of recurrence today at left dorsal hand over MCP ?- Recommend regular full body skin exams ?- Recommend daily broad spectrum sunscreen SPF 30+ to sun-exposed areas, reapply every 2 hours as needed.  ?- Call if any new or changing lesions are noted between office visits ? ?Purpura - Chronic; persistent and recurrent.  Treatable, but not curable. ?- Violaceous macules and patches ?- Benign ?- Related to trauma, age, sun damage and/or use of blood thinners, chronic use of topical and/or oral steroids ?- Observe ?- Can use OTC arnica containing moisturizer such as Dermend Bruise Formula if desired ?- Call for worsening or other concerns ? ?Actinic Damage ?- chronic, secondary to cumulative UV radiation exposure/sun exposure over time ?- diffuse scaly erythematous macules with underlying dyspigmentation ?- Recommend daily broad spectrum sunscreen SPF 30+ to sun-exposed areas, reapply every 2 hours as needed.  ?- Recommend staying in the shade or wearing long sleeves, sun glasses (UVA+UVB protection) and wide brim hats (4-inch brim around the entire circumference  of the hat). ?- Call for new or changing lesions.  ? ?Return for as scheduled. ? ?Graciella Belton, RMA, am acting as scribe for Brendolyn Patty, MD . ? ?Documentation: I have reviewed the above documentation for accuracy and completeness, and I agree with the above. ? ?Brendolyn Patty MD  ? ?

## 2021-08-24 DIAGNOSIS — E1122 Type 2 diabetes mellitus with diabetic chronic kidney disease: Secondary | ICD-10-CM | POA: Diagnosis not present

## 2021-08-24 DIAGNOSIS — N189 Chronic kidney disease, unspecified: Secondary | ICD-10-CM | POA: Diagnosis not present

## 2021-08-24 DIAGNOSIS — I129 Hypertensive chronic kidney disease with stage 1 through stage 4 chronic kidney disease, or unspecified chronic kidney disease: Secondary | ICD-10-CM | POA: Diagnosis not present

## 2021-08-24 DIAGNOSIS — Z483 Aftercare following surgery for neoplasm: Secondary | ICD-10-CM | POA: Diagnosis not present

## 2021-08-24 DIAGNOSIS — Z4801 Encounter for change or removal of surgical wound dressing: Secondary | ICD-10-CM | POA: Diagnosis not present

## 2021-08-24 DIAGNOSIS — D485 Neoplasm of uncertain behavior of skin: Secondary | ICD-10-CM | POA: Diagnosis not present

## 2021-08-25 NOTE — Progress Notes (Incomplete)
[name] presents today for follow-up after completing radiation to the right side of her nose on 07/29/2021 ? ?Skin:  ?Pain: ?Fatigue:  ?Dermatology F/U: Saw Dr. Brendolyn Patty on 08/22/2021 ?Other issues of note:  ?

## 2021-08-26 ENCOUNTER — Telehealth: Payer: Self-pay | Admitting: *Deleted

## 2021-08-26 ENCOUNTER — Ambulatory Visit: Payer: Medicare Other | Admitting: Radiation Oncology

## 2021-08-26 DIAGNOSIS — D485 Neoplasm of uncertain behavior of skin: Secondary | ICD-10-CM | POA: Diagnosis not present

## 2021-08-26 DIAGNOSIS — Z4801 Encounter for change or removal of surgical wound dressing: Secondary | ICD-10-CM | POA: Diagnosis not present

## 2021-08-26 DIAGNOSIS — N189 Chronic kidney disease, unspecified: Secondary | ICD-10-CM | POA: Diagnosis not present

## 2021-08-26 DIAGNOSIS — E1122 Type 2 diabetes mellitus with diabetic chronic kidney disease: Secondary | ICD-10-CM | POA: Diagnosis not present

## 2021-08-26 DIAGNOSIS — I129 Hypertensive chronic kidney disease with stage 1 through stage 4 chronic kidney disease, or unspecified chronic kidney disease: Secondary | ICD-10-CM | POA: Diagnosis not present

## 2021-08-26 DIAGNOSIS — Z483 Aftercare following surgery for neoplasm: Secondary | ICD-10-CM | POA: Diagnosis not present

## 2021-08-26 NOTE — Telephone Encounter (Signed)
CALLED PATIENT'S DAUGHTER TO ASK ABOUT RESCHEDULING MOM'S MISSED FU, SPOKE WITH HER DAUGHTER MARGARET ELLIOTT AND SHE AGREED TO BRING HER ON 09-02-21 @ 3:20 PM ?

## 2021-08-29 DIAGNOSIS — M25551 Pain in right hip: Secondary | ICD-10-CM | POA: Diagnosis not present

## 2021-08-29 DIAGNOSIS — Z7982 Long term (current) use of aspirin: Secondary | ICD-10-CM | POA: Diagnosis not present

## 2021-08-29 DIAGNOSIS — L570 Actinic keratosis: Secondary | ICD-10-CM | POA: Diagnosis not present

## 2021-08-29 DIAGNOSIS — L219 Seborrheic dermatitis, unspecified: Secondary | ICD-10-CM | POA: Diagnosis not present

## 2021-08-29 DIAGNOSIS — R4189 Other symptoms and signs involving cognitive functions and awareness: Secondary | ICD-10-CM | POA: Diagnosis not present

## 2021-08-29 DIAGNOSIS — D485 Neoplasm of uncertain behavior of skin: Secondary | ICD-10-CM | POA: Diagnosis not present

## 2021-08-29 DIAGNOSIS — G8929 Other chronic pain: Secondary | ICD-10-CM | POA: Diagnosis not present

## 2021-08-29 DIAGNOSIS — Z87891 Personal history of nicotine dependence: Secondary | ICD-10-CM | POA: Diagnosis not present

## 2021-08-29 DIAGNOSIS — I129 Hypertensive chronic kidney disease with stage 1 through stage 4 chronic kidney disease, or unspecified chronic kidney disease: Secondary | ICD-10-CM | POA: Diagnosis not present

## 2021-08-29 DIAGNOSIS — D72829 Elevated white blood cell count, unspecified: Secondary | ICD-10-CM | POA: Diagnosis not present

## 2021-08-29 DIAGNOSIS — H5789 Other specified disorders of eye and adnexa: Secondary | ICD-10-CM | POA: Diagnosis not present

## 2021-08-29 DIAGNOSIS — N189 Chronic kidney disease, unspecified: Secondary | ICD-10-CM | POA: Diagnosis not present

## 2021-08-29 DIAGNOSIS — Z79891 Long term (current) use of opiate analgesic: Secondary | ICD-10-CM | POA: Diagnosis not present

## 2021-08-29 DIAGNOSIS — Z7409 Other reduced mobility: Secondary | ICD-10-CM | POA: Diagnosis not present

## 2021-08-29 DIAGNOSIS — N3281 Overactive bladder: Secondary | ICD-10-CM | POA: Diagnosis not present

## 2021-08-29 DIAGNOSIS — B353 Tinea pedis: Secondary | ICD-10-CM | POA: Diagnosis not present

## 2021-08-29 DIAGNOSIS — M810 Age-related osteoporosis without current pathological fracture: Secondary | ICD-10-CM | POA: Diagnosis not present

## 2021-08-29 DIAGNOSIS — E1122 Type 2 diabetes mellitus with diabetic chronic kidney disease: Secondary | ICD-10-CM | POA: Diagnosis not present

## 2021-08-29 DIAGNOSIS — Z9181 History of falling: Secondary | ICD-10-CM | POA: Diagnosis not present

## 2021-08-29 DIAGNOSIS — Z4801 Encounter for change or removal of surgical wound dressing: Secondary | ICD-10-CM | POA: Diagnosis not present

## 2021-08-29 DIAGNOSIS — M542 Cervicalgia: Secondary | ICD-10-CM | POA: Diagnosis not present

## 2021-08-29 DIAGNOSIS — Z993 Dependence on wheelchair: Secondary | ICD-10-CM | POA: Diagnosis not present

## 2021-08-29 DIAGNOSIS — N3000 Acute cystitis without hematuria: Secondary | ICD-10-CM | POA: Diagnosis not present

## 2021-08-29 DIAGNOSIS — Z483 Aftercare following surgery for neoplasm: Secondary | ICD-10-CM | POA: Diagnosis not present

## 2021-08-29 DIAGNOSIS — M170 Bilateral primary osteoarthritis of knee: Secondary | ICD-10-CM | POA: Diagnosis not present

## 2021-09-01 DIAGNOSIS — E119 Type 2 diabetes mellitus without complications: Secondary | ICD-10-CM | POA: Diagnosis not present

## 2021-09-01 DIAGNOSIS — S72001D Fracture of unspecified part of neck of right femur, subsequent encounter for closed fracture with routine healing: Secondary | ICD-10-CM | POA: Diagnosis not present

## 2021-09-01 DIAGNOSIS — M170 Bilateral primary osteoarthritis of knee: Secondary | ICD-10-CM | POA: Diagnosis not present

## 2021-09-02 ENCOUNTER — Ambulatory Visit: Payer: Medicare Other | Admitting: Radiation Oncology

## 2021-09-02 ENCOUNTER — Telehealth: Payer: Self-pay | Admitting: *Deleted

## 2021-09-02 NOTE — Telephone Encounter (Signed)
RETURNED PATIENT'S DAUGHTER PHONE CALL. ?

## 2021-09-05 DIAGNOSIS — Z4801 Encounter for change or removal of surgical wound dressing: Secondary | ICD-10-CM | POA: Diagnosis not present

## 2021-09-05 DIAGNOSIS — N189 Chronic kidney disease, unspecified: Secondary | ICD-10-CM | POA: Diagnosis not present

## 2021-09-05 DIAGNOSIS — Z483 Aftercare following surgery for neoplasm: Secondary | ICD-10-CM | POA: Diagnosis not present

## 2021-09-05 DIAGNOSIS — I129 Hypertensive chronic kidney disease with stage 1 through stage 4 chronic kidney disease, or unspecified chronic kidney disease: Secondary | ICD-10-CM | POA: Diagnosis not present

## 2021-09-05 DIAGNOSIS — D485 Neoplasm of uncertain behavior of skin: Secondary | ICD-10-CM | POA: Diagnosis not present

## 2021-09-05 DIAGNOSIS — E1122 Type 2 diabetes mellitus with diabetic chronic kidney disease: Secondary | ICD-10-CM | POA: Diagnosis not present

## 2021-09-07 DIAGNOSIS — D485 Neoplasm of uncertain behavior of skin: Secondary | ICD-10-CM | POA: Diagnosis not present

## 2021-09-07 DIAGNOSIS — I129 Hypertensive chronic kidney disease with stage 1 through stage 4 chronic kidney disease, or unspecified chronic kidney disease: Secondary | ICD-10-CM | POA: Diagnosis not present

## 2021-09-07 DIAGNOSIS — E1122 Type 2 diabetes mellitus with diabetic chronic kidney disease: Secondary | ICD-10-CM | POA: Diagnosis not present

## 2021-09-07 DIAGNOSIS — Z483 Aftercare following surgery for neoplasm: Secondary | ICD-10-CM | POA: Diagnosis not present

## 2021-09-07 DIAGNOSIS — N189 Chronic kidney disease, unspecified: Secondary | ICD-10-CM | POA: Diagnosis not present

## 2021-09-07 DIAGNOSIS — Z4801 Encounter for change or removal of surgical wound dressing: Secondary | ICD-10-CM | POA: Diagnosis not present

## 2021-09-12 DIAGNOSIS — M19011 Primary osteoarthritis, right shoulder: Secondary | ICD-10-CM | POA: Diagnosis not present

## 2021-09-16 NOTE — Progress Notes (Signed)
                                                                                                                                                             Patient Name: [name] MRN: [id number] DOB: 08-11-1922 Referring Physician: Eliezer Lofts (Profile Not Attached) Date of Service: 07/29/2021 [place], [place]                                                        End Of Treatment Note  Diagnoses: C44.301-Unspecified malignant neoplasm of skin of nose C44.301-Unspecified malignant neoplasm of skin of nose  Cancer Staging:  Cancer Staging  Basal cell carcinoma of nose Staging form: Cutaneous Carcinoma of the Head and Neck, AJCC 8th Edition - Clinical stage from 06/14/2021: Stage I (cT1, cN0, cM0) - Signed by Eppie Gibson, MD on 06/14/2021 Extraosseous extension: Absent   Intent: Curative  Radiation Treatment Dates: 06/21/2021 through 07/29/2021 Site Technique Total Dose (Gy) Dose per Fx (Gy) Completed Fx Beam Energies  Nose: HN_nose Complex 44/44 4.4 10/10 6E   Narrative: The patient tolerated radiation therapy relatively well.   Plan: The patient will follow-up with radiation oncology in 66mo. -----------------------------------  SEppie Gibson MD

## 2021-09-20 DIAGNOSIS — I10 Essential (primary) hypertension: Secondary | ICD-10-CM | POA: Diagnosis not present

## 2021-09-20 DIAGNOSIS — M810 Age-related osteoporosis without current pathological fracture: Secondary | ICD-10-CM | POA: Diagnosis not present

## 2021-09-24 ENCOUNTER — Emergency Department (HOSPITAL_COMMUNITY): Payer: Medicare Other

## 2021-09-24 ENCOUNTER — Emergency Department (HOSPITAL_COMMUNITY)
Admission: EM | Admit: 2021-09-24 | Discharge: 2021-09-25 | Disposition: A | Discharge status: Home / Self Care | Payer: Medicare Other | Attending: Emergency Medicine | Admitting: Emergency Medicine

## 2021-09-24 ENCOUNTER — Other Ambulatory Visit: Payer: Self-pay

## 2021-09-24 ENCOUNTER — Encounter (HOSPITAL_COMMUNITY): Payer: Self-pay

## 2021-09-24 DIAGNOSIS — M542 Cervicalgia: Secondary | ICD-10-CM | POA: Diagnosis not present

## 2021-09-24 DIAGNOSIS — G9389 Other specified disorders of brain: Secondary | ICD-10-CM | POA: Diagnosis not present

## 2021-09-24 DIAGNOSIS — M25552 Pain in left hip: Secondary | ICD-10-CM | POA: Diagnosis not present

## 2021-09-24 DIAGNOSIS — W07XXXA Fall from chair, initial encounter: Secondary | ICD-10-CM | POA: Diagnosis not present

## 2021-09-24 DIAGNOSIS — S0081XA Abrasion of other part of head, initial encounter: Secondary | ICD-10-CM | POA: Diagnosis not present

## 2021-09-24 DIAGNOSIS — S199XXA Unspecified injury of neck, initial encounter: Secondary | ICD-10-CM | POA: Diagnosis not present

## 2021-09-24 DIAGNOSIS — W19XXXA Unspecified fall, initial encounter: Secondary | ICD-10-CM | POA: Diagnosis not present

## 2021-09-24 DIAGNOSIS — I6529 Occlusion and stenosis of unspecified carotid artery: Secondary | ICD-10-CM | POA: Diagnosis not present

## 2021-09-24 DIAGNOSIS — S7002XA Contusion of left hip, initial encounter: Secondary | ICD-10-CM | POA: Diagnosis not present

## 2021-09-24 DIAGNOSIS — G319 Degenerative disease of nervous system, unspecified: Secondary | ICD-10-CM | POA: Diagnosis not present

## 2021-09-24 DIAGNOSIS — S0990XA Unspecified injury of head, initial encounter: Secondary | ICD-10-CM | POA: Insufficient documentation

## 2021-09-24 DIAGNOSIS — M4319 Spondylolisthesis, multiple sites in spine: Secondary | ICD-10-CM | POA: Diagnosis not present

## 2021-09-24 DIAGNOSIS — Z7982 Long term (current) use of aspirin: Secondary | ICD-10-CM | POA: Insufficient documentation

## 2021-09-24 DIAGNOSIS — J984 Other disorders of lung: Secondary | ICD-10-CM | POA: Diagnosis not present

## 2021-09-24 DIAGNOSIS — D179 Benign lipomatous neoplasm, unspecified: Secondary | ICD-10-CM | POA: Diagnosis not present

## 2021-09-24 DIAGNOSIS — S79912A Unspecified injury of left hip, initial encounter: Secondary | ICD-10-CM | POA: Diagnosis present

## 2021-09-24 DIAGNOSIS — D72829 Elevated white blood cell count, unspecified: Secondary | ICD-10-CM | POA: Insufficient documentation

## 2021-09-24 DIAGNOSIS — N83202 Unspecified ovarian cyst, left side: Secondary | ICD-10-CM | POA: Diagnosis not present

## 2021-09-24 DIAGNOSIS — I878 Other specified disorders of veins: Secondary | ICD-10-CM | POA: Diagnosis not present

## 2021-09-24 DIAGNOSIS — J9811 Atelectasis: Secondary | ICD-10-CM | POA: Diagnosis not present

## 2021-09-24 NOTE — ED Notes (Signed)
Call received from pt daughter [name] [phone] requesting rtn call for pt status/updates. [employer]

## 2021-09-24 NOTE — ED Triage Notes (Signed)
Pt. BIB [facility] from [facility] for a fall from chair. EMS reports stable VS. EMS notes laceration to the nose.

## 2021-09-24 NOTE — ED Provider Notes (Signed)
[facility] DEPT Provider Note   CSN: [id number] Arrival date & time: 09/24/21  2324     History  Chief Complaint  Patient presents with   [name] is a 88 y.o. female.  The history is provided by the patient, the EMS personnel and medical records.  Fall [name] is a 88 y.o. female who presents to the Emergency Department complaining of fall.  She presents to the emergency department by EMS for evaluation of injuries following a fall.  She is a resident at [facility] and she was sitting in a chair, trying to get up when she fell forward, striking her face.  She complained of pain to the left hip with EMS but reports that the pain is gone at time of ED arrival.  No complaints at time of arrival.  No recent illnesses.  Denies any headache, neck pain, chest pain, abdominal pain, nausea, vomiting.     Home Medications Prior to Admission medications   Medication Sig Start Date End Date Taking? Authorizing Provider  aspirin EC 81 MG tablet Take 81 mg by mouth daily. Swallow whole.   Yes [provider]  fluticasone (FLONASE) 50 MCG/ACT nasal spray PLACE 2 SPRAYS INTO BOTH NOSTRILS DAILY AS NEEDED FOR ALLERGIES. 06/10/20  Yes Bedsole, Amy E, MD  HYDROcodone-acetaminophen (NORCO/VICODIN) 5-325 MG tablet Take 1 tablet by mouth every 6 (six) hours as needed for moderate pain.   Yes [provider]  Loratadine 10 MG CAPS Take 1 capsule by mouth daily.   Yes [provider]  methocarbamol (ROBAXIN) 500 MG tablet Take 500 mg by mouth every 6 (six) hours as needed for muscle spasms.   Yes [provider]  Multiple Vitamin (MULTIVITAMIN PO) Take 1 tablet by mouth daily.   Yes [provider]  MYRBETRIQ 25 MG TB24 tablet Take 25 mg by mouth daily. 05/05/21  Yes [provider]  Propylene Glycol (SYSTANE BALANCE) 0.6 % SOLN Place 1-2 drops into both eyes daily.   Yes [provider]   ramipril (ALTACE) 5 MG capsule TAKE 1 CAPSULE BY MOUTH EVERY DAY 12/03/20  Yes Bedsole, Amy E, MD  SALINE NASAL SPRAY NA Place 2 sprays into the nose every 6 (six) hours as needed (nasal stuffiness).   Yes [provider]  Accu-Chek Softclix Lancets lancets CHECK BLOOD SUGAR 1 TIME A DAY. DX: E11.9 03/22/20   Bedsole, Amy E, MD  Blood Glucose Monitoring Suppl (ACCU-CHEK GUIDE) w/Device KIT 1 each by Does not apply route daily. Use to check blood sugar daily as needed.  Dx: E11.9 03/22/20   Bedsole, Amy E, MD  glucose blood (ACCU-CHEK GUIDE) test strip Use to check blood sugar 1 time daily 03/22/20   [name], MD  mupirocin ointment (BACTROBAN) 2 % Apply twice daily to affected areas until healed Patient not taking: Reported on 09/25/2021 05/18/21   Laurence Ferrari, [place], MD  mupirocin ointment (BACTROBAN) 2 % Apply 1 application topically daily. Apply to open wounds at left hand and right lower leg until healed Patient not taking: Reported on 09/25/2021 06/22/21   Laurence Ferrari, [place], MD      Allergies    Codeine, Other, and Statins    Review of Systems   Review of Systems  All other systems reviewed and are negative.  Physical Exam Updated Vital Signs BP (!) 163/87   Pulse 77   Temp 97.8 F (36.6 C) (Oral)   Resp 16  SpO2 93%  Physical Exam Vitals and nursing note reviewed.  Constitutional:      Appearance: She is well-developed.  HENT:     Head: Normocephalic.     Comments: Abrasion to the bridge of the nose.  There is a small amount of right periorbital ecchymoses.  EOMI. Neck:     Comments: No midline cervical spine tenderness Cardiovascular:     Rate and Rhythm: Normal rate and regular rhythm.     Heart sounds: No murmur heard. Pulmonary:     Effort: Pulmonary effort is normal. No respiratory distress.     Breath sounds: Normal breath sounds.  Abdominal:     Palpations: Abdomen is soft.     Tenderness: There is no abdominal tenderness. There is no guarding or rebound.   Musculoskeletal:     Comments: 2+ DP pulses bilaterally.  There is tenderness to palpation over the left hip and pelvis.  Left lower extremity is externally rotated and shortened.  Skin:    General: Skin is warm and dry.  Neurological:     Mental Status: She is alert and oriented to person, place, and time.  Psychiatric:        Behavior: Behavior normal.    ED Results / Procedures / Treatments   Labs (all labs ordered are listed, but only abnormal results are displayed) Labs Reviewed  BASIC METABOLIC PANEL - Abnormal; Notable for the following components:      Result Value   Glucose, Bld 122 (*)    BUN 39 (*)    Creatinine, Ser 1.12 (*)    GFR, Estimated 45 (*)    All other components within normal limits  CBC WITH DIFFERENTIAL/PLATELET - Abnormal; Notable for the following components:   WBC 11.8 (*)    MCV 100.2 (*)    Neutro Abs 9.1 (*)    All other components within normal limits  URINALYSIS, ROUTINE W REFLEX MICROSCOPIC    EKG None  Radiology DG Chest 1 View  Result Date: 09/25/2021 CLINICAL DATA:  Fall from chair. EXAM: CHEST  1 VIEW COMPARISON:  10/29/2020 FINDINGS: Stable heart size and mediastinal contours. Aortic atherosclerosis. Mild bibasilar atelectasis. No confluent consolidation. No pneumothorax or pleural effusion no displaced rib fractures. Advanced bilateral shoulder arthropathy. IMPRESSION: Mild bibasilar atelectasis. Electronically Signed   By: Keith Rake M.D.   On: 09/25/2021 00:02   CT Head Wo Contrast  Result Date: 09/25/2021 CLINICAL DATA:  Head trauma, minor (Age >= 65y) Fall. EXAM: CT HEAD WITHOUT CONTRAST TECHNIQUE: Contiguous axial images were obtained from the base of the skull through the vertex without intravenous contrast. RADIATION DOSE REDUCTION: This exam was performed according to the departmental dose-optimization program which includes automated exposure control, adjustment of the mA and/or kV according to patient size and/or use of  iterative reconstruction technique. COMPARISON:  Head CT 07/08/2019 FINDINGS: Brain: No intracranial hemorrhage, mass effect, or midline shift. Age related atrophy. No hydrocephalus. The basilar cisterns are patent. Mild periventricular chronic small vessel ischemia. Chronic right pontine calcification. No evidence of territorial infarct or acute ischemia. No extra-axial or intracranial fluid collection. Vascular: Atherosclerosis of skullbase vasculature without hyperdense vessel or abnormal calcification. Skull: No fracture or focal lesion. Sinuses/Orbits: Paranasal sinuses and mastoid air cells are clear. The visualized orbits are unremarkable. Bilateral cataract resection Other: None. IMPRESSION: 1. No acute intracranial abnormality. No skull fracture. 2. Age related atrophy and chronic small vessel ischemia. Electronically Signed   By: Keith Rake M.D.   On: 09/25/2021 00:17  CT Cervical Spine Wo Contrast  Result Date: 09/25/2021 CLINICAL DATA:  Neck trauma (Age >= 65y) Fall. EXAM: CT CERVICAL SPINE WITHOUT CONTRAST TECHNIQUE: Multidetector CT imaging of the cervical spine was performed without intravenous contrast. Multiplanar CT image reconstructions were also generated. RADIATION DOSE REDUCTION: This exam was performed according to the departmental dose-optimization program which includes automated exposure control, adjustment of the mA and/or kV according to patient size and/or use of iterative reconstruction technique. COMPARISON:  Head CT 07/08/2019 FINDINGS: Alignment: No traumatic subluxation. Chronic grade 1 anterolisthesis of C7 on T1 and trace anterolisthesis of C6 on C7. Skull base and vertebrae: No acute fracture. Vertebral body heights are maintained. The dens and skull base are intact. Soft tissues and spinal canal: No prevertebral fluid or swelling. No visible canal hematoma. Disc levels: Degenerative disc disease most prominent at C5-C6 and C7-T1. Multilevel facet hypertrophy.  Degenerative change at C1-C2 with pannus. Stable degenerative changes from prior. Upper chest: Biapical pleuroparenchymal scarring. No acute or unexpected findings. Other: Carotid calcifications. IMPRESSION: 1. No acute fracture or subluxation of the cervical spine. 2. Multilevel degenerative disc disease and facet hypertrophy, unchanged from prior exam. Electronically Signed   By: Keith Rake M.D.   On: 09/25/2021 00:22   CT Hip Left Wo Contrast  Result Date: 09/25/2021 CLINICAL DATA:  Fall from chair, left hip pain, fracture suspected but plain films negative. EXAM: CT OF THE LEFT HIP WITHOUT CONTRAST TECHNIQUE: Multidetector CT imaging of the left hip was performed according to the standard protocol. Multiplanar CT image reconstructions were also generated. RADIATION DOSE REDUCTION: This exam was performed according to the departmental dose-optimization program which includes automated exposure control, adjustment of the mA and/or kV according to patient size and/or use of iterative reconstruction technique. COMPARISON:  AP pelvis and left hip series yesterday, CT of abdomen and pelvis 02/23/2020 FINDINGS: Bones/Joint/Cartilage Generalized osteopenia. No displaced fracture is evident. There is no dislocation. Visualized portions of the sacrum and bony pelvis also do not show displaced fractures. There is moderate narrowing and mild spurring of the hip joint. Narrowing and osteophytes of the pubic symphysis, spurring and vacuum phenomenon left SI joint with advanced degenerative changes of the visualized lower lumbar spine. Ligaments Suboptimally assessed by CT. Muscles and Tendons There is a small lipoma of the left gluteus medius. There is a normal area muscle bulk for age. No intramuscular hematoma is seen. Visualized area tendons are grossly intact but not well seen with noncontrast CT. Soft tissues Stable 2.3 cm uncomplicated thin walled left ovarian cyst. There is sigmoid diverticulosis including  with a thick-walled 3.4 cm "giant " diverticulum of the superior wall of the distal sigmoid. Irregular wall thickening was seen previously and seems unchanged but could harbor carcinoma. PET-CT may be indicated. There is no pelvic sidewall adenopathy. There are left pelvic phleboliths. The left side of the bladder is unremarkable. There is a small left inguinal fat hernia. In the left hemipelvis there is no free air, free fluid or free hemorrhage. There is patchy left iliofemoral calcific disease but no heavy calcification. IMPRESSION: 1. Osteopenia and degenerative change without evidence of displaced fractures. 2. No subcutaneous or intramuscular hematoma is seen. 3. 3.4 cm "giant" diverticulum distal sigmoid colon with moderate irregular wall thickening. Although not grossly changed in the interval, PET-CT may be indicated to assess for underlying carcinoma. 4. Scattered iliofemoral calcific arteriosclerosis. 5. Stable 2.3 cm thin walled homogeneous left ovarian cyst. Electronically Signed   By: Ninfa Linden.D.  On: 09/25/2021 01:35   DG Hip Unilat W or Wo Pelvis 2-3 Views Left  Result Date: 09/25/2021 CLINICAL DATA:  Fall from chair.  Left hip pain. EXAM: DG HIP (WITH OR WITHOUT PELVIS) 2-3V LEFT COMPARISON:  Pelvis radiograph 11/01/2020 FINDINGS: No evidence of acute fracture. Pubic rami are intact. Mild left hip osteoarthritis. Pubic symphysis and sacroiliac joints are congruent. The bones are diffusely under mineralized. Right hip arthroplasty is intact were visualized. IMPRESSION: No fracture of the pelvis or left hip. Electronically Signed   By: Keith Rake M.D.   On: 09/25/2021 00:01    Procedures Procedures    Medications Ordered in ED Medications  acetaminophen (TYLENOL) tablet 650 mg (650 mg Oral Given 09/25/21 0152)    ED Course/ Medical Decision Making/ A&P                           Medical Decision Making Amount and/or Complexity of Data Reviewed Labs: ordered. Radiology:  ordered.  Risk OTC drugs.   Patient here for evaluation of injuries after falling out of her chair and striking her head.  She does have some mild pain to her left hip on palpation.  She is able to bear weight.  Imaging is negative for acute fracture, images personally reviewed and interpreted.  CT scan of the hip was obtained given her pain.  This is also negative for fracture.  Her CT scan does demonstrate incidental finding of large diverticulum.  Discussed this finding with both the patient and her daughter over the phone.  Discussed outpatient follow-up for this finding.  On record review patient did have hyponatremia on lab draw in March.  BMP checked today with resolution of her hyponatremia.  CBC with leukocytosis, this is stable when compared to priors.  She is able to ambulate with a walker in the department.  Plan to discharge back to [facility] with outpatient follow-up as needed.        Final Clinical Impression(s) / ED Diagnoses Final diagnoses:  Fall, initial encounter  Contusion of left hip, initial encounter  Abrasion of face, initial encounter    Rx / DC Orders ED Discharge Orders     None         Quintella Reichert, MD 09/25/21 [phone]

## 2021-09-25 ENCOUNTER — Emergency Department (HOSPITAL_COMMUNITY): Payer: Medicare Other

## 2021-09-25 DIAGNOSIS — S199XXA Unspecified injury of neck, initial encounter: Secondary | ICD-10-CM | POA: Diagnosis not present

## 2021-09-25 DIAGNOSIS — I10 Essential (primary) hypertension: Secondary | ICD-10-CM | POA: Diagnosis not present

## 2021-09-25 DIAGNOSIS — S0990XA Unspecified injury of head, initial encounter: Secondary | ICD-10-CM | POA: Diagnosis not present

## 2021-09-25 DIAGNOSIS — N83202 Unspecified ovarian cyst, left side: Secondary | ICD-10-CM | POA: Diagnosis not present

## 2021-09-25 DIAGNOSIS — R410 Disorientation, unspecified: Secondary | ICD-10-CM | POA: Diagnosis not present

## 2021-09-25 DIAGNOSIS — S7002XA Contusion of left hip, initial encounter: Secondary | ICD-10-CM | POA: Diagnosis not present

## 2021-09-25 DIAGNOSIS — G9389 Other specified disorders of brain: Secondary | ICD-10-CM | POA: Diagnosis not present

## 2021-09-25 DIAGNOSIS — G319 Degenerative disease of nervous system, unspecified: Secondary | ICD-10-CM | POA: Diagnosis not present

## 2021-09-25 DIAGNOSIS — D179 Benign lipomatous neoplasm, unspecified: Secondary | ICD-10-CM | POA: Diagnosis not present

## 2021-09-25 DIAGNOSIS — I6529 Occlusion and stenosis of unspecified carotid artery: Secondary | ICD-10-CM | POA: Diagnosis not present

## 2021-09-25 DIAGNOSIS — M4319 Spondylolisthesis, multiple sites in spine: Secondary | ICD-10-CM | POA: Diagnosis not present

## 2021-09-25 DIAGNOSIS — M25552 Pain in left hip: Secondary | ICD-10-CM | POA: Diagnosis not present

## 2021-09-25 DIAGNOSIS — I878 Other specified disorders of veins: Secondary | ICD-10-CM | POA: Diagnosis not present

## 2021-09-25 DIAGNOSIS — J984 Other disorders of lung: Secondary | ICD-10-CM | POA: Diagnosis not present

## 2021-09-25 DIAGNOSIS — W19XXXA Unspecified fall, initial encounter: Secondary | ICD-10-CM | POA: Diagnosis not present

## 2021-09-25 DIAGNOSIS — Z743 Need for continuous supervision: Secondary | ICD-10-CM | POA: Diagnosis not present

## 2021-09-25 LAB — BASIC METABOLIC PANEL
Anion gap: 8 (ref 5–15)
BUN: 39 mg/dL — ABNORMAL HIGH (ref 8–23)
CO2: 25 mmol/L (ref 22–32)
Calcium: 9.8 mg/dL (ref 8.9–10.3)
Chloride: 106 mmol/L (ref 98–111)
Creatinine, Ser: 1.12 mg/dL — ABNORMAL HIGH (ref 0.44–1.00)
GFR, Estimated: 45 mL/min — ABNORMAL LOW (ref >60)
Glucose, Bld: 122 mg/dL — ABNORMAL HIGH (ref 70–99)
Potassium: 4.6 mmol/L (ref 3.5–5.1)
Sodium: 139 mmol/L (ref 135–145)

## 2021-09-25 LAB — CBC WITH DIFFERENTIAL/PLATELET
Abs Immature Granulocytes: 0.06 10*3/uL (ref 0.00–0.07)
Basophils Absolute: 0.1 10*3/uL (ref 0.0–0.1)
Basophils Relative: 0 %
Eosinophils Absolute: 0.3 10*3/uL (ref 0.0–0.5)
Eosinophils Relative: 2 %
HCT: 41.8 % (ref 36.0–46.0)
Hemoglobin: 13.3 g/dL (ref 12.0–15.0)
Immature Granulocytes: 1 %
Lymphocytes Relative: 12 %
Lymphs Abs: 1.4 10*3/uL (ref 0.7–4.0)
MCH: 31.9 pg (ref 26.0–34.0)
MCHC: 31.8 g/dL (ref 30.0–36.0)
MCV: 100.2 fL — ABNORMAL HIGH (ref 80.0–100.0)
Monocytes Absolute: 0.9 10*3/uL (ref 0.1–1.0)
Monocytes Relative: 8 %
Neutro Abs: 9.1 10*3/uL — ABNORMAL HIGH (ref 1.7–7.7)
Neutrophils Relative %: 77 %
Platelets: 289 10*3/uL (ref 150–400)
RBC: 4.17 MIL/uL (ref 3.87–5.11)
RDW: 14 % (ref 11.5–15.5)
WBC: 11.8 10*3/uL — ABNORMAL HIGH (ref 4.0–10.5)
nRBC: 0 % (ref 0.0–0.2)

## 2021-09-25 MED ORDER — ACETAMINOPHEN 325 MG PO TABS
650.0000 mg | ORAL_TABLET | Freq: Once | ORAL | Status: AC
  Administered 2021-09-25: 650 mg via ORAL
  Filled 2021-09-25: qty 2

## 2021-09-25 NOTE — Discharge Instructions (Addendum)
You had a CT scan performed today in the Emergency Department.    Your CT scan showed some changes to your colon that need to be further discussed and  evaluated by your family doctor.    Your kidney function was stable today.

## 2021-09-25 NOTE — ED Notes (Signed)
Pt was able to sit herself up on side of bed and stand up to walker without staff assistance. Pt was able to ambulate with walker without staff assistance. Pt expressed some minor dizziness upon standing up and expressing L pinky toe pain.  Pt denies any shortness of breath, dizziness with ambulating.

## 2021-09-25 NOTE — ED Notes (Signed)
Attempted to call Durenda Age to provide pt report, no answer.

## 2021-09-25 NOTE — ED Notes (Signed)
Pt daughter [name] request call upon pt d/c

## 2021-09-25 NOTE — ED Notes (Signed)
[id number] Non-Emergency contacted for [facility] transport.

## 2021-09-26 DIAGNOSIS — S92512A Displaced fracture of proximal phalanx of left lesser toe(s), initial encounter for closed fracture: Secondary | ICD-10-CM | POA: Diagnosis not present

## 2021-09-26 DIAGNOSIS — I129 Hypertensive chronic kidney disease with stage 1 through stage 4 chronic kidney disease, or unspecified chronic kidney disease: Secondary | ICD-10-CM | POA: Diagnosis not present

## 2021-09-26 DIAGNOSIS — E119 Type 2 diabetes mellitus without complications: Secondary | ICD-10-CM | POA: Diagnosis not present

## 2021-09-26 DIAGNOSIS — S99922A Unspecified injury of left foot, initial encounter: Secondary | ICD-10-CM | POA: Diagnosis not present

## 2021-09-26 DIAGNOSIS — K5901 Slow transit constipation: Secondary | ICD-10-CM | POA: Diagnosis not present

## 2021-09-26 DIAGNOSIS — N1831 Chronic kidney disease, stage 3a: Secondary | ICD-10-CM | POA: Diagnosis not present

## 2021-09-26 DIAGNOSIS — W19XXXA Unspecified fall, initial encounter: Secondary | ICD-10-CM | POA: Diagnosis not present

## 2021-09-27 DIAGNOSIS — M05741 Rheumatoid arthritis with rheumatoid factor of right hand without organ or systems involvement: Secondary | ICD-10-CM | POA: Diagnosis not present

## 2021-09-27 DIAGNOSIS — M1712 Unilateral primary osteoarthritis, left knee: Secondary | ICD-10-CM | POA: Diagnosis not present

## 2021-09-28 DIAGNOSIS — M150 Primary generalized (osteo)arthritis: Secondary | ICD-10-CM | POA: Diagnosis not present

## 2021-09-28 DIAGNOSIS — Z87891 Personal history of nicotine dependence: Secondary | ICD-10-CM | POA: Diagnosis not present

## 2021-09-28 DIAGNOSIS — E1122 Type 2 diabetes mellitus with diabetic chronic kidney disease: Secondary | ICD-10-CM | POA: Diagnosis not present

## 2021-09-28 DIAGNOSIS — M810 Age-related osteoporosis without current pathological fracture: Secondary | ICD-10-CM | POA: Diagnosis not present

## 2021-09-28 DIAGNOSIS — H9192 Unspecified hearing loss, left ear: Secondary | ICD-10-CM | POA: Diagnosis not present

## 2021-09-28 DIAGNOSIS — I129 Hypertensive chronic kidney disease with stage 1 through stage 4 chronic kidney disease, or unspecified chronic kidney disease: Secondary | ICD-10-CM | POA: Diagnosis not present

## 2021-09-28 DIAGNOSIS — N189 Chronic kidney disease, unspecified: Secondary | ICD-10-CM | POA: Diagnosis not present

## 2021-09-28 DIAGNOSIS — M1030 Gout due to renal impairment, unspecified site: Secondary | ICD-10-CM | POA: Diagnosis not present

## 2021-09-28 DIAGNOSIS — Z9181 History of falling: Secondary | ICD-10-CM | POA: Diagnosis not present

## 2021-09-28 DIAGNOSIS — Z87440 Personal history of urinary (tract) infections: Secondary | ICD-10-CM | POA: Diagnosis not present

## 2021-09-28 DIAGNOSIS — M25572 Pain in left ankle and joints of left foot: Secondary | ICD-10-CM | POA: Diagnosis not present

## 2021-09-28 DIAGNOSIS — Z8673 Personal history of transient ischemic attack (TIA), and cerebral infarction without residual deficits: Secondary | ICD-10-CM | POA: Diagnosis not present

## 2021-09-28 DIAGNOSIS — Z556 Problems related to health literacy: Secondary | ICD-10-CM | POA: Diagnosis not present

## 2021-09-28 DIAGNOSIS — Z87310 Personal history of (healed) osteoporosis fracture: Secondary | ICD-10-CM | POA: Diagnosis not present

## 2021-09-28 DIAGNOSIS — R413 Other amnesia: Secondary | ICD-10-CM | POA: Diagnosis not present

## 2021-09-30 DIAGNOSIS — M25572 Pain in left ankle and joints of left foot: Secondary | ICD-10-CM | POA: Diagnosis not present

## 2021-09-30 DIAGNOSIS — E1122 Type 2 diabetes mellitus with diabetic chronic kidney disease: Secondary | ICD-10-CM | POA: Diagnosis not present

## 2021-09-30 DIAGNOSIS — M150 Primary generalized (osteo)arthritis: Secondary | ICD-10-CM | POA: Diagnosis not present

## 2021-09-30 DIAGNOSIS — M810 Age-related osteoporosis without current pathological fracture: Secondary | ICD-10-CM | POA: Diagnosis not present

## 2021-09-30 DIAGNOSIS — N189 Chronic kidney disease, unspecified: Secondary | ICD-10-CM | POA: Diagnosis not present

## 2021-09-30 DIAGNOSIS — I129 Hypertensive chronic kidney disease with stage 1 through stage 4 chronic kidney disease, or unspecified chronic kidney disease: Secondary | ICD-10-CM | POA: Diagnosis not present

## 2021-10-03 DIAGNOSIS — M810 Age-related osteoporosis without current pathological fracture: Secondary | ICD-10-CM | POA: Diagnosis not present

## 2021-10-03 DIAGNOSIS — E1122 Type 2 diabetes mellitus with diabetic chronic kidney disease: Secondary | ICD-10-CM | POA: Diagnosis not present

## 2021-10-03 DIAGNOSIS — M150 Primary generalized (osteo)arthritis: Secondary | ICD-10-CM | POA: Diagnosis not present

## 2021-10-03 DIAGNOSIS — M25572 Pain in left ankle and joints of left foot: Secondary | ICD-10-CM | POA: Diagnosis not present

## 2021-10-03 DIAGNOSIS — I129 Hypertensive chronic kidney disease with stage 1 through stage 4 chronic kidney disease, or unspecified chronic kidney disease: Secondary | ICD-10-CM | POA: Diagnosis not present

## 2021-10-03 DIAGNOSIS — N189 Chronic kidney disease, unspecified: Secondary | ICD-10-CM | POA: Diagnosis not present

## 2021-10-06 DIAGNOSIS — M25572 Pain in left ankle and joints of left foot: Secondary | ICD-10-CM | POA: Diagnosis not present

## 2021-10-06 DIAGNOSIS — E1122 Type 2 diabetes mellitus with diabetic chronic kidney disease: Secondary | ICD-10-CM | POA: Diagnosis not present

## 2021-10-06 DIAGNOSIS — N189 Chronic kidney disease, unspecified: Secondary | ICD-10-CM | POA: Diagnosis not present

## 2021-10-06 DIAGNOSIS — M810 Age-related osteoporosis without current pathological fracture: Secondary | ICD-10-CM | POA: Diagnosis not present

## 2021-10-06 DIAGNOSIS — I129 Hypertensive chronic kidney disease with stage 1 through stage 4 chronic kidney disease, or unspecified chronic kidney disease: Secondary | ICD-10-CM | POA: Diagnosis not present

## 2021-10-06 DIAGNOSIS — M150 Primary generalized (osteo)arthritis: Secondary | ICD-10-CM | POA: Diagnosis not present

## 2021-10-12 DIAGNOSIS — M150 Primary generalized (osteo)arthritis: Secondary | ICD-10-CM | POA: Diagnosis not present

## 2021-10-12 DIAGNOSIS — M810 Age-related osteoporosis without current pathological fracture: Secondary | ICD-10-CM | POA: Diagnosis not present

## 2021-10-12 DIAGNOSIS — M25572 Pain in left ankle and joints of left foot: Secondary | ICD-10-CM | POA: Diagnosis not present

## 2021-10-12 DIAGNOSIS — I129 Hypertensive chronic kidney disease with stage 1 through stage 4 chronic kidney disease, or unspecified chronic kidney disease: Secondary | ICD-10-CM | POA: Diagnosis not present

## 2021-10-12 DIAGNOSIS — E1122 Type 2 diabetes mellitus with diabetic chronic kidney disease: Secondary | ICD-10-CM | POA: Diagnosis not present

## 2021-10-12 DIAGNOSIS — N189 Chronic kidney disease, unspecified: Secondary | ICD-10-CM | POA: Diagnosis not present

## 2021-10-13 DIAGNOSIS — M150 Primary generalized (osteo)arthritis: Secondary | ICD-10-CM | POA: Diagnosis not present

## 2021-10-13 DIAGNOSIS — E1122 Type 2 diabetes mellitus with diabetic chronic kidney disease: Secondary | ICD-10-CM | POA: Diagnosis not present

## 2021-10-13 DIAGNOSIS — N189 Chronic kidney disease, unspecified: Secondary | ICD-10-CM | POA: Diagnosis not present

## 2021-10-13 DIAGNOSIS — M810 Age-related osteoporosis without current pathological fracture: Secondary | ICD-10-CM | POA: Diagnosis not present

## 2021-10-13 DIAGNOSIS — I129 Hypertensive chronic kidney disease with stage 1 through stage 4 chronic kidney disease, or unspecified chronic kidney disease: Secondary | ICD-10-CM | POA: Diagnosis not present

## 2021-10-13 DIAGNOSIS — M25572 Pain in left ankle and joints of left foot: Secondary | ICD-10-CM | POA: Diagnosis not present

## 2021-10-17 DIAGNOSIS — M150 Primary generalized (osteo)arthritis: Secondary | ICD-10-CM | POA: Diagnosis not present

## 2021-10-17 DIAGNOSIS — M25572 Pain in left ankle and joints of left foot: Secondary | ICD-10-CM | POA: Diagnosis not present

## 2021-10-17 DIAGNOSIS — N189 Chronic kidney disease, unspecified: Secondary | ICD-10-CM | POA: Diagnosis not present

## 2021-10-17 DIAGNOSIS — E1122 Type 2 diabetes mellitus with diabetic chronic kidney disease: Secondary | ICD-10-CM | POA: Diagnosis not present

## 2021-10-17 DIAGNOSIS — M810 Age-related osteoporosis without current pathological fracture: Secondary | ICD-10-CM | POA: Diagnosis not present

## 2021-10-17 DIAGNOSIS — I129 Hypertensive chronic kidney disease with stage 1 through stage 4 chronic kidney disease, or unspecified chronic kidney disease: Secondary | ICD-10-CM | POA: Diagnosis not present

## 2021-10-19 DIAGNOSIS — M810 Age-related osteoporosis without current pathological fracture: Secondary | ICD-10-CM | POA: Diagnosis not present

## 2021-10-19 DIAGNOSIS — I129 Hypertensive chronic kidney disease with stage 1 through stage 4 chronic kidney disease, or unspecified chronic kidney disease: Secondary | ICD-10-CM | POA: Diagnosis not present

## 2021-10-19 DIAGNOSIS — N189 Chronic kidney disease, unspecified: Secondary | ICD-10-CM | POA: Diagnosis not present

## 2021-10-19 DIAGNOSIS — M25572 Pain in left ankle and joints of left foot: Secondary | ICD-10-CM | POA: Diagnosis not present

## 2021-10-19 DIAGNOSIS — M150 Primary generalized (osteo)arthritis: Secondary | ICD-10-CM | POA: Diagnosis not present

## 2021-10-19 DIAGNOSIS — E1122 Type 2 diabetes mellitus with diabetic chronic kidney disease: Secondary | ICD-10-CM | POA: Diagnosis not present

## 2021-10-24 DIAGNOSIS — E119 Type 2 diabetes mellitus without complications: Secondary | ICD-10-CM | POA: Diagnosis not present

## 2021-10-24 DIAGNOSIS — N1831 Chronic kidney disease, stage 3a: Secondary | ICD-10-CM | POA: Diagnosis not present

## 2021-10-24 DIAGNOSIS — K5901 Slow transit constipation: Secondary | ICD-10-CM | POA: Diagnosis not present

## 2021-10-24 DIAGNOSIS — I129 Hypertensive chronic kidney disease with stage 1 through stage 4 chronic kidney disease, or unspecified chronic kidney disease: Secondary | ICD-10-CM | POA: Diagnosis not present

## 2021-10-24 DIAGNOSIS — J302 Other seasonal allergic rhinitis: Secondary | ICD-10-CM | POA: Diagnosis not present

## 2021-10-25 DIAGNOSIS — E1122 Type 2 diabetes mellitus with diabetic chronic kidney disease: Secondary | ICD-10-CM | POA: Diagnosis not present

## 2021-10-25 DIAGNOSIS — M150 Primary generalized (osteo)arthritis: Secondary | ICD-10-CM | POA: Diagnosis not present

## 2021-10-25 DIAGNOSIS — I129 Hypertensive chronic kidney disease with stage 1 through stage 4 chronic kidney disease, or unspecified chronic kidney disease: Secondary | ICD-10-CM | POA: Diagnosis not present

## 2021-10-25 DIAGNOSIS — N189 Chronic kidney disease, unspecified: Secondary | ICD-10-CM | POA: Diagnosis not present

## 2021-10-25 DIAGNOSIS — M25572 Pain in left ankle and joints of left foot: Secondary | ICD-10-CM | POA: Diagnosis not present

## 2021-10-25 DIAGNOSIS — M810 Age-related osteoporosis without current pathological fracture: Secondary | ICD-10-CM | POA: Diagnosis not present

## 2021-10-28 DIAGNOSIS — M25572 Pain in left ankle and joints of left foot: Secondary | ICD-10-CM | POA: Diagnosis not present

## 2021-10-28 DIAGNOSIS — Z556 Problems related to health literacy: Secondary | ICD-10-CM | POA: Diagnosis not present

## 2021-10-28 DIAGNOSIS — M810 Age-related osteoporosis without current pathological fracture: Secondary | ICD-10-CM | POA: Diagnosis not present

## 2021-10-28 DIAGNOSIS — E1122 Type 2 diabetes mellitus with diabetic chronic kidney disease: Secondary | ICD-10-CM | POA: Diagnosis not present

## 2021-10-28 DIAGNOSIS — R413 Other amnesia: Secondary | ICD-10-CM | POA: Diagnosis not present

## 2021-10-28 DIAGNOSIS — M150 Primary generalized (osteo)arthritis: Secondary | ICD-10-CM | POA: Diagnosis not present

## 2021-10-28 DIAGNOSIS — H9192 Unspecified hearing loss, left ear: Secondary | ICD-10-CM | POA: Diagnosis not present

## 2021-10-28 DIAGNOSIS — Z9181 History of falling: Secondary | ICD-10-CM | POA: Diagnosis not present

## 2021-10-28 DIAGNOSIS — M1030 Gout due to renal impairment, unspecified site: Secondary | ICD-10-CM | POA: Diagnosis not present

## 2021-10-28 DIAGNOSIS — N189 Chronic kidney disease, unspecified: Secondary | ICD-10-CM | POA: Diagnosis not present

## 2021-10-28 DIAGNOSIS — Z87310 Personal history of (healed) osteoporosis fracture: Secondary | ICD-10-CM | POA: Diagnosis not present

## 2021-10-28 DIAGNOSIS — Z87440 Personal history of urinary (tract) infections: Secondary | ICD-10-CM | POA: Diagnosis not present

## 2021-10-28 DIAGNOSIS — Z87891 Personal history of nicotine dependence: Secondary | ICD-10-CM | POA: Diagnosis not present

## 2021-10-28 DIAGNOSIS — I129 Hypertensive chronic kidney disease with stage 1 through stage 4 chronic kidney disease, or unspecified chronic kidney disease: Secondary | ICD-10-CM | POA: Diagnosis not present

## 2021-10-28 DIAGNOSIS — Z8673 Personal history of transient ischemic attack (TIA), and cerebral infarction without residual deficits: Secondary | ICD-10-CM | POA: Diagnosis not present

## 2021-11-01 ENCOUNTER — Emergency Department (HOSPITAL_COMMUNITY): Payer: Medicare Other

## 2021-11-01 ENCOUNTER — Emergency Department (HOSPITAL_COMMUNITY)
Admission: EM | Admit: 2021-11-01 | Discharge: 2021-11-02 | Disposition: A | Discharge status: Skilled Nursing Facility | Payer: Medicare Other | Attending: Emergency Medicine | Admitting: Emergency Medicine

## 2021-11-01 ENCOUNTER — Encounter (HOSPITAL_COMMUNITY): Payer: Self-pay

## 2021-11-01 ENCOUNTER — Other Ambulatory Visit: Payer: Self-pay

## 2021-11-01 DIAGNOSIS — M4313 Spondylolisthesis, cervicothoracic region: Secondary | ICD-10-CM | POA: Diagnosis not present

## 2021-11-01 DIAGNOSIS — S0033XA Contusion of nose, initial encounter: Secondary | ICD-10-CM | POA: Diagnosis not present

## 2021-11-01 DIAGNOSIS — Z043 Encounter for examination and observation following other accident: Secondary | ICD-10-CM | POA: Diagnosis not present

## 2021-11-01 DIAGNOSIS — M79605 Pain in left leg: Secondary | ICD-10-CM | POA: Diagnosis not present

## 2021-11-01 DIAGNOSIS — Z7982 Long term (current) use of aspirin: Secondary | ICD-10-CM | POA: Insufficient documentation

## 2021-11-01 DIAGNOSIS — M2578 Osteophyte, vertebrae: Secondary | ICD-10-CM | POA: Diagnosis not present

## 2021-11-01 DIAGNOSIS — T07XXXA Unspecified multiple injuries, initial encounter: Secondary | ICD-10-CM

## 2021-11-01 DIAGNOSIS — S0093XA Contusion of unspecified part of head, initial encounter: Secondary | ICD-10-CM | POA: Diagnosis not present

## 2021-11-01 DIAGNOSIS — S0990XA Unspecified injury of head, initial encounter: Secondary | ICD-10-CM | POA: Diagnosis not present

## 2021-11-01 DIAGNOSIS — M25561 Pain in right knee: Secondary | ICD-10-CM | POA: Diagnosis not present

## 2021-11-01 DIAGNOSIS — M25552 Pain in left hip: Secondary | ICD-10-CM | POA: Diagnosis not present

## 2021-11-01 DIAGNOSIS — M4802 Spinal stenosis, cervical region: Secondary | ICD-10-CM | POA: Diagnosis not present

## 2021-11-01 DIAGNOSIS — G319 Degenerative disease of nervous system, unspecified: Secondary | ICD-10-CM | POA: Diagnosis not present

## 2021-11-01 DIAGNOSIS — M542 Cervicalgia: Secondary | ICD-10-CM | POA: Insufficient documentation

## 2021-11-01 DIAGNOSIS — M25562 Pain in left knee: Secondary | ICD-10-CM | POA: Insufficient documentation

## 2021-11-01 DIAGNOSIS — Y92009 Unspecified place in unspecified non-institutional (private) residence as the place of occurrence of the external cause: Secondary | ICD-10-CM | POA: Diagnosis not present

## 2021-11-01 DIAGNOSIS — S1093XA Contusion of unspecified part of neck, initial encounter: Secondary | ICD-10-CM | POA: Diagnosis not present

## 2021-11-01 DIAGNOSIS — R0789 Other chest pain: Secondary | ICD-10-CM | POA: Insufficient documentation

## 2021-11-01 DIAGNOSIS — R918 Other nonspecific abnormal finding of lung field: Secondary | ICD-10-CM | POA: Diagnosis not present

## 2021-11-01 DIAGNOSIS — W19XXXA Unspecified fall, initial encounter: Secondary | ICD-10-CM | POA: Insufficient documentation

## 2021-11-01 DIAGNOSIS — S299XXA Unspecified injury of thorax, initial encounter: Secondary | ICD-10-CM | POA: Diagnosis not present

## 2021-11-01 DIAGNOSIS — M79604 Pain in right leg: Secondary | ICD-10-CM | POA: Diagnosis not present

## 2021-11-01 DIAGNOSIS — S0083XA Contusion of other part of head, initial encounter: Secondary | ICD-10-CM | POA: Insufficient documentation

## 2021-11-01 DIAGNOSIS — S199XXA Unspecified injury of neck, initial encounter: Secondary | ICD-10-CM | POA: Diagnosis not present

## 2021-11-01 DIAGNOSIS — I739 Peripheral vascular disease, unspecified: Secondary | ICD-10-CM | POA: Diagnosis not present

## 2021-11-01 DIAGNOSIS — S0993XA Unspecified injury of face, initial encounter: Secondary | ICD-10-CM | POA: Diagnosis not present

## 2021-11-01 MED ORDER — ACETAMINOPHEN 325 MG PO TABS
650.0000 mg | ORAL_TABLET | Freq: Once | ORAL | Status: DC
  Filled 2021-11-01: qty 2

## 2021-11-01 NOTE — Discharge Instructions (Signed)
We saw you in the ER after you had a fall. °All the imaging results are normal, no fractures seen. No evidence of brain bleed. °Please be very careful with walking, and do everything possible to prevent falls. ° ° °

## 2021-11-01 NOTE — ED Notes (Signed)
[facility] called for transport.  

## 2021-11-01 NOTE — ED Notes (Signed)
Fall risk precautions implemented. Yellow grippy socks, fall risk wristband applied, bed alarm activated.

## 2021-11-01 NOTE — ED Triage Notes (Signed)
Patient BIB [facility] from [facility]. Slipped out of a chair and hit the bridge of her nose. Having left lower rib pain. Patient is on aspirin.

## 2021-11-01 NOTE — ED Provider Notes (Signed)
[name] DEPT Provider Note   CSN: [id number] Arrival date & time: 11/01/21  2207     History  Chief Complaint  Patient presents with   [name] is a 88 y.o. female.  HPI     88 year old female comes in with chief complaint of fall. Patient had a mechanical fall while at home.  She resides at [facility], indicates that she stood up out of chair and hit the bridge of her nose.  She is having pain over the lower chest region, face and her neck.  She is also having some pain over her legs.  Patient was not on the floor for long period, because she started hollering and get attention of the staff there.  She is not on any blood thinners.  Denies any loss of consciousness.  Home Medications Prior to Admission medications   Medication Sig Start Date End Date Taking? Authorizing Provider  Accu-Chek Softclix Lancets lancets CHECK BLOOD SUGAR 1 TIME A DAY. DX: E11.9 03/22/20   Bedsole, Amy E, MD  aspirin EC 81 MG tablet Take 81 mg by mouth daily. Swallow whole.    [provider]  Blood Glucose Monitoring Suppl (ACCU-CHEK GUIDE) w/Device KIT 1 each by Does not apply route daily. Use to check blood sugar daily as needed.  Dx: E11.9 03/22/20   Bedsole, Amy E, MD  fluticasone (FLONASE) 50 MCG/ACT nasal spray PLACE 2 SPRAYS INTO BOTH NOSTRILS DAILY AS NEEDED FOR ALLERGIES. 06/10/20   Bedsole, Amy E, MD  glucose blood (ACCU-CHEK GUIDE) test strip Use to check blood sugar 1 time daily 03/22/20   Bedsole, Amy E, MD  HYDROcodone-acetaminophen (NORCO/VICODIN) 5-325 MG tablet Take 1 tablet by mouth every 6 (six) hours as needed for moderate pain.    [provider]  Loratadine 10 MG CAPS Take 1 capsule by mouth daily.    [provider]  methocarbamol (ROBAXIN) 500 MG tablet Take 500 mg by mouth every 6 (six) hours as needed for muscle spasms.    [provider]  Multiple Vitamin (MULTIVITAMIN PO) Take 1 tablet by  mouth daily.    [provider]  mupirocin ointment (BACTROBAN) 2 % Apply twice daily to affected areas until healed Patient not taking: Reported on 09/25/2021 05/18/21   Laurence Ferrari, [place], MD  mupirocin ointment (BACTROBAN) 2 % Apply 1 application topically daily. Apply to open wounds at left hand and right lower leg until healed Patient not taking: Reported on 09/25/2021 06/22/21   Moye, [place], MD  MYRBETRIQ 25 MG TB24 tablet Take 25 mg by mouth daily. 05/05/21   [provider]  Propylene Glycol (SYSTANE BALANCE) 0.6 % SOLN Place 1-2 drops into both eyes daily.    [provider]  ramipril (ALTACE) 5 MG capsule TAKE 1 CAPSULE BY MOUTH EVERY DAY 12/03/20   Bedsole, Amy E, MD  SALINE NASAL SPRAY NA Place 2 sprays into the nose every 6 (six) hours as needed (nasal stuffiness).    [provider]      Allergies    Codeine, Other, and Statins    Review of Systems   Review of Systems  All other systems reviewed and are negative.   Physical Exam Updated Vital Signs BP (!) 193/112 (BP Location: Right Arm)   Pulse 71   Temp (!) 97.4 F (36.3 C) (Oral)   Resp 18   SpO2 100%  Physical Exam Vitals and nursing note reviewed.  Constitutional:  Appearance: She is well-developed.  HENT:     Head:     Comments: Patient has mild edema of the nasal bridge with ecchymosis Neck:     Comments: Paraspinal tenderness Cardiovascular:     Rate and Rhythm: Normal rate.  Pulmonary:     Effort: Pulmonary effort is normal.  Musculoskeletal:        General: Tenderness present. No deformity.     Cervical back: Neck supple. Tenderness present.     Comments: Besides the facial hematoma and tenderness to palpation of bilateral knees:  Head to toe evaluation shows no hematoma, bleeding of the scalp, no facial abrasions, no spine step offs, crepitus of the chest or neck, no tenderness to palpation of the bilateral upper and lower extremities, no gross deformities, no chest  tenderness, no pelvic pain.   Skin:    General: Skin is warm and dry.  Neurological:     Mental Status: She is alert and oriented to person, place, and time.     ED Results / Procedures / Treatments   Labs (all labs ordered are listed, but only abnormal results are displayed) Labs Reviewed - No data to display  EKG None  Radiology CT Cervical Spine Wo Contrast  Result Date: 11/01/2021 CLINICAL DATA:  Trauma EXAM: CT CERVICAL SPINE WITHOUT CONTRAST TECHNIQUE: Multidetector CT imaging of the cervical spine was performed without intravenous contrast. Multiplanar CT image reconstructions were also generated. RADIATION DOSE REDUCTION: This exam was performed according to the departmental dose-optimization program which includes automated exposure control, adjustment of the mA and/or kV according to patient size and/or use of iterative reconstruction technique. COMPARISON:  Cervical spine CT 09/25/2021 FINDINGS: Alignment: There is 4 mm of anterolisthesis at C7-T1 which is unchanged and favored as degenerative. Spinal alignment is otherwise within normal limits. Skull base and vertebrae: No acute fracture. No primary bone lesion or focal pathologic process. Soft tissues and spinal canal: No prevertebral fluid or swelling. No visible canal hematoma. Disc levels: There is disc space narrowing and endplate osteophyte formation throughout the cervical spine compatible with degenerative change similar to the prior study. There is also bilateral facet arthropathy, unchanged from prior. There is bilateral neural foraminal stenosis most significant on the left at C4-C5 and C5-C6 and on the right at C3-C4, unchanged. There is no severe central canal stenosis at any level. Upper chest: Negative. Other: Multiple thyroid nodules are again seen measuring up to 13 mm on the left. Pontine calcification again noted. IMPRESSION: 1. No acute fracture or traumatic subluxation. 2. Stable multilevel degenerative changes.  Electronically Signed   By: Ronney Asters M.D.   On: 11/01/2021 23:19   CT MAXILLOFACIAL WO CONTRAST  Result Date: 11/01/2021 CLINICAL DATA:  Neck trauma (Age >= 65y).  Fall. EXAM: CT MAXILLOFACIAL WITHOUT CONTRAST TECHNIQUE: Multidetector CT imaging of the maxillofacial structures was performed. Multiplanar CT image reconstructions were also generated. RADIATION DOSE REDUCTION: This exam was performed according to the departmental dose-optimization program which includes automated exposure control, adjustment of the mA and/or kV according to patient size and/or use of iterative reconstruction technique. COMPARISON:  None Available. FINDINGS: Osseous: No fracture or mandibular dislocation. No destructive process. Orbits: No orbital fracture.  Globes intact. Sinuses: Clear Soft tissues: Negative Limited intracranial: See head CT report IMPRESSION: No facial or orbital fracture. Electronically Signed   By: Rolm Baptise M.D.   On: 11/01/2021 23:15   CT Head Wo Contrast  Result Date: 11/01/2021 CLINICAL DATA:  Head trauma, minor (Age >=  65y).  Fall EXAM: CT HEAD WITHOUT CONTRAST TECHNIQUE: Contiguous axial images were obtained from the base of the skull through the vertex without intravenous contrast. RADIATION DOSE REDUCTION: This exam was performed according to the departmental dose-optimization program which includes automated exposure control, adjustment of the mA and/or kV according to patient size and/or use of iterative reconstruction technique. COMPARISON:  09/25/2021 FINDINGS: Brain: There is atrophy and chronic small vessel disease changes. No acute intracranial abnormality. Specifically, no hemorrhage, hydrocephalus, mass lesion, acute infarction, or significant intracranial injury. Vascular: No hyperdense vessel or unexpected calcification. Skull: No acute calvarial abnormality. Sinuses/Orbits: No acute findings Other: None IMPRESSION: Atrophy, chronic microvascular disease. No acute intracranial  abnormality. Electronically Signed   By: Rolm Baptise M.D.   On: 11/01/2021 23:14   DG Ribs Unilateral W/Chest Left  Result Date: 11/01/2021 CLINICAL DATA:  88 year old post fall. Slipped out of a chair. Left lower anterior rib pain. EXAM: LEFT RIBS AND CHEST - 3+ VIEW COMPARISON:  Chest radiograph 09/24/2021 FINDINGS: No fracture or other bone lesions are seen involving the ribs. There is no evidence of pneumothorax. Unchanged blunting of left costophrenic angle. Both lungs are clear. Heart size and mediastinal contours are within normal limits. Advanced left shoulder arthropathy. IMPRESSION: No evidence of left rib fracture or pulmonary complication. Electronically Signed   By: Keith Rake M.D.   On: 11/01/2021 23:03   DG Pelvis 1-2 Views  Result Date: 11/01/2021 CLINICAL DATA:  Fall EXAM: PELVIS - 1-2 VIEW COMPARISON:  11/01/2020 FINDINGS: Prior right hip replacement. Moderate degenerative changes in the left hip and advanced degenerative changes in the visualized lower lumbar spine. No acute bony abnormality. Specifically, no fracture, subluxation, or dislocation. IMPRESSION: No acute bony abnormality. Electronically Signed   By: Rolm Baptise M.D.   On: 11/01/2021 23:03    Procedures Procedures    Medications Ordered in ED Medications  acetaminophen (TYLENOL) tablet 650 mg (650 mg Oral Patient Refused/Not Given 11/01/21 2317)    ED Course/ Medical Decision Making/ A&P                           Medical Decision Making Risk OTC drugs.   This patient presents to the ED with chief complaint(s) of fall. Patient is 88 year old.  She is not on any blood thinners.  She denies loss of consciousness.  She has visible trauma to her face and is having diffuse tenderness over her torso and extremity, but no gross deformity  The differential diagnosis includes : DDx includes: - Mechanical falls - ICH - Fractures - Contusions - Soft tissue injury   The initial plan is to order CT scan  of the brain, C-spine, face and appropriate radiographs.  Patient able to flex and extend over her knee and hip, I do not think knee x-rays are needed.   Additional history obtained: Additional history obtained from EMS    Independent visualization of imaging: - I independently visualized the following imaging with scope of interpretation limited to determining acute life threatening conditions related to emergency care: CT scan of the brain, which revealed no evidence of acute brain bleed  Patient's radiographs do not show any concerning fractures.  CT face is reassuring.  Patient will be ambulated, after which discharged Ice and Tylenol provided.  Final Clinical Impression(s) / ED Diagnoses Final diagnoses:  Multiple contusions    Rx / DC Orders ED Discharge Orders     None  Varney Biles, MD 11/01/21 [phone]

## 2021-11-02 DIAGNOSIS — I129 Hypertensive chronic kidney disease with stage 1 through stage 4 chronic kidney disease, or unspecified chronic kidney disease: Secondary | ICD-10-CM | POA: Diagnosis not present

## 2021-11-02 DIAGNOSIS — N189 Chronic kidney disease, unspecified: Secondary | ICD-10-CM | POA: Diagnosis not present

## 2021-11-02 DIAGNOSIS — E1122 Type 2 diabetes mellitus with diabetic chronic kidney disease: Secondary | ICD-10-CM | POA: Diagnosis not present

## 2021-11-02 DIAGNOSIS — M2550 Pain in unspecified joint: Secondary | ICD-10-CM | POA: Diagnosis not present

## 2021-11-02 DIAGNOSIS — M810 Age-related osteoporosis without current pathological fracture: Secondary | ICD-10-CM | POA: Diagnosis not present

## 2021-11-02 DIAGNOSIS — M25572 Pain in left ankle and joints of left foot: Secondary | ICD-10-CM | POA: Diagnosis not present

## 2021-11-02 DIAGNOSIS — Z7401 Bed confinement status: Secondary | ICD-10-CM | POA: Diagnosis not present

## 2021-11-02 DIAGNOSIS — M150 Primary generalized (osteo)arthritis: Secondary | ICD-10-CM | POA: Diagnosis not present

## 2021-11-02 DIAGNOSIS — W19XXXA Unspecified fall, initial encounter: Secondary | ICD-10-CM | POA: Diagnosis not present

## 2021-11-02 NOTE — ED Notes (Signed)
Attempted to call nursing facility, no answer.

## 2021-11-03 DIAGNOSIS — I129 Hypertensive chronic kidney disease with stage 1 through stage 4 chronic kidney disease, or unspecified chronic kidney disease: Secondary | ICD-10-CM | POA: Diagnosis not present

## 2021-11-03 DIAGNOSIS — E860 Dehydration: Secondary | ICD-10-CM | POA: Diagnosis not present

## 2021-11-03 DIAGNOSIS — N1831 Chronic kidney disease, stage 3a: Secondary | ICD-10-CM | POA: Diagnosis not present

## 2021-11-03 DIAGNOSIS — E8881 Metabolic syndrome: Secondary | ICD-10-CM | POA: Diagnosis not present

## 2021-11-03 DIAGNOSIS — M05741 Rheumatoid arthritis with rheumatoid factor of right hand without organ or systems involvement: Secondary | ICD-10-CM | POA: Diagnosis not present

## 2021-11-03 DIAGNOSIS — G478 Other sleep disorders: Secondary | ICD-10-CM | POA: Diagnosis not present

## 2021-11-07 DIAGNOSIS — E1122 Type 2 diabetes mellitus with diabetic chronic kidney disease: Secondary | ICD-10-CM | POA: Diagnosis not present

## 2021-11-07 DIAGNOSIS — M810 Age-related osteoporosis without current pathological fracture: Secondary | ICD-10-CM | POA: Diagnosis not present

## 2021-11-07 DIAGNOSIS — M150 Primary generalized (osteo)arthritis: Secondary | ICD-10-CM | POA: Diagnosis not present

## 2021-11-07 DIAGNOSIS — M25572 Pain in left ankle and joints of left foot: Secondary | ICD-10-CM | POA: Diagnosis not present

## 2021-11-07 DIAGNOSIS — I129 Hypertensive chronic kidney disease with stage 1 through stage 4 chronic kidney disease, or unspecified chronic kidney disease: Secondary | ICD-10-CM | POA: Diagnosis not present

## 2021-11-07 DIAGNOSIS — N189 Chronic kidney disease, unspecified: Secondary | ICD-10-CM | POA: Diagnosis not present

## 2021-11-07 DIAGNOSIS — N1831 Chronic kidney disease, stage 3a: Secondary | ICD-10-CM | POA: Diagnosis not present

## 2021-11-07 DIAGNOSIS — M1712 Unilateral primary osteoarthritis, left knee: Secondary | ICD-10-CM | POA: Diagnosis not present

## 2021-11-07 DIAGNOSIS — W19XXXA Unspecified fall, initial encounter: Secondary | ICD-10-CM | POA: Diagnosis not present

## 2021-11-08 DIAGNOSIS — R2242 Localized swelling, mass and lump, left lower limb: Secondary | ICD-10-CM | POA: Diagnosis not present

## 2021-11-08 DIAGNOSIS — M1712 Unilateral primary osteoarthritis, left knee: Secondary | ICD-10-CM | POA: Diagnosis not present

## 2021-11-08 DIAGNOSIS — M25572 Pain in left ankle and joints of left foot: Secondary | ICD-10-CM | POA: Diagnosis not present

## 2021-11-08 DIAGNOSIS — M79672 Pain in left foot: Secondary | ICD-10-CM | POA: Diagnosis not present

## 2021-11-08 DIAGNOSIS — M05741 Rheumatoid arthritis with rheumatoid factor of right hand without organ or systems involvement: Secondary | ICD-10-CM | POA: Diagnosis not present

## 2021-11-08 DIAGNOSIS — M7732 Calcaneal spur, left foot: Secondary | ICD-10-CM | POA: Diagnosis not present

## 2021-11-14 DIAGNOSIS — I129 Hypertensive chronic kidney disease with stage 1 through stage 4 chronic kidney disease, or unspecified chronic kidney disease: Secondary | ICD-10-CM | POA: Diagnosis not present

## 2021-11-14 DIAGNOSIS — M150 Primary generalized (osteo)arthritis: Secondary | ICD-10-CM | POA: Diagnosis not present

## 2021-11-14 DIAGNOSIS — N189 Chronic kidney disease, unspecified: Secondary | ICD-10-CM | POA: Diagnosis not present

## 2021-11-14 DIAGNOSIS — M810 Age-related osteoporosis without current pathological fracture: Secondary | ICD-10-CM | POA: Diagnosis not present

## 2021-11-14 DIAGNOSIS — M25572 Pain in left ankle and joints of left foot: Secondary | ICD-10-CM | POA: Diagnosis not present

## 2021-11-14 DIAGNOSIS — E1122 Type 2 diabetes mellitus with diabetic chronic kidney disease: Secondary | ICD-10-CM | POA: Diagnosis not present

## 2021-11-16 ENCOUNTER — Telehealth: Payer: Self-pay | Admitting: Podiatry

## 2021-11-16 NOTE — Telephone Encounter (Signed)
Pts daughter called checking on status of pts diabetic shoes they were ordered months ago and they have not heard anything.  Upon checking safestep we have not received the needed documents from pcp. Dr Inda Merlin sent back the form stating not his pt.  Pts daughter is calling the facility that pt is in and talking to the nurse there to get the doctors name and I have asked her to call me back and I can send the documents needed to that doctor.

## 2021-11-25 DIAGNOSIS — M150 Primary generalized (osteo)arthritis: Secondary | ICD-10-CM | POA: Diagnosis not present

## 2021-11-25 DIAGNOSIS — M25572 Pain in left ankle and joints of left foot: Secondary | ICD-10-CM | POA: Diagnosis not present

## 2021-11-25 DIAGNOSIS — N189 Chronic kidney disease, unspecified: Secondary | ICD-10-CM | POA: Diagnosis not present

## 2021-11-25 DIAGNOSIS — I129 Hypertensive chronic kidney disease with stage 1 through stage 4 chronic kidney disease, or unspecified chronic kidney disease: Secondary | ICD-10-CM | POA: Diagnosis not present

## 2021-11-25 DIAGNOSIS — E1122 Type 2 diabetes mellitus with diabetic chronic kidney disease: Secondary | ICD-10-CM | POA: Diagnosis not present

## 2021-11-25 DIAGNOSIS — M810 Age-related osteoporosis without current pathological fracture: Secondary | ICD-10-CM | POA: Diagnosis not present

## 2021-12-02 ENCOUNTER — Ambulatory Visit (HOSPITAL_COMMUNITY)
Admission: EM | Admit: 2021-12-02 | Discharge: 2021-12-02 | Disposition: A | Discharge status: Home / Self Care | Payer: Medicare Other | Attending: Urgent Care | Admitting: Urgent Care

## 2021-12-02 ENCOUNTER — Encounter (HOSPITAL_COMMUNITY): Payer: Self-pay | Admitting: Emergency Medicine

## 2021-12-02 DIAGNOSIS — M05741 Rheumatoid arthritis with rheumatoid factor of right hand without organ or systems involvement: Secondary | ICD-10-CM | POA: Diagnosis not present

## 2021-12-02 DIAGNOSIS — G4719 Other hypersomnia: Secondary | ICD-10-CM | POA: Diagnosis not present

## 2021-12-02 DIAGNOSIS — R531 Weakness: Secondary | ICD-10-CM | POA: Insufficient documentation

## 2021-12-02 DIAGNOSIS — M19019 Primary osteoarthritis, unspecified shoulder: Secondary | ICD-10-CM | POA: Diagnosis not present

## 2021-12-02 LAB — CBC WITH DIFFERENTIAL/PLATELET
Abs Immature Granulocytes: 0.04 10*3/uL (ref 0.00–0.07)
Basophils Absolute: 0 10*3/uL (ref 0.0–0.1)
Basophils Relative: 0 %
Eosinophils Absolute: 0.2 10*3/uL (ref 0.0–0.5)
Eosinophils Relative: 2 %
HCT: 36.5 % (ref 36.0–46.0)
Hemoglobin: 12.4 g/dL (ref 12.0–15.0)
Immature Granulocytes: 0 %
Lymphocytes Relative: 9 %
Lymphs Abs: 1.1 10*3/uL (ref 0.7–4.0)
MCH: 32.5 pg (ref 26.0–34.0)
MCHC: 34 g/dL (ref 30.0–36.0)
MCV: 95.8 fL (ref 80.0–100.0)
Monocytes Absolute: 1 10*3/uL (ref 0.1–1.0)
Monocytes Relative: 8 %
Neutro Abs: 9.9 10*3/uL — ABNORMAL HIGH (ref 1.7–7.7)
Neutrophils Relative %: 81 %
Platelets: 287 10*3/uL (ref 150–400)
RBC: 3.81 MIL/uL — ABNORMAL LOW (ref 3.87–5.11)
RDW: 12.5 % (ref 11.5–15.5)
WBC: 12.3 10*3/uL — ABNORMAL HIGH (ref 4.0–10.5)
nRBC: 0 % (ref 0.0–0.2)

## 2021-12-02 LAB — COMPREHENSIVE METABOLIC PANEL
ALT: 17 U/L (ref 0–44)
AST: 21 U/L (ref 15–41)
Albumin: 3.8 g/dL (ref 3.5–5.0)
Alkaline Phosphatase: 62 U/L (ref 38–126)
Anion gap: 8 (ref 5–15)
BUN: 24 mg/dL — ABNORMAL HIGH (ref 8–23)
CO2: 25 mmol/L (ref 22–32)
Calcium: 9.4 mg/dL (ref 8.9–10.3)
Chloride: 97 mmol/L — ABNORMAL LOW (ref 98–111)
Creatinine, Ser: 1.23 mg/dL — ABNORMAL HIGH (ref 0.44–1.00)
GFR, Estimated: 40 mL/min — ABNORMAL LOW (ref >60)
Glucose, Bld: 132 mg/dL — ABNORMAL HIGH (ref 70–99)
Potassium: 4.5 mmol/L (ref 3.5–5.1)
Sodium: 130 mmol/L — ABNORMAL LOW (ref 135–145)
Total Bilirubin: 0.6 mg/dL (ref 0.3–1.2)
Total Protein: 6.3 g/dL — ABNORMAL LOW (ref 6.5–8.1)

## 2021-12-02 LAB — POCT URINALYSIS DIPSTICK, ED / UC
Bilirubin Urine: NEGATIVE
Glucose, UA: NEGATIVE mg/dL
Ketones, ur: NEGATIVE mg/dL
Leukocytes,Ua: NEGATIVE
Nitrite: NEGATIVE
Protein, ur: NEGATIVE mg/dL
Specific Gravity, Urine: 1.015 (ref 1.005–1.030)
Urobilinogen, UA: 0.2 mg/dL (ref 0.0–1.0)
pH: 5 (ref 5.0–8.0)

## 2021-12-02 LAB — CBG MONITORING, ED: Glucose-Capillary: 138 mg/dL — ABNORMAL HIGH (ref 70–99)

## 2021-12-02 MED ORDER — AZELASTINE HCL 0.1 % NA SOLN: 1.0000 | Freq: Two times a day (BID) | NASAL | 0 refills | Status: AC

## 2021-12-02 NOTE — ED Provider Notes (Signed)
[facility]    CSN: [id number] Arrival date & time: 12/02/21  1506      History   Chief Complaint Chief Complaint  Patient presents with   Nausea   Dizziness   Fall    HPI [name] is a 88 y.o. female.   Pleasant 88 year old female presents today with her 2 daughters due to concerns of "feeling bad".  This started yesterday.  Patient states she was sitting at lunch and tried to stand up.  She has crippling arthritis, and felt that her knees buckled, causing her to slide from her chair to the floor.  She denies any head trauma.  She lives in assisted living facility and states that the nurses came and took her vital signs and glucose, both of which were normal.  Patient continues to feel "bad".  She reports chronic intermittent abdominal discomfort that causes intermittent nausea.  She states she is having the symptoms today, but did not have it yesterday.  This has been longstanding in an intermittent fashion for many years.  She states she had a work-up in the past but it is unknown the cause.  She was able to eat lunch today without any issues.  Patient additionally reports concerns with her eyes.  She states they feel heavy, but also states that this has been going on intermittently for years.  She denies any new ocular symptoms today.  Daughters are primarily concern for possible UTI.  They state her mother does not drink much fluids and is on a medication, Myrbetriq, for urinary frequency.  They also report that she is chronically fatigued as she gets up numerous times at night and does not sleep very well.  Patient originally had noted dizziness as her chief complaint, but is denying any active dizziness today.   Dizziness Associated symptoms: weakness   Associated symptoms: no chest pain and no palpitations   Fall Pertinent negatives include no chest pain.    Past Medical History:  Diagnosis Date   Acute pharyngitis    Acute sinusitis, unspecified    Acute  upper respiratory infections of unspecified site    Allergy    Anal and rectal polyp    BCC (basal cell carcinoma) 05/18/2021   right nasal sidewall, radiaiton completed 07/29/21   Cataract    History of   Cavernous angioma    Degenerative cervical disc 02/22/2006   Diabetes mellitus    Type II   GERD (gastroesophageal reflux disease)    Gout    Hemangioma of unspecified site    Hyperlipidemia    Hypertension    Orthostatic hypotension 04/24/2006   Hospital   Osteoarthritis    Osteopenia    Other abscess of vulva    Palpitations    SCC (squamous cell carcinoma) 05/18/2021   left dorsal hand over MCP, EDC 06/22/21   Squamous cell carcinoma in situ (SCCIS) 05/18/2021   right lower leg medial, EDC 06/22/21   Syncope and collapse    1 episode    Patient Active Problem List   Diagnosis Date Noted   Basal cell carcinoma of nose 06/14/2021   Tinea pedis of both feet 12/06/2020   Hip fracture ([place]) 11/01/2020   Closed displaced fracture of right femoral neck ([place]) 10/29/2020   Discharge of eye, left 10/29/2020   Elevated serum creatinine 10/29/2020   Sprain of anterior talofibular ligament of left ankle 06/22/2020   Acute cystitis without hematuria 05/10/2020   History of colonic polyps 04/28/2020  Urinary frequency 12/16/2019   Cough, persistent 11/06/2019   Acute neck pain 09/26/2019   Seborrheic dermatitis of scalp 09/26/2019   Nocturia 08/22/2019   Leukocytosis 06/13/2018   Seborrheic keratoses 10/16/2017   Hyponatremia 09/14/2017   Chronic left upper quadrant pain 08/07/2017   Right hip pain 07/12/2017   Hearing loss 02/20/2017   TIA (transient ischemic attack), possible 11/14/2016   Left carpal tunnel syndrome 08/11/2016   Fatigue 06/15/2016   Constipation, chronic 03/21/2016   Near syncope 05/07/2015   Osteoarthritis of right shoulder 05/04/2015   Counseling regarding end of life decision making 01/15/2015   Skin lesion of right leg 07/10/2014   Insomnia  04/10/2014   Shakiness 12/16/2013   Osteoarthritis of right knee 10/14/2013   Osteoarthritis of left knee 10/14/2013   Infected sebaceous cyst 11/29/2012   Gout, chronic, without tophus 12/13/2009   Osteoporosis 10/29/2009   PALPITATIONS, OCCASIONAL 07/07/2008   Allergic rhinitis 06/02/2008   HEMANGIOMA 07/30/2006   Controlled type 2 diabetes mellitus without complication, without long-term current use of insulin ([place]) 07/30/2006   Hyperlipidemia LDL goal <100 07/30/2006   Essential hypertension, benign 07/30/2006   RECTAL POLYPS 07/30/2006   Osteoarthritis 07/30/2006   CATARACT, HX OF 07/30/2006    Past Surgical History:  Procedure Laterality Date   ABDOMINAL HYSTERECTOMY     Aortic dopplers     Mild plaque.  EEG okay   CATARACT EXTRACTION     Cavernous angioma     Cavernous hemangioma  04/2004   Hospital   TOTAL HIP ARTHROPLASTY Right 11/01/2020   Procedure: TOTAL HIP ARTHROPLASTY ANTERIOR APPROACH;  Surgeon: Gaynelle Arabian, MD;  Location: [facility];  Service: Orthopedics;  Laterality: Right;   TOTAL KNEE ARTHROPLASTY     Right    OB History   No obstetric history on file.      Home Medications    Prior to Admission medications   Medication Sig Start Date End Date Taking? Authorizing Provider  azelastine (ASTELIN) 0.1 % nasal spray Place 1 spray into both nostrils 2 (two) times daily. Use in each nostril as directed 12/02/21  Yes Xiadani Damman L, PA  Accu-Chek Softclix Lancets lancets CHECK BLOOD SUGAR 1 TIME A DAY. DX: E11.9 03/22/20   Bedsole, Amy E, MD  aspirin EC 81 MG tablet Take 81 mg by mouth daily. Swallow whole.    [provider]  Blood Glucose Monitoring Suppl (ACCU-CHEK GUIDE) w/Device KIT 1 each by Does not apply route daily. Use to check blood sugar daily as needed.  Dx: E11.9 03/22/20   Bedsole, Amy E, MD  fluticasone (FLONASE) 50 MCG/ACT nasal spray PLACE 2 SPRAYS INTO BOTH NOSTRILS DAILY AS NEEDED FOR ALLERGIES. 06/10/20   Bedsole, Amy E, MD   glucose blood (ACCU-CHEK GUIDE) test strip Use to check blood sugar 1 time daily 03/22/20   Bedsole, Amy E, MD  Loratadine 10 MG CAPS Take 1 capsule by mouth daily.    [provider]  Multiple Vitamin (MULTIVITAMIN PO) Take 1 tablet by mouth daily.    [provider]  MYRBETRIQ 25 MG TB24 tablet Take 25 mg by mouth daily. 05/05/21   [provider]  Propylene Glycol (SYSTANE BALANCE) 0.6 % SOLN Place 1-2 drops into both eyes daily.    [provider]  ramipril (ALTACE) 5 MG capsule TAKE 1 CAPSULE BY MOUTH EVERY DAY 12/03/20   Bedsole, Amy E, MD  SALINE NASAL SPRAY NA Place 2 sprays into the nose every 6 (six) hours as  needed (nasal stuffiness).    [provider]    Family History Family History  Problem Relation Age of Onset   Heart disease Other        CHF   Cancer Maternal Aunt        Breast CA    Social History Social History   Tobacco Use   Smoking status: Former   Smokeless tobacco: Never   Tobacco comments:    quit 1970  Vaping Use   Vaping Use: Never used  Substance Use Topics   Alcohol use: Not Currently    Comment: wine rarely   Drug use: No     Allergies   Codeine, Other, and Statins   Review of Systems Review of Systems  Constitutional:  Positive for fatigue. Negative for activity change, appetite change and fever.  Respiratory:  Negative for apnea and cough.   Cardiovascular:  Negative for chest pain and palpitations.  Neurological:  Positive for weakness. Negative for dizziness, syncope, facial asymmetry, speech difficulty and light-headedness.     Physical Exam Triage Vital Signs ED Triage Vitals [12/02/21 1531]  Enc Vitals Group     BP (!) 149/82     Pulse Rate 80     Resp 19     Temp 97.9 F (36.6 C)     Temp Source Oral     SpO2 95 %     Weight      Height      Head Circumference      Peak Flow      Pain Score      Pain Loc      Pain Edu?      Excl. in [place]?    No data found.  Updated  Vital Signs BP (!) 149/82 (BP Location: Left Arm)   Pulse 80   Temp 97.9 F (36.6 C) (Oral)   Resp 19   SpO2 95%   Visual Acuity Right Eye Distance:   Left Eye Distance:   Bilateral Distance:    Right Eye Near:   Left Eye Near:    Bilateral Near:     Physical Exam Vitals and nursing note reviewed. Exam conducted with a chaperone present.  Constitutional:      General: She is not in acute distress.    Appearance: Normal appearance. She is normal weight. She is not ill-appearing, toxic-appearing or diaphoretic.  HENT:     Head: Normocephalic and atraumatic.     Right Ear: Tympanic membrane, ear canal and external ear normal. There is no impacted cerumen.     Left Ear: Tympanic membrane, ear canal and external ear normal. There is no impacted cerumen.  Cardiovascular:     Rate and Rhythm: Normal rate and regular rhythm.     Pulses: Normal pulses.     Heart sounds: No murmur heard. Pulmonary:     Effort: Pulmonary effort is normal. No respiratory distress.     Breath sounds: Normal breath sounds. No stridor. No wheezing, rhonchi or rales.  Chest:     Chest wall: No tenderness.  Abdominal:     General: Abdomen is flat.     Palpations: Abdomen is soft.     Tenderness: There is abdominal tenderness (bilateral upper quads, epigastric- mild).  Musculoskeletal:     Cervical back: Normal range of motion and neck supple. No rigidity or tenderness.     Right lower leg: No edema.     Left lower leg: No edema.  Lymphadenopathy:  Cervical: No cervical adenopathy.  Neurological:     General: No focal deficit present.     Mental Status: She is alert and oriented to person, place, and time. Mental status is at baseline.     Cranial Nerves: Cranial nerves 2-12 are intact. No cranial nerve deficit, dysarthria or facial asymmetry.     Sensory: Sensation is intact. No sensory deficit.     Motor: Motor function is intact. No weakness, tremor or seizure activity.     Coordination:  Coordination is intact. Finger-Nose-Finger Test normal.     Comments: Decreased muscular tone/ atrophy      UC Treatments / Results  Labs (all labs ordered are listed, but only abnormal results are displayed) Labs Reviewed  CBG MONITORING, ED - Abnormal; Notable for the following components:      Result Value   Glucose-Capillary 138 (*)    All other components within normal limits  POCT URINALYSIS DIPSTICK, ED / UC - Abnormal; Notable for the following components:   Hgb urine dipstick TRACE (*)    All other components within normal limits  COMPREHENSIVE METABOLIC PANEL  CBC WITH DIFFERENTIAL/PLATELET    EKG   Radiology No results found.  Procedures ED EKG  Date/Time: 12/02/2021 4:27 PM  Performed by: Chaney Malling, PA Authorized by: Chaney Malling, PA   Previous ECG:    Previous ECG:  Compared to current   Similarity:  No change Interpretation:    Interpretation: abnormal     Details:  NSR, LVH with repolarization abnormality Rate:    ECG rate assessment: normal   Rhythm:    Rhythm: sinus rhythm   Ectopy:    Ectopy: none    (including critical care time)  Medications Ordered in UC Medications - No data to display  Initial Impression / Assessment and Plan / UC Course  I have reviewed the triage vital signs and the nursing notes.  Pertinent labs & imaging results that were available during my care of the patient were reviewed by me and considered in my medical decision making (see chart for details).  Clinical Course as of 12/02/21 1748  Fri Dec 02, 2021  1654 Recheck O2 97% [WC]    Clinical Course User Index [WC] [place], Cattaleya Wien L, PA    Weakness generalized -patient overall appears well.  Work-up thus far negative.  Will obtain CMP and CBC, will call with the results.  Upon discharge, patient stated to myself that she felt better.  I question if she just has general asthenia due to deconditioning.  It also appears that she does not eat or drink much,  and sleeps poorly at night.  This is all likely contributing to her weakness. Excessive daytime sleepiness -follow-up with PCP, consider 5 mg melatonin at night. Stop claritin, switch to azelastine nasal spray to see if this is causing drowsiness. Belching -discussed air gulping, chewing food, drinking plenty of liquids between bites.  Consider having an H. pylori work-up by PCP.  Final Clinical Impressions(s) / UC Diagnoses   Final diagnoses:  Weakness generalized  Excessive daytime sleepiness     Discharge Instructions      EKG was unremarkable. UA was normal Glucose was also normal. We drew a CBC And CMP, will call with the results. Please STOP the claritin to see if this is causing any of the drowsiness. Instead, try the azelastine prescribed today. Discussed obtaining an H pylori test to ensure your bloating is not due to this. Chew your few and  drink plenty of water after each sip, this can help prevent air gulping. OTC gas x or simethicone can help with this if needed. Also discuss possibly trying 65m of melatonin at night to see if this can promote more restful sleep. Physical therapy may help prevent further deconditioning.     ED Prescriptions     Medication Sig Dispense Auth. Provider   azelastine (ASTELIN) 0.1 % nasal spray Place 1 spray into both nostrils 2 (two) times daily. Use in each nostril as directed 30 mL Naliyah Neth L, PA      PDMP not reviewed this encounter.   CChaney Malling PUtah08/11/23 1751

## 2021-12-02 NOTE — Discharge Instructions (Addendum)
EKG was unremarkable. UA was normal Glucose was also normal. We drew a CBC And CMP, will call with the results. Please STOP the claritin to see if this is causing any of the drowsiness. Instead, try the azelastine prescribed today. Discussed obtaining an H pylori test to ensure your bloating is not due to this. Chew your few and drink plenty of water after each sip, this can help prevent air gulping. OTC gas x or simethicone can help with this if needed. Also discuss possibly trying '5mg'$  of melatonin at night to see if this can promote more restful sleep. Physical therapy may help prevent further deconditioning.

## 2021-12-02 NOTE — ED Triage Notes (Signed)
Pt presents with family.  Pt reports dizziness and nausea since yesterday, also reports falling yesterday morning. Staff took vitals signs and blood sugar and both were WNL.  also c/o abdominal pain beginning an hr ago.

## 2021-12-05 DIAGNOSIS — I129 Hypertensive chronic kidney disease with stage 1 through stage 4 chronic kidney disease, or unspecified chronic kidney disease: Secondary | ICD-10-CM | POA: Diagnosis not present

## 2021-12-05 DIAGNOSIS — J302 Other seasonal allergic rhinitis: Secondary | ICD-10-CM | POA: Diagnosis not present

## 2021-12-05 DIAGNOSIS — N1831 Chronic kidney disease, stage 3a: Secondary | ICD-10-CM | POA: Diagnosis not present

## 2021-12-07 DIAGNOSIS — B351 Tinea unguium: Secondary | ICD-10-CM | POA: Diagnosis not present

## 2021-12-07 DIAGNOSIS — E1140 Type 2 diabetes mellitus with diabetic neuropathy, unspecified: Secondary | ICD-10-CM | POA: Diagnosis not present

## 2021-12-07 DIAGNOSIS — L603 Nail dystrophy: Secondary | ICD-10-CM | POA: Diagnosis not present

## 2021-12-07 DIAGNOSIS — M79675 Pain in left toe(s): Secondary | ICD-10-CM | POA: Diagnosis not present

## 2021-12-07 DIAGNOSIS — R269 Unspecified abnormalities of gait and mobility: Secondary | ICD-10-CM | POA: Diagnosis not present

## 2021-12-07 DIAGNOSIS — M79674 Pain in right toe(s): Secondary | ICD-10-CM | POA: Diagnosis not present

## 2021-12-07 DIAGNOSIS — L84 Corns and callosities: Secondary | ICD-10-CM | POA: Diagnosis not present

## 2021-12-12 DIAGNOSIS — M1712 Unilateral primary osteoarthritis, left knee: Secondary | ICD-10-CM | POA: Diagnosis not present

## 2021-12-12 DIAGNOSIS — M19011 Primary osteoarthritis, right shoulder: Secondary | ICD-10-CM | POA: Diagnosis not present

## 2021-12-13 DIAGNOSIS — H04123 Dry eye syndrome of bilateral lacrimal glands: Secondary | ICD-10-CM | POA: Diagnosis not present

## 2021-12-13 DIAGNOSIS — H43393 Other vitreous opacities, bilateral: Secondary | ICD-10-CM | POA: Diagnosis not present

## 2021-12-13 DIAGNOSIS — H35363 Drusen (degenerative) of macula, bilateral: Secondary | ICD-10-CM | POA: Diagnosis not present

## 2021-12-13 DIAGNOSIS — Z961 Presence of intraocular lens: Secondary | ICD-10-CM | POA: Diagnosis not present

## 2021-12-13 DIAGNOSIS — E119 Type 2 diabetes mellitus without complications: Secondary | ICD-10-CM | POA: Diagnosis not present

## 2021-12-28 ENCOUNTER — Ambulatory Visit (INDEPENDENT_AMBULATORY_CARE_PROVIDER_SITE_OTHER): Payer: Medicare Other | Admitting: Dermatology

## 2021-12-28 DIAGNOSIS — Z85828 Personal history of other malignant neoplasm of skin: Secondary | ICD-10-CM

## 2021-12-28 DIAGNOSIS — Z1283 Encounter for screening for malignant neoplasm of skin: Secondary | ICD-10-CM

## 2021-12-28 DIAGNOSIS — C44622 Squamous cell carcinoma of skin of right upper limb, including shoulder: Secondary | ICD-10-CM | POA: Diagnosis not present

## 2021-12-28 DIAGNOSIS — L570 Actinic keratosis: Secondary | ICD-10-CM | POA: Diagnosis not present

## 2021-12-28 DIAGNOSIS — D0462 Carcinoma in situ of skin of left upper limb, including shoulder: Secondary | ICD-10-CM

## 2021-12-28 DIAGNOSIS — L578 Other skin changes due to chronic exposure to nonionizing radiation: Secondary | ICD-10-CM

## 2021-12-28 DIAGNOSIS — D1801 Hemangioma of skin and subcutaneous tissue: Secondary | ICD-10-CM | POA: Diagnosis not present

## 2021-12-28 DIAGNOSIS — L814 Other melanin hyperpigmentation: Secondary | ICD-10-CM

## 2021-12-28 DIAGNOSIS — D229 Melanocytic nevi, unspecified: Secondary | ICD-10-CM | POA: Diagnosis not present

## 2021-12-28 DIAGNOSIS — L821 Other seborrheic keratosis: Secondary | ICD-10-CM

## 2021-12-28 DIAGNOSIS — D492 Neoplasm of unspecified behavior of bone, soft tissue, and skin: Secondary | ICD-10-CM

## 2021-12-28 NOTE — Progress Notes (Signed)
Follow-Up Visit   Subjective  [name] is a 88 y.o. female who presents for the following: FBSE (The patient presents for Total-Body Skin Exam (TBSE) for skin cancer screening and mole check.  The patient has spots, moles and lesions to be evaluated, some may be new or changing and the patient has concerns that these could be cancer. Patient with hx of BCC and SCC. ).  Patient accompanied by daughter, [name].   The following portions of the chart were reviewed this encounter and updated as appropriate:   Tobacco  Allergies  Meds  Problems  Med Hx  Surg Hx  Fam Hx      Review of Systems:  No other skin or systemic complaints except as noted in HPI or Assessment and Plan.  Objective  Well appearing patient in no apparent distress; mood and affect are within normal limits.  A full examination was performed including scalp, head, eyes, ears, nose, lips, neck, chest, axillae, abdomen, back, buttocks, bilateral upper extremities, bilateral lower extremities, hands, feet, fingers, toes, fingernails, and toenails. All findings within normal limits unless otherwise noted below.  left superior shoulder 0.4 cm scaly pink papule R/o SCC vs ISK     Right Upper Arm 0.8 cm thick crusted papule R/o SCC       right forearm x 2, right dorsal hand x 1, right shoulder x 1, right upper arm x 1, left calf x 1 (6) Erythematous thin papules/macules with gritty scale.  With cutaneous horn at right forearm x 2 Hypertrophic at left calf    Assessment & Plan  Neoplasm of skin (2) left superior shoulder  Skin / nail biopsy Type of biopsy: tangential   Informed consent: discussed and consent obtained   Timeout: patient name, date of birth, surgical site, and procedure verified   Procedure prep:  Patient was prepped and draped in usual sterile fashion Prep type:  Isopropyl alcohol Anesthesia: the lesion was anesthetized in a standard fashion   Anesthetic:  1% lidocaine w/  epinephrine 1-100,000 buffered w/ 8.4% NaHCO3 Instrument used: flexible razor blade   Hemostasis achieved with: aluminum chloride   Outcome: patient tolerated procedure well   Post-procedure details: wound care instructions given   Additional details:  Petrolatum and a pressure bandage applied  Specimen 1 - Surgical pathology Differential Diagnosis: R/o SCC vs ISK  Check Margins: No 0.4 cm scaly pink papule   Right Upper Arm  Skin / nail biopsy Type of biopsy: tangential   Informed consent: discussed and consent obtained   Timeout: patient name, date of birth, surgical site, and procedure verified   Procedure prep:  Patient was prepped and draped in usual sterile fashion Prep type:  Isopropyl alcohol Anesthesia: the lesion was anesthetized in a standard fashion   Anesthetic:  1% lidocaine w/ epinephrine 1-100,000 buffered w/ 8.4% NaHCO3 Instrument used: flexible razor blade   Hemostasis achieved with: aluminum chloride   Outcome: patient tolerated procedure well   Post-procedure details: wound care instructions given   Additional details:  Petrolatum and a pressure bandage applied  Specimen 2 - Surgical pathology Differential Diagnosis: R/o SCC  Check Margins: No 0.8 cm thick crusted papule   AK (actinic keratosis) (6) right forearm x 2, right dorsal hand x 1, right shoulder x 1, right upper arm x 1, left calf x 1  Actinic keratoses are precancerous spots that appear secondary to cumulative UV radiation exposure/sun exposure over time. They are chronic with expected duration over 1 year.  A portion of actinic keratoses will progress to squamous cell carcinoma of the skin. It is not possible to reliably predict which spots will progress to skin cancer and so treatment is recommended to prevent development of skin cancer.  Recommend daily broad spectrum sunscreen SPF 30+ to sun-exposed areas, reapply every 2 hours as needed.  Recommend staying in the shade or wearing long  sleeves, sun glasses (UVA+UVB protection) and wide brim hats (4-inch brim around the entire circumference of the hat). Call for new or changing lesions.  Prior to procedure, discussed risks of blister formation, small wound, skin dyspigmentation, or rare scar following cryotherapy. Recommend Vaseline ointment to treated areas while healing.  Will bring patient back after birthday celebration to treat face.  Destruction of lesion - right forearm x 2, right dorsal hand x 1, right shoulder x 1, right upper arm x 1, left calf x 1  Destruction method: cryotherapy   Informed consent: discussed and consent obtained   Lesion destroyed using liquid nitrogen: Yes   Cryotherapy cycles:  2 Outcome: patient tolerated procedure well with no complications   Post-procedure details: wound care instructions given     History of Basal Cell Carcinoma of the Skin - No evidence of recurrence today - Recommend regular full body skin exams - Recommend daily broad spectrum sunscreen SPF 30+ to sun-exposed areas, reapply every 2 hours as needed.  - Call if any new or changing lesions are noted between office visits  History of Squamous Cell Carcinoma of the Skin - No evidence of recurrence today - No lymphadenopathy - Recommend regular full body skin exams - Recommend daily broad spectrum sunscreen SPF 30+ to sun-exposed areas, reapply every 2 hours as needed.  - Call if any new or changing lesions are noted between office visits  Lentigines - Scattered tan macules - Due to sun exposure - Benign-appearing, observe - Recommend daily broad spectrum sunscreen SPF 30+ to sun-exposed areas, reapply every 2 hours as needed. - Call for any changes  Seborrheic Keratoses - Stuck-on, waxy, tan-brown papules and/or plaques  - Benign-appearing - Discussed benign etiology and prognosis. - Observe - Call for any changes  Melanocytic Nevi - Tan-brown and/or pink-flesh-colored symmetric macules and papules -  Benign appearing on exam today - Observation - Call clinic for new or changing moles - Recommend daily use of broad spectrum spf 30+ sunscreen to sun-exposed areas.   Hemangiomas - Red papules - Discussed benign nature - Observe - Call for any changes  Actinic Damage - Severe, confluent actinic changes with pre-cancerous actinic keratoses  - Severe, chronic, not at goal, secondary to cumulative UV radiation exposure over time - diffuse scaly erythematous macules and papules with underlying dyspigmentation - Discussed Prescription "Field Treatment" for Severe, Chronic Confluent Actinic Changes with Pre-Cancerous Actinic Keratoses Field treatment involves treatment of an entire area of skin that has confluent Actinic Changes (Sun/ Ultraviolet light damage) and PreCancerous Actinic Keratoses by method of PhotoDynamic Therapy (PDT) and/or prescription Topical Chemotherapy agents such as 5-fluorouracil, 5-fluorouracil/calcipotriene, and/or imiquimod.  The purpose is to decrease the number of clinically evident and subclinical PreCancerous lesions to prevent progression to development of skin cancer by chemically destroying early precancer changes that may or may not be visible.  It has been shown to reduce the risk of developing skin cancer in the treated area. As a result of treatment, redness, scaling, crusting, and open sores may occur during treatment course. One or more than one of these methods may be used  and may have to be used several times to control, suppress and eliminate the PreCancerous changes. Discussed treatment course, expected reaction, and possible side effects. - Recommend daily broad spectrum sunscreen SPF 30+ to sun-exposed areas, reapply every 2 hours as needed.  - Staying in the shade or wearing long sleeves, sun glasses (UVA+UVB protection) and wide brim hats (4-inch brim around the entire circumference of the hat) are also recommended. - Call for new or changing lesions. -  Consider treating smaller areas at face with imiquimod - Discussed treating with [place] PDT, deferred at this time  Skin cancer screening performed today.  Return in about 2 months (around 02/27/2022) for treat AK's at face.  Graciella Belton, RMA, am acting as scribe for Forest Gleason, MD .  Documentation: I have reviewed the above documentation for accuracy and completeness, and I agree with the above.  Forest Gleason, MD

## 2021-12-28 NOTE — Patient Instructions (Addendum)
Cryotherapy Aftercare  Wash gently with soap and water everyday.   Apply Vaseline and Band-Aid daily until healed.    Wound Care Instructions  Cleanse wound gently with soap and water once a day then pat dry with clean gauze. Apply a thin coat of Petrolatum (petroleum jelly, "Vaseline") over the wound (unless you have an allergy to this). We recommend that you use a new, sterile tube of Vaseline. Do not pick or remove scabs. Do not remove the yellow or white "healing tissue" from the base of the wound.  Cover the wound with fresh, clean, nonstick gauze and secure with paper tape. You may use Band-Aids in place of gauze and tape if the wound is small enough, but would recommend trimming much of the tape off as there is often too much. Sometimes Band-Aids can irritate the skin.  You should call the office for your biopsy report after 1 week if you have not already been contacted.  If you experience any problems, such as abnormal amounts of bleeding, swelling, significant bruising, significant pain, or evidence of infection, please call the office immediately.  FOR ADULT SURGERY PATIENTS: If you need something for pain relief you may take 1 extra strength Tylenol (acetaminophen) AND 2 Ibuprofen (200mg each) together every 4 hours as needed for pain. (do not take these if you are allergic to them or if you have a reason you should not take them.) Typically, you may only need pain medication for 1 to 3 days.   Melanoma ABCDEs  Melanoma is the most dangerous type of skin cancer, and is the leading cause of death from skin disease.  You are more likely to develop melanoma if you: Have light-colored skin, light-colored eyes, or red or blond hair Spend a lot of time in the sun Tan regularly, either outdoors or in a tanning bed Have had blistering sunburns, especially during childhood Have a close family member who has had a melanoma Have atypical moles or large birthmarks  Early detection of  melanoma is key since treatment is typically straightforward and cure rates are extremely high if we catch it early.   The first sign of melanoma is often a change in a mole or a new dark spot.  The ABCDE system is a way of remembering the signs of melanoma.  A for asymmetry:  The two halves do not match. B for border:  The edges of the growth are irregular. C for color:  A mixture of colors are present instead of an even brown color. D for diameter:  Melanomas are usually (but not always) greater than 6mm - the size of a pencil eraser. E for evolution:  The spot keeps changing in size, shape, and color.  Please check your skin once per month between visits. You can use a small mirror in front and a large mirror behind you to keep an eye on the back side or your body.   If you see any new or changing lesions before your next follow-up, please call to schedule a visit.  Please continue daily skin protection including broad spectrum sunscreen SPF 30+ to sun-exposed areas, reapplying every 2 hours as needed when you're outdoors.    Due to recent changes in healthcare laws, you may see results of your pathology and/or laboratory studies on MyChart before the doctors have had a chance to review them. We understand that in some cases there may be results that are confusing or concerning to you. Please understand that not all   results are received at the same time and often the doctors may need to interpret multiple results in order to provide you with the best plan of care or course of treatment. Therefore, we ask that you please give us 2 business days to thoroughly review all your results before contacting the office for clarification. Should we see a critical lab result, you will be contacted sooner.   If You Need Anything After Your Visit  If you have any questions or concerns for your doctor, please call our main line at [phone] and press option 4 to reach your doctor's medical assistant. If  no one answers, please leave a voicemail as directed and we will return your call as soon as possible. Messages left after 4 pm will be answered the following business day.   You may also send us a message via MyChart. We typically respond to MyChart messages within 1-2 business days.  For prescription refills, please ask your pharmacy to contact our office. Our fax number is [phone].  If you have an urgent issue when the clinic is closed that cannot wait until the next business day, you can page your doctor at the number below.    Please note that while we do our best to be available for urgent issues outside of office hours, we are not available 24/7.   If you have an urgent issue and are unable to reach us, you may choose to seek medical care at your doctor's office, retail clinic, urgent care center, or emergency room.  If you have a medical emergency, please immediately call 911 or go to the emergency department.  Pager Numbers  - Dr. Kowalski: [phone]  - Dr. Moye: [phone]  - Dr. Stewart: [phone]  In the event of inclement weather, please call our main line at [phone] for an update on the status of any delays or closures.  Dermatology Medication Tips: Please keep the boxes that topical medications come in in order to help keep track of the instructions about where and how to use these. Pharmacies typically print the medication instructions only on the boxes and not directly on the medication tubes.   If your medication is too expensive, please contact our office at [phone] option 4 or send us a message through MyChart.   We are unable to tell what your co-pay for medications will be in advance as this is different depending on your insurance coverage. However, we may be able to find a substitute medication at lower cost or fill out paperwork to get insurance to cover a needed medication.   If a prior authorization is required to get your medication  covered by your insurance company, please allow us 1-2 business days to complete this process.  Drug prices often vary depending on where the prescription is filled and some pharmacies may offer cheaper prices.  The website www.goodrx.com contains coupons for medications through different pharmacies. The prices here do not account for what the cost may be with help from insurance (it may be cheaper with your insurance), but the website can give you the price if you did not use any insurance.  - You can print the associated coupon and take it with your prescription to the pharmacy.  - You may also stop by our office during regular business hours and pick up a GoodRx coupon card.  - If you need your prescription sent electronically to a different pharmacy, notify our office through [facility] MyChart or by phone at [phone]   option 4.     Si Usted Necesita Algo Despus de Su Visita  Tambin puede enviarnos un mensaje a travs de MyChart. Por lo general respondemos a los mensajes de MyChart en el transcurso de 1 a 2 das hbiles.  Para renovar recetas, por favor pida a su farmacia que se ponga en contacto con nuestra oficina. Nuestro nmero de fax es el [phone].  Si tiene un asunto urgente cuando la clnica est cerrada y que no puede esperar hasta el siguiente da hbil, puede llamar/localizar a su doctor(a) al nmero que aparece a continuacin.   Por favor, tenga en cuenta que aunque hacemos todo lo posible para estar disponibles para asuntos urgentes fuera del horario de oficina, no estamos disponibles las 24 horas del da, los 7 das de la semana.   Si tiene un problema urgente y no puede comunicarse con nosotros, puede optar por buscar atencin mdica  en el consultorio de su doctor(a), en una clnica privada, en un centro de atencin urgente o en una sala de emergencias.  Si tiene una emergencia mdica, por favor llame inmediatamente al 911 o vaya a la sala de  emergencias.  Nmeros de bper  - Dr. Kowalski: [phone]  - Dra. Moye: [phone]  - Dra. Stewart: [phone]  En caso de inclemencias del tiempo, por favor llame a nuestra lnea principal al [phone] para una actualizacin sobre el estado de cualquier retraso o cierre.  Consejos para la medicacin en dermatologa: Por favor, guarde las cajas en las que vienen los medicamentos de uso tpico para ayudarle a seguir las instrucciones sobre dnde y cmo usarlos. Las farmacias generalmente imprimen las instrucciones del medicamento slo en las cajas y no directamente en los tubos del medicamento.   Si su medicamento es muy caro, por favor, pngase en contacto con nuestra oficina llamando al [phone] y presione la opcin 4 o envenos un mensaje a travs de MyChart.   No podemos decirle cul ser su copago por los medicamentos por adelantado ya que esto es diferente dependiendo de la cobertura de su seguro. Sin embargo, es posible que podamos encontrar un medicamento sustituto a menor costo o llenar un formulario para que el seguro cubra el medicamento que se considera necesario.   Si se requiere una autorizacin previa para que su compaa de seguros cubra su medicamento, por favor permtanos de 1 a 2 das hbiles para completar este proceso.  Los precios de los medicamentos varan con frecuencia dependiendo del lugar de dnde se surte la receta y alguna farmacias pueden ofrecer precios ms baratos.  El sitio web www.goodrx.com tiene cupones para medicamentos de diferentes farmacias. Los precios aqu no tienen en cuenta lo que podra costar con la ayuda del seguro (puede ser ms barato con su seguro), pero el sitio web puede darle el precio si no utiliz ningn seguro.  - Puede imprimir el cupn correspondiente y llevarlo con su receta a la farmacia.  - Tambin puede pasar por nuestra oficina durante el horario de atencin regular y recoger una tarjeta de cupones de GoodRx.  - Si  necesita que su receta se enve electrnicamente a una farmacia diferente, informe a nuestra oficina a travs de MyChart de [facility] o por telfono llamando al [phone] y presione la opcin 4.  

## 2021-12-29 DIAGNOSIS — Z86007 Personal history of in-situ neoplasm of skin: Secondary | ICD-10-CM

## 2021-12-29 HISTORY — DX: Personal history of in-situ neoplasm of skin: Z86.007

## 2021-12-30 NOTE — Telephone Encounter (Signed)
LVM for pts daughter to call back.  

## 2022-01-03 ENCOUNTER — Telehealth: Payer: Self-pay

## 2022-01-03 NOTE — Telephone Encounter (Addendum)
  Discussed results with patient's daughter. She verbalized understanding. Patient scheduled for treatment 02/02/22.    ----- Message from Alfonso Patten, MD sent at 01/03/2022  1:19 PM EDT ----- 1. Skin , left superior shoulder SQUAMOUS CELL CARCINOMA IN SITU ARISING IN A SEBORRHEIC KERATOSIS --> ED&C  2. Skin , right upper arm WELL DIFFERENTIATED SQUAMOUS CELL CARCINOMA WITH SUPERFICIAL INFILTRATION --> ED&C   MAs please call with results and schedule in the next 4-6 weeks. Thank you!

## 2022-01-05 DIAGNOSIS — E1122 Type 2 diabetes mellitus with diabetic chronic kidney disease: Secondary | ICD-10-CM | POA: Diagnosis not present

## 2022-01-05 DIAGNOSIS — K5901 Slow transit constipation: Secondary | ICD-10-CM | POA: Diagnosis not present

## 2022-01-05 DIAGNOSIS — N1831 Chronic kidney disease, stage 3a: Secondary | ICD-10-CM | POA: Diagnosis not present

## 2022-01-05 DIAGNOSIS — I129 Hypertensive chronic kidney disease with stage 1 through stage 4 chronic kidney disease, or unspecified chronic kidney disease: Secondary | ICD-10-CM | POA: Diagnosis not present

## 2022-01-05 DIAGNOSIS — M19019 Primary osteoarthritis, unspecified shoulder: Secondary | ICD-10-CM | POA: Diagnosis not present

## 2022-01-08 ENCOUNTER — Encounter: Payer: Self-pay | Admitting: Dermatology

## 2022-01-09 DIAGNOSIS — I129 Hypertensive chronic kidney disease with stage 1 through stage 4 chronic kidney disease, or unspecified chronic kidney disease: Secondary | ICD-10-CM | POA: Diagnosis not present

## 2022-01-09 DIAGNOSIS — N189 Chronic kidney disease, unspecified: Secondary | ICD-10-CM | POA: Diagnosis not present

## 2022-01-09 DIAGNOSIS — C44311 Basal cell carcinoma of skin of nose: Secondary | ICD-10-CM | POA: Diagnosis not present

## 2022-01-09 DIAGNOSIS — E871 Hypo-osmolality and hyponatremia: Secondary | ICD-10-CM | POA: Diagnosis not present

## 2022-01-09 DIAGNOSIS — M1712 Unilateral primary osteoarthritis, left knee: Secondary | ICD-10-CM | POA: Diagnosis not present

## 2022-01-09 DIAGNOSIS — M1711 Unilateral primary osteoarthritis, right knee: Secondary | ICD-10-CM | POA: Diagnosis not present

## 2022-01-09 DIAGNOSIS — M19011 Primary osteoarthritis, right shoulder: Secondary | ICD-10-CM | POA: Diagnosis not present

## 2022-01-09 DIAGNOSIS — N39498 Other specified urinary incontinence: Secondary | ICD-10-CM | POA: Diagnosis not present

## 2022-01-09 DIAGNOSIS — M810 Age-related osteoporosis without current pathological fracture: Secondary | ICD-10-CM | POA: Diagnosis not present

## 2022-01-09 DIAGNOSIS — N3281 Overactive bladder: Secondary | ICD-10-CM | POA: Diagnosis not present

## 2022-01-09 DIAGNOSIS — R350 Frequency of micturition: Secondary | ICD-10-CM | POA: Diagnosis not present

## 2022-01-09 DIAGNOSIS — M19012 Primary osteoarthritis, left shoulder: Secondary | ICD-10-CM | POA: Diagnosis not present

## 2022-01-09 DIAGNOSIS — M1A30X0 Chronic gout due to renal impairment, unspecified site, without tophus (tophi): Secondary | ICD-10-CM | POA: Diagnosis not present

## 2022-01-09 DIAGNOSIS — E1122 Type 2 diabetes mellitus with diabetic chronic kidney disease: Secondary | ICD-10-CM | POA: Diagnosis not present

## 2022-01-09 DIAGNOSIS — Z9181 History of falling: Secondary | ICD-10-CM | POA: Diagnosis not present

## 2022-01-10 DIAGNOSIS — M19011 Primary osteoarthritis, right shoulder: Secondary | ICD-10-CM | POA: Diagnosis not present

## 2022-01-10 DIAGNOSIS — I129 Hypertensive chronic kidney disease with stage 1 through stage 4 chronic kidney disease, or unspecified chronic kidney disease: Secondary | ICD-10-CM | POA: Diagnosis not present

## 2022-01-10 DIAGNOSIS — E1122 Type 2 diabetes mellitus with diabetic chronic kidney disease: Secondary | ICD-10-CM | POA: Diagnosis not present

## 2022-01-10 DIAGNOSIS — M19012 Primary osteoarthritis, left shoulder: Secondary | ICD-10-CM | POA: Diagnosis not present

## 2022-01-10 DIAGNOSIS — M1711 Unilateral primary osteoarthritis, right knee: Secondary | ICD-10-CM | POA: Diagnosis not present

## 2022-01-10 DIAGNOSIS — M1712 Unilateral primary osteoarthritis, left knee: Secondary | ICD-10-CM | POA: Diagnosis not present

## 2022-01-16 DIAGNOSIS — M19012 Primary osteoarthritis, left shoulder: Secondary | ICD-10-CM | POA: Diagnosis not present

## 2022-01-16 DIAGNOSIS — M1712 Unilateral primary osteoarthritis, left knee: Secondary | ICD-10-CM | POA: Diagnosis not present

## 2022-01-16 DIAGNOSIS — E1122 Type 2 diabetes mellitus with diabetic chronic kidney disease: Secondary | ICD-10-CM | POA: Diagnosis not present

## 2022-01-16 DIAGNOSIS — M19011 Primary osteoarthritis, right shoulder: Secondary | ICD-10-CM | POA: Diagnosis not present

## 2022-01-16 DIAGNOSIS — I129 Hypertensive chronic kidney disease with stage 1 through stage 4 chronic kidney disease, or unspecified chronic kidney disease: Secondary | ICD-10-CM | POA: Diagnosis not present

## 2022-01-16 DIAGNOSIS — M1711 Unilateral primary osteoarthritis, right knee: Secondary | ICD-10-CM | POA: Diagnosis not present

## 2022-01-18 DIAGNOSIS — M1712 Unilateral primary osteoarthritis, left knee: Secondary | ICD-10-CM | POA: Diagnosis not present

## 2022-01-18 DIAGNOSIS — M1711 Unilateral primary osteoarthritis, right knee: Secondary | ICD-10-CM | POA: Diagnosis not present

## 2022-01-18 DIAGNOSIS — E1122 Type 2 diabetes mellitus with diabetic chronic kidney disease: Secondary | ICD-10-CM | POA: Diagnosis not present

## 2022-01-18 DIAGNOSIS — I129 Hypertensive chronic kidney disease with stage 1 through stage 4 chronic kidney disease, or unspecified chronic kidney disease: Secondary | ICD-10-CM | POA: Diagnosis not present

## 2022-01-18 DIAGNOSIS — M19011 Primary osteoarthritis, right shoulder: Secondary | ICD-10-CM | POA: Diagnosis not present

## 2022-01-18 DIAGNOSIS — M19012 Primary osteoarthritis, left shoulder: Secondary | ICD-10-CM | POA: Diagnosis not present

## 2022-01-20 DIAGNOSIS — I129 Hypertensive chronic kidney disease with stage 1 through stage 4 chronic kidney disease, or unspecified chronic kidney disease: Secondary | ICD-10-CM | POA: Diagnosis not present

## 2022-01-20 DIAGNOSIS — E1122 Type 2 diabetes mellitus with diabetic chronic kidney disease: Secondary | ICD-10-CM | POA: Diagnosis not present

## 2022-01-20 DIAGNOSIS — M19012 Primary osteoarthritis, left shoulder: Secondary | ICD-10-CM | POA: Diagnosis not present

## 2022-01-20 DIAGNOSIS — M19011 Primary osteoarthritis, right shoulder: Secondary | ICD-10-CM | POA: Diagnosis not present

## 2022-01-20 DIAGNOSIS — M1711 Unilateral primary osteoarthritis, right knee: Secondary | ICD-10-CM | POA: Diagnosis not present

## 2022-01-20 DIAGNOSIS — M1712 Unilateral primary osteoarthritis, left knee: Secondary | ICD-10-CM | POA: Diagnosis not present

## 2022-01-21 DIAGNOSIS — M19019 Primary osteoarthritis, unspecified shoulder: Secondary | ICD-10-CM | POA: Diagnosis not present

## 2022-01-21 DIAGNOSIS — M069 Rheumatoid arthritis, unspecified: Secondary | ICD-10-CM | POA: Diagnosis not present

## 2022-01-24 DIAGNOSIS — M19011 Primary osteoarthritis, right shoulder: Secondary | ICD-10-CM | POA: Diagnosis not present

## 2022-01-24 DIAGNOSIS — M19012 Primary osteoarthritis, left shoulder: Secondary | ICD-10-CM | POA: Diagnosis not present

## 2022-01-24 DIAGNOSIS — M1712 Unilateral primary osteoarthritis, left knee: Secondary | ICD-10-CM | POA: Diagnosis not present

## 2022-01-24 DIAGNOSIS — M1711 Unilateral primary osteoarthritis, right knee: Secondary | ICD-10-CM | POA: Diagnosis not present

## 2022-01-24 DIAGNOSIS — E1122 Type 2 diabetes mellitus with diabetic chronic kidney disease: Secondary | ICD-10-CM | POA: Diagnosis not present

## 2022-01-24 DIAGNOSIS — I129 Hypertensive chronic kidney disease with stage 1 through stage 4 chronic kidney disease, or unspecified chronic kidney disease: Secondary | ICD-10-CM | POA: Diagnosis not present

## 2022-01-26 DIAGNOSIS — E8881 Metabolic syndrome: Secondary | ICD-10-CM | POA: Diagnosis not present

## 2022-01-26 DIAGNOSIS — I129 Hypertensive chronic kidney disease with stage 1 through stage 4 chronic kidney disease, or unspecified chronic kidney disease: Secondary | ICD-10-CM | POA: Diagnosis not present

## 2022-01-26 DIAGNOSIS — M069 Rheumatoid arthritis, unspecified: Secondary | ICD-10-CM | POA: Diagnosis not present

## 2022-01-26 DIAGNOSIS — G4700 Insomnia, unspecified: Secondary | ICD-10-CM | POA: Diagnosis not present

## 2022-01-26 DIAGNOSIS — N1831 Chronic kidney disease, stage 3a: Secondary | ICD-10-CM | POA: Diagnosis not present

## 2022-01-26 DIAGNOSIS — K5904 Chronic idiopathic constipation: Secondary | ICD-10-CM | POA: Diagnosis not present

## 2022-01-26 DIAGNOSIS — J302 Other seasonal allergic rhinitis: Secondary | ICD-10-CM | POA: Diagnosis not present

## 2022-01-27 DIAGNOSIS — E1122 Type 2 diabetes mellitus with diabetic chronic kidney disease: Secondary | ICD-10-CM | POA: Diagnosis not present

## 2022-01-27 DIAGNOSIS — M1712 Unilateral primary osteoarthritis, left knee: Secondary | ICD-10-CM | POA: Diagnosis not present

## 2022-01-27 DIAGNOSIS — M1711 Unilateral primary osteoarthritis, right knee: Secondary | ICD-10-CM | POA: Diagnosis not present

## 2022-01-27 DIAGNOSIS — M19012 Primary osteoarthritis, left shoulder: Secondary | ICD-10-CM | POA: Diagnosis not present

## 2022-01-27 DIAGNOSIS — M19011 Primary osteoarthritis, right shoulder: Secondary | ICD-10-CM | POA: Diagnosis not present

## 2022-01-27 DIAGNOSIS — I129 Hypertensive chronic kidney disease with stage 1 through stage 4 chronic kidney disease, or unspecified chronic kidney disease: Secondary | ICD-10-CM | POA: Diagnosis not present

## 2022-01-30 DIAGNOSIS — M1712 Unilateral primary osteoarthritis, left knee: Secondary | ICD-10-CM | POA: Diagnosis not present

## 2022-01-30 DIAGNOSIS — N1831 Chronic kidney disease, stage 3a: Secondary | ICD-10-CM | POA: Diagnosis not present

## 2022-01-30 DIAGNOSIS — M1711 Unilateral primary osteoarthritis, right knee: Secondary | ICD-10-CM | POA: Diagnosis not present

## 2022-01-30 DIAGNOSIS — E1122 Type 2 diabetes mellitus with diabetic chronic kidney disease: Secondary | ICD-10-CM | POA: Diagnosis not present

## 2022-01-30 DIAGNOSIS — M19012 Primary osteoarthritis, left shoulder: Secondary | ICD-10-CM | POA: Diagnosis not present

## 2022-01-30 DIAGNOSIS — I129 Hypertensive chronic kidney disease with stage 1 through stage 4 chronic kidney disease, or unspecified chronic kidney disease: Secondary | ICD-10-CM | POA: Diagnosis not present

## 2022-01-30 DIAGNOSIS — K5904 Chronic idiopathic constipation: Secondary | ICD-10-CM | POA: Diagnosis not present

## 2022-01-30 DIAGNOSIS — M19011 Primary osteoarthritis, right shoulder: Secondary | ICD-10-CM | POA: Diagnosis not present

## 2022-01-30 DIAGNOSIS — M545 Low back pain, unspecified: Secondary | ICD-10-CM | POA: Diagnosis not present

## 2022-02-01 DIAGNOSIS — M19012 Primary osteoarthritis, left shoulder: Secondary | ICD-10-CM | POA: Diagnosis not present

## 2022-02-01 DIAGNOSIS — I129 Hypertensive chronic kidney disease with stage 1 through stage 4 chronic kidney disease, or unspecified chronic kidney disease: Secondary | ICD-10-CM | POA: Diagnosis not present

## 2022-02-01 DIAGNOSIS — M1712 Unilateral primary osteoarthritis, left knee: Secondary | ICD-10-CM | POA: Diagnosis not present

## 2022-02-01 DIAGNOSIS — E1122 Type 2 diabetes mellitus with diabetic chronic kidney disease: Secondary | ICD-10-CM | POA: Diagnosis not present

## 2022-02-01 DIAGNOSIS — M19011 Primary osteoarthritis, right shoulder: Secondary | ICD-10-CM | POA: Diagnosis not present

## 2022-02-01 DIAGNOSIS — M1711 Unilateral primary osteoarthritis, right knee: Secondary | ICD-10-CM | POA: Diagnosis not present

## 2022-02-02 ENCOUNTER — Ambulatory Visit (INDEPENDENT_AMBULATORY_CARE_PROVIDER_SITE_OTHER): Payer: Medicare Other | Admitting: Dermatology

## 2022-02-02 ENCOUNTER — Encounter: Payer: Self-pay | Admitting: Dermatology

## 2022-02-02 DIAGNOSIS — C44622 Squamous cell carcinoma of skin of right upper limb, including shoulder: Secondary | ICD-10-CM

## 2022-02-02 DIAGNOSIS — E039 Hypothyroidism, unspecified: Secondary | ICD-10-CM | POA: Diagnosis not present

## 2022-02-02 DIAGNOSIS — C4492 Squamous cell carcinoma of skin, unspecified: Secondary | ICD-10-CM

## 2022-02-02 DIAGNOSIS — D485 Neoplasm of uncertain behavior of skin: Secondary | ICD-10-CM

## 2022-02-02 DIAGNOSIS — D099 Carcinoma in situ, unspecified: Secondary | ICD-10-CM

## 2022-02-02 DIAGNOSIS — E119 Type 2 diabetes mellitus without complications: Secondary | ICD-10-CM | POA: Diagnosis not present

## 2022-02-02 DIAGNOSIS — D0462 Carcinoma in situ of skin of left upper limb, including shoulder: Secondary | ICD-10-CM

## 2022-02-02 DIAGNOSIS — D519 Vitamin B12 deficiency anemia, unspecified: Secondary | ICD-10-CM | POA: Diagnosis not present

## 2022-02-02 DIAGNOSIS — D229 Melanocytic nevi, unspecified: Secondary | ICD-10-CM

## 2022-02-02 DIAGNOSIS — E559 Vitamin D deficiency, unspecified: Secondary | ICD-10-CM | POA: Diagnosis not present

## 2022-02-02 NOTE — Patient Instructions (Addendum)
Wound Care Instructions  Cleanse wound gently with soap and water once a day then pat dry with clean gauze. Apply a thin coat of Petrolatum (petroleum jelly, "Vaseline") over the wound (unless you have an allergy to this). We recommend that you use a new, sterile tube of Vaseline. Do not pick or remove scabs. Do not remove the yellow or white "healing tissue" from the base of the wound.  Cover the wound with fresh, clean, nonstick gauze and secure with paper tape. You may use Band-Aids in place of gauze and tape if the wound is small enough, but would recommend trimming much of the tape off as there is often too much. Sometimes Band-Aids can irritate the skin.  You should call the office for your biopsy report after 1 week if you have not already been contacted.  If you experience any problems, such as abnormal amounts of bleeding, swelling, significant bruising, significant pain, or evidence of infection, please call the office immediately.  FOR ADULT SURGERY PATIENTS: If you need something for pain relief you may take 1 extra strength Tylenol (acetaminophen) AND 2 Ibuprofen (200mg each) together every 4 hours as needed for pain. (do not take these if you are allergic to them or if you have a reason you should not take them.) Typically, you may only need pain medication for 1 to 3 days.     Due to recent changes in healthcare laws, you may see results of your pathology and/or laboratory studies on MyChart before the doctors have had a chance to review them. We understand that in some cases there may be results that are confusing or concerning to you. Please understand that not all results are received at the same time and often the doctors may need to interpret multiple results in order to provide you with the best plan of care or course of treatment. Therefore, we ask that you please give us 2 business days to thoroughly review all your results before contacting the office for clarification. Should  we see a critical lab result, you will be contacted sooner.   If You Need Anything After Your Visit  If you have any questions or concerns for your doctor, please call our main line at [phone] and press option 4 to reach your doctor's medical assistant. If no one answers, please leave a voicemail as directed and we will return your call as soon as possible. Messages left after 4 pm will be answered the following business day.   You may also send us a message via MyChart. We typically respond to MyChart messages within 1-2 business days.  For prescription refills, please ask your pharmacy to contact our office. Our fax number is [phone].  If you have an urgent issue when the clinic is closed that cannot wait until the next business day, you can page your doctor at the number below.    Please note that while we do our best to be available for urgent issues outside of office hours, we are not available 24/7.   If you have an urgent issue and are unable to reach us, you may choose to seek medical care at your doctor's office, retail clinic, urgent care center, or emergency room.  If you have a medical emergency, please immediately call 911 or go to the emergency department.  Pager Numbers  - Dr. Kowalski: [phone]  - Dr. Moye: [phone]  - Dr. Stewart: [phone]  In the event of inclement weather, please call our main line at   [phone] for an update on the status of any delays or closures.  Dermatology Medication Tips: Please keep the boxes that topical medications come in in order to help keep track of the instructions about where and how to use these. Pharmacies typically print the medication instructions only on the boxes and not directly on the medication tubes.   If your medication is too expensive, please contact our office at [phone] option 4 or send us a message through MyChart.   We are unable to tell what your co-pay for medications will be in  advance as this is different depending on your insurance coverage. However, we may be able to find a substitute medication at lower cost or fill out paperwork to get insurance to cover a needed medication.   If a prior authorization is required to get your medication covered by your insurance company, please allow us 1-2 business days to complete this process.  Drug prices often vary depending on where the prescription is filled and some pharmacies may offer cheaper prices.  The website www.goodrx.com contains coupons for medications through different pharmacies. The prices here do not account for what the cost may be with help from insurance (it may be cheaper with your insurance), but the website can give you the price if you did not use any insurance.  - You can print the associated coupon and take it with your prescription to the pharmacy.  - You may also stop by our office during regular business hours and pick up a GoodRx coupon card.  - If you need your prescription sent electronically to a different pharmacy, notify our office through [facility] MyChart or by phone at [phone] option 4.     Si Usted Necesita Algo Despus de Su Visita  Tambin puede enviarnos un mensaje a travs de MyChart. Por lo general respondemos a los mensajes de MyChart en el transcurso de 1 a 2 das hbiles.  Para renovar recetas, por favor pida a su farmacia que se ponga en contacto con nuestra oficina. Nuestro nmero de fax es el [phone].  Si tiene un asunto urgente cuando la clnica est cerrada y que no puede esperar hasta el siguiente da hbil, puede llamar/localizar a su doctor(a) al nmero que aparece a continuacin.   Por favor, tenga en cuenta que aunque hacemos todo lo posible para estar disponibles para asuntos urgentes fuera del horario de oficina, no estamos disponibles las 24 horas del da, los 7 das de la semana.   Si tiene un problema urgente y no puede comunicarse con nosotros, puede  optar por buscar atencin mdica  en el consultorio de su doctor(a), en una clnica privada, en un centro de atencin urgente o en una sala de emergencias.  Si tiene una emergencia mdica, por favor llame inmediatamente al 911 o vaya a la sala de emergencias.  Nmeros de bper  - Dr. Kowalski: [phone]  - Dra. Moye: [phone]  - Dra. Stewart: [phone]  En caso de inclemencias del tiempo, por favor llame a nuestra lnea principal al [phone] para una actualizacin sobre el estado de cualquier retraso o cierre.  Consejos para la medicacin en dermatologa: Por favor, guarde las cajas en las que vienen los medicamentos de uso tpico para ayudarle a seguir las instrucciones sobre dnde y cmo usarlos. Las farmacias generalmente imprimen las instrucciones del medicamento slo en las cajas y no directamente en los tubos del medicamento.   Si su medicamento es muy caro, por favor, pngase en contacto con   nuestra oficina llamando al [phone] y presione la opcin 4 o envenos un mensaje a travs de MyChart.   No podemos decirle cul ser su copago por los medicamentos por adelantado ya que esto es diferente dependiendo de la cobertura de su seguro. Sin embargo, es posible que podamos encontrar un medicamento sustituto a menor costo o llenar un formulario para que el seguro cubra el medicamento que se considera necesario.   Si se requiere una autorizacin previa para que su compaa de seguros cubra su medicamento, por favor permtanos de 1 a 2 das hbiles para completar este proceso.  Los precios de los medicamentos varan con frecuencia dependiendo del lugar de dnde se surte la receta y alguna farmacias pueden ofrecer precios ms baratos.  El sitio web www.goodrx.com tiene cupones para medicamentos de diferentes farmacias. Los precios aqu no tienen en cuenta lo que podra costar con la ayuda del seguro (puede ser ms barato con su seguro), pero el sitio web puede darle el  precio si no utiliz ningn seguro.  - Puede imprimir el cupn correspondiente y llevarlo con su receta a la farmacia.  - Tambin puede pasar por nuestra oficina durante el horario de atencin regular y recoger una tarjeta de cupones de GoodRx.  - Si necesita que su receta se enve electrnicamente a una farmacia diferente, informe a nuestra oficina a travs de MyChart de [employer] o por telfono llamando al [phone] y presione la opcin 4.  

## 2022-02-02 NOTE — Progress Notes (Signed)
   Follow-Up Visit   Subjective  [name] is a 88 y.o. female who presents for the following: Procedure (Patient here today for EDC x 2.).  Patient accompanied by daughter who contributes to history.  The following portions of the chart were reviewed this encounter and updated as appropriate:   Tobacco  Allergies  Meds  Problems  Med Hx  Surg Hx  Fam Hx      Review of Systems:  No other skin or systemic complaints except as noted in HPI or Assessment and Plan.  Objective  Well appearing patient in no apparent distress; mood and affect are within normal limits.  A focused examination was performed including arm, back, shoulder. Relevant physical exam findings are noted in the Assessment and Plan.  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  Destruction of lesion  Destruction method: electrodesiccation and curettage   Informed consent: discussed and consent obtained   Timeout:  patient name, date of birth, surgical site, and procedure verified Anesthesia: the lesion was anesthetized in a standard fashion   Anesthetic:  1% lidocaine w/ epinephrine 1-100,000 buffered w/ 8.4% NaHCO3 Curettage performed in three different directions: Yes   Electrodesiccation performed over the curetted area: Yes   Curettage cycles:  3 Final wound size (cm):  0.9 Hemostasis achieved with:  electrodesiccation Outcome: patient tolerated procedure well with no complications   Post-procedure details: sterile dressing applied and wound care instructions given   Dressing type: petrolatum    Squamous cell carcinoma of skin Right Upper Arm  Destruction of lesion  Destruction method: electrodesiccation and curettage   Informed consent: discussed and consent obtained   Timeout:  patient name, date of birth, surgical  site, and procedure verified Anesthesia: the lesion was anesthetized in a standard fashion   Anesthetic:  1% lidocaine w/ epinephrine 1-100,000 buffered w/ 8.4% NaHCO3 Curettage performed in three different directions: Yes   Electrodesiccation performed over the curetted area: Yes   Curettage cycles:  3 Final wound size (cm):  1.4 Hemostasis achieved with:  electrodesiccation Outcome: patient tolerated procedure well with no complications   Post-procedure details: sterile dressing applied and wound care instructions given   Dressing type: petrolatum    Neoplasm of uncertain behavior of skin right cheek  Favor seborrheic keratosis over lentigo, monitor for changes and recheck at follow-up. Call for changes. Consider biopsy if any changes.   Return for AK follow up, as scheduled.  Graciella Belton, RMA, am acting as scribe for Forest Gleason, MD .  Documentation: I have reviewed the above documentation for accuracy and completeness, and I agree with the above.  Forest Gleason, MD

## 2022-02-03 ENCOUNTER — Encounter: Payer: Self-pay | Admitting: Dermatology

## 2022-02-06 DIAGNOSIS — I129 Hypertensive chronic kidney disease with stage 1 through stage 4 chronic kidney disease, or unspecified chronic kidney disease: Secondary | ICD-10-CM | POA: Diagnosis not present

## 2022-02-06 DIAGNOSIS — M19012 Primary osteoarthritis, left shoulder: Secondary | ICD-10-CM | POA: Diagnosis not present

## 2022-02-06 DIAGNOSIS — M19011 Primary osteoarthritis, right shoulder: Secondary | ICD-10-CM | POA: Diagnosis not present

## 2022-02-06 DIAGNOSIS — M1712 Unilateral primary osteoarthritis, left knee: Secondary | ICD-10-CM | POA: Diagnosis not present

## 2022-02-06 DIAGNOSIS — E1122 Type 2 diabetes mellitus with diabetic chronic kidney disease: Secondary | ICD-10-CM | POA: Diagnosis not present

## 2022-02-06 DIAGNOSIS — M1711 Unilateral primary osteoarthritis, right knee: Secondary | ICD-10-CM | POA: Diagnosis not present

## 2022-02-07 DIAGNOSIS — E1122 Type 2 diabetes mellitus with diabetic chronic kidney disease: Secondary | ICD-10-CM | POA: Diagnosis not present

## 2022-02-07 DIAGNOSIS — M1711 Unilateral primary osteoarthritis, right knee: Secondary | ICD-10-CM | POA: Diagnosis not present

## 2022-02-07 DIAGNOSIS — M19011 Primary osteoarthritis, right shoulder: Secondary | ICD-10-CM | POA: Diagnosis not present

## 2022-02-07 DIAGNOSIS — M1712 Unilateral primary osteoarthritis, left knee: Secondary | ICD-10-CM | POA: Diagnosis not present

## 2022-02-07 DIAGNOSIS — M19012 Primary osteoarthritis, left shoulder: Secondary | ICD-10-CM | POA: Diagnosis not present

## 2022-02-07 DIAGNOSIS — I129 Hypertensive chronic kidney disease with stage 1 through stage 4 chronic kidney disease, or unspecified chronic kidney disease: Secondary | ICD-10-CM | POA: Diagnosis not present

## 2022-02-08 DIAGNOSIS — M1711 Unilateral primary osteoarthritis, right knee: Secondary | ICD-10-CM | POA: Diagnosis not present

## 2022-02-08 DIAGNOSIS — M810 Age-related osteoporosis without current pathological fracture: Secondary | ICD-10-CM | POA: Diagnosis not present

## 2022-02-08 DIAGNOSIS — M1712 Unilateral primary osteoarthritis, left knee: Secondary | ICD-10-CM | POA: Diagnosis not present

## 2022-02-08 DIAGNOSIS — M1A30X0 Chronic gout due to renal impairment, unspecified site, without tophus (tophi): Secondary | ICD-10-CM | POA: Diagnosis not present

## 2022-02-08 DIAGNOSIS — M19011 Primary osteoarthritis, right shoulder: Secondary | ICD-10-CM | POA: Diagnosis not present

## 2022-02-08 DIAGNOSIS — N39498 Other specified urinary incontinence: Secondary | ICD-10-CM | POA: Diagnosis not present

## 2022-02-08 DIAGNOSIS — M19012 Primary osteoarthritis, left shoulder: Secondary | ICD-10-CM | POA: Diagnosis not present

## 2022-02-08 DIAGNOSIS — N3281 Overactive bladder: Secondary | ICD-10-CM | POA: Diagnosis not present

## 2022-02-08 DIAGNOSIS — E1122 Type 2 diabetes mellitus with diabetic chronic kidney disease: Secondary | ICD-10-CM | POA: Diagnosis not present

## 2022-02-08 DIAGNOSIS — R350 Frequency of micturition: Secondary | ICD-10-CM | POA: Diagnosis not present

## 2022-02-08 DIAGNOSIS — N189 Chronic kidney disease, unspecified: Secondary | ICD-10-CM | POA: Diagnosis not present

## 2022-02-08 DIAGNOSIS — E871 Hypo-osmolality and hyponatremia: Secondary | ICD-10-CM | POA: Diagnosis not present

## 2022-02-08 DIAGNOSIS — I129 Hypertensive chronic kidney disease with stage 1 through stage 4 chronic kidney disease, or unspecified chronic kidney disease: Secondary | ICD-10-CM | POA: Diagnosis not present

## 2022-02-08 DIAGNOSIS — Z9181 History of falling: Secondary | ICD-10-CM | POA: Diagnosis not present

## 2022-02-08 DIAGNOSIS — C44311 Basal cell carcinoma of skin of nose: Secondary | ICD-10-CM | POA: Diagnosis not present

## 2022-02-14 DIAGNOSIS — E1122 Type 2 diabetes mellitus with diabetic chronic kidney disease: Secondary | ICD-10-CM | POA: Diagnosis not present

## 2022-02-14 DIAGNOSIS — M1711 Unilateral primary osteoarthritis, right knee: Secondary | ICD-10-CM | POA: Diagnosis not present

## 2022-02-14 DIAGNOSIS — I129 Hypertensive chronic kidney disease with stage 1 through stage 4 chronic kidney disease, or unspecified chronic kidney disease: Secondary | ICD-10-CM | POA: Diagnosis not present

## 2022-02-14 DIAGNOSIS — M1712 Unilateral primary osteoarthritis, left knee: Secondary | ICD-10-CM | POA: Diagnosis not present

## 2022-02-14 DIAGNOSIS — M19011 Primary osteoarthritis, right shoulder: Secondary | ICD-10-CM | POA: Diagnosis not present

## 2022-02-14 DIAGNOSIS — M19012 Primary osteoarthritis, left shoulder: Secondary | ICD-10-CM | POA: Diagnosis not present

## 2022-02-15 ENCOUNTER — Ambulatory Visit (INDEPENDENT_AMBULATORY_CARE_PROVIDER_SITE_OTHER): Payer: Medicare Other | Admitting: Dermatology

## 2022-02-15 DIAGNOSIS — L570 Actinic keratosis: Secondary | ICD-10-CM

## 2022-02-15 DIAGNOSIS — D492 Neoplasm of unspecified behavior of bone, soft tissue, and skin: Secondary | ICD-10-CM

## 2022-02-15 NOTE — Patient Instructions (Addendum)
Cryotherapy Aftercare  Wash gently with soap and water everyday.   Apply Vaseline daily until healed.    Due to recent changes in healthcare laws, you may see results of your pathology and/or laboratory studies on MyChart before the doctors have had a chance to review them. We understand that in some cases there may be results that are confusing or concerning to you. Please understand that not all results are received at the same time and often the doctors may need to interpret multiple results in order to provide you with the best plan of care or course of treatment. Therefore, we ask that you please give us 2 business days to thoroughly review all your results before contacting the office for clarification. Should we see a critical lab result, you will be contacted sooner.   If You Need Anything After Your Visit  If you have any questions or concerns for your doctor, please call our main line at [phone] and press option 4 to reach your doctor's medical assistant. If no one answers, please leave a voicemail as directed and we will return your call as soon as possible. Messages left after 4 pm will be answered the following business day.   You may also send us a message via MyChart. We typically respond to MyChart messages within 1-2 business days.  For prescription refills, please ask your pharmacy to contact our office. Our fax number is [phone].  If you have an urgent issue when the clinic is closed that cannot wait until the next business day, you can page your doctor at the number below.    Please note that while we do our best to be available for urgent issues outside of office hours, we are not available 24/7.   If you have an urgent issue and are unable to reach us, you may choose to seek medical care at your doctor's office, retail clinic, urgent care center, or emergency room.  If you have a medical emergency, please immediately call 911 or go to the emergency  department.  Pager Numbers  - Dr. Kowalski: [phone]  - Dr. Moye: [phone]  - Dr. Stewart: [phone]  In the event of inclement weather, please call our main line at [phone] for an update on the status of any delays or closures.  Dermatology Medication Tips: Please keep the boxes that topical medications come in in order to help keep track of the instructions about where and how to use these. Pharmacies typically print the medication instructions only on the boxes and not directly on the medication tubes.   If your medication is too expensive, please contact our office at [phone] option 4 or send us a message through MyChart.   We are unable to tell what your co-pay for medications will be in advance as this is different depending on your insurance coverage. However, we may be able to find a substitute medication at lower cost or fill out paperwork to get insurance to cover a needed medication.   If a prior authorization is required to get your medication covered by your insurance company, please allow us 1-2 business days to complete this process.  Drug prices often vary depending on where the prescription is filled and some pharmacies may offer cheaper prices.  The website www.goodrx.com contains coupons for medications through different pharmacies. The prices here do not account for what the cost may be with help from insurance (it may be cheaper with your insurance), but the website can give you the   price if you did not use any insurance.  - You can print the associated coupon and take it with your prescription to the pharmacy.  - You may also stop by our office during regular business hours and pick up a GoodRx coupon card.  - If you need your prescription sent electronically to a different pharmacy, notify our office through [facility] MyChart or by phone at [phone] option 4.     Si Usted Necesita Algo Despus de Su Visita  Tambin puede enviarnos un  mensaje a travs de MyChart. Por lo general respondemos a los mensajes de MyChart en el transcurso de 1 a 2 das hbiles.  Para renovar recetas, por favor pida a su farmacia que se ponga en contacto con nuestra oficina. Nuestro nmero de fax es el [phone].  Si tiene un asunto urgente cuando la clnica est cerrada y que no puede esperar hasta el siguiente da hbil, puede llamar/localizar a su doctor(a) al nmero que aparece a continuacin.   Por favor, tenga en cuenta que aunque hacemos todo lo posible para estar disponibles para asuntos urgentes fuera del horario de oficina, no estamos disponibles las 24 horas del da, los 7 das de la semana.   Si tiene un problema urgente y no puede comunicarse con nosotros, puede optar por buscar atencin mdica  en el consultorio de su doctor(a), en una clnica privada, en un centro de atencin urgente o en una sala de emergencias.  Si tiene una emergencia mdica, por favor llame inmediatamente al 911 o vaya a la sala de emergencias.  Nmeros de bper  - Dr. Kowalski: [phone]  - Dra. Moye: [phone]  - Dra. Stewart: [phone]  En caso de inclemencias del tiempo, por favor llame a nuestra lnea principal al [phone] para una actualizacin sobre el estado de cualquier retraso o cierre.  Consejos para la medicacin en dermatologa: Por favor, guarde las cajas en las que vienen los medicamentos de uso tpico para ayudarle a seguir las instrucciones sobre dnde y cmo usarlos. Las farmacias generalmente imprimen las instrucciones del medicamento slo en las cajas y no directamente en los tubos del medicamento.   Si su medicamento es muy caro, por favor, pngase en contacto con nuestra oficina llamando al [phone] y presione la opcin 4 o envenos un mensaje a travs de MyChart.   No podemos decirle cul ser su copago por los medicamentos por adelantado ya que esto es diferente dependiendo de la cobertura de su seguro. Sin embargo,  es posible que podamos encontrar un medicamento sustituto a menor costo o llenar un formulario para que el seguro cubra el medicamento que se considera necesario.   Si se requiere una autorizacin previa para que su compaa de seguros cubra su medicamento, por favor permtanos de 1 a 2 das hbiles para completar este proceso.  Los precios de los medicamentos varan con frecuencia dependiendo del lugar de dnde se surte la receta y alguna farmacias pueden ofrecer precios ms baratos.  El sitio web www.goodrx.com tiene cupones para medicamentos de diferentes farmacias. Los precios aqu no tienen en cuenta lo que podra costar con la ayuda del seguro (puede ser ms barato con su seguro), pero el sitio web puede darle el precio si no utiliz ningn seguro.  - Puede imprimir el cupn correspondiente y llevarlo con su receta a la farmacia.  - Tambin puede pasar por nuestra oficina durante el horario de atencin regular y recoger una tarjeta de cupones de GoodRx.  - Si necesita que   su receta se enve electrnicamente a una farmacia diferente, informe a nuestra oficina a travs de MyChart de [place] o por telfono llamando al [phone] y presione la opcin 4.  

## 2022-02-15 NOTE — Progress Notes (Signed)
   Follow-Up Visit   Subjective  [name] is a 88 y.o. female who presents for the following: Actinic Keratosis (Patient here today to treat AK's at face. ).  Patient accompanied by daughter who contributes to history.   The following portions of the chart were reviewed this encounter and updated as appropriate:   Tobacco  Allergies  Meds  Problems  Med Hx  Surg Hx  Fam Hx      Review of Systems:  No other skin or systemic complaints except as noted in HPI or Assessment and Plan.  Objective  Well appearing patient in no apparent distress; mood and affect are within normal limits.  A focused examination was performed including face, arms, shoulders. Relevant physical exam findings are noted in the Assessment and Plan.  right cheek 0.5 cm thin brown papule  L nasal supratip, L nasal sidewall, L lateral forehead, L mandible (4) Erythematous thin papules/macules with thick scale    Assessment & Plan  Neoplasm of skin right cheek  Favor seborrheic keratosis over lentigo, monitor for changes and recheck at follow-up. Call for changes. Consider biopsy if any changes.  Photo compared, stable  AK (actinic keratosis) (4) L nasal supratip, L nasal sidewall, L lateral forehead, L mandible  Hypertorphic  Actinic keratoses are precancerous spots that appear secondary to cumulative UV radiation exposure/sun exposure over time. They are chronic with expected duration over 1 year. A portion of actinic keratoses will progress to squamous cell carcinoma of the skin. It is not possible to reliably predict which spots will progress to skin cancer and so treatment is recommended to prevent development of skin cancer.  Recommend daily broad spectrum sunscreen SPF 30+ to sun-exposed areas, reapply every 2 hours as needed.  Recommend staying in the shade or wearing long sleeves, sun glasses (UVA+UVB protection) and wide brim hats (4-inch brim around the entire circumference of the  hat). Call for new or changing lesions.  Prior to procedure, discussed risks of blister formation, small wound, skin dyspigmentation, or rare scar following cryotherapy. Recommend Vaseline ointment to treated areas while healing.  Consider biopsy if not resolved   Destruction of lesion - L nasal supratip, L nasal sidewall, L lateral forehead, L mandible  Destruction method: cryotherapy   Informed consent: discussed and consent obtained   Lesion destroyed using liquid nitrogen: Yes   Cryotherapy cycles:  2 Outcome: patient tolerated procedure well with no complications   Post-procedure details: wound care instructions given     F/u in 1 month  Graciella Belton, RMA, am acting as scribe for Forest Gleason, MD .  Documentation: I have reviewed the above documentation for accuracy and completeness, and I agree with the above.  Forest Gleason, MD

## 2022-02-20 ENCOUNTER — Encounter: Payer: Self-pay | Admitting: Dermatology

## 2022-02-22 ENCOUNTER — Ambulatory Visit: Payer: Medicare Other | Admitting: Podiatry

## 2022-02-23 DIAGNOSIS — N390 Urinary tract infection, site not specified: Secondary | ICD-10-CM | POA: Diagnosis not present

## 2022-02-23 DIAGNOSIS — E039 Hypothyroidism, unspecified: Secondary | ICD-10-CM | POA: Diagnosis not present

## 2022-02-23 DIAGNOSIS — I10 Essential (primary) hypertension: Secondary | ICD-10-CM | POA: Diagnosis not present

## 2022-02-23 DIAGNOSIS — D519 Vitamin B12 deficiency anemia, unspecified: Secondary | ICD-10-CM | POA: Diagnosis not present

## 2022-02-24 ENCOUNTER — Emergency Department (HOSPITAL_BASED_OUTPATIENT_CLINIC_OR_DEPARTMENT_OTHER): Payer: Medicare Other | Admitting: Radiology

## 2022-02-24 ENCOUNTER — Emergency Department (HOSPITAL_BASED_OUTPATIENT_CLINIC_OR_DEPARTMENT_OTHER)
Admission: EM | Admit: 2022-02-24 | Discharge: 2022-02-24 | Disposition: A | Discharge status: Home / Self Care | Payer: Medicare Other | Attending: Emergency Medicine | Admitting: Emergency Medicine

## 2022-02-24 ENCOUNTER — Ambulatory Visit: Payer: Medicare Other | Admitting: Podiatry

## 2022-02-24 ENCOUNTER — Other Ambulatory Visit: Payer: Self-pay

## 2022-02-24 ENCOUNTER — Emergency Department (HOSPITAL_BASED_OUTPATIENT_CLINIC_OR_DEPARTMENT_OTHER): Payer: Medicare Other

## 2022-02-24 ENCOUNTER — Encounter (HOSPITAL_BASED_OUTPATIENT_CLINIC_OR_DEPARTMENT_OTHER): Payer: Self-pay

## 2022-02-24 DIAGNOSIS — I700 Atherosclerosis of aorta: Secondary | ICD-10-CM | POA: Diagnosis not present

## 2022-02-24 DIAGNOSIS — R4182 Altered mental status, unspecified: Secondary | ICD-10-CM | POA: Insufficient documentation

## 2022-02-24 DIAGNOSIS — I10 Essential (primary) hypertension: Secondary | ICD-10-CM | POA: Diagnosis not present

## 2022-02-24 DIAGNOSIS — R059 Cough, unspecified: Secondary | ICD-10-CM | POA: Diagnosis not present

## 2022-02-24 DIAGNOSIS — Z20822 Contact with and (suspected) exposure to covid-19: Secondary | ICD-10-CM | POA: Diagnosis not present

## 2022-02-24 DIAGNOSIS — E119 Type 2 diabetes mellitus without complications: Secondary | ICD-10-CM | POA: Insufficient documentation

## 2022-02-24 DIAGNOSIS — R5383 Other fatigue: Secondary | ICD-10-CM | POA: Diagnosis not present

## 2022-02-24 DIAGNOSIS — I6782 Cerebral ischemia: Secondary | ICD-10-CM | POA: Diagnosis not present

## 2022-02-24 DIAGNOSIS — Z1152 Encounter for screening for COVID-19: Secondary | ICD-10-CM | POA: Insufficient documentation

## 2022-02-24 DIAGNOSIS — Z79899 Other long term (current) drug therapy: Secondary | ICD-10-CM | POA: Insufficient documentation

## 2022-02-24 DIAGNOSIS — Z7982 Long term (current) use of aspirin: Secondary | ICD-10-CM | POA: Insufficient documentation

## 2022-02-24 LAB — URINALYSIS, ROUTINE W REFLEX MICROSCOPIC
Bilirubin Urine: NEGATIVE
Glucose, UA: NEGATIVE mg/dL
Ketones, ur: NEGATIVE mg/dL
Leukocytes,Ua: NEGATIVE
Nitrite: NEGATIVE
Protein, ur: NEGATIVE mg/dL
Specific Gravity, Urine: 1.008 (ref 1.005–1.030)
pH: 5 (ref 5.0–8.0)

## 2022-02-24 LAB — BASIC METABOLIC PANEL
Anion gap: 10 (ref 5–15)
BUN: 20 mg/dL (ref 8–23)
CO2: 23 mmol/L (ref 22–32)
Calcium: 9.5 mg/dL (ref 8.9–10.3)
Chloride: 95 mmol/L — ABNORMAL LOW (ref 98–111)
Creatinine, Ser: 1.1 mg/dL — ABNORMAL HIGH (ref 0.44–1.00)
GFR, Estimated: 46 mL/min — ABNORMAL LOW (ref >60)
Glucose, Bld: 91 mg/dL (ref 70–99)
Potassium: 4.3 mmol/L (ref 3.5–5.1)
Sodium: 128 mmol/L — ABNORMAL LOW (ref 135–145)

## 2022-02-24 LAB — CBC
HCT: 39.6 % (ref 36.0–46.0)
Hemoglobin: 13 g/dL (ref 12.0–15.0)
MCH: 31.1 pg (ref 26.0–34.0)
MCHC: 32.8 g/dL (ref 30.0–36.0)
MCV: 94.7 fL (ref 80.0–100.0)
Platelets: 307 10*3/uL (ref 150–400)
RBC: 4.18 MIL/uL (ref 3.87–5.11)
RDW: 13.2 % (ref 11.5–15.5)
WBC: 12.1 10*3/uL — ABNORMAL HIGH (ref 4.0–10.5)
nRBC: 0 % (ref 0.0–0.2)

## 2022-02-24 LAB — RESP PANEL BY RT-PCR (FLU A&B, COVID) ARPGX2
Influenza A by PCR: NEGATIVE
Influenza B by PCR: NEGATIVE
SARS Coronavirus 2 by RT PCR: NEGATIVE

## 2022-02-24 LAB — CBG MONITORING, ED: Glucose-Capillary: 91 mg/dL (ref 70–99)

## 2022-02-24 MED ORDER — SODIUM CHLORIDE 0.9 % IV BOLUS
500.0000 mL | Freq: Once | INTRAVENOUS | Status: AC
  Administered 2022-02-24: 500 mL via INTRAVENOUS

## 2022-02-24 NOTE — ED Provider Notes (Signed)
[facility] Provider Note   CSN: [id number] Arrival date & time: 02/24/22  1342     History  Chief Complaint  Patient presents with   Fatigue    [name] is a 88 y.o. female.  Patient is a 88 year old female with history of diabetes, hypertension, hyperlipidemia who presents with generalized weakness.  She resides at [facility].  She is here with daughter who helps to provide history.  She reports that increased fatigue and generalized weakness over the last  several days.  Her daughter states that generally she has some weakness when she is not drinking very well which is common for her.  However over the last week its been worse than normal.  She falls asleep easily.  The patient reports that she has had some congestion she denies any chest pain or shortness of breath.  No nausea vomiting or diarrhea.  No known fevers.  No urinary symptoms no recent falls.  She ambulates with a walker and the daughter states that she has had a gradual decline in her leg to ambulate but that has not really changed over the last few days.  Her daughter states that 2 days ago she had an episode where she woke up in the evening and called the daughter and was talking about that someone had died and the patient thought that she had killed them.  She also thought it was in the morning and at night.  She had just woken up and it seemed that she may have had a dream.  Over the course of the conversation, the patient realized that it was night and that she had not killed somebody.  She has had no further hallucinations.       Home Medications Prior to Admission medications   Medication Sig Start Date End Date Taking? Authorizing Provider  Accu-Chek Softclix Lancets lancets CHECK BLOOD SUGAR 1 TIME A DAY. DX: E11.9 03/22/20   Bedsole, Amy E, MD  aspirin EC 81 MG tablet Take 81 mg by mouth daily. Swallow whole.    [provider]  azelastine  (ASTELIN) 0.1 % nasal spray Place 1 spray into both nostrils 2 (two) times daily. Use in each nostril as directed 12/02/21   Crain, Whitney L, PA  Blood Glucose Monitoring Suppl (ACCU-CHEK GUIDE) w/Device KIT 1 each by Does not apply route daily. Use to check blood sugar daily as needed.  Dx: E11.9 03/22/20   Bedsole, Amy E, MD  fluticasone (FLONASE) 50 MCG/ACT nasal spray PLACE 2 SPRAYS INTO BOTH NOSTRILS DAILY AS NEEDED FOR ALLERGIES. 06/10/20   Bedsole, Amy E, MD  glucose blood (ACCU-CHEK GUIDE) test strip Use to check blood sugar 1 time daily 03/22/20   Bedsole, Amy E, MD  Loratadine 10 MG CAPS Take 1 capsule by mouth daily.    [provider]  Multiple Vitamin (MULTIVITAMIN PO) Take 1 tablet by mouth daily.    [provider]  MYRBETRIQ 25 MG TB24 tablet Take 25 mg by mouth daily. 05/05/21   [provider]  Propylene Glycol (SYSTANE BALANCE) 0.6 % SOLN Place 1-2 drops into both eyes daily.    [provider]  ramipril (ALTACE) 5 MG capsule TAKE 1 CAPSULE BY MOUTH EVERY DAY 12/03/20   Bedsole, Amy E, MD  SALINE NASAL SPRAY NA Place 2 sprays into the nose every 6 (six) hours as needed (nasal stuffiness).    [provider]      Allergies  Codeine, Other, and Statins    Review of Systems   Review of Systems  Constitutional:  Positive for fatigue. Negative for chills, diaphoresis and fever.  HENT:  Positive for congestion. Negative for rhinorrhea and sneezing.   Eyes: Negative.   Respiratory:  Positive for cough. Negative for chest tightness and shortness of breath.   Cardiovascular:  Negative for chest pain and leg swelling.  Gastrointestinal:  Negative for abdominal pain, blood in stool, diarrhea, nausea and vomiting.  Genitourinary:  Negative for difficulty urinating, flank pain, frequency and hematuria.  Musculoskeletal:  Negative for arthralgias and back pain.  Skin:  Negative for rash.  Neurological:  Positive for weakness (Generalized).  Negative for dizziness, speech difficulty, numbness and headaches.    Physical Exam Updated Vital Signs BP (!) 153/81   Pulse 69   Temp 97.8 F (36.6 C) (Oral)   Resp 17   Ht _0  (1.6 m)   Wt 59.5 kg   SpO2 99%   BMI 23.24 kg/m  Physical Exam Constitutional:      Appearance: She is well-developed.  HENT:     Head: Normocephalic and atraumatic.  Eyes:     Pupils: Pupils are equal, round, and reactive to light.  Cardiovascular:     Rate and Rhythm: Normal rate and regular rhythm.     Heart sounds: Normal heart sounds.  Pulmonary:     Effort: Pulmonary effort is normal. No respiratory distress.     Breath sounds: Normal breath sounds. No wheezing or rales.  Chest:     Chest wall: No tenderness.  Abdominal:     General: Bowel sounds are normal.     Palpations: Abdomen is soft.     Tenderness: There is no abdominal tenderness. There is no guarding or rebound.  Musculoskeletal:        General: Normal range of motion.     Cervical back: Normal range of motion and neck supple.  Lymphadenopathy:     Cervical: No cervical adenopathy.  Skin:    General: Skin is warm and dry.     Findings: No rash.  Neurological:     General: No focal deficit present.     Mental Status: She is alert and oriented to person, place, and time.     ED Results / Procedures / Treatments   Labs (all labs ordered are listed, but only abnormal results are displayed) Labs Reviewed  BASIC METABOLIC PANEL - Abnormal; Notable for the following components:      Result Value   Sodium 128 (*)    Chloride 95 (*)    Creatinine, Ser 1.10 (*)    GFR, Estimated 46 (*)    All other components within normal limits  CBC - Abnormal; Notable for the following components:   WBC 12.1 (*)    All other components within normal limits  URINALYSIS, ROUTINE W REFLEX MICROSCOPIC - Abnormal; Notable for the following components:   Hgb urine dipstick TRACE (*)    Bacteria, UA RARE (*)    All other components within  normal limits  RESP PANEL BY RT-PCR (FLU A&B, COVID) ARPGX2  CBG MONITORING, ED    EKG EKG Interpretation  Date/Time:  Friday February 24 2022 14:02:31 EDT Ventricular Rate:  85 PR Interval:  148 QRS Duration: 88 QT Interval:  374 QTC Calculation: 445 R Axis:   -56 Text Interpretation: Sinus rhythm with Premature supraventricular complexes Left axis deviation Left ventricular hypertrophy with repolarization abnormality ( R in aVL , [employer]  product , Romhilt-Estes ) Inferior infarct (cited on or before 02-Dec-2021) Anterior infarct (cited on or before 02-Dec-2021) Abnormal ECG When compared with ECG of 02-Dec-2021 16:10, Premature supraventricular complexes are now Present Questionable change in initial forces of Septal leads similar to prior EKG Confirmed by Malvin Johns [phone]) on 02/24/2022 4:09:00 PM  Radiology CT Head Wo Contrast  Result Date: 02/24/2022 CLINICAL DATA:  Mental status change EXAM: CT HEAD WITHOUT CONTRAST TECHNIQUE: Contiguous axial images were obtained from the base of the skull through the vertex without intravenous contrast. RADIATION DOSE REDUCTION: This exam was performed according to the departmental dose-optimization program which includes automated exposure control, adjustment of the mA and/or kV according to patient size and/or use of iterative reconstruction technique. COMPARISON:  CT brain 11/01/2021, 07/08/2019 FINDINGS: Brain: No acute territorial infarction, hemorrhage, or intracranial mass. Chronic ovoid calcification in the right pons. Atrophy. Mild chronic small vessel ischemic changes of the white matter. Stable ventricular size. Vascular: No hyperdense vessels. Vertebral and carotid vascular calcification Skull: Normal. Negative for fracture or focal lesion. Sinuses/Orbits: No acute finding. Other: None IMPRESSION: 1. No CT evidence for acute intracranial abnormality. 2. Atrophy and chronic small vessel ischemic changes of the white matter. Electronically  Signed   By: Donavan Foil M.D.   On: 02/24/2022 18:17   DG Chest 2 View  Result Date: 02/24/2022 CLINICAL DATA:  Cough. EXAM: CHEST - 2 VIEW COMPARISON:  November 01, 2021. FINDINGS: The heart size and mediastinal contours are within normal limits. Both lungs are clear. Severe degenerative changes are seen involving both glenohumeral joints. IMPRESSION: No active cardiopulmonary disease. Aortic Atherosclerosis (ICD10-I70.0). Electronically Signed   By: Marijo Conception M.D.   On: 02/24/2022 16:32    Procedures Procedures    Medications Ordered in ED Medications  sodium chloride 0.9 % bolus 500 mL (0 mLs Intravenous Stopped 02/24/22 1703)    ED Course/ Medical Decision Making/ A&P                           Medical Decision Making Amount and/or Complexity of Data Reviewed Labs: ordered. Radiology: ordered.   PT presents with generalized weakness.  No focal neuro deficits/concerns for stroke.  No CP or concerns for ACS.  Vital signs are stable.  Labs are nonconcerning. Her sodium was slightly low but on chart review, just slightly lower than prior values.  CXR shows no evidence of pneumonia.  No signs of CHF.  No evidence of UTI.  Head CT negative.  Pt was given IVFs and seems to be feeling better.  Daughter says pt is back to baseline alertness.  No indication for hospitilization at this time.  Will d/c back to ALF.  Daughter will ask PA at center to see pt on Monday for a recheck.  Return precautions were given.  Final Clinical Impression(s) / ED Diagnoses Final diagnoses:  Other fatigue    Rx / DC Orders ED Discharge Orders     None         Malvin Johns, MD 02/25/22 0001

## 2022-02-24 NOTE — Discharge Instructions (Signed)
Follow-up with the PA at the assisted living facility within the next few days for recheck.  Return to emergency room if you have any worsening symptoms.

## 2022-02-24 NOTE — ED Triage Notes (Addendum)
Patient here POV from Home with Family from [place].  Endorses Fatigue for approximately 1 Week. More than Normal per Family.    Family also endorses Hallucination-Like Incident on Wednesday. No Known Fevers. No N/V/D.   NAD Noted during Triage. A&Ox4. GCS 15. BIB Personal Wheelchair.

## 2022-02-25 DIAGNOSIS — M19011 Primary osteoarthritis, right shoulder: Secondary | ICD-10-CM | POA: Diagnosis not present

## 2022-02-25 DIAGNOSIS — E1122 Type 2 diabetes mellitus with diabetic chronic kidney disease: Secondary | ICD-10-CM | POA: Diagnosis not present

## 2022-02-25 DIAGNOSIS — M1712 Unilateral primary osteoarthritis, left knee: Secondary | ICD-10-CM | POA: Diagnosis not present

## 2022-02-25 DIAGNOSIS — M19012 Primary osteoarthritis, left shoulder: Secondary | ICD-10-CM | POA: Diagnosis not present

## 2022-02-25 DIAGNOSIS — I129 Hypertensive chronic kidney disease with stage 1 through stage 4 chronic kidney disease, or unspecified chronic kidney disease: Secondary | ICD-10-CM | POA: Diagnosis not present

## 2022-02-25 DIAGNOSIS — M1711 Unilateral primary osteoarthritis, right knee: Secondary | ICD-10-CM | POA: Diagnosis not present

## 2022-02-27 DIAGNOSIS — E1122 Type 2 diabetes mellitus with diabetic chronic kidney disease: Secondary | ICD-10-CM | POA: Diagnosis not present

## 2022-02-27 DIAGNOSIS — I129 Hypertensive chronic kidney disease with stage 1 through stage 4 chronic kidney disease, or unspecified chronic kidney disease: Secondary | ICD-10-CM | POA: Diagnosis not present

## 2022-02-27 DIAGNOSIS — K5904 Chronic idiopathic constipation: Secondary | ICD-10-CM | POA: Diagnosis not present

## 2022-02-27 DIAGNOSIS — N1831 Chronic kidney disease, stage 3a: Secondary | ICD-10-CM | POA: Diagnosis not present

## 2022-03-01 DIAGNOSIS — M1711 Unilateral primary osteoarthritis, right knee: Secondary | ICD-10-CM | POA: Diagnosis not present

## 2022-03-01 DIAGNOSIS — M19011 Primary osteoarthritis, right shoulder: Secondary | ICD-10-CM | POA: Diagnosis not present

## 2022-03-01 DIAGNOSIS — M1712 Unilateral primary osteoarthritis, left knee: Secondary | ICD-10-CM | POA: Diagnosis not present

## 2022-03-01 DIAGNOSIS — Z23 Encounter for immunization: Secondary | ICD-10-CM | POA: Diagnosis not present

## 2022-03-01 DIAGNOSIS — E1122 Type 2 diabetes mellitus with diabetic chronic kidney disease: Secondary | ICD-10-CM | POA: Diagnosis not present

## 2022-03-01 DIAGNOSIS — M19012 Primary osteoarthritis, left shoulder: Secondary | ICD-10-CM | POA: Diagnosis not present

## 2022-03-01 DIAGNOSIS — I129 Hypertensive chronic kidney disease with stage 1 through stage 4 chronic kidney disease, or unspecified chronic kidney disease: Secondary | ICD-10-CM | POA: Diagnosis not present

## 2022-03-06 DIAGNOSIS — M19011 Primary osteoarthritis, right shoulder: Secondary | ICD-10-CM | POA: Diagnosis not present

## 2022-03-06 DIAGNOSIS — M1712 Unilateral primary osteoarthritis, left knee: Secondary | ICD-10-CM | POA: Diagnosis not present

## 2022-03-07 DIAGNOSIS — M19011 Primary osteoarthritis, right shoulder: Secondary | ICD-10-CM | POA: Diagnosis not present

## 2022-03-07 DIAGNOSIS — E1122 Type 2 diabetes mellitus with diabetic chronic kidney disease: Secondary | ICD-10-CM | POA: Diagnosis not present

## 2022-03-07 DIAGNOSIS — M19012 Primary osteoarthritis, left shoulder: Secondary | ICD-10-CM | POA: Diagnosis not present

## 2022-03-07 DIAGNOSIS — M1711 Unilateral primary osteoarthritis, right knee: Secondary | ICD-10-CM | POA: Diagnosis not present

## 2022-03-07 DIAGNOSIS — M1712 Unilateral primary osteoarthritis, left knee: Secondary | ICD-10-CM | POA: Diagnosis not present

## 2022-03-07 DIAGNOSIS — I129 Hypertensive chronic kidney disease with stage 1 through stage 4 chronic kidney disease, or unspecified chronic kidney disease: Secondary | ICD-10-CM | POA: Diagnosis not present

## 2022-03-23 ENCOUNTER — Ambulatory Visit (INDEPENDENT_AMBULATORY_CARE_PROVIDER_SITE_OTHER): Payer: Medicare Other | Admitting: Dermatology

## 2022-03-23 ENCOUNTER — Encounter: Payer: Self-pay | Admitting: Dermatology

## 2022-03-23 DIAGNOSIS — L578 Other skin changes due to chronic exposure to nonionizing radiation: Secondary | ICD-10-CM

## 2022-03-23 DIAGNOSIS — L905 Scar conditions and fibrosis of skin: Secondary | ICD-10-CM | POA: Diagnosis not present

## 2022-03-23 DIAGNOSIS — D485 Neoplasm of uncertain behavior of skin: Secondary | ICD-10-CM

## 2022-03-23 DIAGNOSIS — D492 Neoplasm of unspecified behavior of bone, soft tissue, and skin: Secondary | ICD-10-CM

## 2022-03-23 DIAGNOSIS — L570 Actinic keratosis: Secondary | ICD-10-CM | POA: Diagnosis not present

## 2022-03-23 NOTE — Patient Instructions (Addendum)
Wound Care Instructions  Cleanse wound gently with soap and water once a day then pat dry with clean gauze. Apply a thin coat of Petrolatum (petroleum jelly, "Vaseline") over the wound (unless you have an allergy to this). We recommend that you use a new, sterile tube of Vaseline. Do not pick or remove scabs. Do not remove the yellow or white "healing tissue" from the base of the wound.  Cover the wound with fresh, clean, nonstick gauze and secure with paper tape. You may use Band-Aids in place of gauze and tape if the wound is small enough, but would recommend trimming much of the tape off as there is often too much. Sometimes Band-Aids can irritate the skin.  You should call the office for your biopsy report after 1 week if you have not already been contacted.  If you experience any problems, such as abnormal amounts of bleeding, swelling, significant bruising, significant pain, or evidence of infection, please call the office immediately.  FOR ADULT SURGERY PATIENTS: If you need something for pain relief you may take 1 extra strength Tylenol (acetaminophen) AND 2 Ibuprofen (200mg each) together every 4 hours as needed for pain. (do not take these if you are allergic to them or if you have a reason you should not take them.) Typically, you may only need pain medication for 1 to 3 days.     Cryotherapy Aftercare  Wash gently with soap and water everyday.   Apply Vaseline and Band-Aid daily until healed.    Due to recent changes in healthcare laws, you may see results of your pathology and/or laboratory studies on MyChart before the doctors have had a chance to review them. We understand that in some cases there may be results that are confusing or concerning to you. Please understand that not all results are received at the same time and often the doctors may need to interpret multiple results in order to provide you with the best plan of care or course of treatment. Therefore, we ask that you  please give us 2 business days to thoroughly review all your results before contacting the office for clarification. Should we see a critical lab result, you will be contacted sooner.   If You Need Anything After Your Visit  If you have any questions or concerns for your doctor, please call our main line at [phone] and press option 4 to reach your doctor's medical assistant. If no one answers, please leave a voicemail as directed and we will return your call as soon as possible. Messages left after 4 pm will be answered the following business day.   You may also send us a message via MyChart. We typically respond to MyChart messages within 1-2 business days.  For prescription refills, please ask your pharmacy to contact our office. Our fax number is [phone].  If you have an urgent issue when the clinic is closed that cannot wait until the next business day, you can page your doctor at the number below.    Please note that while we do our best to be available for urgent issues outside of office hours, we are not available 24/7.   If you have an urgent issue and are unable to reach us, you may choose to seek medical care at your doctor's office, retail clinic, urgent care center, or emergency room.  If you have a medical emergency, please immediately call 911 or go to the emergency department.  Pager Numbers  - Dr. Kowalski: [phone]  -   Dr. Moye: [phone]  - Dr. Stewart: [phone]  In the event of inclement weather, please call our main line at [phone] for an update on the status of any delays or closures.  Dermatology Medication Tips: Please keep the boxes that topical medications come in in order to help keep track of the instructions about where and how to use these. Pharmacies typically print the medication instructions only on the boxes and not directly on the medication tubes.   If your medication is too expensive, please contact our office at  [phone] option 4 or send us a message through MyChart.   We are unable to tell what your co-pay for medications will be in advance as this is different depending on your insurance coverage. However, we may be able to find a substitute medication at lower cost or fill out paperwork to get insurance to cover a needed medication.   If a prior authorization is required to get your medication covered by your insurance company, please allow us 1-2 business days to complete this process.  Drug prices often vary depending on where the prescription is filled and some pharmacies may offer cheaper prices.  The website www.goodrx.com contains coupons for medications through different pharmacies. The prices here do not account for what the cost may be with help from insurance (it may be cheaper with your insurance), but the website can give you the price if you did not use any insurance.  - You can print the associated coupon and take it with your prescription to the pharmacy.  - You may also stop by our office during regular business hours and pick up a GoodRx coupon card.  - If you need your prescription sent electronically to a different pharmacy, notify our office through [facility] MyChart or by phone at [phone] option 4.     Si Usted Necesita Algo Despus de Su Visita  Tambin puede enviarnos un mensaje a travs de MyChart. Por lo general respondemos a los mensajes de MyChart en el transcurso de 1 a 2 das hbiles.  Para renovar recetas, por favor pida a su farmacia que se ponga en contacto con nuestra oficina. Nuestro nmero de fax es el [phone].  Si tiene un asunto urgente cuando la clnica est cerrada y que no puede esperar hasta el siguiente da hbil, puede llamar/localizar a su doctor(a) al nmero que aparece a continuacin.   Por favor, tenga en cuenta que aunque hacemos todo lo posible para estar disponibles para asuntos urgentes fuera del horario de oficina, no estamos  disponibles las 24 horas del da, los 7 das de la semana.   Si tiene un problema urgente y no puede comunicarse con nosotros, puede optar por buscar atencin mdica  en el consultorio de su doctor(a), en una clnica privada, en un centro de atencin urgente o en una sala de emergencias.  Si tiene una emergencia mdica, por favor llame inmediatamente al 911 o vaya a la sala de emergencias.  Nmeros de bper  - Dr. Kowalski: [phone]  - Dra. Moye: [phone]  - Dra. Stewart: [phone]  En caso de inclemencias del tiempo, por favor llame a nuestra lnea principal al [phone] para una actualizacin sobre el estado de cualquier retraso o cierre.  Consejos para la medicacin en dermatologa: Por favor, guarde las cajas en las que vienen los medicamentos de uso tpico para ayudarle a seguir las instrucciones sobre dnde y cmo usarlos. Las farmacias generalmente imprimen las instrucciones del medicamento slo en las cajas y   no directamente en los tubos del medicamento.   Si su medicamento es muy caro, por favor, pngase en contacto con nuestra oficina llamando al [phone] y presione la opcin 4 o envenos un mensaje a travs de MyChart.   No podemos decirle cul ser su copago por los medicamentos por adelantado ya que esto es diferente dependiendo de la cobertura de su seguro. Sin embargo, es posible que podamos encontrar un medicamento sustituto a menor costo o llenar un formulario para que el seguro cubra el medicamento que se considera necesario.   Si se requiere una autorizacin previa para que su compaa de seguros cubra su medicamento, por favor permtanos de 1 a 2 das hbiles para completar este proceso.  Los precios de los medicamentos varan con frecuencia dependiendo del lugar de dnde se surte la receta y alguna farmacias pueden ofrecer precios ms baratos.  El sitio web www.goodrx.com tiene cupones para medicamentos de diferentes farmacias. Los precios aqu no  tienen en cuenta lo que podra costar con la ayuda del seguro (puede ser ms barato con su seguro), pero el sitio web puede darle el precio si no utiliz ningn seguro.  - Puede imprimir el cupn correspondiente y llevarlo con su receta a la farmacia.  - Tambin puede pasar por nuestra oficina durante el horario de atencin regular y recoger una tarjeta de cupones de GoodRx.  - Si necesita que su receta se enve electrnicamente a una farmacia diferente, informe a nuestra oficina a travs de MyChart de  o por telfono llamando al [phone] y presione la opcin 4.  

## 2022-03-23 NOTE — Progress Notes (Signed)
Follow-Up Visit   Subjective  [name] is a 88 y.o. female who presents for the following: Actinic Keratosis (1 month recheck. Nose, left forehead, left mandible. Tx with LN2).  The patient has spots, moles and lesions to be evaluated, some may be new or changing and the patient has concerns that these could be cancer.  Patient accompanied by daughter who contributes to history.  The following portions of the chart were reviewed this encounter and updated as appropriate:  Tobacco  Allergies  Meds  Problems  Med Hx  Surg Hx  Fam Hx      Review of Systems: No other skin or systemic complaints except as noted in HPI or Assessment and Plan.   Objective  Well appearing patient in no apparent distress; mood and affect are within normal limits.  A focused examination was performed including head, including the scalp, face, neck, nose, ears, eyelids, and lips. Relevant physical exam findings are noted in the Assessment and Plan.  Right Upper Arm 1.5cm erythematous ulcerated plaque       right mandible x1, right neck x3, right cheek x3, left cheek x1, left neck x1, left forehead x2 (11) Erythematous thin papules/macules with gritty scale.    Assessment & Plan   Actinic Damage - chronic, secondary to cumulative UV radiation exposure/sun exposure over time - diffuse scaly erythematous macules with underlying dyspigmentation - Recommend daily broad spectrum sunscreen SPF 30+ to sun-exposed areas, reapply every 2 hours as needed.  - Recommend staying in the shade or wearing long sleeves, sun glasses (UVA+UVB protection) and wide brim hats (4-inch brim around the entire circumference of the hat). - Call for new or changing lesions.  Neoplasm of skin Right Upper Arm  Skin / nail biopsy Type of biopsy: tangential   Informed consent: discussed and consent obtained   Anesthesia: the lesion was anesthetized in a standard fashion   Anesthesia comment:  Area prepped with  alcohol Anesthetic:  1% lidocaine w/ epinephrine 1-100,000 buffered w/ 8.4% NaHCO3 Instrument used: flexible razor blade   Hemostasis achieved with: pressure, aluminum chloride and electrodesiccation   Outcome: patient tolerated procedure well   Post-procedure details: wound care instructions given   Post-procedure details comment:  Ointment and small bandage applied  Specimen 1 - Surgical pathology Differential Diagnosis: Scar, R/O SCC  Check Margins: No  AK (actinic keratosis) (11) right mandible x1, right neck x3, right cheek x3, left cheek x1, left neck x1, left forehead x2  Actinic keratoses are precancerous spots that appear secondary to cumulative UV radiation exposure/sun exposure over time. They are chronic with expected duration over 1 year. A portion of actinic keratoses will progress to squamous cell carcinoma of the skin. It is not possible to reliably predict which spots will progress to skin cancer and so treatment is recommended to prevent development of skin cancer.  Recommend daily broad spectrum sunscreen SPF 30+ to sun-exposed areas, reapply every 2 hours as needed.  Recommend staying in the shade or wearing long sleeves, sun glasses (UVA+UVB protection) and wide brim hats (4-inch brim around the entire circumference of the hat). Call for new or changing lesions.  Destruction of lesion - right mandible x1, right neck x3, right cheek x3, left cheek x1, left neck x1, left forehead x2  Destruction method: cryotherapy   Informed consent: discussed and consent obtained   Lesion destroyed using liquid nitrogen: Yes   Region frozen until ice ball extended beyond lesion: Yes   Outcome: patient tolerated  procedure well with no complications   Post-procedure details: wound care instructions given   Additional details:  Prior to procedure, discussed risks of blister formation, small wound, skin dyspigmentation, or rare scar following cryotherapy. Recommend Vaseline ointment to  treated areas while healing.    Return in about 3 months (around 06/22/2022) for AK Follow Up.  I, Emelia Salisbury, CMA, am acting as scribe for Forest Gleason, MD.  Documentation: I have reviewed the above documentation for accuracy and completeness, and I agree with the above.  Forest Gleason, MD

## 2022-04-03 ENCOUNTER — Telehealth: Payer: Self-pay

## 2022-04-03 NOTE — Telephone Encounter (Signed)
-----   Message from [place], MD sent at 04/03/2022 11:32 AM EST ----- Skin , right upper arm DERMAL SCAR, NO MALIGANCY IS IDENTIFIED --> scar, no sign of skin cancer, no treatment needed  MAs please call. Thank you!

## 2022-04-11 DIAGNOSIS — N390 Urinary tract infection, site not specified: Secondary | ICD-10-CM | POA: Diagnosis not present

## 2022-04-20 DIAGNOSIS — E1122 Type 2 diabetes mellitus with diabetic chronic kidney disease: Secondary | ICD-10-CM | POA: Diagnosis not present

## 2022-04-20 DIAGNOSIS — G4700 Insomnia, unspecified: Secondary | ICD-10-CM | POA: Diagnosis not present

## 2022-04-20 DIAGNOSIS — J302 Other seasonal allergic rhinitis: Secondary | ICD-10-CM | POA: Diagnosis not present

## 2022-04-20 DIAGNOSIS — I129 Hypertensive chronic kidney disease with stage 1 through stage 4 chronic kidney disease, or unspecified chronic kidney disease: Secondary | ICD-10-CM | POA: Diagnosis not present

## 2022-04-20 DIAGNOSIS — N1831 Chronic kidney disease, stage 3a: Secondary | ICD-10-CM | POA: Diagnosis not present

## 2022-04-20 DIAGNOSIS — K5903 Drug induced constipation: Secondary | ICD-10-CM | POA: Diagnosis not present

## 2022-04-20 DIAGNOSIS — M059 Rheumatoid arthritis with rheumatoid factor, unspecified: Secondary | ICD-10-CM | POA: Diagnosis not present

## 2022-06-15 ENCOUNTER — Ambulatory Visit: Payer: Medicare Other | Admitting: Dermatology

## 2022-06-16 ENCOUNTER — Emergency Department (HOSPITAL_COMMUNITY): Payer: Medicare Other

## 2022-06-16 ENCOUNTER — Emergency Department (HOSPITAL_COMMUNITY)
Admission: EM | Admit: 2022-06-16 | Discharge: 2022-06-17 | Disposition: A | Discharge status: Skilled Nursing Facility | Payer: Medicare Other | Attending: Emergency Medicine | Admitting: Emergency Medicine

## 2022-06-16 ENCOUNTER — Encounter (HOSPITAL_COMMUNITY): Payer: Self-pay

## 2022-06-16 ENCOUNTER — Other Ambulatory Visit: Payer: Self-pay

## 2022-06-16 DIAGNOSIS — Z7982 Long term (current) use of aspirin: Secondary | ICD-10-CM | POA: Insufficient documentation

## 2022-06-16 DIAGNOSIS — I10 Essential (primary) hypertension: Secondary | ICD-10-CM | POA: Diagnosis not present

## 2022-06-16 DIAGNOSIS — W06XXXA Fall from bed, initial encounter: Secondary | ICD-10-CM | POA: Insufficient documentation

## 2022-06-16 DIAGNOSIS — S60512A Abrasion of left hand, initial encounter: Secondary | ICD-10-CM | POA: Diagnosis not present

## 2022-06-16 DIAGNOSIS — S0083XA Contusion of other part of head, initial encounter: Secondary | ICD-10-CM | POA: Insufficient documentation

## 2022-06-16 DIAGNOSIS — S0990XA Unspecified injury of head, initial encounter: Secondary | ICD-10-CM | POA: Diagnosis present

## 2022-06-16 DIAGNOSIS — T148XXA Other injury of unspecified body region, initial encounter: Secondary | ICD-10-CM

## 2022-06-16 MED ORDER — LACTATED RINGERS IV BOLUS
1000.0000 mL | Freq: Once | INTRAVENOUS | Status: AC
  Administered 2022-06-17: 1000 mL via INTRAVENOUS

## 2022-06-16 NOTE — ED Provider Notes (Signed)
[facility] Provider Note   CSN: [id number] Arrival date & time: 06/16/22  2256     History {Add pertinent medical, surgical, social history, OB history to HPI:1} Chief Complaint  Patient presents with   [name] is a 89 y.o. female.  89 year old female without significant past medical history who presents to the ER today after a fall.  She does not remember the entirety of the events but thinks she might of fallen out of bed and hit her head on a dresser.  No known syncope.  She is hurting in her face and her head and her anterior neck from the c-collar.  She denies taking blood thinners.  She denies pain elsewhere.  States that she does fall sometimes but not recently.  History is limited secondary to cognitive decline.   Fall       Home Medications Prior to Admission medications   Medication Sig Start Date End Date Taking? Authorizing Provider  Accu-Chek Softclix Lancets lancets CHECK BLOOD SUGAR 1 TIME A DAY. DX: E11.9 03/22/20   Bedsole, Amy E, MD  aspirin EC 81 MG tablet Take 81 mg by mouth daily. Swallow whole.    [provider]  azelastine (ASTELIN) 0.1 % nasal spray Place 1 spray into both nostrils 2 (two) times daily. Use in each nostril as directed 12/02/21   Crain, Whitney L, PA  Blood Glucose Monitoring Suppl (ACCU-CHEK GUIDE) w/Device KIT 1 each by Does not apply route daily. Use to check blood sugar daily as needed.  Dx: E11.9 03/22/20   Bedsole, Amy E, MD  fluticasone (FLONASE) 50 MCG/ACT nasal spray PLACE 2 SPRAYS INTO BOTH NOSTRILS DAILY AS NEEDED FOR ALLERGIES. 06/10/20   Bedsole, Amy E, MD  glucose blood (ACCU-CHEK GUIDE) test strip Use to check blood sugar 1 time daily 03/22/20   Bedsole, Amy E, MD  Loratadine 10 MG CAPS Take 1 capsule by mouth daily.    [provider]  Multiple Vitamin (MULTIVITAMIN PO) Take 1 tablet by mouth daily.    [provider]  MYRBETRIQ 25 MG  TB24 tablet Take 25 mg by mouth daily. 05/05/21   [provider]  Propylene Glycol (SYSTANE BALANCE) 0.6 % SOLN Place 1-2 drops into both eyes daily.    [provider]  ramipril (ALTACE) 5 MG capsule TAKE 1 CAPSULE BY MOUTH EVERY DAY 12/03/20   Bedsole, Amy E, MD  SALINE NASAL SPRAY NA Place 2 sprays into the nose every 6 (six) hours as needed (nasal stuffiness).    [provider]      Allergies    Codeine, Other, and Statins    Review of Systems   Review of Systems  Physical Exam Updated Vital Signs BP (!) 191/88 (BP Location: Right Arm)   Pulse 72   Temp (!) 97.5 F (36.4 C) (Oral)   Resp 18   Ht '5\' 3"'$  (1.6 m)   Wt 59.4 kg   SpO2 97%   BMI 23.21 kg/m  Physical Exam Vitals and nursing note reviewed.  Constitutional:      Appearance: She is well-developed.  HENT:     Head: Normocephalic and atraumatic.     Comments: Large hematoma with overlying abrasion to her right upper forehead, old ecchymosis around her left eye.  C-collar in place removed per patient request without any cervical tenderness or deformity. Eyes:     Pupils: Pupils are equal, round, and reactive to light.  Cardiovascular:     Rate and Rhythm: Normal rate and regular rhythm.  Pulmonary:     Effort: No respiratory distress.     Breath sounds: No stridor.  Abdominal:     General: There is no distension.  Musculoskeletal:     Cervical back: Normal range of motion.  Skin:    General: Skin is warm and dry.     Comments: Abrasion/small skin tear over dorsum of left hand, diffuse small ecchymotic areas on her legs along with evidence of chronic venous stasis.  Neurological:     General: No focal deficit present.     Mental Status: She is alert.     ED Results / Procedures / Treatments   Labs (all labs ordered are listed, but only abnormal results are displayed) Labs Reviewed - No data to display  EKG None  Radiology No results found.  Procedures Procedures   {Document cardiac monitor, telemetry assessment procedure when appropriate:1}  Medications Ordered in ED Medications - No data to display  ED Course/ Medical Decision Making/ A&P   {   Click here for ABCD2, HEART and other calculatorsREFRESH Note before signing :1}                          Medical Decision Making  ***  {Document critical care time when appropriate:1} {Document review of labs and clinical decision tools ie heart score, Chads2Vasc2 etc:1}  {Document your independent review of radiology images, and any outside records:1} {Document your discussion with family members, caretakers, and with consultants:1} {Document social determinants of health affecting pt's care:1} {Document your decision making why or why not admission, treatments were needed:1} Final Clinical Impression(s) / ED Diagnoses Final diagnoses:  None    Rx / DC Orders ED Discharge Orders     None

## 2022-06-16 NOTE — ED Triage Notes (Addendum)
PER EMS: pt is from [place] with c/o fall when she got up out of her bed to use the restroom. She hit her head on a desk. There is a laceration to the top of her head. No blood thinners. Denies LOC. The patient remembers the event.   Pt is at baseline mentation. She is very hard of hearing. Pt arrives in c-collar and reports pain the back of her neck.  BP- 120/90, HR-90, O2-99%, CBG-147

## 2022-06-17 DIAGNOSIS — S0083XA Contusion of other part of head, initial encounter: Secondary | ICD-10-CM | POA: Diagnosis not present

## 2022-06-17 LAB — COMPREHENSIVE METABOLIC PANEL
ALT: 19 U/L (ref 0–44)
AST: 20 U/L (ref 15–41)
Albumin: 3.7 g/dL (ref 3.5–5.0)
Alkaline Phosphatase: 66 U/L (ref 38–126)
Anion gap: 11 (ref 5–15)
BUN: 32 mg/dL — ABNORMAL HIGH (ref 8–23)
CO2: 24 mmol/L (ref 22–32)
Calcium: 9.5 mg/dL (ref 8.9–10.3)
Chloride: 98 mmol/L (ref 98–111)
Creatinine, Ser: 1.26 mg/dL — ABNORMAL HIGH (ref 0.44–1.00)
GFR, Estimated: 39 mL/min — ABNORMAL LOW (ref >60)
Glucose, Bld: 137 mg/dL — ABNORMAL HIGH (ref 70–99)
Potassium: 4.4 mmol/L (ref 3.5–5.1)
Sodium: 133 mmol/L — ABNORMAL LOW (ref 135–145)
Total Bilirubin: 0.5 mg/dL (ref 0.3–1.2)
Total Protein: 7.4 g/dL (ref 6.5–8.1)

## 2022-06-17 LAB — URINALYSIS, ROUTINE W REFLEX MICROSCOPIC
Bilirubin Urine: NEGATIVE
Glucose, UA: NEGATIVE mg/dL
Hgb urine dipstick: NEGATIVE
Ketones, ur: NEGATIVE mg/dL
Nitrite: NEGATIVE
Protein, ur: NEGATIVE mg/dL
Specific Gravity, Urine: 1.015 (ref 1.005–1.030)
pH: 5 (ref 5.0–8.0)

## 2022-06-17 LAB — CBC WITH DIFFERENTIAL/PLATELET
Abs Immature Granulocytes: 0.09 10*3/uL — ABNORMAL HIGH (ref 0.00–0.07)
Basophils Absolute: 0 10*3/uL (ref 0.0–0.1)
Basophils Relative: 0 %
Eosinophils Absolute: 0.2 10*3/uL (ref 0.0–0.5)
Eosinophils Relative: 1 %
HCT: 42.1 % (ref 36.0–46.0)
Hemoglobin: 13.6 g/dL (ref 12.0–15.0)
Immature Granulocytes: 1 %
Lymphocytes Relative: 10 %
Lymphs Abs: 1.5 10*3/uL (ref 0.7–4.0)
MCH: 31.8 pg (ref 26.0–34.0)
MCHC: 32.3 g/dL (ref 30.0–36.0)
MCV: 98.4 fL (ref 80.0–100.0)
Monocytes Absolute: 1.1 10*3/uL — ABNORMAL HIGH (ref 0.1–1.0)
Monocytes Relative: 7 %
Neutro Abs: 12 10*3/uL — ABNORMAL HIGH (ref 1.7–7.7)
Neutrophils Relative %: 81 %
Platelets: 308 10*3/uL (ref 150–400)
RBC: 4.28 MIL/uL (ref 3.87–5.11)
RDW: 13.2 % (ref 11.5–15.5)
WBC: 14.9 10*3/uL — ABNORMAL HIGH (ref 4.0–10.5)
nRBC: 0 % (ref 0.0–0.2)

## 2022-06-17 NOTE — ED Notes (Signed)
PTAR Called 

## 2022-06-22 ENCOUNTER — Telehealth: Payer: Self-pay

## 2022-06-22 NOTE — Telephone Encounter (Signed)
     Patient  visit on 06/17/2022  at [facility]. [facility] was for fall. Patient is at [facility].  Have you been able to follow up with your primary care physician?  The patient was or was not able to obtain any needed medicine or equipment.  Are there diet recommendations that you are having difficulty following?  Patient expresses understanding of discharge instructions and education provided has no other needs at this time.    [place] Resource Care Guide   [email or web address]  ?? [phone]   Website: [email or web address]

## 2022-06-27 ENCOUNTER — Ambulatory Visit: Payer: Medicare Other | Admitting: Dermatology

## 2022-06-28 ENCOUNTER — Encounter (HOSPITAL_COMMUNITY): Payer: Self-pay | Admitting: Internal Medicine

## 2022-06-28 ENCOUNTER — Inpatient Hospital Stay (HOSPITAL_COMMUNITY)
Admission: EM | Admit: 2022-06-28 | Discharge: 2022-07-03 | DRG: 300 | Disposition: A | Discharge status: Skilled Nursing Facility | Payer: Medicare Other | Source: Skilled Nursing Facility | Attending: Internal Medicine | Admitting: Internal Medicine

## 2022-06-28 ENCOUNTER — Emergency Department (HOSPITAL_COMMUNITY): Payer: Medicare Other

## 2022-06-28 ENCOUNTER — Other Ambulatory Visit: Payer: Self-pay

## 2022-06-28 ENCOUNTER — Emergency Department (HOSPITAL_BASED_OUTPATIENT_CLINIC_OR_DEPARTMENT_OTHER): Payer: Medicare Other

## 2022-06-28 DIAGNOSIS — R52 Pain, unspecified: Secondary | ICD-10-CM | POA: Diagnosis not present

## 2022-06-28 DIAGNOSIS — M8580 Other specified disorders of bone density and structure, unspecified site: Secondary | ICD-10-CM | POA: Diagnosis present

## 2022-06-28 DIAGNOSIS — R296 Repeated falls: Secondary | ICD-10-CM | POA: Diagnosis present

## 2022-06-28 DIAGNOSIS — I82412 Acute embolism and thrombosis of left femoral vein: Secondary | ICD-10-CM | POA: Diagnosis present

## 2022-06-28 DIAGNOSIS — I82432 Acute embolism and thrombosis of left popliteal vein: Secondary | ICD-10-CM | POA: Diagnosis present

## 2022-06-28 DIAGNOSIS — M5030 Other cervical disc degeneration, unspecified cervical region: Secondary | ICD-10-CM | POA: Diagnosis present

## 2022-06-28 DIAGNOSIS — M109 Gout, unspecified: Secondary | ICD-10-CM | POA: Diagnosis present

## 2022-06-28 DIAGNOSIS — Z96641 Presence of right artificial hip joint: Secondary | ICD-10-CM | POA: Diagnosis present

## 2022-06-28 DIAGNOSIS — R0602 Shortness of breath: Secondary | ICD-10-CM

## 2022-06-28 DIAGNOSIS — I129 Hypertensive chronic kidney disease with stage 1 through stage 4 chronic kidney disease, or unspecified chronic kidney disease: Secondary | ICD-10-CM | POA: Diagnosis present

## 2022-06-28 DIAGNOSIS — E785 Hyperlipidemia, unspecified: Secondary | ICD-10-CM | POA: Diagnosis present

## 2022-06-28 DIAGNOSIS — E872 Acidosis, unspecified: Secondary | ICD-10-CM | POA: Diagnosis present

## 2022-06-28 DIAGNOSIS — K219 Gastro-esophageal reflux disease without esophagitis: Secondary | ICD-10-CM | POA: Diagnosis present

## 2022-06-28 DIAGNOSIS — I82492 Acute embolism and thrombosis of other specified deep vein of left lower extremity: Secondary | ICD-10-CM

## 2022-06-28 DIAGNOSIS — Z7982 Long term (current) use of aspirin: Secondary | ICD-10-CM

## 2022-06-28 DIAGNOSIS — E1159 Type 2 diabetes mellitus with other circulatory complications: Secondary | ICD-10-CM | POA: Diagnosis not present

## 2022-06-28 DIAGNOSIS — N1831 Chronic kidney disease, stage 3a: Secondary | ICD-10-CM | POA: Diagnosis present

## 2022-06-28 DIAGNOSIS — I82442 Acute embolism and thrombosis of left tibial vein: Secondary | ICD-10-CM | POA: Diagnosis present

## 2022-06-28 DIAGNOSIS — Z90710 Acquired absence of both cervix and uterus: Secondary | ICD-10-CM

## 2022-06-28 DIAGNOSIS — E1122 Type 2 diabetes mellitus with diabetic chronic kidney disease: Secondary | ICD-10-CM | POA: Diagnosis present

## 2022-06-28 DIAGNOSIS — I82452 Acute embolism and thrombosis of left peroneal vein: Secondary | ICD-10-CM | POA: Diagnosis present

## 2022-06-28 DIAGNOSIS — Z85828 Personal history of other malignant neoplasm of skin: Secondary | ICD-10-CM

## 2022-06-28 DIAGNOSIS — M1990 Unspecified osteoarthritis, unspecified site: Secondary | ICD-10-CM | POA: Diagnosis present

## 2022-06-28 DIAGNOSIS — M7989 Other specified soft tissue disorders: Secondary | ICD-10-CM | POA: Diagnosis not present

## 2022-06-28 DIAGNOSIS — N390 Urinary tract infection, site not specified: Secondary | ICD-10-CM | POA: Diagnosis not present

## 2022-06-28 DIAGNOSIS — Z8249 Family history of ischemic heart disease and other diseases of the circulatory system: Secondary | ICD-10-CM

## 2022-06-28 DIAGNOSIS — I824Y2 Acute embolism and thrombosis of unspecified deep veins of left proximal lower extremity: Principal | ICD-10-CM

## 2022-06-28 DIAGNOSIS — Z87891 Personal history of nicotine dependence: Secondary | ICD-10-CM

## 2022-06-28 DIAGNOSIS — I82409 Acute embolism and thrombosis of unspecified deep veins of unspecified lower extremity: Secondary | ICD-10-CM

## 2022-06-28 DIAGNOSIS — Z96651 Presence of right artificial knee joint: Secondary | ICD-10-CM | POA: Diagnosis present

## 2022-06-28 DIAGNOSIS — K8689 Other specified diseases of pancreas: Secondary | ICD-10-CM | POA: Diagnosis present

## 2022-06-28 DIAGNOSIS — M79641 Pain in right hand: Secondary | ICD-10-CM | POA: Diagnosis present

## 2022-06-28 DIAGNOSIS — Z66 Do not resuscitate: Secondary | ICD-10-CM | POA: Diagnosis present

## 2022-06-28 DIAGNOSIS — I82422 Acute embolism and thrombosis of left iliac vein: Secondary | ICD-10-CM | POA: Diagnosis present

## 2022-06-28 DIAGNOSIS — R269 Unspecified abnormalities of gait and mobility: Secondary | ICD-10-CM | POA: Diagnosis present

## 2022-06-28 DIAGNOSIS — O2230 Deep phlebothrombosis in pregnancy, unspecified trimester: Secondary | ICD-10-CM | POA: Insufficient documentation

## 2022-06-28 DIAGNOSIS — Z79899 Other long term (current) drug therapy: Secondary | ICD-10-CM

## 2022-06-28 DIAGNOSIS — Z885 Allergy status to narcotic agent status: Secondary | ICD-10-CM

## 2022-06-28 DIAGNOSIS — Z888 Allergy status to other drugs, medicaments and biological substances status: Secondary | ICD-10-CM

## 2022-06-28 LAB — URINALYSIS, ROUTINE W REFLEX MICROSCOPIC
Bilirubin Urine: NEGATIVE
Glucose, UA: NEGATIVE mg/dL
Hgb urine dipstick: NEGATIVE
Ketones, ur: NEGATIVE mg/dL
Nitrite: NEGATIVE
Protein, ur: NEGATIVE mg/dL
Specific Gravity, Urine: 1.02 (ref 1.005–1.030)
pH: 5 (ref 5.0–8.0)

## 2022-06-28 LAB — BASIC METABOLIC PANEL
Anion gap: 13 (ref 5–15)
BUN: 48 mg/dL — ABNORMAL HIGH (ref 8–23)
CO2: 19 mmol/L — ABNORMAL LOW (ref 22–32)
Calcium: 9.2 mg/dL (ref 8.9–10.3)
Chloride: 102 mmol/L (ref 98–111)
Creatinine, Ser: 1.47 mg/dL — ABNORMAL HIGH (ref 0.44–1.00)
GFR, Estimated: 32 mL/min — ABNORMAL LOW (ref >60)
Glucose, Bld: 105 mg/dL — ABNORMAL HIGH (ref 70–99)
Potassium: 4.6 mmol/L (ref 3.5–5.1)
Sodium: 134 mmol/L — ABNORMAL LOW (ref 135–145)

## 2022-06-28 LAB — CBC WITH DIFFERENTIAL/PLATELET
Abs Immature Granulocytes: 0.09 10*3/uL — ABNORMAL HIGH (ref 0.00–0.07)
Basophils Absolute: 0 10*3/uL (ref 0.0–0.1)
Basophils Relative: 0 %
Eosinophils Absolute: 0.2 10*3/uL (ref 0.0–0.5)
Eosinophils Relative: 2 %
HCT: 38.4 % (ref 36.0–46.0)
Hemoglobin: 12.2 g/dL (ref 12.0–15.0)
Immature Granulocytes: 1 %
Lymphocytes Relative: 8 %
Lymphs Abs: 1 10*3/uL (ref 0.7–4.0)
MCH: 31.5 pg (ref 26.0–34.0)
MCHC: 31.8 g/dL (ref 30.0–36.0)
MCV: 99.2 fL (ref 80.0–100.0)
Monocytes Absolute: 0.9 10*3/uL (ref 0.1–1.0)
Monocytes Relative: 7 %
Neutro Abs: 10.2 10*3/uL — ABNORMAL HIGH (ref 1.7–7.7)
Neutrophils Relative %: 82 %
Platelets: 278 10*3/uL (ref 150–400)
RBC: 3.87 MIL/uL (ref 3.87–5.11)
RDW: 13.3 % (ref 11.5–15.5)
WBC: 12.5 10*3/uL — ABNORMAL HIGH (ref 4.0–10.5)
nRBC: 0 % (ref 0.0–0.2)

## 2022-06-28 MED ORDER — LORATADINE 10 MG PO TABS
10.0000 mg | ORAL_TABLET | Freq: Every day | ORAL | Status: DC
  Administered 2022-06-29 – 2022-07-03 (×5): 10 mg via ORAL
  Filled 2022-06-28 (×5): qty 1

## 2022-06-28 MED ORDER — ACETAMINOPHEN 325 MG PO TABS
650.0000 mg | ORAL_TABLET | Freq: Four times a day (QID) | ORAL | Status: DC | PRN
  Administered 2022-06-29 – 2022-07-01 (×5): 650 mg via ORAL
  Filled 2022-06-28 (×5): qty 2

## 2022-06-28 MED ORDER — POLYVINYL ALCOHOL 1.4 % OP SOLN
1.0000 [drp] | Freq: Every day | OPHTHALMIC | Status: DC
  Administered 2022-06-29 – 2022-06-30 (×2): 1 [drp] via OPHTHALMIC
  Administered 2022-07-01 – 2022-07-02 (×2): 2 [drp] via OPHTHALMIC
  Administered 2022-07-03: 1 [drp] via OPHTHALMIC
  Filled 2022-06-28: qty 15

## 2022-06-28 MED ORDER — HYDROMORPHONE HCL 1 MG/ML IJ SOLN
0.5000 mg | INTRAMUSCULAR | Status: DC | PRN
  Filled 2022-06-28: qty 1

## 2022-06-28 MED ORDER — ONDANSETRON HCL 4 MG PO TABS: 4.0000 mg | ORAL_TABLET | Freq: Four times a day (QID) | ORAL | Status: DC | PRN

## 2022-06-28 MED ORDER — FLUTICASONE PROPIONATE 50 MCG/ACT NA SUSP
2.0000 | Freq: Every day | NASAL | Status: DC | PRN
  Administered 2022-06-30: 2 via NASAL
  Filled 2022-06-28: qty 16

## 2022-06-28 MED ORDER — RAMIPRIL 5 MG PO CAPS
5.0000 mg | ORAL_CAPSULE | Freq: Every day | ORAL | Status: DC
  Administered 2022-06-29 – 2022-07-03 (×5): 5 mg via ORAL
  Filled 2022-06-28 (×5): qty 1

## 2022-06-28 MED ORDER — CEFAZOLIN SODIUM-DEXTROSE 1-4 GM/50ML-% IV SOLN
1.0000 g | Freq: Once | INTRAVENOUS | Status: DC
  Filled 2022-06-28: qty 50

## 2022-06-28 MED ORDER — MIRABEGRON ER 25 MG PO TB24
25.0000 mg | ORAL_TABLET | Freq: Every day | ORAL | Status: DC
  Administered 2022-06-29 – 2022-07-03 (×5): 25 mg via ORAL
  Filled 2022-06-28 (×5): qty 1

## 2022-06-28 MED ORDER — ACETAMINOPHEN 650 MG RE SUPP: 650.0000 mg | Freq: Four times a day (QID) | RECTAL | Status: DC | PRN

## 2022-06-28 MED ORDER — SODIUM BICARBONATE 650 MG PO TABS
325.0000 mg | ORAL_TABLET | Freq: Every day | ORAL | Status: DC
  Administered 2022-06-28: 325 mg via ORAL
  Filled 2022-06-28: qty 1

## 2022-06-28 MED ORDER — ONDANSETRON HCL 4 MG/2ML IJ SOLN: 4.0000 mg | Freq: Four times a day (QID) | INTRAMUSCULAR | Status: DC | PRN

## 2022-06-28 MED ORDER — SALINE SPRAY 0.65 % NA SOLN: 1.0000 | Freq: Four times a day (QID) | NASAL | Status: DC | PRN

## 2022-06-28 MED ORDER — HEPARIN (PORCINE) 25000 UT/250ML-% IV SOLN
1100.0000 [IU]/h | INTRAVENOUS | Status: DC
  Administered 2022-06-28 – 2022-06-29 (×2): 1100 [IU]/h via INTRAVENOUS
  Filled 2022-06-28 (×2): qty 250

## 2022-06-28 NOTE — ED Provider Notes (Signed)
[facility] Provider Note   CSN: [id number] Arrival date & time: 06/28/22  1306     History  Chief Complaint  Patient presents with   Leg Swelling    [name] is a 89 y.o. female.  HPI   89 year old female with medical history significant for HLD, HTN, DM 2, GERD, presenting to the emergency department with several days of left leg swelling.  She comes from [facility] due to pitting edema in the leg that started on solutions 06/26/22.  She had x-rays at her facility.  Denies any falls or trauma.  No fevers or chills.  No new redness, mild erythema noted that is in the legs bilaterally.  No warmth.  No chest pain or shortness of breath.  No history of DVT or PE.  The patient is not on anticoagulation.  Home Medications Prior to Admission medications   Medication Sig Start Date End Date Taking? Authorizing Provider  Accu-Chek Softclix Lancets lancets CHECK BLOOD SUGAR 1 TIME A DAY. DX: E11.9 03/22/20   Bedsole, Amy E, MD  aspirin EC 81 MG tablet Take 81 mg by mouth daily. Swallow whole.    [provider]  azelastine (ASTELIN) 0.1 % nasal spray Place 1 spray into both nostrils 2 (two) times daily. Use in each nostril as directed 12/02/21   Crain, Whitney L, PA  Blood Glucose Monitoring Suppl (ACCU-CHEK GUIDE) w/Device KIT 1 each by Does not apply route daily. Use to check blood sugar daily as needed.  Dx: E11.9 03/22/20   Bedsole, Amy E, MD  fluticasone (FLONASE) 50 MCG/ACT nasal spray PLACE 2 SPRAYS INTO BOTH NOSTRILS DAILY AS NEEDED FOR ALLERGIES. 06/10/20   Bedsole, Amy E, MD  glucose blood (ACCU-CHEK GUIDE) test strip Use to check blood sugar 1 time daily 03/22/20   Bedsole, Amy E, MD  Loratadine 10 MG CAPS Take 1 capsule by mouth daily.    [provider]  Multiple Vitamin (MULTIVITAMIN PO) Take 1 tablet by mouth daily.    [provider]  MYRBETRIQ 25 MG TB24 tablet Take 25 mg by mouth daily. 05/05/21    [provider]  Propylene Glycol (SYSTANE BALANCE) 0.6 % SOLN Place 1-2 drops into both eyes daily.    [provider]  ramipril (ALTACE) 5 MG capsule TAKE 1 CAPSULE BY MOUTH EVERY DAY 12/03/20   Bedsole, Amy E, MD  SALINE NASAL SPRAY NA Place 2 sprays into the nose every 6 (six) hours as needed (nasal stuffiness).    [provider]      Allergies    Codeine, Other, and Statins    Review of Systems   Review of Systems  Cardiovascular:  Positive for leg swelling.  All other systems reviewed and are negative.   Physical Exam Updated Vital Signs BP 129/73 (BP Location: Left Arm)   Pulse 86   Temp (!) 97.5 F (36.4 C) (Oral)   Resp (!) 22   Ht '5\' 3"'$  (1.6 m)   Wt 60 kg   SpO2 100%   BMI 23.43 kg/m  Physical Exam Vitals and nursing note reviewed.  Constitutional:      General: She is not in acute distress. HENT:     Head: Normocephalic and atraumatic.  Eyes:     Conjunctiva/sclera: Conjunctivae normal.     Pupils: Pupils are equal, round, and reactive to light.  Cardiovascular:     Rate and Rhythm: Normal rate and regular rhythm.  Pulmonary:  Effort: Pulmonary effort is normal. No respiratory distress.  Abdominal:     General: There is no distension.     Tenderness: There is no guarding.  Musculoskeletal:        General: No tenderness, deformity or signs of injury.     Cervical back: Neck supple.     Right lower leg: No edema.     Left lower leg: Edema present.     Comments: Extensive swelling throughout the left lower extremity with 2-3+ pitting edema, bilateral erythema consistent with likely venous stasis, no warmth, no tenderness or over the patient's erythema. 2+ pulses.  Skin:    Findings: No lesion or rash.  Neurological:     General: No focal deficit present.     Mental Status: She is alert. Mental status is at baseline.     ED Results / Procedures / Treatments   Labs (all labs ordered are listed, but only abnormal results  are displayed) Labs Reviewed  CBC WITH DIFFERENTIAL/PLATELET - Abnormal; Notable for the following components:      Result Value   WBC 12.5 (*)    Neutro Abs 10.2 (*)    Abs Immature Granulocytes 0.09 (*)    All other components within normal limits  BASIC METABOLIC PANEL - Abnormal; Notable for the following components:   Sodium 134 (*)    CO2 19 (*)    Glucose, Bld 105 (*)    BUN 48 (*)    Creatinine, Ser 1.47 (*)    GFR, Estimated 32 (*)    All other components within normal limits  HEPARIN LEVEL (UNFRACTIONATED)    EKG EKG Interpretation  Date/Time:  Wednesday June 28 2022 14:08:28 EST Ventricular Rate:  82 PR Interval:  180 QRS Duration: 88 QT Interval:  383 QTC Calculation: 448 R Axis:   -44 Text Interpretation: Sinus rhythm LVH with secondary repolarization abnormality Inferior infarct, old Anterior infarct, old Confirmed by Regan Lemming ([id number]) on 06/28/2022 3:02:17 PM  Radiology VAS [place] LOWER EXTREMITY VENOUS (DVT) (ONLY [facility] & [facility])  Result Date: 06/28/2022  Lower Venous DVT Study Patient Name:  [name] [facility]  Date of Exam:   06/28/2022 Medical Rec #: [id number]         Accession #:    [id number] Date of Birth: 03-11-1923         Patient Gender: F Patient Age:   36 years Exam Location:  [facility] Procedure:      VAS [place] LOWER EXTREMITY VENOUS (DVT) Referring Phys: Regan Lemming --------------------------------------------------------------------------------  Indications: Swelling, SOB, Pain, and multiple recent falls.  Performing Technologist: McKayla Maag RVT, VT  Examination Guidelines: A complete evaluation includes B-mode imaging, spectral Doppler, color Doppler, and power Doppler as needed of all accessible portions of each vessel. Bilateral testing is considered an integral part of a complete examination. Limited examinations for reoccurring indications may be performed as noted. The reflux portion of the exam is performed with the patient in reverse Trendelenburg.   +-----+---------------+---------+-----------+----------+--------------+ RIGHTCompressibilityPhasicitySpontaneityPropertiesThrombus Aging +-----+---------------+---------+-----------+----------+--------------+ CFV  Full           Yes      Yes                                 +-----+---------------+---------+-----------+----------+--------------+ SFJ  Full                                                        +-----+---------------+---------+-----------+----------+--------------+   +---------+---------------+---------+-----------+----------+-------------------+  LEFT     CompressibilityPhasicitySpontaneityPropertiesThrombus Aging      +---------+---------------+---------+-----------+----------+-------------------+ CFV      None           No       No                   Acute               +---------+---------------+---------+-----------+----------+-------------------+ SFJ      None           No       No                   Acute               +---------+---------------+---------+-----------+----------+-------------------+ FV Prox  None           No       No                   Acute               +---------+---------------+---------+-----------+----------+-------------------+ FV Mid   None           No       No                   Acute               +---------+---------------+---------+-----------+----------+-------------------+ FV DistalNone           No       No                   Acute               +---------+---------------+---------+-----------+----------+-------------------+ PFV      None           No       No                   Acute               +---------+---------------+---------+-----------+----------+-------------------+ POP      None           No       No                   Acute               +---------+---------------+---------+-----------+----------+-------------------+ PTV      None           No       No                   Acute                +---------+---------------+---------+-----------+----------+-------------------+ PERO     None           No       No                   Acute               +---------+---------------+---------+-----------+----------+-------------------+ GSV      None           No       No                   Acute SVT junction  to knee             +---------+---------------+---------+-----------+----------+-------------------+ EIV      None           No       No                   Acute               +---------+---------------+---------+-----------+----------+-------------------+ Unable to visualize IVC and iliac veins due to patient body habitus and not being NPO.   Summary: RIGHT: - No evidence of common femoral vein obstruction.  LEFT: - Findings consistent with acute deep vein thrombosis involving the external iliac vein, left common femoral vein, SF junction, left femoral vein, left proximal profunda vein, left popliteal vein, left posterior tibial veins, and left peroneal veins. - Findings consistent with acute superficial vein thrombosis involving the left great saphenous vein. - No cystic structure found in the popliteal fossa.  *See table(s) above for measurements and observations.    Preliminary     Procedures Procedures    Medications Ordered in ED Medications  heparin ADULT infusion 100 units/mL (25000 units/277m) (1,100 Units/hr Intravenous New Bag/Given 06/28/22 1555)    ED Course/ Medical Decision Making/ A&P                             Medical Decision Making Amount and/or Complexity of Data Reviewed Labs: ordered.  Risk Prescription drug management. Decision regarding hospitalization.    89year old female with medical history significant for HLD, HTN, DM 2, GERD, presenting to the emergency department with several days of left leg swelling.  [facility] solutions 06/26/22.  She had x-rays at her facility.  Denies any falls or trauma.  No fevers or chills.  No new redness, mild erythema noted that is in the legs bilaterally.  No warmth.  No chest pain or shortness of breath.  No history of DVT or PE.  The patient is not on anticoagulation.  On arrival, the patient was afebrile, not tachycardic and mildly tachypneic, BP 129/73, saturating 100% on room air.  Physical exam concerning for extensive swelling throughout the left leg concerning for potential DVT, no warmth, minimal erythema bilaterally in the legs consistent with venous stasis, no clear evidence of cellulitis.   Laboratory evaluation significant for CBC with leukocytosis 12.5, no anemia, BMP with elevated serum creatinine 1.47 from baseline around 1.2.   DVT ultrasound was performed and was concerning for extensive DVT in the left leg. Vascular surgery consulted for recommendations, plan for admission for observation on heparin, further interventions pending vascular recommendations.  Hospitalist medicine consulted for admission, Dr. ZRoosevelt Locksaccepting.   Final Clinical Impression(s) / ED Diagnoses Final diagnoses:  Acute deep vein thrombosis (DVT) of proximal vein of left lower extremity ([facility]    Rx / DC Orders ED Discharge Orders     None         LRegan Lemming MD 06/28/22 1611

## 2022-06-28 NOTE — Consult Note (Addendum)
Hospital Consult    Reason for Consult: Left lower extremity DVT Requesting Physician: Dr. Armandina Gemma MRN #:  [id number]  History of Present Illness: This is a 89 y.o. female being seen in consultation for evaluation of extensive left lower extremity DVT.  She currently resides in a assisted living facility.  She sustained a fall on Sunday 3/3.   the following day she developed edema of her left leg.  Over the past 48 hours edema was worsening and she also developed pain.  She was brought to the emergency department and found to have an extensive DVT involving the external iliac vein of the left lower extremity.  She has no history of DVT.  She denies tobacco abuse.  She is not experiencing any SOB or pleuritic chest pain.  Past Medical History:  Diagnosis Date   Acute pharyngitis    Acute sinusitis, unspecified    Acute upper respiratory infections of unspecified site    Allergy    Anal and rectal polyp    BCC (basal cell carcinoma) 05/18/2021   right nasal sidewall, radiaiton completed 07/29/21   Cataract    History of   Cavernous angioma    Degenerative cervical disc 02/22/2006   Diabetes mellitus    Type II   GERD (gastroesophageal reflux disease)    Gout    Hemangioma of unspecified site    Hyperlipidemia    Hypertension    Orthostatic hypotension 04/24/2006   Hospital   Osteoarthritis    Osteopenia    Other abscess of vulva    Palpitations    SCC (squamous cell carcinoma) 05/18/2021   left dorsal hand over MCP, EDC 06/22/21   SCC (squamous cell carcinoma) 12/29/2021   right upper arm, ED&C 02/02/22   Squamous cell carcinoma in situ (SCCIS) 05/18/2021   right lower leg medial, EDC 06/22/21   Squamous cell carcinoma in situ (SCCIS) 12/29/2021   left superior shoulder, EDC 02/02/22   Syncope and collapse    1 episode    Past Surgical History:  Procedure Laterality Date   ABDOMINAL HYSTERECTOMY     Aortic dopplers     Mild plaque.  EEG okay   CATARACT EXTRACTION      Cavernous angioma     Cavernous hemangioma  04/2004   Hospital   TOTAL HIP ARTHROPLASTY Right 11/01/2020   Procedure: TOTAL HIP ARTHROPLASTY ANTERIOR APPROACH;  Surgeon: Gaynelle Arabian, MD;  Location: [facility];  Service: Orthopedics;  Laterality: Right;   TOTAL KNEE ARTHROPLASTY     Right    Allergies  Allergen Reactions   Codeine Nausea And Vomiting   Other     Other reaction(s): nausea   Statins Other (See Comments)    Pt does not remember the reaction    Prior to Admission medications   Medication Sig Start Date End Date Taking? Authorizing Provider  Accu-Chek Softclix Lancets lancets CHECK BLOOD SUGAR 1 TIME A DAY. DX: E11.9 03/22/20   Bedsole, Amy E, MD  aspirin EC 81 MG tablet Take 81 mg by mouth daily. Swallow whole.    [provider]  azelastine (ASTELIN) 0.1 % nasal spray Place 1 spray into both nostrils 2 (two) times daily. Use in each nostril as directed 12/02/21   Crain, Whitney L, PA  Blood Glucose Monitoring Suppl (ACCU-CHEK GUIDE) w/Device KIT 1 each by Does not apply route daily. Use to check blood sugar daily as needed.  Dx: E11.9 03/22/20   [name], MD  fluticasone (FLONASE) 50 MCG/ACT  nasal spray PLACE 2 SPRAYS INTO BOTH NOSTRILS DAILY AS NEEDED FOR ALLERGIES. 06/10/20   Bedsole, Amy E, MD  glucose blood (ACCU-CHEK GUIDE) test strip Use to check blood sugar 1 time daily 03/22/20   Bedsole, Amy E, MD  Loratadine 10 MG CAPS Take 1 capsule by mouth daily.    [provider]  Multiple Vitamin (MULTIVITAMIN PO) Take 1 tablet by mouth daily.    [provider]  MYRBETRIQ 25 MG TB24 tablet Take 25 mg by mouth daily. 05/05/21   [provider]  Propylene Glycol (SYSTANE BALANCE) 0.6 % SOLN Place 1-2 drops into both eyes daily.    [provider]  ramipril (ALTACE) 5 MG capsule TAKE 1 CAPSULE BY MOUTH EVERY DAY 12/03/20   Bedsole, Amy E, MD  SALINE NASAL SPRAY NA Place 2 sprays into the nose every 6 (six) hours as needed (nasal  stuffiness).    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   Activity limited secondary to knee discomfort/arthritis, but going to church exersice group two times a week.             Social Determinants of Health   Financial Resource Strain: Low Risk  (10/13/2020)   Overall Financial Resource Strain (CARDIA)    Difficulty of Paying Living Expenses: Not hard at all  Food Insecurity: No Food Insecurity (10/13/2020)   Hunger Vital Sign    Worried About Running Out of Food in the Last Year: Never true    Ran Out of Food in the Last Year: Never true  Transportation Needs: No Transportation Needs (10/13/2020)   PRAPARE - Hydrologist (Medical): No    Lack of Transportation (Non-Medical): No  Physical Activity: Inactive (10/13/2020)   Exercise Vital Sign    Days of Exercise per Week: 0 days    Minutes of Exercise per Session: 0 min  Stress: No Stress Concern Present (10/13/2020)   [place]    Feeling of Stress : Not at all  Social Connections: Not on file  Intimate Partner Violence: Not At Risk (10/13/2020)   Humiliation, Afraid, Rape, and Kick questionnaire    Fear of Current or Ex-Partner: No    Emotionally Abused: No     Physically Abused: No    Sexually Abused: No     Family History  Problem Relation Age of Onset   Heart disease Other        CHF   Cancer Maternal Aunt        Breast CA    ROS: Otherwise negative unless mentioned in HPI  Physical Examination  Vitals:   06/28/22  1317 06/28/22 1615  BP: 129/73 135/64  Pulse: 86 80  Resp: (!) 22 14  Temp: (!) 97.5 F (36.4 C)   SpO2: 100% 97%   Body mass index is 23.43 kg/m.  General:  WDWN in NAD Gait: Not observed HENT: WNL, normocephalic Pulmonary: normal non-labored breathing, without Rales, rhonchi,  wheezing Cardiac: regular Abdomen:  soft, NT/ND, no masses Skin: without rashes Vascular Exam/Pulses: Brisk DP and PT signals bilaterally Extremities: Pitting edema of the left leg to the distal thigh however calf is soft Musculoskeletal: no muscle wasting or atrophy  Neurologic: A&O X 3;  No focal weakness or paresthesias are detected; speech is fluent/normal Psychiatric:  The pt has Normal affect. Lymph:  Unremarkable  CBC    Component Value Date/Time   WBC 12.5 (H) 06/28/2022 1322   RBC 3.87 06/28/2022 1322   HGB 12.2 06/28/2022 1322   HCT 38.4 06/28/2022 1322   PLT 278 06/28/2022 1322   MCV 99.2 06/28/2022 1322   MCH 31.5 06/28/2022 1322   MCHC 31.8 06/28/2022 1322   RDW 13.3 06/28/2022 1322   LYMPHSABS 1.0 06/28/2022 1322   MONOABS 0.9 06/28/2022 1322   EOSABS 0.2 06/28/2022 1322   BASOSABS 0.0 06/28/2022 1322    BMET    Component Value Date/Time   NA 134 (L) 06/28/2022 1322   K 4.6 06/28/2022 1322   CL 102 06/28/2022 1322   CO2 19 (L) 06/28/2022 1322   GLUCOSE 105 (H) 06/28/2022 1322   BUN 48 (H) 06/28/2022 1322   CREATININE 1.47 (H) 06/28/2022 1322   CREATININE 1.10 (H) 05/10/2018 1639   CALCIUM 9.2 06/28/2022 1322   GFRNONAA 32 (L) 06/28/2022 1322   GFRAA >60 07/23/2019 1253    COAGS: Lab Results  Component Value Date   INR 1.0 10/29/2020   INR 0.9 04/15/2019   INR 0.97 10/07/2016      Non-Invasive Vascular Imaging:   Left lower extremity venous duplex with extensive DVT extending to the level of the external iliac vein    ASSESSMENT/PLAN: This is a 89 y.o. female with left lower extremity DVT involving the external iliac vein by venous duplex  -Left leg is edematous however there are no signs for compartment or phlegmasia.  She has brisk DP and PT signals of her left foot by Doppler.  Agree with initiation of IV heparin.  Given the patient's age and level of mobility, she would not be a candidate for percutaneous mechanical thrombectomy.  This procedure comes with risks and would likely provide little to no benefit.  Defer anticoagulation agent to primary team.  She will likely need to be anticoagulated for at least 3 months.  Nothing further to offer from a vascular surgery standpoint.  On-call vascular surgeon Dr. Stanford Breed was also involved in the evaluation and management plan of this patient.   Dagoberto Ligas PA-C [facility] [phone]  VASCULAR STAFF ADDENDUM: I have independently interviewed and examined the patient. I agree with the above.  Please call for questions.  Yevonne Aline. Stanford Breed, MD [facility] Phone Number: [phone] 06/28/2022 4:41 PM

## 2022-06-28 NOTE — H&P (Signed)
History and Physical    [name] [id number] DOB: [birth year] DOA: 06/28/2022  PCP: Josetta Huddle, MD (Confirm with patient/family/NH records and if not entered, this has to be entered at [facility] point of entry) Patient coming from: Assisted living  I have personally briefly reviewed patient's old medical records in [place]  Chief Complaint: Left leg swelling and pain  HPI: [name] is a 89 y.o. female with medical history significant of HTN, HLD, IIDM on diet control, CKD stage IIIa, GERD, gout, chronic pancreas sufficiency, presented with worsening of left leg swelling and pain.  Symptoms started 2 days ago patient started to have left leg swelling pain, "it feels very heavy". She denied any fall, no other injuries.  Denies any chest pain no cough no shortness of breath. No DVT or PE in the past.  ED Course: Afebrile, borderline bradycardia, blood pressure 130/80, nonhypoxic.  Decreased yesterday positive extensive DVT involving left iliac, femoral vein to popliteal vein  Review of Systems: As per HPI otherwise 14 point review of systems negative.    Past Medical History:  Diagnosis Date   Acute pharyngitis    Acute sinusitis, unspecified    Acute upper respiratory infections of unspecified site    Allergy    Anal and rectal polyp    BCC (basal cell carcinoma) 05/18/2021   right nasal sidewall, radiaiton completed 07/29/21   Cataract    History of   Cavernous angioma    Degenerative cervical disc 02/22/2006   Diabetes mellitus    Type II   GERD (gastroesophageal reflux disease)    Gout    Hemangioma of unspecified site    Hyperlipidemia    Hypertension    Orthostatic hypotension 04/24/2006   Hospital   Osteoarthritis    Osteopenia    Other abscess of vulva    Palpitations    SCC (squamous cell carcinoma) 05/18/2021   left dorsal hand over MCP, EDC 06/22/21   SCC (squamous cell carcinoma) 12/29/2021   right upper arm, ED&C 02/02/22   Squamous cell  carcinoma in situ (SCCIS) 05/18/2021   right lower leg medial, EDC 06/22/21   Squamous cell carcinoma in situ (SCCIS) 12/29/2021   left superior shoulder, EDC 02/02/22   Syncope and collapse    1 episode    Past Surgical History:  Procedure Laterality Date   ABDOMINAL HYSTERECTOMY     Aortic dopplers     Mild plaque.  EEG okay   CATARACT EXTRACTION     Cavernous angioma     Cavernous hemangioma  04/2004   Hospital   TOTAL HIP ARTHROPLASTY Right 11/01/2020   Procedure: TOTAL HIP ARTHROPLASTY ANTERIOR APPROACH;  Surgeon: Gaynelle Arabian, MD;  Location: [facility];  Service: Orthopedics;  Laterality: Right;   TOTAL KNEE ARTHROPLASTY     Right     reports that she has quit smoking. She has never used smokeless tobacco. She reports that she does not currently use alcohol. She reports that she does not use drugs.  Allergies  Allergen Reactions   Codeine Nausea And Vomiting   Other     Other reaction(s): nausea   Statins Other (See Comments)    Pt does not remember the reaction    Family History  Problem Relation Age of Onset   Heart disease Other        CHF   Cancer Maternal Aunt        Breast CA     Prior to Admission medications  Medication Sig Start Date End Date Taking? Authorizing Provider  Accu-Chek Softclix Lancets lancets CHECK BLOOD SUGAR 1 TIME A DAY. DX: E11.9 03/22/20   Bedsole, Amy E, MD  aspirin EC 81 MG tablet Take 81 mg by mouth daily. Swallow whole.    [provider]  azelastine (ASTELIN) 0.1 % nasal spray Place 1 spray into both nostrils 2 (two) times daily. Use in each nostril as directed 12/02/21   Crain, Whitney L, PA  Blood Glucose Monitoring Suppl (ACCU-CHEK GUIDE) w/Device KIT 1 each by Does not apply route daily. Use to check blood sugar daily as needed.  Dx: E11.9 03/22/20   Bedsole, Amy E, MD  fluticasone (FLONASE) 50 MCG/ACT nasal spray PLACE 2 SPRAYS INTO BOTH NOSTRILS DAILY AS NEEDED FOR ALLERGIES. 06/10/20   Bedsole, Amy E, MD  glucose blood  (ACCU-CHEK GUIDE) test strip Use to check blood sugar 1 time daily 03/22/20   Bedsole, Amy E, MD  Loratadine 10 MG CAPS Take 1 capsule by mouth daily.    [provider]  Multiple Vitamin (MULTIVITAMIN PO) Take 1 tablet by mouth daily.    [provider]  MYRBETRIQ 25 MG TB24 tablet Take 25 mg by mouth daily. 05/05/21   [provider]  Propylene Glycol (SYSTANE BALANCE) 0.6 % SOLN Place 1-2 drops into both eyes daily.    [provider]  ramipril (ALTACE) 5 MG capsule TAKE 1 CAPSULE BY MOUTH EVERY DAY 12/03/20   Bedsole, Amy E, MD  SALINE NASAL SPRAY NA Place 2 sprays into the nose every 6 (six) hours as needed (nasal stuffiness).    [provider]    Physical Exam: Vitals:   06/28/22 1318 06/28/22 1615 06/28/22 1626 06/28/22 1700  BP:  135/64  104/87  Pulse:  80  85  Resp:  14  19  Temp:   97.7 F (36.5 C)   TempSrc:   Oral   SpO2:  97%  96%  Weight: 60 kg     Height: '5\' 3"'$  (1.6 m)       Constitutional: NAD, calm, comfortable Vitals:   06/28/22 1318 06/28/22 1615 06/28/22 1626 06/28/22 1700  BP:  135/64  104/87  Pulse:  80  85  Resp:  14  19  Temp:   97.7 F (36.5 C)   TempSrc:   Oral   SpO2:  97%  96%  Weight: 60 kg     Height: '5\' 3"'$  (1.6 m)      Eyes: PERRL, lids and conjunctivae normal ENMT: Mucous membranes are moist. Posterior pharynx clear of any exudate or lesions.Normal dentition.  Neck: normal, supple, no masses, no thyromegaly Respiratory: clear to auscultation bilaterally, no wheezing, no crackles. Normal respiratory effort. No accessory muscle use.  Cardiovascular: Regular rate and rhythm, no murmurs / rubs / gallops. 2+ extremity edema on left leg only. 2+ pedal pulses. No carotid bruits.  Abdomen: no tenderness, no masses palpated. No hepatosplenomegaly. Bowel sounds positive.  Musculoskeletal: no clubbing / cyanosis. No joint deformity upper and lower extremities. Good ROM, no contractures. Normal muscle tone.   Skin: no rashes, lesions, ulcers. No induration Neurologic: CN 2-12 grossly intact. Sensation intact, DTR normal. Strength 5/5 in all 4.  Psychiatric: Normal judgment and insight. Alert and oriented x 3. Normal mood.     Labs on Admission: I have personally reviewed following labs and imaging studies  CBC: Recent Labs  Lab 06/28/22 1322  WBC 12.5*  NEUTROABS 10.2*  HGB 12.2  HCT 38.4  MCV 99.2  PLT 0000000   Basic Metabolic Panel: Recent Labs  Lab 06/28/22 1322  NA 134*  K 4.6  CL 102  CO2 19*  GLUCOSE 105*  BUN 48*  CREATININE 1.47*  CALCIUM 9.2   GFR: Estimated Creatinine Clearance: 18.1 mL/min (A) (by C-G formula based on SCr of 1.47 mg/dL (H)). Liver Function Tests: No results for input(s): "AST", "ALT", "ALKPHOS", "BILITOT", "PROT", "ALBUMIN" in the last 168 hours. No results for input(s): "LIPASE", "AMYLASE" in the last 168 hours. No results for input(s): "AMMONIA" in the last 168 hours. Coagulation Profile: No results for input(s): "INR", "PROTIME" in the last 168 hours. Cardiac Enzymes: No results for input(s): "CKTOTAL", "CKMB", "CKMBINDEX", "TROPONINI" in the last 168 hours. BNP (last 3 results) No results for input(s): "PROBNP" in the last 8760 hours. HbA1C: No results for input(s): "HGBA1C" in the last 72 hours. CBG: No results for input(s): "GLUCAP" in the last 168 hours. Lipid Profile: No results for input(s): "CHOL", "HDL", "LDLCALC", "TRIG", "CHOLHDL", "LDLDIRECT" in the last 72 hours. Thyroid Function Tests: No results for input(s): "TSH", "T4TOTAL", "FREET4", "T3FREE", "THYROIDAB" in the last 72 hours. Anemia Panel: No results for input(s): "VITAMINB12", "FOLATE", "FERRITIN", "TIBC", "IRON", "RETICCTPCT" in the last 72 hours. Urine analysis:    Component Value Date/Time   COLORURINE YELLOW 06/17/2022 0115   APPEARANCEUR HAZY (A) 06/17/2022 0115   LABSPEC 1.015 06/17/2022 0115   PHURINE 5.0 06/17/2022 0115   GLUCOSEU NEGATIVE 06/17/2022 0115    HGBUR NEGATIVE 06/17/2022 0115   BILIRUBINUR NEGATIVE 06/17/2022 0115   BILIRUBINUR Negative 05/21/2020 1210   KETONESUR NEGATIVE 06/17/2022 0115   PROTEINUR NEGATIVE 06/17/2022 0115   UROBILINOGEN 0.2 12/02/2021 1623   NITRITE NEGATIVE 06/17/2022 0115   LEUKOCYTESUR TRACE (A) 06/17/2022 0115    Radiological Exams on Admission: VAS [place] LOWER EXTREMITY VENOUS (DVT) (ONLY [facility])  Result Date: 06/28/2022  Lower Venous DVT Study Patient Name:  AAYA GADWAY  Date of Exam:   06/28/2022 Medical Rec #: [id number]         Accession #:    [id number] Date of Birth: [birth year]         Patient Gender: F Patient Age:   73 years Exam Location:  [facility] Procedure:      VAS [place] LOWER EXTREMITY VENOUS (DVT) Referring Phys: Regan Lemming --------------------------------------------------------------------------------  Indications: Swelling, SOB, Pain, and multiple recent falls.  Performing Technologist: McKayla Maag RVT, VT  Examination Guidelines: A complete evaluation includes B-mode imaging, spectral Doppler, color Doppler, and power Doppler as needed of all accessible portions of each vessel. Bilateral testing is considered an integral part of a complete examination. Limited examinations for reoccurring indications may be performed as noted. The reflux portion of the exam is performed with the patient in reverse Trendelenburg.  +-----+---------------+---------+-----------+----------+--------------+ RIGHTCompressibilityPhasicitySpontaneityPropertiesThrombus Aging +-----+---------------+---------+-----------+----------+--------------+ CFV  Full           Yes      Yes                                 +-----+---------------+---------+-----------+----------+--------------+ SFJ  Full                                                        +-----+---------------+---------+-----------+----------+--------------+   +---------+---------------+---------+-----------+----------+-------------------+  LEFT     CompressibilityPhasicitySpontaneityPropertiesThrombus Aging      +---------+---------------+---------+-----------+----------+-------------------+ CFV      None           No       No                   Acute               +---------+---------------+---------+-----------+----------+-------------------+ SFJ      None           No       No                   Acute               +---------+---------------+---------+-----------+----------+-------------------+ FV Prox  None           No       No                   Acute               +---------+---------------+---------+-----------+----------+-------------------+ FV Mid   None           No       No                   Acute               +---------+---------------+---------+-----------+----------+-------------------+ FV DistalNone           No       No                   Acute               +---------+---------------+---------+-----------+----------+-------------------+ PFV      None           No       No                   Acute               +---------+---------------+---------+-----------+----------+-------------------+ POP      None           No       No                   Acute               +---------+---------------+---------+-----------+----------+-------------------+ PTV      None           No       No                   Acute               +---------+---------------+---------+-----------+----------+-------------------+ PERO     None           No       No                   Acute               +---------+---------------+---------+-----------+----------+-------------------+ GSV      None           No       No                   Acute SVT junction  to knee             +---------+---------------+---------+-----------+----------+-------------------+ EIV      None           No       No                   Acute                +---------+---------------+---------+-----------+----------+-------------------+ Unable to visualize IVC and iliac veins due to patient body habitus and not being NPO.    Summary: RIGHT: - No evidence of common femoral vein obstruction.  LEFT: - Findings consistent with acute deep vein thrombosis involving the external iliac vein, left common femoral vein, SF junction, left femoral vein, left proximal profunda vein, left popliteal vein, left posterior tibial veins, and left peroneal veins. - Findings consistent with acute superficial vein thrombosis involving the left great saphenous vein. - No cystic structure found in the popliteal fossa.  *See table(s) above for measurements and observations. Electronically signed by Jamelle Haring on 06/28/2022 at 4:12:09 PM.    Final     EKG: Independently reviewed.  Sinus rhythm, no acute ST changes.  Assessment/Plan Principal Problem:   DVT (deep vein thrombosis) in pregnancy Active Problems:   DVT (deep venous thrombosis) (HCC)  (please populate well all problems here in Problem List. (For example, if patient is on BP meds at home and you resume or decide to hold them, it is a problem that needs to be her. Same for CAD, COPD, HLD and so on)  Acute left lower extremity DVT -With significant blood clot burden, vascular surgeon consulted, given the patient age, vascular surgeon recommended conservative management with heparin drip for now, hopefully can avoid any surgical intervention.  Family at bedside agreed with the plan. -Unprovoked, heparin drip for now, plan to switch to Eliquis on discharge -Other supportive care, limb elevation -Given that patient is on heparin drip, will D/C ASA -No symptoms or signs of PE and patient is on systemic anticoagulation, hold off CTA -Hold off PT evaluation for now.  HTN -Continue ramipril  IIDM -Glucose controlled  CKD stage IIIa -Euvolemic, creatinine level stable -With mild nongap metabolic acidosis, will start  bicarb p.o.  DVT prophylaxis: Heparin drip Code Status: DNR Family Communication: Daughter at bedside Disposition Plan: Patient is sick with acute DVT with magnificent blood clot burden, requiring inpatient vascular surgeon consultation and may need surgical intervention.  Expect more than 2 midnight hospital stay. Consults called: Vascular surgery Admission status: Tele admit   Lequita Halt MD [facility] Pager [phone]  06/28/2022, 5:33 PM

## 2022-06-28 NOTE — ED Notes (Signed)
ED TO INPATIENT HANDOFF REPORT  ED Nurse Name and Phone #: Adiva Boettner [phone]  S Name/Age/Gender [name] 89 y.o. female Room/Bed: 030C/030C  Code Status   Code Status: DNR  Home/SNF/Other Nursing Home Patient oriented to: self and place Is this baseline? Yes   Triage Complete: Triage complete  Chief Complaint DVT (deep vein thrombosis) in pregnancy [O22.30]  Triage Note Pt BIB [facility] from [facility] due to pitting edema in the left leg that started 06/26/22.  Pt did have xrays at the facility but they have not came back yet.  Daughter wanted her checked out by emergency. VS BP 130/86, Pulse 60, CBG 173, Resp 14   Allergies Allergies  Allergen Reactions   Codeine Nausea And Vomiting   Other     Other reaction(s): nausea   Statins Other (See Comments)    Pt does not remember the reaction    Level of Care/Admitting Diagnosis ED Disposition     ED Disposition  Admit   Condition  --   [place]: [place] [[id number]]  Level of Care: Telemetry Medical [104]  May admit patient to [facility] or [facility] if equivalent level of care is available:: No  Covid Evaluation: Asymptomatic - no recent exposure (last 10 days) testing not required  Diagnosis: DVT (deep vein thrombosis) in pregnancy [id number]  Admitting Physician: Lequita Halt [id number]  Attending Physician: Lequita Halt [id number]  Certification:: I certify this patient will need inpatient services for at least 2 midnights  Estimated Length of Stay: 2          B Medical/Surgery History Past Medical History:  Diagnosis Date   Acute pharyngitis    Acute sinusitis, unspecified    Acute upper respiratory infections of unspecified site    Allergy    Anal and rectal polyp    BCC (basal cell carcinoma) 05/18/2021   right nasal sidewall, radiaiton completed 07/29/21   Cataract    History of   Cavernous angioma    Degenerative cervical disc 02/22/2006   Diabetes  mellitus    Type II   GERD (gastroesophageal reflux disease)    Gout    Hemangioma of unspecified site    Hyperlipidemia    Hypertension    Orthostatic hypotension 04/24/2006   Hospital   Osteoarthritis    Osteopenia    Other abscess of vulva    Palpitations    SCC (squamous cell carcinoma) 05/18/2021   left dorsal hand over MCP, EDC 06/22/21   SCC (squamous cell carcinoma) 12/29/2021   right upper arm, ED&C 02/02/22   Squamous cell carcinoma in situ (SCCIS) 05/18/2021   right lower leg medial, EDC 06/22/21   Squamous cell carcinoma in situ (SCCIS) 12/29/2021   left superior shoulder, EDC 02/02/22   Syncope and collapse    1 episode   Past Surgical History:  Procedure Laterality Date   ABDOMINAL HYSTERECTOMY     Aortic dopplers     Mild plaque.  EEG okay   CATARACT EXTRACTION     Cavernous angioma     Cavernous hemangioma  04/2004   Hospital   TOTAL HIP ARTHROPLASTY Right 11/01/2020   Procedure: TOTAL HIP ARTHROPLASTY ANTERIOR APPROACH;  Surgeon: Gaynelle Arabian, MD;  Location: [facility];  Service: Orthopedics;  Laterality: Right;   TOTAL KNEE ARTHROPLASTY     Right     A IV Location/Drains/Wounds Patient Lines/Drains/Airways Status     Active Line/Drains/Airways     Name Placement  date Placement time Site Days   Peripheral IV 06/28/22 20 G Right Antecubital 06/28/22  1322  Antecubital  less than 1   External Urinary Catheter 06/28/22  1440  --  less than 1            Intake/Output Last 24 hours No intake or output data in the 24 hours ending 06/28/22 1630  Labs/Imaging Results for orders placed or performed during the hospital encounter of 06/28/22 (from the past 48 hour(s))  CBC with Differential     Status: Abnormal   Collection Time: 06/28/22  1:22 PM  Result Value Ref Range   WBC 12.5 (H) 4.0 - 10.5 K/uL   RBC 3.87 3.87 - 5.11 MIL/uL   Hemoglobin 12.2 12.0 - 15.0 g/dL   HCT 38.4 36.0 - 46.0 %   MCV 99.2 80.0 - 100.0 fL   MCH 31.5 26.0 - 34.0 pg   MCHC  31.8 30.0 - 36.0 g/dL   RDW 13.3 11.5 - 15.5 %   Platelets 278 150 - 400 K/uL   nRBC 0.0 0.0 - 0.2 %   Neutrophils Relative % 82 %   Neutro Abs 10.2 (H) 1.7 - 7.7 K/uL   Lymphocytes Relative 8 %   Lymphs Abs 1.0 0.7 - 4.0 K/uL   Monocytes Relative 7 %   Monocytes Absolute 0.9 0.1 - 1.0 K/uL   Eosinophils Relative 2 %   Eosinophils Absolute 0.2 0.0 - 0.5 K/uL   Basophils Relative 0 %   Basophils Absolute 0.0 0.0 - 0.1 K/uL   Immature Granulocytes 1 %   Abs Immature Granulocytes 0.09 (H) 0.00 - 0.07 K/uL    Comment: Performed at [facility], [address] [place], [place] [id number]  Basic metabolic panel     Status: Abnormal   Collection Time: 06/28/22  1:22 PM  Result Value Ref Range   Sodium 134 (L) 135 - 145 mmol/L   Potassium 4.6 3.5 - 5.1 mmol/L   Chloride 102 98 - 111 mmol/L   CO2 19 (L) 22 - 32 mmol/L   Glucose, Bld 105 (H) 70 - 99 mg/dL    Comment: Glucose reference range applies only to samples taken after fasting for at least 8 hours.   BUN 48 (H) 8 - 23 mg/dL   Creatinine, Ser 1.47 (H) 0.44 - 1.00 mg/dL   Calcium 9.2 8.9 - 10.3 mg/dL   GFR, Estimated 32 (L) >60 mL/min    Comment: (NOTE) Calculated using the CKD-EPI Creatinine Equation (2021)    Anion gap 13 5 - 15    Comment: Performed at [facility] [address], [place], [place] [address]   VAS [place] LOWER EXTREMITY VENOUS (DVT) (ONLY [facility] & [facility])  Result Date: 06/28/2022  Lower Venous DVT Study Patient Name:  [name] [facility]  Date of Exam:   06/28/2022 Medical Rec #: [id number]         Accession #:    [id number] Date of Birth: 11/15/1922         Patient Gender: F Patient Age:   84 years Exam Location:  [facility] Procedure:      VAS [place] LOWER EXTREMITY VENOUS (DVT) Referring Phys: Regan Lemming --------------------------------------------------------------------------------  Indications: Swelling, SOB, Pain, and multiple recent falls.  Performing Technologist: McKayla Maag RVT, VT  Examination  Guidelines: A complete evaluation includes B-mode imaging, spectral Doppler, color Doppler, and power Doppler as needed of all accessible portions of each vessel. Bilateral testing is considered  an integral part of a complete examination. Limited examinations for reoccurring indications may be performed as noted. The reflux portion of the exam is performed with the patient in reverse Trendelenburg.  +-----+---------------+---------+-----------+----------+--------------+ RIGHTCompressibilityPhasicitySpontaneityPropertiesThrombus Aging +-----+---------------+---------+-----------+----------+--------------+ CFV  Full           Yes      Yes                                 +-----+---------------+---------+-----------+----------+--------------+ SFJ  Full                                                        +-----+---------------+---------+-----------+----------+--------------+   +---------+---------------+---------+-----------+----------+-------------------+ LEFT     CompressibilityPhasicitySpontaneityPropertiesThrombus Aging      +---------+---------------+---------+-----------+----------+-------------------+ CFV      None           No       No                   Acute               +---------+---------------+---------+-----------+----------+-------------------+ SFJ      None           No       No                   Acute               +---------+---------------+---------+-----------+----------+-------------------+ FV Prox  None           No       No                   Acute               +---------+---------------+---------+-----------+----------+-------------------+ FV Mid   None           No       No                   Acute               +---------+---------------+---------+-----------+----------+-------------------+ FV DistalNone           No       No                   Acute                +---------+---------------+---------+-----------+----------+-------------------+ PFV      None           No       No                   Acute               +---------+---------------+---------+-----------+----------+-------------------+ POP      None           No       No                   Acute               +---------+---------------+---------+-----------+----------+-------------------+ PTV      None           No       No  Acute               +---------+---------------+---------+-----------+----------+-------------------+ PERO     None           No       No                   Acute               +---------+---------------+---------+-----------+----------+-------------------+ GSV      None           No       No                   Acute SVT junction                                                        to knee             +---------+---------------+---------+-----------+----------+-------------------+ EIV      None           No       No                   Acute               +---------+---------------+---------+-----------+----------+-------------------+ Unable to visualize IVC and iliac veins due to patient body habitus and not being NPO.    Summary: RIGHT: - No evidence of common femoral vein obstruction.  LEFT: - Findings consistent with acute deep vein thrombosis involving the external iliac vein, left common femoral vein, SF junction, left femoral vein, left proximal profunda vein, left popliteal vein, left posterior tibial veins, and left peroneal veins. - Findings consistent with acute superficial vein thrombosis involving the left great saphenous vein. - No cystic structure found in the popliteal fossa.  *See table(s) above for measurements and observations. Electronically signed by Jamelle Haring on 06/28/2022 at 4:12:09 PM.    Final     Pending Labs Unresulted Labs (From admission, onward)     Start     Ordered   06/29/22 0500  Heparin level  (unfractionated)  Daily,   R      06/28/22 1547   06/29/22 [id number]  Basic metabolic panel  Tomorrow morning,   R        06/28/22 1625   06/29/22 0500  CBC  Tomorrow morning,   R        06/28/22 1625   06/29/22 0000  Heparin level (unfractionated)  Once-Timed,   URGENT        06/28/22 1547            Vitals/Pain Today's Vitals   06/28/22 1317 06/28/22 1318 06/28/22 1615  BP: 129/73  135/64  Pulse: 86  80  Resp: (!) 22  14  Temp: (!) 97.5 F (36.4 C)    TempSrc: Oral    SpO2: 100%  97%  Weight:  60 kg   Height:  '5\' 3"'$  (1.6 m)   PainSc:  0-No pain     Isolation Precautions No active isolations  Medications Medications  heparin ADULT infusion 100 units/mL (25000 units/278m) (1,100 Units/hr Intravenous New Bag/Given 06/28/22 1555)  ramipril (ALTACE) capsule 5 mg (has no administration in time range)  mirabegron ER (MYRBETRIQ) tablet 25 mg (has no administration in time range)  fluticasone (  FLONASE) 50 MCG/ACT nasal spray 2 spray (has no administration in time range)  Loratadine CAPS 10 mg (has no administration in time range)  sodium chloride (OCEAN) 0.65 % nasal spray 1 spray (has no administration in time range)  Propylene Glycol 0.6 % SOLN 1-2 drop (has no administration in time range)  HYDROmorphone (DILAUDID) injection 0.5-1 mg (has no administration in time range)  acetaminophen (TYLENOL) tablet 650 mg (has no administration in time range)    Or  acetaminophen (TYLENOL) suppository 650 mg (has no administration in time range)  ondansetron (ZOFRAN) tablet 4 mg (has no administration in time range)    Or  ondansetron (ZOFRAN) injection 4 mg (has no administration in time range)    Mobility walks with device       R Recommendations: See Admitting Provider Note  Report given to:   Additional Notes:

## 2022-06-28 NOTE — Progress Notes (Signed)
ANTICOAGULATION CONSULT NOTE - Initial Consult  Pharmacy Consult for Heparin Indication: DVT  Allergies  Allergen Reactions   Codeine Nausea And Vomiting   Other     Other reaction(s): nausea   Statins Other (See Comments)    Pt does not remember the reaction    Patient Measurements: Height: '5\' 3"'$  (160 cm) Weight: 60 kg (132 lb 4.4 oz) IBW/kg (Calculated) : 52.4 Heparin Dosing Weight: 60kg  Vital Signs: Temp: 97.5 F (36.4 C) (03/06 1317) Temp Source: Oral (03/06 1317) BP: 129/73 (03/06 1317) Pulse Rate: 86 (03/06 1317)  Labs: Recent Labs    06/28/22 1322  HGB 12.2  HCT 38.4  PLT 278  CREATININE 1.47*    Estimated Creatinine Clearance: 18.1 mL/min (A) (by C-G formula based on SCr of 1.47 mg/dL (H)).   Medical History: Past Medical History:  Diagnosis Date   Acute pharyngitis    Acute sinusitis, unspecified    Acute upper respiratory infections of unspecified site    Allergy    Anal and rectal polyp    BCC (basal cell carcinoma) 05/18/2021   right nasal sidewall, radiaiton completed 07/29/21   Cataract    History of   Cavernous angioma    Degenerative cervical disc 02/22/2006   Diabetes mellitus    Type II   GERD (gastroesophageal reflux disease)    Gout    Hemangioma of unspecified site    Hyperlipidemia    Hypertension    Orthostatic hypotension 04/24/2006   Hospital   Osteoarthritis    Osteopenia    Other abscess of vulva    Palpitations    SCC (squamous cell carcinoma) 05/18/2021   left dorsal hand over MCP, EDC 06/22/21   SCC (squamous cell carcinoma) 12/29/2021   right upper arm, ED&C 02/02/22   Squamous cell carcinoma in situ (SCCIS) 05/18/2021   right lower leg medial, EDC 06/22/21   Squamous cell carcinoma in situ (SCCIS) 12/29/2021   left superior shoulder, EDC 02/02/22   Syncope and collapse    1 episode    Medications:  (Not in a hospital admission)  Scheduled:  Infusions:  PRN:   Assessment: 44 yof with a history of HLD,  HTN, DM 2, GERD . Patient is presenting with several days of left leg swelling. Heparin per pharmacy consult placed for DVT.  US imaging concerning for extensive DVT in the left leg. Patient is not on anticoagulation prior to arrival.  Hgb 12.2; plt 278  Goal of Therapy:  Heparin level 0.3-0.7 units/ml Monitor platelets by anticoagulation protocol: Yes   Plan:  No initial heparin bolus Start heparin infusion at 1100 units/hr Check anti-Xa level in 8 hours and daily while on heparin Continue to monitor H&H and platelets  Lorelei Pont, PharmD, BCPS 06/28/2022 3:42 PM ED Clinical Pharmacist -  [phone]

## 2022-06-28 NOTE — Progress Notes (Signed)
Left lower extremity venous study completed.   Preliminary results relayed to RN.  Attempted IVC and iliac scan, but due to patient not being NPO and patient body habitus unable to visualize vessels.  Please see CV Procedures for preliminary results.  Ajeet Casasola, RVT  4:06 PM 06/28/22

## 2022-06-28 NOTE — ED Triage Notes (Signed)
Pt BIB [facility] from [facility] due to pitting edema in the left leg that started 06/26/22.  Pt did have xrays at the facility but they have not came back yet.  Daughter wanted her checked out by emergency. VS BP 130/86, Pulse 60, CBG 173, Resp 14

## 2022-06-29 ENCOUNTER — Other Ambulatory Visit (HOSPITAL_COMMUNITY): Payer: Self-pay

## 2022-06-29 LAB — BASIC METABOLIC PANEL
Anion gap: 8 (ref 5–15)
BUN: 40 mg/dL — ABNORMAL HIGH (ref 8–23)
CO2: 23 mmol/L (ref 22–32)
Calcium: 9.1 mg/dL (ref 8.9–10.3)
Chloride: 105 mmol/L (ref 98–111)
Creatinine, Ser: 1.28 mg/dL — ABNORMAL HIGH (ref 0.44–1.00)
GFR, Estimated: 38 mL/min — ABNORMAL LOW (ref >60)
Glucose, Bld: 109 mg/dL — ABNORMAL HIGH (ref 70–99)
Potassium: 4.5 mmol/L (ref 3.5–5.1)
Sodium: 136 mmol/L (ref 135–145)

## 2022-06-29 LAB — CBC
HCT: 34.9 % — ABNORMAL LOW (ref 36.0–46.0)
Hemoglobin: 11.8 g/dL — ABNORMAL LOW (ref 12.0–15.0)
MCH: 32.2 pg (ref 26.0–34.0)
MCHC: 33.8 g/dL (ref 30.0–36.0)
MCV: 95.4 fL (ref 80.0–100.0)
Platelets: 264 10*3/uL (ref 150–400)
RBC: 3.66 MIL/uL — ABNORMAL LOW (ref 3.87–5.11)
RDW: 13.2 % (ref 11.5–15.5)
WBC: 9.6 10*3/uL (ref 4.0–10.5)
nRBC: 0 % (ref 0.0–0.2)

## 2022-06-29 LAB — HEPARIN LEVEL (UNFRACTIONATED)
Heparin Unfractionated: 0.55 IU/mL (ref 0.30–0.70)
Heparin Unfractionated: 0.67 IU/mL (ref 0.30–0.70)

## 2022-06-29 NOTE — Progress Notes (Signed)
ANTICOAGULATION CONSULT NOTE   Pharmacy Consult for Heparin Indication: DVT  Allergies  Allergen Reactions   Codeine Nausea And Vomiting   Other     Other reaction(s): nausea   Statins Other (See Comments)    Pt does not remember the reaction    Patient Measurements: Height: '5\' 3"'$  (160 cm) Weight: 60 kg (132 lb 4.4 oz) IBW/kg (Calculated) : 52.4 Heparin Dosing Weight: 60kg  Vital Signs: Temp: 97.8 F (36.6 C) (03/07 0459) Temp Source: Oral (03/06 2014) BP: 153/76 (03/07 0459) Pulse Rate: 74 (03/07 0459)  Labs: Recent Labs    06/28/22 1322 06/29/22 0415  HGB 12.2 11.8*  HCT 38.4 34.9*  PLT 278 264  HEPARINUNFRC  --  0.55  CREATININE 1.47* 1.28*     Estimated Creatinine Clearance: 20.8 mL/min (A) (by C-G formula based on SCr of 1.28 mg/dL (H)).   Medical History: Past Medical History:  Diagnosis Date   Acute pharyngitis    Acute sinusitis, unspecified    Acute upper respiratory infections of unspecified site    Allergy    Anal and rectal polyp    BCC (basal cell carcinoma) 05/18/2021   right nasal sidewall, radiaiton completed 07/29/21   Cataract    History of   Cavernous angioma    Degenerative cervical disc 02/22/2006   Diabetes mellitus    Type II   GERD (gastroesophageal reflux disease)    Gout    Hemangioma of unspecified site    Hyperlipidemia    Hypertension    Orthostatic hypotension 04/24/2006   Hospital   Osteoarthritis    Osteopenia    Other abscess of vulva    Palpitations    SCC (squamous cell carcinoma) 05/18/2021   left dorsal hand over MCP, EDC 06/22/21   SCC (squamous cell carcinoma) 12/29/2021   right upper arm, ED&C 02/02/22   Squamous cell carcinoma in situ (SCCIS) 05/18/2021   right lower leg medial, EDC 06/22/21   Squamous cell carcinoma in situ (SCCIS) 12/29/2021   left superior shoulder, EDC 02/02/22   Syncope and collapse    1 episode    Medications:  Medications Prior to Admission  Medication Sig Dispense Refill  Last Dose   Accu-Chek Softclix Lancets lancets CHECK BLOOD SUGAR 1 TIME A DAY. DX: E11.9 100 each 3    aspirin EC 81 MG tablet Take 81 mg by mouth daily. Swallow whole.      azelastine (ASTELIN) 0.1 % nasal spray Place 1 spray into both nostrils 2 (two) times daily. Use in each nostril as directed 30 mL 0    Blood Glucose Monitoring Suppl (ACCU-CHEK GUIDE) w/Device KIT 1 each by Does not apply route daily. Use to check blood sugar daily as needed.  Dx: E11.9 1 kit 0    fluticasone (FLONASE) 50 MCG/ACT nasal spray PLACE 2 SPRAYS INTO BOTH NOSTRILS DAILY AS NEEDED FOR ALLERGIES. 48 mL 1    glucose blood (ACCU-CHEK GUIDE) test strip Use to check blood sugar 1 time daily 100 each 3    Loratadine 10 MG CAPS Take 1 capsule by mouth daily.      Multiple Vitamin (MULTIVITAMIN PO) Take 1 tablet by mouth daily.      MYRBETRIQ 25 MG TB24 tablet Take 25 mg by mouth daily.      Propylene Glycol (SYSTANE BALANCE) 0.6 % SOLN Place 1-2 drops into both eyes daily.      ramipril (ALTACE) 5 MG capsule TAKE 1 CAPSULE BY MOUTH EVERY DAY 90 capsule 1  SALINE NASAL SPRAY NA Place 2 sprays into the nose every 6 (six) hours as needed (nasal stuffiness).       Scheduled:  Infusions:  PRN:   Assessment: 89 yof with a history of HLD, HTN, DM 2, GERD . Patient is presenting with several days of left leg swelling. Heparin per pharmacy consult placed for DVT.  US imaging concerning for extensive DVT in the left leg. Patient is not on anticoagulation prior to arrival.  Hgb 12.2; plt 278  3/7 AM update:   Heparin level therapeutic   Goal of Therapy:  Heparin level 0.3-0.7 units/ml Monitor platelets by anticoagulation protocol: Yes   Plan:  Cont heparin 1100 units/hr 1200 heparin level  Narda Bonds, PharmD, BCPS Clinical Pharmacist Phone: [phone]

## 2022-06-29 NOTE — TOC Benefit Eligibility Note (Signed)
Patient Teacher, English as a foreign language completed.    The patient is currently admitted and upon discharge could be taking Eliquis 5 mg.  The current 30 day co-pay is $93.53.   The patient is insured through [place], [place] Patient Advocate Specialist [place] Patient Advocate Team Direct Number: [phone]  Fax: [phone]

## 2022-06-29 NOTE — Progress Notes (Signed)
PROGRESS NOTE  [name]  DOB: 04-20-1923  PCP: Josetta Huddle, MD [id number]  DOA: 06/28/2022  LOS: 1 day  Hospital Day: 2  Brief narrative: [name] is a 89 y.o. female with PMH significant for DM2, HTN, HLD CKD, chronic pancreatic insufficiency, GERD, gout who lives at [facility] facility. 3/6, patient presented to the ED with worsening of left leg swelling and pain for 2 days. No previous history of DVT or PE.  In the ED, patient was afebrile, hemodynamically stable, maintaining O2 sat on room air Ultrasound duplex left lower extremity was positive for extensive DVT involving the left external iliac vein, left common femoral vein, SFAs on exam, left femoral vein, left proximal profunda vein, left popliteal vein, left posterior tibial veins and left peroneal veins. Given the significant extent of clot burden, vascular surgery was consulted.  Based on her age and comorbidities, surgical intervention was not recommended.  Conservative management with anticoagulation was suggested. Patient was started on heparin drip. Ad[facility] to [facility]  Subjective: Patient was seen and examined this morning.  Pleasant elderly Caucasian female.  Propped up in bed.  Not in distress.  Daughter at bedside. Chart reviewed Overnight remains hemodynamically stable  Assessment and plan: Acute extensive DVT of LLE Unprovoked.   Extensive clot extent as above.   For vascular surgery recommendation, patient was started on heparin drip.  I will continue it for at least 48 hours before switching to Eliquis  Aspirin has been stopped Swelling seems to be improving.  Continue to keep limb elevated  No evidence of PE and hence CTA was not obtained.  Type 2 diabetes mellitus A1c 6 in 2022 Diet controlled.  Glucose level in blood works remain consistently controlled.  Essential hypertension Continue ramipril  CKD stage IIIa Euvolemic, creatinine level stable  Impaired  mobility/falls Per daughter, patient is having more frequent falls lately.  She uses rollator to move around and has a wheelchair as well but prefers not to use it. We have discussed about the risk of life-threatening/fatal fall while on anticoagulation. Will repeat eval tomorrow.   Mobility:   Goals of care   Code Status: DNR     DVT prophylaxis: Heparin drip    Antimicrobials: None Fluid: None Consultants: None Family Communication: Daughter at bedside  Status is: Inpatient Level of care: Telemetry Medical   Dispo: Patient is from: Assisted living facility              Anticipated d/c is to: Pending clinical course Continue in-hospital care because: On heparin drip   Scheduled Meds:  loratadine  10 mg Oral Daily   mirabegron ER  25 mg Oral Daily   polyvinyl alcohol  1-2 drop Both Eyes Daily   ramipril  5 mg Oral Daily    PRN meds: acetaminophen **OR** acetaminophen, fluticasone, HYDROmorphone (DILAUDID) injection, ondansetron **OR** ondansetron (ZOFRAN) IV, sodium chloride   Infusions:   heparin 1,100 Units/hr (06/28/22 1555)    Diet:  Diet Order             Diet Heart Room service appropriate? Yes; Fluid consistency: Thin  Diet effective now                   Antimicrobials: Anti-infectives (From admission, onward)    Start     Dose/Rate Route Frequency Ordered Stop   06/28/22 1530  ceFAZolin (ANCEF) IVPB 1 g/50 mL premix  Status:  Discontinued        1 g  100 mL/hr over 30 Minutes Intravenous  Once 06/28/22 1519 06/28/22 1526       Skin assessment:       Nutritional status:  Body mass index is 23.43 kg/m.          Objective: Vitals:   06/29/22 0459 06/29/22 0805  BP: (!) 153/76 (!) 134/56  Pulse: 74 92  Resp: 18 19  Temp: 97.8 F (36.6 C) 97.8 F (36.6 C)  SpO2: 100%     Intake/Output Summary (Last 24 hours) at 06/29/2022 0848 Last data filed at 06/29/2022 [id number] Gross per 24 hour  Intake 22.81 ml  Output 600 ml  Net  -577.19 ml   Filed Weights   06/28/22 1318  Weight: 60 kg   Weight change:  Body mass index is 23.43 kg/m.   Physical Exam: General exam: Pleasant, elderly Caucasian female.  Not in pain Skin: No rashes, lesions or ulcers. HEENT: Atraumatic, normocephalic, no obvious bleeding Lungs: Clear to auscultation bilaterally CVS: Regular rate and rhythm, no murmur GI/Abd soft, nontender, nondistended, bowel sound present CNS: Alert, awake, oriented x 3, hard of hearing Psychiatry: Mood appropriate Extremities: Left lower extremity pedal edema 1+.  Calf tenderness present.  Data Review: I have personally reviewed the laboratory data and studies available.  F/u labs ordered Unresulted Labs (From admission, onward)     Start     Ordered   06/29/22 1200  Heparin level (unfractionated)  Once-Timed,   TIMED        06/29/22 0503   06/29/22 0500  Heparin level (unfractionated)  Daily,   R      06/28/22 2231            Total time spent in review of labs and imaging, patient evaluation, formulation of plan, documentation and communication with family: 26 minutes  Signed, Terrilee Croak, MD [facility] 06/29/2022

## 2022-06-29 NOTE — Progress Notes (Signed)
ANTICOAGULATION CONSULT NOTE   Pharmacy Consult for Heparin Indication: DVT  Allergies  Allergen Reactions   Codeine Nausea And Vomiting   Statins Other (See Comments)    Pt does not remember the reaction    Patient Measurements: Height: '5\' 3"'$  (160 cm) Weight: 60 kg (132 lb 4.4 oz) IBW/kg (Calculated) : 52.4 Heparin Dosing Weight: 60kg  Vital Signs: Temp: 97.8 F (36.6 C) (03/07 0805) Temp Source: Oral (03/07 0805) BP: 134/56 (03/07 0805) Pulse Rate: 92 (03/07 0805)  Labs: Recent Labs    06/28/22 1322 06/29/22 0415 06/29/22 1205  HGB 12.2 11.8*  --   HCT 38.4 34.9*  --   PLT 278 264  --   HEPARINUNFRC  --  0.55 0.67  CREATININE 1.47* 1.28*  --      Estimated Creatinine Clearance: 20.8 mL/min (A) (by C-G formula based on SCr of 1.28 mg/dL (H)).   Medical History: Past Medical History:  Diagnosis Date   Acute pharyngitis    Acute sinusitis, unspecified    Acute upper respiratory infections of unspecified site    Allergy    Anal and rectal polyp    BCC (basal cell carcinoma) 05/18/2021   right nasal sidewall, radiaiton completed 07/29/21   Cataract    History of   Cavernous angioma    Degenerative cervical disc 02/22/2006   Diabetes mellitus    Type II   GERD (gastroesophageal reflux disease)    Gout    Hemangioma of unspecified site    Hyperlipidemia    Hypertension    Orthostatic hypotension 04/24/2006   Hospital   Osteoarthritis    Osteopenia    Other abscess of vulva    Palpitations    SCC (squamous cell carcinoma) 05/18/2021   left dorsal hand over MCP, EDC 06/22/21   SCC (squamous cell carcinoma) 12/29/2021   right upper arm, ED&C 02/02/22   Squamous cell carcinoma in situ (SCCIS) 05/18/2021   right lower leg medial, EDC 06/22/21   Squamous cell carcinoma in situ (SCCIS) 12/29/2021   left superior shoulder, EDC 02/02/22   Syncope and collapse    1 episode    Medications:  Medications Prior to Admission  Medication Sig Dispense Refill  Last Dose   Accu-Chek Softclix Lancets lancets CHECK BLOOD SUGAR 1 TIME A DAY. DX: E11.9 100 each 3    aspirin EC 81 MG tablet Take 81 mg by mouth daily. Swallow whole.      azelastine (ASTELIN) 0.1 % nasal spray Place 1 spray into both nostrils 2 (two) times daily. Use in each nostril as directed 30 mL 0    Blood Glucose Monitoring Suppl (ACCU-CHEK GUIDE) w/Device KIT 1 each by Does not apply route daily. Use to check blood sugar daily as needed.  Dx: E11.9 1 kit 0    fluticasone (FLONASE) 50 MCG/ACT nasal spray PLACE 2 SPRAYS INTO BOTH NOSTRILS DAILY AS NEEDED FOR ALLERGIES. 48 mL 1    glucose blood (ACCU-CHEK GUIDE) test strip Use to check blood sugar 1 time daily 100 each 3    Loratadine 10 MG CAPS Take 1 capsule by mouth daily.      Multiple Vitamin (MULTIVITAMIN PO) Take 1 tablet by mouth daily.      MYRBETRIQ 25 MG TB24 tablet Take 25 mg by mouth daily.      Propylene Glycol (SYSTANE BALANCE) 0.6 % SOLN Place 1-2 drops into both eyes daily.      ramipril (ALTACE) 5 MG capsule TAKE 1 CAPSULE BY MOUTH  EVERY DAY 90 capsule 1    SALINE NASAL SPRAY NA Place 2 sprays into the nose every 6 (six) hours as needed (nasal stuffiness).       Scheduled:  Infusions:  PRN:   Assessment: 40 yof with a history of HLD, HTN, DM 2, GERD . Patient is presenting with several days of left leg swelling. Heparin per pharmacy consult placed for DVT.  US imaging concerning for extensive DVT in the left leg. Patient is not on anticoagulation prior to arrival.  3/7 PM update:   Heparin level therapeutic. CBC stable. No bleeding or issues with infusion noted per d/w RN.  Goal of Therapy:  Heparin level 0.3-0.7 units/ml Monitor platelets by anticoagulation protocol: Yes   Plan:  Cont heparin 1100 units/hr Daily heparin level, CBC F/u switch to Eliquis  Dimple Nanas, PharmD, BCPS 06/29/2022 1:07 PM

## 2022-06-30 LAB — CBC
HCT: 35.1 % — ABNORMAL LOW (ref 36.0–46.0)
Hemoglobin: 11.9 g/dL — ABNORMAL LOW (ref 12.0–15.0)
MCH: 32.3 pg (ref 26.0–34.0)
MCHC: 33.9 g/dL (ref 30.0–36.0)
MCV: 95.4 fL (ref 80.0–100.0)
Platelets: 283 10*3/uL (ref 150–400)
RBC: 3.68 MIL/uL — ABNORMAL LOW (ref 3.87–5.11)
RDW: 13.4 % (ref 11.5–15.5)
WBC: 15.5 10*3/uL — ABNORMAL HIGH (ref 4.0–10.5)
nRBC: 0 % (ref 0.0–0.2)

## 2022-06-30 LAB — HEPARIN LEVEL (UNFRACTIONATED): Heparin Unfractionated: 0.75 IU/mL — ABNORMAL HIGH (ref 0.30–0.70)

## 2022-06-30 MED ORDER — APIXABAN 5 MG PO TABS: 5.0000 mg | ORAL_TABLET | Freq: Two times a day (BID) | ORAL | Status: DC

## 2022-06-30 MED ORDER — APIXABAN 5 MG PO TABS
10.0000 mg | ORAL_TABLET | Freq: Two times a day (BID) | ORAL | Status: DC
  Administered 2022-06-30 – 2022-07-03 (×7): 10 mg via ORAL
  Filled 2022-06-30 (×7): qty 2

## 2022-06-30 NOTE — NC FL2 (Signed)
[place] LEVEL OF CARE FORM     IDENTIFICATION  Patient Name: [name] Birthdate: May 25, 1922 Sex: female Admission Date (Current Location): 06/28/2022  [facility] and [place] Number:  Herbalist and Address:  [facility] [place], [place] [address] [place], [place] [address]      Provider Number: [id number]  Attending Physician Name and Address:  Terrilee Croak, MD  Relative Name and Phone Number:  Elliott,Margaret Daughter [phone]  [phone]    Current Level of Care: Hospital Recommended Level of Care: [place] Prior Approval Number:    Date Approved/Denied:   PASRR Number: [id number]  Discharge Plan: SNF    Current Diagnoses: Patient Active Problem List   Diagnosis Date Noted   DVT (deep venous thrombosis) ([place]) 06/28/2022   DVT (deep vein thrombosis) in pregnancy 06/28/2022   Basal cell carcinoma of nose 06/14/2021   Tinea pedis of both feet 12/06/2020   Hip fracture ([place]) 11/01/2020   Closed displaced fracture of right femoral neck ([place]) 10/29/2020   Discharge of eye, left 10/29/2020   Elevated serum creatinine 10/29/2020   Sprain of anterior talofibular ligament of left ankle 06/22/2020   Acute cystitis without hematuria 05/10/2020   History of colonic polyps 04/28/2020   Urinary frequency 12/16/2019   Cough, persistent 11/06/2019   Acute neck pain 09/26/2019   Seborrheic dermatitis of scalp 09/26/2019   Nocturia 08/22/2019   Leukocytosis 06/13/2018   Seborrheic keratoses 10/16/2017   Hyponatremia 09/14/2017   Chronic left upper quadrant pain 08/07/2017   Right hip pain 07/12/2017   Hearing loss 02/20/2017   TIA (transient ischemic attack), possible 11/14/2016   Left carpal tunnel syndrome 08/11/2016   Fatigue 06/15/2016   Constipation, chronic 03/21/2016   Near syncope 05/07/2015   Osteoarthritis of right shoulder 05/04/2015   Counseling regarding end of life decision making 01/15/2015    Skin lesion of right leg 07/10/2014   Insomnia 04/10/2014   Shakiness 12/16/2013   Osteoarthritis of right knee 10/14/2013   Osteoarthritis of left knee 10/14/2013   Infected sebaceous cyst 11/29/2012   Gout, chronic, without tophus 12/13/2009   Osteoporosis 10/29/2009   PALPITATIONS, OCCASIONAL 07/07/2008   Allergic rhinitis 06/02/2008   HEMANGIOMA 07/30/2006   Controlled type 2 diabetes mellitus without complication, without long-term current use of insulin ([place]) 07/30/2006   Hyperlipidemia LDL goal <100 07/30/2006   Essential hypertension, benign 07/30/2006   RECTAL POLYPS 07/30/2006   Osteoarthritis 07/30/2006   CATARACT, HX OF 07/30/2006    Orientation RESPIRATION BLADDER Height & Weight     Self, Situation, Place  Normal Incontinent, External catheter Weight: 132 lb 4.4 oz (60 kg) Height:  '5\' 3"'$  (160 cm)  BEHAVIORAL SYMPTOMS/MOOD NEUROLOGICAL BOWEL NUTRITION STATUS      Continent Diet (see discharge summary)  AMBULATORY STATUS COMMUNICATION OF NEEDS Skin   Limited Assist Verbally Other (Comment) (redness, ecchymosis)                       Personal Care Assistance Level of Assistance  Bathing, Feeding, Dressing Bathing Assistance: Limited assistance Feeding assistance: Limited assistance Dressing Assistance: Limited assistance     Functional Limitations Info  Sight, Hearing, Speech Sight Info: Adequate Hearing Info: Impaired Speech Info: Adequate    SPECIAL CARE FACTORS FREQUENCY  PT (By licensed PT), OT (By licensed OT)     PT Frequency: 5x week OT Frequency: 5x week  Contractures Contractures Info: Not present    Additional Factors Info  Code Status, Allergies Code Status Info: DNR Allergies Info: Codeine, Statins           Current Medications (06/30/2022):  This is the current hospital active medication list Current Facility-Administered Medications  Medication Dose Route Frequency Provider Last Rate Last Admin    acetaminophen (TYLENOL) tablet 650 mg  650 mg Oral Q6H PRN Wynetta Fines T, MD   650 mg at 06/30/22 [id number]   Or   acetaminophen (TYLENOL) suppository 650 mg  650 mg Rectal Q6H PRN Lequita Halt, MD       apixaban Arne [place]) tablet 10 mg  10 mg Oral BID Dimple Nanas, RPH   10 mg at 06/30/22 [id number]   Followed by   Derrill Memo ON 07/07/2022] apixaban (ELIQUIS) tablet 5 mg  5 mg Oral BID Dimple Nanas, RPH       fluticasone (FLONASE) 50 MCG/ACT nasal spray 2 spray  2 spray Each Nare Daily PRN Wynetta Fines T, MD       HYDROmorphone (DILAUDID) injection 0.5-1 mg  0.5-1 mg Intravenous Q2H PRN Wynetta Fines T, MD       loratadine (CLARITIN) tablet 10 mg  10 mg Oral Daily Wynetta Fines T, MD   10 mg at 06/30/22 [id number]   mirabegron ER (MYRBETRIQ) tablet 25 mg  25 mg Oral Daily Wynetta Fines T, MD   25 mg at 06/30/22 0823   ondansetron (ZOFRAN) tablet 4 mg  4 mg Oral Q6H PRN Lequita Halt, MD       Or   ondansetron [facility]) injection 4 mg  4 mg Intravenous Q6H PRN Wynetta Fines T, MD       polyvinyl alcohol (LIQUIFILM TEARS) 1.4 % ophthalmic solution 1-2 drop  1-2 drop Both Eyes Daily Wynetta Fines T, MD   1 drop at 06/30/22 0825   ramipril (ALTACE) capsule 5 mg  5 mg Oral Daily Wynetta Fines T, MD   5 mg at 06/30/22 [id number]   sodium chloride (OCEAN) 0.65 % nasal spray 1 spray  1 spray Nasal Q6H PRN Lequita Halt, MD         Discharge Medications: Please see discharge summary for a list of discharge medications.  Relevant Imaging Results:  Relevant Lab Results:   Additional Information SSN: [id number].  Pt is vaccinated for covid with boosters.  [name], LCSW

## 2022-06-30 NOTE — Progress Notes (Signed)
Patient complains of vaginal soreness and pain. Purwieke causing irritation and redness. Discontinued purwieke and applied barrier cream. Labia red irritated, sore.

## 2022-06-30 NOTE — Evaluation (Signed)
Physical Therapy Evaluation Patient Details Name: [name] MRN: [id number] DOB: 1922/09/02 Today's Date: 06/30/2022  History of Present Illness  89 y.o. female admitted 3/6 after presenting with worsening of left leg swelling and pain; found to have acute extensive DVT of LLE. PMH significant for DM2, HTN, HLD CKD, chronic pancreatic insufficiency, GERD, gout.  Clinical Impression  Pt admitted with above diagnosis. History provided by daughter today, reports pt usually oriented at baseline however is only oriented to self during evaluation today. Pt able to ambulate with rollator to dining hall at ALF, needs pushed in w/c on days she is fatigued, and needs help with bathing. Currently pt requires mod assist for bed mobility and transfers; limited WB tolerance on LLE. Would greatly benefit for short term SNF to improve safety and independence prior to returning to ALF. Pt currently with functional limitations due to the deficits listed below (see PT Problem List). Pt will benefit from skilled PT to increase their independence and safety with mobility to allow discharge to the venue listed below.          Recommendations for follow up therapy are one component of a multi-disciplinary discharge planning process, led by the attending physician.  Recommendations may be updated based on patient status, additional functional criteria and insurance authorization.  Follow Up Recommendations Skilled nursing-short term rehab (<3 hours/day) Can patient physically be transported by private vehicle: No (but soon)    Assistance Recommended at Discharge Frequent or constant Supervision/Assistance  Patient can return home with the following  A lot of help with walking and/or transfers;A lot of help with bathing/dressing/bathroom;Assistance with cooking/housework;Direct supervision/assist for medications management;Direct supervision/assist for financial management;Assist for transportation    Equipment  Recommendations None recommended by PT  Recommendations for Other Services       Functional Status Assessment Patient has had a recent decline in their functional status and demonstrates the ability to make significant improvements in function in a reasonable and predictable amount of time.     Precautions / Restrictions Precautions Precautions: Fall Restrictions Weight Bearing Restrictions: No      Mobility  Bed Mobility Overal bed mobility: Needs Assistance Bed Mobility: Supine to Sit     Supine to sit: Mod assist, HOB elevated     General bed mobility comments: Mod assist to facilitate LEs, and assist with trunk to rise to EOB. Fearful of falling once seated but improved with duration.    Transfers Overall transfer level: Needs assistance Equipment used: Rolling walker (2 wheels) Transfers: Sit to/from Stand, Bed to chair/wheelchair/BSC Sit to Stand: Mod assist Stand pivot transfers: Mod assist         General transfer comment: Mod assist for boost, practiced x2 from EOB, with limited WB tolerance through LLE. Adjusted RW for improved leverage and able to very slowly pivot to recliner. Again with limited WB through LLE, making difficult to lift RLE to step. Max VC and constant facilitatory techniques to pivot to recliner. Anxious of falling.    Ambulation/Gait               General Gait Details: Unable at this time.  Stairs            Wheelchair Mobility    Modified Rankin (Stroke Patients Only)       Balance Overall balance assessment: Needs assistance Sitting-balance support: Feet supported, No upper extremity supported Sitting balance-Leahy Scale: Fair Sitting balance - Comments: Progressed to fair with light resistance. Seated balance activites having pt reach  LOS and push therapist in various directions to facilitate midline awareness. Postural control: Posterior lean Standing balance support: Bilateral upper extremity supported, Reliant on  assistive device for balance Standing balance-Leahy Scale: Poor                               Pertinent Vitals/Pain Pain Assessment Pain Assessment: Faces Faces Pain Scale: Hurts little more Pain Location: Lt leg Pain Descriptors / Indicators: Aching Pain Intervention(s): Monitored during session, Repositioned, Limited activity within patient's tolerance    Home Living Family/patient expects to be discharged to:: Skilled nursing facility Living Arrangements: Other (Comment) (ALF) Available Help at Discharge: Available 24 hours/day Type of Home: Assisted living [facility]) Home Access: Level entry       Home Layout: One level Home Equipment: Shower seat - built in;Rollator (4 wheels);Wheelchair - Publishing copy (2 wheels)      Prior Function Prior Level of Function : Needs assist;History of Falls (last six months)             Mobility Comments: Uses rollator in room, occasionally uses rollator to walk to dining room. Will also use w/c if fatigued but cannot propel herself. Dtr reports decline in function over the past month. Hx of knees buckling. ADLs Comments: Staff assist with bathing, sometimes able to dress herself, has required more assist over the past month.     Hand Dominance   Dominant Hand: Left    Extremity/Trunk Assessment   Upper Extremity Assessment Upper Extremity Assessment: Defer to OT evaluation    Lower Extremity Assessment Lower Extremity Assessment: Generalized weakness       Communication   Communication: HOH  Cognition Arousal/Alertness: Lethargic Behavior During Therapy: WFL for tasks assessed/performed Overall Cognitive Status: Impaired/Different from baseline Area of Impairment: Orientation, Memory, Following commands, Problem solving                 Orientation Level: Disoriented to, Place, Time, Situation   Memory: Decreased recall of precautions, Decreased short-term memory Following Commands: Follows  one step commands inconsistently, Follows one step commands with increased time     Problem Solving: Slow processing, Decreased initiation, Difficulty sequencing, Requires verbal cues, Requires tactile cues General Comments: Daughter reports pt typically oriented. Notices pt having some difficulty with word finding.        General Comments General comments (skin integrity, edema, etc.): VSS throughout session. Daughter present and supportive.    Exercises     Assessment/Plan    PT Assessment Patient needs continued PT services  PT Problem List Decreased strength;Decreased range of motion;Decreased activity tolerance;Decreased balance;Decreased mobility;Decreased cognition;Decreased knowledge of use of DME;Decreased safety awareness;Decreased knowledge of precautions;Pain       PT Treatment Interventions DME instruction;Gait training;Functional mobility training;Therapeutic activities;Therapeutic exercise;Balance training;Neuromuscular re-education;Cognitive remediation;Patient/family education;Modalities    PT Goals (Current goals can be found in the Care Plan section)  Acute Rehab PT Goals Patient Stated Goal: none stated PT Goal Formulation: With patient/family Time For Goal Achievement: 07/14/22 Potential to Achieve Goals: Good    Frequency Min 3X/week     Co-evaluation               AM-PAC PT "6 Clicks" Mobility  Outcome Measure Help needed turning from your back to your side while in a flat bed without using bedrails?: A Lot Help needed moving from lying on your back to sitting on the side of a flat bed without using bedrails?: A Lot Help  needed moving to and from a bed to a chair (including a wheelchair)?: A Lot Help needed standing up from a chair using your arms (e.g., wheelchair or bedside chair)?: A Lot Help needed to walk in hospital room?: Total Help needed climbing 3-5 steps with a railing? : Total 6 Click Score: 10    End of Session Equipment  Utilized During Treatment: Gait belt Activity Tolerance: Patient tolerated treatment well Patient left: in chair;with call bell/phone within reach;with chair alarm set;with family/visitor present Nurse Communication:  (NT - +2 with RW, or stedy for transfers.) PT Visit Diagnosis: Unsteadiness on feet (R26.81);Other abnormalities of gait and mobility (R26.89);Muscle weakness (generalized) (M62.81);Repeated falls (R29.6);History of falling (Z91.81);Difficulty in walking, not elsewhere classified (R26.2);Pain Pain - Right/Left: Left Pain - part of body: Leg    Time: 1124-1200 PT Time Calculation (min) (ACUTE ONLY): 36 min   Charges:   PT Evaluation $PT Eval Low Complexity: 1 Low PT Treatments $Therapeutic Activity: 8-22 mins        Candie Mile, PT, DPT Physical Therapist Acute Rehabilitation Services [place]   Ellouise Newer 06/30/2022, 1:18 PM

## 2022-06-30 NOTE — Care Management Important Message (Signed)
Important Message  Patient Details  Name: [name] MRN: [id number] Date of Birth: 1922-12-20   Medicare Important Message Given:  Yes     Hannah Beat 06/30/2022, 12:22 PM

## 2022-06-30 NOTE — Discharge Instructions (Signed)
Information on my medicine - ELIQUIS (apixaban)  Why was Eliquis prescribed for you? Eliquis was prescribed to treat blood clots that may have been found in the veins of your legs (deep vein thrombosis) or in your lungs (pulmonary embolism) and to reduce the risk of them occurring again.  What do You need to know about Eliquis ? The starting dose is 10 mg (two 5 mg tablets) taken TWICE daily for the FIRST SEVEN (7) DAYS, then on (enter date)  07/07/22  the dose is reduced to ONE 5 mg tablet taken TWICE daily.  Eliquis may be taken with or without food.   Try to take the dose about the same time in the morning and in the evening. If you have difficulty swallowing the tablet whole please discuss with your pharmacist how to take the medication safely.  Take Eliquis exactly as prescribed and DO NOT stop taking Eliquis without talking to the doctor who prescribed the medication.  Stopping may increase your risk of developing a new blood clot.  Refill your prescription before you run out.  After discharge, you should have regular check-up appointments with your healthcare provider that is prescribing your Eliquis.    What do you do if you miss a dose? If a dose of ELIQUIS is not taken at the scheduled time, take it as soon as possible on the same day and twice-daily administration should be resumed. The dose should not be doubled to make up for a missed dose.  Important Safety Information A possible side effect of Eliquis is bleeding. You should call your healthcare provider right away if you experience any of the following: Bleeding from an injury or your nose that does not stop. Unusual colored urine (red or dark brown) or unusual colored stools (red or black). Unusual bruising for unknown reasons. A serious fall or if you hit your head (even if there is no bleeding).  Some medicines may interact with Eliquis and might increase your risk of bleeding or clotting while on Eliquis. To  help avoid this, consult your healthcare provider or pharmacist prior to using any new prescription or non-prescription medications, including herbals, vitamins, non-steroidal anti-inflammatory drugs (NSAIDs) and supplements.  This website has more information on Eliquis (apixaban): http://www.eliquis.com/eliquis/home

## 2022-06-30 NOTE — TOC Initial Note (Signed)
Transition of Care [facility]) - Initial/Assessment Note    Patient Details  Name: [name] MRN: [id number] Date of Birth: 1922/08/01  Transition of Care [facility]) CM/SW Contact:    [name], LCSW Phone Number: 06/30/2022, 3:49 PM  Clinical Narrative:   Pt asleep when CSW entered, did wake up, drowsy, not fully oriented per epic.  CSW spoke with daughter [name] by phone, discussed PT recommendations for SNF.  She and her sister [name] in agreement with SNF, pt was at [facility] before, they would like her to return if possible.  Confirmed pt is assisted living level at [facility].   Pt is vaccinated for covid with boosters.  Referral sent out in hub for SNF, reached out to [facility] to review.                 Expected Discharge Plan: Skilled Nursing Facility Barriers to Discharge: Continued Medical Work up, SNF Pending bed offer   Patient Goals and CMS Choice     Choice offered to / list presented to : Adult Children (daughter [name])      Expected Discharge Plan and Services In-house Referral: Clinical Social Work   Post Acute Care Choice: [place] Living arrangements for the past 2 months: [place] Passenger transport manager)                                      Prior Living Arrangements/Services Living arrangements for the past 2 months: [place] Passenger transport manager) Lives with:: Facility Resident Patient language and need for interpreter reviewed:: Yes        Need for Family Participation in Patient Care: Yes (Comment) Care giver support system in place?: Yes (comment) Current home services: Other (comment) (na) Criminal Activity/Legal Involvement Pertinent to Current Situation/Hospitalization: No - Comment as needed  Activities of Daily Living Home Assistive Devices/Equipment: Eyeglasses, Hearing aid, Wheelchair, Environmental consultant (specify type) Agricultural consultant) ADL Screening (condition at time of admission) Patient's cognitive ability  adequate to safely complete daily activities?: No Is the patient deaf or have difficulty hearing?: Yes Does the patient have difficulty seeing, even when wearing glasses/contacts?: Yes Does the patient have difficulty concentrating, remembering, or making decisions?: Yes Patient able to express need for assistance with ADLs?: Yes Does the patient have difficulty dressing or bathing?: Yes Independently performs ADLs?: No Does the patient have difficulty walking or climbing stairs?: Yes Weakness of Legs: Both Weakness of Arms/Hands: Both  Permission Sought/Granted                  Emotional Assessment Appearance:: Appears stated age Attitude/Demeanor/Rapport: Engaged Affect (typically observed): Other (comment) (drowsy) Orientation: : Oriented to Self, Oriented to Place, Oriented to Situation      Admission diagnosis:  DVT (deep vein thrombosis) in pregnancy [O22.30] Acute deep vein thrombosis (DVT) of proximal vein of left lower extremity (HCC) [I82.4Y2] Patient Active Problem List   Diagnosis Date Noted   DVT (deep venous thrombosis) ([place]) 06/28/2022   DVT (deep vein thrombosis) in pregnancy 06/28/2022   Basal cell carcinoma of nose 06/14/2021   Tinea pedis of both feet 12/06/2020   Hip fracture ([place]) 11/01/2020   Closed displaced fracture of right femoral neck ([place]) 10/29/2020   Discharge of eye, left 10/29/2020   Elevated serum creatinine 10/29/2020   Sprain of anterior talofibular ligament of left ankle 06/22/2020   Acute cystitis without hematuria 05/10/2020   History of  colonic polyps 04/28/2020   Urinary frequency 12/16/2019   Cough, persistent 11/06/2019   Acute neck pain 09/26/2019   Seborrheic dermatitis of scalp 09/26/2019   Nocturia 08/22/2019   Leukocytosis 06/13/2018   Seborrheic keratoses 10/16/2017   Hyponatremia 09/14/2017   Chronic left upper quadrant pain 08/07/2017   Right hip pain 07/12/2017   Hearing loss 02/20/2017   TIA (transient ischemic  attack), possible 11/14/2016   Left carpal tunnel syndrome 08/11/2016   Fatigue 06/15/2016   Constipation, chronic 03/21/2016   Near syncope 05/07/2015   Osteoarthritis of right shoulder 05/04/2015   Counseling regarding end of life decision making 01/15/2015   Skin lesion of right leg 07/10/2014   Insomnia 04/10/2014   Shakiness 12/16/2013   Osteoarthritis of right knee 10/14/2013   Osteoarthritis of left knee 10/14/2013   Infected sebaceous cyst 11/29/2012   Gout, chronic, without tophus 12/13/2009   Osteoporosis 10/29/2009   PALPITATIONS, OCCASIONAL 07/07/2008   Allergic rhinitis 06/02/2008   HEMANGIOMA 07/30/2006   Controlled type 2 diabetes mellitus without complication, without long-term current use of insulin ([place]) 07/30/2006   Hyperlipidemia LDL goal <100 07/30/2006   Essential hypertension, benign 07/30/2006   RECTAL POLYPS 07/30/2006   Osteoarthritis 07/30/2006   CATARACT, HX OF 07/30/2006   PCP:  Josetta Huddle, MD Pharmacy:   [id number]Phone: [phone]: [phone] [place]Phone: [phone]: [address]Phone: [phone]: [phone]    Social Determinants of Health (SDOH) Social History: SDOH Screenings   Food Insecurity: No Food Insecurity (06/28/2022)  Housing: Low Risk  (06/28/2022)  Transportation Needs: No Transportation Needs (06/28/2022)  Utilities: Not At Risk (06/28/2022)  Alcohol Screen: Low Risk  (10/13/2020)  Depression (PHQ2-9): Low Risk  (10/13/2020)  Financial Resource Strain: Low Risk  (10/13/2020)  Physical Activity: Inactive (10/13/2020)  Stress: No Stress Concern Present (10/13/2020)  Tobacco Use: Medium Risk (06/28/2022)   SDOH  Interventions: Housing Interventions: Patient Refused   Readmission Risk Interventions     No data to display

## 2022-06-30 NOTE — Progress Notes (Signed)
PROGRESS NOTE  [name]  DOB: 1922/06/27  PCP: Josetta Huddle, MD [id number]  DOA: 06/28/2022  LOS: 2 days  Hospital Day: 3  Brief narrative: [name] is a 89 y.o. female with PMH significant for DM2, HTN, HLD CKD, chronic pancreatic insufficiency, GERD, gout who lives at [facility] assisted living facility. 3/6, patient presented to the ED with worsening of left leg swelling and pain for 2 days. No previous history of DVT or PE.  In the ED, patient was afebrile, hemodynamically stable, maintaining O2 sat on room air Ultrasound duplex left lower extremity was positive for extensive DVT involving the left external iliac vein, left common femoral vein, SFAs on exam, left femoral vein, left proximal profunda vein, left popliteal vein, left posterior tibial veins and left peroneal veins. Given the significant extent of clot burden, vascular surgery was consulted.  Based on her age and comorbidities, surgical intervention was not recommended.  Conservative management with anticoagulation was suggested. Patient was started on heparin drip. Admitted to [facility]  Subjective: Patient was seen and examined this morning.   Propped up in bed.  Not in distress 90 symptoms.  Left leg swelling improving.  Daughter at bedside.  She is worried about patient's worsening physical strength.  Seen by PT.  SNF recommended. Overnight remains hemodynamically stable  Assessment and plan: Acute extensive DVT of LLE Unprovoked.   Extensive clot extent as above.   For vascular surgery recommendation, patient was started on heparin drip.  Continued for 24 to 48 hours.  Switched to oral Eliquis this morning. Swelling seems to be improving.  Continue to keep limb elevated  No evidence of PE and hence CTA was not obtained.  Type 2 diabetes mellitus A1c 6 in 2022 Diet controlled.  Glucose level in blood works remain consistently controlled.  Essential hypertension Continue ramipril  CKD stage  IIIa Euvolemic, creatinine level stable  Impaired mobility/falls Per daughter, patient is having more frequent falls lately.  She uses rollator to move around and has a wheelchair as well but prefers not to use it. We have discussed about the risk of life-threatening/fatal fall while on anticoagulation. PT eval obtained.  SNF recommended   Goals of care   Code Status: DNR     DVT prophylaxis:  apixaban (ELIQUIS) tablet 10 mg  apixaban (ELIQUIS) tablet 5 mg   Antimicrobials: None Fluid: None Consultants: None Family Communication: Daughter at bedside  Status is: Inpatient Level of care: Telemetry Medical   Dispo: Patient is from: Assisted living facility              Anticipated d/c is to: SNF Continue in-hospital care because: Pending SNF.  Medically stable for discharge.   Scheduled Meds:  apixaban  10 mg Oral BID   Followed by   Derrill Memo ON 07/07/2022] apixaban  5 mg Oral BID   loratadine  10 mg Oral Daily   mirabegron ER  25 mg Oral Daily   polyvinyl alcohol  1-2 drop Both Eyes Daily   ramipril  5 mg Oral Daily    PRN meds: acetaminophen **OR** acetaminophen, fluticasone, HYDROmorphone (DILAUDID) injection, ondansetron **OR** ondansetron (ZOFRAN) IV, sodium chloride   Infusions:     Diet:  Diet Order             Diet Heart Room service appropriate? Yes; Fluid consistency: Thin  Diet effective now                   Antimicrobials: Anti-infectives (From admission,  onward)    Start     Dose/Rate Route Frequency Ordered Stop   06/28/22 1530  ceFAZolin (ANCEF) IVPB 1 g/50 mL premix  Status:  Discontinued        1 g 100 mL/hr over 30 Minutes Intravenous  Once 06/28/22 1519 06/28/22 1526       Skin assessment:       Nutritional status:  Body mass index is 23.43 kg/m.          Objective: Vitals:   06/30/22 0423 06/30/22 0758  BP: (!) 150/84 (!) 139/94  Pulse: (!) 104 (!) 51  Resp: 18 18  Temp:  98.6 F (37 C)  SpO2: 96% (!) 72%     Intake/Output Summary (Last 24 hours) at 06/30/2022 1340 Last data filed at 06/30/2022 0430 Gross per 24 hour  Intake 374.58 ml  Output --  Net 374.58 ml    Filed Weights   06/28/22 1318  Weight: 60 kg   Weight change:  Body mass index is 23.43 kg/m.   Physical Exam: General exam: Pleasant, elderly Caucasian female.  Not in pain Skin: No rashes, lesions or ulcers. HEENT: Atraumatic, normocephalic, no obvious bleeding Lungs: Clear to auscultation bilaterally CVS: Regular rate and rhythm, no murmur GI/Abd soft, nontender, nondistended, bowel sound present CNS: Alert, awake, oriented x 3, hard of hearing Psychiatry: Mood appropriate Extremities: Left lower extremity pedal edema improving.  Calf tenderness present on the left  Data Review: I have personally reviewed the laboratory data and studies available.  F/u labs ordered Unresulted Labs (From admission, onward)     Start     Ordered   06/30/22 0500  CBC  Daily,   R      06/29/22 1309            Total time spent in review of labs and imaging, patient evaluation, formulation of plan, documentation and communication with family: 75 minutes  Signed, Terrilee Croak, MD [facility] 06/30/2022

## 2022-06-30 NOTE — Progress Notes (Signed)
[facility] for Heparin >> Eliquis Indication: DVT  Allergies  Allergen Reactions   Codeine Nausea And Vomiting   Statins Other (See Comments)    Pt does not remember the reaction    Patient Measurements: Height: '5\' 3"'$  (160 cm) Weight: 60 kg (132 lb 4.4 oz) IBW/kg (Calculated) : 52.4 Heparin Dosing Weight: 60kg  Vital Signs: Temp: 98.1 F (36.7 C) (03/07 1943) Temp Source: Oral (03/07 1943) BP: 150/84 (03/08 0423) Pulse Rate: 104 (03/08 0423)  Labs: Recent Labs    06/28/22 1322 06/29/22 0415 06/29/22 1205 06/30/22 0418  HGB 12.2 11.8*  --  11.9*  HCT 38.4 34.9*  --  35.1*  PLT 278 264  --  283  HEPARINUNFRC  --  0.55 0.67 0.75*  CREATININE 1.47* 1.28*  --   --      Estimated Creatinine Clearance: 20.8 mL/min (A) (by C-G formula based on SCr of 1.28 mg/dL (H)).   Medical History: Past Medical History:  Diagnosis Date   Acute pharyngitis    Acute sinusitis, unspecified    Acute upper respiratory infections of unspecified site    Allergy    Anal and rectal polyp    BCC (basal cell carcinoma) 05/18/2021   right nasal sidewall, radiaiton completed 07/29/21   Cataract    History of   Cavernous angioma    Degenerative cervical disc 02/22/2006   Diabetes mellitus    Type II   GERD (gastroesophageal reflux disease)    Gout    Hemangioma of unspecified site    Hyperlipidemia    Hypertension    Orthostatic hypotension 04/24/2006   Hospital   Osteoarthritis    Osteopenia    Other abscess of vulva    Palpitations    SCC (squamous cell carcinoma) 05/18/2021   left dorsal hand over MCP, EDC 06/22/21   SCC (squamous cell carcinoma) 12/29/2021   right upper arm, ED&C 02/02/22   Squamous cell carcinoma in situ (SCCIS) 05/18/2021   right lower leg medial, EDC 06/22/21   Squamous cell carcinoma in situ (SCCIS) 12/29/2021   left superior shoulder, EDC 02/02/22   Syncope and collapse    1 episode    Medications:  Medications  Prior to Admission  Medication Sig Dispense Refill Last Dose   Accu-Chek Softclix Lancets lancets CHECK BLOOD SUGAR 1 TIME A DAY. DX: E11.9 100 each 3    aspirin EC 81 MG tablet Take 81 mg by mouth daily. Swallow whole.      azelastine (ASTELIN) 0.1 % nasal spray Place 1 spray into both nostrils 2 (two) times daily. Use in each nostril as directed 30 mL 0    Blood Glucose Monitoring Suppl (ACCU-CHEK GUIDE) w/Device KIT 1 each by Does not apply route daily. Use to check blood sugar daily as needed.  Dx: E11.9 1 kit 0    fluticasone (FLONASE) 50 MCG/ACT nasal spray PLACE 2 SPRAYS INTO BOTH NOSTRILS DAILY AS NEEDED FOR ALLERGIES. 48 mL 1    glucose blood (ACCU-CHEK GUIDE) test strip Use to check blood sugar 1 time daily 100 each 3    Loratadine 10 MG CAPS Take 1 capsule by mouth daily.      Multiple Vitamin (MULTIVITAMIN PO) Take 1 tablet by mouth daily.      MYRBETRIQ 25 MG TB24 tablet Take 25 mg by mouth daily.      Propylene Glycol (SYSTANE BALANCE) 0.6 % SOLN Place 1-2 drops into both eyes daily.  ramipril (ALTACE) 5 MG capsule TAKE 1 CAPSULE BY MOUTH EVERY DAY 90 capsule 1    SALINE NASAL SPRAY NA Place 2 sprays into the nose every 6 (six) hours as needed (nasal stuffiness).       Scheduled:  Infusions:  PRN:   Assessment: 60 yof with a history of HLD, HTN, DM 2, GERD . Patient is presenting with several days of left leg swelling. Pharmacy consulted to transition patient from IV heparin to Eliquis for VTE treatment. CBC stable. HL slightly supratherapeutic this morning at 0.75.  Goal of Therapy:  Monitor platelets by anticoagulation protocol: Yes   Plan:  Discontinue heparin Begin Eliquis '10mg'$  PO BID x 7 days, followed by '5mg'$  BID thereafter   Dimple Nanas, PharmD, BCPS 06/30/2022 7:39 AM

## 2022-07-01 LAB — URINALYSIS, ROUTINE W REFLEX MICROSCOPIC
Bilirubin Urine: NEGATIVE
Glucose, UA: NEGATIVE mg/dL
Ketones, ur: NEGATIVE mg/dL
Nitrite: NEGATIVE
Protein, ur: NEGATIVE mg/dL
Specific Gravity, Urine: 1.029 (ref 1.005–1.030)
pH: 5 (ref 5.0–8.0)

## 2022-07-01 LAB — CBC
HCT: 34.4 % — ABNORMAL LOW (ref 36.0–46.0)
Hemoglobin: 11.4 g/dL — ABNORMAL LOW (ref 12.0–15.0)
MCH: 31.8 pg (ref 26.0–34.0)
MCHC: 33.1 g/dL (ref 30.0–36.0)
MCV: 95.8 fL (ref 80.0–100.0)
Platelets: 324 10*3/uL (ref 150–400)
RBC: 3.59 MIL/uL — ABNORMAL LOW (ref 3.87–5.11)
RDW: 13.4 % (ref 11.5–15.5)
WBC: 14.3 10*3/uL — ABNORMAL HIGH (ref 4.0–10.5)
nRBC: 0 % (ref 0.0–0.2)

## 2022-07-01 MED ORDER — SODIUM CHLORIDE 0.9 % IV SOLN: INTRAVENOUS | Status: DC

## 2022-07-01 MED ORDER — ACETAMINOPHEN 500 MG PO TABS
1000.0000 mg | ORAL_TABLET | Freq: Three times a day (TID) | ORAL | Status: DC
  Administered 2022-07-01 – 2022-07-03 (×7): 1000 mg via ORAL
  Filled 2022-07-01 (×6): qty 2

## 2022-07-01 NOTE — Progress Notes (Signed)
PROGRESS NOTE  [name]  DOB: January 27, 1923  PCP: Josetta Huddle, MD [id number]  DOA: 06/28/2022  LOS: 3 days  Hospital Day: 4  Brief narrative: [name] is a 89 y.o. female with PMH significant for DM2, HTN, HLD CKD, chronic pancreatic insufficiency, GERD, gout who lives at [facility] facility. 3/6, patient presented to the ED with worsening of left leg swelling and pain for 2 days. No previous history of DVT or PE.  In the ED, patient was afebrile, hemodynamically stable, maintaining O2 sat on room air Ultrasound duplex left lower extremity was positive for extensive DVT involving the left external iliac vein, left common femoral vein, SFAs on exam, left femoral vein, left proximal profunda vein, left popliteal vein, left posterior tibial veins and left peroneal veins. Given the significant extent of clot burden, vascular surgery was consulted.  Based on her age and comorbidities, surgical intervention was not recommended.  Conservative management with anticoagulation was suggested. Patient was started on heparin drip. Admitted to [facility]  Subjective: Patient was seen and examined this morning.   Lying down in bed.  Complains of pain on the right hand.  Has osteoarthritis that flares up at times. Daughter at side. PT recommended SNF.  Pending  Assessment and plan: Acute extensive DVT of LLE Unprovoked.   Extensive clot extent as above.   For vascular surgery recommendation, patient was started on heparin drip, switch to oral Eliquis ultimately.  Swelling gradually improving.  Continue to keep limb elevated  No evidence of PE and hence CTA was not obtained.  Type 2 diabetes mellitus A1c 6 in 2022 Diet controlled.  Glucose level in blood works remain consistently controlled.  Essential hypertension Continue ramipril  CKD stage IIIa Euvolemic, creatinine level stable Looks dry clinically.  I started on normal saline at 75 mill per hour for next 24  hours  Right hand pain History of osteoarthritis and flares of times.  Daughter wants to avoid strong pain medicines.  Ordered for scheduled Tylenol..  Impaired mobility/falls Per daughter, patient is having more frequent falls lately.  She uses rollator to move around and has a wheelchair as well but prefers not to use it. We have discussed about the risk of life-threatening/fatal fall while on anticoagulation. PT eval obtained.  SNF recommended    Goals of care   Code Status: DNR     DVT prophylaxis:  apixaban (ELIQUIS) tablet 10 mg  apixaban (ELIQUIS) tablet 5 mg   Antimicrobials: None Fluid: None Consultants: None Family Communication: Daughter at bedside  Status is: Inpatient Level of care: Med-Surg   Dispo: Patient is from: Assisted living facility              Anticipated d/c is to: SNF Continue in-hospital care because: Pending SNF.  Medically stable for discharge.   Scheduled Meds:  acetaminophen  1,000 mg Oral TID   apixaban  10 mg Oral BID   Followed by   Derrill Memo ON 07/07/2022] apixaban  5 mg Oral BID   loratadine  10 mg Oral Daily   mirabegron ER  25 mg Oral Daily   polyvinyl alcohol  1-2 drop Both Eyes Daily   ramipril  5 mg Oral Daily    PRN meds: fluticasone, HYDROmorphone (DILAUDID) injection, ondansetron **OR** ondansetron (ZOFRAN) IV, sodium chloride   Infusions:   sodium chloride 75 mL/hr at 07/01/22 1313     Diet:  Diet Order  Diet Heart Room service appropriate? Yes; Fluid consistency: Thin  Diet effective now                   Antimicrobials: Anti-infectives (From admission, onward)    Start     Dose/Rate Route Frequency Ordered Stop   06/28/22 1530  ceFAZolin (ANCEF) IVPB 1 g/50 mL premix  Status:  Discontinued        1 g 100 mL/hr over 30 Minutes Intravenous  Once 06/28/22 1519 06/28/22 1526       Skin assessment:       Nutritional status:  Body mass index is 23.43 kg/m.           Objective: Vitals:   07/01/22 0853 07/01/22 0900  BP: 112/83   Pulse: 80   Resp: 20 20  Temp: 98 F (36.7 C)   SpO2: 100%    No intake or output data in the 24 hours ending 07/01/22 1356  Filed Weights   06/28/22 1318  Weight: 60 kg   Weight change:  Body mass index is 23.43 kg/m.   Physical Exam: General exam: Pleasant, elderly Caucasian female.  Not in pain Skin: No rashes, lesions or ulcers. HEENT: Atraumatic, normocephalic, no obvious bleeding Lungs: Clear to auscultation bilaterally CVS: Regular rate and rhythm, no murmur GI/Abd soft, nontender, nondistended, bowel sound present CNS: Alert, awake, oriented x 3, hard of hearing Psychiatry: Mood appropriate Extremities: Left lower extremity pedal edema improving.  Calf tenderness present on the left  Data Review: I have personally reviewed the laboratory data and studies available.  F/u labs ordered Unresulted Labs (From admission, onward)     Start     Ordered   07/01/22 1120  Urinalysis, Routine w reflex microscopic -Urine, Clean Catch  Once,   R       Question:  Specimen Source  Answer:  Urine, Clean Catch   07/01/22 1119   06/30/22 0500  CBC  Daily,   R      06/29/22 1309            Total time spent in review of labs and imaging, patient evaluation, formulation of plan, documentation and communication with family: 25 minutes  Signed, Terrilee Croak, MD [facility] 07/01/2022

## 2022-07-01 NOTE — Plan of Care (Signed)

## 2022-07-02 ENCOUNTER — Inpatient Hospital Stay (HOSPITAL_COMMUNITY): Payer: Medicare Other

## 2022-07-02 LAB — CBC
HCT: 33.4 % — ABNORMAL LOW (ref 36.0–46.0)
Hemoglobin: 11 g/dL — ABNORMAL LOW (ref 12.0–15.0)
MCH: 31.8 pg (ref 26.0–34.0)
MCHC: 32.9 g/dL (ref 30.0–36.0)
MCV: 96.5 fL (ref 80.0–100.0)
Platelets: 338 10*3/uL (ref 150–400)
RBC: 3.46 MIL/uL — ABNORMAL LOW (ref 3.87–5.11)
RDW: 13.4 % (ref 11.5–15.5)
WBC: 14 10*3/uL — ABNORMAL HIGH (ref 4.0–10.5)
nRBC: 0 % (ref 0.0–0.2)

## 2022-07-02 LAB — URIC ACID: Uric Acid, Serum: 5.8 mg/dL (ref 2.5–7.1)

## 2022-07-02 MED ORDER — TRAMADOL HCL 50 MG PO TABS
25.0000 mg | ORAL_TABLET | Freq: Two times a day (BID) | ORAL | Status: DC | PRN
  Administered 2022-07-02: 25 mg via ORAL
  Filled 2022-07-02: qty 1

## 2022-07-02 NOTE — Progress Notes (Addendum)
Mobility Specialist Progress Note    07/02/22 1423  Mobility  Activity Ambulated with assistance in room  Level of Assistance Moderate assist, patient does 50-74%  Assistive Device Front wheel walker  Distance Ambulated (ft) 3 ft  Activity Response Tolerated fair  Mobility Referral Yes  $Mobility charge 1 Mobility   Pt received in bed and agreeable to get up to chair. Pt modA for bed mobility and to stand. Pt required verbal and tactile cues for gait progression and posture. Left with call bell in reach and RN present.   Hildred Alamin Mobility Specialist  Please Psychologist, sport and exercise or Rehab Office at [phone]

## 2022-07-02 NOTE — Progress Notes (Signed)
PROGRESS NOTE  [name]  DOB: [birth year]  PCP: Josetta Huddle, MD [id number]  DOA: 06/28/2022  LOS: 4 days  Hospital Day: 5  Brief narrative: [name] is a 89 y.o. female with PMH significant for DM2, HTN, HLD CKD, chronic pancreatic insufficiency, GERD, gout who lives at [facility] assisted [facility] facility. 3/6, patient presented to the ED with worsening of left leg swelling and pain for 2 days. No previous history of DVT or PE.  In the ED, patient was afebrile, hemodynamically stable, maintaining O2 sat on room air Ultrasound duplex left lower extremity was positive for extensive DVT involving the left external iliac vein, left common femoral vein, SFAs on exam, left femoral vein, left proximal profunda vein, left popliteal vein, left posterior tibial veins and left peroneal veins. Given the significant extent of clot burden, vascular surgery was consulted.  Based on her age and comorbidities, surgical intervention was not recommended.  Conservative management with anticoagulation was suggested. Patient was started on heparin drip. Admitted to [facility]  Subjective: Patient was seen and examined this morning.   Lying down in bed.  Complains of worsening pain in the right hand. Daughter not at bedside today.  Assessment and plan: Acute extensive DVT of LLE Unprovoked.   Extensive clot extent as above.   For vascular surgery recommendation, patient was started on heparin drip, switch to oral Eliquis ultimately.  Swelling gradually improving.  Continue to keep limb elevated  No evidence of PE and hence CTA was not obtained.  Acute flare of of osteoarthritis History of osteoarthritis and flares of times.   Right hand seems to have multijoint swelling, tenderness.  She is also getting IV fluids through the same arm.   I think she is having a flareup of osteoarthritis.  Rule out gout.  Obtain uric acid Provide an x-ray this morning showed. Advanced degenerative changes in  the lateral carpus and first carpometacarpal joint, advanced degenerative changes in scattered PIP and DIP joints. Deformity of the fifth metacarpal suggest prior healed trauma.  Currently scheduled Tylenol for pain control.  I added low-dose tramadol PRN as well.  Type 2 diabetes mellitus A1c 6 in 2022 Diet controlled.  Glucose level in blood works remain consistently controlled.  Essential hypertension Continue ramipril  CKD stage IIIa Euvolemic, creatinine level stable Looks dry clinically.  Currently remains on normal saline at 75 mill per hour.  I would continue it for next 24 hours.    Impaired mobility/falls Per daughter, patient is having more frequent falls lately.  She uses rollator to move around and has a wheelchair as well but prefers not to use it. We have discussed about the risk of life-threatening/fatal fall while on anticoagulation. PT eval obtained.  SNF recommended   Goals of care   Code Status: DNR     DVT prophylaxis:  apixaban (ELIQUIS) tablet 10 mg  apixaban (ELIQUIS) tablet 5 mg   Antimicrobials: None Fluid: NS at 75 mill per hour to continue Consultants: None Family Communication: Daughter not at bedside today  Status is: Inpatient Level of care: Med-Surg   Dispo: Patient is from: Assisted living facility              Anticipated d/c is to: SNF Continue in-hospital care because: Pending SNF.  Medically stable for discharge.   Scheduled Meds:  acetaminophen  1,000 mg Oral TID   apixaban  10 mg Oral BID   Followed by   Derrill Memo ON 07/07/2022] apixaban  5 mg Oral  BID   loratadine  10 mg Oral Daily   mirabegron ER  25 mg Oral Daily   polyvinyl alcohol  1-2 drop Both Eyes Daily   ramipril  5 mg Oral Daily    PRN meds: fluticasone, HYDROmorphone (DILAUDID) injection, ondansetron **OR** ondansetron (ZOFRAN) IV, sodium chloride   Infusions:   sodium chloride 75 mL/hr at 07/01/22 1313     Diet:  Diet Order             Diet Heart Room  service appropriate? Yes; Fluid consistency: Thin  Diet effective now                   Antimicrobials: Anti-infectives (From admission, onward)    Start     Dose/Rate Route Frequency Ordered Stop   06/28/22 1530  ceFAZolin (ANCEF) IVPB 1 g/50 mL premix  Status:  Discontinued        1 g 100 mL/hr over 30 Minutes Intravenous  Once 06/28/22 1519 06/28/22 1526       Skin assessment:       Nutritional status:  Body mass index is 23.43 kg/m.          Objective: Vitals:   07/02/22 0506 07/02/22 0754  BP: (!) 164/89 138/83  Pulse: 92 87  Resp: 19 16  Temp: 97.7 F (36.5 C) (!) 97.5 F (36.4 C)  SpO2: 100% 98%    Intake/Output Summary (Last 24 hours) at 07/02/2022 1125 Last data filed at 07/02/2022 0506 Gross per 24 hour  Intake 240 ml  Output 400 ml  Net -160 ml    Filed Weights   06/28/22 1318  Weight: 60 kg   Weight change:  Body mass index is 23.43 kg/m.   Physical Exam: General exam: Pleasant, elderly Caucasian female.  Not in pain Skin: No rashes, lesions or ulcers. HEENT: Atraumatic, normocephalic, no obvious bleeding Lungs: Clear to auscultation bilaterally CVS: Regular rate and rhythm, no murmur GI/Abd soft, nontender, nondistended, bowel sound present CNS: Alert, awake, oriented x 3, hard of hearing Psychiatry: Mood appropriate Extremities: Left lower extremity pedal edema improving.  Calf tenderness present on the left.  Right hand with swelling and tenderness in multiple joints.  Data Review: I have personally reviewed the laboratory data and studies available.  F/u labs ordered Unresulted Labs (From admission, onward)    None       Total time spent in review of labs and imaging, patient evaluation, formulation of plan, documentation and communication with family: 65 minutes  Signed, Terrilee Croak, MD [facility] 07/02/2022

## 2022-07-02 NOTE — Evaluation (Signed)
Clinical/Bedside Swallow Evaluation Patient Details  Name: [name] MRN: [id number] Date of Birth: 07/17/1922  Today's Date: 07/02/2022 Time: SLP Start Time (ACUTE ONLY): 1232 SLP Stop Time (ACUTE ONLY): 1243 SLP Time Calculation (min) (ACUTE ONLY): 11 min  Past Medical History:  Past Medical History:  Diagnosis Date   Acute pharyngitis    Acute sinusitis, unspecified    Acute upper respiratory infections of unspecified site    Allergy    Anal and rectal polyp    BCC (basal cell carcinoma) 05/18/2021   right nasal sidewall, radiaiton completed 07/29/21   Cataract    History of   Cavernous angioma    Degenerative cervical disc 02/22/2006   Diabetes mellitus    Type II   GERD (gastroesophageal reflux disease)    Gout    Hemangioma of unspecified site    Hyperlipidemia    Hypertension    Orthostatic hypotension 04/24/2006   Hospital   Osteoarthritis    Osteopenia    Other abscess of vulva    Palpitations    SCC (squamous cell carcinoma) 05/18/2021   left dorsal hand over MCP, EDC 06/22/21   SCC (squamous cell carcinoma) 12/29/2021   right upper arm, ED&C 02/02/22   Squamous cell carcinoma in situ (SCCIS) 05/18/2021   right lower leg medial, EDC 06/22/21   Squamous cell carcinoma in situ (SCCIS) 12/29/2021   left superior shoulder, EDC 02/02/22   Syncope and collapse    1 episode   Past Surgical History:  Past Surgical History:  Procedure Laterality Date   ABDOMINAL HYSTERECTOMY     Aortic dopplers     Mild plaque.  EEG okay   CATARACT EXTRACTION     Cavernous angioma     Cavernous hemangioma  04/2004   Hospital   TOTAL HIP ARTHROPLASTY Right 11/01/2020   Procedure: TOTAL HIP ARTHROPLASTY ANTERIOR APPROACH;  Surgeon: Gaynelle Arabian, MD;  Location: [facility]S;  Service: Orthopedics;  Laterality: Right;   TOTAL KNEE ARTHROPLASTY     Right   HPI:  89 y.o. female admitted 3/6 after presenting with worsening of left leg swelling and pain; found to have acute extensive  DVT of LLE. Reportedly had coughing episode with sausage at breakfast. PMH significant for DM2, HTN, HLD CKD, chronic pancreatic insufficiency, GERD, gout.    Assessment / Plan / Recommendation  Clinical Impression  Pt participated in clinical swallowing assessment. Her daughter was present and assisting with lunch tray.  Pt demonstrated prolonged mastication of regular solids (she avoids tough/crumbly foods). There were no s/s of aspiration during sequential swallows of thin liquids nor when following solids with liquids.  No concerns for dysphagia. Recommend continuing current diet; avoid foods known to be difficult to masticate. No f/u is needed. SLP Visit Diagnosis: Dysphagia, unspecified (R13.10)    Aspiration Risk  No limitations    Diet Recommendation   Regular solids, thin liquids  Medication Administration: Whole meds with liquid    Other  Recommendations Oral Care Recommendations: Oral care BID    Recommendations for follow up therapy are one component of a multi-disciplinary discharge planning process, led by the attending physician.  Recommendations may be updated based on patient status, additional functional criteria and insurance authorization.  Follow up Recommendations No SLP follow up        [place] Date of Onset: 06/28/22 HPI: 89 y.o. female admitted 3/6 after presenting with worsening of left leg swelling and pain; found to have acute extensive DVT of  LLE. Reportedly had coughing episode with sausage at breakfast. PMH significant for DM2, HTN, HLD CKD, chronic pancreatic insufficiency, GERD, gout. Type of Study: Bedside Swallow Evaluation Previous Swallow Assessment: no Diet Prior to this Study: Regular;Thin liquids (Level 0) Temperature Spikes Noted: No Respiratory Status: Room air History of Recent Intubation: No Behavior/Cognition: Alert;Cooperative Oral Cavity Assessment: Within Functional Limits Oral Care Completed by SLP: Recent completion  by staff Oral Cavity - Dentition: Adequate natural dentition Vision: Functional for self-feeding Self-Feeding Abilities: Needs assist Patient Positioning: Upright in bed Baseline Vocal Quality: Normal Volitional Cough: Strong Volitional Swallow: Able to elicit    Oral/Motor/Sensory Function Overall Oral Motor/Sensory Function: Within functional limits   Ice Chips Ice chips: Within functional limits   Thin Liquid Thin Liquid: Within functional limits    Nectar Thick Nectar Thick Liquid: Not tested   Honey Thick Honey Thick Liquid: Not tested   Puree Puree: Within functional limits   Solid     Solid: Within functional limits      Juan Quam Laurice 07/02/2022,1:26 PM Estill Bamberg L. Tivis Ringer, MA CCC/SLP Clinical Specialist - [place] Office number [phone]

## 2022-07-03 MED ORDER — CEPHALEXIN 500 MG PO CAPS: 500.0000 mg | ORAL_CAPSULE | Freq: Three times a day (TID) | ORAL | Status: AC

## 2022-07-03 MED ORDER — APIXABAN 5 MG PO TABS: 10.0000 mg | ORAL_TABLET | Freq: Two times a day (BID) | ORAL | Status: DC

## 2022-07-03 MED ORDER — ACETAMINOPHEN 500 MG PO TABS: 1000.0000 mg | ORAL_TABLET | Freq: Three times a day (TID) | ORAL | 0 refills | Status: AC

## 2022-07-03 MED ORDER — APIXABAN 5 MG PO TABS: 5.0000 mg | ORAL_TABLET | Freq: Two times a day (BID) | ORAL | Status: DC

## 2022-07-03 MED ORDER — CEPHALEXIN 500 MG PO CAPS
500.0000 mg | ORAL_CAPSULE | Freq: Three times a day (TID) | ORAL | Status: DC
  Administered 2022-07-03 (×2): 500 mg via ORAL
  Filled 2022-07-03 (×2): qty 1

## 2022-07-03 NOTE — TOC Transition Note (Signed)
Transition of Care [facility]) - CM/SW Discharge Note   Patient Details  Name: [name] MRN: [id number] Date of Birth: 1922/05/10  [facility]) CM/SW Contact:  [name], LCSW Phone Number: 07/03/2022, 1:37 PM   Clinical Narrative:   Pt discharging tp [facility], room 209.  RN call report to [phone].     Final next level of care: Skilled Nursing Facility Barriers to Discharge: Barriers Resolved   Patient Goals and CMS Choice   Choice offered to / list presented to : Adult Children (daughter [name])  Discharge Placement                Patient chooses bed at: [place], [place] Patient to be transferred to facility by: [place] [facility] Name of family member notified: daughter [name] in room Patient and family notified of of transfer: 07/03/22  Discharge Plan and Services Additional resources added to the After Visit Summary for   In-house Referral: Clinical Social Work   Post Acute Care Choice: [place]                               Social Determinants of Health (SDOH) Interventions SDOH Screenings   Food Insecurity: No Food Insecurity (06/28/2022)  Housing: Low Risk  (06/28/2022)  Transportation Needs: No Transportation Needs (06/28/2022)  Utilities: Not At Risk (06/28/2022)  Alcohol Screen: Low Risk  (10/13/2020)  Depression (PHQ2-9): Low Risk  (10/13/2020)  Financial Resource Strain: Low Risk  (10/13/2020)  Physical Activity: Inactive (10/13/2020)  Stress: No Stress Concern Present (10/13/2020)  Tobacco Use: Medium Risk (06/28/2022)     Readmission Risk Interventions     No data to display

## 2022-07-03 NOTE — Progress Notes (Signed)
Physical Therapy Treatment Patient Details Name: [name] MRN: [id number] DOB: 03-Aug-1922 Today's Date: 07/03/2022   History of Present Illness 89 y.o. female admitted 3/6 after presenting with worsening of left leg swelling and pain; found to have acute extensive DVT of LLE. PMH significant for DM2, HTN, HLD CKD, chronic pancreatic insufficiency, GERD, gout.    PT Comments    Progressing towards acute functional goals. Using RW for support, mod assist to stand from bed x3 and pivot to recliner today. Bowel incontinence, assisted with peri-care while pt stood several minutes with RW to stabilize. Progressed with pivot transfer to recliner, showing improved WB tolerance on Lt. Mod assist for pivot to facilitate desired direction to recliner. Patient will continue to benefit from skilled physical therapy services to further improve independence with functional mobility.   Recommendations for follow up therapy are one component of a multi-disciplinary discharge planning process, led by the attending physician.  Recommendations may be updated based on patient status, additional functional criteria and insurance authorization.  Follow Up Recommendations  Skilled nursing-short term rehab (<3 hours/day) Can patient physically be transported by private vehicle: Yes   Assistance Recommended at Discharge Frequent or constant Supervision/Assistance  Patient can return home with the following A lot of help with walking and/or transfers;A lot of help with bathing/dressing/bathroom;Assistance with cooking/housework;Direct supervision/assist for medications management;Direct supervision/assist for financial management;Assist for transportation   Equipment Recommendations  None recommended by PT    Recommendations for Other Services       Precautions / Restrictions Precautions Precautions: Fall Restrictions Weight Bearing Restrictions: No     Mobility  Bed Mobility Overal bed mobility:  Needs Assistance Bed Mobility: Supine to Sit     Supine to sit: Mod assist, HOB elevated     General bed mobility comments: Mod assist to facilitate LEs, and assist with trunk to rise to EOB. Pt showed notable improvement in volitional trunk flexion. to rise to EOB, required cues for technique and use of bed back to scoot forward. Limited push through RUE due to hand pain.    Transfers Overall transfer level: Needs assistance Equipment used: Rolling walker (2 wheels) Transfers: Sit to/from Stand, Bed to chair/wheelchair/BSC Sit to Stand: Mod assist Stand pivot transfers: Mod assist         General transfer comment: Mod assist for boost to stand, progressed to min assist after 3rd trial. pt with bowel incontinence. Assisted with peri care, tolerated staing prolonged period of time up to 3 min while being assisted. Cues for upright posture. Mod assist for RW control, balance, and tactile cues to facilitate pivot transfer towards left to recliner. Better tolerance with WB through LLE today, no buckling.    Ambulation/Gait               General Gait Details: Unable at this time.   Stairs             Wheelchair Mobility    Modified Rankin (Stroke Patients Only)       Balance Overall balance assessment: Needs assistance Sitting-balance support: Feet supported, No upper extremity supported Sitting balance-Leahy Scale: Fair     Standing balance support: Bilateral upper extremity supported, Reliant on assistive device for balance Standing balance-Leahy Scale: Poor                              Cognition Arousal/Alertness: Awake/alert Behavior During Therapy: WFL for tasks assessed/performed Overall Cognitive Status: Impaired/Different from  baseline Area of Impairment: Orientation, Memory, Following commands, Problem solving                 Orientation Level: Disoriented to, Place, Time, Situation   Memory: Decreased recall of precautions,  Decreased short-term memory Following Commands: Follows one step commands inconsistently, Follows one step commands with increased time     Problem Solving: Slow processing, Decreased initiation, Difficulty sequencing, Requires verbal cues, Requires tactile cues General Comments: Daughter reports pt typically oriented. Notices pt having some difficulty with word finding.        Exercises      General Comments        Pertinent Vitals/Pain Pain Assessment Pain Assessment: Faces Faces Pain Scale: Hurts little more Pain Location: Lt leg Pain Descriptors / Indicators: Aching, Guarding Pain Intervention(s): Monitored during session, Repositioned    Home Living                          Prior Function            PT Goals (current goals can now be found in the care plan section) Acute Rehab PT Goals Patient Stated Goal: none stated PT Goal Formulation: With patient/family Time For Goal Achievement: 07/14/22 Potential to Achieve Goals: Good Progress towards PT goals: Progressing toward goals    Frequency    Min 3X/week      PT Plan Current plan remains appropriate    Co-evaluation              AM-PAC PT "6 Clicks" Mobility   Outcome Measure  Help needed turning from your back to your side while in a flat bed without using bedrails?: A Lot Help needed moving from lying on your back to sitting on the side of a flat bed without using bedrails?: A Lot Help needed moving to and from a bed to a chair (including a wheelchair)?: A Lot Help needed standing up from a chair using your arms (e.g., wheelchair or bedside chair)?: A Lot Help needed to walk in hospital room?: Total Help needed climbing 3-5 steps with a railing? : Total 6 Click Score: 10    End of Session Equipment Utilized During Treatment: Gait belt Activity Tolerance: Patient tolerated treatment well Patient left: in chair;with call bell/phone within reach;with chair alarm set;with  family/visitor present Nurse Communication: Mobility status (Had BM) PT Visit Diagnosis: Unsteadiness on feet (R26.81);Other abnormalities of gait and mobility (R26.89);Muscle weakness (generalized) (M62.81);Repeated falls (R29.6);History of falling (Z91.81);Difficulty in walking, not elsewhere classified (R26.2);Pain Pain - Right/Left: Left Pain - part of body: Leg     Time: [id number] PT Time Calculation (min) (ACUTE ONLY): 19 min  Charges:  $Therapeutic Activity: 8-22 mins                     Candie Mile, PT, DPT Physical Therapist Acute Rehabilitation Services [place]    Ellouise Newer 07/03/2022, 2:11 PM

## 2022-07-03 NOTE — Plan of Care (Signed)
AVS reviewed with patient.  Education adequate for discharge

## 2022-07-03 NOTE — Discharge Summary (Signed)
Physician Discharge Summary  [name] [id number] DOB: 1922/10/25 DOA: 06/28/2022  PCP: Josetta Huddle, MD  Admit date: 06/28/2022 Discharge date: 07/03/2022  Admitted From: Home Discharge disposition: SNF  Brief narrative: [name] is a 89 y.o. female with PMH significant for DM2, HTN, HLD CKD, chronic pancreatic insufficiency, GERD, gout who lives at [facility] assisted living facility. 3/6, patient presented to the ED with worsening of left leg swelling and pain for 2 days. No previous history of DVT or PE.  In the ED, patient was afebrile, hemodynamically stable, maintaining O2 sat on room air Ultrasound duplex left lower extremity was positive for extensive DVT involving the left external iliac vein, left common femoral vein, SFAs on exam, left femoral vein, left proximal profunda vein, left popliteal vein, left posterior tibial veins and left peroneal veins. Given the significant extent of clot burden, vascular surgery was consulted.  Based on her age and comorbidities, surgical intervention was not recommended.  Conservative management with anticoagulation was suggested. Patient was started on heparin drip. Admitted to [facility]  Subjective: Patient was seen and examined this morning.   Lying down in bed.  Improving pain and range of movements of right hand fingers. Daughter at bedside today.  Assessment and plan: Acute extensive DVT of LLE Unprovoked.   Extensive clot extent as above.   For vascular surgery recommendation, patient was started on heparin drip, switch to oral Eliquis ultimately.  Swelling gradually improving.  Continue to keep limb elevated  No evidence of PE and hence CTA was not obtained. Since he has been started on Eliquis, I have stopped baby aspirin.  Acute flare of of osteoarthritis History of osteoarthritis and flares of times.   Right hand was noted to have multijoint swelling, tenderness.  It is suspicious for a flare of osteoarthritis.   Uric acid level not elevated. 3/10, right hand x-ray showed advanced degenerative changes in the lateral carpus and first carpometacarpal joint, advanced degenerative changes in scattered PIP and DIP joints. Deformity of the fifth metacarpal suggest prior healed trauma.  Patient was started on Tylenol.  Gradually improving pain and functionality.  Continue scheduled Tylenol.  A low-dose tramadol was given as well yesterday but patient became lethargic after that and hence it has been discontinued after discussion with the family.  Type 2 diabetes mellitus A1c 6 in 2022 Diet controlled.  Glucose level in blood works remain consistently controlled.  Essential hypertension Continue ramipril  CKD stage IIIa Euvolemic, creatinine level stable Adequately hydrated.  Impaired mobility/falls Per daughter, patient is having more frequent falls lately.  She uses rollator to move around and has a wheelchair as well but prefers not to use it. We have discussed about the risk of life-threatening/fatal fall while on anticoagulation. PT eval obtained.  SNF recommended  UTI  3/10, family reported weakness, and burning urination.  Urinalysis was noted to be abnormal.  Started on 3 days of Keflex.   Goals of care   Code Status: DNR   Wounds:  -    Discharge Exam:   Vitals:   07/02/22 1431 07/02/22 1953 07/03/22 0427 07/03/22 0710  BP: 128/66 (!) 160/86 (!) 147/85 (!) 159/93  Pulse: 99 93 92 90  Resp:  13 17   Temp: 98.1 F (36.7 C) 98.7 F (37.1 C) 98.2 F (36.8 C) (!) 97.5 F (36.4 C)  TempSrc: Oral Oral Oral Oral  SpO2: 97% 97% 97% 98%  Weight:  67.5 kg    Height:  Body mass index is 26.36 kg/m.  General exam: Pleasant, elderly Caucasian female.  Not in pain Skin: No rashes, lesions or ulcers. HEENT: Atraumatic, normocephalic, no obvious bleeding Lungs: Clear to auscultation bilaterally CVS: Regular rate and rhythm, no murmur GI/Abd soft, nontender, nondistended, bowel  sound present CNS: Alert, awake, oriented x 3, hard of hearing Psychiatry: Mood appropriate Extremities: Left lower extremity pedal edema improving.  Calf tenderness present on the left.  Right hand with swelling and tenderness in multiple joints.  Follow ups:    Contact information for follow-up providers     Josetta Huddle, MD Follow up.   Specialty: Internal Medicine Contact information: [address] [place] [address] [phone]              Contact information for after-discharge care     Destination     [facility], [place] Preferred SNF .   Service: Skilled Nursing Contact information: [place] [phone]                     Discharge Instructions:   Discharge Instructions     Call MD for:  difficulty breathing, headache or visual disturbances   Complete by: As directed    Call MD for:  extreme fatigue   Complete by: As directed    Call MD for:  hives   Complete by: As directed    Call MD for:  persistant dizziness or light-headedness   Complete by: As directed    Call MD for:  persistant nausea and vomiting   Complete by: As directed    Call MD for:  severe uncontrolled pain   Complete by: As directed    Call MD for:  temperature >100.4   Complete by: As directed    Diet general   Complete by: As directed    Discharge instructions   Complete by: As directed    General discharge instructions: Follow with Primary MD Josetta Huddle, MD in 7 days  Please request your PCP  to go over your hospital tests, procedures, radiology results at the follow up. Please get your medicines reviewed and adjusted.  Your PCP may decide to repeat certain labs or tests as needed. Do not drive, operate heavy machinery, perform activities at heights, swimming or participation in water activities or provide baby sitting services if your were admitted for syncope or siezures until you  have seen by Primary MD or a Neurologist and advised to do so again. [place] Controlled Substance Reporting System database was reviewed. Do not drive, operate heavy machinery, perform activities at heights, swim, participate in water activities or provide baby-sitting services while on medications for pain, sleep and mood until your outpatient physician has reevaluated you and advised to do so again.  You are strongly recommended to comply with the dose, frequency and duration of prescribed medications. Activity: As tolerated with Full fall precautions use walker/cane & assistance as needed Avoid using any recreational substances like cigarette, tobacco, alcohol, or non-prescribed drug. If you experience worsening of your admission symptoms, develop shortness of breath, life threatening emergency, suicidal or homicidal thoughts you must seek medical attention immediately by calling 911 or calling your MD immediately  if symptoms less severe. You must read complete instructions/literature along with all the possible adverse reactions/side effects for all the medicines you take and that have been prescribed to you. Take any new medicine only after you have completely understood and  accepted all the possible adverse reactions/side effects.  Wear Seat belts while driving. You were cared for by a hospitalist during your hospital stay. If you have any questions about your discharge medications or the care you received while you were in the hospital after you are discharged, you can call the unit and ask to speak with the hospitalist or the covering physician. Once you are discharged, your primary care physician will handle any further medical issues. Please note that NO REFILLS for any discharge medications will be authorized once you are discharged, as it is imperative that you return to your primary care physician (or establish a relationship with a primary care physician if you do not have one).    Increase activity slowly   Complete by: As directed        Discharge Medications:   Allergies as of 07/03/2022       Reactions   Codeine Nausea And Vomiting   Statins Other (See Comments)   Pt does not remember the reaction        Medication List     STOP taking these medications    aspirin EC 81 MG tablet       TAKE these medications    Accu-Chek Guide test strip Generic drug: glucose blood Use to check blood sugar 1 time daily   Accu-Chek Guide w/Device Kit 1 each by Does not apply route daily. Use to check blood sugar daily as needed.  Dx: E11.9   Accu-Chek Softclix Lancets lancets CHECK BLOOD SUGAR 1 TIME A DAY. DX: E11.9   acetaminophen 500 MG tablet Commonly known as: TYLENOL Take 2 tablets (1,000 mg total) by mouth 3 (three) times daily.   apixaban 5 MG Tabs tablet Commonly known as: ELIQUIS Take 2 tablets (10 mg total) by mouth 2 (two) times daily for 7 days.   apixaban 5 MG Tabs tablet Commonly known as: ELIQUIS Take 1 tablet (5 mg total) by mouth 2 (two) times daily. Start taking on: July 06, 2022   azelastine 0.1 % nasal spray Commonly known as: ASTELIN Place 1 spray into both nostrils 2 (two) times daily. Use in each nostril as directed   cephALEXin 500 MG capsule Commonly known as: KEFLEX Take 1 capsule (500 mg total) by mouth every 8 (eight) hours for 3 days.   fluticasone 50 MCG/ACT nasal spray Commonly known as: FLONASE PLACE 2 SPRAYS INTO BOTH NOSTRILS DAILY AS NEEDED FOR ALLERGIES.   Loratadine 10 MG Caps Take 1 capsule by mouth daily.   MULTIVITAMIN PO Take 1 tablet by mouth daily.   Myrbetriq 25 MG Tb24 tablet Generic drug: mirabegron ER Take 25 mg by mouth daily.   ramipril 5 MG capsule Commonly known as: ALTACE TAKE 1 CAPSULE BY MOUTH EVERY DAY   SALINE NASAL SPRAY NA Place 2 sprays into the nose every 6 (six) hours as needed (nasal stuffiness).   Systane Balance 0.6 % Soln Generic drug: Propylene Glycol Place  1-2 drops into both eyes daily.         The results of significant diagnostics from this hospitalization (including imaging, microbiology, ancillary and laboratory) are listed below for reference.    Procedures and Diagnostic Studies:   VAS [place] LOWER EXTREMITY VENOUS (DVT) (ONLY [facility] & [facility])  Result Date: 06/28/2022  Lower Venous DVT Study Patient Name:  [name] [facility]  Date of Exam:   06/28/2022 Medical Rec #: [id number]         Accession #:    [id number]  Date of Birth: April 05, 1923         Patient Gender: F Patient Age:   53 years Exam Location:  [facility] Procedure:      VAS [place] LOWER EXTREMITY VENOUS (DVT) Referring Phys: Regan Lemming --------------------------------------------------------------------------------  Indications: Swelling, SOB, Pain, and multiple recent falls.  Performing Technologist: McKayla Maag RVT, [place]  Examination Guidelines: A complete evaluation includes B-mode imaging, spectral Doppler, color Doppler, and power Doppler as needed of all accessible portions of each vessel. Bilateral testing is considered an integral part of a complete examination. Limited examinations for reoccurring indications may be performed as noted. The reflux portion of the exam is performed with the patient in reverse Trendelenburg.  +-----+---------------+---------+-----------+----------+--------------+ RIGHTCompressibilityPhasicitySpontaneityPropertiesThrombus Aging +-----+---------------+---------+-----------+----------+--------------+ CFV  Full           Yes      Yes                                 +-----+---------------+---------+-----------+----------+--------------+ SFJ  Full                                                        +-----+---------------+---------+-----------+----------+--------------+   +---------+---------------+---------+-----------+----------+-------------------+ LEFT     CompressibilityPhasicitySpontaneityPropertiesThrombus Aging       +---------+---------------+---------+-----------+----------+-------------------+ CFV      None           No       No                   Acute               +---------+---------------+---------+-----------+----------+-------------------+ SFJ      None           No       No                   Acute               +---------+---------------+---------+-----------+----------+-------------------+ FV Prox  None           No       No                   Acute               +---------+---------------+---------+-----------+----------+-------------------+ FV Mid   None           No       No                   Acute               +---------+---------------+---------+-----------+----------+-------------------+ FV DistalNone           No       No                   Acute               +---------+---------------+---------+-----------+----------+-------------------+ PFV      None           No       No                   Acute               +---------+---------------+---------+-----------+----------+-------------------+ POP  None           No       No                   Acute               +---------+---------------+---------+-----------+----------+-------------------+ PTV      None           No       No                   Acute               +---------+---------------+---------+-----------+----------+-------------------+ PERO     None           No       No                   Acute               +---------+---------------+---------+-----------+----------+-------------------+ GSV      None           No       No                   Acute SVT junction                                                        to knee             +---------+---------------+---------+-----------+----------+-------------------+ EIV      None           No       No                   Acute               +---------+---------------+---------+-----------+----------+-------------------+  Unable to visualize IVC and iliac veins due to patient body habitus and not being NPO.    Summary: RIGHT: - No evidence of common femoral vein obstruction.  LEFT: - Findings consistent with acute deep vein thrombosis involving the external iliac vein, left common femoral vein, SF junction, left femoral vein, left proximal profunda vein, left popliteal vein, left posterior tibial veins, and left peroneal veins. - Findings consistent with acute superficial vein thrombosis involving the left great saphenous vein. - No cystic structure found in the popliteal fossa.  *See table(s) above for measurements and observations. Electronically signed by Jamelle Haring on 06/28/2022 at 4:12:09 PM.    Final      Labs:   Basic Metabolic Panel: Recent Labs  Lab 06/28/22 1322 06/29/22 0415  NA 134* 136  K 4.6 4.5  CL 102 105  CO2 19* 23  GLUCOSE 105* 109*  BUN 48* 40*  CREATININE 1.47* 1.28*  CALCIUM 9.2 9.1   GFR Estimated Creatinine Clearance: 23.2 mL/min (A) (by C-G formula based on SCr of 1.28 mg/dL (H)). Liver Function Tests: No results for input(s): "AST", "ALT", "ALKPHOS", "BILITOT", "PROT", "ALBUMIN" in the last 168 hours. No results for input(s): "LIPASE", "AMYLASE" in the last 168 hours. No results for input(s): "AMMONIA" in the last 168 hours. Coagulation profile No results for input(s): "INR", "PROTIME" in the last 168 hours.  CBC: Recent Labs  Lab 06/28/22 1322 06/29/22 0415 06/30/22 0418 07/01/22 0422 07/02/22 0459  WBC 12.5*  9.6 15.5* 14.3* 14.0*  NEUTROABS 10.2*  --   --   --   --   HGB 12.2 11.8* 11.9* 11.4* 11.0*  HCT 38.4 34.9* 35.1* 34.4* 33.4*  MCV 99.2 95.4 95.4 95.8 96.5  PLT 278 264 283 324 338   Cardiac Enzymes: No results for input(s): "CKTOTAL", "CKMB", "CKMBINDEX", "TROPONINI" in the last 168 hours. BNP: Invalid input(s): "POCBNP" CBG: No results for input(s): "GLUCAP" in the last 168 hours. D-Dimer No results for input(s): "DDIMER" in the last 72 hours. Hgb  A1c No results for input(s): "HGBA1C" in the last 72 hours. Lipid Profile No results for input(s): "CHOL", "HDL", "LDLCALC", "TRIG", "CHOLHDL", "LDLDIRECT" in the last 72 hours. Thyroid function studies No results for input(s): "TSH", "T4TOTAL", "T3FREE", "THYROIDAB" in the last 72 hours.  Invalid input(s): "FREET3" Anemia work up No results for input(s): "VITAMINB12", "FOLATE", "FERRITIN", "TIBC", "IRON", "RETICCTPCT" in the last 72 hours. Microbiology No results found for this or any previous visit (from the past 240 hour(s)).  Time coordinating discharge: 35 minutes  Signed: Stasha Naraine  [facility] 07/03/2022, 1:23 PM

## 2022-07-03 NOTE — TOC Progression Note (Signed)
Transition of Care [facility]) - Progression Note    Patient Details  Name: [name] MRN: [id number] Date of Birth: 04-19-1923  Transition of Care [facility]) CM/SW Contact  [name], LCSW Phone Number: 07/03/2022, 11:47 AM  Clinical Narrative:  Bed offer made by [facility].  CSW spoke with pt and daughter [name] and they do want to accept this offer.  [facility] confirmed they can accept pt today.  MD informed.      Expected Discharge Plan: [place] Barriers to Discharge: Continued Medical Work up, SNF Pending bed offer  Expected Discharge Plan and Services In-house Referral: Clinical Social Work   Post Acute Care Choice: [place] Living arrangements for the past 2 months: [place] [employer])                                       Social Determinants of Health (SDOH) Interventions SDOH Screenings   Food Insecurity: No Food Insecurity (06/28/2022)  Housing: Low Risk  (06/28/2022)  Transportation Needs: No Transportation Needs (06/28/2022)  Utilities: Not At Risk (06/28/2022)  Alcohol Screen: Low Risk  (10/13/2020)  Depression (PHQ2-9): Low Risk  (10/13/2020)  Financial Resource Strain: Low Risk  (10/13/2020)  Physical Activity: Inactive (10/13/2020)  Stress: No Stress Concern Present (10/13/2020)  Tobacco Use: Medium Risk (06/28/2022)    Readmission Risk Interventions     No data to display

## 2022-07-11 ENCOUNTER — Ambulatory Visit: Payer: Medicare Other | Admitting: Dermatology

## 2022-10-17 ENCOUNTER — Encounter: Payer: Self-pay | Admitting: Dermatology

## 2022-10-17 ENCOUNTER — Ambulatory Visit (INDEPENDENT_AMBULATORY_CARE_PROVIDER_SITE_OTHER): Payer: Medicare Other | Admitting: Dermatology

## 2022-10-17 VITALS — BP 107/68 | HR 92

## 2022-10-17 DIAGNOSIS — W908XXA Exposure to other nonionizing radiation, initial encounter: Secondary | ICD-10-CM | POA: Diagnosis not present

## 2022-10-17 DIAGNOSIS — L821 Other seborrheic keratosis: Secondary | ICD-10-CM | POA: Diagnosis not present

## 2022-10-17 DIAGNOSIS — C44311 Basal cell carcinoma of skin of nose: Secondary | ICD-10-CM

## 2022-10-17 DIAGNOSIS — L578 Other skin changes due to chronic exposure to nonionizing radiation: Secondary | ICD-10-CM | POA: Diagnosis not present

## 2022-10-17 DIAGNOSIS — Z85828 Personal history of other malignant neoplasm of skin: Secondary | ICD-10-CM

## 2022-10-17 DIAGNOSIS — D489 Neoplasm of uncertain behavior, unspecified: Secondary | ICD-10-CM

## 2022-10-17 DIAGNOSIS — L820 Inflamed seborrheic keratosis: Secondary | ICD-10-CM | POA: Diagnosis not present

## 2022-10-17 DIAGNOSIS — Z86007 Personal history of in-situ neoplasm of skin: Secondary | ICD-10-CM

## 2022-10-17 DIAGNOSIS — Z872 Personal history of diseases of the skin and subcutaneous tissue: Secondary | ICD-10-CM

## 2022-10-17 NOTE — Patient Instructions (Addendum)
   Biopsy Wound Care Instructions  Leave the original bandage on for 24 hours if possible.  If the bandage becomes soaked or soiled before that time, it is OK to remove it and examine the wound.  A small amount of post-operative bleeding is normal.  If excessive bleeding occurs, remove the bandage, place gauze over the site and apply continuous pressure (no peeking) over the area for 30 minutes. If this does not work, please call our clinic as soon as possible or page your doctor if it is after hours.   Once a day, cleanse the wound with soap and water. It is fine to shower. If a thick crust develops you may use a Q-tip dipped into dilute hydrogen peroxide (mix 1:1 with water) to dissolve it.  Hydrogen peroxide can slow the healing process, so use it only as needed.    After washing, apply petroleum jelly (Vaseline) or an antibiotic ointment if your doctor prescribed one for you, followed by a bandage.    For best healing, the wound should be covered with a layer of ointment at all times. If you are not able to keep the area covered with a bandage to hold the ointment in place, this may mean re-applying the ointment several times a day.  Continue this wound care until the wound has healed and is no longer open.   Itching and mild discomfort is normal during the healing process. However, if you develop pain or severe itching, please call our office.   If you have any discomfort, you can take Tylenol (acetaminophen) or ibuprofen as directed on the bottle. (Please do not take these if you have an allergy to them or cannot take them for another reason).  Some redness, tenderness and white or yellow material in the wound is normal healing.  If the area becomes very sore and red, or develops a thick yellow-green material (pus), it may be infected; please notify us.    If you have stitches, return to clinic as directed to have the stitches removed. You will continue wound care for 2-3 days after the  stitches are removed.   Wound healing continues for up to one year following surgery. It is not unusual to experience pain in the scar from time to time during the interval.  If the pain becomes severe or the scar thickens, you should notify the office.    A slight amount of redness in a scar is expected for the first six months.  After six months, the redness will fade and the scar will soften and fade.  The color difference becomes less noticeable with time.  If there are any problems, return for a post-op surgery check at your earliest convenience.  To improve the appearance of the scar, you can use silicone scar gel, cream, or sheets (such as Mederma or Serica) every night for up to one year. These are available over the counter (without a prescription).  Please call our office at [phone] for any questions or concerns.     Due to recent changes in healthcare laws, you may see results of your pathology and/or laboratory studies on MyChart before the doctors have had a chance to review them. We understand that in some cases there may be results that are confusing or concerning to you. Please understand that not all results are received at the same time and often the doctors may need to interpret multiple results in order to provide you with the best plan of   care or course of treatment. Therefore, we ask that you please give us 2 business days to thoroughly review all your results before contacting the office for clarification. Should we see a critical lab result, you will be contacted sooner.   If You Need Anything After Your Visit  If you have any questions or concerns for your doctor, please call our main line at [phone] and press option 4 to reach your doctor's medical assistant. If no one answers, please leave a voicemail as directed and we will return your call as soon as possible. Messages left after 4 pm will be answered the following business day.   You may also send us a  message via MyChart. We typically respond to MyChart messages within 1-2 business days.  For prescription refills, please ask your pharmacy to contact our office. Our fax number is [phone].  If you have an urgent issue when the clinic is closed that cannot wait until the next business day, you can page your doctor at the number below.    Please note that while we do our best to be available for urgent issues outside of office hours, we are not available 24/7.   If you have an urgent issue and are unable to reach us, you may choose to seek medical care at your doctor's office, retail clinic, urgent care center, or emergency room.  If you have a medical emergency, please immediately call 911 or go to the emergency department.  Pager Numbers  - Dr. Kowalski: [phone]  - Dr. Moye: [phone]  - Dr. Stewart: [phone]  In the event of inclement weather, please call our main line at [phone] for an update on the status of any delays or closures.  Dermatology Medication Tips: Please keep the boxes that topical medications come in in order to help keep track of the instructions about where and how to use these. Pharmacies typically print the medication instructions only on the boxes and not directly on the medication tubes.   If your medication is too expensive, please contact our office at [phone] option 4 or send us a message through MyChart.   We are unable to tell what your co-pay for medications will be in advance as this is different depending on your insurance coverage. However, we may be able to find a substitute medication at lower cost or fill out paperwork to get insurance to cover a needed medication.   If a prior authorization is required to get your medication covered by your insurance company, please allow us 1-2 business days to complete this process.  Drug prices often vary depending on where the prescription is filled and some pharmacies may offer  cheaper prices.  The website www.goodrx.com contains coupons for medications through different pharmacies. The prices here do not account for what the cost may be with help from insurance (it may be cheaper with your insurance), but the website can give you the price if you did not use any insurance.  - You can print the associated coupon and take it with your prescription to the pharmacy.  - You may also stop by our office during regular business hours and pick up a GoodRx coupon card.  - If you need your prescription sent electronically to a different pharmacy, notify our office through [facility] MyChart or by phone at [phone] option 4.     Si Usted Necesita Algo Despus de Su Visita  Tambin puede enviarnos un mensaje a travs de MyChart. Por lo general   respondemos a los mensajes de MyChart en el transcurso de 1 a 2 das hbiles.  Para renovar recetas, por favor pida a su farmacia que se ponga en contacto con nuestra oficina. Nuestro nmero de fax es el [phone].  Si tiene un asunto urgente cuando la clnica est cerrada y que no puede esperar hasta el siguiente da hbil, puede llamar/localizar a su doctor(a) al nmero que aparece a continuacin.   Por favor, tenga en cuenta que aunque hacemos todo lo posible para estar disponibles para asuntos urgentes fuera del horario de oficina, no estamos disponibles las 24 horas del da, los 7 das de la semana.   Si tiene un problema urgente y no puede comunicarse con nosotros, puede optar por buscar atencin mdica  en el consultorio de su doctor(a), en una clnica privada, en un centro de atencin urgente o en una sala de emergencias.  Si tiene una emergencia mdica, por favor llame inmediatamente al 911 o vaya a la sala de emergencias.  Nmeros de bper  - Dr. Kowalski: [phone]  - Dra. Moye: [phone]  - Dra. Stewart: [phone]  En caso de inclemencias del tiempo, por favor llame a nuestra lnea principal al  [phone] para una actualizacin sobre el estado de cualquier retraso o cierre.  Consejos para la medicacin en dermatologa: Por favor, guarde las cajas en las que vienen los medicamentos de uso tpico para ayudarle a seguir las instrucciones sobre dnde y cmo usarlos. Las farmacias generalmente imprimen las instrucciones del medicamento slo en las cajas y no directamente en los tubos del medicamento.   Si su medicamento es muy caro, por favor, pngase en contacto con nuestra oficina llamando al [phone] y presione la opcin 4 o envenos un mensaje a travs de MyChart.   No podemos decirle cul ser su copago por los medicamentos por adelantado ya que esto es diferente dependiendo de la cobertura de su seguro. Sin embargo, es posible que podamos encontrar un medicamento sustituto a menor costo o llenar un formulario para que el seguro cubra el medicamento que se considera necesario.   Si se requiere una autorizacin previa para que su compaa de seguros cubra su medicamento, por favor permtanos de 1 a 2 das hbiles para completar este proceso.  Los precios de los medicamentos varan con frecuencia dependiendo del lugar de dnde se surte la receta y alguna farmacias pueden ofrecer precios ms baratos.  El sitio web www.goodrx.com tiene cupones para medicamentos de diferentes farmacias. Los precios aqu no tienen en cuenta lo que podra costar con la ayuda del seguro (puede ser ms barato con su seguro), pero el sitio web puede darle el precio si no utiliz ningn seguro.  - Puede imprimir el cupn correspondiente y llevarlo con su receta a la farmacia.  - Tambin puede pasar por nuestra oficina durante el horario de atencin regular y recoger una tarjeta de cupones de GoodRx.  - Si necesita que su receta se enve electrnicamente a una farmacia diferente, informe a nuestra oficina a travs de MyChart de [facility] o por telfono llamando al [phone] y presione la opcin 4.  

## 2022-10-17 NOTE — Progress Notes (Signed)
Follow-Up Visit   Subjective  [name] is a 89 y.o. female who presents for the following: hx of aks, hx of bcc, here with daughter today to recheck some spots treated with Ln2. Daughter reports a spot at right nose that is crusty and will not go away.  This is same spot that was biopsy proven BCC and treated with radiation completed 4/23.  She also has other spots at neck that she picks at.  The following portions of the chart were reviewed this encounter and updated as appropriate: medications, allergies, medical history  Review of Systems:  No other skin or systemic complaints except as noted in HPI or Assessment and Plan.  Objective  Well appearing patient in no apparent distress; mood and affect are within normal limits.   A focused examination was performed of the following areas: face, ears, hands, arms, legs, nose and neck    Relevant exam findings are noted in the Assessment and Plan.  left inferior jaw x 2, right postauricular x 2 (4) Erythematous stuck-on, waxy papule or plaque  Right Nasal Sidewall 10 x 0.6 mm crusted ulcerated papule        Assessment & Plan    SEBORRHEIC KERATOSIS - Stuck-on, waxy, tan-brown papules and/or plaques  - Benign-appearing - Discussed benign etiology and prognosis. - Observe - Call for any changes   ACTINIC DAMAGE - chronic, secondary to cumulative UV radiation exposure/sun exposure over time - diffuse scaly erythematous macules with underlying dyspigmentation - Recommend daily broad spectrum sunscreen SPF 30+ to sun-exposed areas, reapply every 2 hours as needed.  - Recommend staying in the shade or wearing long sleeves, sun glasses (UVA+UVB protection) and wide brim hats (4-inch brim around the entire circumference of the hat). - Call for new or changing lesions.  Inflamed seborrheic keratosis (4) left inferior jaw x 2, right postauricular x 2  Symptomatic, irritating, patient would like treated.  Destruction  of lesion - left inferior jaw x 2, right postauricular x 2  Destruction method: cryotherapy   Informed consent: discussed and consent obtained   Lesion destroyed using liquid nitrogen: Yes   Region frozen until ice ball extended beyond lesion: Yes   Outcome: patient tolerated procedure well with no complications   Post-procedure details: wound care instructions given   Additional details:  Prior to procedure, discussed risks of blister formation, small wound, skin dyspigmentation, or rare scar following cryotherapy. Recommend Vaseline ointment to treated areas while healing.   Neoplasm of uncertain behavior Right Nasal Sidewall  Skin / nail biopsy Type of biopsy: tangential   Informed consent: discussed and consent obtained   Patient was prepped and draped in usual sterile fashion: Area prepped with alcohol. Anesthesia: the lesion was anesthetized in a standard fashion   Anesthetic:  1% lidocaine w/ epinephrine 1-100,000 buffered w/ 8.4% NaHCO3 Instrument used: flexible razor blade   Hemostasis achieved with: pressure, aluminum chloride and electrodesiccation   Outcome: patient tolerated procedure well   Post-procedure details: wound care instructions given   Post-procedure details comment:  Ointment and small bandage applied  Specimen 1 - Surgical pathology Differential Diagnosis: R/o recurrent bcc   Check Margins: yes  Refer to previous pathology  05/18/21 Accession: [id number]  R/o recurrent bcc    Reference pathology 05/18/21 Accession: [id number] Bx proven BCC at right nasal sidewall  Discussed Mohs surgery or possible EDC but higher risk of recurrence   HISTORY OF SQUAMOUS CELL CARCINOMA OF THE SKIN - No evidence of recurrence today -  Recommend regular full body skin exams - Recommend daily broad spectrum sunscreen SPF 30+ to sun-exposed areas, reapply every 2 hours as needed.  - Call if any new or changing lesions are noted between office visits  HISTORY OF  SQUAMOUS CELL CARCINOMA IN SITU OF THE SKIN - No evidence of recurrence today - Recommend regular full body skin exams - Recommend daily broad spectrum sunscreen SPF 30+ to sun-exposed areas, reapply every 2 hours as needed.  - Call if any new or changing lesions are noted between office visits   Return if symptoms worsen or fail to improve, for pending biopsy.  I, Asher Muir, CMA, am acting as scribe for [name], MD.   Documentation: I have reviewed the above documentation for accuracy and completeness, and I agree with the above.  [name], MD

## 2022-10-23 ENCOUNTER — Telehealth: Payer: Self-pay

## 2022-10-23 NOTE — Telephone Encounter (Signed)
Advised pts daughter Claris Che of bx results.  Claris Che will call back tomorrow to schedule EDC./sh

## 2022-10-23 NOTE — Telephone Encounter (Signed)
-----   Message from [name], MD sent at 10/23/2022  2:51 PM EDT ----- Skin , right nasal sidewall BASAL CELL CARCINOMA, NODULAR AND INFILTRATIVE PATTERNS, PERIPHERAL AND DEEP MARGINS INVOLVED  BCC skin cancer- recommend EDC.  If recurs, recommend Mohs surgery   - please call patient

## 2022-11-01 ENCOUNTER — Encounter: Payer: Self-pay | Admitting: Dermatology

## 2022-11-01 ENCOUNTER — Ambulatory Visit (INDEPENDENT_AMBULATORY_CARE_PROVIDER_SITE_OTHER): Payer: Medicare Other | Admitting: Dermatology

## 2022-11-01 VITALS — BP 123/77 | HR 100

## 2022-11-01 DIAGNOSIS — W908XXA Exposure to other nonionizing radiation, initial encounter: Secondary | ICD-10-CM

## 2022-11-01 DIAGNOSIS — C44311 Basal cell carcinoma of skin of nose: Secondary | ICD-10-CM

## 2022-11-01 DIAGNOSIS — L578 Other skin changes due to chronic exposure to nonionizing radiation: Secondary | ICD-10-CM

## 2022-11-01 DIAGNOSIS — L820 Inflamed seborrheic keratosis: Secondary | ICD-10-CM | POA: Diagnosis not present

## 2022-11-01 MED ORDER — MUPIROCIN 2 % EX OINT: TOPICAL_OINTMENT | CUTANEOUS | 1 refills | Status: DC

## 2022-11-01 MED ORDER — MUPIROCIN 2 % EX OINT: TOPICAL_OINTMENT | CUTANEOUS | 1 refills | Status: AC

## 2022-11-01 NOTE — Progress Notes (Signed)
   Follow-Up Visit   Subjective  [name] is a 89 y.o. female who presents for the following: treatment of BCC, nodular. Right nasal sidewall. Bx: 10/17/2022. Lesion was not cleared by radiation therapy. Here for [facility].  The following portions of the chart were reviewed this encounter and updated as appropriate: medications, allergies, medical history  Review of Systems:  No other skin or systemic complaints except as noted in HPI or Assessment and Plan.  Objective  Well appearing patient in no apparent distress; mood and affect are within normal limits.  A focused examination was performed of the following areas: Face, nose  Relevant exam findings are noted in the Assessment and Plan.  right nasal sidewall Pink healing biopsy site  left lateral eyebrow x1 Erythematous keratotic or waxy stuck-on papule or plaque.    Assessment & Plan     Basal cell carcinoma (BCC) of skin of nose right nasal sidewall  Destruction of lesion  Destruction method: electrodesiccation and curettage   Informed consent: discussed and consent obtained   Timeout:  patient name, date of birth, surgical site, and procedure verified Procedure prep:  Patient was prepped and draped in usual sterile fashion Prep type:  Isopropyl alcohol Anesthesia: the lesion was anesthetized in a standard fashion   Anesthetic:  1% lidocaine w/ epinephrine 1-100,000 buffered w/ 8.4% NaHCO3 Curettage performed in three different directions: Yes   Electrodesiccation performed over the curetted area: Yes   Final wound size (cm):  1.3 Hemostasis achieved with:  pressure, aluminum chloride and electrodesiccation Outcome: patient tolerated procedure well with no complications   Post-procedure details: sterile dressing applied and wound care instructions given   Dressing type: bandage   Additional details:  Mupirocin ointment and Bandaid applied   mupirocin ointment (BACTROBAN) 2 % Apply once daily with bandage  change  Discussed if BCC recurs, she will need Mohs surgery.  Inflamed seborrheic keratosis left lateral eyebrow x1  Symptomatic, irritating, patient would like treated.  Destruction of lesion - left lateral eyebrow x1  Destruction method: cryotherapy   Informed consent: discussed and consent obtained   Lesion destroyed using liquid nitrogen: Yes   Region frozen until ice ball extended beyond lesion: Yes   Outcome: patient tolerated procedure well with no complications   Post-procedure details: wound care instructions given   Additional details:  Prior to procedure, discussed risks of blister formation, small wound, skin dyspigmentation, or rare scar following cryotherapy. Recommend Vaseline ointment to treated areas while healing.   Actinic skin damage   ACTINIC DAMAGE - chronic, secondary to cumulative UV radiation exposure/sun exposure over time - diffuse scaly erythematous macules with underlying dyspigmentation - Recommend daily broad spectrum sunscreen SPF 30+ to sun-exposed areas, reapply every 2 hours as needed.  - Recommend staying in the shade or wearing long sleeves, sun glasses (UVA+UVB protection) and wide brim hats (4-inch brim around the entire circumference of the hat). - Call for new or changing lesions.   Return for [facility] Follow up in 3-4 months.  I, Lawson Radar, CMA, am acting as scribe for [name], MD.   Documentation: I have reviewed the above documentation for accuracy and completeness, and I agree with the above.  [name], MD

## 2022-11-01 NOTE — Patient Instructions (Addendum)
Wound Care Instructions  Cleanse wound gently with soap and water once a day then pat dry with clean gauze. Apply a thin coat of Mupirocin over the wound.  Do not pick or remove scabs. Do not remove the yellow or white "healing tissue" from the base of the wound.  Cover the wound with fresh, clean, nonstick gauze and secure with paper tape. You may use Band-Aids in place of gauze and tape if the wound is small enough, but would recommend trimming much of the tape off as there is often too much. Sometimes Band-Aids can irritate the skin.  You should call the office for your biopsy report after 1 week if you have not already been contacted.  If you experience any problems, such as abnormal amounts of bleeding, swelling, significant bruising, significant pain, or evidence of infection, please call the office immediately.  FOR ADULT SURGERY PATIENTS: If you need something for pain relief you may take 1 extra strength Tylenol (acetaminophen) AND 2 Ibuprofen (200mg  each) together every 4 hours as needed for pain. (do not take these if you are allergic to them or if you have a reason you should not take them.) Typically, you may only need pain medication for 1 to 3 days.    Cryotherapy Aftercare  Wash gently with soap and water everyday.   Apply Vaseline daily until healed.    Due to recent changes in healthcare laws, you may see results of your pathology and/or laboratory studies on MyChart before the doctors have had a chance to review them. We understand that in some cases there may be results that are confusing or concerning to you. Please understand that not all results are received at the same time and often the doctors may need to interpret multiple results in order to provide you with the best plan of care or course of treatment. Therefore, we ask that you please give [place] 2 business days to thoroughly review all your results before contacting the office for clarification. Should we see a critical  lab result, you will be contacted sooner.   If You Need Anything After Your Visit  If you have any questions or concerns for your doctor, please call our main line at [phone] and press option 4 to reach your doctor's medical assistant. If no one answers, please leave a voicemail as directed and we will return your call as soon as possible. Messages left after 4 pm will be answered the following business day.   You may also send [place] a message via MyChart. We typically respond to MyChart messages within 1-2 business days.  For prescription refills, please ask your pharmacy to contact our office. Our fax number is [phone].  If you have an urgent issue when the clinic is closed that cannot wait until the next business day, you can page your doctor at the number below.    Please note that while we do our best to be available for urgent issues outside of office hours, we are not available 24/7.   If you have an urgent issue and are unable to reach [place], you may choose to seek medical care at your doctor's office, retail clinic, urgent care center, or emergency room.  If you have a medical emergency, please immediately call 911 or go to the emergency department.  Pager Numbers  - Dr. Gwen Pounds: [phone]  - Dr. Neale Burly: [phone]  - Dr. Roseanne Reno: [phone]  In the event of inclement weather, please call our main line at [phone]  for an update on the status of any delays or closures.  Dermatology Medication Tips: Please keep the boxes that topical medications come in in order to help keep track of the instructions about where and how to use these. Pharmacies typically print the medication instructions only on the boxes and not directly on the medication tubes.   If your medication is too expensive, please contact our office at [phone] option 4 or send [place] a message through MyChart.   We are unable to tell what your co-pay for medications will be in advance as this is  different depending on your insurance coverage. However, we may be able to find a substitute medication at lower cost or fill out paperwork to get insurance to cover a needed medication.   If a prior authorization is required to get your medication covered by your insurance company, please allow [place] 1-2 business days to complete this process.  Drug prices often vary depending on where the prescription is filled and some pharmacies may offer cheaper prices.  The website www.goodrx.com contains coupons for medications through different pharmacies. The prices here do not account for what the cost may be with help from insurance (it may be cheaper with your insurance), but the website can give you the price if you did not use any insurance.  - You can print the associated coupon and take it with your prescription to the pharmacy.  - You may also stop by our office during regular business hours and pick up a GoodRx coupon card.  - If you need your prescription sent electronically to a different pharmacy, notify our office through [facility] or by phone at [phone] option 4.     Si Usted Necesita Algo Despus de Su Visita  Tambin puede enviarnos un mensaje a travs de Clinical cytogeneticist. Por lo general respondemos a los mensajes de MyChart en el transcurso de 1 a 2 das hbiles.  Para renovar recetas, por favor pida a su farmacia que se ponga en contacto con nuestra oficina. Annie Sable de fax es [place] [phone].  Si tiene un asunto urgente cuando la clnica est cerrada y que no puede esperar hasta el siguiente da hbil, puede llamar/localizar a su doctor(a) al nmero que aparece a continuacin.   Por favor, tenga en cuenta que aunque hacemos todo lo posible para estar disponibles para asuntos urgentes fuera del horario de [place], no estamos disponibles las 24 horas del da, los 7 [address]  [address] de la [place].   Si tiene un problema urgente y no puede comunicarse con nosotros, puede optar por buscar  atencin mdica  en el consultorio de su doctor(a), en una clnica privada, en un centro de atencin urgente o en una sala de emergencias.  Si tiene Engineer, drilling, por favor llame inmediatamente al 911 o vaya a la sala de emergencias.  Nmeros de bper  - Dr. Gwen Pounds: [phone]  - Dra. Moye: [phone]  - Dra. Roseanne Reno: [phone]  En caso de inclemencias del [place], por favor llame a Lacy Duverney principal al [phone] para una actualizacin sobre el [place] de cualquier retraso o cierre.  Consejos para la medicacin en dermatologa: Por favor, guarde las cajas en las que vienen los medicamentos de uso tpico para ayudarle a seguir las instrucciones sobre dnde y cmo usarlos. Las farmacias generalmente imprimen las instrucciones del medicamento slo en las cajas y no directamente en los tubos del [place].   Si su medicamento es [employer], por favor, pngase en contacto con Ferne Coe  oficina llamando al [phone] y presione la opcin 4 o envenos un mensaje a travs de Clinical cytogeneticist.   No podemos decirle cul ser su copago por los medicamentos por adelantado ya que esto es diferente dependiendo de la cobertura de su seguro. Sin embargo, es posible que podamos encontrar un medicamento sustituto a Audiological scientist un formulario para que el seguro cubra el medicamento que se considera necesario.   Si se requiere una autorizacin previa para que su compaa de seguros [place] su medicamento, por favor permtanos de 1 a 2 das hbiles para completar [address]  Los precios de los medicamentos varan con frecuencia dependiendo del Environmental consultant de dnde se surte la receta y alguna farmacias pueden ofrecer precios ms baratos.  El sitio web www.goodrx.com tiene cupones para medicamentos de Health and safety inspector. Los precios aqu no tienen en cuenta lo que podra costar con la ayuda del seguro (puede ser ms barato con su seguro), pero el sitio web puede darle el precio si no utiliz  Tourist information centre manager.  - Puede imprimir el cupn correspondiente y llevarlo con su receta a la farmacia.  - Tambin puede pasar por nuestra oficina durante el horario de atencin regular y Education officer, museum una tarjeta de cupones de GoodRx.  - Si necesita que su receta se enve electrnicamente a una farmacia diferente, informe a nuestra oficina a travs de MyChart de [place] o por telfono llamando al [phone] y presione la opcin 4.

## 2023-02-13 ENCOUNTER — Ambulatory Visit (INDEPENDENT_AMBULATORY_CARE_PROVIDER_SITE_OTHER): Payer: Medicare Other | Admitting: Dermatology

## 2023-02-13 DIAGNOSIS — L821 Other seborrheic keratosis: Secondary | ICD-10-CM | POA: Diagnosis not present

## 2023-02-13 DIAGNOSIS — L578 Other skin changes due to chronic exposure to nonionizing radiation: Secondary | ICD-10-CM | POA: Diagnosis not present

## 2023-02-13 DIAGNOSIS — Z85828 Personal history of other malignant neoplasm of skin: Secondary | ICD-10-CM

## 2023-02-13 DIAGNOSIS — W908XXA Exposure to other nonionizing radiation, initial encounter: Secondary | ICD-10-CM

## 2023-02-13 DIAGNOSIS — L820 Inflamed seborrheic keratosis: Secondary | ICD-10-CM | POA: Diagnosis not present

## 2023-02-13 MED ORDER — TRIAMCINOLONE ACETONIDE 0.1 % EX CREA: TOPICAL_CREAM | CUTANEOUS | 1 refills | Status: AC

## 2023-02-13 NOTE — Patient Instructions (Addendum)
For itchy areas on back Start triamcinolone 0.1 % cream - apply topically 1 to 2 times daily as needed for itch. Avoid applying to face, groin, and axilla. Use as directed. Long-term use can cause thinning of the skin.  Topical steroids (such as triamcinolone, fluocinolone, fluocinonide, mometasone, clobetasol, halobetasol, betamethasone, hydrocortisone) can cause thinning and lightening of the skin if they are used for too long in the same area. Your physician has selected the right strength medicine for your problem and area affected on the body. Please use your medication only as directed by your physician to prevent side effects.   Continue with a daily moisturizer    Gentle Skin Care Guide  1. Bathe no more than once a day.  2. Avoid bathing in hot water  3. Use a mild soap like Dove, Vanicream, Cetaphil, CeraVe. Can use Lever 2000 or Cetaphil antibacterial soap  4. Use soap only where you need it. On most days, use it under your arms, between your legs, and on your feet. Let the water rinse other areas unless visibly dirty.  5. When you get out of the bath/shower, use a towel to gently blot your skin dry, don't rub it.  6. While your skin is still a little damp, apply a moisturizing cream such as Vanicream, CeraVe, Cetaphil, Eucerin, Sarna lotion or plain Vaseline Jelly. For hands apply Neutrogena [place] Hand Cream or Excipial Hand Cream.  7. Reapply moisturizer any time you start to itch or feel dry.  8. Sometimes using free and clear laundry detergents can be helpful. Fabric softener sheets should be avoided. Downy Free & Gentle liquid, or any liquid fabric softener that is free of dyes and perfumes, it acceptable to use  9. If your doctor has given you prescription creams you may apply moisturizers over them      Seborrheic Keratosis  What causes seborrheic keratoses? Seborrheic keratoses are harmless, common skin growths that first appear during adult life.  As time  goes by, more growths appear.  Some people may develop a large number of them.  Seborrheic keratoses appear on both covered and uncovered body parts.  They are not caused by sunlight.  The tendency to develop seborrheic keratoses can be inherited.  They vary in color from skin-colored to gray, brown, or even black.  They can be either smooth or have a rough, warty surface.   Seborrheic keratoses are superficial and look as if they were stuck on the skin.  Under the microscope this type of keratosis looks like layers upon layers of skin.  That is why at times the top layer may seem to fall off, but the rest of the growth remains and re-grows.    Treatment Seborrheic keratoses do not need to be treated, but can easily be removed in the office.  Seborrheic keratoses often cause symptoms when they rub on clothing or jewelry.  Lesions can be in the way of shaving.  If they become inflamed, they can cause itching, soreness, or burning.  Removal of a seborrheic keratosis can be accomplished by freezing, burning, or surgery. If any spot bleeds, scabs, or grows rapidly, please return to have it checked, as these can be an indication of a skin cancer.  Cryotherapy Aftercare  Wash gently with soap and water everyday.   Apply Vaseline and Band-Aid daily until healed.     Due to recent changes in healthcare laws, you may see results of your pathology and/or laboratory studies on MyChart before the doctors  have had a chance to review them. We understand that in some cases there may be results that are confusing or concerning to you. Please understand that not all results are received at the same time and often the doctors may need to interpret multiple results in order to provide you with the best plan of care or course of treatment. Therefore, we ask that you please give [place] 2 business days to thoroughly review all your results before contacting the office for clarification. Should we see a critical lab result, you  will be contacted sooner.   If You Need Anything After Your Visit  If you have any questions or concerns for your doctor, please call our main line at [phone] and press option 4 to reach your doctor's medical assistant. If no one answers, please leave a voicemail as directed and we will return your call as soon as possible. Messages left after 4 pm will be answered the following business day.   You may also send [place] a message via MyChart. We typically respond to MyChart messages within 1-2 business days.  For prescription refills, please ask your pharmacy to contact our office. Our fax number is [phone].  If you have an urgent issue when the clinic is closed that cannot wait until the next business day, you can page your doctor at the number below.    Please note that while we do our best to be available for urgent issues outside of office hours, we are not available 24/7.   If you have an urgent issue and are unable to reach [place], you may choose to seek medical care at your doctor's office, retail clinic, urgent care center, or emergency room.  If you have a medical emergency, please immediately call 911 or go to the emergency department.  Pager Numbers  - Dr. Gwen Pounds: [phone]  - Dr. Roseanne Reno: [phone]  - Dr. Katrinka Blazing: [phone]   In the event of inclement weather, please call our main line at [phone] for an update on the status of any delays or closures.  Dermatology Medication Tips: Please keep the boxes that topical medications come in in order to help keep track of the instructions about where and how to use these. Pharmacies typically print the medication instructions only on the boxes and not directly on the medication tubes.   If your medication is too expensive, please contact our office at [phone] option 4 or send [place] a message through MyChart.   We are unable to tell what your co-pay for medications will be in advance as this is different  depending on your insurance coverage. However, we may be able to find a substitute medication at lower cost or fill out paperwork to get insurance to cover a needed medication.   If a prior authorization is required to get your medication covered by your insurance company, please allow [place] 1-2 business days to complete this process.  Drug prices often vary depending on where the prescription is filled and some pharmacies may offer cheaper prices.  The website www.goodrx.com contains coupons for medications through different pharmacies. The prices here do not account for what the cost may be with help from insurance (it may be cheaper with your insurance), but the website can give you the price if you did not use any insurance.  - You can print the associated coupon and take it with your prescription to the pharmacy.  - You may also stop by our office during regular business hours and pick  up a GoodRx coupon card.  - If you need your prescription sent electronically to a different pharmacy, notify our office through [facility] or by phone at [phone] option 4.     Si Usted Necesita Algo Despus de Su Visita  Tambin puede enviarnos un mensaje a travs de Clinical cytogeneticist. Por lo general respondemos a los mensajes de MyChart en el transcurso de 1 a 2 das hbiles.  Para renovar recetas, por favor pida a su farmacia que se ponga en contacto con nuestra oficina. Annie Sable de fax es [place] [phone].  Si tiene un asunto urgente cuando la clnica est cerrada y que no puede esperar hasta el siguiente da hbil, puede llamar/localizar a su doctor(a) al nmero que aparece a continuacin.   Por favor, tenga en cuenta que aunque hacemos todo lo posible para estar disponibles para asuntos urgentes fuera del horario de [place], no estamos disponibles las 24 horas del da, los 7 [address]  [address] de la [place].   Si tiene un problema urgente y no puede comunicarse con nosotros, puede optar por buscar atencin  mdica  en el consultorio de su doctor(a), en una clnica privada, en un centro de atencin urgente o en una sala de emergencias.  Si tiene Engineer, drilling, por favor llame inmediatamente al 911 o vaya a la sala de emergencias.  Nmeros de bper  - Dr. Gwen Pounds: [phone]  - Dra. Roseanne Reno: [phone]  - Dr. Katrinka Blazing: [phone]   En caso de inclemencias del tiempo, por favor llame a Lacy Duverney principal al [phone] para una actualizacin sobre el [place] de cualquier retraso o cierre.  Consejos para la medicacin en dermatologa: Por favor, guarde las cajas en las que vienen los medicamentos de uso tpico para ayudarle a seguir las instrucciones sobre dnde y cmo usarlos. Las farmacias generalmente imprimen las instrucciones del medicamento slo en las cajas y no directamente en los tubos del [place].   Si su medicamento es muy caro, por favor, pngase en contacto con Rolm Gala llamando al [phone] y presione la opcin 4 o envenos un mensaje a travs de Clinical cytogeneticist.   No podemos decirle cul ser su copago por los medicamentos por adelantado ya que esto es diferente dependiendo de la cobertura de su seguro. Sin embargo, es posible que podamos encontrar un medicamento sustituto a Audiological scientist un formulario para que el seguro cubra el medicamento que se considera necesario.   Si se requiere una autorizacin previa para que su compaa de seguros [place] su medicamento, por favor permtanos de 1 a 2 das hbiles para completar [address]  Los precios de los medicamentos varan con frecuencia dependiendo del Environmental consultant de dnde se surte la receta y alguna farmacias pueden ofrecer precios ms baratos.  El sitio web www.goodrx.com tiene cupones para medicamentos de Health and safety inspector. Los precios aqu no tienen en cuenta lo que podra costar con la ayuda del seguro (puede ser ms barato con su seguro), pero el sitio web puede darle el precio si no utiliz Producer, television/film/video.  - Puede imprimir el cupn correspondiente y llevarlo con su receta a la farmacia.  - Tambin puede pasar por nuestra oficina durante el horario de atencin regular y Education officer, museum una tarjeta de cupones de GoodRx.  - Si necesita que su receta se enve electrnicamente a una farmacia diferente, informe a nuestra oficina a travs de MyChart de [place] o por telfono llamando al [phone] y presione la opcin 4.

## 2023-02-13 NOTE — Progress Notes (Signed)
Follow-Up Visit   Subjective  [name] is a 89 y.o. female who presents for the following: hx of isk at left latera eyebrow hx of bcc at right nasal sidewall The patient has spots, moles and lesions to be evaluated, some may be new or changing and the patient may have concern these could be cancer.  Patient reports with daughter itchy skin at back for about a month. Using Eucerin cream Concerned with darkening of skin on face, and has a lot of itching on back.    The following portions of the chart were reviewed this encounter and updated as appropriate: medications, allergies, medical history  Review of Systems:  No other skin or systemic complaints except as noted in HPI or Assessment and Plan.  Objective  Well appearing patient in no apparent distress; mood and affect are within normal limits.   A focused examination was performed of the following areas: Back, face, neck  Relevant exam findings are noted in the Assessment and Plan.  right medial lower canthus x1 Keratotic tan macule     back x 4 (4) Erythematous stuck-on, waxy papule or plaque    Assessment & Plan     SEBORRHEIC KERATOSIS Face, back, right lower neck - Stuck-on, waxy, tan-brown papules and/or plaques  - Benign-appearing - Discussed benign etiology and prognosis. - Observe - Call for any changes  ACTINIC DAMAGE - chronic, secondary to cumulative UV radiation exposure/sun exposure over time - diffuse scaly erythematous macules with underlying dyspigmentation - Recommend daily broad spectrum sunscreen SPF 30+ to sun-exposed areas, reapply every 2 hours as needed.  - Recommend staying in the shade or wearing long sleeves, sun glasses (UVA+UVB protection) and wide brim hats (4-inch brim around the entire circumference of the hat). - Call for new or changing lesions.   Inflamed seborrheic keratosis (5) back x 4 (4); right medial lower canthus x1  Symptomatic, irritating, patient would  like treated.  (Vs recurrent bcc at R medial canthus) Will treat area at right medial lower canthus with cryotherapy if not resolved will consider bx   For itchy spots at back once healed Start triamcinolone 0.1 cream- apply topically to itchy areas at back qd/bid prn. Avoid applying to face, groin, and axilla. Use as directed. Long-term use can cause thinning of the skin. Topical steroids (such as triamcinolone, fluocinolone, fluocinonide, mometasone, clobetasol, halobetasol, betamethasone, hydrocortisone) can cause thinning and lightening of the skin if they are used for too long in the same area. Your physician has selected the right strength medicine for your problem and area affected on the body. Please use your medication only as directed by your physician to prevent side effects.    Destruction of lesion - right medial lower canthus x1  Destruction method: cryotherapy   Informed consent: discussed and consent obtained   Lesion destroyed using liquid nitrogen: Yes   Region frozen until ice ball extended beyond lesion: Yes   Outcome: patient tolerated procedure well with no complications   Post-procedure details: wound care instructions given   Additional details:  Prior to procedure, discussed risks of blister formation, small wound, skin dyspigmentation, or rare scar following cryotherapy. Recommend Vaseline ointment to treated areas while healing.   Destruction of lesion - back x 4 (4)  Destruction method: cryotherapy   Informed consent: discussed and consent obtained   Lesion destroyed using liquid nitrogen: Yes   Region frozen until ice ball extended beyond lesion: Yes   Outcome: patient tolerated procedure well with  no complications   Post-procedure details: wound care instructions given   Additional details:  Prior to procedure, discussed risks of blister formation, small wound, skin dyspigmentation, or rare scar following cryotherapy. Recommend Vaseline ointment to treated  areas while healing.   triamcinolone cream (KENALOG) 0.1 % - back x 4 (4) Apply topically to itchy areas at back qd/bid prn. Avoid applying to face, groin, and axilla. Use as directed.  HISTORY OF BASAL CELL CARCINOMA OF THE SKIN right nasal sidewall edc 7/24 - No evidence of recurrence today - Recommend regular full body skin exams - Recommend daily broad spectrum sunscreen SPF 30+ to sun-exposed areas, reapply every 2 hours as needed.  - Call if any new or changing lesions are noted between office visits   Return in about 6 months (around 08/14/2023) for recheck nose, back and face, hx of bcc.  I, Asher Muir, CMA, am acting as scribe for [name], MD.   Documentation: I have reviewed the above documentation for accuracy and completeness, and I agree with the above.  [name], MD

## 2023-04-03 ENCOUNTER — Ambulatory Visit: Payer: Medicare Other | Admitting: Podiatry

## 2023-04-09 ENCOUNTER — Ambulatory Visit (INDEPENDENT_AMBULATORY_CARE_PROVIDER_SITE_OTHER): Payer: Medicare Other | Admitting: Podiatry

## 2023-04-09 ENCOUNTER — Encounter: Payer: Self-pay | Admitting: Podiatry

## 2023-04-09 DIAGNOSIS — M79675 Pain in left toe(s): Secondary | ICD-10-CM | POA: Diagnosis not present

## 2023-04-09 DIAGNOSIS — E1142 Type 2 diabetes mellitus with diabetic polyneuropathy: Secondary | ICD-10-CM

## 2023-04-09 DIAGNOSIS — B351 Tinea unguium: Secondary | ICD-10-CM

## 2023-04-09 DIAGNOSIS — E119 Type 2 diabetes mellitus without complications: Secondary | ICD-10-CM | POA: Diagnosis not present

## 2023-04-09 DIAGNOSIS — M79674 Pain in right toe(s): Secondary | ICD-10-CM

## 2023-04-09 NOTE — Progress Notes (Signed)
This patient returns to my office for at risk foot care.  This patient requires this care by a professional since this patient will be at risk due to having diabetic neuropathy. This patient is unable to cut nails herself since the patient cannot reach her nails.These nails are painful walking and wearing shoes.  This patient presents for at risk foot care today. She presents to the office in a wheelchair with her daughter.  She has not been seen in over a year.  General Appearance  Alert, conversant and in no acute stress.  Vascular  Dorsalis pedis and posterior tibial  pulses are  weakly palpable  bilaterally.  Capillary return is within normal limits  bilaterally. Temperature is within normal limits  bilaterally.  Neurologic  Senn-Weinstein monofilament wire test within normal limits  bilaterally. Muscle power within normal limits bilaterally.  Nails Thick disfigured discolored nails with subungual debris  from hallux to fifth toes bilaterally. No evidence of bacterial infection or drainage bilaterally.  Orthopedic  No limitations of motion  feet .  No crepitus or effusions noted.  No bony pathology or digital deformities noted.  Skin  normotropic skin with no porokeratosis noted bilaterally.  No signs of infections or ulcers noted.     Onychomycosis  Pain in right toes  Pain in left toes  Consent was obtained for treatment procedures.   Mechanical debridement of nails 1-5  bilaterally performed with a nail nipper.  Filed with dremel without incident.    Return office visit      3 months                Told patient to return for periodic foot care and evaluation due to potential at risk complications.   Helane Gunther DPM

## 2023-07-13 ENCOUNTER — Emergency Department (HOSPITAL_COMMUNITY)

## 2023-07-13 ENCOUNTER — Emergency Department (HOSPITAL_COMMUNITY)
Admission: EM | Admit: 2023-07-13 | Discharge: 2023-07-14 | Disposition: A | Discharge status: Home / Self Care | Attending: Emergency Medicine | Admitting: Emergency Medicine

## 2023-07-13 DIAGNOSIS — E119 Type 2 diabetes mellitus without complications: Secondary | ICD-10-CM | POA: Insufficient documentation

## 2023-07-13 DIAGNOSIS — Z7901 Long term (current) use of anticoagulants: Secondary | ICD-10-CM | POA: Diagnosis not present

## 2023-07-13 DIAGNOSIS — I10 Essential (primary) hypertension: Secondary | ICD-10-CM | POA: Diagnosis not present

## 2023-07-13 DIAGNOSIS — K8689 Other specified diseases of pancreas: Secondary | ICD-10-CM | POA: Insufficient documentation

## 2023-07-13 DIAGNOSIS — D72829 Elevated white blood cell count, unspecified: Secondary | ICD-10-CM | POA: Insufficient documentation

## 2023-07-13 DIAGNOSIS — E785 Hyperlipidemia, unspecified: Secondary | ICD-10-CM | POA: Diagnosis not present

## 2023-07-13 DIAGNOSIS — Z79899 Other long term (current) drug therapy: Secondary | ICD-10-CM | POA: Diagnosis not present

## 2023-07-13 DIAGNOSIS — K529 Noninfective gastroenteritis and colitis, unspecified: Secondary | ICD-10-CM | POA: Diagnosis not present

## 2023-07-13 DIAGNOSIS — Z86718 Personal history of other venous thrombosis and embolism: Secondary | ICD-10-CM | POA: Diagnosis not present

## 2023-07-13 LAB — COMPREHENSIVE METABOLIC PANEL
ALT: 27 U/L (ref 0–44)
AST: 20 U/L (ref 15–41)
Albumin: 3.3 g/dL — ABNORMAL LOW (ref 3.5–5.0)
Alkaline Phosphatase: 65 U/L (ref 38–126)
Anion gap: 10 (ref 5–15)
BUN: 62 mg/dL — ABNORMAL HIGH (ref 8–23)
CO2: 19 mmol/L — ABNORMAL LOW (ref 22–32)
Calcium: 9.2 mg/dL (ref 8.9–10.3)
Chloride: 111 mmol/L (ref 98–111)
Creatinine, Ser: 1.69 mg/dL — ABNORMAL HIGH (ref 0.44–1.00)
GFR, Estimated: 27 mL/min — ABNORMAL LOW (ref >60)
Glucose, Bld: 124 mg/dL — ABNORMAL HIGH (ref 70–99)
Potassium: 4.8 mmol/L (ref 3.5–5.1)
Sodium: 140 mmol/L (ref 135–145)
Total Bilirubin: 0.3 mg/dL (ref 0.0–1.2)
Total Protein: 7.3 g/dL (ref 6.5–8.1)

## 2023-07-13 LAB — CBC WITH DIFFERENTIAL/PLATELET
Abs Immature Granulocytes: 0.1 10*3/uL — ABNORMAL HIGH (ref 0.00–0.07)
Basophils Absolute: 0.1 10*3/uL (ref 0.0–0.1)
Basophils Relative: 0 %
Eosinophils Absolute: 0.4 10*3/uL (ref 0.0–0.5)
Eosinophils Relative: 2 %
HCT: 43.4 % (ref 36.0–46.0)
Hemoglobin: 12.9 g/dL (ref 12.0–15.0)
Immature Granulocytes: 1 %
Lymphocytes Relative: 10 %
Lymphs Abs: 1.8 10*3/uL (ref 0.7–4.0)
MCH: 30.9 pg (ref 26.0–34.0)
MCHC: 29.7 g/dL — ABNORMAL LOW (ref 30.0–36.0)
MCV: 104.1 fL — ABNORMAL HIGH (ref 80.0–100.0)
Monocytes Absolute: 1.1 10*3/uL — ABNORMAL HIGH (ref 0.1–1.0)
Monocytes Relative: 6 %
Neutro Abs: 14.9 10*3/uL — ABNORMAL HIGH (ref 1.7–7.7)
Neutrophils Relative %: 81 %
Platelets: 381 10*3/uL (ref 150–400)
RBC: 4.17 MIL/uL (ref 3.87–5.11)
RDW: 14.6 % (ref 11.5–15.5)
WBC: 18.4 10*3/uL — ABNORMAL HIGH (ref 4.0–10.5)
nRBC: 0 % (ref 0.0–0.2)

## 2023-07-13 LAB — PROTIME-INR
INR: 1.2 (ref 0.8–1.2)
Prothrombin Time: 15.1 s (ref 11.4–15.2)

## 2023-07-13 LAB — I-STAT CG4 LACTIC ACID, ED: Lactic Acid, Venous: 1.3 mmol/L (ref 0.5–1.9)

## 2023-07-13 LAB — C DIFFICILE QUICK SCREEN W PCR REFLEX
C Diff antigen: POSITIVE — AB
C Diff interpretation: DETECTED
C Diff toxin: POSITIVE — AB

## 2023-07-13 LAB — APTT: aPTT: 24 s (ref 24–36)

## 2023-07-13 MED ORDER — SODIUM CHLORIDE 0.9 % IV BOLUS
500.0000 mL | Freq: Once | INTRAVENOUS | Status: AC
  Administered 2023-07-13: 500 mL via INTRAVENOUS

## 2023-07-13 MED ORDER — METRONIDAZOLE 500 MG PO TABS
500.0000 mg | ORAL_TABLET | Freq: Once | ORAL | Status: AC
  Administered 2023-07-13: 500 mg via ORAL
  Filled 2023-07-13: qty 1

## 2023-07-13 MED ORDER — METRONIDAZOLE 500 MG PO TABS
500.0000 mg | ORAL_TABLET | Freq: Three times a day (TID) | ORAL | 0 refills | Status: AC
Start: 2023-07-13 — End: 2023-07-23

## 2023-07-13 MED ORDER — METRONIDAZOLE 500 MG PO TABS: 500.0000 mg | ORAL_TABLET | Freq: Three times a day (TID) | ORAL | 0 refills | Status: DC

## 2023-07-13 NOTE — Discharge Instructions (Addendum)
 Instructions to nursing staff and provider  Dione's infectious workup included CT scan of the chest abdomen pelvis.  The CT scans show signs of inflammation or colitis of the transverse, sigmoid and descending colon.  There was no other obvious inflammatory or infectious source on her workup in the ER today.  This type of colitis can often be infectious.  The differential would include C. difficile or other GI infectious pathogens.  Because she has recently been on several antibiotics, including Rocephin and ertapenem from reviewing her records, I think she is at a higher risk for C. difficile.  However we were not able to get a stool sample from her after 8 hours in the ER.  I do recommend that your staff test her for C. difficile at the first possibility.  I started her on oral Flagyl, 500 mg, 3 times a day for 10 days.  I sent a prescription to her pharmacy and also provided a secondary paper prescription if this is easier to fill tomorrow.  Flagyl can provide cross coverage for C. difficile and other intra-abdominal bacteria -- especially since the patient has had several days of broad antibiotics already.  If C. Diff testing is CONFIRMED at your facility, consider switching treatment to oral Vancomycin or treatment of choice by your provider.  I would recommend discontinuing additional antibiotics unless they are absolutely necessary.  Ayumi was also given 1/2 liter of IV fluids in the ER for mild dehydration on her labs.  Konni's daughter was present in the ER and updated about the treatment plan.  Thank you, [place]

## 2023-07-13 NOTE — ED Triage Notes (Signed)
 Per EMS from [facility]. Labwork had WBCs elevated. Sent for evaluation.  BP 127/84 RR 18 HR 80 O2 94 RA CBG 213

## 2023-07-13 NOTE — ED Notes (Signed)
 Report called to [facility]

## 2023-07-13 NOTE — ED Provider Notes (Signed)
 [place] EMERGENCY DEPARTMENT AT [facility] Provider Note   CSN: [id number] Arrival date & time: 07/13/23  1508     History  Chief Complaint  Patient presents with   Evaluation for elvated WBCs    [name] is a 89 y.o. female presenting from a nursing facility with concern for leukocytosis.  Per report given by EMS and nursing facility, patient has had an uptrending white blood cell count, most recently 18,000, despite several days of IM ertapenem.  Reportedly she did not have signs of infection on her urine or her chest x-ray.  She was sent for further evaluation.  Patient herself denies any significant complaints to me.  She denies abdominal pain or headache.  Denies coughing.  Her daughter at the bedside reports the patient is prone to constipation and is not clear when she had her last bowel movement.  Medical history as notable for type 2 diabetes, hypertension, hyperlipidemia, chronic pancreatic insufficiency, lower extremity DVT, on Eliquis - hospitalized 07/03/22 per review of records - and UTI.    HPI     Home Medications Prior to Admission medications   Medication Sig Start Date End Date Taking? Authorizing Provider  acetaminophen (TYLENOL) 500 MG tablet Take 2 tablets (1,000 mg total) by mouth 3 (three) times daily. Patient taking differently: Take 1,000 mg by mouth 3 (three) times daily as needed for mild pain (pain score 1-3) or headache. 07/03/22  Yes Dahal, Melina Schools, MD  azelastine (ASTELIN) 0.1 % nasal spray Place 1 spray into both nostrils 2 (two) times daily. Use in each nostril as directed Patient taking differently: Place 1 spray into both nostrils 2 (two) times daily. 12/02/21  Yes Crain, Whitney L, PA  bethanechol (URECHOLINE) 5 MG tablet Take 5 mg by mouth 3 (three) times daily.   Yes [provider]  diclofenac Sodium (VOLTAREN) 1 % GEL Apply 2 g topically 4 (four) times daily as needed (for pain- affected areas).   Yes [provider]  diphenhydrAMINE-zinc acetate (BENADRYL ITCH STOPPING) cream Apply 1 application  topically 3 (three) times daily as needed for itching.   Yes [provider]  ELIQUIS 2.5 MG TABS tablet Take 2.5 mg by mouth 2 (two) times daily. 10/09/22  Yes [provider]  fluticasone (FLONASE) 50 MCG/ACT nasal spray PLACE 2 SPRAYS INTO BOTH NOSTRILS DAILY AS NEEDED FOR ALLERGIES. Patient taking differently: Place 2 sprays into both nostrils daily. 06/10/20  Yes Bedsole, Amy E, MD  ketoconazole (NIZORAL) 2 % shampoo Apply 1 Application topically See admin instructions. Shampoo the scalp once a week 09/01/22  Yes [provider]  loratadine (CLARITIN) 10 MG tablet Take 10 mg by mouth daily.   Yes [provider]  memantine (NAMENDA) 10 MG tablet Take 10 mg by mouth 2 (two) times daily.   Yes [provider]  Menthol, Topical Analgesic, (EUCERIN ITCH RELIEF EX) Apply 1 application  topically as needed (for itching- affected areas).   Yes [provider]  Multiple Vitamin (MULTIVITAMIN PO) Take 1 tablet by mouth daily with breakfast.   Yes [provider]  Propylene Glycol (SYSTANE BALANCE) 0.6 % SOLN Place 1-2 drops into both eyes daily.   Yes [provider]  ramipril (ALTACE) 5 MG capsule TAKE 1 CAPSULE BY MOUTH EVERY DAY 12/03/20  Yes Bedsole, Amy E, MD  SALINE NASAL SPRAY NA Place 2 sprays into both nostrils as needed (nasal stuffiness or congestion).   Yes [provider]  triamcinolone  cream (KENALOG) 0.1 % Apply topically to itchy areas at back qd/bid prn. Avoid applying to face, groin, and axilla. Use as directed. Patient taking differently: Apply 1 Application topically See admin instructions. Apply topically to the back 1-2 times a day as needed for itching. Avoid applying to face, groin, and axilla. Use as directed. 02/13/23  Yes [name] Niece, MD  Accu-Chek Softclix Lancets lancets CHECK BLOOD SUGAR 1 TIME A DAY.  DX: E11.9 03/22/20   Bedsole, Amy E, MD  Blood Glucose Monitoring Suppl (ACCU-CHEK GUIDE) w/Device KIT 1 each by Does not apply route daily. Use to check blood sugar daily as needed.  Dx: E11.9 03/22/20   Bedsole, Amy E, MD  glucose blood (ACCU-CHEK GUIDE) test strip Use to check blood sugar 1 time daily 03/22/20   Bedsole, Amy E, MD  metroNIDAZOLE (FLAGYL) 500 MG tablet Take 1 tablet (500 mg total) by mouth 3 (three) times daily for 10 days. 07/13/23 07/23/23  Terald Sleeper, MD  mupirocin ointment Idelle Jo) 2 % Apply once daily with bandage change Patient not taking: Reported on 07/13/2023 11/01/22   [name] Niece, MD      Allergies    Codeine and Statins    Review of Systems   Review of Systems  Physical Exam Updated Vital Signs BP (!) 135/54 (BP Location: Right Arm)   Pulse 88   Temp 97.9 F (36.6 C) (Oral)   Resp (!) 21   SpO2 98%  Physical Exam Constitutional:      General: She is not in acute distress. HENT:     Head: Normocephalic and atraumatic.  Eyes:     Conjunctiva/sclera: Conjunctivae normal.     Pupils: Pupils are equal, round, and reactive to light.  Cardiovascular:     Rate and Rhythm: Normal rate and regular rhythm.  Pulmonary:     Effort: Pulmonary effort is normal. No respiratory distress.  Abdominal:     General: There is no distension.     Tenderness: There is no abdominal tenderness.  Skin:    General: Skin is warm and dry.  Neurological:     General: No focal deficit present.     Mental Status: She is alert. Mental status is at baseline.  Psychiatric:        Mood and Affect: Mood normal.        Behavior: Behavior normal.     ED Results / Procedures / Treatments   Labs (all labs ordered are listed, but only abnormal results are displayed) Labs Reviewed  COMPREHENSIVE METABOLIC PANEL - Abnormal; Notable for the following components:      Result Value   CO2 19 (*)    Glucose, Bld 124 (*)    BUN 62 (*)    Creatinine, Ser 1.69 (*)     Albumin 3.3 (*)    GFR, Estimated 27 (*)    All other components within normal limits  CBC WITH DIFFERENTIAL/PLATELET - Abnormal; Notable for the following components:   WBC 18.4 (*)    MCV 104.1 (*)    MCHC 29.7 (*)    Neutro Abs 14.9 (*)    Monocytes Absolute 1.1 (*)    Abs Immature Granulocytes 0.10 (*)    All other components within normal limits  CULTURE, BLOOD (SINGLE)  C DIFFICILE QUICK SCREEN W PCR REFLEX    GASTROINTESTINAL PANEL BY PCR, STOOL (REPLACES STOOL CULTURE)  PROTIME-INR  APTT  URINALYSIS, W/ REFLEX TO CULTURE (INFECTION SUSPECTED)  I-STAT CG4 LACTIC ACID, ED  I-STAT  CG4 LACTIC ACID, ED    EKG EKG Interpretation Date/Time:  Friday July 13 2023 15:35:11 EDT Ventricular Rate:  82 PR Interval:  169 QRS Duration:  91 QT Interval:  376 QTC Calculation: 440 R Axis:   -44  Text Interpretation: Sinus rhythm LVH with secondary repolarization abnormality Inferior infarct, old Anterior infarct, old Confirmed by Alvester Chou [id number]) on 07/13/2023 3:53:56 PM  Radiology CT CHEST ABDOMEN PELVIS WO CONTRAST Result Date: 07/13/2023 CLINICAL DATA:  Fever and leukocytosis of unknown origin.  Sepsis. EXAM: CT CHEST, ABDOMEN AND PELVIS WITHOUT CONTRAST TECHNIQUE: Multidetector CT imaging of the chest, abdomen and pelvis was performed following the standard protocol without IV contrast. RADIATION DOSE REDUCTION: This exam was performed according to the departmental dose-optimization program which includes automated exposure control, adjustment of the mA and/or kV according to patient size and/or use of iterative reconstruction technique. COMPARISON:  AP only CT on 02/23/2020 FINDINGS: CT CHEST FINDINGS Cardiovascular: No acute findings. Mediastinum/Lymph Nodes: No masses or pathologically enlarged lymph nodes identified on this unenhanced exam. Lungs/Pleura: No evidence of infiltrate, mass, or pleural effusion. Mild bibasilar pleural-parenchymal scarring noted. Musculoskeletal: No  suspicious bone lesions identified. Severe bilateral glenohumeral arthritis and acromioclavicular degenerative changes. CT ABDOMEN AND PELVIS FINDINGS Hepatobiliary: No masses visualized on this unenhanced exam. Gallbladder is unremarkable. No evidence of biliary ductal dilatation. Pancreas: No mass or inflammatory changes identified on this unenhanced exam. Spleen:  Within normal limits in size. Adrenals/Urinary Tract: No evidence of urolithiasis or hydronephrosis. Bilateral parapelvic renal cysts again noted. Unremarkable appearance of bladder. Stomach/Bowel: No evidence of bowel obstruction. Colonic diverticulosis is seen. Long segment mild-to-moderate wall thickening is seen throughout the transverse, descending, and sigmoid colon, and rectum, consistent with colitis. No No evidence of pneumatosis, perforation, or abscess. Vascular/Lymphatic: No pathologically enlarged lymph nodes identified. No abdominal aortic aneurysm. Reproductive: Prior hysterectomy noted. Stable 2 cm simple benign-appearing left adnexal cyst since 2021 exam (No followup imaging is recommended). Other:  None. Musculoskeletal: No suspicious bone lesions identified. Moderate left hip osteoarthritis and right hip prosthesis noted. Advanced lumbar spine degenerative changes also noted. IMPRESSION: Moderate colitis throughout the transverse, descending, and sigmoid colon, and rectum. No evidence of perforation or abscess. No acute findings or active disease within the thorax. Electronically Signed   By: Danae Orleans M.D.   On: 07/13/2023 18:29   DG Chest Port 1 View Result Date: 07/13/2023 CLINICAL DATA:  Sepsis. EXAM: PORTABLE CHEST 1 VIEW COMPARISON:  02/24/2022 FINDINGS: The heart size and mediastinal contours are within normal limits. Both lungs are clear. Severe osteoarthritis is seen involving both shoulders. IMPRESSION: No active disease. Electronically Signed   By: Danae Orleans M.D.   On: 07/13/2023 18:14    Procedures Procedures     Medications Ordered in ED Medications  sodium chloride 0.9 % bolus 500 mL (0 mLs Intravenous Stopped 07/13/23 2111)  metroNIDAZOLE (FLAGYL) tablet 500 mg (500 mg Oral Given 07/13/23 2210)    ED Course/ Medical Decision Making/ A&P Clinical Course as of 07/13/23 2311  Fri Jul 13, 2023  1957 Patient has clinical picture and most likely suspect that the colitis presentation is infectious in etiology.  She has been on stronger broad-spectrum antibiotics with ertapenem, which raises the risk of C. difficile.  She is not having any abdominal pain and has not had any diarrhea since her arrival in the ED, we will try to collect a stool sample.  Patient's daughter was updated as well at the bedside [MT]  2117 Still awaiting  attempt to produce stool - if unsuccessful we will likely start flagyl treatment for potential C. Diff vs other intraabdominal infection [MT]    Clinical Course User Index [MT] Gagandeep Kossman, Kermit Balo, MD                                 Medical Decision Making Amount and/or Complexity of Data Reviewed Labs: ordered. Radiology: ordered.  Risk Prescription drug management.   This patient presents to the ED with concern for leukocytosis. This involves an extensive number of treatment options, and is a complaint that carries with it a high risk of complications and morbidity.  The differential diagnosis includes infection versus malignancy vs other  Co-morbidities that complicate the patient evaluation: advanced age, recurring UTI  Additional history obtained from EMS, daughter  External records from outside source obtained and reviewed including hospital records from 2024  I ordered and personally interpreted labs.  The pertinent results include:  White blood cell count elevated 18.4 unchanged from yesterday; CMP with BUN 62, Cr 1.69.  UA at facility reviewed without sign of UTI.  Lactate wnl.  I ordered imaging studies including CT chest abdomen pelvis I independently  visualized and interpreted imaging which showed concern for colitis without evidence of perforation I agree with the radiologist interpretation  The patient was maintained on a cardiac monitor.  I personally viewed and interpreted the cardiac monitored which showed an underlying rhythm of: Sinus rhythm  Per my interpretation the patient's ECG shows sinus rhythm no acute ischemic findings  I ordered medication including 500 cc fluid bolus for hydration, oral Flagyl for potential intra-abdominal infection  I have reviewed the patients home medicines and have made adjustments as needed  Test Considered: I have a low clinical suspicion for mesenteric ischemia.  The patient does not have any abdominal pain or tenderness, and she has a normal lactate level.  I do not believe she is requiring a CT angiogram.  After the interventions noted above, I reevaluated the patient and found that they have: stayed the same  Likely infectious colitis with this clinical picture.  Given the patient's broad-spectrum recent antibiotics including Rocephin earlier this month and then 5 days of IM ertapenem, this would raise concern for C. difficile.  Surprisingly she has not had diarrhea or loose BM here in the ED.  We are attempting to collect C Diff and GI pathogen panel but have not had luck so far.  I am inclined to start on empiric treatment with Flagyl 500 mg 3 times daily for 10 days, as this would also provide cross coverage for other intra-abdominal pathogens.  I would not add ciprofloxacin or fluoroquinolone until there is a more definitive result, but I would advise they hold her additional empiric BS antibiotics at the facility.  Awaiting PTAR at end of shift  Dispostion:  After consideration of the diagnostic results and the patients response to treatment, I feel that the patent would benefit from outpatient follow up         Final Clinical Impression(s) / ED Diagnoses Final diagnoses:  Colitis     Rx / DC Orders ED Discharge Orders          Ordered    metroNIDAZOLE (FLAGYL) 500 MG tablet  3 times daily,   Status:  Discontinued        07/13/23 2151    metroNIDAZOLE (FLAGYL) 500 MG tablet  3 times daily  07/13/23 2152              Terald Sleeper, MD 07/13/23 2311

## 2023-07-14 LAB — GASTROINTESTINAL PANEL BY PCR, STOOL (REPLACES STOOL CULTURE)

## 2023-07-16 ENCOUNTER — Ambulatory Visit: Payer: Medicare Other | Admitting: Podiatry

## 2023-07-18 LAB — CULTURE, BLOOD (SINGLE)
Culture: NO GROWTH
Special Requests: ADEQUATE

## 2023-08-14 ENCOUNTER — Ambulatory Visit: Payer: Medicare Other | Admitting: Dermatology

## 2023-08-22 ENCOUNTER — Ambulatory Visit: Admitting: Podiatry

## 2023-11-21 ENCOUNTER — Encounter: Payer: Self-pay | Admitting: Podiatry

## 2023-11-21 ENCOUNTER — Ambulatory Visit (INDEPENDENT_AMBULATORY_CARE_PROVIDER_SITE_OTHER): Admitting: Podiatry

## 2023-11-21 DIAGNOSIS — B351 Tinea unguium: Secondary | ICD-10-CM

## 2023-11-21 DIAGNOSIS — M79674 Pain in right toe(s): Secondary | ICD-10-CM | POA: Diagnosis not present

## 2023-11-21 DIAGNOSIS — E119 Type 2 diabetes mellitus without complications: Secondary | ICD-10-CM

## 2023-11-21 DIAGNOSIS — M79675 Pain in left toe(s): Secondary | ICD-10-CM | POA: Diagnosis not present

## 2023-11-21 NOTE — Progress Notes (Addendum)
 This patient returns to my office for at risk foot care.  This patient requires this care by a professional since this patient will be at risk due to having diabetic neuropathy. This patient is unable to cut nails herself since the patient cannot reach her nails.These nails are painful walking and wearing shoes. She presents to the office with her daughter in a wheelchair.  General Appearance  Alert, conversant and in no acute stress.  Vascular  Dorsalis pedis and posterior tibial  pulses are  weakly palpable  bilaterally.  Capillary return is within normal limits  bilaterally. Temperature is within normal limits  bilaterally.  Neurologic  Senn-Weinstein monofilament wire test within normal limits  bilaterally. Muscle power within normal limits bilaterally.  Nails Thick disfigured discolored nails with subungual debris  from hallux to fifth toes bilaterally. No evidence of bacterial infection or drainage bilaterally.  Orthopedic  No limitations of motion  feet .  No crepitus or effusions noted.  Hammer toes  B/L.  Skin  normotropic skin with no porokeratosis noted bilaterally.  No signs of infections or ulcers noted.     Onychomycosis  Pain in right toes  Pain in left toes  Consent was obtained for treatment procedures.   Mechanical debridement of nails 1-5  bilaterally performed with a nail nipper.  Filed with dremel without incident.  Padding dispensed for second toe hammer toes.   Return office visit      4 months                Told patient to return for periodic foot care and evaluation due to potential at risk complications.   Cordella Bold DPM

## 2024-03-17 ENCOUNTER — Ambulatory Visit: Admitting: Podiatry

## 2024-04-02 ENCOUNTER — Ambulatory Visit: Admitting: Podiatry
